# Patient Record
Sex: Male | Born: 1940 | Race: White | Hispanic: No | Marital: Married | Smoking: Current every day smoker
Health system: Southern US, Community
[De-identification: ages and names within clinical notes are randomized; demographics above are authoritative.]

## PROBLEM LIST (undated history)

## (undated) DIAGNOSIS — J984 Other disorders of lung: Secondary | ICD-10-CM

## (undated) DIAGNOSIS — Z720 Tobacco use: Secondary | ICD-10-CM

## (undated) DIAGNOSIS — S46009A Unspecified injury of muscle(s) and tendon(s) of the rotator cuff of unspecified shoulder, initial encounter: Secondary | ICD-10-CM

## (undated) DIAGNOSIS — M25519 Pain in unspecified shoulder: Secondary | ICD-10-CM

## (undated) DIAGNOSIS — L219 Seborrheic dermatitis, unspecified: Secondary | ICD-10-CM

## (undated) DIAGNOSIS — E875 Hyperkalemia: Secondary | ICD-10-CM

## (undated) DIAGNOSIS — N200 Calculus of kidney: Secondary | ICD-10-CM

## (undated) DIAGNOSIS — I10 Essential (primary) hypertension: Secondary | ICD-10-CM

## (undated) DIAGNOSIS — N529 Male erectile dysfunction, unspecified: Secondary | ICD-10-CM

## (undated) DIAGNOSIS — E785 Hyperlipidemia, unspecified: Secondary | ICD-10-CM

## (undated) DIAGNOSIS — J449 Chronic obstructive pulmonary disease, unspecified: Secondary | ICD-10-CM

## (undated) DIAGNOSIS — R911 Solitary pulmonary nodule: Secondary | ICD-10-CM

## (undated) DIAGNOSIS — M7500 Adhesive capsulitis of unspecified shoulder: Secondary | ICD-10-CM

## (undated) DIAGNOSIS — E119 Type 2 diabetes mellitus without complications: Secondary | ICD-10-CM

## (undated) DIAGNOSIS — I639 Cerebral infarction, unspecified: Secondary | ICD-10-CM

## (undated) HISTORY — DX: Hyperlipidemia, unspecified: E78.5

## (undated) HISTORY — PX: OTHER SURGICAL HISTORY: SHX169

## (undated) HISTORY — DX: Calculus of kidney: N20.0

## (undated) HISTORY — DX: Unspecified injury of muscle(s) and tendon(s) of the rotator cuff of unspecified shoulder, initial encounter: S46.009A

## (undated) HISTORY — DX: Solitary pulmonary nodule: R91.1

## (undated) HISTORY — DX: Type 2 diabetes mellitus without complications: E11.9

## (undated) HISTORY — DX: Seborrheic dermatitis, unspecified: L21.9

## (undated) HISTORY — DX: Chronic obstructive pulmonary disease, unspecified: J44.9

## (undated) HISTORY — DX: Adhesive capsulitis of unspecified shoulder: M75.00

## (undated) HISTORY — DX: Hyperkalemia: E87.5

## (undated) HISTORY — DX: Essential (primary) hypertension: I10

## (undated) HISTORY — DX: Other disorders of lung: J98.4

## (undated) HISTORY — DX: Male erectile dysfunction, unspecified: N52.9

## (undated) HISTORY — DX: Tobacco use: Z72.0

## (undated) HISTORY — DX: Pain in unspecified shoulder: M25.519

---

## 1994-11-28 HISTORY — PX: OTHER SURGICAL HISTORY: SHX169

## 1996-09-29 HISTORY — PX: CERVICAL DISCECTOMY: SHX98

## 2003-09-27 ENCOUNTER — Emergency Department (HOSPITAL_COMMUNITY): Admission: EM | Admit: 2003-09-27 | Discharge: 2003-09-28 | Payer: Self-pay | Admitting: Emergency Medicine

## 2005-01-29 ENCOUNTER — Ambulatory Visit: Payer: Self-pay | Admitting: Family Medicine

## 2005-02-14 ENCOUNTER — Ambulatory Visit: Payer: Self-pay | Admitting: Family Medicine

## 2006-05-27 ENCOUNTER — Ambulatory Visit: Payer: Self-pay | Admitting: Family Medicine

## 2006-06-10 ENCOUNTER — Ambulatory Visit: Payer: Self-pay | Admitting: Family Medicine

## 2006-06-24 ENCOUNTER — Ambulatory Visit: Payer: Self-pay | Admitting: Family Medicine

## 2006-06-25 ENCOUNTER — Ambulatory Visit: Payer: Self-pay | Admitting: Cardiology

## 2006-07-27 ENCOUNTER — Ambulatory Visit: Payer: Self-pay | Admitting: Family Medicine

## 2006-08-28 ENCOUNTER — Ambulatory Visit: Payer: Self-pay | Admitting: Family Medicine

## 2006-09-08 ENCOUNTER — Ambulatory Visit: Payer: Self-pay | Admitting: Family Medicine

## 2006-11-24 ENCOUNTER — Ambulatory Visit: Payer: Self-pay | Admitting: Family Medicine

## 2006-11-24 LAB — CONVERTED CEMR LAB
ALT: 18 units/L (ref 0–40)
AST: 20 units/L (ref 0–37)
Albumin: 4.4 g/dL (ref 3.5–5.2)
BUN: 13 mg/dL (ref 6–23)
CO2: 29 meq/L (ref 19–32)
Calcium: 9.4 mg/dL (ref 8.4–10.5)
Chloride: 98 meq/L (ref 96–112)
Cholesterol: 144 mg/dL (ref 0–200)
Creatinine, Ser: 1 mg/dL (ref 0.4–1.5)
GFR calc Af Amer: 96 mL/min
GFR calc non Af Amer: 79 mL/min
Glucose, Bld: 167 mg/dL — ABNORMAL HIGH (ref 70–99)
HDL: 45.8 mg/dL (ref >39.0)
Hgb A1c MFr Bld: 8.2 %
Hgb A1c MFr Bld: 8.2 % — ABNORMAL HIGH (ref 4.6–6.0)
LDL Cholesterol: 61 mg/dL (ref 0–99)
Phosphorus: 3 mg/dL (ref 2.3–4.6)
Potassium: 4.6 meq/L (ref 3.5–5.1)
Sodium: 138 meq/L (ref 135–145)
Total CHOL/HDL Ratio: 3.1
Triglycerides: 188 mg/dL — ABNORMAL HIGH (ref 0–149)
VLDL: 38 mg/dL (ref 0–40)

## 2007-01-05 ENCOUNTER — Encounter: Payer: Self-pay | Admitting: Family Medicine

## 2007-01-05 DIAGNOSIS — I10 Essential (primary) hypertension: Secondary | ICD-10-CM | POA: Insufficient documentation

## 2007-01-05 DIAGNOSIS — F528 Other sexual dysfunction not due to a substance or known physiological condition: Secondary | ICD-10-CM | POA: Insufficient documentation

## 2007-01-26 ENCOUNTER — Ambulatory Visit: Payer: Self-pay | Admitting: Family Medicine

## 2007-02-25 ENCOUNTER — Ambulatory Visit: Payer: Self-pay | Admitting: Family Medicine

## 2007-02-26 LAB — CONVERTED CEMR LAB
ALT: 20 units/L (ref 0–40)
AST: 22 units/L (ref 0–37)
Cholesterol: 144 mg/dL (ref 0–200)
Creatinine, Ser: 1 mg/dL (ref 0.4–1.5)
Direct LDL: 70.6 mg/dL
HDL: 37.9 mg/dL — ABNORMAL LOW (ref >39.0)
Hgb A1c MFr Bld: 7.6 % — ABNORMAL HIGH (ref 4.6–6.0)
Total CHOL/HDL Ratio: 3.8
Triglycerides: 229 mg/dL (ref 0–149)
VLDL: 46 mg/dL — ABNORMAL HIGH (ref 0–40)

## 2007-05-28 ENCOUNTER — Ambulatory Visit: Payer: Self-pay | Admitting: Family Medicine

## 2007-06-01 LAB — CONVERTED CEMR LAB
Albumin: 4.9 g/dL (ref 3.5–5.2)
BUN: 15 mg/dL (ref 6–23)
CO2: 24 meq/L (ref 19–32)
Calcium: 10 mg/dL (ref 8.4–10.5)
Chloride: 95 meq/L — ABNORMAL LOW (ref 96–112)
Creatinine, Ser: 0.98 mg/dL (ref 0.40–1.50)
Glucose, Bld: 110 mg/dL — ABNORMAL HIGH (ref 70–99)
Hgb A1c MFr Bld: 7 % — ABNORMAL HIGH (ref 4.6–6.1)
Phosphorus: 2.8 mg/dL (ref 2.3–4.6)
Potassium: 4.5 meq/L (ref 3.5–5.3)
Sodium: 135 meq/L (ref 135–145)

## 2007-08-30 ENCOUNTER — Ambulatory Visit: Payer: Self-pay | Admitting: Internal Medicine

## 2007-08-30 DIAGNOSIS — N209 Urinary calculus, unspecified: Secondary | ICD-10-CM | POA: Insufficient documentation

## 2007-08-30 LAB — CONVERTED CEMR LAB
Bilirubin Urine: NEGATIVE
Glucose, Urine, Semiquant: NEGATIVE
Nitrite: NEGATIVE
Protein, U semiquant: 30
Specific Gravity, Urine: >=1.03
Urobilinogen, UA: 0.2
WBC Urine, dipstick: NEGATIVE
pH: 5

## 2007-08-31 ENCOUNTER — Encounter: Payer: Self-pay | Admitting: Internal Medicine

## 2007-09-02 ENCOUNTER — Ambulatory Visit: Payer: Self-pay | Admitting: Cardiovascular Disease

## 2007-09-02 ENCOUNTER — Ambulatory Visit: Payer: Self-pay | Admitting: Family Medicine

## 2007-09-02 ENCOUNTER — Telehealth: Payer: Self-pay | Admitting: Family Medicine

## 2007-09-03 ENCOUNTER — Encounter: Payer: Self-pay | Admitting: Family Medicine

## 2007-09-07 ENCOUNTER — Encounter: Payer: Self-pay | Admitting: Family Medicine

## 2007-09-08 ENCOUNTER — Ambulatory Visit (HOSPITAL_COMMUNITY): Admission: RE | Admit: 2007-09-08 | Discharge: 2007-09-08 | Payer: Self-pay | Admitting: Urology

## 2007-09-22 ENCOUNTER — Ambulatory Visit (HOSPITAL_COMMUNITY): Admission: RE | Admit: 2007-09-22 | Discharge: 2007-09-22 | Payer: Self-pay | Admitting: Urology

## 2007-10-28 ENCOUNTER — Telehealth: Payer: Self-pay | Admitting: Family Medicine

## 2008-01-20 ENCOUNTER — Encounter: Payer: Self-pay | Admitting: Family Medicine

## 2008-01-21 ENCOUNTER — Encounter: Payer: Self-pay | Admitting: Family Medicine

## 2008-04-14 ENCOUNTER — Ambulatory Visit: Payer: Self-pay | Admitting: Family Medicine

## 2008-04-14 DIAGNOSIS — L219 Seborrheic dermatitis, unspecified: Secondary | ICD-10-CM | POA: Insufficient documentation

## 2008-04-17 ENCOUNTER — Encounter: Payer: Self-pay | Admitting: Family Medicine

## 2008-04-18 LAB — CONVERTED CEMR LAB
ALT: 17 units/L (ref 0–53)
AST: 18 units/L (ref 0–37)
Albumin: 4.8 g/dL (ref 3.5–5.2)
Alkaline Phosphatase: 69 units/L (ref 39–117)
BUN: 13 mg/dL (ref 6–23)
Basophils Absolute: 0 10*3/uL (ref 0.0–0.1)
Basophils Relative: 0 % (ref 0–1)
Bilirubin, Direct: 0.3 mg/dL (ref 0.0–0.3)
CO2: 21 meq/L (ref 19–32)
Calcium: 9.5 mg/dL (ref 8.4–10.5)
Chloride: 101 meq/L (ref 96–112)
Cholesterol: 142 mg/dL (ref 0–200)
Creatinine, Ser: 0.95 mg/dL (ref 0.40–1.50)
Eosinophils Absolute: 0.2 10*3/uL (ref 0.0–0.7)
Eosinophils Relative: 2 % (ref 0–5)
Glucose, Bld: 152 mg/dL — ABNORMAL HIGH (ref 70–99)
HCT: 49.9 % (ref 39.0–52.0)
HDL: 33.4 mg/dL — ABNORMAL LOW (ref >39.0)
Hemoglobin: 17.1 g/dL — ABNORMAL HIGH (ref 13.0–17.0)
Hgb A1c MFr Bld: 7.7 % — ABNORMAL HIGH (ref 4.6–6.0)
Indirect Bilirubin: 1.2 mg/dL — ABNORMAL HIGH (ref 0.0–0.9)
LDL Cholesterol: 72 mg/dL (ref 0–99)
Lymphocytes Relative: 20 % (ref 12–46)
Lymphs Abs: 1.9 10*3/uL (ref 0.7–4.0)
MCHC: 34.3 g/dL (ref 30.0–36.0)
MCV: 88 fL (ref 78.0–100.0)
Monocytes Absolute: 0.7 10*3/uL (ref 0.1–1.0)
Monocytes Relative: 7 % (ref 3–12)
Neutro Abs: 6.4 10*3/uL (ref 1.7–7.7)
Neutrophils Relative %: 70 % (ref 43–77)
Platelets: 221 10*3/uL (ref 150–400)
Potassium: 4.4 meq/L (ref 3.5–5.3)
RBC: 5.67 M/uL (ref 4.22–5.81)
RDW: 13.2 % (ref 11.5–15.5)
Sodium: 140 meq/L (ref 135–145)
TSH: 2.182 microintl units/mL (ref 0.350–4.50)
Total Bilirubin: 1.5 mg/dL — ABNORMAL HIGH (ref 0.3–1.2)
Total CHOL/HDL Ratio: 4.3
Total Protein: 7.5 g/dL (ref 6.0–8.3)
Triglycerides: 184 mg/dL — ABNORMAL HIGH (ref 0–149)
VLDL: 37 mg/dL (ref 0–40)
WBC: 9.1 10*3/uL (ref 4.0–10.5)

## 2008-05-18 ENCOUNTER — Ambulatory Visit: Payer: Self-pay | Admitting: Family Medicine

## 2008-09-27 ENCOUNTER — Telehealth: Payer: Self-pay | Admitting: Family Medicine

## 2008-11-06 ENCOUNTER — Telehealth: Payer: Self-pay | Admitting: Family Medicine

## 2008-11-15 ENCOUNTER — Telehealth: Payer: Self-pay | Admitting: Family Medicine

## 2008-11-16 ENCOUNTER — Ambulatory Visit: Payer: Self-pay | Admitting: Family Medicine

## 2008-11-21 LAB — CONVERTED CEMR LAB
ALT: 21 units/L (ref 0–53)
AST: 24 units/L (ref 0–37)
Albumin: 4.8 g/dL (ref 3.5–5.2)
Alkaline Phosphatase: 84 units/L (ref 39–117)
BUN: 16 mg/dL (ref 6–23)
Bilirubin, Direct: 0.3 mg/dL (ref 0.0–0.3)
CO2: 31 meq/L (ref 19–32)
Calcium: 10.2 mg/dL (ref 8.4–10.5)
Chloride: 97 meq/L (ref 96–112)
Creatinine, Ser: 1.2 mg/dL (ref 0.4–1.5)
GFR calc Af Amer: 77 mL/min
GFR calc non Af Amer: 64 mL/min
Glucose, Bld: 188 mg/dL — ABNORMAL HIGH (ref 70–99)
Hgb A1c MFr Bld: 7.3 % — ABNORMAL HIGH (ref 4.6–6.0)
Phosphorus: 3.7 mg/dL (ref 2.3–4.6)
Potassium: 5.4 meq/L — ABNORMAL HIGH (ref 3.5–5.1)
Sodium: 141 meq/L (ref 135–145)
Total Bilirubin: 2 mg/dL — ABNORMAL HIGH (ref 0.3–1.2)
Total Protein: 8.1 g/dL (ref 6.0–8.3)

## 2008-12-14 ENCOUNTER — Ambulatory Visit: Payer: Self-pay | Admitting: Family Medicine

## 2008-12-14 DIAGNOSIS — E875 Hyperkalemia: Secondary | ICD-10-CM | POA: Insufficient documentation

## 2008-12-15 LAB — CONVERTED CEMR LAB
ALT: 18 units/L (ref 0–53)
AST: 23 units/L (ref 0–37)
Albumin: 4.3 g/dL (ref 3.5–5.2)
Alkaline Phosphatase: 71 units/L (ref 39–117)
Bilirubin, Direct: 0.2 mg/dL (ref 0.0–0.3)
Potassium: 4.8 meq/L (ref 3.5–5.1)
Total Bilirubin: 1.2 mg/dL (ref 0.3–1.2)
Total Protein: 7 g/dL (ref 6.0–8.3)

## 2009-01-04 ENCOUNTER — Encounter: Payer: Self-pay | Admitting: Family Medicine

## 2009-01-18 ENCOUNTER — Ambulatory Visit (HOSPITAL_COMMUNITY): Admission: RE | Admit: 2009-01-18 | Discharge: 2009-01-18 | Payer: Self-pay | Admitting: Urology

## 2009-02-13 ENCOUNTER — Ambulatory Visit: Payer: Self-pay | Admitting: Family Medicine

## 2009-02-15 LAB — CONVERTED CEMR LAB
Creatinine,U: 287.3 mg/dL
Hgb A1c MFr Bld: 7.2 % — ABNORMAL HIGH (ref 4.6–6.5)
Microalb Creat Ratio: 22.6 mg/g (ref 0.0–30.0)
Microalb, Ur: 6.5 mg/dL — ABNORMAL HIGH (ref 0.0–1.9)
Potassium: 4.3 meq/L (ref 3.5–5.1)

## 2009-05-15 ENCOUNTER — Encounter: Payer: Self-pay | Admitting: Family Medicine

## 2009-05-18 ENCOUNTER — Ambulatory Visit (HOSPITAL_BASED_OUTPATIENT_CLINIC_OR_DEPARTMENT_OTHER): Admission: RE | Admit: 2009-05-18 | Discharge: 2009-05-18 | Payer: Self-pay | Admitting: Urology

## 2009-05-25 ENCOUNTER — Ambulatory Visit (HOSPITAL_COMMUNITY): Admission: RE | Admit: 2009-05-25 | Discharge: 2009-05-25 | Payer: Self-pay | Admitting: Urology

## 2009-09-18 ENCOUNTER — Ambulatory Visit: Payer: Self-pay | Admitting: Family Medicine

## 2009-09-19 ENCOUNTER — Ambulatory Visit: Payer: Self-pay | Admitting: Family Medicine

## 2009-09-24 LAB — CONVERTED CEMR LAB
ALT: 16 units/L (ref 0–53)
AST: 19 units/L (ref 0–37)
Albumin: 4.4 g/dL (ref 3.5–5.2)
BUN: 14 mg/dL (ref 6–23)
CO2: 28 meq/L (ref 19–32)
Calcium: 9.1 mg/dL (ref 8.4–10.5)
Chloride: 102 meq/L (ref 96–112)
Cholesterol: 147 mg/dL (ref 0–200)
Creatinine, Ser: 1 mg/dL (ref 0.4–1.5)
Direct LDL: 76.3 mg/dL
GFR calc non Af Amer: 78.69 mL/min (ref >60)
Glucose, Bld: 141 mg/dL — ABNORMAL HIGH (ref 70–99)
HDL: 38 mg/dL — ABNORMAL LOW (ref >39.00)
Hgb A1c MFr Bld: 7.3 % — ABNORMAL HIGH (ref 4.6–6.5)
Phosphorus: 3.3 mg/dL (ref 2.3–4.6)
Potassium: 4.6 meq/L (ref 3.5–5.1)
Sodium: 138 meq/L (ref 135–145)
Total CHOL/HDL Ratio: 4
Triglycerides: 248 mg/dL — ABNORMAL HIGH (ref 0.0–149.0)
VLDL: 49.6 mg/dL — ABNORMAL HIGH (ref 0.0–40.0)

## 2009-10-08 ENCOUNTER — Encounter: Payer: Self-pay | Admitting: Family Medicine

## 2009-11-01 ENCOUNTER — Ambulatory Visit: Payer: Self-pay | Admitting: Family Medicine

## 2009-11-26 ENCOUNTER — Encounter: Payer: Self-pay | Admitting: Family Medicine

## 2009-11-27 LAB — HM DIABETES EYE EXAM: HM Diabetic Eye Exam: NORMAL

## 2009-12-17 ENCOUNTER — Ambulatory Visit: Payer: Self-pay | Admitting: Family Medicine

## 2009-12-17 LAB — CONVERTED CEMR LAB
ALT: 20 units/L (ref 0–53)
AST: 19 units/L (ref 0–37)
Albumin: 4.3 g/dL (ref 3.5–5.2)
Alkaline Phosphatase: 61 units/L (ref 39–117)
BUN: 11 mg/dL (ref 6–23)
Bilirubin, Direct: 0.2 mg/dL (ref 0.0–0.3)
CO2: 32 meq/L (ref 19–32)
Calcium: 9.2 mg/dL (ref 8.4–10.5)
Chloride: 101 meq/L (ref 96–112)
Creatinine, Ser: 0.9 mg/dL (ref 0.4–1.5)
Creatinine,U: 249.3 mg/dL
GFR calc non Af Amer: 88.8 mL/min (ref >60)
Glucose, Bld: 117 mg/dL — ABNORMAL HIGH (ref 70–99)
Hgb A1c MFr Bld: 7.2 % — ABNORMAL HIGH (ref 4.6–6.5)
Microalb Creat Ratio: 25.3 mg/g (ref 0.0–30.0)
Microalb, Ur: 6.3 mg/dL — ABNORMAL HIGH (ref 0.0–1.9)
Phosphorus: 3.5 mg/dL (ref 2.3–4.6)
Potassium: 4.6 meq/L (ref 3.5–5.1)
Sodium: 141 meq/L (ref 135–145)
Total Bilirubin: 1.1 mg/dL (ref 0.3–1.2)
Total Protein: 7.4 g/dL (ref 6.0–8.3)

## 2009-12-19 ENCOUNTER — Ambulatory Visit: Payer: Self-pay | Admitting: Family Medicine

## 2009-12-31 ENCOUNTER — Ambulatory Visit: Payer: Self-pay | Admitting: Family Medicine

## 2010-01-07 ENCOUNTER — Encounter: Payer: Self-pay | Admitting: Family Medicine

## 2010-01-08 ENCOUNTER — Encounter: Admission: RE | Admit: 2010-01-08 | Discharge: 2010-01-08 | Payer: Self-pay | Admitting: Family Medicine

## 2010-01-16 ENCOUNTER — Encounter: Payer: Self-pay | Admitting: Family Medicine

## 2010-02-13 ENCOUNTER — Encounter: Payer: Self-pay | Admitting: Family Medicine

## 2010-03-01 ENCOUNTER — Ambulatory Visit: Payer: Self-pay | Admitting: Family Medicine

## 2010-03-05 LAB — CONVERTED CEMR LAB
ALT: 25 units/L (ref 0–53)
AST: 25 units/L (ref 0–37)
Albumin: 4.3 g/dL (ref 3.5–5.2)
BUN: 19 mg/dL (ref 6–23)
CO2: 31 meq/L (ref 19–32)
Calcium: 9.7 mg/dL (ref 8.4–10.5)
Chloride: 95 meq/L — ABNORMAL LOW (ref 96–112)
Cholesterol: 153 mg/dL (ref 0–200)
Creatinine, Ser: 1 mg/dL (ref 0.4–1.5)
Direct LDL: 81.4 mg/dL
GFR calc non Af Amer: 81.4 mL/min (ref >60)
Glucose, Bld: 168 mg/dL — ABNORMAL HIGH (ref 70–99)
HDL: 43.6 mg/dL (ref >39.00)
Hgb A1c MFr Bld: 7.3 % — ABNORMAL HIGH (ref 4.6–6.5)
Phosphorus: 3.6 mg/dL (ref 2.3–4.6)
Potassium: 4.9 meq/L (ref 3.5–5.1)
Sodium: 138 meq/L (ref 135–145)
Total CHOL/HDL Ratio: 4
Triglycerides: 240 mg/dL — ABNORMAL HIGH (ref 0.0–149.0)
VLDL: 48 mg/dL — ABNORMAL HIGH (ref 0.0–40.0)

## 2010-03-13 ENCOUNTER — Encounter: Payer: Self-pay | Admitting: Family Medicine

## 2010-03-28 ENCOUNTER — Encounter: Payer: Self-pay | Admitting: Family Medicine

## 2010-03-29 ENCOUNTER — Telehealth: Payer: Self-pay | Admitting: Family Medicine

## 2010-03-29 DIAGNOSIS — J449 Chronic obstructive pulmonary disease, unspecified: Secondary | ICD-10-CM | POA: Insufficient documentation

## 2010-04-19 ENCOUNTER — Encounter: Payer: Self-pay | Admitting: Family Medicine

## 2010-04-19 ENCOUNTER — Ambulatory Visit: Payer: Self-pay | Admitting: Pulmonary Disease

## 2010-04-23 DIAGNOSIS — J984 Other disorders of lung: Secondary | ICD-10-CM | POA: Insufficient documentation

## 2010-04-25 ENCOUNTER — Encounter: Payer: Self-pay | Admitting: Pulmonary Disease

## 2010-04-29 ENCOUNTER — Ambulatory Visit: Payer: Self-pay | Admitting: Cardiovascular Disease

## 2010-04-30 ENCOUNTER — Ambulatory Visit: Payer: Self-pay

## 2010-04-30 ENCOUNTER — Encounter: Payer: Self-pay | Admitting: Cardiovascular Disease

## 2010-04-30 ENCOUNTER — Ambulatory Visit (HOSPITAL_COMMUNITY): Admission: RE | Admit: 2010-04-30 | Discharge: 2010-04-30 | Payer: Self-pay | Admitting: Cardiovascular Disease

## 2010-04-30 ENCOUNTER — Ambulatory Visit: Payer: Self-pay | Admitting: Cardiology

## 2010-05-03 ENCOUNTER — Ambulatory Visit: Payer: Self-pay | Admitting: Pulmonary Disease

## 2010-05-07 ENCOUNTER — Ambulatory Visit: Payer: Self-pay | Admitting: Cardiology

## 2010-05-15 ENCOUNTER — Ambulatory Visit: Payer: Self-pay | Admitting: Pulmonary Disease

## 2010-06-07 ENCOUNTER — Telehealth: Payer: Self-pay | Admitting: Pulmonary Disease

## 2010-06-07 ENCOUNTER — Ambulatory Visit: Payer: Self-pay | Admitting: Family Medicine

## 2010-06-09 LAB — CONVERTED CEMR LAB
Albumin: 4.2 g/dL (ref 3.5–5.2)
BUN: 13 mg/dL (ref 6–23)
CO2: 28 meq/L (ref 19–32)
Calcium: 9.3 mg/dL (ref 8.4–10.5)
Chloride: 101 meq/L (ref 96–112)
Creatinine, Ser: 1.1 mg/dL (ref 0.4–1.5)
GFR calc non Af Amer: 74.23 mL/min (ref >60)
Glucose, Bld: 150 mg/dL — ABNORMAL HIGH (ref 70–99)
Hgb A1c MFr Bld: 8.3 % — ABNORMAL HIGH (ref 4.6–6.5)
Phosphorus: 3.2 mg/dL (ref 2.3–4.6)
Potassium: 4.8 meq/L (ref 3.5–5.1)
Sodium: 139 meq/L (ref 135–145)

## 2010-06-11 ENCOUNTER — Ambulatory Visit: Payer: Self-pay | Admitting: Family Medicine

## 2010-06-14 ENCOUNTER — Ambulatory Visit: Payer: Self-pay | Admitting: Pulmonary Disease

## 2010-06-24 ENCOUNTER — Encounter: Payer: Self-pay | Admitting: Pulmonary Disease

## 2010-07-02 ENCOUNTER — Ambulatory Visit (HOSPITAL_BASED_OUTPATIENT_CLINIC_OR_DEPARTMENT_OTHER): Admission: RE | Admit: 2010-07-02 | Discharge: 2010-07-02 | Payer: Self-pay | Admitting: Orthopaedic Surgery

## 2010-07-24 ENCOUNTER — Encounter: Payer: Self-pay | Admitting: Pulmonary Disease

## 2010-07-29 ENCOUNTER — Ambulatory Visit: Payer: Self-pay | Admitting: Pulmonary Disease

## 2010-08-28 ENCOUNTER — Encounter: Payer: Self-pay | Admitting: Pulmonary Disease

## 2010-09-09 ENCOUNTER — Ambulatory Visit: Payer: Self-pay | Admitting: Family Medicine

## 2010-09-09 LAB — CONVERTED CEMR LAB
Cholesterol, target level: 200 mg/dL
HDL goal, serum: 40 mg/dL
LDL Goal: 100 mg/dL

## 2010-09-10 LAB — CONVERTED CEMR LAB
Albumin: 4.1 g/dL (ref 3.5–5.2)
BUN: 15 mg/dL (ref 6–23)
CO2: 31 meq/L (ref 19–32)
Calcium: 9.2 mg/dL (ref 8.4–10.5)
Chloride: 99 meq/L (ref 96–112)
Creatinine, Ser: 1.2 mg/dL (ref 0.4–1.5)
GFR calc non Af Amer: 64.2 mL/min (ref >60.00)
Glucose, Bld: 183 mg/dL — ABNORMAL HIGH (ref 70–99)
Hgb A1c MFr Bld: 7.6 % — ABNORMAL HIGH (ref 4.6–6.5)
Phosphorus: 3.1 mg/dL (ref 2.3–4.6)
Potassium: 4.7 meq/L (ref 3.5–5.1)
Sodium: 139 meq/L (ref 135–145)

## 2010-10-02 ENCOUNTER — Encounter: Payer: Self-pay | Admitting: Pulmonary Disease

## 2010-10-28 ENCOUNTER — Ambulatory Visit
Admission: RE | Admit: 2010-10-28 | Discharge: 2010-10-28 | Payer: Self-pay | Source: Home / Self Care | Attending: Family Medicine | Admitting: Family Medicine

## 2010-10-28 LAB — HM DIABETES FOOT EXAM

## 2010-10-29 NOTE — Progress Notes (Signed)
Summary: Ok to stop spiriva  Phone Note Call from Patient Call back at [facility] Phone [phone]   Caller: Patient Call For: Vassie Loll Reason for Call: Talk to Nurse Summary of Call: switched Advair to Spiriva - pt says he can't really tell any difference. Initial call taken by: Eugene Gavia,  June 07, 2010 8:45 AM  Follow-up for Phone Call        Pt was advised at last OV to add spiriva  to his meds, and call back to let us know how it was doing. Pt states he does not see any difference in his breathing with the spiriva. Pt wants to know should he continue with it, or what does RA advise? Pt is also asking if Dr. Vassie Loll knows anything about pt surgery, he has not heard anything from surgeon yet.  Please advise. Carron Curie CMA  June 07, 2010 9:02 AM   Additional Follow-up for Phone Call Additional follow up Details #1::        OK to ct advair & stay off spiriva ct nebs as advised He should contac dr Jerl Santos regarding surgery - we have sent recommendations to him. FU in 2 months if surgery not done or 2 weeks after surgery Additional Follow-up by: Comer Locket. Vassie Loll MD,  June 09, 2010 10:15 PM    Additional Follow-up for Phone Call Additional follow up Details #2::    pt advised. Carron Curie CMA  June 10, 2010 9:09 AM
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
## 2010-10-29 NOTE — Letter (Signed)
Summary: [facility]  [facility]   Imported By: Lester Fruitvale 05/02/2010 09:54:13  _____________________________________________________________________  External Attachment:    Type:   Image     Comment:   External Document

## 2010-10-29 NOTE — Assessment & Plan Note (Signed)
Summary: rov 2 wks ///kp   Copy to:  Dr. Milinda Antis Primary Provider/Referring Provider:  [name]  CC:  Jonathan [name] .  History of Present Illness: 70/M, 70 PY smoker, diabetic, moderate COPD  for pre-op pulmonary clearance prior to shoulder surgery.  Surgery was called off when anesthetist noted wheezing. He underwent renal surgery in 12/10 uneventfully. He reports dyspne aon exertion - can walk in the parking lot but cannot climb stairs, occasional wheezing, cough with minimal white expectoration in am. He denies frequent chest colds. Spirometry showed moderate airway obstruction with FEv1 54%. CXR 8/10 showed hyperinflation. CT chest 12/08 showed 4mm RLL nodule that was stable since sep '07 -stable on FU 7/11. Proventil does not work - preferred old albuterol MDIs  May 03, 2010 9:29 AM  Much improved with low dose prednisone, albuterol nebs & advair Down to 5 cigs/ day, sugars did not go too high.  May 15, 2010 4:14 PM  Back to wheezing, increased 'congestion'  - advair helping, has not received date of surgery yet  Preventive Screening-Counseling & Management  Alcohol-Tobacco     Smoking Status: current     Packs/Day: 5 cigs daily     Year Started: 1957     Pack years: 8  Current Medications (verified): 1)  Lotrisone 1-0.05 % Lotn (Clotrimazole-Betamethasone) .... Apply 1 A Small Amount To Affected Area Once A Day As Needed 2)  Glucotrol Xl 10 Mg Tb24 (Glipizide) .[name].. 1 By Mouth Once Daily 3)  Levitra 10 Mg Tabs (Vardenafil Hcl) .... Take 1 Tablet By Mouth Once A Day As Needed Prior To Sexual Activity 4)  Metformin Hcl 1000 Mg Tabs (Metformin Hcl) .... Take 1 Tablet By Mouth in Am, 1/2 At Lunch and 1 in Evening 5)  Proventil Hfa 108 (90 Base) Mcg/act  Aers (Albuterol Sulfate) .... 2 Puffs Q 4 Hours As Needed Wheeze 6)  Lipitor 10 Mg Tabs (Atorvastatin Calcium) .[name].. 1 By Mouth Once Daily 7)  Nasonex 50 Mcg/act Susp (Mometasone Furoate) .... 2 Sprays in Each Nostril Once  Daily 8)  Norvasc 5 Mg Tabs (Amlodipine Besylate) .[name].. 1 By Mouth Once Daily 9)  Freestyle Test   Strp (Glucose Blood) .... Use As Directed 10)  Ketoconazole 2 %  Crea (Ketoconazole) .... Apply To Affected Area Daily in Small Amount As Needed 11)  Zyban 150 Mg Xr12h-Tab (Bupropion Hcl (Smoking Deter)) .[name].. 1 By Mouth Once Daily For 7 Days and Then Two Times A Day------Hasn't Started This Med Yet 12)  Tramadol Hcl 50 Mg Tabs (Tramadol Hcl) .[name].. 1 By Mouth 4 Times Daily As Needed Pain 13)  Diclofenac Sodium 75 Mg Tbec (Diclofenac Sodium) .... Take One Tablet By Mouth Two Times A Day As Needed 14)  Hydrocodone-Acetaminophen 10-325 Mg Tabs (Hydrocodone-Acetaminophen) .... Take One Tablet Twice A Day As Needed 15)  Advair Diskus 250-50 Mcg/dose Aepb (Fluticasone-Salmeterol) .... Two Times A Day 16)  [facility] Nts Step 1 21 Mg/24hr Pt24 (Nicotine) .... As Directed 17)  Albuterol Sulfate (2.5 Mg/70ml) 0.083%  Nebu (Albuterol Sulfate) .[name].. 1 Vial in  Nebulizer Two Times A Day As Needed  Allergies (verified): 1)  ! Augmentin 2)  ! Accupril 3)  ! Diovan 4)  ! * Chantix  Past History:  Past Medical History: Last updated: 04/25/2010  HYPERTENSION HYPERLIPIDEMIA  PULMONARY NODULE, RIGHT LOWER LOBE COPD  ROTATOR CUFF INJURY, RIGHT SHOULDER  FROZEN SHOULDER  SHOULDER PAIN HYPERKALEMIA DERMATITIS, SEBORRHEIC  OTHER DISEASES OF LUNG NOT ELSEWHERE CLASSIFIED  KIDNEY STONE ALLERGIC  RHINITIS  ERECTILE DYSFUNCTION DIABETES MELLITUS, TYPE II  ASTHMA smoker - copd  Social History: Last updated: 05/03/2010 Jonathan [name] refuses flu and pneumovax "don't ask again" Patient is smoker- formerly more heavy  quit alcohol 40 years ago  retired  travels frequently to [employer] [place] [employer] Patient is a current smoker. prev smoked 1 1/2 ppd. currently smokes 5 cigs daily. Retired Midwife  Review of Systems       The patient complains of dyspnea on exertion.  The patient denies anorexia, fever, weight loss, weight  gain, vision loss, decreased hearing, hoarseness, chest pain, syncope, peripheral edema, prolonged cough, headaches, hemoptysis, abdominal pain, melena, hematochezia, severe indigestion/heartburn, hematuria, muscle weakness, suspicious skin lesions, difficulty walking, depression, unusual weight change, and abnormal bleeding.    Vital Signs:  Patient profile:   70 year old male Height:      66 inches Weight:      193.25 pounds BMI:     31.30 O2 Sat:      91 % on Room air Temp:     98.0 degrees F oral Pulse rate:   83 / minute BP sitting:   118 / 58  (right arm) Cuff size:   regular  Vitals Entered By: Zackery Barefoot CMA (May 15, 2010 4:02 PM)  O2 Flow:  Room air CC: Jonathan [name]  Comments Medications reviewed with patient Verified contact number and pharmacy with patient Zackery Barefoot CMA  May 15, 2010 4:02 PM    Physical Exam  Additional Exam:  pleasant HEENT - no thrush, no post nasal drip no lymphadenopathy CVS- s1s2 nml, no murmur, no JVD RS- coarse BS, no crackles, faint rhonchi Abd- soft, non-tender, no organomegaly Ext - no clubbing, cyanosis or edema    Impression & Recommendations:  Problem # 1:  COPD (ICD-496)  Take prednisone 10 mg 3 days before & 3 ds after surgery Take nebuliser at least once daily until surgery . Take advair two times a day  Proventil upto 4 times/ day 2 puffs as needed  He is at mild risk for post -operative pulmonary complications with general anesthesia Add spiriva - if no bette rin 10 ds, consider adding low dose prednisone daily until surgery  Problem # 2:  PULMONARY NODULE, RIGHT LOWER LOBE (ICD-518.89)  stable, no FU required  Orders: Est. Patient Level III ([address])  Patient Instructions: 1)  Copy sent to: Dr Jerl Santos 2)  Please schedule a follow-up appointment in 2 weeks after surgery 3)  Trial of spiriva 4)  Call me at 10 days to tell me if this is working

## 2010-10-29 NOTE — Letter (Signed)
Summary: [facility]  [facility]   Imported By: Lanelle Bal 04/16/2010 10:45:09  _____________________________________________________________________  External Attachment:    Type:   Image     Comment:   External Document

## 2010-10-29 NOTE — Letter (Signed)
Summary: [facility]  [facility]   Imported By: Esmeralda Links D'jimraou 07/20/2010 11:55:06  _____________________________________________________________________  External Attachment:    Type:   Image     Comment:   External Document

## 2010-10-29 NOTE — Letter (Signed)
Summary: [facility]  [facility]   Imported By: Sherian Rein 08/12/2010 08:03:26  _____________________________________________________________________  External Attachment:    Type:   Image     Comment:   External Document

## 2010-10-29 NOTE — Letter (Signed)
Summary: [facility]  [facility]   Imported By: Lanelle Bal 04/16/2010 10:45:58  _____________________________________________________________________  External Attachment:    Type:   Image     Comment:   External Document

## 2010-10-29 NOTE — Assessment & Plan Note (Signed)
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  REVIEW OF SYSTEMS  GEN: No systemic complaints, no fevers, chills, sweats, or other acute illnesses MSK: Detailed in the HPI GI: tolerating PO intake without difficulty Neuro: No numbness, parasthesias, or tingling associated. Otherwise the pertinent positives of the ROS are noted above.    GEN: Well-developed,well-nourished,in no acute distress; alert,appropriate and cooperative throughout examination HEENT: Normocephalic and atraumatic without obvious abnormalities. No apparent alopecia or balding. Ears, externally no deformities PULM: Breathing comfortably in no respiratory distress EXT: No clubbing, cyanosis, or edema PSYCH: Normally interactive. Cooperative during the interview. Pleasant. Friendly and conversant. Not anxious or depressed appearing. Normal, full affect.   right shoulder: Flexion to  175, abduction to 170, internal rotation 90.  External rotation, lack of 30.  Left shoulder: Flexion to 175-180, abduction to 175, internal rotation and 95, external rotation, lack of approximately 20.  Strength testing is intact, there is some pain of the supraspinatus insertion. There is tenderness with KENNEDY test and near test.  Allergies: 1)  ! Augmentin 2)  ! Accupril 3)  ! Diovan 4)  ! *  Chantix   Impression & Recommendations:  Problem # 1:  FROZEN SHOULDER (ICD-726.0) and rtc tendinopathy  much better, c/w PT 2 weeks, then c/w HEP - will need long term rehab given inactivity.  impingement still present, but he is drastically improved  Complete Medication List: 1)  Lotrisone 1-0.05 % Lotn (Clotrimazole-betamethasone) .... Apply 1 a small amount to affected area once a day 2)  Glucotrol Xl 10 Mg Tb24 (Glipizide) .[name].. 1 by mouth once daily 3)  Levitra 10 Mg Tabs (Vardenafil hcl) .... Take 1 tablet by mouth once a day as needed prior to sexual activity 4)  Metformin Hcl 1000 Mg Tabs (Metformin hcl) .... Take 1 tablet by mouth in am, 1/2 at lunch and 1 in evening 5)  Proventil Hfa 108 (90 Base) Mcg/act Aers (Albuterol sulfate) .... 2 puffs q 4 hours as needed wheeze 6)  Lipitor 10 Mg Tabs (Atorvastatin calcium) .[name].. 1 by mouth once daily 7)  Nasonex 50 Mcg/act Susp (Mometasone furoate) .... 2 sprays in each nostril once daily 8)  Norvasc 5 Mg Tabs (Amlodipine besylate) .[name].. 1 by mouth once daily 9)  Freestyle Test Strp (Glucose blood) .... Use as directed 10)  Ketoconazole 2 % Crea (Ketoconazole) .... Apply to affected area daily in small amount 11)  Zyban 150 Mg Xr12h-tab (Bupropion hcl (smoking deter)) .[name].. 1 by mouth once daily for 7 days and then two times a day 12)  Tramadol Hcl 50 Mg Tabs (Tramadol hcl) .[name].. 1 by mouth 4 times daily as needed pain Prescriptions: TRAMADOL HCL 50 MG TABS (TRAMADOL HCL) 1 by  mouth 4 times daily as needed pain  #40 x 1   Entered and Authorized by:   Hannah Beat MD   Signed by:   Hannah Beat MD on 11/01/2009   Method used:   Print then Give to Patient   RxID:   [id number]   Current Allergies (reviewed today): ! AUGMENTIN ! ACCUPRIL ! DIOVAN ! * CHANTIX
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
## 2010-10-29 NOTE — Assessment & Plan Note (Signed)
Summary: 6 weeks/ mbw   Visit Type:  Follow-up Copy to:  Dr. Milinda Antis Primary Provider/Referring Provider:  Colon Flattery Tower MD  CC:  Pt here for follow up. Pt states did not feel a difference with Spiriva.  History of Present Illness: 70/M, 55 PY smoker, diabetic, moderate COPD  for pre-op pulmonary clearance prior to shoulder surgery.  Surgery was called off when anesthetist noted wheezing. He underwent renal surgery in 12/10 uneventfully. He reports dyspne aon exertion - can walk in the parking lot but cannot climb stairs, occasional wheezing, cough with minimal white expectoration in am. He denies frequent chest colds. Spirometry showed moderate airway obstruction with FEv1 54%. CXR 8/10 showed hyperinflation. CT chest 12/08 showed 4mm RLL nodule that was stable since sep '07 -stable on FU 7/11. Proventil does not work - preferred old albuterol MDIs  May 03, 2010 9:29 AM  Much improved with low dose prednisone, albuterol nebs & advair Down to 5 cigs/ day, sugars did not go too high.  May 15, 2010 4:14 PM  Back to wheezing, increased 'congestion'  - advair helping, has not received date of surgery yet  June 14, 2010 2:24 PM  spiriva not helpful - expensive. Continues to smoke 1 PPD. Will be seeing dr Jerl Santos son to schedule surgery. Less wheezing, wants handicapped card .  Preventive Screening-Counseling & Management  Alcohol-Tobacco     Smoking Status: current     Packs/Day: 5 cigs daily     Year Started: 1957     Pack years: 70  Current Medications (verified): 1)  Lotrisone 1-0.05 % Lotn (Clotrimazole-Betamethasone) .... Apply 1 A Small Amount To Affected Area Once A Day As Needed 2)  Glucotrol Xl 10 Mg Tb24 (Glipizide) .[name].. 1 By Mouth Once Daily 3)  Levitra 10 Mg Tabs (Vardenafil Hcl) .... Take 1 Tablet By Mouth Once A Day As Needed Prior To Sexual Activity 4)  Metformin Hcl 1000 Mg Tabs (Metformin Hcl) .... Take 1 Tablet By Mouth in Am, 1/2 At Lunch and 1 in  Evening 5)  Proventil Hfa 108 (90 Base) Mcg/act  Aers (Albuterol Sulfate) .... 2 Puffs Q 4 Hours As Needed Wheeze 6)  Lipitor 10 Mg Tabs (Atorvastatin Calcium) .[name].. 1 By Mouth Once Daily 7)  Nasonex 50 Mcg/act Susp (Mometasone Furoate) .... 2 Sprays in Each Nostril Once Daily 8)  Norvasc 5 Mg Tabs (Amlodipine Besylate) .[name].. 1 By Mouth Once Daily 9)  Freestyle Test   Strp (Glucose Blood) .... Use As Directed 10)  Ketoconazole 2 %  Crea (Ketoconazole) .... Apply To Affected Area Daily in Small Amount As Needed 11)  Zyban 150 Mg Xr12h-Tab (Bupropion Hcl (Smoking Deter)) .[name].. 1 By Mouth Once Daily For 7 Days and Then Two Times A Day------Hasn't Started This Med Yet 12)  Tramadol Hcl 50 Mg Tabs (Tramadol Hcl) .[name].. 1 By Mouth 4 Times Daily As Needed Pain 13)  Diclofenac Sodium 75 Mg Tbec (Diclofenac Sodium) .... Take One Tablet By Mouth Two Times A Day As Needed 14)  Hydrocodone-Acetaminophen 10-325 Mg Tabs (Hydrocodone-Acetaminophen) .... Take One Tablet Twice A Day As Needed 15)  [facility]vs Nts Step 1 21 Mg/24hr Pt24 (Nicotine) .... As Directed 16)  Albuterol Sulfate (2.5 Mg/64ml) 0.083%  Nebu (Albuterol Sulfate) .[name].. 1 Vial in  Nebulizer Two Times A Day As Needed 17)  Lantus Solostar 100 Unit/ml Soln (Insulin Glargine) .... Inject 10 Units Each Evening As Directed 18)  Glucose Test Strips and Lancets .... To Check Sugar Two Times  A Day and As Needed For Dm 250.0 19)  Needles For Lantus Pen .... To Inject Daily As Directed  Allergies (verified): 1)  ! Augmentin 2)  ! Accupril 3)  ! Diovan 4)  ! * Chantix  Past History:  Past Medical History: Last updated: 04/25/2010  HYPERTENSION HYPERLIPIDEMIA  PULMONARY NODULE, RIGHT LOWER LOBE COPD  ROTATOR CUFF INJURY, RIGHT SHOULDER  FROZEN SHOULDER  SHOULDER PAIN HYPERKALEMIA DERMATITIS, SEBORRHEIC  OTHER DISEASES OF LUNG NOT ELSEWHERE CLASSIFIED  KIDNEY STONE ALLERGIC  RHINITIS  ERECTILE DYSFUNCTION DIABETES MELLITUS, TYPE II  ASTHMA smoker -  copd  Social History: Last updated: 05/03/2010 pt refuses flu and pneumovax "don't ask again" Patient is smoker- formerly more heavy  quit alcohol 40 years ago  retired  travels frequently to [place] Patient is a current smoker. prev smoked 1 1/2 ppd. currently smokes 5 cigs daily. Retired Midwife  Review of Systems       The patient complains of dyspnea on exertion.  The patient denies anorexia, fever, weight loss, weight gain, vision loss, decreased hearing, hoarseness, chest pain, syncope, peripheral edema, prolonged cough, headaches, hemoptysis, abdominal pain, melena, hematochezia, severe indigestion/heartburn, hematuria, muscle weakness, suspicious skin lesions, difficulty walking, depression, unusual weight change, abnormal bleeding, and enlarged lymph nodes.    Vital Signs:  Patient profile:   70 year old male Height:      66 inches Weight:      192.38 pounds BMI:     31.16 O2 Sat:      97 % on Room air Temp:     97.4 degrees F oral Pulse rate:   101 / minute BP sitting:   140 / 80  (left arm) Cuff size:   regular  Vitals Entered By: Zackery Barefoot CMA (June 14, 2010 2:02 PM)  O2 Flow:  Room air CC: Pt here for follow up. Pt states did not feel a difference with Spiriva Comments Medications reviewed with patient Verified contact number and pharmacy with patient Zackery Barefoot CMA  June 14, 2010 2:03 PM    Physical Exam  Additional Exam:  pleasant HEENT - no thrush, no post nasal drip no lymphadenopathy CVS- s1s2 nml, no murmur, no JVD RS- coarse BS, no crackles, decreased BS BL bases, no rhonchi Abd- soft, non-tender, no organomegaly Ext - no clubbing, cyanosis or edema    Impression & Recommendations:  Problem # 1:  COPD (ICD-496) ct advair two times a day  Take prednisone 10 mg 7 days prior to surgery & 3 days after Take albuterol nebs once daily 7 ds prior to surgery - can use more often if wheezing  Problem # 2:  TOBACCO  ABUSE, HX OF (ICD-V15.82)  Has tried smoking aids without . Emphasise on each visit & encouraged to set quit date  Orders: Est. Patient Level III (84132)  Patient Instructions: 1)  Copy sent to: Dr Jerl Santos 2)  Please schedule a follow-up appointment in 6 weeks with TP 3)  Take prednisone 10 mg 7 days prior to surgery & 3 days after 4)  Take albuterol nebs once daily 7 ds prior to surgery 5)  SET A DATE to quit smoking !!

## 2010-10-29 NOTE — Progress Notes (Signed)
  Phone Note From Other Clinic   Summary of Call: call from anesthesiologist at [facility] pt is wheezing with poor breath sounds - got a breathing tx  was getting ready for shoulder surg  given his risk factors and copd she feels he needs cardiol and pulm consults before considering surgery  Follow-up for Phone Call        I spoke with her- disc fact that this wheezing is his baseline and he is a smoker  he does have cardiac risk factors  he has been hesitant to see pulm in past  I will go ahead and do ref to pulm and cardiol and route to Shirlee Limerick pt will call on 7/1 to get appts set up Follow-up by: [name],  March 29, 2010 7:57 AM  New Problems: COPD (ICD-496)   New Problems: COPD (ICD-496)
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
## 2010-10-29 NOTE — Miscellaneous (Signed)
Summary: PT Discharge/[facility]  PT Discharge/[facility]   Imported By: Lanelle Bal 11/28/2009 11:29:41  _____________________________________________________________________  External Attachment:    Type:   Image     Comment:   External Document

## 2010-10-29 NOTE — Letter (Signed)
Summary: [facility]  [facility]   Imported By: Lanelle Bal 04/16/2010 10:44:21  _____________________________________________________________________  External Attachment:    Type:   Image     Comment:   External Document
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
## 2010-10-29 NOTE — Assessment & Plan Note (Signed)
Summary: rov 6 wks ///kp   Visit Type:  Follow-up Copy to:  Dr. Milinda Antis Primary Provider/Referring Provider:  Colon Flattery Tower MD  CC:  6 week follow up. Breathing is the same, c/o PND, and denies wheezing. quit date 07/30/10.  History of Present Illness: 69/M, 55 PY smoker, diabetic, moderate COPD  for FU Initially seen for  pre-op pulmonary clearance prior to shoulder surgery.  Surgery was called off when anesthetist noted wheezing. He underwent renal surgery in 12/10 uneventfully. Spirometry showed moderate airway obstruction with FEv1 54%. CXR 8/10 showed hyperinflation. CT chest 12/08 showed 4mm RLL nodule that was stable since sep '07 -stable on FU 7/11. Proventil does not work - preferred old albuterol MDIs. Advair helped, spiriva not helpful - expensive. Given handicapped card .  May 03, 2010 9:29 AM  Much improved with low dose prednisone, albuterol nebs & advair Down to 5 cigs/ day, sugars did not go too high.  July 29, 2010 2:48 PM  Uneventful surgery, uses rescue MDI hardly once/wk, quit date tomorrow. Does not want supplements  Preventive Screening-Counseling & Management  Alcohol-Tobacco     Smoking Status: current     Packs/Day: 5 cigs daily     Year Started: 1957     Pack years: 68  Current Medications (verified): 1)  Glucotrol Xl 10 Mg Tb24 (Glipizide) .[name].. 1 By Mouth Once Daily 2)  Levitra 10 Mg Tabs (Vardenafil Hcl) .... Take 1 Tablet By Mouth Once A Day As Needed Prior To Sexual Activity 3)  Metformin Hcl 1000 Mg Tabs (Metformin Hcl) .... Take 1 Tablet By Mouth in Am, 1/2 At Lunch and 1 in Evening 4)  Proventil Hfa 108 (90 Base) Mcg/act  Aers (Albuterol Sulfate) .... 2 Puffs Q 4 Hours As Needed Wheeze 5)  Lipitor 10 Mg Tabs (Atorvastatin Calcium) .[name].. 1 By Mouth Once Daily 6)  Nasonex 50 Mcg/act Susp (Mometasone Furoate) .... 2 Sprays in Each Nostril Once Daily As Needed. 7)  Norvasc 5 Mg Tabs (Amlodipine Besylate) .[name].. 1 By Mouth Once Daily 8)   Freestyle Test   Strp (Glucose Blood) .... Use As Directed 9)  Hydrocodone-Acetaminophen 10-325 Mg Tabs (Hydrocodone-Acetaminophen) .... **********hold**********take One Tablet Twice A Day As Needed 10)  Albuterol Sulfate (2.5 Mg/68ml) 0.083%  Nebu (Albuterol Sulfate) .[name].. 1 Vial in  Nebulizer Two Times A Day As Needed 11)  Advair Diskus 250-50 Mcg/dose Aepb (Fluticasone-Salmeterol) .... Inhale 1 Puff Two Times A Day 12)  Lantus Solostar 100 Unit/ml Soln (Insulin Glargine) .... Inject 10 Units Each Evening As Directed 13)  Glucose Test Strips and Lancets .... To Check Sugar Two Times A Day and As Needed For Dm 250.0 14)  Needles For Lantus Pen .... To Inject Daily As Directed 15)  Oxycodone-Acetaminophen 5-325 Mg Tabs (Oxycodone-Acetaminophen) .... Take 1-2 Tablet By Mouth Every 4-6 Hours As Needed For Pain  Allergies (verified): 1)  ! Augmentin 2)  ! Accupril 3)  ! Diovan 4)  ! * Chantix  Past History:  Past Medical History: Last updated: 04/25/2010  HYPERTENSION HYPERLIPIDEMIA  PULMONARY NODULE, RIGHT LOWER LOBE COPD  ROTATOR CUFF INJURY, RIGHT SHOULDER  FROZEN SHOULDER  SHOULDER PAIN HYPERKALEMIA DERMATITIS, SEBORRHEIC  OTHER DISEASES OF LUNG NOT ELSEWHERE CLASSIFIED  KIDNEY STONE ALLERGIC  RHINITIS  ERECTILE DYSFUNCTION DIABETES MELLITUS, TYPE II  ASTHMA smoker - copd  Social History: Last updated: 05/03/2010 pt refuses flu and pneumovax "don't ask again" Patient is smoker- formerly more heavy  quit alcohol 40 years ago  retired  [place] Patient is a current smoker. prev smoked 1 1/2 ppd. currently smokes 5 cigs daily. Retired Midwife  Review of Systems       The patient complains of dyspnea on exertion.  The patient denies anorexia, fever, weight loss, weight gain, vision loss, decreased hearing, hoarseness, chest pain, syncope, peripheral edema, prolonged cough, headaches, hemoptysis, abdominal pain, melena, hematochezia, severe  indigestion/heartburn, hematuria, muscle weakness, suspicious skin lesions, transient blindness, difficulty walking, depression, unusual weight change, abnormal bleeding, enlarged lymph nodes, and angioedema.    Vital Signs:  Patient profile:   70 year old male Height:      66 inches Weight:      194.4 pounds BMI:     31.49 O2 Sat:      94 % on Room air Temp:     98.0 degrees F oral Pulse rate:   90 / minute BP sitting:   120 / 70  (right arm) Cuff size:   regular  Vitals Entered By: Zackery Barefoot CMA (July 29, 2010 2:30 PM)  O2 Flow:  Room air CC: 6 week follow up. Breathing is the same, c/o PND, denies wheezing. quit date 07/30/10 Comments Medications reviewed with patient Verified contact number and pharmacy with patient Zackery Barefoot CMA  July 29, 2010 2:31 PM    Physical Exam  Additional Exam:  pleasant HEENT - no thrush, no post nasal drip no lymphadenopathy CVS- s1s2 nml, no murmur, no JVD RS- coarse BS, no crackles, decreased BS BL bases, no rhonchi Abd- soft, non-tender, no organomegaly Ext - no clubbing, cyanosis or edema    Impression & Recommendations:  Problem # 1:  COPD (ICD-496) Stable on regimen of advair & albuterol as needed   Problem # 2:  TOBACCO ABUSE, HX OF (ICD-V15.82)  Has set quit date , does not want supplements  Orders: Est. Patient Level III (16109)  Problem # 3:  PULMONARY NODULE, RIGHT LOWER LOBE (ICD-518.89)  no Fu required  Orders: Est. Patient Level III (60454)  Medications Added to Medication List This Visit: 1)  Hydrocodone-acetaminophen 10-325 Mg Tabs (Hydrocodone-acetaminophen) .... **********hold**********take one tablet twice a day as needed 2)  Advair Diskus 250-50 Mcg/dose Aepb (Fluticasone-salmeterol) .... Inhale 1 puff two times a day 3)  Oxycodone-acetaminophen 5-325 Mg Tabs (Oxycodone-acetaminophen) .... Take 1-2 tablet by mouth every 4-6 hours as needed for pain  Patient Instructions: 1)  Copy sent  to: Dr Milinda Antis 2)  Please schedule a follow-up appointment in 4 months with TP 3)  Let me know in future about a pulmonary rehab program 4)  Good luck with quit attempt   Not Administered:    Influenza Vaccine not given due to: declined

## 2010-10-29 NOTE — Letter (Signed)
Summary: [facility]  [facility]   Imported By: Lanelle Bal 01/22/2010 09:14:03  _____________________________________________________________________  External Attachment:    Type:   Image     Comment:   External Document

## 2010-10-29 NOTE — Miscellaneous (Signed)
Summary: Controlled Substance Agreement  Controlled Substance Agreement   Imported By: Lanelle Bal 03/08/2010 08:17:25  _____________________________________________________________________  External Attachment:    Type:   Image     Comment:   External Document

## 2010-10-29 NOTE — Consult Note (Signed)
Summary: [facility]  [facility]   Imported By: Lanelle Bal 04/16/2010 10:42:38  _____________________________________________________________________  External Attachment:    Type:   Image     Comment:   External Document
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
## 2010-10-29 NOTE — Miscellaneous (Signed)
Summary: PT Initial Eval/[facility]  PT Initial Eval/[facility]   Imported By: Lanelle Bal 10/12/2009 13:19:06  _____________________________________________________________________  External Attachment:    Type:   Image     Comment:   External Document
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
## 2010-10-31 NOTE — Letter (Signed)
Summary: [facility]  [facility]   Imported By: Sherian Rein 10/10/2010 10:47:10  _____________________________________________________________________  External Attachment:    Type:   Image     Comment:   External Document

## 2010-11-01 NOTE — Letter (Signed)
Summary: [facility]  [facility]   Imported By: Sherian Rein 09/18/2010 09:33:51  _____________________________________________________________________  External Attachment:    Type:   Image     Comment:   External Document
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
## 2010-11-26 ENCOUNTER — Ambulatory Visit (INDEPENDENT_AMBULATORY_CARE_PROVIDER_SITE_OTHER): Payer: Medicare Other | Admitting: Adult Health

## 2010-11-26 ENCOUNTER — Encounter: Payer: Self-pay | Admitting: Adult Health

## 2010-11-26 DIAGNOSIS — J449 Chronic obstructive pulmonary disease, unspecified: Secondary | ICD-10-CM

## 2010-12-03 ENCOUNTER — Encounter: Payer: Self-pay | Admitting: Family Medicine

## 2010-12-03 DIAGNOSIS — J45909 Unspecified asthma, uncomplicated: Secondary | ICD-10-CM | POA: Insufficient documentation

## 2010-12-03 DIAGNOSIS — E785 Hyperlipidemia, unspecified: Secondary | ICD-10-CM | POA: Insufficient documentation

## 2010-12-03 DIAGNOSIS — E119 Type 2 diabetes mellitus without complications: Secondary | ICD-10-CM

## 2010-12-03 DIAGNOSIS — S46009A Unspecified injury of muscle(s) and tendon(s) of the rotator cuff of unspecified shoulder, initial encounter: Secondary | ICD-10-CM | POA: Insufficient documentation

## 2010-12-03 DIAGNOSIS — I10 Essential (primary) hypertension: Secondary | ICD-10-CM

## 2010-12-03 DIAGNOSIS — M7500 Adhesive capsulitis of unspecified shoulder: Secondary | ICD-10-CM

## 2010-12-03 DIAGNOSIS — Z720 Tobacco use: Secondary | ICD-10-CM | POA: Insufficient documentation

## 2010-12-03 DIAGNOSIS — N529 Male erectile dysfunction, unspecified: Secondary | ICD-10-CM

## 2010-12-03 DIAGNOSIS — E875 Hyperkalemia: Secondary | ICD-10-CM

## 2010-12-03 DIAGNOSIS — M25519 Pain in unspecified shoulder: Secondary | ICD-10-CM | POA: Insufficient documentation

## 2010-12-03 DIAGNOSIS — J449 Chronic obstructive pulmonary disease, unspecified: Secondary | ICD-10-CM

## 2010-12-03 DIAGNOSIS — J309 Allergic rhinitis, unspecified: Secondary | ICD-10-CM | POA: Insufficient documentation

## 2010-12-03 DIAGNOSIS — N200 Calculus of kidney: Secondary | ICD-10-CM

## 2010-12-03 DIAGNOSIS — R911 Solitary pulmonary nodule: Secondary | ICD-10-CM | POA: Insufficient documentation

## 2010-12-03 DIAGNOSIS — E1165 Type 2 diabetes mellitus with hyperglycemia: Secondary | ICD-10-CM | POA: Insufficient documentation

## 2010-12-03 DIAGNOSIS — L219 Seborrheic dermatitis, unspecified: Secondary | ICD-10-CM | POA: Insufficient documentation

## 2010-12-03 DIAGNOSIS — E1169 Type 2 diabetes mellitus with other specified complication: Secondary | ICD-10-CM | POA: Insufficient documentation
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
## 2010-12-11 LAB — POCT I-STAT, CHEM 8
BUN: 19 mg/dL (ref 6–23)
Calcium, Ion: 1.13 mmol/L (ref 1.12–1.32)
Chloride: 102 mEq/L (ref 96–112)
Creatinine, Ser: 0.7 mg/dL (ref 0.4–1.5)
Glucose, Bld: 151 mg/dL — ABNORMAL HIGH (ref 70–99)
HCT: 56 % — ABNORMAL HIGH (ref 39.0–52.0)
Hemoglobin: 19 g/dL — ABNORMAL HIGH (ref 13.0–17.0)
Potassium: 4 mEq/L (ref 3.5–5.1)
Sodium: 138 mEq/L (ref 135–145)
TCO2: 25 mmol/L (ref 0–100)

## 2010-12-11 LAB — GLUCOSE, CAPILLARY: Glucose-Capillary: 155 mg/dL — ABNORMAL HIGH (ref 70–99)

## 2011-01-04 LAB — URINALYSIS, ROUTINE W REFLEX MICROSCOPIC
Bilirubin Urine: NEGATIVE
Glucose, UA: >1000 mg/dL — AB
Glucose, UA: NEGATIVE mg/dL
Hgb urine dipstick: NEGATIVE
Nitrite: NEGATIVE
Nitrite: NEGATIVE
Protein, ur: 30 mg/dL — AB
Protein, ur: >300 mg/dL — AB
Specific Gravity, Urine: 1.025 (ref 1.005–1.030)
Specific Gravity, Urine: 1.029 (ref 1.005–1.030)
Urobilinogen, UA: 0.2 mg/dL (ref 0.0–1.0)
Urobilinogen, UA: 0.2 mg/dL (ref 0.0–1.0)
pH: 5 (ref 5.0–8.0)
pH: 5 (ref 5.0–8.0)

## 2011-01-04 LAB — GLUCOSE, CAPILLARY
Glucose-Capillary: 167 mg/dL — ABNORMAL HIGH (ref 70–99)
Glucose-Capillary: 192 mg/dL — ABNORMAL HIGH (ref 70–99)
Glucose-Capillary: 209 mg/dL — ABNORMAL HIGH (ref 70–99)
Glucose-Capillary: 227 mg/dL — ABNORMAL HIGH (ref 70–99)

## 2011-01-04 LAB — URINE MICROSCOPIC-ADD ON

## 2011-01-04 LAB — POCT I-STAT, CHEM 8
BUN: 15 mg/dL (ref 6–23)
Calcium, Ion: 1.2 mmol/L (ref 1.12–1.32)
Chloride: 101 mEq/L (ref 96–112)
Creatinine, Ser: 1 mg/dL (ref 0.4–1.5)
Glucose, Bld: 168 mg/dL — ABNORMAL HIGH (ref 70–99)
HCT: 54 % — ABNORMAL HIGH (ref 39.0–52.0)
Hemoglobin: 18.4 g/dL — ABNORMAL HIGH (ref 13.0–17.0)
Potassium: 4.2 mEq/L (ref 3.5–5.1)
Sodium: 137 mEq/L (ref 135–145)
TCO2: 27 mmol/L (ref 0–100)

## 2011-01-04 LAB — CBC
HCT: 48.3 % (ref 39.0–52.0)
Hemoglobin: 16.5 g/dL (ref 13.0–17.0)
MCHC: 34.1 g/dL (ref 30.0–36.0)
MCV: 89.7 fL (ref 78.0–100.0)
Platelets: 257 10*3/uL (ref 150–400)
RBC: 5.39 MIL/uL (ref 4.22–5.81)
RDW: 13.5 % (ref 11.5–15.5)
WBC: 13.5 10*3/uL — ABNORMAL HIGH (ref 4.0–10.5)

## 2011-01-08 LAB — GLUCOSE, CAPILLARY: Glucose-Capillary: 230 mg/dL — ABNORMAL HIGH (ref 70–99)

## 2011-01-13 ENCOUNTER — Other Ambulatory Visit: Payer: Self-pay | Admitting: *Deleted

## 2011-01-13 DIAGNOSIS — I10 Essential (primary) hypertension: Secondary | ICD-10-CM

## 2011-01-13 DIAGNOSIS — E7800 Pure hypercholesterolemia, unspecified: Secondary | ICD-10-CM

## 2011-01-21 ENCOUNTER — Other Ambulatory Visit (INDEPENDENT_AMBULATORY_CARE_PROVIDER_SITE_OTHER): Payer: Medicare Other | Admitting: Family Medicine

## 2011-01-21 DIAGNOSIS — E119 Type 2 diabetes mellitus without complications: Secondary | ICD-10-CM

## 2011-01-21 DIAGNOSIS — E7800 Pure hypercholesterolemia, unspecified: Secondary | ICD-10-CM

## 2011-01-21 DIAGNOSIS — I10 Essential (primary) hypertension: Secondary | ICD-10-CM

## 2011-01-21 LAB — RENAL FUNCTION PANEL
Albumin: 4 g/dL (ref 3.5–5.2)
BUN: 15 mg/dL (ref 6–23)
CO2: 29 mEq/L (ref 19–32)
Calcium: 9.6 mg/dL (ref 8.4–10.5)
Chloride: 101 mEq/L (ref 96–112)
Creatinine, Ser: 1 mg/dL (ref 0.4–1.5)
GFR: 80.23 mL/min (ref >60.00)
Glucose, Bld: 210 mg/dL — ABNORMAL HIGH (ref 70–99)
Phosphorus: 3 mg/dL (ref 2.3–4.6)
Potassium: 4.8 mEq/L (ref 3.5–5.1)
Sodium: 139 mEq/L (ref 135–145)

## 2011-01-21 LAB — LIPID PANEL
Cholesterol: 161 mg/dL (ref 0–200)
HDL: 38.9 mg/dL — ABNORMAL LOW (ref >39.00)
Total CHOL/HDL Ratio: 4
Triglycerides: 254 mg/dL — ABNORMAL HIGH (ref 0.0–149.0)
VLDL: 50.8 mg/dL — ABNORMAL HIGH (ref 0.0–40.0)

## 2011-01-21 LAB — HEMOGLOBIN A1C: Hgb A1c MFr Bld: 10.4 % — ABNORMAL HIGH (ref 4.6–6.5)

## 2011-01-21 LAB — AST: AST: 20 U/L (ref 0–37)

## 2011-01-21 LAB — LDL CHOLESTEROL, DIRECT: Direct LDL: 88.3 mg/dL

## 2011-01-21 LAB — ALT: ALT: 19 U/L (ref 0–53)

## 2011-01-24 ENCOUNTER — Ambulatory Visit: Payer: Medicare Other | Admitting: Family Medicine

## 2011-01-24 DIAGNOSIS — Z0289 Encounter for other administrative examinations: Secondary | ICD-10-CM

## 2011-02-11 NOTE — Op Note (Signed)
NAME:  Jonathan [name], Jonathan [name]               ACCOUNT NO.:  [id number]   MEDICAL RECORD NO.:  [id number]          PATIENT TYPE:  AMB   LOCATION:  DAY                          FACILITY:  [facility]   PHYSICIAN:  Ronald L. Earlene Plater, M.D.  DATE OF BIRTH:  [birth year]   DATE OF PROCEDURE:  09/22/2007  DATE OF DISCHARGE:                               OPERATIVE REPORT   PREOPERATIVE DIAGNOSES:  Left ureterolithiasis with hydronephrosis  having previously placed left double-J stent.   POSTOPERATIVE DIAGNOSES:  Left ureterolithiasis with hydronephrosis  having previously placed left double-J stent.   OPERATIVE PROCEDURE:  Cystourethroscopy, left retrograde  ureteropyelogram, left flexible ureterorenoscopy, and replacement of  left double-J stent.   SURGEON:  Dr. Gaynelle Arabian.   ANESTHESIA:  LMA.   ESTIMATED BLOOD LOSS:  Negligible.   TUBES:  24 cm 7-French contoured double pigtail stent.   COMPLICATIONS:  None.   INDICATIONS FOR PROCEDURE:  [name] is a very nice 70 year old white  male who presented with left flank pain, nausea and vomiting and was  found to have a persistent radiolucent left upper ureteral calculus.  He  initially underwent an attempted left ureteroscopy and was found have a  very high bladder neck and a very tight ureter. Subsequently a left  double-J stent was placed and now he has returned for a definitive  procedure.   DESCRIPTION OF PROCEDURE:  The patient is placed in the supine position  after proper LMA anesthesia and was placed in the dorsal lithotomy  position and prepped and draped with Betadine in a sterile fashion.  Cystourethroscopy was performed with a 22.5 [place] Olympus panendoscope.  The stent was grasped and pulled to the meatus and a 0.038 [place]  Glidewire was placed into the left renal pelvis. A second wire was  placed with a 0.038 French Amplatz super stiff utilizing the dual lumen  inserter and flexible ureteroscopy was performed and the ureter  appeared  to be clear. There was slight inflammation but no actual stones were  seen. The scope was then passed into the left renal pelvis and both  upper, middle and lower pole calyces anterior and posterior were  carefully examined and there were no fragments seen.  Dye was injected  through the scope to create a retrograde ureteropyelogram and on no  filling defects were noted whatsoever. Again each calix was carefully  explored and there were no obvious stones seen.  The assumption is that  the stone was most likely uric acid being radiolucent and most likely  fragmented.  There were some slight the stone debris noted. The  ureteroscope was then visualized out through the entire ureter and again  no stones were noted. It was carefully inspected and the lower two-  thirds ureter was also scoped with a semi-rigid ureteroscope and again  there were no stones noted.  No perforations were noted and good  hemostasis was  noted to be present.  Under fluoroscopic guidance, a new  24-cm 7-French contoured double pigtail stent was placed with a pullout  string and taped to the penis. The bladder was drained, the  panendoscope was removed. Stent confirmation was performed utilizing  fluoroscopy. A separate dictation for retrograde ureteropyelogram  utilizing the flexible cystoureterorenoscope in the renal pelvis and  retrograde ureteropyelogram was performed, all calyces filled, there  were no filling defects and no extravasation.      Ronald L. Earlene Plater, M.D.  Electronically Signed     RLD/MEDQ  D:  09/22/2007  T:  09/22/2007  Job:  [id number]

## 2011-02-11 NOTE — Op Note (Signed)
NAME:  Jonathan [name], Jonathan [name]               ACCOUNT NO.:  [id number]   MEDICAL RECORD NO.:  [id number]          PATIENT TYPE:  AMB   LOCATION:  DAY                          FAC[facility]  [facility]   PHYSICIAN:  Ronald L. Earlene Plater, M.D.  DATE OF BIRTH:  10-25-1940   DATE OF PROCEDURE:  05/25/2009  DATE OF DISCHARGE:                               OPERATIVE REPORT   DIAGNOSES:  Left ureteral  and nephrolithiasis status post  electrohydraulic shock-wave lithotripsy and  first look ureteroscopy.   OPERATIVE PROCEDURE:  Cystourethroscopy, left retrograde  ureteropyelogram, removal and replacement left double-J stent, holmium  laser lithotripsy and basket stone extraction.   SURGEON:  Lucrezia Starch. Earlene Plater, M.D.   ANESTHESIA:  LMA.   ESTIMATED BLOOD LOSS:  Negligible.   TUBES:  A 26 cm 7-French contour double pigtail stent with a pullout  string.   COMPLICATIONS:  None.   INDICATIONS FOR PROCEDURE:  Jonathan [name] is a very nice 70 year old white  male who originally presented with left upper ureteral stone.  He  subsequently underwent left ESWL of the stone on April 22.  He passed  several fragments.  He presented again with the left flank pain.  He was  found to have a 6 x 8 mm left upper ureteral calculus.  After  understanding the risks, benefits and alternatives he elected to proceed  with ureteroscopy.  He subsequently initially underwent ureteroscopy and  due to the tightness of the ureter a left double-J stent was placed a  week ago.  He comes back for a second look procedure.  He understands  the risks, benefits and alternatives and wished to proceed accordingly.   PROCEDURE IN DETAIL:  The patient was placed in supine position.  After  proper LMA anesthesia, he was placed in the dorsal lithotomy position,  prepped and draped with Betadine in a sterile fashion.  Cystourethroscopy was performed with a 22.5-French Olympus panendoscope.  He had moderate trilobar hypertrophy and a very small capacity  bladder.  The stent was grasped and pulled to the meatus.  It was also located on  fluoroscopy.  A 0.038-French sensor wire was placed in the left renal  pelvis and old stent was removed.  Utilizing a dual-lumen inserter a  second 0.038-French sensor wire was placed in the left renal pelvis and  a ureteral access sheath was placed over a dilator to the upper ureter  and the dilator was removed from within it.  Utilizing the flexible  digital ureteroscope, this was passed over the wire into the left renal  pelvis and the wire was removed.  Inspection of the renal pelvis  revealed some small fragments that were in the lower pole caliceal  system.  A large 6 x 8 mm fragment was noted to be present.  It appeared  to have multiple spicules on it.  Utilizing a 200 micron laser fiber it  was lasered into multiple, approximately 1 mm fragments and several of  these were extracted, although it was felt that further extraction was  probably not necessary and may complicated the issue.  Reinspection of  the  entire renal pelvis and caliceal system revealed no other  significant fragments.  The ureteroscope was visually removed.  The  dilating sheath was removed and under fluoroscopic guidance a 26 cm, 7-  [place] contoured double pigtail stent was placed and noted to be in  good position within the left renal pelvis and within the bladder.  A  pullout tether was attached.  The bladder was drained.  The panendoscope  was removed.  The patient was taken to the recovery room stable.  The  few fragments that we obtained will be submitted for stone analysis.      Ronald L. Earlene Plater, M.D.  Electronically Signed     RLD/MEDQ  D:  05/25/2009  T:  05/25/2009  Job:  [id number]

## 2011-02-11 NOTE — Op Note (Signed)
NAME:  Jonathan [name], Jonathan [name]               ACCOUNT NO.:  [id number]   MEDICAL RECORD NO.:  [id number]          PATIENT TYPE:  AMB   LOCATION:  DAY                          FACILITY:  [facility]   PHYSICIAN:  Ronald L. Earlene Plater, M.D.  DATE OF BIRTH:  07/23/1941   DATE OF PROCEDURE:  09/08/2007  DATE OF DISCHARGE:                               OPERATIVE REPORT   PROCEDURE:  Retrograde pyelogram.   DESCRIPTION OF PROCEDURE:  Utilizing the 6 French open-ended catheter, a  left retrograde ureteral pyelogram was performed.  There were no  extravasations.  There was mild left hydronephrosis and two small  filling defects that appeared to be mobile within the bladder.  Could be  ureteral fragments or possibly radiolucent stone fragments or possibly  blood clots.  The patient tolerated the procedure well and was taken to  the recovery room stable.      Ronald L. Earlene Plater, M.D.  Electronically Signed     RLD/MEDQ  D:  09/08/2007  T:  09/08/2007  Job:  [id number]

## 2011-02-11 NOTE — Op Note (Signed)
NAME:  Jonathan [name], POMPEI               ACCOUNT NO.:  [id number]   MEDICAL RECORD NO.:  [id number]          PATIENT TYPE:  AMB   LOCATION:  DAY                          FACILITY:  [facility]   PHYSICIAN:  Ronald L. Earlene Plater, M.D.  DATE OF BIRTH:  03-09-1941   DATE OF PROCEDURE:  09/08/2007  DATE OF DISCHARGE:                               OPERATIVE REPORT   PREOPERATIVE DIAGNOSIS:  Left upper ureteral calculus with  hydronephrosis.   POSTOPERATIVE DIAGNOSIS:  Left upper ureteral calculus with  hydronephrosis.   PROCEDURE:  Cystourethroscopy with left retrograde ureteral pyelogram,  left flexible ureteroscopy, placement of left double J stent, and  placement of Foley catheter.   SURGEON:  Lucrezia Starch. Earlene Plater, M.D.   ANESTHESIA:  LMA.   ESTIMATED BLOOD LOSS:  Negligible.   TUBES:  24 cm 7 [place] contour double pigtail stent and 20 [place] Foley  catheter with 10 mL balloon.   COMPLICATIONS:  None.   INDICATIONS FOR PROCEDURE:  The patient is a very nice 70 year old  diabetic black male who presented with left flank pain.  He was found  subsequently to have a left 5 mm proximal ureteral calculus with  hydronephrosis.  The stone did not appear to be migrating.  It was  relatively radiolucent and difficult to see on KUB.  After understanding  the risks, benefits, and alternatives he elected to proceed with the  above procedure.  He understands that there is a chance that a stent  will have to be placed and the stone may not be manipulated today.   DESCRIPTION OF PROCEDURE:  The patient was placed in the supine position  and after appropriate LMA anesthesia, was placed in the dorsal lithotomy  position and prepped and draped with Betadine in a sterile fashion.  Cystourethroscopy was performed with a 22.5 [place] Olympus pan  endoscope.  He was noted to have moderate trilobar hypertrophy and a  very high bladder neck.  There was grade I trabeculation of the bladder  and efflux of clear urine was  noted from the right ureteral orifice.  The left ureteral orifice was in normal location and no other lesions  were noted.  Under fluoroscopic guidance a 0.038 [place] Sensorwire was  placed into the left renal pelvis.  Attempts at dilating the lower  ureter were very difficult and only approximately 3 cm could be dilated.  The ureter appeared to be quite tight and narrow.  A 6 French open ended  catheter was placed in the left renal pelvis and a retrograde ureteral  pyelogram was performed and there was no extravasation and again the  ureter appeared to be tight.  Utilizing a dual lumen inserter an Amplatz  superstiff 0.038 [place] wire was placed into the left renal pelvis and  again the flexible ureteroscope was attempted, but could only be passed  to the junction of the upper and middle third ureter below where the  stone was felt to be.  In looking at it, appeared to be quite tight  probably related to a stricture and it was felt that with the tight  entire  length of the ureter, it would not be safe to proceed further.  The lower 2/3 ureter was visualized.  There were no perforations and no  stones noted.  Again it was felt that for safety purposes, a stent  should be placed and allowed to dilate up and come back in two weeks.  Under fluoroscopic guidance a 24 cm 7 [place] contour double pigtail  stent was placed, noted to be in the left renal pelvis and the bladder.  The bladder was drained.  A panendoscope was removed.  A 20 French Foley  catheter was passed through some inflammation in the very tight  prostatic urethra, we were afraid of urinary retention, and the patient  was taken to the recovery room stable.   PLAN:  Approximately two weeks to bring him back for repeat flexible  cystourethroscopy.  We will perform a KUB in the office and see if we  can possibly see the stone.  Additionally, he had two small filling  defects within the left renal pelvis on the retrograde ureteral   pyelogram that could be stone fragments or blood clot.      Ronald L. Earlene Plater, M.D.  Electronically Signed     RLD/MEDQ  D:  09/08/2007  T:  09/08/2007  Job:  [id number]

## 2011-02-11 NOTE — Op Note (Signed)
NAME:  Jonathan [name], Jonathan [name]               ACCOUNT NO.:  [id number]   MEDICAL RECORD NO.:  [id number]          PATIENT TYPE:  AMB   LOCATION:  [facility]                         FACILITY:  [facility]   PHYSICIAN:  Ronald L. Earlene Plater, M.D.  DATE OF BIRTH:  [birth year]   DATE OF PROCEDURE:  05/18/2009  DATE OF DISCHARGE:                               OPERATIVE REPORT   DIAGNOSIS:  Left upper ureterolithiasis with hydronephrosis.   OPERATIVE PROCEDURES:  Cystourethroscopy, left retrograde ureteral  pyelogram, left ureteroscopy, semi-rigid with basket extraction of small  distal fragment, and placement of left double-J stent.   SURGEON:  Lucrezia Starch. Earlene Plater, M.D.   ANESTHESIA:  LMA.   ESTIMATED BLOOD LOSS:  Negligible.   TUBES:  26-cm, 7-French Contour double pigtail stent without a pullout  string.   COMPLICATIONS:  None.   INDICATIONS FOR PROCEDURE:  Mr. Schippers is a very nice 70 year old white  male who presented with left flank pain radiating to the groin on May 15, 2009.  He had had pain that had progressed over the last 48 hours.  CT scan of the abdomen and pelvis revealed a left moderate  hydronephrosis secondary to an 8 x 6-mm proximal ureteral impacted  stone.  After understanding risks, benefits, and alternatives he has  elected to proceed with the above procedure.   PROCEDURE IN DETAIL:  The patient was placed in supine position, after  proper LMA anesthesia was placed in the dorsal lithotomy position, and  prepped and draped with Betadine in a sterile fashion.  Cystourethroscopy was performed with a 22.5-French Olympus panendoscope.  There was moderate trilobar hypertrophy.  The bladder had grade 1  trabeculation and ureteral orifices were in normal location.  Utilizing  12 and 70-degree lenses, the bladder was carefully inspected and noted  to be without lesions.  Under fluoroscopic guidance, a 0.038 French  sensor wire was placed into the left renal pelvis past the stone and a  dual-lumen inserter was then passed past the stone.  The stone was noted  to be highly impacted, but we were able to get the dual lumen inserter  into the kidney.  A second wire was placed and the dual lumen inserter  was removed.  Attempts at dilating the ureter with a ureteral access  sheath were unsuccessful.  In the lower ureter it was quite tight and we  were uncomfortable pushing any harder.  One wire was then removed and  with the second safety wire in place, semi-rigid ureteroscopy was  performed with a short thin ureteroscope.  We were able to negotiate the  lower third ureter, but it was quite tight and we felt it would be  unsafe to go any higher.  There was a tiny fragment seen in the lower  ureter that was felt to probably be a fragment from the upper stone  during the dilatation process, and it was grasped with a nitinol basket  and removed.  It was felt at this point that it would be unsafe to  proceed further.  Therefore under fluoroscopic guidance a 26-cm, 7-  [place] Contour double  pigtail stent was placed without pullout string  and noted to be in good position within the left renal pelvis.  We still  had contrast within the bladder.  The bladder was drained.  The  panendoscope was  removed.  Fluoroscopic confirmation of the stent location was performed  and the patient was taken to the recovery room stable.  Plan is to re-  scope him in approximately 2 weeks when the ureter is dilated the system  up and again attempt flexible upper tract ureteroscopy.      Ronald L. Earlene Plater, M.D.  Electronically Signed     RLD/MEDQ  D:  05/18/2009  T:  05/18/2009  Job:  [id number]

## 2011-02-19 LAB — HM DIABETES EYE EXAM: HM Diabetic Eye Exam: NEGATIVE

## 2011-03-03 ENCOUNTER — Other Ambulatory Visit: Payer: Self-pay | Admitting: *Deleted

## 2011-03-03 MED ORDER — METFORMIN HCL 1000 MG PO TABS: 1000.0000 mg | ORAL_TABLET | ORAL | Status: DC

## 2011-03-15 ENCOUNTER — Telehealth: Payer: Self-pay

## 2011-03-15 NOTE — Telephone Encounter (Signed)
Updated DM eye exam with quick abstraction as instructed. Sent document for scanning.

## 2011-03-17 ENCOUNTER — Encounter: Payer: Self-pay | Admitting: Pulmonary Disease

## 2011-03-17 ENCOUNTER — Ambulatory Visit (INDEPENDENT_AMBULATORY_CARE_PROVIDER_SITE_OTHER): Payer: Medicare Other | Admitting: Pulmonary Disease

## 2011-03-17 DIAGNOSIS — J984 Other disorders of lung: Secondary | ICD-10-CM

## 2011-03-17 DIAGNOSIS — Z87891 Personal history of nicotine dependence: Secondary | ICD-10-CM

## 2011-03-17 DIAGNOSIS — J449 Chronic obstructive pulmonary disease, unspecified: Secondary | ICD-10-CM

## 2011-03-17 DIAGNOSIS — R911 Solitary pulmonary nodule: Secondary | ICD-10-CM

## 2011-03-17 NOTE — Progress Notes (Signed)
  Subjective:    Patient ID: Jonathan [name], male    DOB: Apr 25, 1941, 70 y.o.   MRN: [id number]  HPI 69/M, 54 PY smoker, diabetic, moderate COPD for FU  Initially seen for pre-op pulmonary clearance prior to shoulder surgery.  Surgery was called off when anesthetist noted wheezing. He underwent renal surgery in 12/10 uneventfully. Spirometry showed moderate airway obstruction with FEv1 54%.  CXR 8/10 showed hyperinflation. CT chest 12/08 showed 4mm RLL nodule that was stable since sep '07 -stable on FU 7/11.  Proventil does not work - preferred old albuterol MDIs. Advair helped, spiriva not helpful - expensive.  Given handicapped card .     03/17/2011 Down to 5 cigs/ d from 1.5 PPD  Does not want pneumovax or flu shot No wheezing or fever. Denies chest pain, orthopnea, hemoptysis, fever, n/v/d, edema, headache.     Review of Systems Pt denies any significant  nasal congestion or excess secretions, fever, chills, sweats, unintended wt loss, pleuritic or exertional cp, orthopnea pnd or leg swelling.  Pt also denies any obvious fluctuation in symptoms with weather or environmental change or other alleviating or aggravating factors.    Pt denies any increase in rescue therapy over baseline, denies waking up needing it or having early am exacerbations or coughing/wheezing/ or dyspnea       Objective:   Physical Exam Gen. Pleasant, well-nourished, in no distress ENT - no lesions, no post nasal drip Neck: No JVD, no thyromegaly, no carotid bruits Lungs: no use of accessory muscles, no dullness to percussion, clear without rales or rhonchi  Cardiovascular: Rhythm regular, heart sounds  normal, no murmurs or gallops, no peripheral edema Musculoskeletal: No deformities, no cyanosis or clubbing          Assessment & Plan:

## 2011-03-17 NOTE — Patient Instructions (Signed)
COngratulations on cutting down - I would really like to see you quit! We discussed signs of COPD flare

## 2011-03-17 NOTE — Assessment & Plan Note (Signed)
Best to focus our efforts here. COunselled - I don;t think he will be succesful unless his wife stops with him. Encouraged his wife to make a quit attempt too.

## 2011-03-17 NOTE — Assessment & Plan Note (Signed)
Gold stg 2 -  FEv1 54% 12/10 Ct advair Focus on tobacco cessation

## 2011-06-18 ENCOUNTER — Other Ambulatory Visit: Payer: Self-pay | Admitting: Pulmonary Disease

## 2011-06-19 ENCOUNTER — Telehealth: Payer: Self-pay | Admitting: Pulmonary Disease

## 2011-06-19 MED ORDER — FLUTICASONE-SALMETEROL 250-50 MCG/DOSE IN AEPB: 1.0000 | INHALATION_SPRAY | Freq: Two times a day (BID) | RESPIRATORY_TRACT | Status: DC

## 2011-06-19 NOTE — Telephone Encounter (Signed)
Called and spoke with Johnny Bridge at [facility] and gave verbal ok for refills.

## 2011-07-04 LAB — BASIC METABOLIC PANEL
BUN: 11
CO2: 24
Calcium: 9.3
Chloride: 99
Creatinine, Ser: 1.19
GFR calc Af Amer: >60
GFR calc non Af Amer: >60
Glucose, Bld: 239 — ABNORMAL HIGH
Potassium: 3.9
Sodium: 136

## 2011-07-04 LAB — HEMOGLOBIN AND HEMATOCRIT, BLOOD
HCT: 51.5
Hemoglobin: 17.9 — ABNORMAL HIGH

## 2011-07-07 LAB — BASIC METABOLIC PANEL
BUN: 10
CO2: 30
Calcium: 9.9
Chloride: 97
Creatinine, Ser: 1.15
GFR calc Af Amer: >60
GFR calc non Af Amer: >60
Glucose, Bld: 225 — ABNORMAL HIGH
Potassium: 4.8
Sodium: 139

## 2011-07-07 LAB — URINALYSIS, ROUTINE W REFLEX MICROSCOPIC
Bilirubin Urine: NEGATIVE
Glucose, UA: 100 — AB
Nitrite: NEGATIVE
Protein, ur: 30 — AB
Specific Gravity, Urine: 1.024
Urobilinogen, UA: 0.2
pH: 5

## 2011-07-07 LAB — CBC
HCT: 48.9
Hemoglobin: 17.2 — ABNORMAL HIGH
MCHC: 35.1
MCV: 86.2
Platelets: 250
RBC: 5.68
RDW: 13.3
WBC: 9.9

## 2011-07-07 LAB — APTT: aPTT: 28

## 2011-07-07 LAB — URINE MICROSCOPIC-ADD ON

## 2011-07-07 LAB — PROTIME-INR
INR: 0.9
Prothrombin Time: 12.6

## 2011-10-28 ENCOUNTER — Other Ambulatory Visit: Payer: Self-pay | Admitting: Family Medicine

## 2011-11-11 ENCOUNTER — Other Ambulatory Visit: Payer: Self-pay | Admitting: Family Medicine

## 2011-11-14 NOTE — Telephone Encounter (Signed)
[facility] [place] request ventolin inhaler. Pt last seen 10/28/10.Please advise.

## 2011-11-16 NOTE — Telephone Encounter (Signed)
Will refill electronically  

## 2011-12-01 ENCOUNTER — Inpatient Hospital Stay (HOSPITAL_COMMUNITY)
Admission: EM | Admit: 2011-12-01 | Discharge: 2011-12-04 | DRG: 066 | Disposition: A | Discharge status: Home / Self Care | Payer: Medicare Other | Attending: Internal Medicine | Admitting: Internal Medicine

## 2011-12-01 ENCOUNTER — Encounter (HOSPITAL_COMMUNITY): Payer: Self-pay

## 2011-12-01 ENCOUNTER — Other Ambulatory Visit: Payer: Self-pay

## 2011-12-01 ENCOUNTER — Emergency Department (HOSPITAL_COMMUNITY): Payer: Medicare Other

## 2011-12-01 ENCOUNTER — Inpatient Hospital Stay (HOSPITAL_COMMUNITY): Payer: Medicare Other

## 2011-12-01 DIAGNOSIS — I10 Essential (primary) hypertension: Secondary | ICD-10-CM | POA: Diagnosis present

## 2011-12-01 DIAGNOSIS — I639 Cerebral infarction, unspecified: Secondary | ICD-10-CM | POA: Diagnosis present

## 2011-12-01 DIAGNOSIS — E119 Type 2 diabetes mellitus without complications: Secondary | ICD-10-CM | POA: Diagnosis present

## 2011-12-01 DIAGNOSIS — Z720 Tobacco use: Secondary | ICD-10-CM | POA: Diagnosis present

## 2011-12-01 DIAGNOSIS — F17200 Nicotine dependence, unspecified, uncomplicated: Secondary | ICD-10-CM | POA: Diagnosis present

## 2011-12-01 DIAGNOSIS — E785 Hyperlipidemia, unspecified: Secondary | ICD-10-CM | POA: Diagnosis present

## 2011-12-01 DIAGNOSIS — D45 Polycythemia vera: Secondary | ICD-10-CM | POA: Diagnosis present

## 2011-12-01 DIAGNOSIS — J449 Chronic obstructive pulmonary disease, unspecified: Secondary | ICD-10-CM | POA: Diagnosis present

## 2011-12-01 DIAGNOSIS — I6340 Cerebral infarction due to embolism of unspecified cerebral artery: Principal | ICD-10-CM | POA: Diagnosis present

## 2011-12-01 DIAGNOSIS — Z794 Long term (current) use of insulin: Secondary | ICD-10-CM

## 2011-12-01 DIAGNOSIS — J4489 Other specified chronic obstructive pulmonary disease: Secondary | ICD-10-CM | POA: Diagnosis present

## 2011-12-01 LAB — CBC
HCT: 50.9 % (ref 39.0–52.0)
Hemoglobin: 18.3 g/dL — ABNORMAL HIGH (ref 13.0–17.0)
MCH: 30.9 pg (ref 26.0–34.0)
MCHC: 36 g/dL (ref 30.0–36.0)
MCV: 86 fL (ref 78.0–100.0)
Platelets: 182 10*3/uL (ref 150–400)
RBC: 5.92 MIL/uL — ABNORMAL HIGH (ref 4.22–5.81)
RDW: 13 % (ref 11.5–15.5)
WBC: 9.1 10*3/uL (ref 4.0–10.5)

## 2011-12-01 LAB — DIFFERENTIAL
Basophils Absolute: 0 10*3/uL (ref 0.0–0.1)
Basophils Relative: 0 % (ref 0–1)
Eosinophils Absolute: 0.2 10*3/uL (ref 0.0–0.7)
Eosinophils Relative: 2 % (ref 0–5)
Lymphocytes Relative: 23 % (ref 12–46)
Lymphs Abs: 2.1 10*3/uL (ref 0.7–4.0)
Monocytes Absolute: 0.7 10*3/uL (ref 0.1–1.0)
Monocytes Relative: 8 % (ref 3–12)
Neutro Abs: 6 10*3/uL (ref 1.7–7.7)
Neutrophils Relative %: 66 % (ref 43–77)

## 2011-12-01 LAB — POCT I-STAT, CHEM 8
BUN: 16 mg/dL (ref 6–23)
Calcium, Ion: 1.14 mmol/L (ref 1.12–1.32)
Chloride: 103 mEq/L (ref 96–112)
Creatinine, Ser: 0.8 mg/dL (ref 0.50–1.35)
Glucose, Bld: 269 mg/dL — ABNORMAL HIGH (ref 70–99)
HCT: 55 % — ABNORMAL HIGH (ref 39.0–52.0)
Hemoglobin: 18.7 g/dL — ABNORMAL HIGH (ref 13.0–17.0)
Potassium: 4.3 mEq/L (ref 3.5–5.1)
Sodium: 137 mEq/L (ref 135–145)
TCO2: 27 mmol/L (ref 0–100)

## 2011-12-01 LAB — GLUCOSE, CAPILLARY: Glucose-Capillary: 350 mg/dL — ABNORMAL HIGH (ref 70–99)

## 2011-12-01 MED ORDER — GLIPIZIDE ER 10 MG PO TB24
10.0000 mg | ORAL_TABLET | Freq: Every day | ORAL | Status: DC
  Administered 2011-12-02 – 2011-12-04 (×3): 10 mg via ORAL
  Filled 2011-12-01 (×4): qty 1

## 2011-12-01 MED ORDER — ASPIRIN 325 MG PO TABS
325.0000 mg | ORAL_TABLET | Freq: Every day | ORAL | Status: DC
  Administered 2011-12-01 – 2011-12-04 (×4): 325 mg via ORAL
  Filled 2011-12-01 (×4): qty 1

## 2011-12-01 MED ORDER — FLUTICASONE-SALMETEROL 250-50 MCG/DOSE IN AEPB
1.0000 | INHALATION_SPRAY | Freq: Two times a day (BID) | RESPIRATORY_TRACT | Status: DC
  Administered 2011-12-02 – 2011-12-04 (×5): 1 via RESPIRATORY_TRACT
  Filled 2011-12-01: qty 14

## 2011-12-01 MED ORDER — ATORVASTATIN CALCIUM 10 MG PO TABS
10.0000 mg | ORAL_TABLET | Freq: Every day | ORAL | Status: DC
  Administered 2011-12-01 – 2011-12-04 (×4): 10 mg via ORAL
  Filled 2011-12-01 (×4): qty 1

## 2011-12-01 MED ORDER — ONDANSETRON HCL 4 MG/2ML IJ SOLN: 4.0000 mg | Freq: Four times a day (QID) | INTRAMUSCULAR | Status: DC | PRN

## 2011-12-01 MED ORDER — ASPIRIN 300 MG RE SUPP
300.0000 mg | Freq: Every day | RECTAL | Status: DC
  Filled 2011-12-01 (×4): qty 1

## 2011-12-01 MED ORDER — ACETAMINOPHEN 325 MG PO TABS
650.0000 mg | ORAL_TABLET | ORAL | Status: DC | PRN
  Administered 2011-12-02 (×3): 650 mg via ORAL
  Filled 2011-12-01 (×3): qty 2

## 2011-12-01 MED ORDER — ACETAMINOPHEN 650 MG RE SUPP: 650.0000 mg | RECTAL | Status: DC | PRN

## 2011-12-01 MED ORDER — ENOXAPARIN SODIUM 40 MG/0.4ML ~~LOC~~ SOLN
40.0000 mg | SUBCUTANEOUS | Status: DC
  Administered 2011-12-01 – 2011-12-03 (×3): 40 mg via SUBCUTANEOUS
  Filled 2011-12-01 (×4): qty 0.4

## 2011-12-01 MED ORDER — INSULIN ASPART 100 UNIT/ML ~~LOC~~ SOLN
0.0000 [IU] | Freq: Three times a day (TID) | SUBCUTANEOUS | Status: DC
  Administered 2011-12-02: 5 [IU] via SUBCUTANEOUS
  Administered 2011-12-02: 7 [IU] via SUBCUTANEOUS
  Administered 2011-12-02: 3 [IU] via SUBCUTANEOUS
  Administered 2011-12-03: 7 [IU] via SUBCUTANEOUS
  Administered 2011-12-03 (×2): 3 [IU] via SUBCUTANEOUS
  Administered 2011-12-04: 5 [IU] via SUBCUTANEOUS
  Filled 2011-12-01: qty 3

## 2011-12-01 MED ORDER — HYDROCODONE-ACETAMINOPHEN 5-325 MG PO TABS
2.0000 | ORAL_TABLET | Freq: Once | ORAL | Status: AC
  Administered 2011-12-01: 2 via ORAL
  Filled 2011-12-01: qty 2

## 2011-12-01 MED ORDER — SENNOSIDES-DOCUSATE SODIUM 8.6-50 MG PO TABS: 1.0000 | ORAL_TABLET | Freq: Every evening | ORAL | Status: DC | PRN

## 2011-12-01 MED ORDER — SODIUM CHLORIDE 0.9 % IV SOLN
INTRAVENOUS | Status: DC
  Administered 2011-12-01 – 2011-12-02 (×2): via INTRAVENOUS

## 2011-12-01 MED ORDER — ALBUTEROL SULFATE (5 MG/ML) 0.5% IN NEBU: 2.5000 mg | INHALATION_SOLUTION | RESPIRATORY_TRACT | Status: DC | PRN

## 2011-12-01 MED ORDER — FLUTICASONE PROPIONATE 50 MCG/ACT NA SUSP
1.0000 | Freq: Every day | NASAL | Status: DC
  Administered 2011-12-01 – 2011-12-04 (×4): 1 via NASAL
  Filled 2011-12-01: qty 16

## 2011-12-01 MED ORDER — INSULIN ASPART 100 UNIT/ML ~~LOC~~ SOLN
5.0000 [IU] | Freq: Once | SUBCUTANEOUS | Status: AC
  Administered 2011-12-01: 5 [IU] via SUBCUTANEOUS

## 2011-12-01 NOTE — ED Notes (Signed)
Patient presents from PCP for further evaluation of headache, blurred vision and weakness since Wednesday last week. Patient seen by optometrist today and had eyes dilated. Grips equal bilaterally, no facial droop, arm drift or slurred speech present.

## 2011-12-01 NOTE — H&P (Signed)
PATIENT DETAILS Name: Jonathan [name] Age: 71 y.o. Sex: male Date of Birth: 01/14/1941 Admit Date: 12/01/2011 ZOX:[id number] Tower, MD, MD   CHIEF COMPLAINT:  Headache and bilateral visual disturbances since Friday  HPI: Patient is a 71 year old white male, long standing history of tobacco abuse, diabetes, hypertension, dyslipidemia history of CVA presents to the ED with the above-noted complaints. Apparently on Friday the patient started having floaters in both his eyes but predominantly on the left. He then started developing headaches. He started noticing that he could not see things on the periphery of both visual fields. He then went to his ophthalmologist, who noticed he had a bilateral visual field defects and sent it to the ED for further evaluation. 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 The hospitalist service has not been asked to this patient for evaluation and treatment. Patient denies any other complaints such as dysarthria, or weakness of any part of his body. He denies any headache chest pain, shortness of breath. He denies any nausea vomiting or diarrhea. He denies any abdominal pain.   ALLERGIES:   Allergies  Allergen Reactions  . Augmentin Other (See Comments)    Severe stomach pain.   . Quinapril Hcl     REACTION: back pain  . Valsartan     REACTION: back pain  . Varenicline Tartrate     REACTION: AMS, hallucinations, night mares    PAST MEDICAL HISTORY: Past Medical History  Diagnosis Date  . HTN (hypertension)   . HLD (hyperlipidemia)   . Pulmonary nodule, right     lower lobe  . COPD (chronic obstructive pulmonary disease)   . Rotator cuff injury     right  . Frozen shoulder   . Shoulder pain   . Hyperkalemia   . Dermatitis seborrheica   . Other diseases of lung, not elsewhere classified   . Kidney stone   . Allergic rhinitis   . ED (erectile dysfunction)   . Diabetes mellitus type II    . Asthma   . Tobacco abuse     PAST SURGICAL HISTORY: Past Surgical History  Procedure Date  . Cervical discectomy 1998  . Ett 11/1994  . Tm repair     MEDICATIONS AT HOME: Prior to Admission medications   Medication Sig Start Date End Date Taking? Authorizing Provider  albuterol (PROVENTIL) (2.5 MG/3ML) 0.083% nebulizer solution Take 2.5 mg by nebulization 2 (two) times daily as needed.     Yes Historical Provider, MD  amLODipine (NORVASC) 5 MG tablet Take 5 mg by mouth daily.     Yes Historical Provider, MD  atorvastatin (LIPITOR) 10 MG tablet Take 10 mg by mouth daily.     Yes Historical Provider, MD  Fluticasone-Salmeterol (ADVAIR DISKUS) 250-50 MCG/DOSE AEPB Inhale 1 puff into the lungs every 12 (twelve) hours. 06/19/11  Yes Rakesh V. Vassie Loll, MD  GLIPIZIDE XL 10 MG 24 hr tablet TAKE 1 TABLET ONCE DAILY 10/28/11  Yes Roxy Manns, MD  ibuprofen (ADVIL,MOTRIN) 200 MG tablet Take 400 mg by mouth every 6 (six) hours as needed. As needed for pain.   Yes Historical Provider, MD  insulin glargine (LANTUS SOLOSTAR) 100 UNIT/ML injection Inject 10 Units into the skin every evening. 1800   Yes Historical Provider, MD  metFORMIN (GLUCOPHAGE) 1000 MG tablet Take 1 tablet (1,000 mg total) by mouth as directed. 1 at breakfast; 1/2 at lunch and 1 in evening 03/03/11  Yes [employer],  MD  mometasone (NASONEX) 50 MCG/ACT nasal spray 2 sprays by Nasal route daily.     Yes Historical Provider, MD  vardenafil (LEVITRA) 10 MG tablet Take 10 mg by mouth daily as needed.     Yes Historical Provider, MD  VENTOLIN HFA 108 (90 BASE) MCG/ACT inhaler USE 2 PUFFS EVERY 4 HOURS AS NEEDED FOR WHEEZE 11/11/11  Yes Roxy Manns, MD  glucose blood test strip 1 each by Other route 2 (two) times daily. Use as instructed     Historical Provider, MD  Insulin Pen Needle (PEN NEEDLES 5/16") 30G X 8 MM MISC by Does not apply route as directed.      Historical Provider, MD  Lancets 28G MISC by Does not apply route 2 (two) times  daily.      Historical Provider, MD    FAMILY HISTORY: Family History  Problem Relation Age of Onset  . Asthma Sister   . Emphysema Sister     SOCIAL HISTORY:  reports that he has been smoking Cigarettes.  He has been smoking about .5 packs per day. He has never used smokeless tobacco. He reports that he does not drink alcohol. His drug history not on file.  REVIEW OF SYSTEMS:  Constitutional:   No  weight loss, night sweats,  Fevers, chills, fatigue.  HEENT:    No headaches, Difficulty swallowing,Tooth/dental problems,Sore throat,  No sneezing, itching, ear ache, nasal congestion, post nasal drip,   Cardio-vascular: No chest pain,  Orthopnea, PND, swelling in lower extremities, anasarca,dizziness, palpitations  GI:  No heartburn, indigestion, abdominal pain, nausea, vomiting, diarrhea, change in bowel habits, loss of appetite  Resp: No shortness of breath with exertion or at rest.  No excess mucus, no productive cough, No non-productive cough,  No coughing up of blood.No change in color of mucus.No wheezing.No chest wall deformity  Skin:  no rash or lesions.  GU:  no dysuria, change in color of urine, no urgency or frequency.  No flank pain.  Musculoskeletal: No joint pain or swelling.  No decreased range of motion.  No back pain.  Psych: No change in mood or affect. No depression or anxiety.  No memory loss.   PHYSICAL EXAM: Blood pressure 132/75, pulse 100, temperature 97.5 F (36.4 C), temperature source Oral, resp. rate 26, SpO2 94.00%.  General appearance :Awake, alert, not in any distress. Speech Clear. Not toxic Looking HEENT: Atraumatic and Normocephalic, pupils equally reactive to light and accomodation Neck: supple, no JVD. No cervical lymphadenopathy.  Chest:Good air entry bilaterally, no added sounds  CVS: S1 S2 regular, no murmurs.  Abdomen: Bowel sounds present, Non tender and not distended with no gaurding, rigidity or rebound. Extremities: B/L  Lower Ext shows no edema, both legs are warm to touch, with  dorsalis pedis pulses palpable. Neurology: Awake alert, and oriented X 3, CN II-XII intact, Non focal Skin:No Rash Wounds:N/A  LABS ON ADMISSION:   Basename 12/01/11 1602  NA 137  K 4.3  CL 103  CO2 --  GLUCOSE 269*  BUN 16  CREATININE 0.80  CALCIUM --  MG --  PHOS --   No results found for this basename: AST:2,ALT:2,ALKPHOS:2,BILITOT:2,PROT:2,ALBUMIN:2 in the last 72 hours No results found for this basename: LIPASE:2,AMYLASE:2 in the last 72 hours  Basename 12/01/11 1602 12/01/11 1458  WBC -- 9.1  NEUTROABS -- 6.0  HGB 18.7* 18.3*  HCT 55.0* 50.9  MCV -- 86.0  PLT -- 182   No results found for this basename: CKTOTAL:3,CKMB:3,CKMBINDEX:3,TROPONINI:3 in the  last 72 hours No results found for this basename: DDIMER:2 in the last 72 hours No components found with this basename: POCBNP:3   RADIOLOGIC STUDIES ON ADMISSION: Mr Shirlee Latch Wo Contrast  12/01/2011  *RADIOLOGY REPORT*  Clinical Data:  71 year old male with headache, blurred vision.  Comparison: None.  MRI HEAD WITHOUT CONTRAST  Technique: Multiplanar, multiecho pulse sequences of the brain and surrounding structures were obtained according to standard protocol without intravenous contrast.  Findings: Scattered small cortical and subcortical white matter infarcts with restricted diffusion in the right hemisphere are wide spread affecting all of the posterior right temporal, occipital, parietal, and superior right frontal lobes.  The individual lesions are small and without hemorrhage or significant mass effect. Subtle associated T2 and FLAIR hyperintensity is associated. No posterior fossa or left hemisphere diffusion abnormality.  Major intracranial vascular flow voids are preserved.  Superimposed on a acute findings are widespread foci of cerebral white matter T2 and FLAIR hyperintensity.  Partially empty sella.  No ventriculomegaly. No acute intracranial hemorrhage  identified.  No extra-axial collection.  Negative cervicomedullary junction and visualized cervical spine.  Normal bone marrow signal.  Paranasal sinus and right mastoid inflammatory changes.  Negative visualized nasopharynx. Visualized orbit soft tissues are within normal limits.  Negative scalp soft tissues.  IMPRESSION: 1.  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 2.  No associated mass effect or hemorrhage.  Chronic underlying nonspecific cerebral white matter signal changes.  MRA HEAD WITHOUT CONTRAST  Technique: Angiographic images of the Circle of Willis were obtained using MRA technique without  intravenous contrast.  Findings: Dominant distal right vertebral artery primarily supplies the basilar.  Poor visualization of the diminutive distal left vertebral artery.  Normal right PICA.  Dominant left AICA.  No basilar stenosis.  Superior cerebellar arteries are within normal limits.  Both PCA have fetal origins, that on the right explaining the infarct pattern on the basis of right carotid disease.  No proximal PCA stenosis.  Visualized bilateral PCA branches are within normal limits.  Antegrade flow in both ICA siphons which are irregular.  No significant left ICA stenosis.  There is a possible filling defect in the proximal cavernous right ICA (series 6 image 75). I favor this is artifact.  The supraclinoid right ICA is patent.  No other hemodynamically significant ICA stenosis occurs. Both ophthalmic and posterior communicating artery origins are within normal limits.  The carotid termini are patent.  The left ACA A1 segment is dominant.  Normal anterior communicating artery.  Median artery of the corpus callosum is present.  Visualized ACA branches are within normal limits.  Left MCA M1 segment is irregular but without  significant stenosis.  Visualized left MCA branches are within normal limits.  The right MCA M1 segment is within normal limits.  The right MCA bifurcation is patent.  No major right MCA branch occlusion or high- grade stenosis is identified.  MRA source images do suggest decreased flow in small posterior sylvian division branches (series 6 image 118).  IMPRESSION: 1.  Fetal PCAs, congruent with the MRI findings suggestive of right carotid disease. 2.  Bilateral ICA atherosclerosis.  Possible filling defect or severe plaque in the right cavernous ICA segment which if genuine causes severe focal stenosis. 3.  Otherwise no proximal anterior circulation stenosis or major branch occlusion.  Decreased flow  signal suspected in the right MCA M3 posterior divisions.  Original Report Authenticated By: Harley Hallmark, M.D.   Mr Brain Wo Contrast  12/01/2011  *RADIOLOGY REPORT*  Clinical Data:  71 year old male with headache, blurred vision.  Comparison: None.  MRI HEAD WITHOUT CONTRAST  Technique: Multiplanar, multiecho pulse sequences of the brain and surrounding structures were obtained according to standard protocol without intravenous contrast.  Findings: Scattered small cortical and subcortical white matter infarcts with restricted diffusion in the right hemisphere are wide spread affecting all of the posterior right temporal, occipital, parietal, and superior right frontal lobes.  The individual lesions are small and without hemorrhage or significant mass effect. Subtle associated T2 and FLAIR hyperintensity is associated. No posterior fossa or left hemisphere diffusion abnormality.  Major intracranial vascular flow voids are preserved.  Superimposed on a acute findings are widespread foci of cerebral white matter T2 and FLAIR hyperintensity.  Partially empty sella.  No ventriculomegaly. No acute intracranial hemorrhage identified.  No extra-axial collection.  Negative cervicomedullary junction and visualized cervical  spine.  Normal bone marrow signal.  Paranasal sinus and right mastoid inflammatory changes.  Negative visualized nasopharynx. Visualized orbit soft tissues are within normal limits.  Negative scalp soft tissues.  IMPRESSION: 1.  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 2.  No associated mass effect or hemorrhage.  Chronic underlying nonspecific cerebral white matter signal changes.  MRA HEAD WITHOUT CONTRAST  Technique: Angiographic images of the Circle of Willis were obtained using MRA technique without  intravenous contrast.  Findings: Dominant distal right vertebral artery primarily supplies the basilar.  Poor visualization of the diminutive distal left vertebral artery.  Normal right PICA.  Dominant left AICA.  No basilar stenosis.  Superior cerebellar arteries are within normal limits.  Both PCA have fetal origins, that on the right explaining the infarct pattern on the basis of right carotid disease.  No proximal PCA stenosis.  Visualized bilateral PCA branches are within normal limits.  Antegrade flow in both ICA siphons which are irregular.  No significant left ICA stenosis.  There is a possible filling defect in the proximal cavernous right ICA (series 6 image 75). I favor this is artifact.  The supraclinoid right ICA is patent.  No other hemodynamically significant ICA stenosis occurs. Both ophthalmic and posterior communicating artery origins are within normal limits.  The carotid termini are patent.  The left ACA A1 segment is dominant.  Normal anterior communicating artery.  Median artery of the corpus callosum is present.  Visualized ACA branches are within normal limits.  Left MCA M1 segment is irregular but without significant stenosis.  Visualized left MCA branches are within normal limits.  The right MCA M1 segment  is within normal limits.  The right MCA bifurcation is patent.  No major right MCA branch occlusion or high- grade stenosis is identified.  MRA source images do suggest decreased flow in small posterior sylvian division branches (series 6 image 118).  IMPRESSION: 1.  Fetal PCAs, congruent with the MRI findings suggestive of right carotid disease. 2.  Bilateral ICA atherosclerosis.  Possible filling defect or severe plaque in the right cavernous ICA segment which if genuine causes severe focal stenosis. 3.  Otherwise no proximal anterior circulation stenosis or major branch occlusion.  Decreased flow signal suspected in the right MCA M3 posterior divisions.  Original Report Authenticated By: Harley Hallmark, M.D.    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  -Start an aspirin  -Get a 2-D echocardiogram and carotid Doppler  -Have already consulted neurology, await their recommendations  -Continue with statin   .HYPERTENSION -BP currently controlled -Allow for some permissive hypertension, slowly restart his usual antihypertensive  .Tobacco abuse -Have counseled extensively   .HYPERLIPIDEMIA -Continue with statin  -Check a lipid panel   .DIABETES MELLITUS, TYPE II -Hold metformin  -Continue with glipizide  -Place on SSI   .COPD -Stable -Continue with Advair -Albuterol  nebs as needed  Further plan will depend as patient's clinical course evolves and further radiologic and laboratory data become available. Patient will be monitored closely.  DVT Prophylaxis: Prophylactic Lovenox  Code Status: Full code  Total time spent for admission equals 45 minutes.  Jeoffrey Massed 12/01/2011, 6:52 PM

## 2011-12-01 NOTE — ED Notes (Signed)
Per Dr. Shela Commons (EDP) patient may have food.

## 2011-12-01 NOTE — ED Notes (Addendum)
First attempt to call report. Morrie Sheldon, RN (Press photographer) states the nurse is report and will call back on 5 minutes.

## 2011-12-01 NOTE — ED Notes (Signed)
Pt undressed and in gown. Cardiac monitor, pulse oximetry, and blood pressure cuff on. 

## 2011-12-01 NOTE — ED Notes (Signed)
First meeting patient. Patient remains on monitor with NAD at this time.

## 2011-12-01 NOTE — Consult Note (Signed)
Referring Physician: Dr.Ghimire     Chief Complaint: visual changes  HPI: Jonathan [name] is an 71 y.o. male white presenting in the ER with visual changes. The patient tells me that Friday which was about 3 days ago he began to have "floaters' in his eyes. These floaters were present in both eyes and lasted about 30-40 minutes at a time. The patient had these floaters on and off until now and continues to have them. Around Friday night/early Saturday morning the patient developed a headache which he describes as achy and pressure-like. The headache was located on the right side of his head and behind his right eye. Coughing made the headache worse. Using his hand to push against the side of his head and against his right eye relieves some of the pain. The patient did not have any nausea or vomiting with the headache. He did not have any dizziness/vertigo/lightheadedness.patient did not have any changes in speech nor did he have any numbness or tingling anywhere. The patient also did not have any weakness. He did have some light sensitivity but he says that that is long-standing. The patient did not have any changes in his headache with smell, movement or noise. Around the patient noticed some clumsiness with his left hand. He was unable to reach for things that he wanted to grab. The patient is not on a daily blood thinner. His never had this happen before.   LSN: 11/28/2011 tPA Given: No: Outside time window for TPA treatment  mRankin:  Past Medical History  Diagnosis Date  . HTN (hypertension)   . HLD (hyperlipidemia)   . Pulmonary nodule, right     lower lobe  . COPD (chronic obstructive pulmonary disease)   . Rotator cuff injury     right  . Frozen shoulder   . Shoulder pain   . Hyperkalemia   . Dermatitis seborrheica   . Other diseases of lung, not elsewhere classified   . Kidney stone   . Allergic rhinitis   . ED (erectile dysfunction)   . Diabetes mellitus type II   . Asthma   .  Tobacco abuse     Past Surgical History  Procedure Date  . Cervical discectomy 1998  . Ett 11/1994  . Tm repair     Family History  Problem Relation Age of Onset  . Asthma Sister   . Emphysema Sister    Social History:  reports that he has been smoking Cigarettes.  He has been smoking about .5 packs per day. He has never used smokeless tobacco. He reports that he does not drink alcohol. His drug history not on file.  Allergies:  Allergies  Allergen Reactions  . Augmentin Other (See Comments)    Severe stomach pain.   . Quinapril Hcl     REACTION: back pain  . Valsartan     REACTION: back pain  . Varenicline Tartrate     REACTION: AMS, hallucinations, night mares    Medications: I have reviewed the patient's current medications.  ROS:  the patient denies chest pain, shortness of breath, abdominal pain, fever, chills. He denies palpitations, diaphoresis. He also denies diarrhea or pain with urination. The patient denies cough. He does not have any muscle aches nor does he have any blood in his bowel movements or urine.  Physical Examination: Blood pressure 114/100, pulse 74, temperature 97.5 F (36.4 C), temperature source Oral, resp. rate 18, SpO2 91.00%.  general: The patient is alert and oriented x3 Cardiovascular:  Heart tones S1 and S2 are heard, no S3/S4/carotid bruits/murmurs Lungs: Wheezing all front fields Abdomen: Soft Neurologic Examination:  naming/repetition/comprehension intact and speech is fluent. Funduscopic exam shows sharp optic disc edges and positive for venous pulsations bilaterally. Pupils are equal round and reactive to light. Abnormal saccades to the left both eyes left more than right. No visual field deficits. V1 to V3 intact to soft touch and pinprick. No facial droop. Hearing grossly intact. Pharynx arch symmetric in stand and elevation. SCM/trapezius 5/5. Tongue midline. Motor: 5/5, slight drift, normal tone Sensory: Intact to soft touch and  pinprick Reflexes: Symmetric, no Babinski's Coordination: Finger-nose-finger and heel-knee-shin slight ataxia on the left, right finger-nose-finger heel-knee-shin within normal limits  Results for orders placed during the hospital encounter of 12/01/11 (from the past 48 hour(s))  CBC     Status: Abnormal   Collection Time   12/01/11  2:58 PM      Component Value Range Comment   WBC 9.1  4.0 - 10.5 (K/uL)    RBC 5.92 (*) 4.22 - 5.81 (MIL/uL)    Hemoglobin 18.3 (*) 13.0 - 17.0 (g/dL)    HCT 16.1  09.6 - 04.5 (%)    MCV 86.0  78.0 - 100.0 (fL)    MCH 30.9  26.0 - 34.0 (pg)    MCHC 36.0  30.0 - 36.0 (g/dL)    RDW 40.9  81.1 - 91.4 (%)    Platelets 182  150 - 400 (K/uL)   DIFFERENTIAL     Status: Normal   Collection Time   12/01/11  2:58 PM      Component Value Range Comment   Neutrophils Relative 66  43 - 77 (%)    Neutro Abs 6.0  1.7 - 7.7 (K/uL)    Lymphocytes Relative 23  12 - 46 (%)    Lymphs Abs 2.1  0.7 - 4.0 (K/uL)    Monocytes Relative 8  3 - 12 (%)    Monocytes Absolute 0.7  0.1 - 1.0 (K/uL)    Eosinophils Relative 2  0 - 5 (%)    Eosinophils Absolute 0.2  0.0 - 0.7 (K/uL)    Basophils Relative 0  0 - 1 (%)    Basophils Absolute 0.0  0.0 - 0.1 (K/uL)   POCT I-STAT, CHEM 8     Status: Abnormal   Collection Time   12/01/11  4:02 PM      Component Value Range Comment   Sodium 137  135 - 145 (mEq/L)    Potassium 4.3  3.5 - 5.1 (mEq/L)    Chloride 103  96 - 112 (mEq/L)    BUN 16  6 - 23 (mg/dL)    Creatinine, Ser 7.82  0.50 - 1.35 (mg/dL)    Glucose, Bld 956 (*) 70 - 99 (mg/dL)    Calcium, Ion 2.13  1.12 - 1.32 (mmol/L)    TCO2 27  0 - 100 (mmol/L)    Hemoglobin 18.7 (*) 13.0 - 17.0 (g/dL)    HCT 08.6 (*) 57.8 - 52.0 (%)    Mr Shirlee Latch Wo Contrast  12/01/2011  *RADIOLOGY REPORT*  Clinical Data:  71 year old male with headache, blurred vision.  Comparison: None.  MRI HEAD WITHOUT CONTRAST  Technique: Multiplanar, multiecho pulse sequences of the brain and surrounding  structures were obtained according to standard protocol without intravenous contrast.  Findings: Scattered small cortical and subcortical white matter infarcts with restricted diffusion in the right hemisphere are wide spread affecting all of the  posterior right temporal, occipital, parietal, and superior right frontal lobes.  The individual lesions are small and without hemorrhage or significant mass effect. Subtle associated T2 and FLAIR hyperintensity is associated. No posterior fossa or left hemisphere diffusion abnormality.  Major intracranial vascular flow voids are preserved.  Superimposed on a acute findings are widespread foci of cerebral white matter T2 and FLAIR hyperintensity.  Partially empty sella.  No ventriculomegaly. No acute intracranial hemorrhage identified.  No extra-axial collection.  Negative cervicomedullary junction and visualized cervical spine.  Normal bone marrow signal.  Paranasal sinus and right mastoid inflammatory changes.  Negative visualized nasopharynx. Visualized orbit soft tissues are within normal limits.  Negative scalp soft tissues.  IMPRESSION: 1.  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 2.  No associated mass effect or hemorrhage.  Chronic underlying nonspecific cerebral white matter signal changes.  MRA HEAD WITHOUT CONTRAST  Technique: Angiographic images of the Circle of Willis were obtained using MRA technique without  intravenous contrast.  Findings: Dominant distal right vertebral artery primarily supplies the basilar.  Poor visualization of the diminutive distal left vertebral artery.  Normal right PICA.  Dominant left AICA.  No basilar stenosis.  Superior cerebellar arteries are within normal limits.  Both PCA have fetal origins, that on the right explaining the infarct pattern on  the basis of right carotid disease.  No proximal PCA stenosis.  Visualized bilateral PCA branches are within normal limits.  Antegrade flow in both ICA siphons which are irregular.  No significant left ICA stenosis.  There is a possible filling defect in the proximal cavernous right ICA (series 6 image 75). I favor this is artifact.  The supraclinoid right ICA is patent.  No other hemodynamically significant ICA stenosis occurs. Both ophthalmic and posterior communicating artery origins are within normal limits.  The carotid termini are patent.  The left ACA A1 segment is dominant.  Normal anterior communicating artery.  Median artery of the corpus callosum is present.  Visualized ACA branches are within normal limits.  Left MCA M1 segment is irregular but without significant stenosis.  Visualized left MCA branches are within normal limits.  The right MCA M1 segment is within normal limits.  The right MCA bifurcation is patent.  No major right MCA branch occlusion or high- grade stenosis is identified.  MRA source images do suggest decreased flow in small posterior sylvian division branches (series 6 image 118).  IMPRESSION: 1.  Fetal PCAs, congruent with the MRI findings suggestive of right carotid disease. 2.  Bilateral ICA atherosclerosis.  Possible filling defect or severe plaque in the right cavernous ICA segment which if genuine causes severe focal stenosis. 3.  Otherwise no proximal anterior circulation stenosis or major branch occlusion.  Decreased flow signal suspected in the right MCA M3 posterior divisions.  Original Report Authenticated By: Harley Hallmark, M.D.   Mr Brain Wo Contrast  12/01/2011  *RADIOLOGY REPORT*  Clinical Data:  71 year old male with headache, blurred vision.  Comparison: None.  MRI HEAD WITHOUT CONTRAST  Technique: Multiplanar, multiecho pulse sequences of the brain and surrounding structures were obtained according to standard protocol without intravenous contrast.  Findings:  Scattered small cortical and subcortical white matter infarcts with restricted diffusion in the right hemisphere are wide spread affecting all of the posterior right temporal, occipital, parietal, and superior right frontal  lobes.  The individual lesions are small and without hemorrhage or significant mass effect. Subtle associated T2 and FLAIR hyperintensity is associated. No posterior fossa or left hemisphere diffusion abnormality.  Major intracranial vascular flow voids are preserved.  Superimposed on a acute findings are widespread foci of cerebral white matter T2 and FLAIR hyperintensity.  Partially empty sella.  No ventriculomegaly. No acute intracranial hemorrhage identified.  No extra-axial collection.  Negative cervicomedullary junction and visualized cervical spine.  Normal bone marrow signal.  Paranasal sinus and right mastoid inflammatory changes.  Negative visualized nasopharynx. Visualized orbit soft tissues are within normal limits.  Negative scalp soft tissues.  IMPRESSION: 1.  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 2.  No associated mass effect or hemorrhage.  Chronic underlying nonspecific cerebral white matter signal changes.  MRA HEAD WITHOUT CONTRAST  Technique: Angiographic images of the Circle of Willis were obtained using MRA technique without  intravenous contrast.  Findings: Dominant distal right vertebral artery primarily supplies the basilar.  Poor visualization of the diminutive distal left vertebral artery.  Normal right PICA.  Dominant left AICA.  No basilar stenosis.  Superior cerebellar arteries are within normal limits.  Both PCA have fetal origins, that on the right explaining the infarct pattern on the basis of right carotid disease.  No proximal PCA stenosis.  Visualized bilateral PCA branches  are within normal limits.  Antegrade flow in both ICA siphons which are irregular.  No significant left ICA stenosis.  There is a possible filling defect in the proximal cavernous right ICA (series 6 image 75). I favor this is artifact.  The supraclinoid right ICA is patent.  No other hemodynamically significant ICA stenosis occurs. Both ophthalmic and posterior communicating artery origins are within normal limits.  The carotid termini are patent.  The left ACA A1 segment is dominant.  Normal anterior communicating artery.  Median artery of the corpus callosum is present.  Visualized ACA branches are within normal limits.  Left MCA M1 segment is irregular but without significant stenosis.  Visualized left MCA branches are within normal limits.  The right MCA M1 segment is within normal limits.  The right MCA bifurcation is patent.  No major right MCA branch occlusion or high- grade stenosis is identified.  MRA source images do suggest decreased flow in small posterior sylvian division branches (series 6 image 118).  IMPRESSION: 1.  Fetal PCAs, congruent with the MRI findings suggestive of right carotid disease. 2.  Bilateral ICA atherosclerosis.  Possible filling defect or severe plaque in the right cavernous ICA segment which if genuine causes severe focal stenosis. 3.  Otherwise no proximal anterior circulation stenosis or major branch occlusion.  Decreased flow signal suspected in the right MCA M3 posterior divisions.  Original Report Authenticated By: Harley Hallmark, M.D.    Assessment: 71 y.o. male  white past medical history hypertension, diabetes, hyperlipidemia and tobacco dependent presenting in the ER with visual changes and headache. Patient is not on a daily blood thinner. Physical exam is localizing. 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  Stroke Risk Factors - hyperlipidemia, hypertension and smoking  Plan: 1. HgbA1c, fasting lipid panel 2. PT consult, OT consult,  Speech consult 4. Echocardiogram, CTA head/neck 5. Carotid dopplers 6. Prophylactic therapy-Antiplatelet med: Aspirin - dose 325mg  7. Risk factor modification 8.  Telemetry monitoring   Camarion Weier Christophe Louis, MD [facility]  12/01/2011, 8:16 PM

## 2011-12-01 NOTE — ED Provider Notes (Signed)
History     CSN: [id number]  Arrival date & time 12/01/11  1318   First MD Initiated Contact with Patient 12/01/11 1432      Chief Complaint  Patient presents with  . Headache  . Blurred Vision    (Consider location/radiation/quality/duration/timing/severity/associated sxs/prior treatment) HPI Complains of frontal headache and bilateral visual field cut onset 3 days ago symptoms accompanied by a clumsy feeling in his left hand yesterday.. patient feels the clumsiness in his left hand was due to inability to see. Visual symptoms accompanied by floaters which she's experienced in the past which have been self-limiting after approximately 30 minutes. Patient seen by ophthalmologist Dr.Yokum earlier today treated with his eyes being dilated. Dr.Youkum feels that patient likely had an occipital stroke. Presently patient complains of frontal headache no other complaint. Vision is not yet back to normal however he has recently had eyes dilated he sees no floaters presently. No other associated symptoms. Headache is described as sharp in quality moderate to severe, nonradiating Past Medical History  Diagnosis Date  . HTN (hypertension)   . HLD (hyperlipidemia)   . Pulmonary nodule, right     lower lobe  . COPD (chronic obstructive pulmonary disease)   . Rotator cuff injury     right  . Frozen shoulder   . Shoulder pain   . Hyperkalemia   . Dermatitis seborrheica   . Other diseases of lung, not elsewhere classified   . Kidney stone   . Allergic rhinitis   . ED (erectile dysfunction)   . Diabetes mellitus type II   . Asthma   . Tobacco abuse     Past Surgical History  Procedure Date  . Cervical discectomy 1998  . Ett 11/1994  . Tm repair     Family History  Problem Relation Age of Onset  . Asthma Sister   . Emphysema Sister     History  Substance Use Topics  . Smoking status: Current Everyday Smoker -- 0.5 packs/day    Types: Cigarettes  . Smokeless tobacco: Never Used   Comment: prev smoked 1 1/2 PPD. Currently 5 cigs daily  . Alcohol Use: No     Quit 40 years ago      Review of Systems  Unable to perform ROS Eyes: Positive for visual disturbance.  Neurological: Positive for headaches.  All other systems reviewed and are negative.    Allergies  Augmentin; Quinapril hcl; Valsartan; and Varenicline tartrate  Home Medications   Current Outpatient Rx  Name Route Sig Dispense Refill  . ALBUTEROL SULFATE (2.5 MG/3ML) 0.083% IN NEBU Nebulization Take 2.5 mg by nebulization 2 (two) times daily as needed.      [name] AMLODIPINE BESYLATE 5 MG PO TABS Oral Take 5 mg by mouth daily.      . ATORVASTATIN CALCIUM 10 MG PO TABS Oral Take 10 mg by mouth daily.      [name] FLUTICASONE-SALMETEROL 250-50 MCG/DOSE IN AEPB Inhalation Inhale 1 puff into the lungs every 12 (twelve) hours. 60 each 3  . GLIPIZIDE XL 10 MG PO TB24  TAKE 1 TABLET ONCE DAILY 90 tablet 0    Needs appointment with Dr for any additional refil ...  . IBUPROFEN 200 MG PO TABS Oral Take 400 mg by mouth every 6 (six) hours as needed. As needed for pain.    . INSULIN GLARGINE 100 UNIT/ML [place] SOLN Subcutaneous Inject 10 Units into the skin every evening. 1800    . METFORMIN HCL 1000 MG PO TABS  Oral Take 1 tablet (1,000 mg total) by mouth as directed. 1 at breakfast; 1/2 at lunch and 1 in evening 270 tablet 2  . MOMETASONE FUROATE 50 MCG/ACT NA SUSP Nasal 2 sprays by Nasal route daily.      [name] VARDENAFIL HCL 10 MG PO TABS Oral Take 10 mg by mouth daily as needed.      . VENTOLIN HFA 108 (90 BASE) MCG/ACT IN AERS  USE 2 PUFFS EVERY 4 HOURS AS NEEDED FOR WHEEZE 1 Inhaler 11  . GLUCOSE BLOOD VI STRP Other 1 each by Other route 2 (two) times daily. Use as instructed     . PEN NEEDLES 5/16" 30G X 8 MM MISC Does not apply by Does not apply route as directed.      [name] LANCETS 28G MISC Does not apply by Does not apply route 2 (two) times daily.        BP 101/86  Pulse 84  Temp(Src) 97.5 F (36.4 C) (Oral)  Resp 18   SpO2 95%  Physical Exam  Nursing note and vitals reviewed. Constitutional: He appears well-developed and well-nourished.  HENT:  Head: Normocephalic and atraumatic.  Eyes: Conjunctivae are normal. Pupils are equal, round, and reactive to light.  Neck: Neck supple. No tracheal deviation present. No thyromegaly present.  Cardiovascular: Normal rate and regular rhythm.   No murmur heard. Pulmonary/Chest: Effort normal and breath sounds normal.  Abdominal: Soft. Bowel sounds are normal. He exhibits no distension. There is no tenderness.  Musculoskeletal: Normal range of motion. He exhibits no edema and no tenderness.  Neurological: He is alert. He has normal reflexes. No cranial nerve deficit. Coordination normal.       Motor strength 5 over 5 overall gait normal Romberg normal pronator drift normal  Skin: Skin is warm and dry. No rash noted.  Psychiatric: He has a normal mood and affect. His behavior is normal.    ED Course  Procedures (including critical care time) 5:50 PM pain improved after treatment with Vicodin patient alert appropriate Glasgow Coma Score 15 Labs Reviewed - No data to display No results found.   Date: 12/01/2011  Rate: 70  Rhythm: normal sinus rhythm  QRS Axis: normal  Intervals: normal  ST/T Wave abnormalities: normal  Conduction Disutrbances:none  Narrative Interpretation:   Old EKG Reviewed: unchanged No significant change from 05/18/2009 No diagnosis found.  Results for orders placed during the hospital encounter of 12/01/11  CBC      Component Value Range   WBC 9.1  4.0 - 10.5 (K/uL)   RBC 5.92 (*) 4.22 - 5.81 (MIL/uL)   Hemoglobin 18.3 (*) 13.0 - 17.0 (g/dL)   HCT 16.1  09.6 - 04.5 (%)   MCV 86.0  78.0 - 100.0 (fL)   MCH 30.9  26.0 - 34.0 (pg)   MCHC 36.0  30.0 - 36.0 (g/dL)   RDW 40.9  81.1 - 91.4 (%)   Platelets 182  150 - 400 (K/uL)  DIFFERENTIAL      Component Value Range   Neutrophils Relative 66  43 - 77 (%)   Neutro Abs 6.0  1.7 -  7.7 (K/uL)   Lymphocytes Relative 23  12 - 46 (%)   Lymphs Abs 2.1  0.7 - 4.0 (K/uL)   Monocytes Relative 8  3 - 12 (%)   Monocytes Absolute 0.7  0.1 - 1.0 (K/uL)   Eosinophils Relative 2  0 - 5 (%)   Eosinophils Absolute 0.2  0.0 - 0.7 (K/uL)  Basophils Relative 0  0 - 1 (%)   Basophils Absolute 0.0  0.0 - 0.1 (K/uL)  POCT I-STAT, CHEM 8      Component Value Range   Sodium 137  135 - 145 (mEq/L)   Potassium 4.3  3.5 - 5.1 (mEq/L)   Chloride 103  96 - 112 (mEq/L)   BUN 16  6 - 23 (mg/dL)   Creatinine, Ser 1.30  0.50 - 1.35 (mg/dL)   Glucose, Bld 865 (*) 70 - 99 (mg/dL)   Calcium, Ion 7.84  6.96 - 1.32 (mmol/L)   TCO2 27  0 - 100 (mmol/L)   Hemoglobin 18.7 (*) 13.0 - 17.0 (g/dL)   HCT 29.5 (*) 28.4 - 52.0 (%)   Mr Shirlee Latch Wo Contrast  12/01/2011  *RADIOLOGY REPORT*  Clinical Data:  71 year old male with headache, blurred vision.  Comparison: None.  MRI HEAD WITHOUT CONTRAST  Technique: Multiplanar, multiecho pulse sequences of the brain and surrounding structures were obtained according to standard protocol without intravenous contrast.  Findings: Scattered small cortical and subcortical white matter infarcts with restricted diffusion in the right hemisphere are wide spread affecting all of the posterior right temporal, occipital, parietal, and superior right frontal lobes.  The individual lesions are small and without hemorrhage or significant mass effect. Subtle associated T2 and FLAIR hyperintensity is associated. No posterior fossa or left hemisphere diffusion abnormality.  Major intracranial vascular flow voids are preserved.  Superimposed on a acute findings are widespread foci of cerebral white matter T2 and FLAIR hyperintensity.  Partially empty sella.  No ventriculomegaly. No acute intracranial hemorrhage identified.  No extra-axial collection.  Negative cervicomedullary junction and visualized cervical spine.  Normal bone marrow signal.  Paranasal sinus and right mastoid inflammatory  changes.  Negative visualized nasopharynx. Visualized orbit soft tissues are within normal limits.  Negative scalp soft tissues.  IMPRESSION: 1.  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 2.  No associated mass effect or hemorrhage.  Chronic underlying nonspecific cerebral white matter signal changes.  MRA HEAD WITHOUT CONTRAST  Technique: Angiographic images of the Circle of Willis were obtained using MRA technique without  intravenous contrast.  Findings: Dominant distal right vertebral artery primarily supplies the basilar.  Poor visualization of the diminutive distal left vertebral artery.  Normal right PICA.  Dominant left AICA.  No basilar stenosis.  Superior cerebellar arteries are within normal limits.  Both PCA have fetal origins, that on the right explaining the infarct pattern on the basis of right carotid disease.  No proximal PCA stenosis.  Visualized bilateral PCA branches are within normal limits.  Antegrade flow in both ICA siphons which are irregular.  No significant left ICA stenosis.  There is a possible filling defect in the proximal cavernous right ICA (series 6 image 75). I favor this is artifact.  The supraclinoid right ICA is patent.  No other hemodynamically significant ICA stenosis occurs. Both ophthalmic and posterior communicating artery origins are within normal limits.  The carotid termini are patent.  The left ACA A1 segment is dominant.  Normal anterior communicating artery.  Median artery of the corpus callosum is present.  Visualized ACA branches are within normal limits.  Left MCA M1 segment is irregular but without significant stenosis.  Visualized left MCA branches are within normal limits.  The right MCA M1 segment is  within normal limits.  The right MCA bifurcation is patent.  No major right MCA  branch occlusion or high- grade stenosis is identified.  MRA source images do suggest decreased flow in small posterior sylvian division branches (series 6 image 118).  IMPRESSION: 1.  Fetal PCAs, congruent with the MRI findings suggestive of right carotid disease. 2.  Bilateral ICA atherosclerosis.  Possible filling defect or severe plaque in the right cavernous ICA segment which if genuine causes severe focal stenosis. 3.  Otherwise no proximal anterior circulation stenosis or major branch occlusion.  Decreased flow signal suspected in the right MCA M3 posterior divisions.  Original Report Authenticated By: Harley Hallmark, M.D.   Mr Brain Wo Contrast  12/01/2011  *RADIOLOGY REPORT*  Clinical Data:  71 year old male with headache, blurred vision.  Comparison: None.  MRI HEAD WITHOUT CONTRAST  Technique: Multiplanar, multiecho pulse sequences of the brain and surrounding structures were obtained according to standard protocol without intravenous contrast.  Findings: Scattered small cortical and subcortical white matter infarcts with restricted diffusion in the right hemisphere are wide spread affecting all of the posterior right temporal, occipital, parietal, and superior right frontal lobes.  The individual lesions are small and without hemorrhage or significant mass effect. Subtle associated T2 and FLAIR hyperintensity is associated. No posterior fossa or left hemisphere diffusion abnormality.  Major intracranial vascular flow voids are preserved.  Superimposed on a acute findings are widespread foci of cerebral white matter T2 and FLAIR hyperintensity.  Partially empty sella.  No ventriculomegaly. No acute intracranial hemorrhage identified.  No extra-axial collection.  Negative cervicomedullary junction and visualized cervical spine.  Normal bone marrow signal.  Paranasal sinus and right mastoid inflammatory changes.  Negative visualized nasopharynx. Visualized orbit soft tissues are within normal limits.   Negative scalp soft tissues.  IMPRESSION: 1.  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 2.  No associated mass effect or hemorrhage.  Chronic underlying nonspecific cerebral white matter signal changes.  MRA HEAD WITHOUT CONTRAST  Technique: Angiographic images of the Circle of Willis were obtained using MRA technique without  intravenous contrast.  Findings: Dominant distal right vertebral artery primarily supplies the basilar.  Poor visualization of the diminutive distal left vertebral artery.  Normal right PICA.  Dominant left AICA.  No basilar stenosis.  Superior cerebellar arteries are within normal limits.  Both PCA have fetal origins, that on the right explaining the infarct pattern on the basis of right carotid disease.  No proximal PCA stenosis.  Visualized bilateral PCA branches are within normal limits.  Antegrade flow in both ICA siphons which are irregular.  No significant left ICA stenosis.  There is a possible filling defect in the proximal cavernous right ICA (series 6 image 75). I favor this is artifact.  The supraclinoid right ICA is patent.  No other hemodynamically significant ICA stenosis occurs. Both ophthalmic and posterior communicating artery origins are within normal limits.  The carotid termini are patent.  The left ACA A1 segment is dominant.  Normal anterior communicating artery.  Median artery of the corpus callosum is present.  Visualized ACA branches are within normal limits.  Left MCA M1 segment is irregular but without significant stenosis.  Visualized left MCA branches are within normal limits.  The right MCA M1 segment is within normal limits.  The right MCA bifurcation is  patent.  No major right MCA branch occlusion or high- grade stenosis is identified.  MRA source images do suggest decreased  flow in small posterior sylvian division branches (series 6 image 118).  IMPRESSION: 1.  Fetal PCAs, congruent with the MRI findings suggestive of right carotid disease. 2.  Bilateral ICA atherosclerosis.  Possible filling defect or severe plaque in the right cavernous ICA segment which if genuine causes severe focal stenosis. 3.  Otherwise no proximal anterior circulation stenosis or major branch occlusion.  Decreased flow signal suspected in the right MCA M3 posterior divisions.  Original Report Authenticated By: Harley Hallmark, M.D.     MDM  Spoke with hospitalist physician plan admit to neuro telemetry floor Neurology consult Diagnosis acute stroke #2 hyperglycemia        Doug Sou, MD 12/01/11 1755

## 2011-12-02 ENCOUNTER — Inpatient Hospital Stay (HOSPITAL_COMMUNITY): Payer: Medicare Other

## 2011-12-02 DIAGNOSIS — I359 Nonrheumatic aortic valve disorder, unspecified: Secondary | ICD-10-CM

## 2011-12-02 LAB — LIPID PANEL
Cholesterol: 142 mg/dL (ref 0–200)
HDL: 39 mg/dL — ABNORMAL LOW (ref >39)
LDL Cholesterol: UNDETERMINED mg/dL (ref 0–99)
Total CHOL/HDL Ratio: 3.6 RATIO
Triglycerides: 442 mg/dL — ABNORMAL HIGH (ref <150)
VLDL: UNDETERMINED mg/dL (ref 0–40)

## 2011-12-02 LAB — HEMOGLOBIN A1C
Hgb A1c MFr Bld: 11.7 % — ABNORMAL HIGH (ref <5.7)
Mean Plasma Glucose: 289 mg/dL — ABNORMAL HIGH (ref <117)

## 2011-12-02 LAB — GLUCOSE, CAPILLARY
Glucose-Capillary: 201 mg/dL — ABNORMAL HIGH (ref 70–99)
Glucose-Capillary: 333 mg/dL — ABNORMAL HIGH (ref 70–99)
Glucose-Capillary: 277 mg/dL — ABNORMAL HIGH (ref 70–99)
Glucose-Capillary: 301 mg/dL — ABNORMAL HIGH (ref 70–99)

## 2011-12-02 MED ORDER — HYDROCODONE-ACETAMINOPHEN 5-325 MG PO TABS
2.0000 | ORAL_TABLET | Freq: Four times a day (QID) | ORAL | Status: DC | PRN
  Administered 2011-12-03: 2 via ORAL
  Filled 2011-12-02: qty 2

## 2011-12-02 MED ORDER — DIPHENHYDRAMINE HCL 50 MG/ML IJ SOLN
12.5000 mg | Freq: Once | INTRAMUSCULAR | Status: AC
  Administered 2011-12-02: 12.5 mg via INTRAVENOUS
  Filled 2011-12-02 (×2): qty 0.25

## 2011-12-02 NOTE — Plan of Care (Signed)
Problem: Phase II Progression Outcomes Goal: Able to communicate Speech-language-cognitive assessment completed.  No ST needs at this time.  Please see full report for details.  Jonathan [name] [place].Ed [employer] [phone]  12/02/2011

## 2011-12-02 NOTE — Progress Notes (Signed)
Inpatient Diabetes Program Recommendations  AACE/ADA: New Consensus Statement on Inpatient Glycemic Control (2009)  Target Ranges:  Prepandial:   less than 140 mg/dL      Peak postprandial:   less than 180 mg/dL (1-2 hours)      Critically ill patients:  140 - 180 mg/dL    Inpatient Diabetes Program Recommendations Insulin - Basal: start home dose Lantus 10 units  Fasting CBG=201 this morning.  Thank you  Piedad Climes RN,BSN,CDE Inpatient Diabetes Coordinator

## 2011-12-02 NOTE — Evaluation (Signed)
Physical Therapy Evaluation Patient Details Name: Jonathan [name] MRN: [id number] DOB: 1941/01/08 Today's Date: 12/02/2011  Problem List:  Patient Active Problem List  Diagnoses  . DIABETES MELLITUS, TYPE II  . HYPERLIPIDEMIA  . HYPERKALEMIA  . ERECTILE DYSFUNCTION  . HYPERTENSION  . ALLERGIC  RHINITIS  . ASTHMA  . COPD  . KIDNEY STONE  . DERMATITIS, SEBORRHEIC  . TOBACCO ABUSE, HX OF  . HTN (hypertension)  . HLD (hyperlipidemia)  . Pulmonary nodule, right  . COPD (chronic obstructive pulmonary disease)  . Rotator cuff injury  . Frozen shoulder  . Shoulder pain  . Hyperkalemia  . Dermatitis seborrheica  . Kidney stone  . Allergic rhinitis  . ED (erectile dysfunction)  . Diabetes mellitus type II  . Asthma  . Tobacco abuse  . CVA (cerebral infarction)    Past Medical History:  Past Medical History  Diagnosis Date  . HTN (hypertension)   . HLD (hyperlipidemia)   . Pulmonary nodule, right     lower lobe  . COPD (chronic obstructive pulmonary disease)   . Rotator cuff injury     right  . Frozen shoulder   . Shoulder pain   . Hyperkalemia   . Dermatitis seborrheica   . Other diseases of lung, not elsewhere classified   . Kidney stone   . Allergic rhinitis   . ED (erectile dysfunction)   . Diabetes mellitus type II   . Asthma   . Tobacco abuse    Past Surgical History:  Past Surgical History  Procedure Date  . Cervical discectomy 1998  . Ett 11/1994  . Tm repair     PT Assessment/Plan/Recommendation PT Assessment Clinical Impression Statement: Patient is a 71 y.o. male that was admitted for R CVA with L visual field impairments. PT Recommendation/Assessment: Patient will need skilled PT in the acute care venue PT Problem List: Decreased activity tolerance;Decreased balance;Decreased coordination;Decreased safety awareness Barriers to Discharge: None PT Therapy Diagnosis : Difficulty walking PT Plan PT Frequency: Min 4X/week PT  Treatment/Interventions: Gait training;DME instruction;Balance training;Stair training;Functional mobility training;Therapeutic activities;Therapeutic exercise;Neuromuscular re-education;Patient/family education PT Recommendation Follow Up Recommendations: Outpatient PT Equipment Recommended: None recommended by PT PT Goals  Acute Rehab PT Goals PT Goal Formulation: With patient Time For Goal Achievement: 7 days Pt will go Supine/Side to Sit: with modified independence PT Goal: Supine/Side to Sit - Progress: Goal set today Pt will go Sit to Supine/Side: with modified independence PT Goal: Sit to Supine/Side - Progress: Goal set today Pt will go Sit to Stand: with modified independence PT Goal: Sit to Stand - Progress: Goal set today Pt will go Stand to Sit: with modified independence PT Goal: Stand to Sit - Progress: Goal set today Pt will Ambulate: with supervision PT Goal: Ambulate - Progress: Goal set today Pt will Go Up / Down Stairs: with supervision PT Goal: Up/Down Stairs - Progress: Goal set today Additional Goals Additional Goal #1: Patient will improve his DGI by 4 points in order to demonstrate improved dynamic balance and to decrease fall risk. PT Goal: Additional Goal #1 - Progress: Goal set today  PT Evaluation Precautions/Restrictions  Precautions Precautions: Fall Prior Functioning  Home Living Lives With: Spouse Receives Help From: Family Type of Home: Mobile home Home Layout: One level Home Access: Stairs to enter Entrance Stairs-Rails: Can reach both Entrance Stairs-Number of Steps: 4 Bathroom Shower/Tub: Engineer, manufacturing systems: Standard Prior Function Level of Independence: Independent with basic ADLs;Independent with gait;Independent with transfers Able to Take Stairs?:  Yes Vocation: Retired (retired Naval architect) Cognition Cognition Arousal/Alertness: Awake/alert Overall Cognitive Status: Appears within functional limits  for tasks assessed Orientation Level: Oriented X4 Sensation/Coordination Sensation Light Touch: Appears Intact Proprioception: Appears Intact Coordination Gross Motor Movements are Fluid and Coordinated: No (Patient undershoots when reaching for objects with L UE.) Finger Nose Finger Test: [facility] Extremity Assessment RUE Assessment RUE Assessment: Within Functional Limits LUE Assessment LUE Assessment: Within Functional Limits RLE Assessment RLE Assessment: Within Functional Limits LLE Assessment LLE Assessment: Within Functional Limits Mobility (including Balance) Bed Mobility Bed Mobility: Yes Supine to Sit: 5: Supervision Supine to Sit Details (indicate cue type and reason): Supervision for safely Transfers Transfers: Yes Sit to Stand: From bed;With upper extremity assist;Other (comment) (Min guard) Ambulation/Gait Ambulation/Gait: Yes Ambulation/Gait Assistance: Other (comment) (Min guard) Ambulation/Gait Assistance Details (indicate cue type and reason): Patient veers to the L with ambulation especially when turning his head to the right. Ambulation Distance (Feet): 500 Feet Assistive device: None Gait Pattern:  (Occasional staggered gait pattern) Stairs: Yes Stairs Assistance: Other (comment) (Min guard) Stair Management Technique: Two rails;Forwards;Alternating pattern Number of Stairs: 5  Wheelchair Mobility Wheelchair Mobility: No  Posture/Postural Control Posture/Postural Control: No significant limitations Balance Balance Assessed: Yes Static Sitting Balance Static Sitting - Balance Support: No upper extremity supported;Feet supported Static Sitting - Level of Assistance: 7: Independent Dynamic Gait Index Level Surface: Moderate Impairment Change in Gait Speed: Moderate Impairment Gait with Horizontal Head Turns: Moderate Impairment Gait with Vertical Head Turns: Mild Impairment Gait and Pivot Turn: Mild Impairment Step Over Obstacle: Mild Impairment Step  Around Obstacles: Mild Impairment Steps: Moderate Impairment Total Score: 12  Exercise    End of Session PT - End of Session Equipment Utilized During Treatment: Gait belt Activity Tolerance: Patient tolerated treatment well (Patient with mild shortness of breath) Patient left: in bed;with call bell in reach;with family/visitor present (Sitting on edge of bed) General Behavior During Session: [facility] for tasks performed Cognition: Impaired Cognitive Impairment: Patient presents with decreased safety awareness.  Arora Coakley 12/02/2011, 1:59 PM  Jake Shark, PT DPT [phone]

## 2011-12-02 NOTE — Evaluation (Signed)
Speech Language Pathology Evaluation Patient Details Name: Jonathan [name] MRN: [id number] DOB: Nov 27, 1940 Today's Date: 12/02/2011  HPI:  71 yr old admitted with visual disturbance and headache.  MRI positive for numerous small right infarcts most likely embolic source.  Problem List:  Patient Active Problem List  Diagnoses  . DIABETES MELLITUS, TYPE II  . HYPERLIPIDEMIA  . HYPERKALEMIA  . ERECTILE DYSFUNCTION  . HYPERTENSION  . ALLERGIC  RHINITIS  . ASTHMA  . COPD  . KIDNEY STONE  . DERMATITIS, SEBORRHEIC  . TOBACCO ABUSE, HX OF  . HTN (hypertension)  . HLD (hyperlipidemia)  . Pulmonary nodule, right  . COPD (chronic obstructive pulmonary disease)  . Rotator cuff injury  . Frozen shoulder  . Shoulder pain  . Hyperkalemia  . Dermatitis seborrheica  . Kidney stone  . Allergic rhinitis  . ED (erectile dysfunction)  . Diabetes mellitus type II  . Asthma  . Tobacco abuse  . CVA (cerebral infarction)   Past Medical History:  Past Medical History  Diagnosis Date  . HTN (hypertension)   . HLD (hyperlipidemia)   . Pulmonary nodule, right     lower lobe  . COPD (chronic obstructive pulmonary disease)   . Rotator cuff injury     right  . Frozen shoulder   . Shoulder pain   . Hyperkalemia   . Dermatitis seborrheica   . Other diseases of lung, not elsewhere classified   . Kidney stone   . Allergic rhinitis   . ED (erectile dysfunction)   . Diabetes mellitus type II   . Asthma   . Tobacco abuse    Past Surgical History:  Past Surgical History  Procedure Date  . Cervical discectomy 1998  . Ett 11/1994  . Tm repair     SLP Assessment/Plan/Recommendation Assessment Clinical Impression Statement: Pt.'s speech-language and cognitive abilities are WFL's for tasks assessed.  Speech 100% intelligible.  Pt. complained of baseline difficulty recalling names.  He reports he utilizes a Dietitian and has a system to recall appointments etc.. No ST services needed at this  time.  Discussed option of home health ST if difficulties arise once home. SLP Recommendation/Assessment: Patient does not need any further Speech Lanaguage Pathology Services No Skilled Speech Therapy: Patient at baseline level of functioning;All education completed SLP Recommendations Follow up Recommendations: None Individuals Consulted Consulted and Agree with Results and Recommendations: Patient;Family member/caregiver  SLP Evaluation Prior Functioning Cognitive/Linguistic Baseline: Within functional limits Lives With: Spouse Vocation: Retired (retired Naval architect)  Cognition Overall Cognitive Status: Appears within functional limits for tasks assessed Orientation Level: Oriented X4 Memory:  (recalling tests he has had)  Comprehension Auditory Comprehension Overall Auditory Comprehension: Appears within functional limits for tasks assessed Commands: Within Functional Limits Visual Recognition/Discrimination Discrimination: Not tested Reading Comprehension Reading Status: Impaired Functional Environmental (signs, name badge): Impaired (difficulty reading menu.  C/O floaters that come and go) Interfering Components: Eye glasses not available;Visual acuity;Visual perceptual Effective Techniques: Verbal cueing;Tactile cueing  Expression Expression Primary Mode of Expression: Verbal Verbal Expression Overall Verbal Expression: Appears within functional limits for tasks assessed Pragmatics: No impairment Non-Verbal Means of Communication: Not applicable Written Expression Written Expression: Not tested  Oral/Motor Oral Motor/Sensory Function Overall Oral Motor/Sensory Function: Appears within functional limits for tasks assessed Motor Speech Overall Motor Speech: Appears within functional limits for tasks assessed Intelligibility: Intelligible Motor Planning: Witnin functional limits  Breck Coons [employer] [phone]  12/02/2011

## 2011-12-02 NOTE — Progress Notes (Signed)
History: Jonathan [name] is an 71 y.o. male white presenting in the ER with visual changes. The patient tells me that Friday which was about 3 days ago he began to have "floaters' in his eyes. These floaters were present in both eyes and lasted about 30-40 minutes at a time. The patient had these floaters on and off until now and continues to have them. Around Friday night/early Saturday morning the patient developed a headache which he describes as achy and pressure-like. The headache was located on the right side of his head and behind his right eye. Coughing made the headache worse. Using his hand to push against the side of his head and against his right eye relieves some of the pain. The patient did not have any nausea or vomiting with the headache. He did not have any dizziness/vertigo/lightheadedness.patient did not have any changes in speech nor did he have any numbness or tingling anywhere. The patient also did not have any weakness. He did have some light sensitivity but he says that that is long-standing. The patient did not have any changes in his headache with smell, movement or noise. Around the patient noticed some clumsiness with his left hand. He was unable to reach for things that he wanted to grab.  The patient is not on a daily blood thinner. His never had this happen before.   LSN: 11/28/2011  tPA Given: No: Outside time window for TPA treatment  Subjective: Reports doing ok today.  Vision seems to be improved, however, if he watches the TV awhile and looks away, colorful sports follow his gaze.  No headache, dizziness or difficulty swallowing.  Objective: BP 162/95  Pulse 77  Temp(Src) 97.9 F (36.6 C) (Oral)  Resp 18  Ht 5\' 8"  (1.727 m)  Wt 83.3 kg (183 lb 10.3 oz)  BMI 27.92 kg/m2  SpO2 95% Telemetry: SR  CBGs  Basename 12/02/11 0701 12/01/11 2235  GLUCAP 201* 350*    Diet: CHO, thin  Activity: up with assistance  DVT Prophylaxis: lovenox   Medications:  Scheduled:   . aspirin  300 mg Rectal Daily   Or  . aspirin  325 mg Oral Daily  . atorvastatin  10 mg Oral Daily  . enoxaparin  40 mg Subcutaneous Q24H  . fluticasone  1 spray Each Nare Daily  . Fluticasone-Salmeterol  1 puff Inhalation Q12H  . glipiZIDE  10 mg Oral Q breakfast  . HYDROcodone-acetaminophen  2 tablet Oral Once  . insulin aspart  0-9 Units Subcutaneous TID WC  . insulin aspart  5 Units Subcutaneous Once    Neurologic Exam: Mental Status: Alert, oriented, thought content appropriate.  Speech fluent without evidence of aphasia. Able to follow 3 step commands without difficulty. Cranial Nerves: II- Visual fields grossly intact. III/IV/VI-Extraocular movements intact.  Pupils reactive bilaterally. V/VII-Smile symmetric VIII-hearing grossly intact IX/X-normal gag XI-bilateral shoulder shrug XII-midline tongue extension Motor: 5/5 bilaterally with normal tone and bulk Sensory: Light touch intact throughout, bilaterally Deep Tendon Reflexes: 2+ and symmetric throughout Plantars: Downgoing bilaterally Cerebellar: Normal finger-to-nose, normal heel-to-shin test.  on R, min dysmetric on the L  Lab Results: Basic Metabolic Panel:  Lab 12/01/11 4098  NA 137  K 4.3  CL 103  CO2 --  GLUCOSE 269*  BUN 16  CREATININE 0.80  CALCIUM --  MG --  PHOS --   CBC:  Lab 12/01/11 1602 12/01/11 1458  WBC -- 9.1  NEUTROABS -- 6.0  HGB 18.7* 18.3*  HCT 55.0* 50.9  MCV -- 86.0  PLT -- 182   CBG:  Lab 12/02/11 0701 12/01/11 2235  GLUCAP 201* 350*   Fasting Lipid Panel:  Lab 12/02/11 0608  CHOL 142  HDL 39*  LDLCALC UNABLE TO CALCULATE IF TRIGLYCERIDE OVER 400 mg/dL  TRIG 161*  CHOLHDL 3.6  LDLDIRECT --   Study Results:  12/02/2011  CHEST - 2 VIEW  Findings: Upper-normal size of cardiac silhouette. Mediastinal contours and pulmonary vascularity normal. Emphysematous and minimal chronic bronchitic changes. No acute infiltrate, pleural effusion or pneumothorax. No  acute osseous findings. Question left nipple shadow.  IMPRESSION: Emphysematous and minimal chronic bronchitic changes. Question left nipple shadows; recommend repeat PA chest radiograph with nipple markers to exclude pulmonary nodule.   Lollie Marrow, M.D.   12/01/2011  MRI HEAD WITHOUT CONTRAST   Findings: Scattered small cortical and subcortical white matter infarcts with restricted diffusion in the right hemisphere are wide spread affecting all of the posterior right temporal, occipital, parietal, and superior right frontal lobes.  The individual lesions are small and without hemorrhage or significant mass effect. Subtle associated T2 and FLAIR hyperintensity is associated. No posterior fossa or left hemisphere diffusion abnormality.  Major intracranial vascular flow voids are preserved.  Superimposed on a acute findings are widespread foci of cerebral white matter T2 and FLAIR hyperintensity.  Partially empty sella.  No ventriculomegaly. No acute intracranial hemorrhage identified.  No extra-axial collection.  Negative cervicomedullary junction and visualized cervical spine.  Normal bone marrow signal.  Paranasal sinus and right mastoid inflammatory changes.  Negative visualized nasopharynx. Visualized orbit soft tissues are within normal limits.  Negative scalp soft tissues.  IMPRESSION: 1.  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 2.  No associated mass effect or hemorrhage.  Chronic underlying nonspecific cerebral white matter signal changes.  H.LEE HALL III, M.D.    12/01/11 MRA HEAD WITHOUT CONTRAST Findings: Dominant distal right vertebral artery primarily supplies the basilar.  Poor visualization of the diminutive distal left vertebral artery.  Normal right PICA.  Dominant left AICA.  No basilar stenosis.  Superior  cerebellar arteries are within normal limits.  Both PCA have fetal origins, that on the right explaining the infarct pattern on the basis of right carotid disease.  No proximal PCA stenosis.  Visualized bilateral PCA branches are within normal limits.  Antegrade flow in both ICA siphons which are irregular.  No significant left ICA stenosis.  There is a possible filling defect in the proximal cavernous right ICA (series 6 image 75). I favor this is artifact.  The supraclinoid right ICA is patent.  No other hemodynamically significant ICA stenosis occurs. Both ophthalmic and posterior communicating artery origins are within normal limits.  The carotid termini are patent.  The left ACA A1 segment is dominant.  Normal anterior communicating artery.  Median artery of the corpus callosum is present.  Visualized ACA branches are within normal limits.  Left MCA M1 segment is irregular but without significant stenosis.  Visualized left MCA branches are within normal limits.  The right MCA M1 segment is within normal limits.  The right MCA bifurcation is patent.  No major right MCA branch occlusion or high- grade stenosis is identified.  MRA source images do suggest decreased flow in small posterior sylvian division branches (series 6 image 118).  IMPRESSION: 1.  Fetal PCAs,  congruent with the MRI findings suggestive of right carotid disease. 2.  Bilateral ICA atherosclerosis.  Possible filling defect or severe plaque in the right cavernous ICA segment which if genuine causes severe focal stenosis. 3.  Otherwise no proximal anterior circulation stenosis or major branch occlusion.  Decreased flow signal suspected in the right MCA M3 posterior divisions.   H.LEE HALL III, M.D.   Therapies: PT: pending OT: pending SLP: no needs  Assessment 71 yo male with  1.  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 Most consistent with embolic etiology.  MRA  reveals possible filling defect or severe plaque in the right cavernous ICA segment which if genuine causes severe focal stenosis. Carotid Doppler and TCD pending.  On ASA 325mg . 2. Hyperlipidemia:LDL not calc due to Trig 440. 3. DM2- A1C pending 4. COPD- stable.  Plan: 1. 2D Echo 2. Carotid Doppler and TCD 3. TEE with [place] 4. Continue ASA.   Patient has been seen and examined by Dr. Pearlean Brownie who recommends above plan.   LOS: 1 day   Marya Fossa PA-C [facility] [phone] 12/02/2011  10:44 AM

## 2011-12-02 NOTE — Evaluation (Signed)
Occupational Therapy Evaluation Patient Details Name: Jonathan [name] MRN: [id number] DOB: [birth year] Today's Date: 12/02/2011  Problem List:  Patient Active Problem List  Diagnoses  . DIABETES MELLITUS, TYPE II  . HYPERLIPIDEMIA  . HYPERKALEMIA  . ERECTILE DYSFUNCTION  . HYPERTENSION  . ALLERGIC  RHINITIS  . ASTHMA  . COPD  . KIDNEY STONE  . DERMATITIS, SEBORRHEIC  . TOBACCO ABUSE, HX OF  . HTN (hypertension)  . HLD (hyperlipidemia)  . Pulmonary nodule, right  . COPD (chronic obstructive pulmonary disease)  . Rotator cuff injury  . Frozen shoulder  . Shoulder pain  . Hyperkalemia  . Dermatitis seborrheica  . Kidney stone  . Allergic rhinitis  . ED (erectile dysfunction)  . Diabetes mellitus type II  . Asthma  . Tobacco abuse  . CVA (cerebral infarction)    Past Medical History:  Past Medical History  Diagnosis Date  . HTN (hypertension)   . HLD (hyperlipidemia)   . Pulmonary nodule, right     lower lobe  . COPD (chronic obstructive pulmonary disease)   . Rotator cuff injury     right  . Frozen shoulder   . Shoulder pain   . Hyperkalemia   . Dermatitis seborrheica   . Other diseases of lung, not elsewhere classified   . Kidney stone   . Allergic rhinitis   . ED (erectile dysfunction)   . Diabetes mellitus type II   . Asthma   . Tobacco abuse    Past Surgical History:  Past Surgical History  Procedure Date  . Cervical discectomy 1998  . Ett 11/1994  . Tm repair     OT Assessment/Plan/Recommendation OT Assessment Clinical Impression Statement: 71 yo male presenting with Rt CVA with visual deficits on Lt side. Pt with balance deficits indicated by cut on Lt UE from walking into door knob PTA. OT to follow acutely and assess further. OT Recommendation/Assessment: Patient will need skilled OT in the acute care venue OT Problem List: Decreased activity tolerance;Impaired balance (sitting and/or standing);Impaired vision/perception;Decreased  coordination;Decreased safety awareness;Decreased knowledge of use of DME or AE;Decreased knowledge of precautions OT Therapy Diagnosis : Generalized weakness;Disturbance of vision OT Plan OT Frequency: Min 2X/week OT Treatment/Interventions: Self-care/ADL training;DME and/or AE instruction;Therapeutic activities;Patient/family education;Visual/perceptual remediation/compensation;Balance training OT Recommendation Follow Up Recommendations: Outpatient OT Equipment Recommended: None recommended by OT Individuals Consulted Consulted and Agree with Results and Recommendations: Family member/caregiver;Patient OT Goals Acute Rehab OT Goals OT Goal Formulation: With patient/family Time For Goal Achievement: 2 weeks ADL Goals Pt Will Perform Grooming: with modified independence;Standing at sink ADL Goal: Grooming - Progress: Goal set today Pt Will Transfer to Toilet: with modified independence;Regular height toilet ADL Goal: Toilet Transfer - Progress: Goal set today Pt Will Perform Toileting - Clothing Manipulation: with modified independence;Sitting on 3-in-1 or toilet ADL Goal: Toileting - Clothing Manipulation - Progress: Goal set today Pt Will Perform Toileting - Hygiene: with modified independence;Sit to stand from 3-in-1/toilet ADL Goal: Toileting - Hygiene - Progress: Goal set today Miscellaneous OT Goals Miscellaneous OT Goal #1: Pt will perform bed mobility with HOb <20 degrees no bed rail at MOd I as precursor to ADLS OT Goal: Miscellaneous Goal #1 - Progress: Goal set today Miscellaneous OT Goal #2: Pt will demonstrate compensatory strategy by rotating neck and scanning environment to locate 3 out 4 objects on Lt side without any v/c  OT Goal: Miscellaneous Goal #2 - Progress: Goal set today  OT Evaluation Precautions/Restrictions  Precautions Precautions: Fall Restrictions Weight Bearing  Restrictions: No Prior Functioning Home Living Lives With: Spouse Receives Help From:  Family Type of Home: Mobile home Home Layout: One level Home Access: Stairs to enter Entrance Stairs-Rails: Can reach both Entrance Stairs-Number of Steps: 4 Bathroom Shower/Tub: Engineer, manufacturing systems: Standard Home Adaptive Equipment: None Prior Function Level of Independence: Independent with basic ADLs;Independent with gait;Independent with transfers Able to Take Stairs?: Yes Driving: Yes (has not been driving lately) Vocation: Retired Comments: Spouse reports patient fearful riding in the car on the way to MD office before being admitted to [facility]. Reason for fear was pt reported that wife was driving in the middle of the road due to visual changes. Pt demonstrates Lt visual field deficits. Pt currently with cut on Lt UE with bandage from walking into a door knob ADL ADL Eating/Feeding: Simulated;Modified independent Where Assessed - Eating/Feeding: Edge of bed Grooming: Simulated;Teeth care;Modified independent Where Assessed - Grooming: Sitting, bed;Unsupported Lower Body Dressing: Simulated;Modified independent (demonstrates ability to cross BIL LE) Where Assessed - Lower Body Dressing: Sitting, bed;Unsupported Toilet Transfer: Simulated;Supervision/safety Toilet Transfer Method: Stand pivot Toilet Transfer Equipment: Regular height toilet (simulated EOB <>w/c transfer) ADL Comments: pt completed letter cancellation grid and demonstrated deficits on Lt side. Pt required paper to be positioned in rt visual field to locate missed letters. Master copy of exercise is in shadow chart. Pt 's wife states "sometimes he says the whole paper looks black." OT to attempt yellow paper exercise to compare a constrast background. Pt with pending CT scan and evaluation ended with w/c transfer to complete test. Pt demonstrates ability to cross BIL LE to perform LB dressing/ bathing. Balance to be further assessed next session Vision/Perception  Vision - History Baseline Vision: Wears  glasses only for reading Patient Visual Report: Blurring of vision;Overshooting;Peripheral vision impairment Vision - Assessment Vision Assessment: Vision tested Visual Fields: Impaired - to be further tested in functional context Cognition Cognition Arousal/Alertness: Awake/alert Overall Cognitive Status: Appears within functional limits for tasks assessed Orientation Level: Oriented X4 Sensation/Coordination Sensation Light Touch: Appears Intact Proprioception: Appears Intact Additional Comments: Pt asked if any sensation changes (numbness or tingling) pt reports NO Coordination Gross Motor Movements are Fluid and Coordinated: Yes Fine Motor Movements are Fluid and Coordinated: Yes Finger Nose Finger Test: [facility] Extremity Assessment RUE Assessment RUE Assessment: Within Functional Limits LUE Assessment LUE Assessment: Within Functional Limits Mobility  Bed Mobility Bed Mobility: Yes Supine to Sit: 4: Min assist;HOB flat Supine to Sit Details (indicate cue type and reason): pt required min v/c for hand placement on Lt rail and (A) due to decreased core strength Transfers Transfers: Yes Sit to Stand: With upper extremity assist;From bed (Min guard) Exercises   End of Session OT - End of Session Activity Tolerance: Patient tolerated treatment well Patient left: in chair;with call bell in reach (w/c for procedure) Nurse Communication: Mobility status for transfers General Behavior During Session: [facility] for tasks performed Cognition: [facility] for tasks performed Cognitive Impairment: Patient presents with decreased safety awareness.  Ot to assess visual deficits further next session. Recommend follow up with optometrist at d/c.   Lucile Shutters 12/02/2011, 3:07 PM

## 2011-12-02 NOTE — Progress Notes (Signed)
  Echocardiogram 2D Echocardiogram has been performed.  Cathie Beams Deneen 12/02/2011, 10:13 AM

## 2011-12-02 NOTE — Progress Notes (Signed)
Utilization review completed.  

## 2011-12-02 NOTE — Progress Notes (Signed)
Subjective: Patient seen and examined this am. Still has some floaters in his eyes and some difficulty reaching out to objects with his left hand as they appear closer than they are.  Denies any weakness  Objective:  Vital signs in last 24 hours:  Filed Vitals:   12/02/11 0830 12/02/11 0858 12/02/11 1024 12/02/11 1115  BP: 122/78  162/95 151/92  Pulse: 67  77   Temp: 97.8 F (36.6 C)  97.9 F (36.6 C)   TempSrc: Oral  Oral   Resp: 17  18   Height:      Weight:      SpO2: 92% 92% 95%     Intake/Output from previous day:   Intake/Output Summary (Last 24 hours) at 12/02/11 1306 Last data filed at 12/02/11 0300  Gross per 24 hour  Intake      0 ml  Output    300 ml  Net   -300 ml    Physical Exam:  General: elderly male  in no acute distress. HEENT: no pallor, no icterus, moist oral mucosa, no JVD, no lymphadenopathy Heart: Normal  s1 &s2  Regular rate and rhythm, without murmurs, rubs, gallops. Lungs: Clear to auscultation bilaterally. Abdomen: Soft, nontender, nondistended, positive bowel sounds. Extremities: No clubbing cyanosis or edema with positive pedal pulses. Neuro: Alert, awake, oriented x3, cranial nerves II-XII intact, normal motor tone power and reflexes, normal sensory fn.   Lab Results:  Basic Metabolic Panel:    Component Value Date/Time   NA 137 12/01/2011 1602   K 4.3 12/01/2011 1602   CL 103 12/01/2011 1602   CO2 29 01/21/2011 0905   BUN 16 12/01/2011 1602   CREATININE 0.80 12/01/2011 1602   GLUCOSE 269* 12/01/2011 1602   CALCIUM 9.6 01/21/2011 0905   CBC:    Component Value Date/Time   WBC 9.1 12/01/2011 1458   HGB 18.7* 12/01/2011 1602   HCT 55.0* 12/01/2011 1602   PLT 182 12/01/2011 1458   MCV 86.0 12/01/2011 1458   NEUTROABS 6.0 12/01/2011 1458   LYMPHSABS 2.1 12/01/2011 1458   MONOABS 0.7 12/01/2011 1458   EOSABS 0.2 12/01/2011 1458   BASOSABS 0.0 12/01/2011 1458    No results found for this or any previous visit (from the past 240  hour(s)).  Studies/Results: Dg Chest 2 View  12/02/2011  *RADIOLOGY REPORT*  Clinical Data: Stroke, blurred vision, COPD, hypertension, diabetes, asthma, smoking history  CHEST - 2 VIEW  Comparison: 05/03/2010  Findings: Upper-normal size of cardiac silhouette. Mediastinal contours and pulmonary vascularity normal. Emphysematous and minimal chronic bronchitic changes. No acute infiltrate, pleural effusion or pneumothorax. No acute osseous findings. Question left nipple shadow.  IMPRESSION: Emphysematous and minimal chronic bronchitic changes. Question left nipple shadows; recommend repeat PA chest radiograph with nipple markers to exclude pulmonary nodule.  Original Report Authenticated By: MARK A. BOLES, M.D.   Mr Mra Head Wo Contrast  12/01/2011  *RADIOLOGY REPORT*  Clinical Data:  71-year-old male with headache, blurred vision.  Comparison: None.  MRI HEAD WITHOUT CONTRAST  Technique: Multiplanar, multiecho pulse sequences of the brain and surrounding structures were obtained according to standard protocol without intravenous contrast.  Findings: Scattered small cortical and subcortical white matter infarcts with restricted diffusion in the right hemisphere are wide spread affecting all of the posterior right temporal, occipital, parietal, and superior right frontal lobes.  The individual lesions are small and without hemorrhage or significant mass effect. Subtle associated T2 and FLAIR hyperintensity is associated. No posterior fossa or left   hemisphere diffusion abnormality.  Major intracranial vascular flow voids are preserved.  Superimposed on a acute findings are widespread foci of cerebral white matter T2 and FLAIR hyperintensity.  Partially empty sella.  No ventriculomegaly. No acute intracranial hemorrhage identified.  No extra-axial collection.  Negative cervicomedullary junction and visualized cervical spine.  Normal bone marrow signal.  Paranasal sinus and right mastoid inflammatory changes.   Negative visualized nasopharynx. Visualized orbit soft tissues are within normal limits.  Negative scalp soft tissues.  IMPRESSION: 1.  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 2.  No associated mass effect or hemorrhage.  Chronic underlying nonspecific cerebral white matter signal changes.  MRA HEAD WITHOUT CONTRAST  Technique: Angiographic images of the Circle of Willis were obtained using MRA technique without  intravenous contrast.  Findings: Dominant distal right vertebral artery primarily supplies the basilar.  Poor visualization of the diminutive distal left vertebral artery.  Normal right PICA.  Dominant left AICA.  No basilar stenosis.  Superior cerebellar arteries are within normal limits.  Both PCA have fetal origins, that on the right explaining the infarct pattern on the basis of right carotid disease.  No proximal PCA stenosis.  Visualized bilateral PCA branches are within normal limits.  Antegrade flow in both ICA siphons which are irregular.  No significant left ICA stenosis.  There is a possible filling defect in the proximal cavernous right ICA (series 6 image 75). I favor this is artifact.  The supraclinoid right ICA is patent.  No other hemodynamically significant ICA stenosis occurs. Both ophthalmic and posterior communicating artery origins are within normal limits.  The carotid termini are patent.  The left ACA A1 segment is dominant.  Normal anterior communicating artery.  Median artery of the corpus callosum is present.  Visualized ACA branches are within normal limits.  Left MCA M1 segment is irregular but without significant stenosis.  Visualized left MCA branches are within normal limits.  The right MCA M1 segment is within normal limits.  The right MCA bifurcation is patent.  No major right MCA branch  occlusion or high- grade stenosis is identified.  MRA source images do suggest decreased flow in small posterior sylvian division branches (series 6 image 118).  IMPRESSION: 1.  Fetal PCAs, congruent with the MRI findings suggestive of right carotid disease. 2.  Bilateral ICA atherosclerosis.  Possible filling defect or severe plaque in the right cavernous ICA segment which if genuine causes severe focal stenosis. 3.  Otherwise no proximal anterior circulation stenosis or major branch occlusion.  Decreased flow signal suspected in the right MCA M3 posterior divisions.  Original Report Authenticated By: H.LEE HALL III, M.D.   Mr Brain Wo Contrast  12/01/2011  *RADIOLOGY REPORT*  Clinical Data:  71-year-old male with headache, blurred vision.  Comparison: None.  MRI HEAD WITHOUT CONTRAST  Technique: Multiplanar, multiecho pulse sequences of the brain and surrounding structures were obtained according to standard protocol without intravenous contrast.  Findings: Scattered small cortical and subcortical white matter infarcts with restricted diffusion in the right hemisphere are wide spread affecting all of the posterior right temporal, occipital, parietal, and superior right frontal lobes.  The individual lesions are small and without hemorrhage or significant mass effect. Subtle associated T2 and FLAIR hyperintensity is associated. No posterior fossa or left hemisphere diffusion abnormality.  Major intracranial vascular flow voids   are preserved.  Superimposed on a acute findings are widespread foci of cerebral white matter T2 and FLAIR hyperintensity.  Partially empty sella.  No ventriculomegaly. No acute intracranial hemorrhage identified.  No extra-axial collection.  Negative cervicomedullary junction and visualized cervical spine.  Normal bone marrow signal.  Paranasal sinus and right mastoid inflammatory changes.  Negative visualized nasopharynx. Visualized orbit soft tissues are within normal limits.  Negative  scalp soft tissues.  IMPRESSION: 1.  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 2.  No associated mass effect or hemorrhage.  Chronic underlying nonspecific cerebral white matter signal changes.  MRA HEAD WITHOUT CONTRAST  Technique: Angiographic images of the Circle of Willis were obtained using MRA technique without  intravenous contrast.  Findings: Dominant distal right vertebral artery primarily supplies the basilar.  Poor visualization of the diminutive distal left vertebral artery.  Normal right PICA.  Dominant left AICA.  No basilar stenosis.  Superior cerebellar arteries are within normal limits.  Both PCA have fetal origins, that on the right explaining the infarct pattern on the basis of right carotid disease.  No proximal PCA stenosis.  Visualized bilateral PCA branches are within normal limits.  Antegrade flow in both ICA siphons which are irregular.  No significant left ICA stenosis.  There is a possible filling defect in the proximal cavernous right ICA (series 6 image 75). I favor this is artifact.  The supraclinoid right ICA is patent.  No other hemodynamically significant ICA stenosis occurs. Both ophthalmic and posterior communicating artery origins are within normal limits.  The carotid termini are patent.  The left ACA A1 segment is dominant.  Normal anterior communicating artery.  Median artery of the corpus callosum is present.  Visualized ACA branches are within normal limits.  Left MCA M1 segment is irregular but without significant stenosis.  Visualized left MCA branches are within normal limits.  The right MCA M1 segment is within normal limits.  The right MCA bifurcation is patent.  No major right MCA branch occlusion or high- grade stenosis is identified.  MRA source images do suggest decreased flow in  small posterior sylvian division branches (series 6 image 118).  IMPRESSION: 1.  Fetal PCAs, congruent with the MRI findings suggestive of right carotid disease. 2.  Bilateral ICA atherosclerosis.  Possible filling defect or severe plaque in the right cavernous ICA segment which if genuine causes severe focal stenosis. 3.  Otherwise no proximal anterior circulation stenosis or major branch occlusion.  Decreased flow signal suspected in the right MCA M3 posterior divisions.  Original Report Authenticated By: H.LEE HALL III, M.D.    Medications: Scheduled Meds:   . aspirin  300 mg Rectal Daily   Or  . aspirin  325 mg Oral Daily  . atorvastatin  10 mg Oral Daily  . enoxaparin  40 mg Subcutaneous Q24H  . fluticasone  1 spray Each Nare Daily  . Fluticasone-Salmeterol  1 puff Inhalation Q12H  . glipiZIDE  10 mg Oral Q breakfast  . HYDROcodone-acetaminophen  2 tablet Oral Once  . insulin aspart  0-9 Units Subcutaneous TID WC  . insulin aspart  5 Units Subcutaneous Once   Continuous Infusions:   . sodium chloride 50 mL/hr at 12/01/11 2105   PRN Meds:.acetaminophen, acetaminophen, albuterol, ondansetron (ZOFRAN) IV, senna-docusate  Assessment 70 y/o male with hx of tobacco   abuse, COPD, HTN, DM , HL admitted with floaters in the eyes for past  3 days with findings of numerous rt MCA and PCA territory infarcts.    Plan:  *CVA (cerebral infarction) Patient has visual symptoms in the form of floaters only  cont monitor in telemetry  neuro checks  MRI /MRA results as above 2D echo, carotid dopplers pending TEE as per neuro recs. 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 Will follow   DVT prophylaxis  Cardiac/ diabetic diet  Full code  Plan discussed with patient and his wife.   Consult: Dr Sethi ( neurology)  Tobacco abuse    LOS: 1 day   Tylyn Stankovich 12/02/2011, 1:06 PM  

## 2011-12-03 ENCOUNTER — Encounter (HOSPITAL_COMMUNITY)
Admission: EM | Disposition: A | Discharge status: Home / Self Care | Payer: Self-pay | Source: Home / Self Care | Attending: Internal Medicine

## 2011-12-03 ENCOUNTER — Encounter (HOSPITAL_COMMUNITY): Payer: Self-pay | Admitting: *Deleted

## 2011-12-03 DIAGNOSIS — I6789 Other cerebrovascular disease: Secondary | ICD-10-CM

## 2011-12-03 HISTORY — PX: TEE WITHOUT CARDIOVERSION: SHX5443

## 2011-12-03 LAB — GLUCOSE, CAPILLARY
Glucose-Capillary: 234 mg/dL — ABNORMAL HIGH (ref 70–99)
Glucose-Capillary: 311 mg/dL — ABNORMAL HIGH (ref 70–99)
Glucose-Capillary: 238 mg/dL — ABNORMAL HIGH (ref 70–99)
Glucose-Capillary: 330 mg/dL — ABNORMAL HIGH (ref 70–99)

## 2011-12-03 SURGERY — ECHOCARDIOGRAM, TRANSESOPHAGEAL
Anesthesia: Moderate Sedation

## 2011-12-03 MED ORDER — SODIUM CHLORIDE 0.9 % IJ SOLN: 3.0000 mL | INTRAMUSCULAR | Status: DC | PRN

## 2011-12-03 MED ORDER — BUTAMBEN-TETRACAINE-BENZOCAINE 2-2-14 % EX AERO
INHALATION_SPRAY | CUTANEOUS | Status: DC | PRN
  Administered 2011-12-03: 2 via TOPICAL

## 2011-12-03 MED ORDER — SODIUM CHLORIDE 0.9 % IJ SOLN
3.0000 mL | Freq: Two times a day (BID) | INTRAMUSCULAR | Status: DC
  Administered 2011-12-03 (×2): 3 mL via INTRAVENOUS

## 2011-12-03 MED ORDER — MIDAZOLAM HCL 10 MG/2ML IJ SOLN: 10.0000 mg | Freq: Once | INTRAMUSCULAR | Status: DC

## 2011-12-03 MED ORDER — FENTANYL CITRATE 0.05 MG/ML IJ SOLN: 250.0000 ug | Freq: Once | INTRAMUSCULAR | Status: DC

## 2011-12-03 MED ORDER — INSULIN GLARGINE 100 UNIT/ML ~~LOC~~ SOLN
10.0000 [IU] | Freq: Every day | SUBCUTANEOUS | Status: DC
  Administered 2011-12-03: 10 [IU] via SUBCUTANEOUS
  Filled 2011-12-03 (×2): qty 3

## 2011-12-03 MED ORDER — MIDAZOLAM HCL 10 MG/2ML IJ SOLN
INTRAMUSCULAR | Status: DC | PRN
  Administered 2011-12-03 (×2): 2 mg via INTRAVENOUS

## 2011-12-03 MED ORDER — SODIUM CHLORIDE 0.45 % IV SOLN
INTRAVENOUS | Status: DC
  Administered 2011-12-03: 500 mL via INTRAVENOUS

## 2011-12-03 MED ORDER — MIDAZOLAM HCL 10 MG/2ML IJ SOLN
INTRAMUSCULAR | Status: AC
  Filled 2011-12-03: qty 2

## 2011-12-03 MED ORDER — FENTANYL CITRATE 0.05 MG/ML IJ SOLN
INTRAMUSCULAR | Status: AC
  Filled 2011-12-03: qty 2

## 2011-12-03 MED ORDER — FENTANYL CITRATE 0.05 MG/ML IJ SOLN
INTRAMUSCULAR | Status: DC | PRN
  Administered 2011-12-03 (×2): 25 ug via INTRAVENOUS

## 2011-12-03 MED ORDER — BENZOCAINE 20 % MT SOLN
1.0000 "application " | OROMUCOSAL | Status: DC | PRN
  Filled 2011-12-03: qty 57

## 2011-12-03 NOTE — Progress Notes (Signed)
Physical Therapy Treatment Patient Details Name: MARLON SULEIMAN MRN: [id number] DOB: 1941-09-25 Today's Date: 12/03/2011  PT Assessment/Plan  PT - Assessment/Plan Comments on Treatment Session: Pt needed motivation to get OOB and ambulate.  During session OT present and performed visual perception activities with noticeable partial left visual field cut impairment.  Educated pt on the importance of compensation due to left visual cut especially scanning all visual field with ambulation.  Continue to recommend OPPT for further balance assessment and compensations for overall safety. PT Plan: Discharge plan remains appropriate;Frequency remains appropriate PT Frequency: Min 4X/week Follow Up Recommendations: Outpatient PT Equipment Recommended: None recommended by PT PT Goals  Acute Rehab PT Goals PT Goal Formulation: With patient Time For Goal Achievement: 7 days Pt will go Supine/Side to Sit: with modified independence PT Goal: Supine/Side to Sit - Progress: Progressing toward goal Pt will go Sit to Supine/Side: with modified independence PT Goal: Sit to Supine/Side - Progress: Progressing toward goal Pt will go Sit to Stand: with modified independence PT Goal: Sit to Stand - Progress: Progressing toward goal Pt will go Stand to Sit: with modified independence PT Goal: Stand to Sit - Progress: Progressing toward goal Pt will Ambulate: with supervision PT Goal: Ambulate - Progress: Progressing toward goal Pt will Go Up / Down Stairs: with supervision PT Goal: Up/Down Stairs - Progress: Progressing toward goal  PT Treatment Precautions/Restrictions  Precautions Precautions: Fall Restrictions Weight Bearing Restrictions: No Mobility (including Balance) Bed Mobility Bed Mobility: Yes Supine to Sit: 5: Supervision Supine to Sit Details (indicate cue type and reason): Supervision for safety with cues for technique Transfers Transfers: Yes Sit to Stand: From bed;With upper extremity  assist (minguard ) Stand to Sit: To chair/3-in-1 (minguard) Ambulation/Gait Ambulation/Gait: Yes Ambulation/Gait Assistance: Other (comment) (minguard) Ambulation/Gait Assistance Details (indicate cue type and reason): Minguard for safety.  Pt continues to ambulate towards left side running into objects on the left side. Ambulation Distance (Feet): 200 Feet Assistive device: None Gait Pattern: Within Functional Limits Gait velocity: decreased due to visual deficits Stairs: No  Posture/Postural Control Posture/Postural Control: No significant limitations Balance Balance Assessed: Yes Static Sitting Balance Static Sitting - Balance Support: No upper extremity supported;Feet supported Static Sitting - Level of Assistance: 7: Independent Exercise    End of Session PT - End of Session Equipment Utilized During Treatment: Gait belt Activity Tolerance: Patient tolerated treatment well Patient left: in chair;with call bell in reach;with family/visitor present Nurse Communication: Mobility status for transfers;Mobility status for ambulation General Behavior During Session: [facility] for tasks performed Cognition: [facility] for tasks performed  Sian Joles 12/03/2011, 9:20 AM Pager:  [phone]

## 2011-12-03 NOTE — Progress Notes (Signed)
Stroke Team Progress Note  SUBJECTIVE  Jonathan [name] is an 71 y.o. male white presenting in the ER with visual changes. The patient tells me that Friday which was about 3 days ago he began to have "floaters' in his eyes. These floaters were present in both eyes and lasted about 30-40 minutes at a time. The patient had these floaters on and off until now and continues to have them. Around Friday night/early Saturday morning the patient developed a headache which he describes as achy and pressure-like. The headache was located on the right side of his head and behind his right eye. Coughing made the headache worse. Using his hand to push against the side of his head and against his right eye relieves some of the pain. The patient did not have any nausea or vomiting with the headache. He did not have any dizziness/vertigo/lightheadedness.patient did not have any changes in speech nor did he have any numbness or tingling anywhere. The patient also did not have any weakness. He did have some light sensitivity but he says that that is long-standing. The patient did not have any changes in his headache with smell, movement or noise. Around the patient noticed some clumsiness with his left hand. He was unable to reach for things that he wanted to grab.   he remains stable today without any new problems. He had TEE today which shows no cardiac source of embolism. OBJECTIVE Most recent Vital Signs: Temp: 98 F (36.7 C) (03/06 1006) Temp src: Oral (03/06 1006) BP: 123/73 mmHg (03/06 1138) Pulse Rate: 69  (03/06 0600) Respiratory Rate: 20 O2 Saturdation: 92%  CBG (last 3)   Basename 12/03/11 1117 12/03/11 0657 12/02/11 2143  GLUCAP 311* 234* 301*    Diet: Carb Control    Activity: Up with assistance   VTE Prophylaxis: lovenox  Studies: Results for orders placed during the hospital encounter of 12/01/11 (from the past 24 hour(s))  GLUCOSE, CAPILLARY     Status: Abnormal   Collection Time   12/02/11   4:51 PM      Component Value Range   Glucose-Capillary 277 (*) 70 - 99 (mg/dL)   Comment 1 Notify RN    GLUCOSE, CAPILLARY     Status: Abnormal   Collection Time   12/02/11  9:43 PM      Component Value Range   Glucose-Capillary 301 (*) 70 - 99 (mg/dL)  GLUCOSE, CAPILLARY     Status: Abnormal   Collection Time   12/03/11  6:57 AM      Component Value Range   Glucose-Capillary 234 (*) 70 - 99 (mg/dL)   Comment 1 Notify RN    GLUCOSE, CAPILLARY     Status: Abnormal   Collection Time   12/03/11 11:17 AM      Component Value Range   Glucose-Capillary 311 (*) 70 - 99 (mg/dL)     Dg Chest 2 View  4/0/9811  *RADIOLOGY REPORT*  Clinical Data: Repeat study with nipple markers.  CHEST - 2 VIEW  Comparison: 12/01/2011  Findings: The study was repeated with nipple markers.  No nodule is seen within the lungs on today's study.  Therefore, this most likely reflected the nipple shadow.  No suspicious areas currently. Heart is normal size.  IMPRESSION: No evidence of pulmonary nodule.  Original Report Authenticated By: Cyndie Chime, M.D.   Dg Chest 2 View  12/02/2011  *RADIOLOGY REPORT*  Clinical Data: Stroke, blurred vision, COPD, hypertension, diabetes, asthma, smoking history  CHEST -  2 VIEW  Comparison: 05/03/2010  Findings: Upper-normal size of cardiac silhouette. Mediastinal contours and pulmonary vascularity normal. Emphysematous and minimal chronic bronchitic changes. No acute infiltrate, pleural effusion or pneumothorax. No acute osseous findings. Question left nipple shadow.  IMPRESSION: Emphysematous and minimal chronic bronchitic changes. Question left nipple shadows; recommend repeat PA chest radiograph with nipple markers to exclude pulmonary nodule.  Original Report Authenticated By: Lollie Marrow, M.D.   [name] Head Wo Contrast  12/01/2011  *RADIOLOGY REPORT*  Clinical Data:  71 year old male with headache, blurred vision.  Comparison: None.  MRI HEAD WITHOUT CONTRAST  Technique: Multiplanar,  multiecho pulse sequences of the brain and surrounding structures were obtained according to standard protocol without intravenous contrast.  Findings: Scattered small cortical and subcortical white matter infarcts with restricted diffusion in the right hemisphere are wide spread affecting all of the posterior right temporal, occipital, parietal, and superior right frontal lobes.  The individual lesions are small and without hemorrhage or significant mass effect. Subtle associated T2 and FLAIR hyperintensity is associated. No posterior fossa or left hemisphere diffusion abnormality.  Major intracranial vascular flow voids are preserved.  Superimposed on a acute findings are widespread foci of cerebral white matter T2 and FLAIR hyperintensity.  Partially empty sella.  No ventriculomegaly. No acute intracranial hemorrhage identified.  No extra-axial collection.  Negative cervicomedullary junction and visualized cervical spine.  Normal bone marrow signal.  Paranasal sinus and right mastoid inflammatory changes.  Negative visualized nasopharynx. Visualized orbit soft tissues are within normal limits.  Negative scalp soft tissues.  IMPRESSION: 1.  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 2.  No associated mass effect or hemorrhage.  Chronic underlying nonspecific cerebral white matter signal changes.  MRA HEAD WITHOUT CONTRAST  Technique: Angiographic images of the Circle of Willis were obtained using MRA technique without  intravenous contrast.  Findings: Dominant distal right vertebral artery primarily supplies the basilar.  Poor visualization of the diminutive distal left vertebral artery.  Normal right PICA.  Dominant left AICA.  No basilar stenosis.  Superior cerebellar arteries are within normal limits.  Both PCA have fetal  origins, that on the right explaining the infarct pattern on the basis of right carotid disease.  No proximal PCA stenosis.  Visualized bilateral PCA branches are within normal limits.  Antegrade flow in both ICA siphons which are irregular.  No significant left ICA stenosis.  There is a possible filling defect in the proximal cavernous right ICA (series 6 image 75). I favor this is artifact.  The supraclinoid right ICA is patent.  No other hemodynamically significant ICA stenosis occurs. Both ophthalmic and posterior communicating artery origins are within normal limits.  The carotid termini are patent.  The left ACA A1 segment is dominant.  Normal anterior communicating artery.  Median artery of the corpus callosum is present.  Visualized ACA branches are within normal limits.  Left MCA M1 segment is irregular but without significant stenosis.  Visualized left MCA branches are within normal limits.  The right MCA M1 segment is within normal limits.  The right MCA bifurcation is patent.  No major right MCA branch occlusion or high- grade stenosis is identified.  MRA source images do suggest decreased flow in small posterior sylvian division branches (series 6 image 118).  IMPRESSION: 1.  Fetal PCAs, congruent with the MRI  findings suggestive of right carotid disease. 2.  Bilateral ICA atherosclerosis.  Possible filling defect or severe plaque in the right cavernous ICA segment which if genuine causes severe focal stenosis. 3.  Otherwise no proximal anterior circulation stenosis or major branch occlusion.  Decreased flow signal suspected in the right MCA M3 posterior divisions.  Original Report Authenticated By: Harley Hallmark, M.D.   Mr Brain Wo Contrast  12/01/2011  *RADIOLOGY REPORT*  Clinical Data:  71 year old male with headache, blurred vision.  Comparison: None.  MRI HEAD WITHOUT CONTRAST  Technique: Multiplanar, multiecho pulse sequences of the brain and surrounding structures were obtained according to  standard protocol without intravenous contrast.  Findings: Scattered small cortical and subcortical white matter infarcts with restricted diffusion in the right hemisphere are wide spread affecting all of the posterior right temporal, occipital, parietal, and superior right frontal lobes.  The individual lesions are small and without hemorrhage or significant mass effect. Subtle associated T2 and FLAIR hyperintensity is associated. No posterior fossa or left hemisphere diffusion abnormality.  Major intracranial vascular flow voids are preserved.  Superimposed on a acute findings are widespread foci of cerebral white matter T2 and FLAIR hyperintensity.  Partially empty sella.  No ventriculomegaly. No acute intracranial hemorrhage identified.  No extra-axial collection.  Negative cervicomedullary junction and visualized cervical spine.  Normal bone marrow signal.  Paranasal sinus and right mastoid inflammatory changes.  Negative visualized nasopharynx. Visualized orbit soft tissues are within normal limits.  Negative scalp soft tissues.  IMPRESSION: 1.  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 2.  No associated mass effect or hemorrhage.  Chronic underlying nonspecific cerebral white matter signal changes.  MRA HEAD WITHOUT CONTRAST  Technique: Angiographic images of the Circle of Willis were obtained using MRA technique without  intravenous contrast.  Findings: Dominant distal right vertebral artery primarily supplies the basilar.  Poor visualization of the diminutive distal left vertebral artery.  Normal right PICA.  Dominant left AICA.  No basilar stenosis.  Superior cerebellar arteries are within normal limits.  Both PCA have fetal origins, that on the right explaining the infarct pattern on the basis of right carotid disease.  No  proximal PCA stenosis.  Visualized bilateral PCA branches are within normal limits.  Antegrade flow in both ICA siphons which are irregular.  No significant left ICA stenosis.  There is a possible filling defect in the proximal cavernous right ICA (series 6 image 75). I favor this is artifact.  The supraclinoid right ICA is patent.  No other hemodynamically significant ICA stenosis occurs. Both ophthalmic and posterior communicating artery origins are within normal limits.  The carotid termini are patent.  The left ACA A1 segment is dominant.  Normal anterior communicating artery.  Median artery of the corpus callosum is present.  Visualized ACA branches are within normal limits.  Left MCA M1 segment is irregular but without significant stenosis.  Visualized left MCA branches are within normal limits.  The right MCA M1 segment is within normal limits.  The right MCA bifurcation is patent.  No major right MCA branch occlusion or high- grade stenosis is identified.  MRA source images do suggest decreased flow in small posterior sylvian division branches (series 6 image 118).  IMPRESSION: 1.  Fetal PCAs, congruent with the MRI findings suggestive of right carotid disease. 2.  Bilateral  ICA atherosclerosis.  Possible filling defect or severe plaque in the right cavernous ICA segment which if genuine causes severe focal stenosis. 3.  Otherwise no proximal anterior circulation stenosis or major branch occlusion.  Decreased flow signal suspected in the right MCA M3 posterior divisions.  Original Report Authenticated By: Harley Hallmark, M.D.    Physical Exam:     Neurologic Exam:  Mental Status:  Alert, oriented, thought content appropriate. Speech fluent without evidence of aphasia. Able to follow 3 step commands without difficulty.  Cranial Nerves:  II- Visual fields grossly intact.  III/IV/VI-Extraocular movements intact. Pupils reactive bilaterally.  V/VII-Smile symmetric  VIII-hearing grossly intact    IX/X-normal gag  XI-bilateral shoulder shrug  XII-midline tongue extension  Motor: 5/5 bilaterally with normal tone and bulk  Sensory: Light touch intact throughout, bilaterally  Deep Tendon Reflexes: 2+ and symmetric throughout  Plantars: Downgoing bilaterally  Cerebellar: Normal finger-to-nose, normal   ASSESSMENT Mr. Jonathan [name] is a 71 y.o. 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 Most consistent with embolic etiology. MRA reveals possible filling defect or severe plaque in the right cavernous ICA segment   TREATMENT/PLAN Plan ; check carotid and transcranial Doppler studies. Continue present medical management.  Hospital day # 2    Gates Rigg, MD [facility] Pager: [phone] 12/03/2011 2:53 PM

## 2011-12-03 NOTE — H&P (View-Only) (Signed)
Subjective: Patient seen and examined this am. Still has some floaters in his eyes and some difficulty reaching out to objects with his left hand as they appear closer than they are.  Denies any weakness  Objective:  Vital signs in last 24 hours:  Filed Vitals:   12/02/11 0830 12/02/11 0858 12/02/11 1024 12/02/11 1115  BP: 122/78  162/95 151/92  Pulse: 67  77   Temp: 97.8 F (36.6 C)  97.9 F (36.6 C)   TempSrc: Oral  Oral   Resp: 17  18   Height:      Weight:      SpO2: 92% 92% 95%     Intake/Output from previous day:   Intake/Output Summary (Last 24 hours) at 12/02/11 1306 Last data filed at 12/02/11 0300  Gross per 24 hour  Intake      0 ml  Output    300 ml  Net   -300 ml    Physical Exam:  General: elderly male  in no acute distress. HEENT: no pallor, no icterus, moist oral mucosa, no JVD, no lymphadenopathy Heart: Normal  s1 &s2  Regular rate and rhythm, without murmurs, rubs, gallops. Lungs: Clear to auscultation bilaterally. Abdomen: Soft, nontender, nondistended, positive bowel sounds. Extremities: No clubbing cyanosis or edema with positive pedal pulses. Neuro: Alert, awake, oriented x3, cranial nerves II-XII intact, normal motor tone power and reflexes, normal sensory fn.   Lab Results:  Basic Metabolic Panel:    Component Value Date/Time   NA 137 12/01/2011 1602   K 4.3 12/01/2011 1602   CL 103 12/01/2011 1602   CO2 29 01/21/2011 0905   BUN 16 12/01/2011 1602   CREATININE 0.80 12/01/2011 1602   GLUCOSE 269* 12/01/2011 1602   CALCIUM 9.6 01/21/2011 0905   CBC:    Component Value Date/Time   WBC 9.1 12/01/2011 1458   HGB 18.7* 12/01/2011 1602   HCT 55.0* 12/01/2011 1602   PLT 182 12/01/2011 1458   MCV 86.0 12/01/2011 1458   NEUTROABS 6.0 12/01/2011 1458   LYMPHSABS 2.1 12/01/2011 1458   MONOABS 0.7 12/01/2011 1458   EOSABS 0.2 12/01/2011 1458   BASOSABS 0.0 12/01/2011 1458    No results found for this or any previous visit (from the past 240  hour(s)).  Studies/Results: Dg Chest 2 View  12/02/2011  *RADIOLOGY REPORT*  Clinical Data: Stroke, blurred vision, COPD, hypertension, diabetes, asthma, smoking history  CHEST - 2 VIEW  Comparison: 05/03/2010  Findings: Upper-normal size of cardiac silhouette. Mediastinal contours and pulmonary vascularity normal. Emphysematous and minimal chronic bronchitic changes. No acute infiltrate, pleural effusion or pneumothorax. No acute osseous findings. Question left nipple shadow.  IMPRESSION: Emphysematous and minimal chronic bronchitic changes. Question left nipple shadows; recommend repeat PA chest radiograph with nipple markers to exclude pulmonary nodule.  Original Report Authenticated By: Lollie Marrow, M.D.   [name] Head Wo Contrast  12/01/2011  *RADIOLOGY REPORT*  Clinical Data:  71 year old male with headache, blurred vision.  Comparison: None.  MRI HEAD WITHOUT CONTRAST  Technique: Multiplanar, multiecho pulse sequences of the brain and surrounding structures were obtained according to standard protocol without intravenous contrast.  Findings: Scattered small cortical and subcortical white matter infarcts with restricted diffusion in the right hemisphere are wide spread affecting all of the posterior right temporal, occipital, parietal, and superior right frontal lobes.  The individual lesions are small and without hemorrhage or significant mass effect. Subtle associated T2 and FLAIR hyperintensity is associated. No posterior fossa or left  hemisphere diffusion abnormality.  Major intracranial vascular flow voids are preserved.  Superimposed on a acute findings are widespread foci of cerebral white matter T2 and FLAIR hyperintensity.  Partially empty sella.  No ventriculomegaly. No acute intracranial hemorrhage identified.  No extra-axial collection.  Negative cervicomedullary junction and visualized cervical spine.  Normal bone marrow signal.  Paranasal sinus and right mastoid inflammatory changes.   Negative visualized nasopharynx. Visualized orbit soft tissues are within normal limits.  Negative scalp soft tissues.  IMPRESSION: 1.  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 2.  No associated mass effect or hemorrhage.  Chronic underlying nonspecific cerebral white matter signal changes.  MRA HEAD WITHOUT CONTRAST  Technique: Angiographic images of the Circle of Willis were obtained using MRA technique without  intravenous contrast.  Findings: Dominant distal right vertebral artery primarily supplies the basilar.  Poor visualization of the diminutive distal left vertebral artery.  Normal right PICA.  Dominant left AICA.  No basilar stenosis.  Superior cerebellar arteries are within normal limits.  Both PCA have fetal origins, that on the right explaining the infarct pattern on the basis of right carotid disease.  No proximal PCA stenosis.  Visualized bilateral PCA branches are within normal limits.  Antegrade flow in both ICA siphons which are irregular.  No significant left ICA stenosis.  There is a possible filling defect in the proximal cavernous right ICA (series 6 image 75). I favor this is artifact.  The supraclinoid right ICA is patent.  No other hemodynamically significant ICA stenosis occurs. Both ophthalmic and posterior communicating artery origins are within normal limits.  The carotid termini are patent.  The left ACA A1 segment is dominant.  Normal anterior communicating artery.  Median artery of the corpus callosum is present.  Visualized ACA branches are within normal limits.  Left MCA M1 segment is irregular but without significant stenosis.  Visualized left MCA branches are within normal limits.  The right MCA M1 segment is within normal limits.  The right MCA bifurcation is patent.  No major right MCA branch  occlusion or high- grade stenosis is identified.  MRA source images do suggest decreased flow in small posterior sylvian division branches (series 6 image 118).  IMPRESSION: 1.  Fetal PCAs, congruent with the MRI findings suggestive of right carotid disease. 2.  Bilateral ICA atherosclerosis.  Possible filling defect or severe plaque in the right cavernous ICA segment which if genuine causes severe focal stenosis. 3.  Otherwise no proximal anterior circulation stenosis or major branch occlusion.  Decreased flow signal suspected in the right MCA M3 posterior divisions.  Original Report Authenticated By: Harley Hallmark, M.D.   Mr Brain Wo Contrast  12/01/2011  *RADIOLOGY REPORT*  Clinical Data:  71 year old male with headache, blurred vision.  Comparison: None.  MRI HEAD WITHOUT CONTRAST  Technique: Multiplanar, multiecho pulse sequences of the brain and surrounding structures were obtained according to standard protocol without intravenous contrast.  Findings: Scattered small cortical and subcortical white matter infarcts with restricted diffusion in the right hemisphere are wide spread affecting all of the posterior right temporal, occipital, parietal, and superior right frontal lobes.  The individual lesions are small and without hemorrhage or significant mass effect. Subtle associated T2 and FLAIR hyperintensity is associated. No posterior fossa or left hemisphere diffusion abnormality.  Major intracranial vascular flow voids  are preserved.  Superimposed on a acute findings are widespread foci of cerebral white matter T2 and FLAIR hyperintensity.  Partially empty sella.  No ventriculomegaly. No acute intracranial hemorrhage identified.  No extra-axial collection.  Negative cervicomedullary junction and visualized cervical spine.  Normal bone marrow signal.  Paranasal sinus and right mastoid inflammatory changes.  Negative visualized nasopharynx. Visualized orbit soft tissues are within normal limits.  Negative  scalp soft tissues.  IMPRESSION: 1.  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 2.  No associated mass effect or hemorrhage.  Chronic underlying nonspecific cerebral white matter signal changes.  MRA HEAD WITHOUT CONTRAST  Technique: Angiographic images of the Circle of Willis were obtained using MRA technique without  intravenous contrast.  Findings: Dominant distal right vertebral artery primarily supplies the basilar.  Poor visualization of the diminutive distal left vertebral artery.  Normal right PICA.  Dominant left AICA.  No basilar stenosis.  Superior cerebellar arteries are within normal limits.  Both PCA have fetal origins, that on the right explaining the infarct pattern on the basis of right carotid disease.  No proximal PCA stenosis.  Visualized bilateral PCA branches are within normal limits.  Antegrade flow in both ICA siphons which are irregular.  No significant left ICA stenosis.  There is a possible filling defect in the proximal cavernous right ICA (series 6 image 75). I favor this is artifact.  The supraclinoid right ICA is patent.  No other hemodynamically significant ICA stenosis occurs. Both ophthalmic and posterior communicating artery origins are within normal limits.  The carotid termini are patent.  The left ACA A1 segment is dominant.  Normal anterior communicating artery.  Median artery of the corpus callosum is present.  Visualized ACA branches are within normal limits.  Left MCA M1 segment is irregular but without significant stenosis.  Visualized left MCA branches are within normal limits.  The right MCA M1 segment is within normal limits.  The right MCA bifurcation is patent.  No major right MCA branch occlusion or high- grade stenosis is identified.  MRA source images do suggest decreased flow in  small posterior sylvian division branches (series 6 image 118).  IMPRESSION: 1.  Fetal PCAs, congruent with the MRI findings suggestive of right carotid disease. 2.  Bilateral ICA atherosclerosis.  Possible filling defect or severe plaque in the right cavernous ICA segment which if genuine causes severe focal stenosis. 3.  Otherwise no proximal anterior circulation stenosis or major branch occlusion.  Decreased flow signal suspected in the right MCA M3 posterior divisions.  Original Report Authenticated By: Harley Hallmark, M.D.    Medications: Scheduled Meds:   . aspirin  300 mg Rectal Daily   Or  . aspirin  325 mg Oral Daily  . atorvastatin  10 mg Oral Daily  . enoxaparin  40 mg Subcutaneous Q24H  . fluticasone  1 spray Each Nare Daily  . Fluticasone-Salmeterol  1 puff Inhalation Q12H  . glipiZIDE  10 mg Oral Q breakfast  . HYDROcodone-acetaminophen  2 tablet Oral Once  . insulin aspart  0-9 Units Subcutaneous TID WC  . insulin aspart  5 Units Subcutaneous Once   Continuous Infusions:   . sodium chloride 50 mL/hr at 12/01/11 2105   PRN Meds:.acetaminophen, acetaminophen, albuterol, ondansetron (ZOFRAN) IV, senna-docusate  Assessment 71 y/o male with hx of tobacco  abuse, COPD, HTN, DM , HL admitted with floaters in the eyes for past  3 days with findings of numerous rt MCA and PCA territory infarcts.    Plan:  *CVA (cerebral infarction) Patient has visual symptoms in the form of floaters only  cont monitor in telemetry  neuro checks  MRI /MRA results as above 2D echo, carotid dopplers pending TEE as per neuro recs. 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 Will follow   DVT prophylaxis  Cardiac/ diabetic diet  Full code  Plan discussed with patient and his wife.   Consult: Dr Pearlean Brownie ( neurology)  Tobacco abuse    LOS: 1 day   Jonathan [name] 12/02/2011, 1:06 PM

## 2011-12-03 NOTE — Progress Notes (Signed)
Subjective: Patient seen and examined this morning ,he has occasional floaters but denies any other complaints,wife reported difficulty with reaching things and coardination  Objective: Vital signs in last 24 hours: Temp:  [97.7 F (36.5 C)-98.3 F (36.8 C)] 98 F (36.7 C) (03/06 0600) Pulse Rate:  [67-77] 69  (03/06 0600) Resp:  [17-19] 18  (03/06 0600) BP: (111-162)/(72-95) 145/88 mmHg (03/06 0600) SpO2:  [92 %-97 %] 93 % (03/06 0600) Weight change:  Last BM Date: 11/30/11 (prior to admision)  Intake/Output from previous day: 03/05 0701 - 03/06 0700 In: 240 [P.O.:240] Out: 250 [Urine:250]     Physical Exam: General: Alert, awake, oriented x3, in no acute distress. Heart: Regular rate and rhythm, without murmurs, rubs, gallops. Lungs: Clear to auscultation bilaterally. Abdomen: Soft, nontender, nondistended, positive bowel sounds. Extremities: No clubbing cyanosis or edema with positive pedal pulses. Neuro: Grossly intact, nonfocal.    Lab Results: Results for orders placed during the hospital encounter of 12/01/11 (from the past 24 hour(s))  GLUCOSE, CAPILLARY     Status: Abnormal   Collection Time   12/02/11  1:18 PM      Component Value Range   Glucose-Capillary 333 (*) 70 - 99 (mg/dL)  GLUCOSE, CAPILLARY     Status: Abnormal   Collection Time   12/02/11  4:51 PM      Component Value Range   Glucose-Capillary 277 (*) 70 - 99 (mg/dL)   Comment 1 Notify RN    GLUCOSE, CAPILLARY     Status: Abnormal   Collection Time   12/02/11  9:43 PM      Component Value Range   Glucose-Capillary 301 (*) 70 - 99 (mg/dL)  GLUCOSE, CAPILLARY     Status: Abnormal   Collection Time   12/03/11  6:57 AM      Component Value Range   Glucose-Capillary 234 (*) 70 - 99 (mg/dL)   Comment 1 Notify RN      Studies/Results: Dg Chest 2 View  12/02/2011  *RADIOLOGY REPORT*  Clinical Data: Repeat study with nipple markers.  CHEST - 2 VIEW  Comparison: 12/01/2011  Findings: The study was  repeated with nipple markers.  No nodule is seen within the lungs on today's study.  Therefore, this most likely reflected the nipple shadow.  No suspicious areas currently. Heart is normal size.  IMPRESSION: No evidence of pulmonary nodule.  Original Report Authenticated By: Cyndie Chime, M.D.   Dg Chest 2 View  12/02/2011  *RADIOLOGY REPORT*  Clinical Data: Stroke, blurred vision, COPD, hypertension, diabetes, asthma, smoking history  CHEST - 2 VIEW  Comparison: 05/03/2010  Findings: Upper-normal size of cardiac silhouette. Mediastinal contours and pulmonary vascularity normal. Emphysematous and minimal chronic bronchitic changes. No acute infiltrate, pleural effusion or pneumothorax. No acute osseous findings. Question left nipple shadow.  IMPRESSION: Emphysematous and minimal chronic bronchitic changes. Question left nipple shadows; recommend repeat PA chest radiograph with nipple markers to exclude pulmonary nodule.  Original Report Authenticated By: Lollie Marrow, M.D.   [name] Head Wo Contrast  12/01/2011  *RADIOLOGY REPORT*  Clinical Data:  71 year old male with headache, blurred vision.  Comparison: None.  MRI HEAD WITHOUT CONTRAST  Technique: Multiplanar, multiecho pulse sequences of the brain and surrounding structures were obtained according to standard protocol without intravenous contrast.  Findings: Scattered small cortical and subcortical white matter infarcts with restricted diffusion in the right hemisphere are wide spread affecting all of the posterior right temporal, occipital, parietal, and superior right frontal lobes.  The individual lesions are small and without hemorrhage or significant mass effect. Subtle associated T2 and FLAIR hyperintensity is associated. No posterior fossa or left hemisphere diffusion abnormality.  Major intracranial vascular flow voids are preserved.  Superimposed on a acute findings are widespread foci of cerebral white matter T2 and FLAIR hyperintensity.   Partially empty sella.  No ventriculomegaly. No acute intracranial hemorrhage identified.  No extra-axial collection.  Negative cervicomedullary junction and visualized cervical spine.  Normal bone marrow signal.  Paranasal sinus and right mastoid inflammatory changes.  Negative visualized nasopharynx. Visualized orbit soft tissues are within normal limits.  Negative scalp soft tissues.  IMPRESSION: 1.  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 2.  No associated mass effect or hemorrhage.  Chronic underlying nonspecific cerebral white matter signal changes.  MRA HEAD WITHOUT CONTRAST  Technique: Angiographic images of the Circle of Willis were obtained using MRA technique without  intravenous contrast.  Findings: Dominant distal right vertebral artery primarily supplies the basilar.  Poor visualization of the diminutive distal left vertebral artery.  Normal right PICA.  Dominant left AICA.  No basilar stenosis.  Superior cerebellar arteries are within normal limits.  Both PCA have fetal origins, that on the right explaining the infarct pattern on the basis of right carotid disease.  No proximal PCA stenosis.  Visualized bilateral PCA branches are within normal limits.  Antegrade flow in both ICA siphons which are irregular.  No significant left ICA stenosis.  There is a possible filling defect in the proximal cavernous right ICA (series 6 image 75). I favor this is artifact.  The supraclinoid right ICA is patent.  No other hemodynamically significant ICA stenosis occurs. Both ophthalmic and posterior communicating artery origins are within normal limits.  The carotid termini are patent.  The left ACA A1 segment is dominant.  Normal anterior communicating artery.  Median artery of the corpus callosum is present.  Visualized ACA  branches are within normal limits.  Left MCA M1 segment is irregular but without significant stenosis.  Visualized left MCA branches are within normal limits.  The right MCA M1 segment is within normal limits.  The right MCA bifurcation is patent.  No major right MCA branch occlusion or high- grade stenosis is identified.  MRA source images do suggest decreased flow in small posterior sylvian division branches (series 6 image 118).  IMPRESSION: 1.  Fetal PCAs, congruent with the MRI findings suggestive of right carotid disease. 2.  Bilateral ICA atherosclerosis.  Possible filling defect or severe plaque in the right cavernous ICA segment which if genuine causes severe focal stenosis. 3.  Otherwise no proximal anterior circulation stenosis or major branch occlusion.  Decreased flow signal suspected in the right MCA M3 posterior divisions.  Original Report Authenticated By: Harley Hallmark, M.D.   Mr Brain Wo Contrast  12/01/2011  *RADIOLOGY REPORT*  Clinical Data:  71 year old male with headache, blurred vision.  Comparison: None.  MRI HEAD WITHOUT CONTRAST  Technique: Multiplanar, multiecho pulse sequences of the brain and surrounding structures were obtained according to standard protocol without intravenous contrast.  Findings: Scattered small cortical and subcortical white matter infarcts with restricted diffusion in the right hemisphere are wide spread affecting all of the posterior right temporal, occipital, parietal, and superior right frontal lobes.  The individual lesions are small and without hemorrhage or  significant mass effect. Subtle associated T2 and FLAIR hyperintensity is associated. No posterior fossa or left hemisphere diffusion abnormality.  Major intracranial vascular flow voids are preserved.  Superimposed on a acute findings are widespread foci of cerebral white matter T2 and FLAIR hyperintensity.  Partially empty sella.  No ventriculomegaly. No acute intracranial hemorrhage identified.  No  extra-axial collection.  Negative cervicomedullary junction and visualized cervical spine.  Normal bone marrow signal.  Paranasal sinus and right mastoid inflammatory changes.  Negative visualized nasopharynx. Visualized orbit soft tissues are within normal limits.  Negative scalp soft tissues.  IMPRESSION: 1.  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 2.  No associated mass effect or hemorrhage.  Chronic underlying nonspecific cerebral white matter signal changes.  MRA HEAD WITHOUT CONTRAST  Technique: Angiographic images of the Circle of Willis were obtained using MRA technique without  intravenous contrast.  Findings: Dominant distal right vertebral artery primarily supplies the basilar.  Poor visualization of the diminutive distal left vertebral artery.  Normal right PICA.  Dominant left AICA.  No basilar stenosis.  Superior cerebellar arteries are within normal limits.  Both PCA have fetal origins, that on the right explaining the infarct pattern on the basis of right carotid disease.  No proximal PCA stenosis.  Visualized bilateral PCA branches are within normal limits.  Antegrade flow in both ICA siphons which are irregular.  No significant left ICA stenosis.  There is a possible filling defect in the proximal cavernous right ICA (series 6 image 75). I favor this is artifact.  The supraclinoid right ICA is patent.  No other hemodynamically significant ICA stenosis occurs. Both ophthalmic and posterior communicating artery origins are within normal limits.  The carotid termini are patent.  The left ACA A1 segment is dominant.  Normal anterior communicating artery.  Median artery of the corpus callosum is present.  Visualized ACA branches are within normal limits.  Left MCA M1 segment is irregular but without significant  stenosis.  Visualized left MCA branches are within normal limits.  The right MCA M1 segment is within normal limits.  The right MCA bifurcation is patent.  No major right MCA branch occlusion or high- grade stenosis is identified.  MRA source images do suggest decreased flow in small posterior sylvian division branches (series 6 image 118).  IMPRESSION: 1.  Fetal PCAs, congruent with the MRI findings suggestive of right carotid disease. 2.  Bilateral ICA atherosclerosis.  Possible filling defect or severe plaque in the right cavernous ICA segment which if genuine causes severe focal stenosis. 3.  Otherwise no proximal anterior circulation stenosis or major branch occlusion.  Decreased flow signal suspected in the right MCA M3 posterior divisions.  Original Report Authenticated By: Harley Hallmark, M.D.    Medications:    . aspirin  300 mg Rectal Daily   Or  . aspirin  325 mg Oral Daily  . atorvastatin  10 mg Oral Daily  . diphenhydrAMINE  12.5 mg Intravenous Once  . enoxaparin  40 mg Subcutaneous Q24H  . fluticasone  1 spray Each Nare Daily  . Fluticasone-Salmeterol  1 puff Inhalation Q12H  . glipiZIDE  10 mg Oral Q breakfast  . insulin aspart  0-9 Units Subcutaneous TID WC    acetaminophen, acetaminophen, albuterol, HYDROcodone-acetaminophen, ondansetron (ZOFRAN) IV, senna-docusate     . sodium chloride  50 mL/hr at 12/02/11 2259    Assessment/Plan:   *CVA (cerebral infarction)  MRI /MRA results as above  2D echo showed EF 55-60%, carotid dopplers showed no sig stenosis .TCD done report pending . 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 Will follow  Disposition: to home with HHPT/OT      LOS: 2 days   Daniqua Campoy 12/03/2011, 7:42 AM

## 2011-12-03 NOTE — Procedures (Signed)
See report in [facility]; no source of embolus identified. Olga Millers

## 2011-12-03 NOTE — Progress Notes (Signed)
Occupational Therapy Treatment Patient Details Name: Jonathan [name] MRN: [id number] DOB: 08-12-1941 Today's Date: 12/03/2011  OT Assessment/Plan OT Assessment/Plan Comments on Treatment Session: Pt demonstrated an increased awareness of Lt visual deficits after handout . Pt remains fall risk and will need continued therapy for visual changes. pt could benefit from follow up with opthamologist OT Plan: Discharge plan remains appropriate OT Frequency: Min 2X/week Follow Up Recommendations: Outpatient OT Equipment Recommended: None recommended by OT OT Goals Acute Rehab OT Goals OT Goal Formulation: With patient/family Time For Goal Achievement: 2 weeks ADL Goals Pt Will Perform Grooming: with modified independence;Standing at sink ADL Goal: Grooming - Progress: Progressing toward goals Pt Will Transfer to Toilet: with modified independence;Regular height toilet ADL Goal: Toilet Transfer - Progress: Progressing toward goals Pt Will Perform Toileting - Clothing Manipulation: with modified independence;Sitting on 3-in-1 or toilet ADL Goal: Toileting - Clothing Manipulation - Progress: Progressing toward goals Pt Will Perform Toileting - Hygiene: with modified independence;Sit to stand from 3-in-1/toilet ADL Goal: Toileting - Hygiene - Progress: Progressing toward goals Miscellaneous OT Goals Miscellaneous OT Goal #1: Pt will perform bed mobility with HOb <20 degrees no bed rail at MOd I as precursor to ADLS OT Goal: Miscellaneous Goal #1 - Progress: Progressing toward goals Miscellaneous OT Goal #2: Pt will demonstrate compensatory strategy by rotating neck and scanning environment to locate 3 out 4 objects on Lt side without any v/c  OT Goal: Miscellaneous Goal #2 - Progress: Progressing toward goals  OT Treatment Precautions/Restrictions  Precautions Precautions: Fall Restrictions Weight Bearing Restrictions: No   ADL ADL Ambulation Related to ADLs: Pt ambulating with Min Guard  (A) and needed Min (A) to avoid objects on Lt side. Pt provided 3 objects to weave around in a figure 8 pattern Pt walking into objects on Lt side. pt with lack of awareness of deficits with visual changes.  ADL Comments: pt provided a series of visual handouts to challenge and better define visual deficits. Pt reports no visual changes with Lt eye occluded. pt reports 5 "flag" like spots in visual field with Rt eye occluded. pt states "I can make them go anywhere and sometimes they disappear. They are little floaters" Pt provided handout with  a series of straight lines and asked to divide the lines by marking where the "middle" is located. Pt neglected all lines on lt side of paper and demonstrates midline being just right of true midline. Pt states " oh are you kidding me" when provided tactile and visual input to the start and finish of a line on the handout. Pt unaware of errors made. Pt unable to draw a clock, unable to copy a picture of a house and required 10 minutes with extensive assistance to copy a flower. Pt demonstrates Lt visual field cut. Copies of all worksheet handouts are in shadow chart. Mobility  Bed Mobility Bed Mobility: Yes Supine to Sit: 5: Supervision Transfers Transfers: Yes Sit to Stand: From bed;With upper extremity assist (min guard) Stand to Sit: To chair/3-in-1 (min guard) Exercises    End of Session OT - End of Session Equipment Utilized During Treatment: Gait belt Activity Tolerance: Patient tolerated treatment well Patient left: in chair;with call bell in reach Nurse Communication: Mobility status for transfers General Behavior During Session: [facility] for tasks performed Cognition: [facility] for tasks performed Cognitive Impairment: Patient presents with decreased safety awareness.  Harrel Carina [facility]  12/03/2011, 1:26 PM Pager: [phone]

## 2011-12-03 NOTE — Interval H&P Note (Signed)
History and Physical Interval Note:  12/03/2011 10:30 AM  Jonathan [name]  has presented today for surgery, with the diagnosis of stroke  The various methods of treatment have been discussed with the patient and family. After consideration of risks, benefits and other options for treatment, the patient has consented to  Procedure(s) (LRB): TRANSESOPHAGEAL ECHOCARDIOGRAM (TEE) (N/A) as a surgical intervention .  The patients' history has been reviewed, patient examined, no change in status, stable for surgery.  I have reviewed the patients' chart and labs.  Questions were answered to the patient's satisfaction.     Olga Millers

## 2011-12-03 NOTE — Progress Notes (Signed)
VASCULAR LAB PRELIMINARY  PRELIMINARY  PRELIMINARY  PRELIMINARY  Carotid duplex and TCD has been completed.    Preliminary report: No evidence of significant ICA stenosis bilaterally.  Vanna Scotland,  RVT 12/03/2011, 8:41 PM

## 2011-12-03 NOTE — Progress Notes (Signed)
  Echocardiogram Echocardiogram Transesophageal has been performed.  Jonathan [name] 12/03/2011, 11:17 AM

## 2011-12-03 NOTE — Progress Notes (Signed)
   CARE MANAGEMENT NOTE 12/03/2011  Patient:  Jonathan [name], Jonathan [name]   Account Number:  [id number]  Date Initiated:  12/03/2011  Documentation initiated by:  Junius Creamer  Subjective/Objective Assessment:   adm w stroke symptoms     Action/Plan:   lives w wife, pcp dr Idamae [facility] [place]   Anticipated DC Date:  12/04/2011   Anticipated DC Plan:  HOME/SELF CARE      DC Planning Services  CM consult      Choice offered to / List presented to:             Status of service:   Medicare Important Message given?   (If response is "NO", the following Medicare IM given date fields will be blank) Date Medicare IM given:   Date Additional Medicare IM given:    Discharge Disposition:    Per UR Regulation:    Comments:  3/6 spoke w pt and wife. hx of outpt phy ther. pt states he does not want outpt or hh phy ther. i left my card in case pt changes his mind about outpt or hhc. refuses at present. debbie Pola Furno rn,bsn [id number]

## 2011-12-03 NOTE — Progress Notes (Signed)
Inpatient Diabetes Program Recommendations  AACE/ADA: New Consensus Statement on Inpatient Glycemic Control (2009)  Target Ranges:  Prepandial:   less than 140 mg/dL      Peak postprandial:   less than 180 mg/dL (1-2 hours)      Critically ill patients:  140 - 180 mg/dL   Reason for Visit: Sustained hyperglycemia and elevated HgbA1C  Results for Jonathan [name], Jonathan [name] (MRN [id number]) as of 12/03/2011 11:52  Ref. Range 12/01/2011 22:35 12/02/2011 07:01 12/02/2011 13:18 12/02/2011 16:51 12/02/2011 21:43 12/03/2011 06:57 12/03/2011 11:17  Glucose-Capillary Latest Range: 70-99 mg/dL 782 (H) 956 (H) 213 (H) 277 (H) 301 (H) 234 (H) 311 (H)   Inpatient Diabetes Program Recommendations Insulin - Basal: Agree that patient badly needs some basal Lantus 10-15 units daily or HS. (See hbgA1C results below. HgbA1C: High at 11.7%  Note: Thank you, Lenor Coffin, RN, CNS, Diabetes Coordinator [phone])

## 2011-12-04 ENCOUNTER — Encounter (HOSPITAL_COMMUNITY): Payer: Self-pay | Admitting: Cardiology

## 2011-12-04 LAB — GLUCOSE, CAPILLARY: Glucose-Capillary: 253 mg/dL — ABNORMAL HIGH (ref 70–99)

## 2011-12-04 LAB — CBC
HCT: 47.8 % (ref 39.0–52.0)
Hemoglobin: 16.8 g/dL (ref 13.0–17.0)
MCH: 30.5 pg (ref 26.0–34.0)
MCHC: 35.1 g/dL (ref 30.0–36.0)
MCV: 86.8 fL (ref 78.0–100.0)
Platelets: 185 10*3/uL (ref 150–400)
RBC: 5.51 MIL/uL (ref 4.22–5.81)
RDW: 13 % (ref 11.5–15.5)
WBC: 8.2 10*3/uL (ref 4.0–10.5)

## 2011-12-04 LAB — BASIC METABOLIC PANEL
BUN: 11 mg/dL (ref 6–23)
CO2: 25 mEq/L (ref 19–32)
Calcium: 8.5 mg/dL (ref 8.4–10.5)
Chloride: 99 mEq/L (ref 96–112)
Creatinine, Ser: 0.91 mg/dL (ref 0.50–1.35)
GFR calc Af Amer: >90 mL/min (ref >90)
GFR calc non Af Amer: 83 mL/min — ABNORMAL LOW (ref >90)
Glucose, Bld: 236 mg/dL — ABNORMAL HIGH (ref 70–99)
Potassium: 4 mEq/L (ref 3.5–5.1)
Sodium: 136 mEq/L (ref 135–145)

## 2011-12-04 MED ORDER — ASPIRIN 325 MG PO TABS: 325.0000 mg | ORAL_TABLET | Freq: Every day | ORAL | Status: AC

## 2011-12-04 NOTE — Progress Notes (Signed)
Stroke Team Progress Note  SUBJECTIVE  Jonathan [name] is an 71 y.o. male white presenting in the ER with visual changes. The patient tells me that Friday which was about 3 days ago he began to have "floaters' in his eyes. These floaters were present in both eyes and lasted about 30-40 minutes at a time. The patient had these floaters on and off until now and continues to have them. Around Friday night/early Saturday morning the patient developed a headache which he describes as achy and pressure-like. The headache was located on the right side of his head and behind his right eye. Coughing made the headache worse. Using his hand to push against the side of his head and against his right eye relieves some of the pain. The patient did not have any nausea or vomiting with the headache. He did not have any dizziness/vertigo/lightheadedness.patient did not have any changes in speech nor did he have any numbness or tingling anywhere. The patient also did not have any weakness. He did have some light sensitivity but he says that that is long-standing. The patient did not have any changes in his headache with smell, movement or noise. Around the patient noticed some clumsiness with his left hand. He was unable to reach for things that he wanted to grab.   he remains stable today without any new problems. He had carotid dopplers which were unremarkable and TCD results are pending OBJECTIVE Most recent Vital Signs: Temp: 97.7 F (36.5 C) (03/07 0512) Temp src: Oral (03/07 0512) BP: 124/76 mmHg (03/07 0512) Pulse Rate: 62  (03/07 0512) Respiratory Rate: 20 O2 Saturdation: 93%  CBG (last 3)   Basename 12/04/11 0726 12/03/11 2144 12/03/11 1749  GLUCAP 253* 330* 238*    Diet: Carb Control    Activity: Up with assistance   VTE Prophylaxis: lovenox  Studies: Results for orders placed during the hospital encounter of 12/01/11 (from the past 24 hour(s))  GLUCOSE, CAPILLARY     Status: Abnormal   Collection Time   12/03/11 11:17 AM      Component Value Range   Glucose-Capillary 311 (*) 70 - 99 (mg/dL)  GLUCOSE, CAPILLARY     Status: Abnormal   Collection Time   12/03/11  5:49 PM      Component Value Range   Glucose-Capillary 238 (*) 70 - 99 (mg/dL)  GLUCOSE, CAPILLARY     Status: Abnormal   Collection Time   12/03/11  9:44 PM      Component Value Range   Glucose-Capillary 330 (*) 70 - 99 (mg/dL)  CBC     Status: Normal   Collection Time   12/04/11  6:15 AM      Component Value Range   WBC 8.2  4.0 - 10.5 (K/uL)   RBC 5.51  4.22 - 5.81 (MIL/uL)   Hemoglobin 16.8  13.0 - 17.0 (g/dL)   HCT 14.7  82.9 - 56.2 (%)   MCV 86.8  78.0 - 100.0 (fL)   MCH 30.5  26.0 - 34.0 (pg)   MCHC 35.1  30.0 - 36.0 (g/dL)   RDW 13.0  86.5 - 78.4 (%)   Platelets 185  150 - 400 (K/uL)  BASIC METABOLIC PANEL     Status: Abnormal   Collection Time   12/04/11  6:15 AM      Component Value Range   Sodium 136  135 - 145 (mEq/L)   Potassium 4.0  3.5 - 5.1 (mEq/L)   Chloride 99  96 -  112 (mEq/L)   CO2 25  19 - 32 (mEq/L)   Glucose, Bld 236 (*) 70 - 99 (mg/dL)   BUN 11  6 - 23 (mg/dL)   Creatinine, Ser 1.61  0.50 - 1.35 (mg/dL)   Calcium 8.5  8.4 - 09.6 (mg/dL)   GFR calc non Af Amer 83 (*) >90 (mL/min)   GFR calc Af Amer >90  >90 (mL/min)  GLUCOSE, CAPILLARY     Status: Abnormal   Collection Time   12/04/11  7:26 AM      Component Value Range   Glucose-Capillary 253 (*) 70 - 99 (mg/dL)     Dg Chest 2 View  0/12/5407  *RADIOLOGY REPORT*  Clinical Data: Repeat study with nipple markers.  CHEST - 2 VIEW  Comparison: 12/01/2011  Findings: The study was repeated with nipple markers.  No nodule is seen within the lungs on today's study.  Therefore, this most likely reflected the nipple shadow.  No suspicious areas currently. Heart is normal size.  IMPRESSION: No evidence of pulmonary nodule.  Original Report Authenticated By: Cyndie Chime, M.D.    Physical Exam:     Neurologic Exam:  Mental Status:    Alert, oriented, thought content appropriate. Speech fluent without evidence of aphasia. Able to follow 3 step commands without difficulty.  Cranial Nerves:  II- Visual fields grossly intact.  III/IV/VI-Extraocular movements intact. Pupils reactive bilaterally.  V/VII-Smile symmetric  VIII-hearing grossly intact  IX/X-normal gag  XI-bilateral shoulder shrug  XII-midline tongue extension  Motor: 5/5 bilaterally with normal tone and bulk  Sensory: Light touch intact throughout, bilaterally  Deep Tendon Reflexes: 2+ and symmetric throughout  Plantars: Downgoing bilaterally  Cerebellar: Normal finger-to-nose, normal   ASSESSMENT Mr. Jonathan [name] is a 71 y.o. 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 Most consistent with embolic etiology. 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 Continue present medical management.F/U as outpatient.D/w patient and wife. Sign off  Hospital day # 3    Gates Rigg, MD [facility] Pager: [phone] 12/04/2011 10:27 AM

## 2011-12-04 NOTE — Progress Notes (Signed)
Physical Therapy Treatment Patient Details Name: Jonathan [name] MRN: [id number] DOB: 12/21/1940 Today's Date: 12/04/2011  PT Assessment/Plan  PT - Assessment/Plan Comments on Treatment Session: Educated pt and family on the importance of visual exercises and the importance of scanning all visual field for safety and fall prevention.  Pt is aware and highly encouraged to follow up with OPPT and OPOT PT Plan: Discharge plan remains appropriate;Frequency remains appropriate PT Frequency: Min 4X/week Follow Up Recommendations: Outpatient PT Equipment Recommended: None recommended by OT PT Goals  Acute Rehab PT Goals PT Goal Formulation: With patient Time For Goal Achievement: 7 days Pt will go Supine/Side to Sit: with modified independence PT Goal: Supine/Side to Sit - Progress: Progressing toward goal Pt will go Sit to Supine/Side: with modified independence PT Goal: Sit to Supine/Side - Progress: Progressing toward goal Pt will go Sit to Stand: with modified independence PT Goal: Sit to Stand - Progress: Progressing toward goal Pt will go Stand to Sit: with modified independence PT Goal: Stand to Sit - Progress: Progressing toward goal Pt will Ambulate: with supervision PT Goal: Ambulate - Progress: Progressing toward goal  PT Treatment Precautions/Restrictions  Precautions Precautions: Fall Restrictions Weight Bearing Restrictions: No Mobility (including Balance) Bed Mobility Bed Mobility: Yes Supine to Sit: 5: Supervision Supine to Sit Details (indicate cue type and reason): Supervision for safety and extra time needed Transfers Transfers: Yes Sit to Stand: 5: Supervision;From bed;With upper extremity assist Sit to Stand Details (indicate cue type and reason): Supervision for safefty due to balance impairments Stand to Sit: 5: Supervision;To bed Stand to Sit Details: Supervision for safety Ambulation/Gait Ambulation/Gait: Yes Ambulation/Gait Assistance: Other (comment)  (min guard) Ambulation/Gait Assistance Details (indicate cue type and reason): Minguard for safety with max VCs for visual field scanning to left side due to left visual field cut.  Pt continues to ambulate towards left side. Ambulation Distance (Feet): 175 Feet Assistive device: None Gait Pattern:  (guarded gait due to visual deficits) Gait velocity: decreased due to visual deficits Stairs: No  Posture/Postural Control Posture/Postural Control: No significant limitations Exercise    End of Session PT - End of Session Equipment Utilized During Treatment: Gait belt Activity Tolerance: Patient tolerated treatment well Patient left: in bed;with call bell in reach;with family/visitor present (sittine EOB) Nurse Communication: Mobility status for transfers;Mobility status for ambulation General Behavior During Session: [facility] for tasks performed Cognition: [facility] for tasks performed  Aubrey Voong 12/04/2011, 3:18 PM Pager:  [phone]

## 2011-12-04 NOTE — Progress Notes (Signed)
Pt d/c in stable condition. Pt and family given d/c instructions

## 2011-12-04 NOTE — Discharge Summary (Addendum)
Patient ID: Jonathan [name] MRN: [id number] DOB/AGE: 1940/12/10 71 y.o.  Admit date: 12/01/2011 Discharge date: 12/04/2011  Primary Care Physician:  Roxy Manns, MD, MD   Discharge Diagnoses:    Present on Admission:  .CVA (cerebral infarction) .HYPERTENSION .Tobacco abuse .HYPERLIPIDEMIA .DIABETES MELLITUS, TYPE II .COPD  Medication List  As of 12/04/2011  8:08 AM   TAKE these medications         albuterol (2.5 MG/3ML) 0.083% nebulizer solution   Commonly known as: PROVENTIL   Take 2.5 mg by nebulization 2 (two) times daily as needed.      VENTOLIN HFA 108 (90 BASE) MCG/ACT inhaler   Generic drug: albuterol   USE 2 PUFFS EVERY 4 HOURS AS NEEDED FOR WHEEZE      amLODipine 5 MG tablet   Commonly known as: NORVASC   Take 5 mg by mouth daily.      aspirin 325 MG tablet   Take 1 tablet (325 mg total) by mouth daily.      atorvastatin 10 MG tablet   Commonly known as: LIPITOR   Take 10 mg by mouth daily.      Fluticasone-Salmeterol 250-50 MCG/DOSE Aepb   Commonly known as: ADVAIR   Inhale 1 puff into the lungs every 12 (twelve) hours.      GLIPIZIDE XL 10 MG 24 hr tablet   Generic drug: glipiZIDE   TAKE 1 TABLET ONCE DAILY      glucose blood test strip   1 each by Other route 2 (two) times daily. Use as instructed      ibuprofen 200 MG tablet   Commonly known as: ADVIL,MOTRIN   Take 400 mg by mouth every 6 (six) hours as needed. As needed for pain.      Lancets 28G Misc   by Does not apply route 2 (two) times daily.      LANTUS SOLOSTAR 100 UNIT/ML injection   Generic drug: insulin glargine   Inject 10 Units into the skin every evening. 1800      metFORMIN 1000 MG tablet   Commonly known as: GLUCOPHAGE   Take 1 tablet (1,000 mg total) by mouth as directed. 1 at breakfast; 1/2 at lunch and 1 in evening      mometasone 50 MCG/ACT nasal spray   Commonly known as: NASONEX   2 sprays by Nasal route daily.      Pen Needles 5/16" 30G X 8 MM Misc   by Does not  apply route as directed.      vardenafil 10 MG tablet   Commonly known as: LEVITRA   Take 10 mg by mouth daily as needed.             Consults:  Neurology   Significant Diagnostic Studies:  Dg Chest 2 View  12/02/2011  *RADIOLOGY REPORT*  Clinical Data: Repeat study with nipple markers.  CHEST - 2 VIEW  Comparison: 12/01/2011  Findings: The study was repeated with nipple markers.  No nodule is seen within the lungs on today's study.  Therefore, this most likely reflected the nipple shadow.  No suspicious areas currently. Heart is normal size.  IMPRESSION: No evidence of pulmonary nodule.  Original Report Authenticated By: Cyndie Chime, M.D.   Dg Chest 2 View  12/02/2011  *RADIOLOGY REPORT*  Clinical Data: Stroke, blurred vision, COPD, hypertension, diabetes, asthma, smoking history  CHEST - 2 VIEW  Comparison: 05/03/2010  Findings: Upper-normal size of cardiac silhouette. Mediastinal contours and pulmonary vascularity normal. Emphysematous and  minimal chronic bronchitic changes. No acute infiltrate, pleural effusion or pneumothorax. No acute osseous findings. Question left nipple shadow.  IMPRESSION: Emphysematous and minimal chronic bronchitic changes. Question left nipple shadows; recommend repeat PA chest radiograph with nipple markers to exclude pulmonary nodule.  Original Report Authenticated By: Lollie Marrow, M.D.   [name] Head Wo Contrast  12/01/2011  *RADIOLOGY REPORT*  Clinical Data:  71 year old male with headache, blurred vision.  Comparison: None.  MRI HEAD WITHOUT CONTRAST  Technique: Multiplanar, multiecho pulse sequences of the brain and surrounding structures were obtained according to standard protocol without intravenous contrast.  Findings: Scattered small cortical and subcortical white matter infarcts with restricted diffusion in the right hemisphere are wide spread affecting all of the posterior right temporal, occipital, parietal, and superior right frontal lobes.  The  individual lesions are small and without hemorrhage or significant mass effect. Subtle associated T2 and FLAIR hyperintensity is associated. No posterior fossa or left hemisphere diffusion abnormality.  Major intracranial vascular flow voids are preserved.  Superimposed on a acute findings are widespread foci of cerebral white matter T2 and FLAIR hyperintensity.  Partially empty sella.  No ventriculomegaly. No acute intracranial hemorrhage identified.  No extra-axial collection.  Negative cervicomedullary junction and visualized cervical spine.  Normal bone marrow signal.  Paranasal sinus and right mastoid inflammatory changes.  Negative visualized nasopharynx. Visualized orbit soft tissues are within normal limits.  Negative scalp soft tissues.  IMPRESSION: 1.  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 2.  No associated mass effect or hemorrhage.  Chronic underlying nonspecific cerebral white matter signal changes.  MRA HEAD WITHOUT CONTRAST  Technique: Angiographic images of the Circle of Willis were obtained using MRA technique without  intravenous contrast.  Findings: Dominant distal right vertebral artery primarily supplies the basilar.  Poor visualization of the diminutive distal left vertebral artery.  Normal right PICA.  Dominant left AICA.  No basilar stenosis.  Superior cerebellar arteries are within normal limits.  Both PCA have fetal origins, that on the right explaining the infarct pattern on the basis of right carotid disease.  No proximal PCA stenosis.  Visualized bilateral PCA branches are within normal limits.  Antegrade flow in both ICA siphons which are irregular.  No significant left ICA stenosis.  There is a possible filling defect in the proximal cavernous right ICA (series 6 image 75). I favor this is artifact.   The supraclinoid right ICA is patent.  No other hemodynamically significant ICA stenosis occurs. Both ophthalmic and posterior communicating artery origins are within normal limits.  The carotid termini are patent.  The left ACA A1 segment is dominant.  Normal anterior communicating artery.  Median artery of the corpus callosum is present.  Visualized ACA branches are within normal limits.  Left MCA M1 segment is irregular but without significant stenosis.  Visualized left MCA branches are within normal limits.  The right MCA M1 segment is within normal limits.  The right MCA bifurcation is patent.  No major right MCA branch occlusion or high- grade stenosis is identified.  MRA source images do suggest decreased flow in small posterior sylvian division branches (series 6 image 118).  IMPRESSION: 1.  Fetal PCAs, congruent with the MRI findings suggestive of right carotid disease. 2.  Bilateral ICA atherosclerosis.  Possible filling defect or severe plaque in the  right cavernous ICA segment which if genuine causes severe focal stenosis. 3.  Otherwise no proximal anterior circulation stenosis or major branch occlusion.  Decreased flow signal suspected in the right MCA M3 posterior divisions.  Original Report Authenticated By: Harley Hallmark, M.D.   Mr Brain Wo Contrast  12/01/2011  *RADIOLOGY REPORT*  Clinical Data:  71 year old male with headache, blurred vision.  Comparison: None.  MRI HEAD WITHOUT CONTRAST  Technique: Multiplanar, multiecho pulse sequences of the brain and surrounding structures were obtained according to standard protocol without intravenous contrast.  Findings: Scattered small cortical and subcortical white matter infarcts with restricted diffusion in the right hemisphere are wide spread affecting all of the posterior right temporal, occipital, parietal, and superior right frontal lobes.  The individual lesions are small and without hemorrhage or significant mass effect. Subtle associated T2  and FLAIR hyperintensity is associated. No posterior fossa or left hemisphere diffusion abnormality.  Major intracranial vascular flow voids are preserved.  Superimposed on a acute findings are widespread foci of cerebral white matter T2 and FLAIR hyperintensity.  Partially empty sella.  No ventriculomegaly. No acute intracranial hemorrhage identified.  No extra-axial collection.  Negative cervicomedullary junction and visualized cervical spine.  Normal bone marrow signal.  Paranasal sinus and right mastoid inflammatory changes.  Negative visualized nasopharynx. Visualized orbit soft tissues are within normal limits.  Negative scalp soft tissues.  IMPRESSION: 1.  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 2.  No associated mass effect or hemorrhage.  Chronic underlying nonspecific cerebral white matter signal changes.  MRA HEAD WITHOUT CONTRAST  Technique: Angiographic images of the Circle of Willis were obtained using MRA technique without  intravenous contrast.  Findings: Dominant distal right vertebral artery primarily supplies the basilar.  Poor visualization of the diminutive distal left vertebral artery.  Normal right PICA.  Dominant left AICA.  No basilar stenosis.  Superior cerebellar arteries are within normal limits.  Both PCA have fetal origins, that on the right explaining the infarct pattern on the basis of right carotid disease.  No proximal PCA stenosis.  Visualized bilateral PCA branches are within normal limits.  Antegrade flow in both ICA siphons which are irregular.  No significant left ICA stenosis.  There is a possible filling defect in the proximal cavernous right ICA (series 6 image 75). I favor this is artifact.  The supraclinoid right ICA is patent.  No other hemodynamically significant ICA stenosis occurs.  Both ophthalmic and posterior communicating artery origins are within normal limits.  The carotid termini are patent.  The left ACA A1 segment is dominant.  Normal anterior communicating artery.  Median artery of the corpus callosum is present.  Visualized ACA branches are within normal limits.  Left MCA M1 segment is irregular but without significant stenosis.  Visualized left MCA branches are within normal limits.  The right MCA M1 segment is within normal limits.  The right MCA bifurcation is patent.  No major right MCA branch occlusion or high- grade stenosis is identified.  MRA source images do suggest decreased flow in small posterior sylvian division branches (series 6 image 118).  IMPRESSION: 1.  Fetal PCAs, congruent with the MRI findings suggestive of right carotid disease. 2.  Bilateral ICA atherosclerosis.  Possible filling defect or severe plaque in the right cavernous ICA segment which if genuine causes severe  focal stenosis. 3.  Otherwise no proximal anterior circulation stenosis or major branch occlusion.  Decreased flow signal suspected in the right MCA M3 posterior divisions.  Original Report Authenticated By: Harley Hallmark, M.D.    Brief H and P: For complete details please refer to admission H and P, but in brief   Patient is a 71 year old white male, long standing history of tobacco abuse, diabetes, hypertension, dyslipidemia history of CVA presents to the ED with the above-noted complaints. Apparently on Friday the patient started having floaters in both his eyes but predominantly on the left. He then started developing headaches. He started noticing that he could not see things on the periphery of both visual fields. He then went to his ophthalmologist, who noticed he had a bilateral visual field defects and sent it to the ED for further evaluation. 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 The hospitalist service has  not been asked to this patient for evaluation and treatment. Patient denies any other complaints such as dysarthria, or weakness of any part of his body. He denies any headache chest pain, shortness of breath. He denies any nausea vomiting or diarrhea. He denies any abdominal pain.  Hospital Course:  CVA (cerebral infarction)  Patient was admitted to neurology floor and stroke work up was initiated ,neurology service was consulted  Multiple infarcts possibly embolic ,MRA showed right carotid disease MRI /MRA results as above  2D echo showed EF 55-60%, carotid dopplers showed no sig stenosis .TCD done report pending follow as outpatient  .  TEE showed no source of emboli  Cont ASA, statin  Patient was seen by PT/OT and outpatient services recommended however he declined, he was offered home health PT and OT and he also declined.Discussed with Dr Pearlean Brownie who agreed with discharge today on ASA. DIABETES MELLITUS, TYPE II  Uncontrolled,HBA1C 11% lantus and metformin were held on admission and  glipizide was continued ,lantus 10 mg qhs was resumed yesterday ,will also resume metformin on discharge ,patient advised to check his blood glucose 3-4 x day and discuss with his PCP. Hyperlipidemia  Elevated TAG in 400s cont statin  Hypertension  BP stable  COPD  Cont advair  Tobacco abuse:  Counseled strongly on smoking cessation  Polycythemia; Resolved  HB was 18 on admission was thought to be possibly secondary to  COPD ,repeat Hb today is 16 improved with hydration  .monitor as outpatient   Subjective: Patient was seen and examined ,denies any complaints and want to go home     Filed Vitals:   12/04/11 0512  BP: 124/76  Pulse: 62  Temp: 97.7 F (36.5 C)  Resp: 20    General: Alert, awake, oriented x3, in no acute distress.  Heart: Regular rate and rhythm, without murmurs, rubs, gallops.  Lungs: Clear to auscultation bilaterally.  Abdomen: Soft, nontender, nondistended, positive bowel  sounds.  Extremities: No clubbing cyanosis or edema with positive pedal pulses.  Neuro: Grossly intact, nonfocal.    Disposition and Follow-up:  Follow with PCP IN ONE WEEK AND WITH NEUROLOGY IN 3-4 WEEKS    Time spent on Discharge: approximately 50 minutes    Signed: Colinda Barth 12/04/2011, 8:08 AM

## 2011-12-16 ENCOUNTER — Ambulatory Visit: Payer: 59 | Admitting: Family Medicine

## 2011-12-17 ENCOUNTER — Ambulatory Visit: Payer: 59 | Admitting: Family Medicine

## 2011-12-18 ENCOUNTER — Encounter: Payer: Self-pay | Admitting: Family Medicine

## 2011-12-18 ENCOUNTER — Ambulatory Visit (INDEPENDENT_AMBULATORY_CARE_PROVIDER_SITE_OTHER): Payer: Medicare Other | Admitting: Family Medicine

## 2011-12-18 VITALS — BP 130/78 | HR 86 | Temp 97.5°F | Wt 192.2 lb

## 2011-12-18 DIAGNOSIS — F17200 Nicotine dependence, unspecified, uncomplicated: Secondary | ICD-10-CM

## 2011-12-18 DIAGNOSIS — Z720 Tobacco use: Secondary | ICD-10-CM

## 2011-12-18 DIAGNOSIS — I10 Essential (primary) hypertension: Secondary | ICD-10-CM

## 2011-12-18 DIAGNOSIS — I639 Cerebral infarction, unspecified: Secondary | ICD-10-CM

## 2011-12-18 DIAGNOSIS — I6350 Cerebral infarction due to unspecified occlusion or stenosis of unspecified cerebral artery: Secondary | ICD-10-CM

## 2011-12-18 DIAGNOSIS — E785 Hyperlipidemia, unspecified: Secondary | ICD-10-CM

## 2011-12-18 DIAGNOSIS — E119 Type 2 diabetes mellitus without complications: Secondary | ICD-10-CM

## 2011-12-18 MED ORDER — INSULIN GLARGINE 100 UNIT/ML ~~LOC~~ SOLN: 20.0000 [IU] | Freq: Every evening | SUBCUTANEOUS | Status: DC

## 2011-12-18 MED ORDER — MOMETASONE FUROATE 50 MCG/ACT NA SUSP: 2.0000 | Freq: Every day | NASAL | Status: DC

## 2011-12-18 NOTE — Assessment & Plan Note (Signed)
This is very poorly controlled from lack of compliance with lifestyle and med and sugar checks and f/u Will inc lantus to 20 u Check sugar bid and keep me updated  Disc eye and foot care  Continue glipizide and metformin DM diet disc- has done his education Lab and f/u 3 mo or earlier if needed

## 2011-12-18 NOTE — Assessment & Plan Note (Signed)
Disc in detail risks of smoking and possible outcomes including copd, vascular/ heart disease, cancer , respiratory and sinus infections  Pt voices understanding  Pt has copd and now had a stroke He does not seem motivated for change despite this however

## 2011-12-18 NOTE — Assessment & Plan Note (Signed)
bp in fair control at this time  No changes needed  Disc lifstyle change with low sodium diet and exercise   Disc imp of control to prev further strokes

## 2011-12-18 NOTE — Progress Notes (Signed)
Subjective:    Patient ID: Jonathan [name], male    DOB: [birth year], 71 y.o.   MRN: [id number]  HPI Here for hospital follow up after cva Pt was in [facility] 3/4-3/7 dor CVA causing vision change and HA Is feeling pretty good    MRI/ MRA showed several small R  Strokes in MCA and PCA distribution with R carotid stenosis on MRA (not doppler) Had 2D echo and TEE which showed no thrombus Has appt with neurology - Dr Pearlean Brownie   Occasional smoker for 2 mo before stroke 0-1 per week  Now still an occasional -- smoker   Wt by our scale - is 10 lb over  He claims no wt gain - same weight at home  Eating a very low fat diet - even before stroke No more junk food at all now  Also low salt   Is not exercising due to sob  Can walk 5-10 minutes max - but can try    bp is  130/78   Today No cp or palpitations or headaches or edema  No side effects to medicines     a1c rose to 11.1 Is now on glipizide and metformin and lantus  Before the hospitalization - checking sugars in the am  120-135 Was not checking in pm at all  Has had DM teaching in the past  Right now is eating a diabetic diet  ams 120s-140s   Now eating regular meals    Lab Results  Component Value Date   CHOL 142 12/02/2011   HDL 39* 12/02/2011   LDLCALC UNABLE TO CALCULATE IF TRIGLYCERIDE OVER 400 mg/dL 4/0/9811   LDLDIRECT 91.4 01/21/2011   TRIG 442* 12/02/2011   CHOLHDL 3.6 12/02/2011     Patient Active Problem List  Diagnoses  . DIABETES MELLITUS, TYPE II  . HYPERLIPIDEMIA  . HYPERKALEMIA  . ERECTILE DYSFUNCTION  . HYPERTENSION  . ALLERGIC  RHINITIS  . ASTHMA  . COPD  . KIDNEY STONE  . DERMATITIS, SEBORRHEIC  . TOBACCO ABUSE, HX OF  . HTN (hypertension)  . HLD (hyperlipidemia)  . Pulmonary nodule, right  . COPD (chronic obstructive pulmonary disease)  . Rotator cuff injury  . Frozen shoulder  . Shoulder pain  . Hyperkalemia  . Dermatitis seborrheica  . Kidney stone  . Allergic rhinitis  . ED  (erectile dysfunction)  . Diabetes mellitus type II  . Asthma  . Tobacco abuse  . CVA (cerebral infarction)   Past Medical History  Diagnosis Date  . HTN (hypertension)   . HLD (hyperlipidemia)   . Pulmonary nodule, right     lower lobe  . COPD (chronic obstructive pulmonary disease)   . Rotator cuff injury     right  . Frozen shoulder   . Shoulder pain   . Hyperkalemia   . Dermatitis seborrheica   . Other diseases of lung, not elsewhere classified   . Kidney stone   . Allergic rhinitis   . ED (erectile dysfunction)   . Diabetes mellitus type II   . Asthma   . Tobacco abuse    Past Surgical History  Procedure Date  . Cervical discectomy 1998  . Ett 11/1994  . Tm repair   . Tee without cardioversion 12/03/2011    Procedure: TRANSESOPHAGEAL ECHOCARDIOGRAM (TEE);  Surgeon: Lewayne Bunting, MD;  Location: [facility] ENDOSCOPY;  Service: Cardiovascular;  Laterality: N/A;   History  Substance Use Topics  . Smoking status: Current Everyday Smoker -- 0.5 packs/day  Types: Cigarettes  . Smokeless tobacco: Never Used   Comment: prev smoked 1 1/2 PPD. Currently 5 cigs daily  . Alcohol Use: No     Quit 40 years ago   Family History  Problem Relation Age of Onset  . Asthma Sister   . Emphysema Sister    Allergies  Allergen Reactions  . Augmentin Other (See Comments)    Severe stomach pain.   . Quinapril Hcl     REACTION: back pain  . Valsartan     REACTION: back pain  . Varenicline Tartrate     REACTION: AMS, hallucinations, night mares   Current Outpatient Prescriptions on File Prior to Visit  Medication Sig Dispense Refill  . albuterol (PROVENTIL) (2.5 MG/3ML) 0.083% nebulizer solution Take 2.5 mg by nebulization 2 (two) times daily as needed.        [name] amLODipine (NORVASC) 5 MG tablet Take 5 mg by mouth daily.        [name] aspirin 325 MG tablet Take 1 tablet (325 mg total) by mouth daily.  30 tablet  0  . atorvastatin (LIPITOR) 10 MG tablet Take 10 mg by mouth daily.          . Fluticasone-Salmeterol (ADVAIR DISKUS) 250-50 MCG/DOSE AEPB Inhale 1 puff into the lungs every 12 (twelve) hours.  60 each  3  . GLIPIZIDE XL 10 MG 24 hr tablet TAKE 1 TABLET ONCE DAILY  90 tablet  0  . glucose blood test strip 1 each by Other route 2 (two) times daily. Use as instructed       . ibuprofen (ADVIL,MOTRIN) 200 MG tablet Take 400 mg by mouth every 6 (six) hours as needed. As needed for pain.      . Insulin Pen Needle (PEN NEEDLES 5/16") 30G X 8 MM MISC by Does not apply route as directed.        . Lancets 28G MISC by Does not apply route 2 (two) times daily.        . metFORMIN (GLUCOPHAGE) 1000 MG tablet Take 1 tablet (1,000 mg total) by mouth as directed. 1 at breakfast; 1/2 at lunch and 1 in evening  270 tablet  2  . vardenafil (LEVITRA) 10 MG tablet Take 10 mg by mouth daily as needed.        . VENTOLIN HFA 108 (90 BASE) MCG/ACT inhaler USE 2 PUFFS EVERY 4 HOURS AS NEEDED FOR WHEEZE  1 Inhaler  11      Review of Systems Review of Systems  Constitutional: Negative for fever, appetite change, fatigue and unexpected weight change.  Eyes: Negative for pain and visual disturbance.  Respiratory: Negative for cough and shortness of breath.   Cardiovascular: Negative for cp or palpitations    Gastrointestinal: Negative for nausea, diarrhea and constipation.  Genitourinary: Negative for urgency and frequency. was having thirst when bp was high Skin: Negative for pallor or rash   Neurological: Negative for weakness, light-headedness, numbness and headaches. (his strength is improved) Hematological: Negative for adenopathy. Does not bruise/bleed easily.  Psychiatric/Behavioral: Negative for dysphoric mood. The patient is not nervous/anxious.          Objective:   Physical Exam  Constitutional: He appears well-developed and well-nourished. No distress.       overwt and well appearing   HENT:  Head: Normocephalic and atraumatic.  Mouth/Throat: Oropharynx is clear and moist.   Eyes: Conjunctivae and EOM are normal. Pupils are equal, round, and reactive to light. No scleral icterus.  Neck: Normal range of motion. Neck supple. No JVD present. Carotid bruit is not present. No thyromegaly present.  Cardiovascular: Normal rate, regular rhythm, normal heart sounds and intact distal pulses.  Exam reveals no gallop.   Pulmonary/Chest: Breath sounds normal. No respiratory distress. He has no wheezes. He exhibits no tenderness.       Diffusely distant bs   Abdominal: Soft. Bowel sounds are normal. He exhibits no distension, no abdominal bruit and no mass. There is no tenderness.  Musculoskeletal: Normal range of motion. He exhibits no edema and no tenderness.  Lymphadenopathy:    He has no cervical adenopathy.  Neurological: He is alert. He has normal reflexes. No cranial nerve deficit. He exhibits normal muscle tone. Coordination normal.  Skin: Skin is warm and dry. No rash noted. No erythema. No pallor.  Psychiatric: He has a normal mood and affect.          Assessment & Plan:

## 2011-12-18 NOTE — Patient Instructions (Addendum)
Start walking 5 minutes per day - and advance as tolerated Talk to your lung doctor about pulmonary rehabilitation  You must quit smoking 100%now  Increase lantus insulin to 20 units once daily in evening See the neurologist as planned Schedule fasting labs and then follow up with me in 3 months Stick with diabetic diet  Check sugar twice daily- in am and 2 hours after evening meal

## 2011-12-18 NOTE — Assessment & Plan Note (Addendum)
S/p hosp for R sided infarcts in MCA and PCA distribution -presumably from R carotid stenosis Rev hosp records and studies/ labs in detail with pt today  Made it clear that he needs to stay on asa 325 as well as work hard on lifestyle change for diet and exercise Long disc about motivation and responsibility for his own health (as family members seem to walk him through everything) Will f/u Dr Pearlean Brownie as planned Work on better gluc control  >40 min spent with face to face with patient, >50% counseling and/or coordinating care   Lab and f/u 3 mo

## 2011-12-18 NOTE — Assessment & Plan Note (Signed)
Currently on lipitor Lab Results  Component Value Date   CHOL 142 12/02/2011   HDL 39* 12/02/2011   LDLCALC UNABLE TO CALCULATE IF TRIGLYCERIDE OVER 400 mg/dL 4/0/9811   LDLDIRECT 91.4 01/21/2011   TRIG 442* 12/02/2011   CHOLHDL 3.6 12/02/2011   this was not fasting Will re check at f/u  Disc goals for lipids and reasons to control them Rev labs with pt Rev low sat fat diet in detail

## 2012-01-19 ENCOUNTER — Other Ambulatory Visit: Payer: Self-pay | Admitting: Family Medicine

## 2012-02-03 ENCOUNTER — Other Ambulatory Visit: Payer: Self-pay | Admitting: *Deleted

## 2012-02-03 MED ORDER — "PEN NEEDLES 5/16"" 30G X 8 MM MISC": 1.0000 | Freq: Every day | Status: DC | PRN

## 2012-02-06 ENCOUNTER — Other Ambulatory Visit: Payer: Self-pay | Admitting: *Deleted

## 2012-02-06 MED ORDER — "PEN NEEDLES 5/16"" 30G X 8 MM MISC": 1.0000 | Freq: Every day | Status: DC | PRN

## 2012-02-09 ENCOUNTER — Other Ambulatory Visit: Payer: Self-pay

## 2012-02-09 ENCOUNTER — Other Ambulatory Visit: Payer: Self-pay | Admitting: *Deleted

## 2012-02-09 MED ORDER — "PEN NEEDLES 5/16"" 30G X 8 MM MISC": 1.0000 | Freq: Every day | Status: DC | PRN

## 2012-02-09 NOTE — Telephone Encounter (Signed)
Pt has went to [facility] and no refill on pen needles. I spoke with [facility] and they are getting ready now. Pt will go back to [facility].

## 2012-02-25 ENCOUNTER — Other Ambulatory Visit: Payer: Self-pay | Admitting: Family Medicine

## 2012-03-04 ENCOUNTER — Other Ambulatory Visit: Payer: Self-pay

## 2012-03-04 MED ORDER — GLUCOSE BLOOD VI STRP: ORAL_STRIP | Status: DC

## 2012-03-04 NOTE — Telephone Encounter (Signed)
Pt request freestyle test strips # 100 x 6 to [facility]. Pt notified while on phone; done.

## 2012-03-09 ENCOUNTER — Other Ambulatory Visit: Payer: Self-pay | Admitting: Family Medicine

## 2012-03-09 NOTE — Telephone Encounter (Signed)
Done

## 2012-03-18 ENCOUNTER — Telehealth: Payer: Self-pay | Admitting: Family Medicine

## 2012-03-18 DIAGNOSIS — E119 Type 2 diabetes mellitus without complications: Secondary | ICD-10-CM

## 2012-03-18 DIAGNOSIS — E785 Hyperlipidemia, unspecified: Secondary | ICD-10-CM

## 2012-03-18 DIAGNOSIS — I10 Essential (primary) hypertension: Secondary | ICD-10-CM

## 2012-03-18 NOTE — Telephone Encounter (Signed)
Message copied by Judy Pimple on Thu Mar 18, 2012  7:44 PM ------      Message from: Jonathan [name]      Created: Tue Mar 16, 2012  7:57 AM      Regarding: f/u labs Fri 6/21       Please order  future f/u labs for pt's upcomming lab appt.      Thanks      Rodney Booze

## 2012-03-19 ENCOUNTER — Telehealth: Payer: Self-pay

## 2012-03-19 ENCOUNTER — Other Ambulatory Visit (INDEPENDENT_AMBULATORY_CARE_PROVIDER_SITE_OTHER): Payer: Medicare Other

## 2012-03-19 DIAGNOSIS — I10 Essential (primary) hypertension: Secondary | ICD-10-CM

## 2012-03-19 DIAGNOSIS — E785 Hyperlipidemia, unspecified: Secondary | ICD-10-CM

## 2012-03-19 DIAGNOSIS — E119 Type 2 diabetes mellitus without complications: Secondary | ICD-10-CM

## 2012-03-19 LAB — CBC WITH DIFFERENTIAL/PLATELET
Basophils Absolute: 0 10*3/uL (ref 0.0–0.1)
Basophils Relative: 0.4 % (ref 0.0–3.0)
Eosinophils Absolute: 0.3 10*3/uL (ref 0.0–0.7)
Eosinophils Relative: 2.9 % (ref 0.0–5.0)
HCT: 49.5 % (ref 39.0–52.0)
Hemoglobin: 16.6 g/dL (ref 13.0–17.0)
Lymphocytes Relative: 29 % (ref 12.0–46.0)
Lymphs Abs: 3.3 10*3/uL (ref 0.7–4.0)
MCHC: 33.6 g/dL (ref 30.0–36.0)
MCV: 90.6 fl (ref 78.0–100.0)
Monocytes Absolute: 0.7 10*3/uL (ref 0.1–1.0)
Monocytes Relative: 6 % (ref 3.0–12.0)
Neutro Abs: 7 10*3/uL (ref 1.4–7.7)
Neutrophils Relative %: 61.7 % (ref 43.0–77.0)
Platelets: 246 10*3/uL (ref 150.0–400.0)
RBC: 5.46 Mil/uL (ref 4.22–5.81)
RDW: 13.7 % (ref 11.5–14.6)
WBC: 11.3 10*3/uL — ABNORMAL HIGH (ref 4.5–10.5)

## 2012-03-19 LAB — COMPREHENSIVE METABOLIC PANEL
ALT: 21 U/L (ref 0–53)
AST: 21 U/L (ref 0–37)
Albumin: 4 g/dL (ref 3.5–5.2)
Alkaline Phosphatase: 95 U/L (ref 39–117)
BUN: 16 mg/dL (ref 6–23)
CO2: 29 mEq/L (ref 19–32)
Calcium: 9.6 mg/dL (ref 8.4–10.5)
Chloride: 100 mEq/L (ref 96–112)
Creatinine, Ser: 1 mg/dL (ref 0.4–1.5)
GFR: 74.67 mL/min (ref >60.00)
Glucose, Bld: 358 mg/dL — ABNORMAL HIGH (ref 70–99)
Potassium: 4.9 mEq/L (ref 3.5–5.1)
Sodium: 139 mEq/L (ref 135–145)
Total Bilirubin: 0.9 mg/dL (ref 0.3–1.2)
Total Protein: 6.8 g/dL (ref 6.0–8.3)

## 2012-03-19 LAB — TSH: TSH: 4.1 u[IU]/mL (ref 0.35–5.50)

## 2012-03-19 LAB — LIPID PANEL
Cholesterol: 99 mg/dL (ref 0–200)
HDL: 30 mg/dL — ABNORMAL LOW (ref >39.00)
Total CHOL/HDL Ratio: 3
Triglycerides: 240 mg/dL — ABNORMAL HIGH (ref 0.0–149.0)
VLDL: 48 mg/dL — ABNORMAL HIGH (ref 0.0–40.0)

## 2012-03-19 LAB — HEMOGLOBIN A1C: Hgb A1c MFr Bld: 7.7 % — ABNORMAL HIGH (ref 4.6–6.5)

## 2012-03-19 LAB — LDL CHOLESTEROL, DIRECT: Direct LDL: 46.8 mg/dL

## 2012-03-19 NOTE — Telephone Encounter (Signed)
Since Dr Milinda Antis is out of office until 03/29/12 would you advise if OK to wait on her return for recent lab WBC 11.3  Glucose 358

## 2012-03-22 NOTE — Telephone Encounter (Signed)
plz notify pt sugar was high at 358.  To watch diet and closely keep eye on sugars until appt next week.  Update [place] if going higher Also, white count was a bit elevated - any evidence of infection currently?

## 2012-03-22 NOTE — Telephone Encounter (Signed)
Patient notified. He said he had just eaten breakfast prior to his blood draw and feels certain that's why his sugar was high, because its been running good. He hasn't noticed any signs or symptoms of infection and has felt fine.

## 2012-03-29 ENCOUNTER — Encounter: Payer: Self-pay | Admitting: Family Medicine

## 2012-03-29 ENCOUNTER — Ambulatory Visit (INDEPENDENT_AMBULATORY_CARE_PROVIDER_SITE_OTHER): Payer: Medicare Other | Admitting: Family Medicine

## 2012-03-29 VITALS — BP 137/75 | HR 83 | Temp 98.0°F | Wt 192.8 lb

## 2012-03-29 DIAGNOSIS — E785 Hyperlipidemia, unspecified: Secondary | ICD-10-CM

## 2012-03-29 DIAGNOSIS — Z720 Tobacco use: Secondary | ICD-10-CM

## 2012-03-29 DIAGNOSIS — I10 Essential (primary) hypertension: Secondary | ICD-10-CM

## 2012-03-29 DIAGNOSIS — F17200 Nicotine dependence, unspecified, uncomplicated: Secondary | ICD-10-CM

## 2012-03-29 DIAGNOSIS — E119 Type 2 diabetes mellitus without complications: Secondary | ICD-10-CM

## 2012-03-29 MED ORDER — AMLODIPINE BESYLATE 5 MG PO TABS: 5.0000 mg | ORAL_TABLET | Freq: Every day | ORAL | Status: DC

## 2012-03-29 NOTE — Assessment & Plan Note (Signed)
Improved with better diet and sugars and statin  Disc goals for lipids and reasons to control them Rev labs with pt Rev low sat fat diet in detail  commended

## 2012-03-29 NOTE — Patient Instructions (Addendum)
Don't forget to make your yearly eye doctor appt  Stick with diabetic diet and also be as active as you can  Schedule non fasting lab and follow up in 3 months  Work on quitting smoking completely

## 2012-03-29 NOTE — Assessment & Plan Note (Signed)
Big improvement with a1c of 7.7 with current tx incl lantus Rev bid sugar readings - much better-will watch for lows Expect further imp-f/u 3 mo  Will make own eye doctor appt before then also  Hope will be able to stop the glipizide in the future  Wishes he could exercise more - limited by copd

## 2012-03-29 NOTE — Assessment & Plan Note (Signed)
Improving slowly - smoking less Contemplating a non nicotine elect cigarette at this time  Lungs sound better Enc to go on to quit

## 2012-03-29 NOTE — Assessment & Plan Note (Signed)
In pt post stroke- doing well bp in fair control at this time  No changes needed  Disc lifstyle change with low sodium diet and exercise
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
## 2012-04-16 ENCOUNTER — Other Ambulatory Visit: Payer: Self-pay

## 2012-04-16 MED ORDER — ATORVASTATIN CALCIUM 10 MG PO TABS: 10.0000 mg | ORAL_TABLET | Freq: Every day | ORAL | Status: DC

## 2012-04-16 NOTE — Telephone Encounter (Signed)
Pt request refill Lipitor # 90 x 3 to [facility]. Patient notified as instructed by telephone refill sent.

## 2012-06-29 ENCOUNTER — Other Ambulatory Visit (INDEPENDENT_AMBULATORY_CARE_PROVIDER_SITE_OTHER): Payer: Medicare Other

## 2012-06-29 DIAGNOSIS — E119 Type 2 diabetes mellitus without complications: Secondary | ICD-10-CM

## 2012-06-29 LAB — HEMOGLOBIN A1C: Hgb A1c MFr Bld: 7.5 % — ABNORMAL HIGH (ref 4.6–6.5)

## 2012-07-05 ENCOUNTER — Encounter: Payer: Self-pay | Admitting: Family Medicine

## 2012-07-05 ENCOUNTER — Ambulatory Visit (INDEPENDENT_AMBULATORY_CARE_PROVIDER_SITE_OTHER): Payer: Medicare Other | Admitting: Family Medicine

## 2012-07-05 VITALS — BP 120/68 | HR 100 | Temp 98.2°F | Ht 67.0 in | Wt 191.5 lb

## 2012-07-05 DIAGNOSIS — Z720 Tobacco use: Secondary | ICD-10-CM

## 2012-07-05 DIAGNOSIS — I10 Essential (primary) hypertension: Secondary | ICD-10-CM

## 2012-07-05 DIAGNOSIS — Z2821 Immunization not carried out because of patient refusal: Secondary | ICD-10-CM

## 2012-07-05 DIAGNOSIS — F17200 Nicotine dependence, unspecified, uncomplicated: Secondary | ICD-10-CM

## 2012-07-05 DIAGNOSIS — E1165 Type 2 diabetes mellitus with hyperglycemia: Secondary | ICD-10-CM

## 2012-07-05 DIAGNOSIS — F4321 Adjustment disorder with depressed mood: Secondary | ICD-10-CM

## 2012-07-05 NOTE — Assessment & Plan Note (Signed)
DM is slowly improving - set back with grief causing some stress related am sugar increases  Enc to continue good diet and work on smoking  Will schedule own eye exam No med change F/u 3 mo with lab prior

## 2012-07-05 NOTE — Progress Notes (Signed)
Subjective:    Patient ID: Jonathan [name], male    DOB: 09/28/1941, 71 y.o.   MRN: [id number]  HPI Here for f/u of chronic conditions  Wt is stable with bmi of 29  bp is stable today  No cp or palpitations or headaches or edema  No side effects to medicines  BP Readings from Last 3 Encounters:  07/05/12 120/68  03/29/12 137/75  12/18/11 130/78      Diabetes Home sugar results - glucose is all over the place - he thinks from stress Brother died - parkinsons, and then fast moving infection -- very unexpected , did have diabetes Pm sugars 120 and under , and in the am - goes way up (despite no PM snack)- can be as high as 150s DM diet - still doing very well with that  Exercise - as much as she can do with his copd Symptoms-none  A1C last  Lab Results  Component Value Date   HGBA1C 7.5* 06/29/2012    No problems with medications  On lantus and glipizide  Renal protection Last eye exam -has not had that yet   Had to wear a heart monitor for a month  Had a sleep study   Smoking status - more lately due to stress He is just now ready to quit   imms --still declines pnumovax and flu- no reason  , and understands risks   Patient Active Problem List  Diagnosis  . HYPERKALEMIA  . ERECTILE DYSFUNCTION  . HYPERTENSION  . ALLERGIC  RHINITIS  . ASTHMA  . COPD  . KIDNEY STONE  . DERMATITIS, SEBORRHEIC  . HLD (hyperlipidemia)  . Pulmonary nodule, right  . Rotator cuff injury  . Frozen shoulder  . Shoulder pain  . Hyperkalemia  . Dermatitis seborrheica  . Kidney stone  . Allergic rhinitis  . ED (erectile dysfunction)  . Diabetes type 2, uncontrolled  . Asthma  . Tobacco abuse  . CVA (cerebral infarction)   Past Medical History  Diagnosis Date  . HTN (hypertension)   . HLD (hyperlipidemia)   . Pulmonary nodule, right     lower lobe  . COPD (chronic obstructive pulmonary disease)   . Rotator cuff injury     right  . Frozen shoulder   . Shoulder pain   .  Hyperkalemia   . Dermatitis seborrheica   . Other diseases of lung, not elsewhere classified   . Kidney stone   . Allergic rhinitis   . ED (erectile dysfunction)   . Diabetes mellitus type II   . Asthma   . Tobacco abuse    Past Surgical History  Procedure Date  . Cervical discectomy 1998  . Ett 11/1994  . Tm repair   . Tee without cardioversion 12/03/2011    Procedure: TRANSESOPHAGEAL ECHOCARDIOGRAM (TEE);  Surgeon: Lewayne Bunting, MD;  Location: [facility] ENDOSCOPY;  Service: Cardiovascular;  Laterality: N/A;   History  Substance Use Topics  . Smoking status: Current Every Day Smoker -- 0.2 packs/day    Types: Cigarettes  . Smokeless tobacco: Never Used   Comment: prev smoked 1 1/2 PPD. Currently 5 cigs daily  . Alcohol Use: No     Quit 40 years ago   Family History  Problem Relation Age of Onset  . Asthma Sister   . Emphysema Sister    Allergies  Allergen Reactions  . Amoxicillin-Pot Clavulanate Other (See Comments)    Severe stomach pain.   . Quinapril Hcl  REACTION: back pain  . Valsartan     REACTION: back pain  . Varenicline Tartrate     REACTION: AMS, hallucinations, night mares   Current Outpatient Prescriptions on File Prior to Visit  Medication Sig Dispense Refill  . albuterol (PROVENTIL) (2.5 MG/3ML) 0.083% nebulizer solution Take 2.5 mg by nebulization 2 (two) times daily as needed.        [name] amLODipine (NORVASC) 5 MG tablet Take 1 tablet (5 mg total) by mouth daily.  90 tablet  3  . aspirin 325 MG tablet Take 1 tablet (325 mg total) by mouth daily.  30 tablet  0  . atorvastatin (LIPITOR) 10 MG tablet Take 1 tablet (10 mg total) by mouth daily.  90 tablet  3  . Fluticasone-Salmeterol (ADVAIR DISKUS) 250-50 MCG/DOSE AEPB Inhale 1 puff into the lungs every 12 (twelve) hours.  60 each  3  . glipiZIDE (GLIPIZIDE XL) 10 MG 24 hr tablet Take 1 tablet (10 mg total) by mouth daily.  90 tablet  3  . glucose blood (FREESTYLE TEST STRIPS) test strip Check blood sugar  twice a day and as needed. 250.00  100 each  6  . ibuprofen (ADVIL,MOTRIN) 200 MG tablet Take 400 mg by mouth every 6 (six) hours as needed. As needed for pain.      [name] insulin glargine (LANTUS SOLOSTAR) 100 UNIT/ML injection Inject 20 Units into the skin every evening. 1800  10 mL  11  . Insulin Pen Needle (PEN NEEDLES 5/16") 30G X 8 MM MISC Inject 1 each into the skin daily as needed.  100 each  11  . Lancets 28G MISC by Does not apply route 2 (two) times daily.        . metFORMIN (GLUCOPHAGE) 1000 MG tablet TAKE ONE (1) TABLET AT BREAKFAST, TAKE ONE-HALF (1/2) TABLET AT LUNCH AND TAKE ONE (1) TABLET IN THE EVENING AS DIRECTED  225 tablet  1  . mometasone (NASONEX) 50 MCG/ACT nasal spray Place 2 sprays into the nose daily.  17 g  11  . vardenafil (LEVITRA) 10 MG tablet Take 10 mg by mouth daily as needed.        . VENTOLIN HFA 108 (90 BASE) MCG/ACT inhaler USE 2 PUFFS EVERY 4 HOURS AS NEEDED FOR WHEEZE  1 Inhaler  11         Review of Systems    Review of Systems  Constitutional: Negative for fever, appetite change,  and unexpected weight change. pos for fatigue  Eyes: Negative for pain and visual disturbance.  Respiratory: Negative for cough and pos for baseline sob from copd   Cardiovascular: Negative for cp or palpitations    Gastrointestinal: Negative for nausea, diarrhea and constipation.  Genitourinary: Negative for urgency and frequency.  Skin: Negative for pallor or rash   Neurological: Negative for weakness, light-headedness, numbness and headaches.  Hematological: Negative for adenopathy. Does not bruise/bleed easily.  Psychiatric/Behavioral: Negative for dysphoric mood. The patient is anxious and experiencing grief     Objective:   Physical Exam  Constitutional: He appears well-developed and well-nourished. No distress.       overwt and well appearing   HENT:  Head: Normocephalic and atraumatic.  Mouth/Throat: Oropharynx is clear and moist.  Eyes: Conjunctivae normal  and EOM are normal. Pupils are equal, round, and reactive to light. Right eye exhibits no discharge. Left eye exhibits no discharge. No scleral icterus.  Neck: Normal range of motion. Neck supple. No JVD present. Carotid bruit is not  present. No thyromegaly present.  Cardiovascular: Normal rate, regular rhythm, normal heart sounds and intact distal pulses.  Exam reveals no gallop.   Pulmonary/Chest: Effort normal and breath sounds normal. No respiratory distress. He has no wheezes.  Abdominal: Soft. Bowel sounds are normal. He exhibits no distension, no abdominal bruit and no mass. There is no tenderness.  Musculoskeletal: He exhibits no edema and no tenderness.  Lymphadenopathy:    He has no cervical adenopathy.  Neurological: He is alert. He has normal reflexes. No cranial nerve deficit. He exhibits normal muscle tone. Coordination normal.  Skin: Skin is warm and dry. No rash noted. No erythema. No pallor.  Psychiatric: His speech is normal and behavior is normal. Thought content normal. His affect is not labile and not inappropriate. He exhibits a depressed mood.       Affect is slightly down - is still tearful with grief - but overall pleasant and conversative          Assessment & Plan:

## 2012-07-05 NOTE — Assessment & Plan Note (Signed)
Overall doing as well as can be expected Tearful at times but denies depression  Expresses grief well Will call if interested in counseling in the future

## 2012-07-05 NOTE — Patient Instructions (Addendum)
No change in medicine I think grief and stress have caused am sugars to be high and these will eventually normalize  If you ever change your mind about immunizations (flu/ pneumonia)- I think those are very important If you are interested in grief counseling let me know  Don't forget to get your yearly eye exam  Schedule follow up with fasting labs prior in 3 months

## 2012-07-05 NOTE — Assessment & Plan Note (Signed)
Disc in detail risks of smoking and possible outcomes including copd, vascular/ heart disease, cancer , respiratory and sinus infections  Pt voices understanding Pt not yet ready to quit Also declines imms- which is unfortunate

## 2012-07-05 NOTE — Assessment & Plan Note (Signed)
bp in fair control at this time  No changes needed  Disc lifstyle change with low sodium diet and exercise   

## 2012-09-30 ENCOUNTER — Other Ambulatory Visit (INDEPENDENT_AMBULATORY_CARE_PROVIDER_SITE_OTHER): Payer: Medicare Other

## 2012-09-30 ENCOUNTER — Telehealth: Payer: Self-pay | Admitting: Family Medicine

## 2012-09-30 DIAGNOSIS — E785 Hyperlipidemia, unspecified: Secondary | ICD-10-CM

## 2012-09-30 DIAGNOSIS — E1165 Type 2 diabetes mellitus with hyperglycemia: Secondary | ICD-10-CM

## 2012-09-30 DIAGNOSIS — I10 Essential (primary) hypertension: Secondary | ICD-10-CM

## 2012-09-30 LAB — COMPREHENSIVE METABOLIC PANEL
ALT: 24 U/L (ref 0–53)
AST: 24 U/L (ref 0–37)
Albumin: 4.4 g/dL (ref 3.5–5.2)
Alkaline Phosphatase: 72 U/L (ref 39–117)
BUN: 16 mg/dL (ref 6–23)
CO2: 28 mEq/L (ref 19–32)
Calcium: 9.2 mg/dL (ref 8.4–10.5)
Chloride: 96 mEq/L (ref 96–112)
Creatinine, Ser: 1 mg/dL (ref 0.4–1.5)
GFR: 78.01 mL/min (ref >60.00)
Glucose, Bld: 217 mg/dL — ABNORMAL HIGH (ref 70–99)
Potassium: 3.7 mEq/L (ref 3.5–5.1)
Sodium: 135 mEq/L (ref 135–145)
Total Bilirubin: 1.3 mg/dL — ABNORMAL HIGH (ref 0.3–1.2)
Total Protein: 7.8 g/dL (ref 6.0–8.3)

## 2012-09-30 LAB — LIPID PANEL
Cholesterol: 131 mg/dL (ref 0–200)
HDL: 37.4 mg/dL — ABNORMAL LOW (ref >39.00)
Total CHOL/HDL Ratio: 4
Triglycerides: 201 mg/dL — ABNORMAL HIGH (ref 0.0–149.0)
VLDL: 40.2 mg/dL — ABNORMAL HIGH (ref 0.0–40.0)

## 2012-09-30 LAB — LDL CHOLESTEROL, DIRECT: Direct LDL: 75.4 mg/dL

## 2012-09-30 LAB — HEMOGLOBIN A1C: Hgb A1c MFr Bld: 9 % — ABNORMAL HIGH (ref 4.6–6.5)

## 2012-09-30 NOTE — Telephone Encounter (Signed)
Message copied by Judy Pimple on Thu Sep 30, 2012 10:15 AM ------      Message from: Alvina Chou      Created: Mon Sep 20, 2012  4:28 PM      Regarding: labs for Thursday,1.2.14       Fasting labs

## 2012-10-05 ENCOUNTER — Ambulatory Visit: Payer: Medicare Other | Admitting: Family Medicine

## 2012-11-22 ENCOUNTER — Encounter: Payer: Self-pay | Admitting: Family Medicine

## 2012-11-22 ENCOUNTER — Ambulatory Visit (INDEPENDENT_AMBULATORY_CARE_PROVIDER_SITE_OTHER): Payer: Medicare Other | Admitting: Family Medicine

## 2012-11-22 VITALS — BP 126/74 | HR 100 | Temp 98.5°F | Ht 67.0 in | Wt 189.8 lb

## 2012-11-22 DIAGNOSIS — F17200 Nicotine dependence, unspecified, uncomplicated: Secondary | ICD-10-CM

## 2012-11-22 DIAGNOSIS — E1165 Type 2 diabetes mellitus with hyperglycemia: Secondary | ICD-10-CM

## 2012-11-22 DIAGNOSIS — I10 Essential (primary) hypertension: Secondary | ICD-10-CM

## 2012-11-22 DIAGNOSIS — Z720 Tobacco use: Secondary | ICD-10-CM

## 2012-11-22 MED ORDER — METFORMIN HCL 1000 MG PO TABS: ORAL_TABLET | ORAL | Status: DC

## 2012-11-22 MED ORDER — "PEN NEEDLES 5/16"" 30G X 8 MM MISC": 1.0000 | Freq: Every day | Status: DC | PRN

## 2012-11-22 MED ORDER — ATORVASTATIN CALCIUM 10 MG PO TABS: 10.0000 mg | ORAL_TABLET | Freq: Every day | ORAL | Status: DC

## 2012-11-22 MED ORDER — AMLODIPINE BESYLATE 5 MG PO TABS: 5.0000 mg | ORAL_TABLET | Freq: Every day | ORAL | Status: DC

## 2012-11-22 MED ORDER — ALBUTEROL SULFATE HFA 108 (90 BASE) MCG/ACT IN AERS: 2.0000 | INHALATION_SPRAY | Freq: Four times a day (QID) | RESPIRATORY_TRACT | Status: DC | PRN

## 2012-11-22 MED ORDER — INSULIN GLARGINE 100 UNIT/ML ~~LOC~~ SOLN: 40.0000 [IU] | Freq: Every evening | SUBCUTANEOUS | Status: DC

## 2012-11-22 MED ORDER — GLIPIZIDE ER 10 MG PO TB24: 10.0000 mg | ORAL_TABLET | Freq: Every day | ORAL | Status: DC

## 2012-11-22 NOTE — Progress Notes (Signed)
Subjective:    Patient ID: Jonathan [name], male    DOB: 19-Aug-1941, 72 y.o.   MRN: [id number]  HPI Here for f/u of chronic health problems  Is feeling fine overall   Wt is down 2 lb  bp is stable today  No cp or palpitations or headaches or edema  No side effects to medicines  BP Readings from Last 3 Encounters:  11/22/12 126/74  07/05/12 120/68  03/29/12 137/75     Hyperlipidemia Lab Results  Component Value Date   CHOL 131 09/30/2012   CHOL 99 03/19/2012   CHOL 142 12/02/2011   Lab Results  Component Value Date   HDL 37.40* 09/30/2012   HDL 40.98* 03/19/2012   HDL 39* 12/02/2011   Lab Results  Component Value Date   LDLCALC UNABLE TO CALCULATE IF TRIGLYCERIDE OVER 400 mg/dL 09/29/9145   LDLCALC 72 05/27/5620   LDLCALC 61 11/24/2006   Lab Results  Component Value Date   TRIG 201.0* 09/30/2012   TRIG 240.0* 03/19/2012   TRIG 442* 12/02/2011   Lab Results  Component Value Date   CHOLHDL 4 09/30/2012   CHOLHDL 3 03/19/2012   CHOLHDL 3.6 12/02/2011   Lab Results  Component Value Date   LDLDIRECT 75.4 09/30/2012   LDLDIRECT 46.8 03/19/2012   LDLDIRECT 88.3 01/21/2011     Diabetes Home sugar results -- has not checked very often -- in the am usually less than 140, and later in the afternoons is lower - no lower than the 80s  DM diet - eating smaller portions, less appetite , eating out a lot less  Over the holidays was snacking - eating sweets and was very stressed (lot of loss and grief) Exercise - none at all  Symptoms-none  A1C last  Lab Results  Component Value Date   HGBA1C 9.0* 09/30/2012  up from the mid 7s He really thinks this was due to the holidays  No problems with medications  Renal protection-unable to take ace Last eye exam utd    Chemistry      Component Value Date/Time   NA 135 09/30/2012 1150   K 3.7 09/30/2012 1150   CL 96 09/30/2012 1150   CO2 28 09/30/2012 1150   BUN 16 09/30/2012 1150   CREATININE 1.0 09/30/2012 1150      Component Value Date/Time   CALCIUM  9.2 09/30/2012 1150   ALKPHOS 72 09/30/2012 1150   AST 24 09/30/2012 1150   ALT 24 09/30/2012 1150   BILITOT 1.3* 09/30/2012 1150      Smoking status- currently smoking - less than 1/2 ppd  Has looked into vapor cigarettes  As always -thinking about quitting and copd is getting worse  Patient Active Problem List  Diagnosis  . HYPERKALEMIA  . ERECTILE DYSFUNCTION  . HYPERTENSION  . ALLERGIC  RHINITIS  . ASTHMA  . COPD  . KIDNEY STONE  . DERMATITIS, SEBORRHEIC  . HLD (hyperlipidemia)  . Pulmonary nodule, right  . Rotator cuff injury  . Frozen shoulder  . Shoulder pain  . Hyperkalemia  . Dermatitis seborrheica  . Kidney stone  . Allergic rhinitis  . ED (erectile dysfunction)  . Diabetes type 2, uncontrolled  . Asthma  . Tobacco abuse  . CVA (cerebral infarction)  . Grief  . Immunization not carried out because of patient refusal   Past Medical History  Diagnosis Date  . HTN (hypertension)   . HLD (hyperlipidemia)   . Pulmonary nodule, right  lower lobe  . COPD (chronic obstructive pulmonary disease)   . Rotator cuff injury     right  . Frozen shoulder   . Shoulder pain   . Hyperkalemia   . Dermatitis seborrheica   . Other diseases of lung, not elsewhere classified   . Kidney stone   . Allergic rhinitis   . ED (erectile dysfunction)   . Diabetes mellitus type II   . Asthma   . Tobacco abuse    Past Surgical History  Procedure Laterality Date  . Cervical discectomy  1998  . Ett  11/1994  . Tm repair    . Tee without cardioversion  12/03/2011    Procedure: TRANSESOPHAGEAL ECHOCARDIOGRAM (TEE);  Surgeon: Lewayne Bunting, MD;  Location: [facility] ENDOSCOPY;  Service: Cardiovascular;  Laterality: N/A;   History  Substance Use Topics  . Smoking status: Current Every Day Smoker -- 0.20 packs/day    Types: Cigarettes  . Smokeless tobacco: Never Used     Comment: prev smoked 1 1/2 PPD. Currently 5 cigs daily  . Alcohol Use: No     Comment: Quit 40 years ago   Family  History  Problem Relation Age of Onset  . Asthma Sister   . Emphysema Sister    Allergies  Allergen Reactions  . Amoxicillin-Pot Clavulanate Other (See Comments)    Severe stomach pain.   . Quinapril Hcl     REACTION: back pain  . Valsartan     REACTION: back pain  . Varenicline Tartrate     REACTION: AMS, hallucinations, night mares   Current Outpatient Prescriptions on File Prior to Visit  Medication Sig Dispense Refill  . albuterol (PROVENTIL) (2.5 MG/3ML) 0.083% nebulizer solution Take 2.5 mg by nebulization 2 (two) times daily as needed.        [name] amLODipine (NORVASC) 5 MG tablet Take 1 tablet (5 mg total) by mouth daily.  90 tablet  3  . aspirin 325 MG tablet Take 1 tablet (325 mg total) by mouth daily.  30 tablet  0  . atorvastatin (LIPITOR) 10 MG tablet Take 1 tablet (10 mg total) by mouth daily.  90 tablet  3  . Fluticasone-Salmeterol (ADVAIR DISKUS) 250-50 MCG/DOSE AEPB Inhale 1 puff into the lungs every 12 (twelve) hours.  60 each  3  . glipiZIDE (GLIPIZIDE XL) 10 MG 24 hr tablet Take 1 tablet (10 mg total) by mouth daily.  90 tablet  3  . glucose blood (FREESTYLE TEST STRIPS) test strip Check blood sugar twice a day and as needed. 250.00  100 each  6  . ibuprofen (ADVIL,MOTRIN) 200 MG tablet Take 400 mg by mouth every 6 (six) hours as needed. As needed for pain.      [name] insulin glargine (LANTUS SOLOSTAR) 100 UNIT/ML injection Inject 20 Units into the skin every evening. 1800  10 mL  11  . Insulin Pen Needle (PEN NEEDLES 5/16") 30G X 8 MM MISC Inject 1 each into the skin daily as needed.  100 each  11  . Lancets 28G MISC by Does not apply route 2 (two) times daily.        . metFORMIN (GLUCOPHAGE) 1000 MG tablet TAKE ONE (1) TABLET AT BREAKFAST, TAKE ONE-HALF (1/2) TABLET AT LUNCH AND TAKE ONE (1) TABLET IN THE EVENING AS DIRECTED  225 tablet  1  . mometasone (NASONEX) 50 MCG/ACT nasal spray Place 2 sprays into the nose daily.  17 g  11  . vardenafil (LEVITRA) 10 MG tablet  Take  10 mg by mouth daily as needed.        . VENTOLIN HFA 108 (90 BASE) MCG/ACT inhaler USE 2 PUFFS EVERY 4 HOURS AS NEEDED FOR WHEEZE  1 Inhaler  11   No current facility-administered medications on file prior to visit.        Review of Systems Review of Systems  Constitutional: Negative for fever, appetite change, fatigue and unexpected weight change.  Eyes: Negative for pain and visual disturbance.  Respiratory: Negative for cough and pos for sob on exertion (baseline for copd)   Cardiovascular: Negative for cp or palpitations    Gastrointestinal: Negative for nausea, diarrhea and constipation.  Genitourinary: Negative for urgency and frequency. neg for excessive thirst or urination  Skin: Negative for pallor or rash   Neurological: Negative for weakness, light-headedness, numbness and headaches.  Hematological: Negative for adenopathy. Does not bruise/bleed easily.  Psychiatric/Behavioral: Negative for dysphoric mood. The patient is not nervous/anxious.         Objective:   Physical Exam  Constitutional: He appears well-developed and well-nourished. No distress.  obese and well appearing   HENT:  Head: Normocephalic and atraumatic.  Mouth/Throat: Oropharynx is clear and moist.  Eyes: Conjunctivae and EOM are normal. Right eye exhibits no discharge. Left eye exhibits no discharge. No scleral icterus.  Neck: Normal range of motion. Neck supple. No JVD present. Carotid bruit is not present. No thyromegaly present.  Cardiovascular: Normal rate, regular rhythm and normal heart sounds.  Exam reveals no gallop.   Pulmonary/Chest: Effort normal and breath sounds normal. No respiratory distress. He has no wheezes. He has no rales.  Diffusely distant bs  Pt smells stongly of smoke  Abdominal: Soft. Bowel sounds are normal. He exhibits no distension, no abdominal bruit and no mass. There is no tenderness.  Musculoskeletal: He exhibits no edema and no tenderness.  Lymphadenopathy:    He  has no cervical adenopathy.  Neurological: He is alert. He has normal reflexes. No cranial nerve deficit. He exhibits normal muscle tone. Coordination normal.  Skin: Skin is warm and dry. No rash noted. No erythema. No pallor.  Psychiatric: He has a normal mood and affect.          Assessment & Plan:

## 2012-11-22 NOTE — Assessment & Plan Note (Signed)
Disc in detail risks of smoking and possible outcomes including copd, vascular/ heart disease, cancer , respiratory and sinus infections  Pt voices understanding  He is thinking about quitting Understands that his copd will continue to progress unless he quits also

## 2012-11-22 NOTE — Patient Instructions (Addendum)
Your a1c went up to 9 Increase lantus to 30 for 1 week and then to 40 --if low sugars let me know  Check sugar am and 2 hours after evening meal and keep a log of it  In about 1 month- send me some sugar numbers (when you are on the 40 of lantus) Follow up in 3 months with labs prior  Try to loose weight and eat a diabetic diet and add some exercise

## 2012-11-22 NOTE — Assessment & Plan Note (Signed)
bp in fair control at this time  No changes needed  Disc lifstyle change with low sodium diet and exercise  Labs rev 

## 2012-11-22 NOTE — Assessment & Plan Note (Signed)
Worse after the holidays with a1c up to 9  Urged to start checking sugar bid and keep log Will titrate lantus up to 40 u as tolerate and report back with glucose readings F/u 3 mo / lab Disc low glycemic diet/exercise and need for wt loss in detail

## 2013-02-22 ENCOUNTER — Other Ambulatory Visit (INDEPENDENT_AMBULATORY_CARE_PROVIDER_SITE_OTHER): Payer: Medicare Other

## 2013-02-22 DIAGNOSIS — E1165 Type 2 diabetes mellitus with hyperglycemia: Secondary | ICD-10-CM

## 2013-02-22 DIAGNOSIS — I10 Essential (primary) hypertension: Secondary | ICD-10-CM

## 2013-02-22 LAB — COMPREHENSIVE METABOLIC PANEL
ALT: 18 U/L (ref 0–53)
AST: 19 U/L (ref 0–37)
Albumin: 4 g/dL (ref 3.5–5.2)
Alkaline Phosphatase: 57 U/L (ref 39–117)
BUN: 15 mg/dL (ref 6–23)
CO2: 29 mEq/L (ref 19–32)
Calcium: 9.2 mg/dL (ref 8.4–10.5)
Chloride: 100 mEq/L (ref 96–112)
Creatinine, Ser: 1 mg/dL (ref 0.4–1.5)
GFR: 78.83 mL/min (ref >60.00)
Glucose, Bld: 113 mg/dL — ABNORMAL HIGH (ref 70–99)
Potassium: 4.4 mEq/L (ref 3.5–5.1)
Sodium: 139 mEq/L (ref 135–145)
Total Bilirubin: 0.7 mg/dL (ref 0.3–1.2)
Total Protein: 7.3 g/dL (ref 6.0–8.3)

## 2013-02-22 LAB — HEMOGLOBIN A1C: Hgb A1c MFr Bld: 8.7 % — ABNORMAL HIGH (ref 4.6–6.5)

## 2013-02-28 ENCOUNTER — Encounter: Payer: Self-pay | Admitting: Family Medicine

## 2013-02-28 ENCOUNTER — Ambulatory Visit (INDEPENDENT_AMBULATORY_CARE_PROVIDER_SITE_OTHER): Payer: Medicare Other | Admitting: Family Medicine

## 2013-02-28 VITALS — BP 138/88 | HR 88 | Temp 98.1°F | Ht 67.0 in | Wt 194.0 lb

## 2013-02-28 DIAGNOSIS — F17200 Nicotine dependence, unspecified, uncomplicated: Secondary | ICD-10-CM

## 2013-02-28 DIAGNOSIS — E1165 Type 2 diabetes mellitus with hyperglycemia: Secondary | ICD-10-CM

## 2013-02-28 DIAGNOSIS — Z720 Tobacco use: Secondary | ICD-10-CM

## 2013-02-28 DIAGNOSIS — I10 Essential (primary) hypertension: Secondary | ICD-10-CM

## 2013-02-28 NOTE — Assessment & Plan Note (Signed)
Disc in detail risks of smoking and possible outcomes including copd, vascular/ heart disease, cancer , respiratory and sinus infections  Pt voices understanding His copd is worse- limiting exercise at this time

## 2013-02-28 NOTE — Patient Instructions (Addendum)
Make sure to get your annual eye exam  Keep working with diabetic diet and weight loss Also concentrate on quitting smoking - so you can get more exercise also  Follow up in 3 months with labs prior  Continue present medicines

## 2013-02-28 NOTE — Assessment & Plan Note (Signed)
Imp on 2nd check bp in fair control at this time  No changes needed  Disc lifstyle change with low sodium diet and exercise

## 2013-02-28 NOTE — Progress Notes (Signed)
Subjective:    Patient ID: LITTLE WINTON, male    DOB: 1941-07-16, 72 y.o.   MRN: [id number]  HPI Here for f/u of DM  Doing ok overall   Wt is up 5 lb with bmi of 30  Diabetes- has done DM teaching classes in the past  Home sugar results - in the am - very good control - (no sugars lower than 60) and eve 150s or below with some exceptions Different foods do raise sugar quite a bit - especially pasta  Eating salads however- do very well  DM diet - doing better overall - wife is helping a lot with that - just started this recently  Exercise - not much because of copd  Symptoms-no thirst or excessive urination A1C last  Lab Results  Component Value Date   HGBA1C 8.7* 02/22/2013    No problems with lantus  Renal protection- ace allergic  Last eye exam - 3/13  - is due     Smoking- is thinking about quitting altogether Breathing is worse Keeps him from getting exercise  Patient Active Problem List   Diagnosis Date Noted  . Grief 07/05/2012  . Immunization not carried out because of patient refusal 07/05/2012  . CVA (cerebral infarction) 12/01/2011  . HLD (hyperlipidemia)   . Pulmonary nodule, right   . Rotator cuff injury   . Frozen shoulder   . Shoulder pain   . Hyperkalemia   . Dermatitis seborrheica   . Kidney stone   . Allergic rhinitis   . ED (erectile dysfunction)   . Diabetes type 2, uncontrolled   . Asthma   . Tobacco abuse   . COPD 03/29/2010  . HYPERKALEMIA 12/14/2008  . DERMATITIS, SEBORRHEIC 04/14/2008  . KIDNEY STONE 08/30/2007  . ERECTILE DYSFUNCTION 01/05/2007  . HYPERTENSION 01/05/2007   Past Medical History  Diagnosis Date  . HTN (hypertension)   . HLD (hyperlipidemia)   . Pulmonary nodule, right     lower lobe  . COPD (chronic obstructive pulmonary disease)   . Rotator cuff injury     right  . Frozen shoulder   . Shoulder pain   . Hyperkalemia   . Dermatitis seborrheica   . Other diseases of lung, not elsewhere classified   .  Kidney stone   . Allergic rhinitis   . ED (erectile dysfunction)   . Diabetes mellitus type II   . Asthma   . Tobacco abuse    Past Surgical History  Procedure Laterality Date  . Cervical discectomy  1998  . Ett  11/1994  . Tm repair    . Tee without cardioversion  12/03/2011    Procedure: TRANSESOPHAGEAL ECHOCARDIOGRAM (TEE);  Surgeon: Lewayne Bunting, MD;  Location[facility] ENDOSCOPY;  Service: Cardiovascular;  Laterality: N/A;   History  Substance Use Topics  . Smoking status: Current Every Day Smoker -- 0.20 packs/day    Types: Cigarettes  . Smokeless tobacco: Never Used     Comment: prev smoked 1 1/2 PPD. Currently 5 cigs daily  . Alcohol Use: No     Comment: Quit 40 years ago   Family History  Problem Relation Age of Onset  . Asthma Sister   . Emphysema Sister    Allergies  Allergen Reactions  . Amoxicillin-Pot Clavulanate Other (See Comments)    Severe stomach pain.   . Quinapril Hcl     REACTION: back pain  . Valsartan     REACTION: back pain  . Varenicline Tartrate  REACTION: AMS, hallucinations, night mares   Current Outpatient Prescriptions on File Prior to Visit  Medication Sig Dispense Refill  . albuterol (PROVENTIL) (2.5 MG/3ML) 0.083% nebulizer solution Take 2.5 mg by nebulization 2 (two) times daily as needed.        [name] albuterol (VENTOLIN HFA) 108 (90 BASE) MCG/ACT inhaler Inhale 2 puffs into the lungs every 6 (six) hours as needed for wheezing.  1 Inhaler  11  . amLODipine (NORVASC) 5 MG tablet Take 1 tablet (5 mg total) by mouth daily.  90 tablet  3  . atorvastatin (LIPITOR) 10 MG tablet Take 1 tablet (10 mg total) by mouth daily.  90 tablet  3  . Fluticasone-Salmeterol (ADVAIR DISKUS) 250-50 MCG/DOSE AEPB Inhale 1 puff into the lungs every 12 (twelve) hours.  60 each  3  . glipiZIDE (GLIPIZIDE XL) 10 MG 24 hr tablet Take 1 tablet (10 mg total) by mouth daily.  90 tablet  3  . glucose blood (FREESTYLE TEST STRIPS) test strip Check blood sugar twice a day  and as needed. 250.00  100 each  6  . ibuprofen (ADVIL,MOTRIN) 200 MG tablet Take 400 mg by mouth every 6 (six) hours as needed. As needed for pain.      [name] insulin glargine (LANTUS SOLOSTAR) 100 UNIT/ML injection Inject 40 Units into the skin every evening. 1800  10 mL  11  . Insulin Pen Needle (PEN NEEDLES 5/16") 30G X 8 MM MISC Inject 1 each into the skin daily as needed.  100 each  11  . Lancets 28G MISC by Does not apply route 2 (two) times daily.        . metFORMIN (GLUCOPHAGE) 1000 MG tablet Take one tablet in am by mouth, 1/2 tablet midday, and 1 tablet in evening  225 tablet  3  . mometasone (NASONEX) 50 MCG/ACT nasal spray Place 2 sprays into the nose daily.  17 g  11  . vardenafil (LEVITRA) 10 MG tablet Take 10 mg by mouth daily as needed.         No current facility-administered medications on file prior to visit.      Review of Systems Review of Systems  Constitutional: Negative for fever, appetite change, fatigue and unexpected weight change.  Eyes: Negative for pain and visual disturbance.  Respiratory: Negative for cough and pos for sob on exertion/ pos for chest congestion in the ams   Cardiovascular: Negative for cp or palpitations    Gastrointestinal: Negative for nausea, diarrhea and constipation.  Genitourinary: Negative for urgency and frequency.  Skin: Negative for pallor or rash   Neurological: Negative for weakness, light-headedness, numbness and headaches.  Hematological: Negative for adenopathy. Does not bruise/bleed easily.  Psychiatric/Behavioral: Negative for dysphoric mood. The patient is not nervous/anxious.         Objective:   Physical Exam  Constitutional: He appears well-developed and well-nourished. No distress.  obese and well appearing   HENT:  Head: Normocephalic and atraumatic.  Mouth/Throat: Oropharynx is clear and moist.  Eyes: Conjunctivae and EOM are normal. Pupils are equal, round, and reactive to light. Right eye exhibits no discharge.  Left eye exhibits no discharge. No scleral icterus.  Neck: Normal range of motion. Neck supple. No JVD present. No thyromegaly present.  Cardiovascular: Normal rate and regular rhythm.   Pulmonary/Chest: Effort normal and breath sounds normal. No respiratory distress. He has no rales.  Diffusely distant bs occ end exp wheeze  Abdominal: Soft. Bowel sounds are normal. He exhibits  no distension, no abdominal bruit and no mass. There is no tenderness.  Musculoskeletal: He exhibits no edema and no tenderness.  Lymphadenopathy:    He has no cervical adenopathy.  Neurological: He is alert. He has normal reflexes. No cranial nerve deficit. He exhibits normal muscle tone. Coordination normal.  Skin: Skin is warm and dry. No rash noted. No erythema. No pallor.  Psychiatric: He has a normal mood and affect.          Assessment & Plan:

## 2013-02-28 NOTE — Assessment & Plan Note (Signed)
a1c is improved - now working on diet and sugars are much imp (bid) Did ask him to check sugars at some odd times to see when it peaks Lab and then f/u 3 mo If no imp A1c -ref to endo

## 2013-04-07 ENCOUNTER — Other Ambulatory Visit: Payer: Self-pay

## 2013-04-27 ENCOUNTER — Other Ambulatory Visit: Payer: Self-pay | Admitting: *Deleted

## 2013-04-27 MED ORDER — GLIPIZIDE ER 10 MG PO TB24: 10.0000 mg | ORAL_TABLET | Freq: Every day | ORAL | Status: DC

## 2013-05-16 MED ORDER — GLIPIZIDE ER 10 MG PO TB24: 10.0000 mg | ORAL_TABLET | Freq: Every day | ORAL | Status: DC

## 2013-05-16 NOTE — Telephone Encounter (Signed)
Pt said [facility] did not get glipizide refill on 04/27/13; I spoke with Lauren pharmacist at [facility];Medication phoned to [facility] as instructed. Pt has been out of med for several days. I offered to call in to local pharmacy. Pt said he could not afford it. Spoke with Heidi at [facility] cost to pt is $ 3.45 for glipizide XL. Pt said he would pick up at [facility] while waiting on mail order med.

## 2013-05-16 NOTE — Addendum Note (Signed)
Addended by: Patience Musca on: 05/16/2013 02:33 PM   Modules accepted: Orders

## 2013-05-24 ENCOUNTER — Other Ambulatory Visit (INDEPENDENT_AMBULATORY_CARE_PROVIDER_SITE_OTHER): Payer: Medicare Other

## 2013-05-24 DIAGNOSIS — E785 Hyperlipidemia, unspecified: Secondary | ICD-10-CM

## 2013-05-24 DIAGNOSIS — I10 Essential (primary) hypertension: Secondary | ICD-10-CM

## 2013-05-24 DIAGNOSIS — E1165 Type 2 diabetes mellitus with hyperglycemia: Secondary | ICD-10-CM

## 2013-05-24 DIAGNOSIS — E875 Hyperkalemia: Secondary | ICD-10-CM

## 2013-05-24 LAB — COMPREHENSIVE METABOLIC PANEL
ALT: 22 U/L (ref 0–53)
AST: 20 U/L (ref 0–37)
Albumin: 4.2 g/dL (ref 3.5–5.2)
Alkaline Phosphatase: 64 U/L (ref 39–117)
BUN: 12 mg/dL (ref 6–23)
CO2: 31 mEq/L (ref 19–32)
Calcium: 9.2 mg/dL (ref 8.4–10.5)
Chloride: 99 mEq/L (ref 96–112)
Creatinine, Ser: 1 mg/dL (ref 0.4–1.5)
GFR: 81.63 mL/min (ref >60.00)
Glucose, Bld: 157 mg/dL — ABNORMAL HIGH (ref 70–99)
Potassium: 4.2 mEq/L (ref 3.5–5.1)
Sodium: 137 mEq/L (ref 135–145)
Total Bilirubin: 0.9 mg/dL (ref 0.3–1.2)
Total Protein: 7.5 g/dL (ref 6.0–8.3)

## 2013-05-24 LAB — LIPID PANEL
Cholesterol: 129 mg/dL (ref 0–200)
HDL: 37.8 mg/dL — ABNORMAL LOW (ref >39.00)
LDL Cholesterol: 53 mg/dL (ref 0–99)
Total CHOL/HDL Ratio: 3
Triglycerides: 191 mg/dL — ABNORMAL HIGH (ref 0.0–149.0)
VLDL: 38.2 mg/dL (ref 0.0–40.0)

## 2013-05-24 LAB — HEMOGLOBIN A1C: Hgb A1c MFr Bld: 7.7 % — ABNORMAL HIGH (ref 4.6–6.5)

## 2013-05-31 ENCOUNTER — Ambulatory Visit (INDEPENDENT_AMBULATORY_CARE_PROVIDER_SITE_OTHER): Payer: Medicare Other | Admitting: Family Medicine

## 2013-05-31 ENCOUNTER — Encounter: Payer: Self-pay | Admitting: Family Medicine

## 2013-05-31 VITALS — BP 122/74 | HR 109 | Temp 98.5°F | Ht 67.0 in | Wt 190.8 lb

## 2013-05-31 DIAGNOSIS — I10 Essential (primary) hypertension: Secondary | ICD-10-CM

## 2013-05-31 DIAGNOSIS — E785 Hyperlipidemia, unspecified: Secondary | ICD-10-CM

## 2013-05-31 DIAGNOSIS — Z720 Tobacco use: Secondary | ICD-10-CM

## 2013-05-31 DIAGNOSIS — E1165 Type 2 diabetes mellitus with hyperglycemia: Secondary | ICD-10-CM

## 2013-05-31 DIAGNOSIS — F17200 Nicotine dependence, unspecified, uncomplicated: Secondary | ICD-10-CM

## 2013-05-31 MED ORDER — GLIPIZIDE ER 10 MG PO TB24: 10.0000 mg | ORAL_TABLET | Freq: Every day | ORAL | Status: DC

## 2013-05-31 NOTE — Progress Notes (Signed)
Subjective:    Patient ID: Jonathan [name], male    DOB: May 22, 1941, 72 y.o.   MRN: [id number]  HPI Here for f/u of chronic medical problems   bp is stable today  No cp or palpitations or headaches or edema  No side effects to medicines  BP Readings from Last 3 Encounters:  05/31/13 122/74  02/28/13 138/88  11/22/12 126/74     Wt is down 4 lb with bmi of 29 He has been working on diet  Has cut out fried foods , is also off bread completely. Changed to whole grain item  No red meat    Smoking status  Hyperlipidemia  Lab Results  Component Value Date   CHOL 129 05/24/2013   CHOL 131 09/30/2012   CHOL 99 03/19/2012   Lab Results  Component Value Date   HDL 37.80* 05/24/2013   HDL 37.40* 09/30/2012   HDL 11.91* 03/19/2012   Lab Results  Component Value Date   LDLCALC 53 05/24/2013   LDLCALC UNABLE TO CALCULATE IF TRIGLYCERIDE OVER 400 mg/dL 01/03/8294   LDLCALC 72 03/19/3085   Lab Results  Component Value Date   TRIG 191.0* 05/24/2013   TRIG 201.0* 09/30/2012   TRIG 240.0* 03/19/2012   Lab Results  Component Value Date   CHOLHDL 3 05/24/2013   CHOLHDL 4 09/30/2012   CHOLHDL 3 03/19/2012   Lab Results  Component Value Date   LDLDIRECT 75.4 09/30/2012   LDLDIRECT 46.8 03/19/2012   LDLDIRECT 88.3 01/21/2011   on lipitor and diet   Diabetes Home sugar results - usually 87-121  (a few highs after eating out) - at different times of day No low sugars at all  DM diet - is sticking to diabetic diet guidelines much more  Exercise -= limited by copd / does move around in his home  Symptoms A1C last  Lab Results  Component Value Date   HGBA1C 7.7* 05/24/2013  this is down from 8.7  No problems with medications  Renal protection- is ace allergic  Last eye exam -has not had one yet - really needs to schedule it   Declines vaccines -including flu     Patient Active Problem List   Diagnosis Date Noted  . Grief 07/05/2012  . Immunization not carried out because of patient  refusal 07/05/2012  . CVA (cerebral infarction) 12/01/2011  . HLD (hyperlipidemia)   . Pulmonary nodule, right   . Rotator cuff injury   . Frozen shoulder   . Shoulder pain   . Hyperkalemia   . Dermatitis seborrheica   . Kidney stone   . Allergic rhinitis   . ED (erectile dysfunction)   . Diabetes type 2, uncontrolled   . Asthma   . Tobacco abuse   . COPD 03/29/2010  . HYPERKALEMIA 12/14/2008  . DERMATITIS, SEBORRHEIC 04/14/2008  . KIDNEY STONE 08/30/2007  . ERECTILE DYSFUNCTION 01/05/2007  . HYPERTENSION 01/05/2007   Past Medical History  Diagnosis Date  . HTN (hypertension)   . HLD (hyperlipidemia)   . Pulmonary nodule, right     lower lobe  . COPD (chronic obstructive pulmonary disease)   . Rotator cuff injury     right  . Frozen shoulder   . Shoulder pain   . Hyperkalemia   . Dermatitis seborrheica   . Other diseases of lung, not elsewhere classified   . Kidney stone   . Allergic rhinitis   . ED (erectile dysfunction)   . Diabetes mellitus type II   .  Asthma   . Tobacco abuse    Past Surgical History  Procedure Laterality Date  . Cervical discectomy  1998  . Ett  11/1994  . Tm repair    . Tee without cardioversion  12/03/2011    Procedure: TRANSESOPHAGEAL ECHOCARDIOGRAM (TEE);  Surgeon: Lewayne Bunting, MD;  Location: [facility] ENDOSCOPY;  Service: Cardiovascular;  Laterality: N/A;   History  Substance Use Topics  . Smoking status: Current Every Day Smoker -- 0.20 packs/day    Types: Cigarettes  . Smokeless tobacco: Never Used     Comment: prev smoked 1 1/2 PPD. Currently 5 cigs daily  . Alcohol Use: No     Comment: Quit 40 years ago   Family History  Problem Relation Age of Onset  . Asthma Sister   . Emphysema Sister    Allergies  Allergen Reactions  . Amoxicillin-Pot Clavulanate Other (See Comments)    Severe stomach pain.   . Quinapril Hcl     REACTION: back pain  . Valsartan     REACTION: back pain  . Varenicline Tartrate     REACTION: AMS,  hallucinations, night mares   Current Outpatient Prescriptions on File Prior to Visit  Medication Sig Dispense Refill  . albuterol (PROVENTIL) (2.5 MG/3ML) 0.083% nebulizer solution Take 2.5 mg by nebulization 2 (two) times daily as needed.        [name] albuterol (VENTOLIN HFA) 108 (90 BASE) MCG/ACT inhaler Inhale 2 puffs into the lungs every 6 (six) hours as needed for wheezing.  1 Inhaler  11  . amLODipine (NORVASC) 5 MG tablet Take 1 tablet (5 mg total) by mouth daily.  90 tablet  3  . atorvastatin (LIPITOR) 10 MG tablet Take 1 tablet (10 mg total) by mouth daily.  90 tablet  3  . Fluticasone-Salmeterol (ADVAIR DISKUS) 250-50 MCG/DOSE AEPB Inhale 1 puff into the lungs every 12 (twelve) hours.  60 each  3  . glipiZIDE (GLIPIZIDE XL) 10 MG 24 hr tablet Take 1 tablet (10 mg total) by mouth daily.  90 tablet  1  . glucose blood (FREESTYLE TEST STRIPS) test strip Check blood sugar twice a day and as needed. 250.00  100 each  6  . ibuprofen (ADVIL,MOTRIN) 200 MG tablet Take 400 mg by mouth every 6 (six) hours as needed. As needed for pain.      [name] insulin glargine (LANTUS SOLOSTAR) 100 UNIT/ML injection Inject 40 Units into the skin every evening. 1800  10 mL  11  . Insulin Pen Needle (PEN NEEDLES 5/16") 30G X 8 MM MISC Inject 1 each into the skin daily as needed.  100 each  11  . Lancets 28G MISC by Does not apply route 2 (two) times daily.        . metFORMIN (GLUCOPHAGE) 1000 MG tablet Take one tablet in am by mouth, 1/2 tablet midday, and 1 tablet in evening  225 tablet  3  . mometasone (NASONEX) 50 MCG/ACT nasal spray Place 2 sprays into the nose daily.  17 g  11  . vardenafil (LEVITRA) 10 MG tablet Take 10 mg by mouth daily as needed.         No current facility-administered medications on file prior to visit.    Review of Systems Review of Systems  Constitutional: Negative for fever, appetite change, fatigue and unexpected weight change.  Eyes: Negative for pain and visual disturbance.   Respiratory: Negative for cough and pos for baseline sob from copd with limited endurance Cardiovascular: Negative  for cp or palpitations    Gastrointestinal: Negative for nausea, diarrhea and constipation.  Genitourinary: Negative for urgency and frequency.  Skin: Negative for pallor or rash   Neurological: Negative for weakness, light-headedness, numbness and headaches.  Hematological: Negative for adenopathy. Does not bruise/bleed easily.  Psychiatric/Behavioral: Negative for dysphoric mood. The patient is not nervous/anxious.         Objective:   Physical Exam  Constitutional: He appears well-nourished. No distress.  HENT:  Head: Normocephalic and atraumatic.  Mouth/Throat: Oropharynx is clear and moist.  Eyes: Conjunctivae and EOM are normal. Pupils are equal, round, and reactive to light. Right eye exhibits no discharge. Left eye exhibits no discharge. No scleral icterus.  Neck: Normal range of motion. Neck supple. No JVD present. No thyromegaly present.  Cardiovascular: Normal rate, regular rhythm and normal heart sounds.   At rest pulse is in high 80s   Pulmonary/Chest: Effort normal and breath sounds normal. No respiratory distress. He has no wheezes. He has no rales.  Diffusely distant bs   Musculoskeletal: He exhibits no edema.  Lymphadenopathy:    He has no cervical adenopathy.  Neurological: He is alert. He has normal reflexes. No cranial nerve deficit. He exhibits normal muscle tone. Coordination normal.  Skin: Skin is warm and dry. No rash noted. No erythema. No pallor.  Psychiatric: He has a normal mood and affect.          Assessment & Plan:

## 2013-05-31 NOTE — Assessment & Plan Note (Signed)
Disc in detail risks of smoking and possible outcomes including copd, vascular/ heart disease, cancer , respiratory and sinus infections  Pt voices understanding Commended on cutting down  Pt has symptomatic copd  He states he is not ready to quit

## 2013-05-31 NOTE — Patient Instructions (Addendum)
Stick with a diabetic diet  Keep thinking about quitting smoking  Get a flu shot if you change your mind - it is very important  Schedule follow up in 3 months with non fasting labs prior

## 2013-05-31 NOTE — Assessment & Plan Note (Signed)
bp in fair control at this time  No changes needed  Disc lifstyle change with low sodium diet and exercise  Labs reviewed  

## 2013-05-31 NOTE — Assessment & Plan Note (Signed)
On statin and diet Disc goals for lipids and reasons to control them Rev labs with pt Rev low sat fat diet in detail  

## 2013-05-31 NOTE — Assessment & Plan Note (Signed)
Improved with A1c down 1 pt on current lantus dose (no hypoglycemia) and much improved diet Exercise is limited due to copd Urged to quit smoking Enc to make yearly eye exam Pt declined flu vaccine (as he declines all vaccines) - though understands risks and was counseled extensively on this  Will keep working on lifestyle habits and f/u with labs prior in 3 mo

## 2013-06-23 ENCOUNTER — Telehealth: Payer: Self-pay

## 2013-06-23 NOTE — Telephone Encounter (Signed)
Cecila pharmacist with [facility] calling on behalf of [employer] left v/m checking on status of fax sent on 06/22/13; was a recommendation to start pt on ace inhibitor or ARB therapy; pt dx with diabetes and hypertension. Cecila request call back if did not receive fax.

## 2013-06-23 NOTE — Telephone Encounter (Signed)
Left voicemail on Jonathan [name]'s phone letting her know Dr. Royden Purl comments

## 2013-06-23 NOTE — Telephone Encounter (Signed)
He is ace allergic  (he can have neither) -- tell them that I am going to take care of the patient and do what I feel best-- no need for faxes or calls please

## 2013-08-23 ENCOUNTER — Other Ambulatory Visit: Payer: Medicare Other

## 2013-08-30 ENCOUNTER — Ambulatory Visit: Payer: Medicare Other | Admitting: Family Medicine

## 2013-09-27 ENCOUNTER — Other Ambulatory Visit: Payer: Self-pay | Admitting: Family Medicine

## 2013-09-28 ENCOUNTER — Other Ambulatory Visit: Payer: Self-pay

## 2013-09-28 MED ORDER — GLIPIZIDE ER 10 MG PO TB24: 10.0000 mg | ORAL_TABLET | Freq: Every day | ORAL | Status: DC

## 2013-10-06 ENCOUNTER — Telehealth: Payer: Self-pay | Admitting: *Deleted

## 2013-10-06 NOTE — Telephone Encounter (Signed)
Received prior auth request for Metformin  as of Jan 1, insurance will only cover a max of 2 tabs daily, without a prior auth/ quanitity limit override.  Auth paperwork obtained and placed in your inbox.

## 2013-10-07 NOTE — Telephone Encounter (Signed)
Done and in IN box 

## 2013-10-10 NOTE — Telephone Encounter (Signed)
Approval form received by insurance company. Will send to provider for signature, then to be scanned into patients medical record. Pharmacy notified via fax.  

## 2013-10-17 ENCOUNTER — Encounter: Payer: Self-pay | Admitting: Gastroenterology

## 2013-11-23 ENCOUNTER — Inpatient Hospital Stay (HOSPITAL_COMMUNITY)
Admission: EM | Admit: 2013-11-23 | Discharge: 2013-11-26 | DRG: 192 | Disposition: A | Discharge status: Home / Self Care | Payer: Medicare Other | Attending: Internal Medicine | Admitting: Internal Medicine

## 2013-11-23 ENCOUNTER — Encounter: Payer: Self-pay | Admitting: Family Medicine

## 2013-11-23 ENCOUNTER — Emergency Department (HOSPITAL_COMMUNITY): Payer: Medicare Other

## 2013-11-23 ENCOUNTER — Encounter (HOSPITAL_COMMUNITY): Payer: Self-pay | Admitting: Emergency Medicine

## 2013-11-23 ENCOUNTER — Ambulatory Visit (INDEPENDENT_AMBULATORY_CARE_PROVIDER_SITE_OTHER): Payer: Medicare Other | Admitting: Family Medicine

## 2013-11-23 VITALS — BP 118/64 | HR 108 | Temp 99.0°F | Ht 67.0 in

## 2013-11-23 DIAGNOSIS — J441 Chronic obstructive pulmonary disease with (acute) exacerbation: Principal | ICD-10-CM

## 2013-11-23 DIAGNOSIS — Z720 Tobacco use: Secondary | ICD-10-CM

## 2013-11-23 DIAGNOSIS — Z79899 Other long term (current) drug therapy: Secondary | ICD-10-CM

## 2013-11-23 DIAGNOSIS — S46009A Unspecified injury of muscle(s) and tendon(s) of the rotator cuff of unspecified shoulder, initial encounter: Secondary | ICD-10-CM

## 2013-11-23 DIAGNOSIS — IMO0001 Reserved for inherently not codable concepts without codable children: Secondary | ICD-10-CM

## 2013-11-23 DIAGNOSIS — N200 Calculus of kidney: Secondary | ICD-10-CM

## 2013-11-23 DIAGNOSIS — E785 Hyperlipidemia, unspecified: Secondary | ICD-10-CM

## 2013-11-23 DIAGNOSIS — J440 Chronic obstructive pulmonary disease with acute lower respiratory infection: Secondary | ICD-10-CM | POA: Insufficient documentation

## 2013-11-23 DIAGNOSIS — J4489 Other specified chronic obstructive pulmonary disease: Secondary | ICD-10-CM

## 2013-11-23 DIAGNOSIS — N209 Urinary calculus, unspecified: Secondary | ICD-10-CM

## 2013-11-23 DIAGNOSIS — M7500 Adhesive capsulitis of unspecified shoulder: Secondary | ICD-10-CM

## 2013-11-23 DIAGNOSIS — F17200 Nicotine dependence, unspecified, uncomplicated: Secondary | ICD-10-CM | POA: Diagnosis present

## 2013-11-23 DIAGNOSIS — R911 Solitary pulmonary nodule: Secondary | ICD-10-CM

## 2013-11-23 DIAGNOSIS — I10 Essential (primary) hypertension: Secondary | ICD-10-CM

## 2013-11-23 DIAGNOSIS — I639 Cerebral infarction, unspecified: Secondary | ICD-10-CM

## 2013-11-23 DIAGNOSIS — F4321 Adjustment disorder with depressed mood: Secondary | ICD-10-CM

## 2013-11-23 DIAGNOSIS — IMO0002 Reserved for concepts with insufficient information to code with codable children: Secondary | ICD-10-CM | POA: Diagnosis present

## 2013-11-23 DIAGNOSIS — L219 Seborrheic dermatitis, unspecified: Secondary | ICD-10-CM

## 2013-11-23 DIAGNOSIS — N529 Male erectile dysfunction, unspecified: Secondary | ICD-10-CM

## 2013-11-23 DIAGNOSIS — M25519 Pain in unspecified shoulder: Secondary | ICD-10-CM

## 2013-11-23 DIAGNOSIS — Z8673 Personal history of transient ischemic attack (TIA), and cerebral infarction without residual deficits: Secondary | ICD-10-CM

## 2013-11-23 DIAGNOSIS — Z2821 Immunization not carried out because of patient refusal: Secondary | ICD-10-CM

## 2013-11-23 DIAGNOSIS — J209 Acute bronchitis, unspecified: Secondary | ICD-10-CM

## 2013-11-23 DIAGNOSIS — E875 Hyperkalemia: Secondary | ICD-10-CM

## 2013-11-23 DIAGNOSIS — J449 Chronic obstructive pulmonary disease, unspecified: Secondary | ICD-10-CM

## 2013-11-23 DIAGNOSIS — Z794 Long term (current) use of insulin: Secondary | ICD-10-CM

## 2013-11-23 DIAGNOSIS — R509 Fever, unspecified: Secondary | ICD-10-CM

## 2013-11-23 DIAGNOSIS — E1165 Type 2 diabetes mellitus with hyperglycemia: Secondary | ICD-10-CM | POA: Diagnosis present

## 2013-11-23 DIAGNOSIS — F528 Other sexual dysfunction not due to a substance or known physiological condition: Secondary | ICD-10-CM

## 2013-11-23 LAB — CBC
HCT: 51.4 % (ref 39.0–52.0)
Hemoglobin: 18 g/dL — ABNORMAL HIGH (ref 13.0–17.0)
MCH: 31 pg (ref 26.0–34.0)
MCHC: 35 g/dL (ref 30.0–36.0)
MCV: 88.5 fL (ref 78.0–100.0)
Platelets: 241 10*3/uL (ref 150–400)
RBC: 5.81 MIL/uL (ref 4.22–5.81)
RDW: 13.6 % (ref 11.5–15.5)
WBC: 12.8 10*3/uL — ABNORMAL HIGH (ref 4.0–10.5)

## 2013-11-23 LAB — INFLUENZA PANEL BY PCR (TYPE A & B)
H1N1 flu by pcr: NOT DETECTED
Influenza A By PCR: NEGATIVE
Influenza B By PCR: NEGATIVE

## 2013-11-23 LAB — CBG MONITORING, ED: Glucose-Capillary: 353 mg/dL — ABNORMAL HIGH (ref 70–99)

## 2013-11-23 LAB — URINE MICROSCOPIC-ADD ON

## 2013-11-23 LAB — URINALYSIS, ROUTINE W REFLEX MICROSCOPIC
Glucose, UA: >1000 mg/dL — AB
Hgb urine dipstick: NEGATIVE
Ketones, ur: 40 mg/dL — AB
Leukocytes, UA: NEGATIVE
Nitrite: NEGATIVE
Protein, ur: 30 mg/dL — AB
Specific Gravity, Urine: 1.034 — ABNORMAL HIGH (ref 1.005–1.030)
Urobilinogen, UA: 0.2 mg/dL (ref 0.0–1.0)
pH: 5.5 (ref 5.0–8.0)

## 2013-11-23 LAB — BASIC METABOLIC PANEL
BUN: 17 mg/dL (ref 6–23)
CO2: 26 mEq/L (ref 19–32)
Calcium: 9.7 mg/dL (ref 8.4–10.5)
Chloride: 94 mEq/L — ABNORMAL LOW (ref 96–112)
Creatinine, Ser: 1 mg/dL (ref 0.50–1.35)
GFR calc Af Amer: 84 mL/min — ABNORMAL LOW (ref >90)
GFR calc non Af Amer: 73 mL/min — ABNORMAL LOW (ref >90)
Glucose, Bld: 237 mg/dL — ABNORMAL HIGH (ref 70–99)
Potassium: 5 mEq/L (ref 3.7–5.3)
Sodium: 136 mEq/L — ABNORMAL LOW (ref 137–147)

## 2013-11-23 LAB — GLUCOSE, CAPILLARY: Glucose-Capillary: 278 mg/dL — ABNORMAL HIGH (ref 70–99)

## 2013-11-23 LAB — PRO B NATRIURETIC PEPTIDE: Pro B Natriuretic peptide (BNP): 82 pg/mL (ref 0–125)

## 2013-11-23 LAB — I-STAT TROPONIN, ED: Troponin i, poc: 0 ng/mL (ref 0.00–0.08)

## 2013-11-23 LAB — POCT INFLUENZA A/B
Influenza A, POC: NEGATIVE
Influenza B, POC: NEGATIVE

## 2013-11-23 MED ORDER — IPRATROPIUM BROMIDE 0.02 % IN SOLN: 0.5000 mg | Freq: Four times a day (QID) | RESPIRATORY_TRACT | Status: DC

## 2013-11-23 MED ORDER — ONDANSETRON HCL 4 MG/2ML IJ SOLN: 4.0000 mg | Freq: Four times a day (QID) | INTRAMUSCULAR | Status: DC | PRN

## 2013-11-23 MED ORDER — INSULIN GLARGINE 100 UNITS/ML SOLOSTAR PEN: 30.0000 [IU] | PEN_INJECTOR | Freq: Every day | SUBCUTANEOUS | Status: DC

## 2013-11-23 MED ORDER — ALBUTEROL (5 MG/ML) CONTINUOUS INHALATION SOLN
10.0000 mg/h | INHALATION_SOLUTION | Freq: Once | RESPIRATORY_TRACT | Status: AC
  Administered 2013-11-23: 10 mg/h via RESPIRATORY_TRACT
  Filled 2013-11-23: qty 20

## 2013-11-23 MED ORDER — METHYLPREDNISOLONE SODIUM SUCC 125 MG IJ SOLR
80.0000 mg | Freq: Four times a day (QID) | INTRAMUSCULAR | Status: DC
  Administered 2013-11-24 (×2): 80 mg via INTRAVENOUS
  Filled 2013-11-23 (×6): qty 1.28

## 2013-11-23 MED ORDER — INSULIN GLARGINE 100 UNIT/ML ~~LOC~~ SOLN
30.0000 [IU] | Freq: Every day | SUBCUTANEOUS | Status: DC
  Administered 2013-11-23: 30 [IU] via SUBCUTANEOUS
  Filled 2013-11-23 (×2): qty 0.3

## 2013-11-23 MED ORDER — ENOXAPARIN SODIUM 40 MG/0.4ML ~~LOC~~ SOLN
40.0000 mg | SUBCUTANEOUS | Status: DC
  Administered 2013-11-23 – 2013-11-25 (×3): 40 mg via SUBCUTANEOUS
  Filled 2013-11-23 (×4): qty 0.4

## 2013-11-23 MED ORDER — ACETAMINOPHEN 325 MG PO TABS
650.0000 mg | ORAL_TABLET | Freq: Four times a day (QID) | ORAL | Status: DC | PRN
  Administered 2013-11-25: 650 mg via ORAL
  Filled 2013-11-23: qty 2

## 2013-11-23 MED ORDER — ALBUTEROL SULFATE (2.5 MG/3ML) 0.083% IN NEBU: 2.5000 mg | INHALATION_SOLUTION | RESPIRATORY_TRACT | Status: DC | PRN

## 2013-11-23 MED ORDER — AMLODIPINE BESYLATE 5 MG PO TABS
5.0000 mg | ORAL_TABLET | Freq: Every day | ORAL | Status: DC
  Administered 2013-11-23 – 2013-11-26 (×4): 5 mg via ORAL
  Filled 2013-11-23 (×4): qty 1

## 2013-11-23 MED ORDER — ATORVASTATIN CALCIUM 10 MG PO TABS
10.0000 mg | ORAL_TABLET | Freq: Every day | ORAL | Status: DC
  Administered 2013-11-23 – 2013-11-26 (×4): 10 mg via ORAL
  Filled 2013-11-23 (×4): qty 1

## 2013-11-23 MED ORDER — ASPIRIN 325 MG PO TABS
325.0000 mg | ORAL_TABLET | Freq: Every day | ORAL | Status: DC
  Administered 2013-11-24 – 2013-11-26 (×3): 325 mg via ORAL
  Filled 2013-11-23 (×3): qty 1

## 2013-11-23 MED ORDER — IPRATROPIUM-ALBUTEROL 0.5-2.5 (3) MG/3ML IN SOLN
3.0000 mL | Freq: Four times a day (QID) | RESPIRATORY_TRACT | Status: DC
  Administered 2013-11-24 – 2013-11-26 (×10): 3 mL via RESPIRATORY_TRACT
  Filled 2013-11-23 (×10): qty 3

## 2013-11-23 MED ORDER — GUAIFENESIN ER 600 MG PO TB12
600.0000 mg | ORAL_TABLET | Freq: Two times a day (BID) | ORAL | Status: DC
  Administered 2013-11-23 – 2013-11-26 (×6): 600 mg via ORAL
  Filled 2013-11-23 (×9): qty 1

## 2013-11-23 MED ORDER — METHYLPREDNISOLONE SODIUM SUCC 125 MG IJ SOLR
125.0000 mg | Freq: Once | INTRAMUSCULAR | Status: AC
  Administered 2013-11-23: 125 mg via INTRAVENOUS
  Filled 2013-11-23: qty 2

## 2013-11-23 MED ORDER — INSULIN ASPART 100 UNIT/ML ~~LOC~~ SOLN
0.0000 [IU] | Freq: Three times a day (TID) | SUBCUTANEOUS | Status: DC
  Administered 2013-11-23: 15 [IU] via SUBCUTANEOUS
  Administered 2013-11-24 (×3): 8 [IU] via SUBCUTANEOUS
  Administered 2013-11-25: 11 [IU] via SUBCUTANEOUS
  Administered 2013-11-25: 8 [IU] via SUBCUTANEOUS
  Administered 2013-11-25 – 2013-11-26 (×2): 11 [IU] via SUBCUTANEOUS
  Administered 2013-11-26: 8 [IU] via SUBCUTANEOUS
  Filled 2013-11-23: qty 1

## 2013-11-23 MED ORDER — IPRATROPIUM BROMIDE 0.02 % IN SOLN
0.5000 mg | Freq: Once | RESPIRATORY_TRACT | Status: AC
  Administered 2013-11-23: 0.5 mg via RESPIRATORY_TRACT
  Filled 2013-11-23: qty 2.5

## 2013-11-23 MED ORDER — LEVOFLOXACIN IN D5W 500 MG/100ML IV SOLN
500.0000 mg | INTRAVENOUS | Status: DC
  Administered 2013-11-23 – 2013-11-25 (×3): 500 mg via INTRAVENOUS
  Filled 2013-11-23 (×4): qty 100

## 2013-11-23 MED ORDER — ALBUTEROL SULFATE (2.5 MG/3ML) 0.083% IN NEBU: 2.5000 mg | INHALATION_SOLUTION | Freq: Four times a day (QID) | RESPIRATORY_TRACT | Status: DC

## 2013-11-23 NOTE — Progress Notes (Signed)
Received report from Brittany.

## 2013-11-23 NOTE — ED Notes (Signed)
Admitting MD at bedside.

## 2013-11-23 NOTE — ED Notes (Signed)
Contacted pharmacy about pt's medications.
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
## 2013-11-23 NOTE — H&P (Addendum)
[facility] History and Physical  Jonathan [name] [id number]:[id number] DOB: 1940-12-07 DOA: 11/23/2013  Referring 57 PCP: Loura Pardon, MD   Chief Complaint: weakness, cough, wheezing  HPI: Jonathan [name] is a 73 y.o. male with PMH of COPD, Ongoing tobacco abuse, DM, HTN was sent to the ER by his PCP due to the above complaint. His girlfriend reports cough, congestion, chills, feeling feverish, headache for 3-4days. She reported that he threw up yesterday, which contained some phlegm too. His girlfriend took him to his PCP's office today where he was febrile, mildly hypoxic and wheezing and hence referred to the ER.   Review of Systems:  Constitutional:  No weight loss, night sweats, Fevers, chills, fatigue.  HEENT:  No headaches, Difficulty swallowing,Tooth/dental problems,Sore throat,  No sneezing, itching, ear ache, nasal congestion, post nasal drip,  Cardio-vascular:  No chest pain, Orthopnea, PND, swelling in lower extremities, anasarca, dizziness, palpitations  GI:  No heartburn, indigestion, abdominal pain, nausea, vomiting, diarrhea, change in bowel habits, loss of appetite  Resp:  No shortness of breath with exertion or at rest. No excess mucus, no productive cough, No non-productive cough, No coughing up of blood.No change in color of mucus.No wheezing.No chest wall deformity  Skin:  no rash or lesions.  GU:  no dysuria, change in color of urine, no urgency or frequency. No flank pain.  Musculoskeletal:  No joint pain or swelling. No decreased range of motion. No back pain.  Psych:  No change in mood or affect. No depression or anxiety. No memory loss.   Past Medical History  Diagnosis Date  . HTN (hypertension)   . HLD (hyperlipidemia)   . Pulmonary nodule, right     lower lobe  . COPD (chronic obstructive pulmonary disease)   . Rotator cuff injury     right  . Frozen shoulder   . Shoulder pain   . Hyperkalemia   . Dermatitis seborrheica   .  Other diseases of lung, not elsewhere classified   . Kidney stone   . Allergic rhinitis   . ED (erectile dysfunction)   . Diabetes mellitus type II   . Asthma   . Tobacco abuse    Past Surgical History  Procedure Laterality Date  . Cervical discectomy  1998  . Ett  11/1994  . Tm repair    . Tee without cardioversion  12/03/2011    Procedure: TRANSESOPHAGEAL ECHOCARDIOGRAM (TEE);  Surgeon: Lelon Perla, MD;  Location: [facility] ENDOSCOPY;  Service: Cardiovascular;  Laterality: N/A;   Social History:  reports that he has been smoking Cigarettes.  He has been smoking about 0.20 packs per day. He has never used smokeless tobacco. He reports that he does not drink alcohol or use illicit drugs.  Allergies  Allergen Reactions  . Amoxicillin-Pot Clavulanate Other (See Comments)    Severe stomach pain.   . Quinapril Hcl     REACTION: back pain  . Valsartan     REACTION: back pain  . Varenicline Tartrate     REACTION: AMS, hallucinations, night mares    Family History  Problem Relation Age of Onset  . Asthma Sister   . Emphysema Sister      Prior to Admission medications   Medication Sig Start Date End Date Taking? Authorizing Provider  albuterol (PROVENTIL) (2.5 MG/3ML) 0.083% nebulizer solution Take 2.5 mg by nebulization 2 (two) times daily as needed for shortness of breath.    Yes Historical Provider, MD  albuterol (VENTOLIN HFA) 108 (90  BASE) MCG/ACT inhaler Inhale 2 puffs into the lungs every 6 (six) hours as needed for wheezing. 11/22/12  Yes Abner Greenspan, MD  amLODipine (NORVASC) 5 MG tablet Take 1 tablet by mouth  daily 09/27/13  Yes Abner Greenspan, MD  amLODipine (NORVASC) 5 MG tablet Take 5 mg by mouth daily.   Yes Historical Provider, MD  atorvastatin (LIPITOR) 10 MG tablet Take 10 mg by mouth daily.   Yes Historical Provider, MD  glipiZIDE (GLIPIZIDE XL) 10 MG 24 hr tablet Take 1 tablet (10 mg total) by mouth daily. 09/28/13  Yes Abner Greenspan, MD  ibuprofen (ADVIL,MOTRIN) 200  MG tablet Take 400 mg by mouth every 6 (six) hours as needed (pain).    Yes Historical Provider, MD  insulin glargine (LANTUS) 100 units/mL SOLN Inject 40 Units into the skin daily at 8 pm.    Yes Historical Provider, MD  metFORMIN (GLUCOPHAGE) 1000 MG tablet Take 500 mg by mouth 3 (three) times daily with meals. Take one tablet in am,1/2 tablet midday, and 1 tablet in evening. 11/22/12  Yes Abner Greenspan, MD  OVER THE COUNTER MEDICATION Place 2 sprays into the nose 2 (two) times daily as needed (sinuses).   Yes Historical Provider, MD  vardenafil (LEVITRA) 10 MG tablet Take 10 mg by mouth daily as needed for erectile dysfunction.    Yes Historical Provider, MD   Physical Exam: Filed Vitals:   11/23/13 1515  BP: 115/50  Pulse: 109  Temp:   Resp:     BP 115/50  Pulse 109  Temp(Src) 98.4 F (36.9 C) (Oral)  Resp 30  Wt 79.379 kg (175 lb)  SpO2 96%  General:  Appears calm and comfortable Eyes: PERRL, normal lids, irises & conjunctiva ENT: grossly normal hearing, lips & tongue Neck: no LAD, masses or thyromegaly Cardiovascular: RRR, no m/r/g. No LE edema. Telemetry: SR, no arrhythmias  Respiratory: CTA bilaterally, no w/r/r. Normal respiratory effort. Abdomen: soft, ntnd Skin: no rash or induration seen on limited exam Musculoskeletal: grossly normal tone BUE/BLE Psychiatric: grossly normal mood and affect, speech fluent and appropriate Neurologic: grossly non-focal.          Labs on Admission:  Basic Metabolic Panel:  Recent Labs Lab 11/23/13 1224  NA 136*  K 5.0  CL 94*  CO2 26  GLUCOSE 237*  BUN 17  CREATININE 1.00  CALCIUM 9.7   Liver Function Tests: No results found for this basename: AST, ALT, ALKPHOS, BILITOT, PROT, ALBUMIN,  in the last 168 hours No results found for this basename: LIPASE, AMYLASE,  in the last 168 hours No results found for this basename: AMMONIA,  in the last 168 hours CBC:  Recent Labs Lab 11/23/13 1224  WBC 12.8*  HGB 18.0*  HCT  51.4  MCV 88.5  PLT 241   Cardiac Enzymes: No results found for this basename: CKTOTAL, CKMB, CKMBINDEX, TROPONINI,  in the last 168 hours  BNP (last 3 results)  Recent Labs  11/23/13 1224  PROBNP 82.0   CBG: No results found for this basename: GLUCAP,  in the last 168 hours  Radiological Exams on Admission: Dg Chest 2 View  11/23/2013   CLINICAL DATA:  Shortness of breath and cough  EXAM: CHEST  2 VIEW  COMPARISON:  12/02/2011  FINDINGS: Mild interstitial coarsening which appears chronic. No evidence of consolidation in the lateral projection. No edema, effusion, or pneumothorax. Normal heart size.  IMPRESSION: Stable exam.  No edema or consolidation.  Electronically Signed   By: Jorje Guild M.D.   On: 11/23/2013 14:09    EKG: Independently reviewed. pending  Assessment/Plan Active Problems:   HYPERTENSION   Diabetes type 2, uncontrolled   CVA (cerebral infarction)   COPD exacerbation   1. COPD exacerbation -CXR clear -Start IV solumedrol, albuterol and atrovent nebs -check influenza PCR, although rapid flu negative -Levaquin, mucinex -O2  2. DM -Lantus, SSI -hold glipizide  3. H/o CVA -was on ASA in 2013 at time of CVA, no clear reason why this got stopped, will resume, continue statin  4. HTN -continue amlodipine  5. Ongoing tobacco abuse -counseled  DVT proph: lovenox  Code Status: Full Code Family Communication: d/w pt and girlfriend at bedside Disposition Plan: inpatient  Time spent: 40min  Coran Dipaola [facility] Pager [phone]

## 2013-11-23 NOTE — ED Notes (Signed)
Pt was N/V last night with 1 episode of vomiting as well as HA x 3 days. Pt went to PCP this AM for evaluation and noticed to be SOB. Pt denies SOB, but tachyphenic laying in bed with bilateral exp. Wheezing. Pt 93% on RA. Reports smoking appx 2ppd. Wife at bedside.

## 2013-11-23 NOTE — ED Notes (Addendum)
Pt went to PCP for a routine check up today and states his PCP told him to come to the er "because he thought i wasn't breathing well." the pt states he does not feel SOB and he has not had a cough, cold or fevers. He denies pain. He states he had felt lightheaded for the past few days but was not really concerned about that

## 2013-11-23 NOTE — Progress Notes (Signed)
Pre visit review using our clinic review tool, if applicable. No additional management support is needed unless otherwise documented below in the visit note. 

## 2013-11-23 NOTE — Progress Notes (Signed)
Subjective:    Patient ID: Jonathan [name], male    DOB: 08-Jul-1941, 73 y.o.   MRN: [id number]  HPI Here with vomiting and fever and no appetite  Started 2 days ago  Vomited once last night - and that is improved/ less nausea  No diarrhea  Some fullness in R ear- is "plugged up" but not painful  No abdominal pain   Some uri symptoms  Sinuses are congested-green mucous  Cough - sometimes productive - green phlegm   Is shaky this am - has a temp   Takes ibuprofen for fever  Has had a headache   Blood glucose 170s  Skipped meds last night and this am   Flu test today is negative (rapid)  Patient Active Problem List   Diagnosis Date Noted  . Bronchitis, chronic obstructive w acute bronchitis 11/23/2013  . COPD (chronic obstructive pulmonary disease) 11/23/2013  . COPD exacerbation 11/23/2013  . Grief 07/05/2012  . Immunization not carried out because of patient refusal 07/05/2012  . CVA (cerebral infarction) 12/01/2011  . HLD (hyperlipidemia)   . Pulmonary nodule, right   . Rotator cuff injury   . Frozen shoulder   . Shoulder pain   . Hyperkalemia   . Dermatitis seborrheica   . Kidney stone   . Allergic rhinitis   . ED (erectile dysfunction)   . Diabetes type 2, uncontrolled   . Asthma   . Tobacco abuse   . COPD 03/29/2010  . HYPERKALEMIA 12/14/2008  . DERMATITIS, SEBORRHEIC 04/14/2008  . KIDNEY STONE 08/30/2007  . ERECTILE DYSFUNCTION 01/05/2007  . HYPERTENSION 01/05/2007   Past Medical History  Diagnosis Date  . HTN (hypertension)   . HLD (hyperlipidemia)   . Pulmonary nodule, right     lower lobe  . COPD (chronic obstructive pulmonary disease)   . Rotator cuff injury     right  . Frozen shoulder   . Shoulder pain   . Hyperkalemia   . Dermatitis seborrheica   . Other diseases of lung, not elsewhere classified   . Kidney stone   . Allergic rhinitis   . ED (erectile dysfunction)   . Diabetes mellitus type II   . Asthma   . Tobacco abuse    Past  Surgical History  Procedure Laterality Date  . Cervical discectomy  1998  . Ett  11/1994  . Tm repair    . Tee without cardioversion  12/03/2011    Procedure: TRANSESOPHAGEAL ECHOCARDIOGRAM (TEE);  Surgeon: Lelon Perla, MD;  Location: [facility];  Service: Cardiovascular;  Laterality: N/A;   History  Substance Use Topics  . Smoking status: Current Every Day Smoker -- 0.20 packs/day    Types: Cigarettes  . Smokeless tobacco: Never Used     Comment: prev smoked 1 1/2 PPD. Currently 5 cigs daily  . Alcohol Use: No     Comment: Quit 40 years ago   Family History  Problem Relation Age of Onset  . Asthma Sister   . Emphysema Sister    Allergies  Allergen Reactions  . Amoxicillin-Pot Clavulanate Other (See Comments)    Severe stomach pain.   . Quinapril Hcl     REACTION: back pain  . Valsartan     REACTION: back pain  . Varenicline Tartrate     REACTION: AMS, hallucinations, night mares   No current facility-administered medications on file prior to visit.   Current Outpatient Prescriptions on File Prior to Visit  Medication Sig Dispense Refill  .  albuterol (PROVENTIL) (2.5 MG/3ML) 0.083% nebulizer solution Take 2.5 mg by nebulization 2 (two) times daily as needed for shortness of breath.       [name] albuterol (VENTOLIN HFA) 108 (90 BASE) MCG/ACT inhaler Inhale 2 puffs into the lungs every 6 (six) hours as needed for wheezing.  1 Inhaler  11  . amLODipine (NORVASC) 5 MG tablet Take 1 tablet by mouth  daily  90 tablet  0  . glipiZIDE (GLIPIZIDE XL) 10 MG 24 hr tablet Take 1 tablet (10 mg total) by mouth daily.  90 tablet  0  . ibuprofen (ADVIL,MOTRIN) 200 MG tablet Take 400 mg by mouth every 6 (six) hours as needed (pain).       . vardenafil (LEVITRA) 10 MG tablet Take 10 mg by mouth daily as needed for erectile dysfunction.         Review of Systems Review of Systems  Constitutional:pos for fever/ malaise/ loss of appetite and shakiness ENT pos for congestion and ST and neg  for sinus pain  Eyes: Negative for pain and visual disturbance.  Respiratory: pos for cough / wheeze and sob on exertion  Cardiovascular: Negative for cp or palpitations    Gastrointestinal: Negative for nausea, diarrhea and constipation.  Genitourinary: Negative for urgency and frequency.  Skin: Negative for pallor or rash   Neurological: Negative for weakness, light-headedness, numbness and headaches.  Hematological: Negative for adenopathy. Does not bruise/bleed easily.  Psychiatric/Behavioral: Negative for dysphoric mood. The patient is not nervous/anxious.         Objective:   Physical Exam  Constitutional: He appears well-developed and well-nourished. No distress.  Pt is not distressed / but ill appearing and tremulous   HENT:  Head: Normocephalic and atraumatic.  Left Ear: External ear normal.  Mouth/Throat: Oropharynx is clear and moist. No oropharyngeal exudate.  Nares are injected and congested  No sinus tenderness Throat clear Clear rhinorrhea   Eyes: Conjunctivae and EOM are normal. Pupils are equal, round, and reactive to light. Right eye exhibits no discharge. Left eye exhibits no discharge. No scleral icterus.  Neck: Normal range of motion. Neck supple. No JVD present.  Cardiovascular: Regular rhythm and intact distal pulses.   Pulmonary/Chest: Effort normal. No respiratory distress. He has wheezes. He has no rales. He exhibits no tenderness.  Diffusely distant bs  Harsh bs with scattered rhonchi/ worse in bases and fair air exch Diffuse exp wheeze Mildly prolonged exp phase   Musculoskeletal: He exhibits no edema.  Lymphadenopathy:    He has no cervical adenopathy.  Neurological: He is alert.  Pt is tremulous and weak appearing   Skin: Skin is warm and dry. No rash noted. No pallor.  Psychiatric:  Mood is normal - per wife he is underplaying his symptoms          Assessment & Plan:

## 2013-11-23 NOTE — ED Provider Notes (Signed)
CSN: [id number]     Arrival date & time 11/23/13  1200 History   First MD Initiated Contact with Patient 11/23/13 1242     Chief Complaint  Patient presents with  . Shortness of Breath     (Consider location/radiation/quality/duration/timing/severity/associated sxs/prior Treatment) Patient is a 73 y.o. male presenting with shortness of breath. The history is provided by the patient and the spouse.  Shortness of Breath  patient here with a two-day history of increased shortness of breath and productive cough. He does have a history of COPD continues to smoke. He denies any anginal type chest pain. Saw his Dr. today and I reviewed those notes from the office. He had a negative flu test there. He was noted to be mildly hypoxemic with a pulse oximetry of 89% room-air. His daughter had a low-grade temperature as well 2. Was sent here for further evaluation and management. He denies any diarrhea. Denies any dysuria or hematuria. Has used his home nebulizer without response.  Past Medical History  Diagnosis Date  . HTN (hypertension)   . HLD (hyperlipidemia)   . Pulmonary nodule, right     lower lobe  . COPD (chronic obstructive pulmonary disease)   . Rotator cuff injury     right  . Frozen shoulder   . Shoulder pain   . Hyperkalemia   . Dermatitis seborrheica   . Other diseases of lung, not elsewhere classified   . Kidney stone   . Allergic rhinitis   . ED (erectile dysfunction)   . Diabetes mellitus type II   . Asthma   . Tobacco abuse    Past Surgical History  Procedure Laterality Date  . Cervical discectomy  1998  . Ett  11/1994  . Tm repair    . Tee without cardioversion  12/03/2011    Procedure: TRANSESOPHAGEAL ECHOCARDIOGRAM (TEE);  Surgeon: Lelon Perla, MD;  Location: [facility] END[facility];  Service: Cardiovascular;  Laterality: N/A;   Family History  Problem Relation Age of Onset  . Asthma Sister   . Emphysema Sister    History  Substance Use Topics  . Smoking status:  Current Every Day Smoker -- 0.20 packs/day    Types: Cigarettes  . Smokeless tobacco: Never Used     Comment: prev smoked 1 1/2 PPD. Currently 5 cigs daily  . Alcohol Use: No     Comment: Quit 40 years ago    Review of Systems  Respiratory: Positive for shortness of breath.   All other systems reviewed and are negative.      Allergies  Amoxicillin-pot clavulanate; Quinapril hcl; Valsartan; and Varenicline tartrate  Home Medications   Current Outpatient Rx  Name  Route  Sig  Dispense  Refill  . albuterol (PROVENTIL) (2.5 MG/3ML) 0.083% nebulizer solution   Nebulization   Take 2.5 mg by nebulization 2 (two) times daily as needed.           [name] albuterol (VENTOLIN HFA) 108 (90 BASE) MCG/ACT inhaler   Inhalation   Inhale 2 puffs into the lungs every 6 (six) hours as needed for wheezing.   1 Inhaler   11   . amLODipine (NORVASC) 5 MG tablet      Take 1 tablet by mouth  daily   90 tablet   0   . atorvastatin (LIPITOR) 10 MG tablet      Take 1 tablet by mouth  daily   90 tablet   0   . Fluticasone-Salmeterol (ADVAIR DISKUS) 250-50 MCG/DOSE AEPB  Inhalation   Inhale 1 puff into the lungs every 12 (twelve) hours.   60 each   3   . glipiZIDE (GLIPIZIDE XL) 10 MG 24 hr tablet   Oral   Take 1 tablet (10 mg total) by mouth daily.   90 tablet   0     *patient needs appointment*   . glucose blood (FREESTYLE TEST STRIPS) test strip      Check blood sugar twice a day and as needed. 250.00   100 each   6   . ibuprofen (ADVIL,MOTRIN) 200 MG tablet   Oral   Take 400 mg by mouth every 6 (six) hours as needed. As needed for pain.         [name] insulin glargine (LANTUS SOLOSTAR) 100 UNIT/ML injection   Subcutaneous   Inject 40 Units into the skin every evening. 1800   10 mL   11   . Insulin Pen Needle (PEN NEEDLES 5/16") 30G X 8 MM MISC   Subcutaneous   Inject 1 each into the skin daily as needed.   100 each   11   . Lancets 28G MISC   Does not apply   by  Does not apply route 2 (two) times daily.           . metFORMIN (GLUCOPHAGE) 1000 MG tablet      Take one tablet in am by mouth, 1/2 tablet midday, and 1 tablet in evening   225 tablet   3   . mometasone (NASONEX) 50 MCG/ACT nasal spray   Nasal   Place 2 sprays into the nose daily.   17 g   11   . vardenafil (LEVITRA) 10 MG tablet   Oral   Take 10 mg by mouth daily as needed.            BP 150/74  Pulse 96  Temp(Src) 98.4 F (36.9 C) (Oral)  Resp 22  Wt 175 lb (79.379 kg)  SpO2 96% Physical Exam  Nursing note and vitals reviewed. Constitutional: He is oriented to person, place, and time. He appears well-developed and well-nourished.  Non-toxic appearance. No distress.  HENT:  Head: Normocephalic and atraumatic.  Eyes: Conjunctivae, EOM and lids are normal. Pupils are equal, round, and reactive to light.  Neck: Normal range of motion. Neck supple. No tracheal deviation present. No mass present.  Cardiovascular: Normal rate, regular rhythm and normal heart sounds.  Exam reveals no gallop.   No murmur heard. Pulmonary/Chest: Effort normal. No stridor. No respiratory distress. He has decreased breath sounds. He has wheezes. He has no rhonchi. He has no rales.  Abdominal: Soft. Normal appearance and bowel sounds are normal. He exhibits no distension. There is no tenderness. There is no rebound and no CVA tenderness.  Musculoskeletal: Normal range of motion. He exhibits no edema and no tenderness.  Neurological: He is alert and oriented to person, place, and time. He has normal strength. No cranial nerve deficit or sensory deficit. GCS eye subscore is 4. GCS verbal subscore is 5. GCS motor subscore is 6.  Skin: Skin is warm and dry. No abrasion and no rash noted.  Psychiatric: He has a normal mood and affect. His speech is normal and behavior is normal.    ED Course  Procedures (including critical care time) Labs Review Labs Reviewed  CBC - Abnormal; Notable for the  following:    WBC 12.8 (*)    Hemoglobin 18.0 (*)    All other components within  normal limits  BASIC METABOLIC PANEL - Abnormal; Notable for the following:    Sodium 136 (*)    Chloride 94 (*)    Glucose, Bld 237 (*)    GFR calc non Af Amer 73 (*)    GFR calc Af Amer 84 (*)    All other components within normal limits  URINE CULTURE  PRO B NATRIURETIC PEPTIDE  URINALYSIS, ROUTINE W REFLEX MICROSCOPIC  I-STAT TROPOININ, ED   Imaging Review Dg Chest 2 View  11/23/2013   CLINICAL DATA:  Shortness of breath and cough  EXAM: CHEST  2 VIEW  COMPARISON:  12/02/2011  FINDINGS: Mild interstitial coarsening which appears chronic. No evidence of consolidation in the lateral projection. No edema, effusion, or pneumothorax. Normal heart size.  IMPRESSION: Stable exam.  No edema or consolidation.   Electronically Signed   By: Jorje Guild M.D.   On: 11/23/2013 14:09    EKG Interpretation   None       MDM   Final diagnoses:  None    Patient given albuterol continuous treatment here along with Solu-Medrol. Wheezing continues and patient will require hospitalization for treatment of his COPD    Leota Jacobsen, MD 11/23/13 1432

## 2013-11-23 NOTE — ED Notes (Signed)
Contacted flo manager about wait time; pt waiting on clean bed. Charge RN made aware.

## 2013-11-23 NOTE — ED Notes (Signed)
Contacted pharmacy x 2. Pt currently eating meal.

## 2013-11-23 NOTE — ED Notes (Signed)
CBG resulted 353; Brittney, RN notified

## 2013-11-23 NOTE — ED Notes (Signed)
Pt given a cup of water and helped pt readjust on the stretcher; family at bedside

## 2013-11-23 NOTE — Patient Instructions (Signed)
Take Mr. Jonathan [name] to [facility] ER now for further evaluation of respiratory disease and shortness of breath

## 2013-11-24 DIAGNOSIS — E785 Hyperlipidemia, unspecified: Secondary | ICD-10-CM

## 2013-11-24 LAB — BASIC METABOLIC PANEL
BUN: 19 mg/dL (ref 6–23)
CO2: 26 mEq/L (ref 19–32)
Calcium: 9.5 mg/dL (ref 8.4–10.5)
Chloride: 93 mEq/L — ABNORMAL LOW (ref 96–112)
Creatinine, Ser: 0.92 mg/dL (ref 0.50–1.35)
GFR calc Af Amer: >90 mL/min (ref >90)
GFR calc non Af Amer: 82 mL/min — ABNORMAL LOW (ref >90)
Glucose, Bld: 267 mg/dL — ABNORMAL HIGH (ref 70–99)
Potassium: 4.6 mEq/L (ref 3.7–5.3)
Sodium: 132 mEq/L — ABNORMAL LOW (ref 137–147)

## 2013-11-24 LAB — CBC
HCT: 47.8 % (ref 39.0–52.0)
Hemoglobin: 16.6 g/dL (ref 13.0–17.0)
MCH: 30.2 pg (ref 26.0–34.0)
MCHC: 34.7 g/dL (ref 30.0–36.0)
MCV: 86.9 fL (ref 78.0–100.0)
Platelets: 233 10*3/uL (ref 150–400)
RBC: 5.5 MIL/uL (ref 4.22–5.81)
RDW: 13.4 % (ref 11.5–15.5)
WBC: 13.8 10*3/uL — ABNORMAL HIGH (ref 4.0–10.5)

## 2013-11-24 LAB — GLUCOSE, CAPILLARY
Glucose-Capillary: 267 mg/dL — ABNORMAL HIGH (ref 70–99)
Glucose-Capillary: 259 mg/dL — ABNORMAL HIGH (ref 70–99)
Glucose-Capillary: 280 mg/dL — ABNORMAL HIGH (ref 70–99)
Glucose-Capillary: 334 mg/dL — ABNORMAL HIGH (ref 70–99)
Glucose-Capillary: 314 mg/dL — ABNORMAL HIGH (ref 70–99)

## 2013-11-24 LAB — URINE CULTURE
Colony Count: NO GROWTH
Culture: NO GROWTH

## 2013-11-24 MED ORDER — HYDROCOD POLST-CHLORPHEN POLST 10-8 MG/5ML PO LQCR: 5.0000 mL | Freq: Two times a day (BID) | ORAL | Status: DC | PRN

## 2013-11-24 MED ORDER — METHYLPREDNISOLONE SODIUM SUCC 125 MG IJ SOLR
60.0000 mg | Freq: Three times a day (TID) | INTRAMUSCULAR | Status: DC
  Administered 2013-11-24 – 2013-11-25 (×3): 60 mg via INTRAVENOUS
  Filled 2013-11-24 (×6): qty 0.96

## 2013-11-24 MED ORDER — INSULIN GLARGINE 100 UNIT/ML ~~LOC~~ SOLN
40.0000 [IU] | Freq: Every day | SUBCUTANEOUS | Status: DC
  Administered 2013-11-24 – 2013-11-25 (×2): 40 [IU] via SUBCUTANEOUS
  Filled 2013-11-24 (×3): qty 0.4

## 2013-11-24 MED ORDER — IPRATROPIUM-ALBUTEROL 0.5-2.5 (3) MG/3ML IN SOLN
3.0000 mL | Freq: Once | RESPIRATORY_TRACT | Status: DC
  Filled 2013-11-24: qty 3

## 2013-11-24 MED ORDER — PANTOPRAZOLE SODIUM 40 MG PO TBEC
40.0000 mg | DELAYED_RELEASE_TABLET | Freq: Every day | ORAL | Status: DC
  Administered 2013-11-24 – 2013-11-26 (×3): 40 mg via ORAL
  Filled 2013-11-24 (×3): qty 1

## 2013-11-24 NOTE — Progress Notes (Signed)
Inpatient Diabetes Program Recommendations  AACE/ADA: New Consensus Statement on Inpatient Glycemic Control (2013)  Target Ranges:  Prepandial:   less than 140 mg/dL      Peak postprandial:   less than 180 mg/dL (1-2 hours)      Critically ill patients:  140 - 180 mg/dL   Reason for Visit: CBGs on 2/25: 353-278 mg/dl                               2/26: 278 mg/dl  Diabetes history: type 2  Outpatient Diabetes medications:Lantus 40 units daily, Glipizide 10 mg daily, Metformin 1000 mg TID Current orders for Inpatient glycemic control: Lantus 30 units every HS, Novolog MODERATE correction scale TID.     Note: Patient currently admitted with COPD, wheezing.  On Solu-medrol 80 mg IV every 6 hours. CBGs continue to be greater than 180 mg/dl.  Recommend increasing Lantus to 35-40 units daily if CBGs continue to be elevated.  Continue Novolog MODERATE correction scale TID. Will continue to follow while in hospital.  Could check HgbA1C.  Last HgbA1C in August, 2014 was 7.7%. Harvel Ricks RN BSN CDE

## 2013-11-24 NOTE — Progress Notes (Signed)
PATIENT DETAILS Name: Jonathan [name] Age: 73 y.o. Sex: male Date of Birth: May 21, 1941 Admit Date: 11/23/2013 Admitting Physician Domenic Polite, MD GGY:IRSWN Tower, MD  Subjective: Breathing better today.  Assessment/Plan: COPD exacerbation - Clinically improved - Continue with Solu-Medrol, however would decrease dose slightly, continue with nebulized bronchodilators. Also continue with Levaquin. - Influenza PCR negative  Nausea and vomiting - Seems to have resolved. Continue with supportive care. - Follow clinically  Diabetes - CBGs elevated likely secondary to steroids - Increase Lantus to 40 units, continue with SSI - Will continue to follow CBGs and adjust accordingly - Metformin and glipizide remain on hold  Hypertension - Controlled with amlodipine  Dyslipidemia - Continue with statins  Tobacco use - Counseled extensively.  Disposition: Remain inpatient  DVT Prophylaxis: Prophylactic Lovenox   Code Status: Full code   Family Communication "Friend"at bedside  Procedures: None  CONSULTS:  None  Time spent 40 minutes-which includes 50% of the time with face-to-face with patient/ family and coordinating care related to the above assessment and plan.    MEDICATIONS: Scheduled Meds: . amLODipine  5 mg Oral Daily  . aspirin  325 mg Oral Daily  . atorvastatin  10 mg Oral Daily  . enoxaparin (LOVENOX) injection  40 mg Subcutaneous Q24H  . guaiFENesin  600 mg Oral BID  . insulin aspart  0-15 Units Subcutaneous TID WC  . insulin glargine  30 Units Subcutaneous QHS  . ipratropium-albuterol  3 mL Nebulization QID  . levofloxacin (LEVAQUIN) IV  500 mg Intravenous Q24H  . methylPREDNISolone (SOLU-MEDROL) injection  80 mg Intravenous Q6H  . pantoprazole  40 mg Oral Q1200   Continuous Infusions:  PRN Meds:.acetaminophen, albuterol, chlorpheniramine-HYDROcodone, ondansetron (ZOFRAN) IV  Antibiotics: Anti-infectives   Start     Dose/Rate  Route Frequency Ordered Stop   11/23/13 1700  levofloxacin (LEVAQUIN) IVPB 500 mg     500 mg 100 mL/hr over 60 Minutes Intravenous Every 24 hours 11/23/13 1605         PHYSICAL EXAM: Vital signs in last 24 hours: Filed Vitals:   11/23/13 1800 11/23/13 1830 11/23/13 1942 11/24/13 0645  BP: 119/43 116/50 152/71 121/70  Pulse: 101 102 104 85  Temp:   99.3 F (37.4 C) 98.2 F (36.8 C)  TempSrc:   Oral Oral  Resp:   24 22  Height:   5\' 8"  (1.727 m)   Weight:   84.4 kg (186 lb 1.1 oz)   SpO2: 96% 96% 92% 91%    Weight change:  Filed Weights   11/23/13 1212 11/23/13 1942  Weight: 79.379 kg (175 lb) 84.4 kg (186 lb 1.1 oz)   Body mass index is 28.3 kg/(m^2).   Gen Exam: Awake and alert with clear speech.  Not in any distress. Neck: Supple, No JVD.   Chest: B/L Clear.  Coarse rhonchi all over. CVS: S1 S2 Regular, no murmurs.  Abdomen: soft, BS +, non tender, non distended.  Extremities: no edema, lower extremities warm to touch. Neurologic: Non Focal.   Skin: No Rash.   Wounds: N/A.   Intake/Output from previous day:  Intake/Output Summary (Last 24 hours) at 11/24/13 1137 Last data filed at 11/24/13 1005  Gross per 24 hour  Intake    360 ml  Output    550 ml  Net   -190 ml     LAB RESULTS: CBC  Recent Labs Lab 11/23/13 1224 11/24/13 0417  WBC 12.8* 13.8*  HGB  18.0* 16.6  HCT 51.4 47.8  PLT 241 233  MCV 88.5 86.9  MCH 31.0 30.2  MCHC 35.0 34.7  RDW 13.6 13.4    Chemistries   Recent Labs Lab 11/23/13 1224 11/24/13 0417  NA 136* 132*  K 5.0 4.6  CL 94* 93*  CO2 26 26  GLUCOSE 237* 267*  BUN 17 19  CREATININE 1.00 0.92  CALCIUM 9.7 9.5    CBG:  Recent Labs Lab 11/23/13 1723 11/23/13 2152 11/24/13 0756  GLUCAP 353* 278* 267*    GFR Estimated Creatinine Clearance: 75.7 ml/min (by C-G formula based on Cr of 0.92).  Coagulation profile No results found for this basename: INR, PROTIME,  in the last 168 hours  Cardiac Enzymes No  results found for this basename: CK, CKMB, TROPONINI, MYOGLOBIN,  in the last 168 hours  No components found with this basename: POCBNP,  No results found for this basename: DDIMER,  in the last 72 hours No results found for this basename: HGBA1C,  in the last 72 hours No results found for this basename: CHOL, HDL, LDLCALC, TRIG, CHOLHDL, LDLDIRECT,  in the last 72 hours No results found for this basename: TSH, T4TOTAL, FREET3, T3FREE, THYROIDAB,  in the last 72 hours No results found for this basename: VITAMINB12, FOLATE, FERRITIN, TIBC, IRON, RETICCTPCT,  in the last 72 hours No results found for this basename: LIPASE, AMYLASE,  in the last 72 hours  Urine Studies No results found for this basename: UACOL, UAPR, USPG, UPH, UTP, UGL, UKET, UBIL, UHGB, UNIT, UROB, ULEU, UEPI, UWBC, URBC, UBAC, CAST, CRYS, UCOM, BILUA,  in the last 72 hours  MICROBIOLOGY: Recent Results (from the past 240 hour(s))  URINE CULTURE     Status: None   Collection Time    11/23/13  2:32 PM      Result Value Ref Range Status   Specimen Description URINE, CLEAN CATCH   Final   Special Requests NONE   Final   Culture  Setup Time     Final   Value: 11/23/2013 14:54     Performed at [facility]     Final   Value: NO GROWTH     Performed at [facility]   Culture     Final   Value: NO GROWTH     Performed at [facility]   Report Status 11/24/2013 FINAL   Final    RADIOLOGY STUDIES/RESULTS: Dg Chest 2 View  11/23/2013   CLINICAL DATA:  Shortness of breath and cough  EXAM: CHEST  2 VIEW  COMPARISON:  12/02/2011  FINDINGS: Mild interstitial coarsening which appears chronic. No evidence of consolidation in the lateral projection. No edema, effusion, or pneumothorax. Normal heart size.  IMPRESSION: Stable exam.  No edema or consolidation.   Electronically Signed   By: Jorje Guild M.D.   On: 11/23/2013 14:09    Oren Binet, MD  [facility] Pager:[phone]  [phone]  If 7PM-7AM, please contact night-coverage [email or web address] Password [facility] 11/24/2013, 11:37 AM   LOS: 1 day

## 2013-11-24 NOTE — Progress Notes (Signed)
Pt ordered to be on continuous pulse ox. Materials management is out of pulse ox equipment at this time. Will continue to check back with them.

## 2013-11-24 NOTE — Progress Notes (Signed)
Pt arrived to unit alert and oriented x4. Oriented to unit, staff and room. Call bell within reach and bed in lowest position. Will continue to monitor.

## 2013-11-24 NOTE — Evaluation (Signed)
Physical Therapy Evaluation Patient Details Name: Jonathan [name] MRN: [id number] DOB: [birth year] Today's Date: 11/24/2013 Time: 4132-4401 PT Time Calculation (min): 19 min  PT Assessment / Plan / Recommendation History of Present Illness  Pt adm with COPD exacerbation.  Clinical Impression  Pt admitted with above. Pt currently with functional limitations due to the deficits listed below (see PT Problem List).  Pt will benefit from skilled PT to increase their independence and safety with mobility to allow discharge to the venue listed below.       PT Assessment  Patient needs continued PT services    Follow Up Recommendations  Home health PT    Does the patient have the potential to tolerate intense rehabilitation      Barriers to Discharge        Equipment Recommendations  Other (comment) (TBD)    Recommendations for Other Services     Frequency Min 3X/week    Precautions / Restrictions Precautions Precautions: Fall   Pertinent Vitals/Pain See flow sheet.      Mobility  Bed Mobility Overal bed mobility: Needs Assistance Bed Mobility: Supine to Sit;Sit to Supine Supine to sit: Min assist Sit to supine: Modified independent (Device/Increase time) General bed mobility comments: Assist to bring trunk up after pt failed to be able to use rocking momentum to get up without assistance. Transfers Overall transfer level: Needs assistance Equipment used: Rolling walker (2 wheeled) Transfers: Sit to/from Stand Sit to Stand: Min guard General transfer comment: Assist for balance Ambulation/Gait Ambulation/Gait assistance: Min guard Ambulation Distance (Feet): 170 Feet Assistive device: Rolling walker (2 wheeled) Gait Pattern/deviations: Step-through pattern;Decreased stride length Gait velocity interpretation: Below normal speed for age/gender General Gait Details: Verbal cues to stay closer to walker. Pt slightly unsteady and shaky.    Exercises     PT Diagnosis:  Difficulty walking;Generalized weakness  PT Problem List: Decreased strength;Decreased activity tolerance;Decreased balance;Decreased mobility;Cardiopulmonary status limiting activity;Decreased knowledge of use of DME;Decreased knowledge of precautions PT Treatment Interventions: DME instruction;Gait training;Functional mobility training;Therapeutic exercise;Therapeutic activities;Patient/family education;Balance training     PT Goals(Current goals can be found in the care plan section) Acute Rehab PT Goals Patient Stated Goal: Return home PT Goal Formulation: With patient Time For Goal Achievement: 12/01/13 Potential to Achieve Goals: Good  Visit Information  Last PT Received On: 11/24/13 Assistance Needed: +1 History of Present Illness: Pt adm with COPD exacerbation.       Prior [place] expects to be discharged to:: Private residence Living Arrangements: Spouse/significant other (girlfriend) Available Help at Discharge: Family Type of Home: Mobile home Home Access: Stairs to enter Technical brewer of Steps: 4 Entrance Stairs-Rails: Right;Left;Can reach both Home Layout: One level [place]: [place] - single point Prior Function Level of Independence: Independent with assistive device(s) Comments: Uses cane at times.    Cognition  Cognition Arousal/Alertness: Awake/alert Behavior During Therapy: WFL for tasks assessed/performed Overall Cognitive Status: Within Functional Limits for tasks assessed    Extremity/Trunk Assessment Upper Extremity Assessment Upper Extremity Assessment: Generalized weakness Lower Extremity Assessment Lower Extremity Assessment: Generalized weakness   Balance Balance Overall balance assessment: Needs assistance Standing balance support: No upper extremity supported Standing balance-Leahy Scale: Fair  End of Session PT - End of Session Equipment Utilized During Treatment: Gait belt Activity Tolerance:  Patient limited by fatigue Patient left: in bed;with call bell/phone within reach;with bed alarm set Nurse Communication: Mobility status;Other (comment) (decr SaO2)  GP     Raidon Swanner 11/24/2013, 4:10 PM  [employer] PT [phone]

## 2013-11-25 ENCOUNTER — Telehealth: Payer: Self-pay | Admitting: Family Medicine

## 2013-11-25 DIAGNOSIS — F17200 Nicotine dependence, unspecified, uncomplicated: Secondary | ICD-10-CM

## 2013-11-25 DIAGNOSIS — M7989 Other specified soft tissue disorders: Secondary | ICD-10-CM

## 2013-11-25 LAB — GLUCOSE, CAPILLARY
Glucose-Capillary: 289 mg/dL — ABNORMAL HIGH (ref 70–99)
Glucose-Capillary: 328 mg/dL — ABNORMAL HIGH (ref 70–99)
Glucose-Capillary: 319 mg/dL — ABNORMAL HIGH (ref 70–99)
Glucose-Capillary: 242 mg/dL — ABNORMAL HIGH (ref 70–99)

## 2013-11-25 MED ORDER — METHYLPREDNISOLONE SODIUM SUCC 40 MG IJ SOLR
40.0000 mg | Freq: Three times a day (TID) | INTRAMUSCULAR | Status: DC
  Administered 2013-11-25 – 2013-11-26 (×4): 40 mg via INTRAVENOUS
  Filled 2013-11-25 (×7): qty 1

## 2013-11-25 NOTE — Telephone Encounter (Signed)
Relevant patient education mailed to patient.  

## 2013-11-25 NOTE — Progress Notes (Signed)
PATIENT DETAILS Name: Jonathan [name] Age: 73 y.o. Sex: male Date of Birth: 10/31/1940 Admit Date: 11/23/2013 Admitting Physician Domenic Polite, MD KKX:FGHWE Tower, MD  Subjective: Breathing better today.  Assessment/Plan: COPD exacerbation - Clinically improved - Continue with Solu-Medrol, however would continue to taper, continue with nebulized bronchodilators. -Also continue with Levaquin. - Influenza PCR negative - May need home oxygen on discharge  Nausea and vomiting - Seems to have resolved. Continue with supportive care. - Follow clinically  Diabetes - CBGs elevated likely secondary to steroids - Continue with Lantus  40 units, continue with SSI - Will continue to follow CBGs and adjust accordingly - Metformin and glipizide remain on hold  Hypertension - Controlled with amlodipine  Dyslipidemia - Continue with statins  Tobacco use - Counseled extensively.  Disposition: Remain inpatient- home the next 1-2 days  DVT Prophylaxis: Prophylactic Lovenox   Code Status: Full code   Family Communication "Friend"at bedside  Procedures: None  CONSULTS:  None  MEDICATIONS: Scheduled Meds: . amLODipine  5 mg Oral Daily  . aspirin  325 mg Oral Daily  . atorvastatin  10 mg Oral Daily  . enoxaparin (LOVENOX) injection  40 mg Subcutaneous Q24H  . guaiFENesin  600 mg Oral BID  . insulin aspart  0-15 Units Subcutaneous TID WC  . insulin glargine  40 Units Subcutaneous QHS  . ipratropium-albuterol  3 mL Nebulization QID  . ipratropium-albuterol  3 mL Nebulization Once  . levofloxacin (LEVAQUIN) IV  500 mg Intravenous Q24H  . methylPREDNISolone (SOLU-MEDROL) injection  60 mg Intravenous 3 times per day  . pantoprazole  40 mg Oral Q1200   Continuous Infusions:  PRN Meds:.acetaminophen, albuterol, chlorpheniramine-HYDROcodone, ondansetron (ZOFRAN) IV  Antibiotics: Anti-infectives   Start     Dose/Rate Route Frequency Ordered Stop   11/23/13 1700   levofloxacin (LEVAQUIN) IVPB 500 mg     500 mg 100 mL/hr over 60 Minutes Intravenous Every 24 hours 11/23/13 1605         PHYSICAL EXAM: Vital signs in last 24 hours: Filed Vitals:   11/24/13 1535 11/24/13 2123 11/25/13 0533 11/25/13 0748  BP:  144/76 148/71   Pulse:   79   Temp:  98.1 F (36.7 C) 98.1 F (36.7 C)   TempSrc:  Oral Oral   Resp:  18 18   Height:      Weight:      SpO2: 89% 95% 96% 96%    Weight change:  Filed Weights   11/23/13 1212 11/23/13 1942  Weight: 79.379 kg (175 lb) 84.4 kg (186 lb 1.1 oz)   Body mass index is 28.3 kg/(m^2).   Gen Exam: Awake and alert with clear speech.  Not in any distress. Neck: Supple, No JVD.   Chest: B/L Clear.  Still with rhonchi, but much less than yesterday. CVS: S1 S2 Regular, no murmurs.  Abdomen: soft, BS +, non tender, non distended.  Extremities: no edema, lower extremities warm to touch. Neurologic: Non Focal.   Skin: No Rash.   Wounds: N/A.   Intake/Output from previous day:  Intake/Output Summary (Last 24 hours) at 11/25/13 1103 Last data filed at 11/25/13 0535  Gross per 24 hour  Intake    200 ml  Output   1300 ml  Net  -1100 ml     LAB RESULTS: CBC  Recent Labs Lab 11/23/13 1224 11/24/13 0417  WBC 12.8* 13.8*  HGB 18.0* 16.6  HCT 51.4 47.8  PLT 241  233  MCV 88.5 86.9  MCH 31.0 30.2  MCHC 35.0 34.7  RDW 13.6 13.4    Chemistries   Recent Labs Lab 11/23/13 1224 11/24/13 0417  NA 136* 132*  K 5.0 4.6  CL 94* 93*  CO2 26 26  GLUCOSE 237* 267*  BUN 17 19  CREATININE 1.00 0.92  CALCIUM 9.7 9.5    CBG:  Recent Labs Lab 11/24/13 1236 11/24/13 1713 11/24/13 2144 11/24/13 2301 11/25/13 0822  GLUCAP 259* 280* 334* 314* 289*    GFR Estimated Creatinine Clearance: 75.7 ml/min (by C-G formula based on Cr of 0.92).  Coagulation profile No results found for this basename: INR, PROTIME,  in the last 168 hours  Cardiac Enzymes No results found for this basename: CK, CKMB,  TROPONINI, MYOGLOBIN,  in the last 168 hours  No components found with this basename: POCBNP,  No results found for this basename: DDIMER,  in the last 72 hours No results found for this basename: HGBA1C,  in the last 72 hours No results found for this basename: CHOL, HDL, LDLCALC, TRIG, CHOLHDL, LDLDIRECT,  in the last 72 hours No results found for this basename: TSH, T4TOTAL, FREET3, T3FREE, THYROIDAB,  in the last 72 hours No results found for this basename: VITAMINB12, FOLATE, FERRITIN, TIBC, IRON, RETICCTPCT,  in the last 72 hours No results found for this basename: LIPASE, AMYLASE,  in the last 72 hours  Urine Studies No results found for this basename: UACOL, UAPR, USPG, UPH, UTP, UGL, UKET, UBIL, UHGB, UNIT, UROB, ULEU, UEPI, UWBC, URBC, UBAC, CAST, CRYS, UCOM, BILUA,  in the last 72 hours  MICROBIOLOGY: Recent Results (from the past 240 hour(s))  URINE CULTURE     Status: None   Collection Time    11/23/13  2:32 PM      Result Value Ref Range Status   Specimen Description URINE, CLEAN CATCH   Final   Special Requests NONE   Final   Culture  Setup Time     Final   Value: 11/23/2013 14:54     Performed at [facility]     Final   Value: NO GROWTH     Performed at [facility]   Culture     Final   Value: NO GROWTH     Performed at [facility]   Report Status 11/24/2013 FINAL   Final    RADIOLOGY STUDIES/RESULTS: Dg Chest 2 View  11/23/2013   CLINICAL DATA:  Shortness of breath and cough  EXAM: CHEST  2 VIEW  COMPARISON:  12/02/2011  FINDINGS: Mild interstitial coarsening which appears chronic. No evidence of consolidation in the lateral projection. No edema, effusion, or pneumothorax. Normal heart size.  IMPRESSION: Stable exam.  No edema or consolidation.   Electronically Signed   By: Jorje Guild M.D.   On: 11/23/2013 14:09    Oren Binet, MD  [facility] Pager:[phone]  If 7PM-7AM, please contact  night-coverage [email or web address] Password [email or web address] 11/25/2013, 11:03 AM   LOS: 2 days

## 2013-11-25 NOTE — Progress Notes (Signed)
Results for TRAPPER, MEECH (MRN [id number]) as of 11/25/2013 13:41  Ref. Range 11/24/2013 17:13 11/24/2013 21:44 11/24/2013 23:01 11/25/2013 08:22 11/25/2013 12:37  Glucose-Capillary Latest Range: 70-99 mg/dL 280 (H) 334 (H) 314 (H) 289 (H) 328 (H)    Note that CBGs greater than 180 mg/dl.  Recommend increasing Lantus to 45 units daily, especially while on steroids.  May need to increase Novolog correction scale to RESISTANT if CBGs continue to be elevated. Will continue to follow.  Harvel Ricks RN BSN CDE

## 2013-11-25 NOTE — Progress Notes (Signed)
Physical Therapy Treatment Patient Details Name: Jonathan [name] MRN: [id number] DOB: [birth year] Today's Date: 11/25/2013 Time: 8657-8469 PT Time Calculation (min): 14 min  PT Assessment / Plan / Recommendation  History of Present Illness Pt adm with COPD exacerbation.   PT Comments   *Increased gait distance today. Improved O2 saturation with walking. 89-95% SaO2 on RA with walking.  **  Follow Up Recommendations  Home health PT     Does the patient have the potential to tolerate intense rehabilitation     Barriers to Discharge        Equipment Recommendations  Rolling walker with 5" wheels (TBD)    Recommendations for Other Services    Frequency Min 3X/week   Progress towards PT Goals Progress towards PT goals: Progressing toward goals  Plan Current plan remains appropriate    Precautions / Restrictions Precautions Precautions: Fall Restrictions Weight Bearing Restrictions: No   Pertinent Vitals/Pain **0/10 pain SaO2 96% on RA at rest, 89-95% on RA walking HR 117 with walking *    Mobility  Bed Mobility Overal bed mobility: Modified Independent Bed Mobility: Supine to Sit;Sit to Supine Supine to sit: Modified independent (Device/Increase time) Sit to supine: Modified independent (Device/Increase time) General bed mobility comments: HOB elevated, used rail Transfers Overall transfer level: Needs assistance Equipment used: Rolling walker (2 wheeled) Transfers: Sit to/from Stand Sit to Stand: Supervision General transfer comment: VCs for hand placement Ambulation/Gait Ambulation/Gait assistance: Supervision Ambulation Distance (Feet): 225 Feet Assistive device: Rolling walker (2 wheeled) Gait Pattern/deviations: WFL(Within Functional Limits) Gait velocity interpretation: at or above normal speed for age/gender General Gait Details: Verbal cues to stay closer to walker. SaO2 89-94% on RA walking, HR 114-117 with walking.    Exercises     PT Diagnosis:     PT Problem List:   PT Treatment Interventions:     PT Goals (current goals can now be found in the care plan section) Acute Rehab PT Goals Patient Stated Goal: Return home PT Goal Formulation: With patient Time For Goal Achievement: 12/01/13 Potential to Achieve Goals: Good  Visit Information  Last PT Received On: 11/25/13 Assistance Needed: +1 History of Present Illness: Pt adm with COPD exacerbation.    Subjective Data  Patient Stated Goal: Return home   Cognition  Cognition Arousal/Alertness: Awake/alert Behavior During Therapy: WFL for tasks assessed/performed Overall Cognitive Status: Within Functional Limits for tasks assessed    Balance     End of Session PT - End of Session Equipment Utilized During Treatment: Gait belt Activity Tolerance: Patient tolerated treatment well Patient left: in bed;with call bell/phone within reach Nurse Communication: Mobility status   GP     Jonathan [name] 11/25/2013, 2:16 PM [phone]

## 2013-11-25 NOTE — Progress Notes (Signed)
*  Preliminary Results* Bilateral lower extremity venous duplex completed. Bilateral lower extremities are negative for deep vein thrombosis. There is no evidence of Baker's cyst bilaterally.  11/25/2013  Maudry Mayhew, RVT, RDCS, RDMS

## 2013-11-26 LAB — GLUCOSE, CAPILLARY
Glucose-Capillary: 295 mg/dL — ABNORMAL HIGH (ref 70–99)
Glucose-Capillary: 319 mg/dL — ABNORMAL HIGH (ref 70–99)

## 2013-11-26 MED ORDER — LEVOFLOXACIN 500 MG PO TABS: 500.0000 mg | ORAL_TABLET | Freq: Every day | ORAL | Status: DC

## 2013-11-26 MED ORDER — ASPIRIN 81 MG PO TABS: 81.0000 mg | ORAL_TABLET | Freq: Every day | ORAL | Status: DC

## 2013-11-26 MED ORDER — PANTOPRAZOLE SODIUM 40 MG PO TBEC: 40.0000 mg | DELAYED_RELEASE_TABLET | Freq: Every day | ORAL | Status: DC

## 2013-11-26 MED ORDER — HYDROCOD POLST-CHLORPHEN POLST 10-8 MG/5ML PO LQCR: 5.0000 mL | Freq: Two times a day (BID) | ORAL | Status: DC | PRN

## 2013-11-26 MED ORDER — NICOTINE 14 MG/24HR TD PT24: 14.0000 mg | MEDICATED_PATCH | Freq: Every day | TRANSDERMAL | Status: DC

## 2013-11-26 MED ORDER — PREDNISONE 10 MG PO TABS: ORAL_TABLET | ORAL | Status: DC

## 2013-11-26 MED ORDER — ALBUTEROL SULFATE (2.5 MG/3ML) 0.083% IN NEBU: 2.5000 mg | INHALATION_SOLUTION | Freq: Two times a day (BID) | RESPIRATORY_TRACT | Status: DC | PRN

## 2013-11-26 MED ORDER — GUAIFENESIN ER 600 MG PO TB12: 600.0000 mg | ORAL_TABLET | Freq: Two times a day (BID) | ORAL | Status: DC

## 2013-11-26 MED ORDER — TIOTROPIUM BROMIDE MONOHYDRATE 18 MCG IN CAPS: 18.0000 ug | ORAL_CAPSULE | Freq: Every day | RESPIRATORY_TRACT | Status: DC

## 2013-11-26 NOTE — Discharge Summary (Addendum)
PATIENT DETAILS Name: Jonathan [name] Age: 73 y.o. Sex: male Date of Birth: 09-13-1941 MRN: [id number]. Admit Date: 11/23/2013 Admitting Physician: Domenic Polite, MD VAP:OLIDC Tower, MD  Recommendations for Outpatient Follow-up:  1. Needs ongoing counseling to stop smoking 2. Optimize COPD medications and inhaler regimen 3. Gen. health maintenance  PRIMARY DISCHARGE DIAGNOSIS:  Active Problems:   HYPERTENSION   Diabetes type 2, uncontrolled   CVA (cerebral infarction)   COPD exacerbation      PAST MEDICAL HISTORY: Past Medical History  Diagnosis Date  . HTN (hypertension)   . HLD (hyperlipidemia)   . Pulmonary nodule, right     lower lobe  . COPD (chronic obstructive pulmonary disease)   . Rotator cuff injury     right  . Frozen shoulder   . Shoulder pain   . Hyperkalemia   . Dermatitis seborrheica   . Other diseases of lung, not elsewhere classified   . Kidney stone   . Allergic rhinitis   . ED (erectile dysfunction)   . Diabetes mellitus type II   . Asthma   . Tobacco abuse     DISCHARGE MEDICATIONS:   Medication List         albuterol 108 (90 BASE) MCG/ACT inhaler  Commonly known as:  VENTOLIN HFA  Inhale 2 puffs into the lungs every 6 (six) hours as needed for wheezing.     albuterol (2.5 MG/3ML) 0.083% nebulizer solution  Commonly known as:  PROVENTIL  Take 3 mLs (2.5 mg total) by nebulization 2 (two) times daily as needed for shortness of breath.     amLODipine 5 MG tablet  Commonly known as:  NORVASC  Take 5 mg by mouth daily.     aspirin 81 MG tablet  Take 1 tablet (81 mg total) by mouth daily.     atorvastatin 10 MG tablet  Commonly known as:  LIPITOR  Take 10 mg by mouth daily.     chlorpheniramine-HYDROcodone 10-8 MG/5ML Lqcr  Commonly known as:  TUSSIONEX  Take 5 mLs by mouth every 12 (twelve) hours as needed for cough.     glipiZIDE 10 MG 24 hr tablet  Commonly known as:  GLIPIZIDE XL  Take 1 tablet (10 mg total) by mouth  daily.     guaiFENesin 600 MG 12 hr tablet  Commonly known as:  MUCINEX  Take 1 tablet (600 mg total) by mouth 2 (two) times daily.     ibuprofen 200 MG tablet  Commonly known as:  ADVIL,MOTRIN  Take 400 mg by mouth every 6 (six) hours as needed (pain).     insulin glargine 100 units/mL Soln  Commonly known as:  LANTUS  Inject 40 Units into the skin daily at 8 pm.     levofloxacin 500 MG tablet  Commonly known as:  LEVAQUIN  Take 1 tablet (500 mg total) by mouth daily.     metFORMIN 1000 MG tablet  Commonly known as:  GLUCOPHAGE  Take 500 mg by mouth 3 (three) times daily with meals. Take one tablet in am,1/2 tablet midday, and 1 tablet in evening.     nicotine 14 mg/24hr patch  Commonly known as:  EQL NICOTINE  Place 1 patch (14 mg total) onto the skin daily.     OVER THE COUNTER MEDICATION  Place 2 sprays into the nose 2 (two) times daily as needed (sinuses).     pantoprazole 40 MG tablet  Commonly known as:  PROTONIX  Take 1 tablet (40 mg  total) by mouth daily at 12 noon.     predniSONE 10 MG tablet  Commonly known as:  DELTASONE  - Take 4 tablets (40 mg) daily for 2 days, then,  - Take 3 tablets (30 mg) daily for 2 days, then,  - Take 2 tablets (20 mg) daily for 2 days, then,  - Take 1 tablets (10 mg) daily for 1 days, then stop     tiotropium 18 MCG inhalation capsule  Commonly known as:  SPIRIVA HANDIHALER  Place 1 capsule (18 mcg total) into inhaler and inhale daily.     vardenafil 10 MG tablet  Commonly known as:  LEVITRA  Take 10 mg by mouth daily as needed for erectile dysfunction.        ALLERGIES:   Allergies  Allergen Reactions  . Amoxicillin-Pot Clavulanate Other (See Comments)    Severe stomach pain.   . Quinapril Hcl     REACTION: back pain  . Valsartan     REACTION: back pain  . Varenicline Tartrate     REACTION: AMS, hallucinations, night mares    BRIEF HPI:  See H&P, Labs, Consult and Test reports for all details in brief,Jonathan [name] is a 73 y.o. male with PMH of COPD, Ongoing tobacco abuse, DM, HTN was sent to the ER by his PCP for evaluation of persistent cough, shortness of breath along with wheezing and subjective fever. He was thought to have a COPD exacerbation, and admitted to the hospitalist service for further evaluation and treatment.  CONSULTATIONS:   None  PERTINENT RADIOLOGIC STUDIES: Dg Chest 2 View  11/23/2013   CLINICAL DATA:  Shortness of breath and cough  EXAM: CHEST  2 VIEW  COMPARISON:  12/02/2011  FINDINGS: Mild interstitial coarsening which appears chronic. No evidence of consolidation in the lateral projection. No edema, effusion, or pneumothorax. Normal heart size.  IMPRESSION: Stable exam.  No edema or consolidation.   Electronically Signed   By: Tiburcio Pea M.D.   On: 11/23/2013 14:09     PERTINENT LAB RESULTS: CBC:  Recent Labs  11/23/13 1224 11/24/13 0417  WBC 12.8* 13.8*  HGB 18.0* 16.6  HCT 51.4 47.8  PLT 241 233   CMET CMP     Component Value Date/Time   NA 132* 11/24/2013 0417   K 4.6 11/24/2013 0417   CL 93* 11/24/2013 0417   CO2 26 11/24/2013 0417   GLUCOSE 267* 11/24/2013 0417   BUN 19 11/24/2013 0417   CREATININE 0.92 11/24/2013 0417   CALCIUM 9.5 11/24/2013 0417   PROT 7.5 05/24/2013 0932   ALBUMIN 4.2 05/24/2013 0932   AST 20 05/24/2013 0932   ALT 22 05/24/2013 0932   ALKPHOS 64 05/24/2013 0932   BILITOT 0.9 05/24/2013 0932   GFRNONAA 82* 11/24/2013 0417   GFRAA >90 11/24/2013 0417    GFR Estimated Creatinine Clearance: 75.9 ml/min (by C-G formula based on Cr of 0.92). No results found for this basename: LIPASE, AMYLASE,  in the last 72 hours No results found for this basename: CKTOTAL, CKMB, CKMBINDEX, TROPONINI,  in the last 72 hours No components found with this basename: POCBNP,  No results found for this basename: DDIMER,  in the last 72 hours No results found for this basename: HGBA1C,  in the last 72 hours No results found for this basename: CHOL, HDL,  LDLCALC, TRIG, CHOLHDL, LDLDIRECT,  in the last 72 hours No results found for this basename: TSH, T4TOTAL, FREET3, T3FREE, THYROIDAB,  in the last  72 hours No results found for this basename: VITAMINB12, FOLATE, FERRITIN, TIBC, IRON, RETICCTPCT,  in the last 72 hours Coags: No results found for this basename: PT, INR,  in the last 72 hours Microbiology: Recent Results (from the past 240 hour(s))  URINE CULTURE     Status: None   Collection Time    11/23/13  2:32 PM      Result Value Ref Range Status   Specimen Description URINE, CLEAN CATCH   Final   Special Requests NONE   Final   Culture  Setup Time     Final   Value: 11/23/2013 14:54     Performed at [facility]     Final   Value: NO GROWTH     Performed at [facility]   Culture     Final   Value: NO GROWTH     Performed at [facility]   Report Status 11/24/2013 FINAL   Final     BRIEF HOSPITAL COURSE:  COPD exacerbation  - Patient was admitted, started on Solu-Medrol, and scheduled nebulized bronchodilators and empiric Levaquin therapy. Influenza PCR was negative. With these measures, patient significantly improved. During this morning rounds, patient's lungs are completely clear, he is still coughing, but significantly better than on admission. It is thought that the patient has met maximum benefit from his inpatient stay, and is stable for discharge today. On discharge, he will be placed on tapering prednisone, Levaquin, and inhaler/nebulizer regimen as above. He will also require oxygen on discharge.He has been asked to followup with his primary care practitioner and primary pulmonologist within a week.    Nausea and vomiting  - Seems to have resolved. Was managed with supportive care.   Diabetes  - CBGs elevated likely secondary to steroids, but stable. Managed with with Lantus 40 units, and SSI.  -Resume regular insulin regimen, and oral hypoglycemic agents on discharge    Hypertension  - Controlled with amlodipine   Dyslipidemia  - Continue with statins   History of CVA - Continue aspirin, statins. Needs to stop smoking!  Tobacco use  - Counseled extensively.Will provide patient with nicotine transdermal the prescription on discharge  TODAY-DAY OF DISCHARGE:  Subjective:   Jonathan [name] today has no headache,no chest abdominal pain,no new weakness tingling or numbness, feels much better wants to go home today.   Objective:   Blood pressure 161/80, pulse 76, temperature 98.2 F (36.8 C), temperature source Oral, resp. rate 18, height _0  (1.727 m), weight 85 kg (187 lb 6.3 oz), SpO2 94.00%.  Intake/Output Summary (Last 24 hours) at 11/26/13 1011 Last data filed at 11/26/13 0600  Gross per 24 hour  Intake    580 ml  Output    650 ml  Net    -70 ml   Filed Weights   11/23/13 1212 11/23/13 1942 11/26/13 0500  Weight: 79.379 kg (175 lb) 84.4 kg (186 lb 1.1 oz) 85 kg (187 lb 6.3 oz)    Exam Awake Alert, Oriented *3, No new F.N deficits, Normal affect [place].AT,PERRAL Supple Neck,No JVD, No cervical lymphadenopathy appriciated.  Symmetrical Chest wall movement, Good air movement bilaterally, CTAB RRR,No Gallops,Rubs or new Murmurs, No Parasternal Heave +ve B.Sounds, Abd Soft, Non tender, No organomegaly appriciated, No rebound -guarding or rigidity. No Cyanosis, Clubbing or edema, No new Rash or bruise  DISCHARGE CONDITION: Stable  DISPOSITION: Home  DISCHARGE INSTRUCTIONS:    Activity:  As tolerated with Full fall precautions use  walker/cane & assistance as needed  Diet recommendation: Diabetic Diet Heart Healthy diet      Discharge Orders   Future Orders Complete By Expires   Call MD for:  difficulty breathing, headache or visual disturbances  As directed    Diet - low sodium heart healthy  As directed    Diet Carb Modified  As directed    Discharge instructions  As directed    Comments:     STOP SMOKING   Increase  activity slowly  As directed       Follow-up Information   Follow up with Loura Pardon, MD. Schedule an appointment as soon as possible for a visit in 1 week.   Specialties:  Family Medicine, Radiology   Contact information:   [address] [place] [address] [phone]       Follow up with Rigoberto Noel., MD. Schedule an appointment as soon as possible for a visit in 1 week.   Specialty:  Pulmonary Disease   Contact information:   [address] [place] [address] [phone]      Total Time spent on discharge equals 45 minutes.  SignedOren Binet 11/26/2013 10:11 AM

## 2013-11-26 NOTE — Progress Notes (Signed)
Jonathan [name] to be D/C'd Home per MD order.  Discussed with the patient and all questions fully answered.    Medication List         albuterol 108 (90 BASE) MCG/ACT inhaler  Commonly known as:  VENTOLIN HFA  Inhale 2 puffs into the lungs every 6 (six) hours as needed for wheezing.     albuterol (2.5 MG/3ML) 0.083% nebulizer solution  Commonly known as:  PROVENTIL  Take 3 mLs (2.5 mg total) by nebulization 2 (two) times daily as needed for shortness of breath.     amLODipine 5 MG tablet  Commonly known as:  NORVASC  Take 5 mg by mouth daily.     aspirin 81 MG tablet  Take 1 tablet (81 mg total) by mouth daily.     atorvastatin 10 MG tablet  Commonly known as:  LIPITOR  Take 10 mg by mouth daily.     chlorpheniramine-HYDROcodone 10-8 MG/5ML Lqcr  Commonly known as:  TUSSIONEX  Take 5 mLs by mouth every 12 (twelve) hours as needed for cough.     glipiZIDE 10 MG 24 hr tablet  Commonly known as:  GLIPIZIDE XL  Take 1 tablet (10 mg total) by mouth daily.     guaiFENesin 600 MG 12 hr tablet  Commonly known as:  MUCINEX  Take 1 tablet (600 mg total) by mouth 2 (two) times daily.     ibuprofen 200 MG tablet  Commonly known as:  ADVIL,MOTRIN  Take 400 mg by mouth every 6 (six) hours as needed (pain).     insulin glargine 100 units/mL Soln  Commonly known as:  LANTUS  Inject 40 Units into the skin daily at 8 pm.     levofloxacin 500 MG tablet  Commonly known as:  LEVAQUIN  Take 1 tablet (500 mg total) by mouth daily.     metFORMIN 1000 MG tablet  Commonly known as:  GLUCOPHAGE  Take 500 mg by mouth 3 (three) times daily with meals. Take one tablet in am,1/2 tablet midday, and 1 tablet in evening.     nicotine 14 mg/24hr patch  Commonly known as:  EQL NICOTINE  Place 1 patch (14 mg total) onto the skin daily.     OVER THE COUNTER MEDICATION  Place 2 sprays into the nose 2 (two) times daily as needed (sinuses).     pantoprazole 40 MG tablet  Commonly known as:   PROTONIX  Take 1 tablet (40 mg total) by mouth daily at 12 noon.     predniSONE 10 MG tablet  Commonly known as:  DELTASONE  - Take 4 tablets (40 mg) daily for 2 days, then,  - Take 3 tablets (30 mg) daily for 2 days, then,  - Take 2 tablets (20 mg) daily for 2 days, then,  - Take 1 tablets (10 mg) daily for 1 days, then stop     tiotropium 18 MCG inhalation capsule  Commonly known as:  SPIRIVA HANDIHALER  Place 1 capsule (18 mcg total) into inhaler and inhale daily.     vardenafil 10 MG tablet  Commonly known as:  LEVITRA  Take 10 mg by mouth daily as needed for erectile dysfunction.        VVS, Skin clean, dry and intact without evidence of skin break down, no evidence of skin tears noted. IV catheter discontinued intact. Site without signs and symptoms of complications. Dressing and pressure applied.  An After Visit Summary was printed and given to the patient.  D/c education completed with patient/family including follow up instructions, medication list, d/c activities limitations if indicated, with other d/c instructions as indicated by MD - patient able to verbalize understanding, all questions fully answered.   Patient instructed to return to ED, call 911, or call MD for any changes in condition.   Patient escorted via IXL, and D/C home via private auto.  Delman Cheadle 11/26/2013 5:00 PM

## 2013-11-26 NOTE — Progress Notes (Signed)
.  SATURATION QUALIFICATIONS: (This note is used to comply with regulatory documentation for home oxygen)  Patient Saturations on Room Air at Rest = 89%  Patient Saturations on Room Air while Ambulating = 83%  Patient Saturations on 2 Liters of oxygen while Ambulating = 91%  Please briefly explain why patient needs home oxygen: patient desats when ambulating requiring 2L of 02

## 2013-11-28 ENCOUNTER — Inpatient Hospital Stay (HOSPITAL_COMMUNITY): Payer: Medicare Other

## 2013-11-28 ENCOUNTER — Encounter (HOSPITAL_COMMUNITY): Payer: Self-pay | Admitting: Emergency Medicine

## 2013-11-28 ENCOUNTER — Emergency Department (HOSPITAL_COMMUNITY): Payer: Medicare Other

## 2013-11-28 ENCOUNTER — Inpatient Hospital Stay (HOSPITAL_COMMUNITY)
Admission: EM | Admit: 2013-11-28 | Discharge: 2013-12-08 | DRG: 189 | Disposition: A | Discharge status: Home / Self Care | Payer: Medicare Other | Attending: Pulmonary Disease | Admitting: Pulmonary Disease

## 2013-11-28 DIAGNOSIS — T380X5A Adverse effect of glucocorticoids and synthetic analogues, initial encounter: Secondary | ICD-10-CM | POA: Diagnosis present

## 2013-11-28 DIAGNOSIS — Z79899 Other long term (current) drug therapy: Secondary | ICD-10-CM

## 2013-11-28 DIAGNOSIS — Z7982 Long term (current) use of aspirin: Secondary | ICD-10-CM

## 2013-11-28 DIAGNOSIS — J440 Chronic obstructive pulmonary disease with acute lower respiratory infection: Secondary | ICD-10-CM

## 2013-11-28 DIAGNOSIS — J441 Chronic obstructive pulmonary disease with (acute) exacerbation: Secondary | ICD-10-CM

## 2013-11-28 DIAGNOSIS — R7881 Bacteremia: Secondary | ICD-10-CM

## 2013-11-28 DIAGNOSIS — J4489 Other specified chronic obstructive pulmonary disease: Secondary | ICD-10-CM

## 2013-11-28 DIAGNOSIS — J45901 Unspecified asthma with (acute) exacerbation: Secondary | ICD-10-CM

## 2013-11-28 DIAGNOSIS — Z720 Tobacco use: Secondary | ICD-10-CM

## 2013-11-28 DIAGNOSIS — L219 Seborrheic dermatitis, unspecified: Secondary | ICD-10-CM

## 2013-11-28 DIAGNOSIS — R32 Unspecified urinary incontinence: Secondary | ICD-10-CM | POA: Diagnosis present

## 2013-11-28 DIAGNOSIS — I10 Essential (primary) hypertension: Secondary | ICD-10-CM

## 2013-11-28 DIAGNOSIS — R509 Fever, unspecified: Secondary | ICD-10-CM

## 2013-11-28 DIAGNOSIS — I959 Hypotension, unspecified: Secondary | ICD-10-CM | POA: Diagnosis present

## 2013-11-28 DIAGNOSIS — F528 Other sexual dysfunction not due to a substance or known physiological condition: Secondary | ICD-10-CM

## 2013-11-28 DIAGNOSIS — B957 Other staphylococcus as the cause of diseases classified elsewhere: Secondary | ICD-10-CM | POA: Diagnosis present

## 2013-11-28 DIAGNOSIS — Z9981 Dependence on supplemental oxygen: Secondary | ICD-10-CM

## 2013-11-28 DIAGNOSIS — G9341 Metabolic encephalopathy: Secondary | ICD-10-CM | POA: Diagnosis present

## 2013-11-28 DIAGNOSIS — W010XXA Fall on same level from slipping, tripping and stumbling without subsequent striking against object, initial encounter: Secondary | ICD-10-CM | POA: Diagnosis present

## 2013-11-28 DIAGNOSIS — Z794 Long term (current) use of insulin: Secondary | ICD-10-CM

## 2013-11-28 DIAGNOSIS — R911 Solitary pulmonary nodule: Secondary | ICD-10-CM

## 2013-11-28 DIAGNOSIS — R55 Syncope and collapse: Secondary | ICD-10-CM

## 2013-11-28 DIAGNOSIS — Z2821 Immunization not carried out because of patient refusal: Secondary | ICD-10-CM

## 2013-11-28 DIAGNOSIS — N529 Male erectile dysfunction, unspecified: Secondary | ICD-10-CM

## 2013-11-28 DIAGNOSIS — N209 Urinary calculus, unspecified: Secondary | ICD-10-CM

## 2013-11-28 DIAGNOSIS — I639 Cerebral infarction, unspecified: Secondary | ICD-10-CM

## 2013-11-28 DIAGNOSIS — S46009A Unspecified injury of muscle(s) and tendon(s) of the rotator cuff of unspecified shoulder, initial encounter: Secondary | ICD-10-CM

## 2013-11-28 DIAGNOSIS — J9601 Acute respiratory failure with hypoxia: Secondary | ICD-10-CM | POA: Diagnosis present

## 2013-11-28 DIAGNOSIS — G9340 Encephalopathy, unspecified: Secondary | ICD-10-CM

## 2013-11-28 DIAGNOSIS — J9690 Respiratory failure, unspecified, unspecified whether with hypoxia or hypercapnia: Secondary | ICD-10-CM

## 2013-11-28 DIAGNOSIS — R404 Transient alteration of awareness: Secondary | ICD-10-CM | POA: Diagnosis present

## 2013-11-28 DIAGNOSIS — M7500 Adhesive capsulitis of unspecified shoulder: Secondary | ICD-10-CM

## 2013-11-28 DIAGNOSIS — E875 Hyperkalemia: Secondary | ICD-10-CM

## 2013-11-28 DIAGNOSIS — IMO0002 Reserved for concepts with insufficient information to code with codable children: Secondary | ICD-10-CM

## 2013-11-28 DIAGNOSIS — IMO0001 Reserved for inherently not codable concepts without codable children: Secondary | ICD-10-CM

## 2013-11-28 DIAGNOSIS — J9620 Acute and chronic respiratory failure, unspecified whether with hypoxia or hypercapnia: Principal | ICD-10-CM | POA: Diagnosis present

## 2013-11-28 DIAGNOSIS — G459 Transient cerebral ischemic attack, unspecified: Secondary | ICD-10-CM

## 2013-11-28 DIAGNOSIS — R5381 Other malaise: Secondary | ICD-10-CM | POA: Diagnosis present

## 2013-11-28 DIAGNOSIS — Z8673 Personal history of transient ischemic attack (TIA), and cerebral infarction without residual deficits: Secondary | ICD-10-CM

## 2013-11-28 DIAGNOSIS — E871 Hypo-osmolality and hyponatremia: Secondary | ICD-10-CM | POA: Diagnosis not present

## 2013-11-28 DIAGNOSIS — F4321 Adjustment disorder with depressed mood: Secondary | ICD-10-CM

## 2013-11-28 DIAGNOSIS — D45 Polycythemia vera: Secondary | ICD-10-CM | POA: Diagnosis present

## 2013-11-28 DIAGNOSIS — N200 Calculus of kidney: Secondary | ICD-10-CM

## 2013-11-28 DIAGNOSIS — L259 Unspecified contact dermatitis, unspecified cause: Secondary | ICD-10-CM | POA: Diagnosis present

## 2013-11-28 DIAGNOSIS — F17200 Nicotine dependence, unspecified, uncomplicated: Secondary | ICD-10-CM | POA: Diagnosis present

## 2013-11-28 DIAGNOSIS — E1165 Type 2 diabetes mellitus with hyperglycemia: Secondary | ICD-10-CM

## 2013-11-28 DIAGNOSIS — M25519 Pain in unspecified shoulder: Secondary | ICD-10-CM

## 2013-11-28 DIAGNOSIS — J449 Chronic obstructive pulmonary disease, unspecified: Secondary | ICD-10-CM

## 2013-11-28 DIAGNOSIS — E119 Type 2 diabetes mellitus without complications: Secondary | ICD-10-CM | POA: Diagnosis present

## 2013-11-28 DIAGNOSIS — J9600 Acute respiratory failure, unspecified whether with hypoxia or hypercapnia: Secondary | ICD-10-CM

## 2013-11-28 DIAGNOSIS — N390 Urinary tract infection, site not specified: Secondary | ICD-10-CM | POA: Diagnosis present

## 2013-11-28 DIAGNOSIS — F29 Unspecified psychosis not due to a substance or known physiological condition: Secondary | ICD-10-CM | POA: Diagnosis present

## 2013-11-28 DIAGNOSIS — E785 Hyperlipidemia, unspecified: Secondary | ICD-10-CM

## 2013-11-28 HISTORY — DX: Cerebral infarction, unspecified: I63.9

## 2013-11-28 LAB — CBC WITH DIFFERENTIAL/PLATELET
Basophils Absolute: 0 10*3/uL (ref 0.0–0.1)
Basophils Relative: 0 % (ref 0–1)
Eosinophils Absolute: 0 10*3/uL (ref 0.0–0.7)
Eosinophils Relative: 0 % (ref 0–5)
HCT: 48.7 % (ref 39.0–52.0)
Hemoglobin: 16.4 g/dL (ref 13.0–17.0)
Lymphocytes Relative: 9 % — ABNORMAL LOW (ref 12–46)
Lymphs Abs: 1.2 10*3/uL (ref 0.7–4.0)
MCH: 29.5 pg (ref 26.0–34.0)
MCHC: 33.7 g/dL (ref 30.0–36.0)
MCV: 87.6 fL (ref 78.0–100.0)
Monocytes Absolute: 0.2 10*3/uL (ref 0.1–1.0)
Monocytes Relative: 1 % — ABNORMAL LOW (ref 3–12)
Neutro Abs: 13 10*3/uL — ABNORMAL HIGH (ref 1.7–7.7)
Neutrophils Relative %: 90 % — ABNORMAL HIGH (ref 43–77)
Platelets: 196 10*3/uL (ref 150–400)
RBC: 5.56 MIL/uL (ref 4.22–5.81)
RDW: 13.4 % (ref 11.5–15.5)
WBC: 14.4 10*3/uL — ABNORMAL HIGH (ref 4.0–10.5)

## 2013-11-28 LAB — LACTIC ACID, PLASMA
Lactic Acid, Venous: 3.8 mmol/L — ABNORMAL HIGH (ref 0.5–2.2)
Lactic Acid, Venous: 4.4 mmol/L — ABNORMAL HIGH (ref 0.5–2.2)

## 2013-11-28 LAB — TROPONIN I
Troponin I: <0.3 ng/mL (ref <0.30)
Troponin I: <0.3 ng/mL (ref <0.30)

## 2013-11-28 LAB — BASIC METABOLIC PANEL
BUN: 18 mg/dL (ref 6–23)
CO2: 27 mEq/L (ref 19–32)
Calcium: 8.2 mg/dL — ABNORMAL LOW (ref 8.4–10.5)
Chloride: 93 mEq/L — ABNORMAL LOW (ref 96–112)
Creatinine, Ser: 0.89 mg/dL (ref 0.50–1.35)
GFR calc Af Amer: >90 mL/min (ref >90)
GFR calc non Af Amer: 83 mL/min — ABNORMAL LOW (ref >90)
Glucose, Bld: 261 mg/dL — ABNORMAL HIGH (ref 70–99)
Potassium: 4.4 mEq/L (ref 3.7–5.3)
Sodium: 133 mEq/L — ABNORMAL LOW (ref 137–147)

## 2013-11-28 LAB — CBC
HCT: 49.2 % (ref 39.0–52.0)
HCT: 48.8 % (ref 39.0–52.0)
Hemoglobin: 17.1 g/dL — ABNORMAL HIGH (ref 13.0–17.0)
Hemoglobin: 16.6 g/dL (ref 13.0–17.0)
MCH: 30.6 pg (ref 26.0–34.0)
MCH: 29.8 pg (ref 26.0–34.0)
MCHC: 34.8 g/dL (ref 30.0–36.0)
MCHC: 34 g/dL (ref 30.0–36.0)
MCV: 88 fL (ref 78.0–100.0)
MCV: 87.6 fL (ref 78.0–100.0)
Platelets: 198 10*3/uL (ref 150–400)
Platelets: 236 10*3/uL (ref 150–400)
RBC: 5.59 MIL/uL (ref 4.22–5.81)
RBC: 5.57 MIL/uL (ref 4.22–5.81)
RDW: 13.3 % (ref 11.5–15.5)
RDW: 13.3 % (ref 11.5–15.5)
WBC: 16.1 10*3/uL — ABNORMAL HIGH (ref 4.0–10.5)
WBC: 14.5 10*3/uL — ABNORMAL HIGH (ref 4.0–10.5)

## 2013-11-28 LAB — HEMOGLOBIN A1C
Hgb A1c MFr Bld: 8.4 % — ABNORMAL HIGH (ref <5.7)
Mean Plasma Glucose: 194 mg/dL — ABNORMAL HIGH (ref <117)

## 2013-11-28 LAB — I-STAT ARTERIAL BLOOD GAS, ED
Acid-Base Excess: 4 mmol/L — ABNORMAL HIGH (ref 0.0–2.0)
Bicarbonate: 29.6 mEq/L — ABNORMAL HIGH (ref 20.0–24.0)
O2 Saturation: 99 %
Patient temperature: 98.6
TCO2: 31 mmol/L (ref 0–100)
pCO2 arterial: 47.8 mmHg — ABNORMAL HIGH (ref 35.0–45.0)
pH, Arterial: 7.399 (ref 7.350–7.450)
pO2, Arterial: 157 mmHg — ABNORMAL HIGH (ref 80.0–100.0)

## 2013-11-28 LAB — GLUCOSE, CAPILLARY
Glucose-Capillary: 338 mg/dL — ABNORMAL HIGH (ref 70–99)
Glucose-Capillary: 321 mg/dL — ABNORMAL HIGH (ref 70–99)
Glucose-Capillary: 301 mg/dL — ABNORMAL HIGH (ref 70–99)
Glucose-Capillary: 146 mg/dL — ABNORMAL HIGH (ref 70–99)

## 2013-11-28 LAB — INFLUENZA PANEL BY PCR (TYPE A & B)
H1N1 flu by pcr: NOT DETECTED
Influenza A By PCR: NEGATIVE
Influenza B By PCR: NEGATIVE

## 2013-11-28 LAB — CK: Total CK: 127 U/L (ref 7–232)

## 2013-11-28 LAB — URINALYSIS, ROUTINE W REFLEX MICROSCOPIC
Bilirubin Urine: NEGATIVE
Glucose, UA: >1000 mg/dL — AB
Hgb urine dipstick: NEGATIVE
Ketones, ur: 15 mg/dL — AB
Leukocytes, UA: NEGATIVE
Nitrite: NEGATIVE
Protein, ur: 30 mg/dL — AB
Specific Gravity, Urine: 1.037 — ABNORMAL HIGH (ref 1.005–1.030)
Urobilinogen, UA: 0.2 mg/dL (ref 0.0–1.0)
pH: 5.5 (ref 5.0–8.0)

## 2013-11-28 LAB — COMPREHENSIVE METABOLIC PANEL
ALT: 31 U/L (ref 0–53)
ALT: 30 U/L (ref 0–53)
AST: 24 U/L (ref 0–37)
AST: 23 U/L (ref 0–37)
Albumin: 3.4 g/dL — ABNORMAL LOW (ref 3.5–5.2)
Albumin: 3.4 g/dL — ABNORMAL LOW (ref 3.5–5.2)
Alkaline Phosphatase: 55 U/L (ref 39–117)
Alkaline Phosphatase: 57 U/L (ref 39–117)
BUN: 20 mg/dL (ref 6–23)
BUN: 20 mg/dL (ref 6–23)
CO2: 25 mEq/L (ref 19–32)
CO2: 25 mEq/L (ref 19–32)
Calcium: 8.2 mg/dL — ABNORMAL LOW (ref 8.4–10.5)
Calcium: 8.4 mg/dL (ref 8.4–10.5)
Chloride: 94 mEq/L — ABNORMAL LOW (ref 96–112)
Chloride: 92 mEq/L — ABNORMAL LOW (ref 96–112)
Creatinine, Ser: 0.99 mg/dL (ref 0.50–1.35)
Creatinine, Ser: 0.92 mg/dL (ref 0.50–1.35)
GFR calc Af Amer: >90 mL/min (ref >90)
GFR calc Af Amer: >90 mL/min (ref >90)
GFR calc non Af Amer: 79 mL/min — ABNORMAL LOW (ref >90)
GFR calc non Af Amer: 82 mL/min — ABNORMAL LOW (ref >90)
Glucose, Bld: 370 mg/dL — ABNORMAL HIGH (ref 70–99)
Glucose, Bld: 371 mg/dL — ABNORMAL HIGH (ref 70–99)
Potassium: 4.4 mEq/L (ref 3.7–5.3)
Potassium: 4.2 mEq/L (ref 3.7–5.3)
Sodium: 138 mEq/L (ref 137–147)
Sodium: 134 mEq/L — ABNORMAL LOW (ref 137–147)
Total Bilirubin: 1.3 mg/dL — ABNORMAL HIGH (ref 0.3–1.2)
Total Bilirubin: 1.3 mg/dL — ABNORMAL HIGH (ref 0.3–1.2)
Total Protein: 6.6 g/dL (ref 6.0–8.3)
Total Protein: 6.5 g/dL (ref 6.0–8.3)

## 2013-11-28 LAB — URINE MICROSCOPIC-ADD ON

## 2013-11-28 LAB — RAPID URINE DRUG SCREEN, HOSP PERFORMED
Amphetamines: NOT DETECTED
Barbiturates: NOT DETECTED
Benzodiazepines: NOT DETECTED
Cocaine: NOT DETECTED
Opiates: NOT DETECTED
Tetrahydrocannabinol: NOT DETECTED

## 2013-11-28 LAB — STREP PNEUMONIAE URINARY ANTIGEN: Strep Pneumo Urinary Antigen: NEGATIVE

## 2013-11-28 LAB — APTT: aPTT: 26 seconds (ref 24–37)

## 2013-11-28 LAB — MAGNESIUM: Magnesium: 2.1 mg/dL (ref 1.5–2.5)

## 2013-11-28 LAB — PROCALCITONIN: Procalcitonin: 0.14 ng/mL

## 2013-11-28 LAB — TSH: TSH: 0.447 u[IU]/mL (ref 0.350–4.500)

## 2013-11-28 LAB — PROTIME-INR
INR: 1.04 (ref 0.00–1.49)
Prothrombin Time: 13.4 seconds (ref 11.6–15.2)

## 2013-11-28 LAB — CORTISOL: Cortisol, Plasma: 24.4 ug/dL

## 2013-11-28 LAB — MRSA PCR SCREENING: MRSA by PCR: NEGATIVE

## 2013-11-28 LAB — PRO B NATRIURETIC PEPTIDE: Pro B Natriuretic peptide (BNP): 463.5 pg/mL — ABNORMAL HIGH (ref 0–125)

## 2013-11-28 LAB — PHOSPHORUS: Phosphorus: 3.3 mg/dL (ref 2.3–4.6)

## 2013-11-28 MED ORDER — ASPIRIN 325 MG PO TABS
325.0000 mg | ORAL_TABLET | Freq: Every day | ORAL | Status: DC
  Administered 2013-11-28 – 2013-12-08 (×11): 325 mg via ORAL
  Filled 2013-11-28 (×13): qty 1

## 2013-11-28 MED ORDER — DEXTROSE 5 % IV SOLN
1.0000 g | Freq: Three times a day (TID) | INTRAVENOUS | Status: DC
  Administered 2013-11-28 – 2013-11-29 (×3): 1 g via INTRAVENOUS
  Filled 2013-11-28 (×6): qty 1

## 2013-11-28 MED ORDER — METHYLPREDNISOLONE SODIUM SUCC 40 MG IJ SOLR
40.0000 mg | Freq: Three times a day (TID) | INTRAMUSCULAR | Status: DC
  Administered 2013-11-28 – 2013-11-30 (×7): 40 mg via INTRAVENOUS
  Filled 2013-11-28 (×9): qty 1

## 2013-11-28 MED ORDER — IOHEXOL 350 MG/ML SOLN
100.0000 mL | Freq: Once | INTRAVENOUS | Status: AC | PRN
  Administered 2013-11-28: 100 mL via INTRAVENOUS

## 2013-11-28 MED ORDER — VANCOMYCIN HCL IN DEXTROSE 1-5 GM/200ML-% IV SOLN: 1000.0000 mg | Freq: Once | INTRAVENOUS | Status: DC

## 2013-11-28 MED ORDER — INSULIN ASPART 100 UNIT/ML ~~LOC~~ SOLN
0.0000 [IU] | SUBCUTANEOUS | Status: DC
  Administered 2013-11-28 (×2): 11 [IU] via SUBCUTANEOUS
  Administered 2013-11-28: 2 [IU] via SUBCUTANEOUS
  Administered 2013-11-29 (×2): 3 [IU] via SUBCUTANEOUS
  Administered 2013-11-29: 5 [IU] via SUBCUTANEOUS
  Administered 2013-11-29: 3 [IU] via SUBCUTANEOUS

## 2013-11-28 MED ORDER — ALBUTEROL SULFATE (2.5 MG/3ML) 0.083% IN NEBU
2.5000 mg | INHALATION_SOLUTION | Freq: Four times a day (QID) | RESPIRATORY_TRACT | Status: DC
  Administered 2013-11-28: 2.5 mg via RESPIRATORY_TRACT
  Filled 2013-11-28: qty 3

## 2013-11-28 MED ORDER — ATORVASTATIN CALCIUM 10 MG PO TABS
10.0000 mg | ORAL_TABLET | Freq: Every day | ORAL | Status: DC
  Administered 2013-11-28 – 2013-12-08 (×11): 10 mg via ORAL
  Filled 2013-11-28 (×12): qty 1

## 2013-11-28 MED ORDER — INSULIN GLARGINE 100 UNIT/ML ~~LOC~~ SOLN
40.0000 [IU] | Freq: Every day | SUBCUTANEOUS | Status: DC
  Filled 2013-11-28: qty 0.4

## 2013-11-28 MED ORDER — ENOXAPARIN SODIUM 40 MG/0.4ML ~~LOC~~ SOLN: 40.0000 mg | SUBCUTANEOUS | Status: DC

## 2013-11-28 MED ORDER — ALBUTEROL SULFATE (2.5 MG/3ML) 0.083% IN NEBU: 2.5000 mg | INHALATION_SOLUTION | RESPIRATORY_TRACT | Status: DC | PRN

## 2013-11-28 MED ORDER — PANTOPRAZOLE SODIUM 40 MG IV SOLR
40.0000 mg | INTRAVENOUS | Status: DC
  Filled 2013-11-28: qty 40

## 2013-11-28 MED ORDER — INSULIN ASPART 100 UNIT/ML ~~LOC~~ SOLN: 0.0000 [IU] | Freq: Three times a day (TID) | SUBCUTANEOUS | Status: DC

## 2013-11-28 MED ORDER — IPRATROPIUM BROMIDE 0.02 % IN SOLN
0.5000 mg | Freq: Four times a day (QID) | RESPIRATORY_TRACT | Status: DC
  Administered 2013-11-28: 0.5 mg via RESPIRATORY_TRACT
  Filled 2013-11-28: qty 2.5

## 2013-11-28 MED ORDER — PANTOPRAZOLE SODIUM 40 MG PO TBEC: 40.0000 mg | DELAYED_RELEASE_TABLET | Freq: Every day | ORAL | Status: DC

## 2013-11-28 MED ORDER — PANTOPRAZOLE SODIUM 40 MG IV SOLR
40.0000 mg | Freq: Every day | INTRAVENOUS | Status: DC
  Filled 2013-11-28 (×2): qty 40

## 2013-11-28 MED ORDER — ALBUTEROL (5 MG/ML) CONTINUOUS INHALATION SOLN
10.0000 mg/h | INHALATION_SOLUTION | RESPIRATORY_TRACT | Status: DC
  Administered 2013-11-28: 10 mg/h via RESPIRATORY_TRACT
  Filled 2013-11-28: qty 20

## 2013-11-28 MED ORDER — IPRATROPIUM-ALBUTEROL 0.5-2.5 (3) MG/3ML IN SOLN
3.0000 mL | RESPIRATORY_TRACT | Status: DC
  Administered 2013-11-28 – 2013-11-29 (×6): 3 mL via RESPIRATORY_TRACT
  Filled 2013-11-28 (×6): qty 3

## 2013-11-28 MED ORDER — ALBUTEROL SULFATE (2.5 MG/3ML) 0.083% IN NEBU
2.5000 mg | INHALATION_SOLUTION | RESPIRATORY_TRACT | Status: DC
  Administered 2013-11-28: 2.5 mg via RESPIRATORY_TRACT
  Filled 2013-11-28: qty 3

## 2013-11-28 MED ORDER — METHYLPREDNISOLONE SODIUM SUCC 125 MG IJ SOLR
125.0000 mg | Freq: Once | INTRAMUSCULAR | Status: AC
  Administered 2013-11-28: 125 mg via INTRAVENOUS
  Filled 2013-11-28: qty 2

## 2013-11-28 MED ORDER — IPRATROPIUM BROMIDE 0.02 % IN SOLN
0.5000 mg | RESPIRATORY_TRACT | Status: DC
  Administered 2013-11-28: 0.5 mg via RESPIRATORY_TRACT
  Filled 2013-11-28: qty 2.5

## 2013-11-28 MED ORDER — HEPARIN SODIUM (PORCINE) 5000 UNIT/ML IJ SOLN
5000.0000 [IU] | Freq: Three times a day (TID) | INTRAMUSCULAR | Status: DC
  Administered 2013-11-28 – 2013-12-02 (×15): 5000 [IU] via SUBCUTANEOUS
  Filled 2013-11-28 (×15): qty 1

## 2013-11-28 MED ORDER — GLIPIZIDE ER 10 MG PO TB24: 10.0000 mg | ORAL_TABLET | Freq: Every day | ORAL | Status: DC

## 2013-11-28 MED ORDER — BUDESONIDE 0.25 MG/2ML IN SUSP
0.2500 mg | Freq: Two times a day (BID) | RESPIRATORY_TRACT | Status: DC
  Administered 2013-11-28 – 2013-11-30 (×4): 0.25 mg via RESPIRATORY_TRACT
  Filled 2013-11-28 (×7): qty 2

## 2013-11-28 MED ORDER — SODIUM CHLORIDE 0.9 % IV SOLN
INTRAVENOUS | Status: DC
  Administered 2013-11-30: 10 mL/h via INTRAVENOUS
  Administered 2013-12-01: 20 mL/h via INTRAVENOUS
  Administered 2013-12-02: 13:00:00 via INTRAVENOUS

## 2013-11-28 MED ORDER — INSULIN GLARGINE 100 UNITS/ML SOLOSTAR PEN: 40.0000 [IU] | PEN_INJECTOR | Freq: Every day | SUBCUTANEOUS | Status: DC

## 2013-11-28 MED ORDER — SODIUM CHLORIDE 0.9 % IV SOLN
INTRAVENOUS | Status: DC
  Administered 2013-11-28 – 2013-11-29 (×2): via INTRAVENOUS

## 2013-11-28 MED ORDER — IPRATROPIUM BROMIDE 0.02 % IN SOLN
0.5000 mg | Freq: Once | RESPIRATORY_TRACT | Status: AC
  Administered 2013-11-28: 0.5 mg via RESPIRATORY_TRACT
  Filled 2013-11-28: qty 2.5

## 2013-11-28 MED ORDER — METHYLPREDNISOLONE SODIUM SUCC 40 MG IJ SOLR
40.0000 mg | Freq: Three times a day (TID) | INTRAMUSCULAR | Status: DC
  Filled 2013-11-28 (×3): qty 1

## 2013-11-28 MED ORDER — ACETAMINOPHEN 325 MG PO TABS
650.0000 mg | ORAL_TABLET | ORAL | Status: DC | PRN
  Administered 2013-11-28 – 2013-12-01 (×5): 650 mg via ORAL
  Filled 2013-11-28 (×6): qty 2

## 2013-11-28 MED ORDER — VANCOMYCIN HCL IN DEXTROSE 1-5 GM/200ML-% IV SOLN
1000.0000 mg | Freq: Two times a day (BID) | INTRAVENOUS | Status: DC
  Administered 2013-11-28 – 2013-11-29 (×2): 1000 mg via INTRAVENOUS
  Filled 2013-11-28 (×3): qty 200

## 2013-11-28 MED ORDER — AMLODIPINE BESYLATE 5 MG PO TABS
5.0000 mg | ORAL_TABLET | Freq: Every day | ORAL | Status: DC
  Administered 2013-11-28 – 2013-12-08 (×11): 5 mg via ORAL
  Filled 2013-11-28 (×11): qty 1

## 2013-11-28 NOTE — H&P (Signed)
Jonathan [name] History and Physical  Jonathan [name]:[id number] DOB: [birth year] DOA: 11/28/2013  Referring physician: ER physician. PCP: Jonathan Pardon, MD   History obtained from ER physician family and previous records.  Chief Complaint: Loss of consciousness and shortness of breath.  HPI: Jonathan [name] is a 73 y.o. male history of COPD who was discharged 2 days ago was brought to the ER after patient's family found patient unconscious in the bathroom. Patient since discharge has been feeling short of breath and last night patient's family heard a sound when they checked patient was found unconscious in the bathtub. Patient had urinated on himself. After she insufflator patient regained his consciousness and when patient's family checked his blood sugar it was around 197. EMS was called and patient was brought to the ER. In the ER patient was found to be short of breath and was placed on BiPAP. In the ER patient was also found to be febrile with periods of confusion. Patient is complaining of pain in the forehead. CT head was done which did not show anything acute except for parotid gland enlargement. Patient is able to move all extremities denies any chest pain nausea vomiting abdominal pain or dysuria or diarrhea. Patient had one more episode of incontinence of urine ER. There was no any seizure-like activities.   Review of Systems: As presented in the history of presenting illness, rest negative.  Past Medical History  Diagnosis Date  . HTN (hypertension)   . HLD (hyperlipidemia)   . Pulmonary nodule, right     lower lobe  . COPD (chronic obstructive pulmonary disease)   . Rotator cuff injury     right  . Frozen shoulder   . Shoulder pain   . Hyperkalemia   . Dermatitis seborrheica   . Other diseases of lung, not elsewhere classified   . Kidney stone   . Allergic rhinitis   . ED (erectile dysfunction)   . Diabetes mellitus type II   . Asthma   . Tobacco abuse    Past  Surgical History  Procedure Laterality Date  . Cervical discectomy  1998  . Ett  11/1994  . Tm repair    . Tee without cardioversion  12/03/2011    Procedure: TRANSESOPHAGEAL ECHOCARDIOGRAM (TEE);  Surgeon: Lelon Perla, MD;  Location: [facility] ENDOSCOPY;  Service: Cardiovascular;  Laterality: N/A;   Social History:  reports that he has been smoking Cigarettes.  He has been smoking about 0.20 packs per day. He has never used smokeless tobacco. He reports that he does not drink alcohol or use illicit drugs. Where does patient live home. Can patient participate in ADLs? Yes.  Allergies  Allergen Reactions  . Amoxicillin-Pot Clavulanate Other (See Comments)    Severe stomach pain.   . Quinapril Hcl     REACTION: back pain  . Valsartan     REACTION: back pain  . Varenicline Tartrate     REACTION: AMS, hallucinations, night mares    Family History:  Family History  Problem Relation Age of Onset  . Asthma Sister   . Emphysema Sister       Prior to Admission medications   Medication Sig Start Date End Date Taking? Authorizing Provider  albuterol (PROVENTIL) (2.5 MG/3ML) 0.083% nebulizer solution Take 3 mLs (2.5 mg total) by nebulization 2 (two) times daily as needed for shortness of breath. 11/26/13  Yes Jonathan Kristeen Mans, MD  albuterol (VENTOLIN HFA) 108 (90 BASE) MCG/ACT inhaler Inhale 2 puffs into the  lungs every 6 (six) hours as needed for wheezing. 11/22/12  Yes Jonathan Greenspan, MD  amLODipine (NORVASC) 5 MG tablet Take 5 mg by mouth daily.   Yes Historical Provider, MD  aspirin 81 MG tablet Take 1 tablet (81 mg total) by mouth daily. 11/26/13  Yes Jonathan Kristeen Mans, MD  atorvastatin (LIPITOR) 10 MG tablet Take 10 mg by mouth daily.   Yes Historical Provider, MD  glipiZIDE (GLIPIZIDE XL) 10 MG 24 hr tablet Take 1 tablet (10 mg total) by mouth daily. 09/28/13  Yes Jonathan Greenspan, MD  ibuprofen (ADVIL,MOTRIN) 200 MG tablet Take 400 mg by mouth every 6 (six) hours as needed (pain).    Yes  Historical Provider, MD  insulin glargine (LANTUS) 100 units/mL SOLN Inject 40 Units into the skin daily at 8 pm.    Yes Historical Provider, MD  levofloxacin (LEVAQUIN) 500 MG tablet Take 1 tablet (500 mg total) by mouth daily. 11/26/13  Yes Jonathan Kristeen Mans, MD  metFORMIN (GLUCOPHAGE) 1000 MG tablet Take 500-1,000 mg by mouth See admin instructions. Take 1 tablet in the morning, 0.5 tablet midday and 1 tablet in the evening   Yes Historical Provider, MD  OVER THE COUNTER MEDICATION Place 2 sprays into the nose 2 (two) times daily as needed (sinuses).   Yes Historical Provider, MD  chlorpheniramine-HYDROcodone (TUSSIONEX) 10-8 MG/5ML LQCR Take 5 mLs by mouth every 12 (twelve) hours as needed for cough. 11/26/13   Jonathan Kristeen Mans, MD  guaiFENesin (MUCINEX) 600 MG 12 hr tablet Take 1 tablet (600 mg total) by mouth 2 (two) times daily. 11/26/13   Jonathan Kristeen Mans, MD  nicotine (EQL NICOTINE) 14 mg/24hr patch Place 1 patch (14 mg total) onto the skin daily. 11/26/13   Jonathan Kristeen Mans, MD  pantoprazole (PROTONIX) 40 MG tablet Take 1 tablet (40 mg total) by mouth daily at 12 noon. 11/26/13   Jonathan Kristeen Mans, MD  predniSONE (DELTASONE) 10 MG tablet Take 4 tablets (40 mg) daily for 2 days, then, Take 3 tablets (30 mg) daily for 2 days, then, Take 2 tablets (20 mg) daily for 2 days, then, Take 1 tablets (10 mg) daily for 1 days, then stop 11/26/13   Jonathan Osgood, MD  tiotropium (SPIRIVA HANDIHALER) 18 MCG inhalation capsule Place 1 capsule (18 mcg total) into inhaler and inhale daily. 11/26/13   Jonathan Osgood, MD    Physical Exam: Filed Vitals:   11/28/13 0230 11/28/13 0245 11/28/13 0249 11/28/13 0433  BP: 147/63 136/57 136/51 96/48  Pulse: 119 125 132 122  Temp:      TempSrc:      Resp: 32 32 35 27  SpO2: 99% 97% 97% 97%     General:  Well-developed well-nourished.  Eyes: Anicteric no pallor.  ENT: No discharge from the ears eyes nose mouth.  Neck: No mass felt. No JVD  felt.  Cardiovascular: S1-S2 heard.  Respiratory: Bilateral expiratory wheeze no crepitations.  Abdomen: Soft nontender bowel sounds present. No guarding or rigidity.  Skin: No rash.  Musculoskeletal: No edema.  Psychiatric: Appears normal.  Neurologic: Alert awake oriented to time place and person. Moves all extremities.  Labs on Admission:  Basic Metabolic Panel:  Recent Labs Lab 11/23/13 1224 11/24/13 0417 11/28/13 0215  NA 136* 132* 133*  K 5.0 4.6 4.4  CL 94* 93* 93*  CO2 26 26 27   GLUCOSE 237* 267* 261*  BUN 17 19 18   CREATININE 1.00 0.92 0.89  CALCIUM 9.7 9.5  8.2*   Liver Function Tests: No results found for this basename: AST, ALT, ALKPHOS, BILITOT, PROT, ALBUMIN,  in the last 168 hours No results found for this basename: LIPASE, AMYLASE,  in the last 168 hours No results found for this basename: AMMONIA,  in the last 168 hours CBC:  Recent Labs Lab 11/23/13 1224 11/24/13 0417 11/28/13 0215  WBC 12.8* 13.8* 16.1*  HGB 18.0* 16.6 17.1*  HCT 51.4 47.8 49.2  MCV 88.5 86.9 88.0  PLT 241 233 198   Cardiac Enzymes:  Recent Labs Lab 11/28/13 0215  TROPONINI <0.30    BNP (last 3 results)  Recent Labs  11/23/13 1224  PROBNP 82.0   CBG:  Recent Labs Lab 11/25/13 1237 11/25/13 1718 11/25/13 2214 11/26/13 0825 11/26/13 1217  GLUCAP 328* 319* 242* 295* 319*    Radiological Exams on Admission: Dg Chest Portable 1 View  11/28/2013   CLINICAL DATA:  Loss of consciousness  EXAM: PORTABLE CHEST - 1 VIEW  COMPARISON:  11/23/2013  FINDINGS: Chronic interstitial coarsening. No edema or consolidation. No visible effusion or pneumothorax. Normal heart size for technique.  IMPRESSION: No edema or consolidation.   Electronically Signed   By: Jorje Guild M.D.   On: 11/28/2013 02:56    EKG: Independently reviewed. Sinus tachycardia.  Assessment/Plan Principal Problem:   Acute respiratory failure Active Problems:   COPD (chronic obstructive  pulmonary disease)   Syncope   Fever   Acute encephalopathy   Diabetes mellitus   1. Acute respiratory failure probably from impending sepsis versus COPD exacerbation - patient has been empirically placed on vancomycin and cefepime after blood cultures obtained. Urine analysis and urine cultures and influenza PCR pending. Continue with nebulizers Pulmicort and IV steroids for COPD exacerbation. Patient will be continued on BiPAP for acute respiratory failure. I have consulted critical care. Check lactic acid levels. 2. Acute encephalopathy and syncope - encephalopathy probably secondary to fever presently patient is alert awake and oriented to time place and person follows commands. I have placed patient on neuro checks and swallow evaluation and we will get MRI brain. Follow blood cultures urine cultures and urinalysis and influenza PCR. Continue antibiotics. If encephalopathy worsens may need lumbar puncture. 3. Hypertension - mildly hypotensive presently may have to hold antihypertensives. Continue hydration. Patient on IV steroids which will act as stress dose steroids. 4. Diabetes mellitus type 2 - patient has been placed on home medications with sliding-scale coverage. Hold metformin. 5. Hyperlipidemia - continue statins.. 6. History of stroke. 7. Leukocytosis - probably from #1.  I have discussed with pulmonary critical care consultant. I have reviewed patient's old charts and labs.    Code Status: Full code.  Family Communication: Family at the bedside.  Disposition Plan: Admit to inpatient.    Ma Munoz N. Jonathan [name] Pager [phone]  If 7PM-7AM, please contact night-coverage [email or web address] Password [facility] 11/28/2013, 5:39 AM

## 2013-11-28 NOTE — Progress Notes (Signed)
ANTIBIOTIC CONSULT NOTE - INITIAL  Pharmacy Consult for vancomycin and cefepime Indication: rule out pneumonia  Allergies  Allergen Reactions  . Amoxicillin-Pot Clavulanate Other (See Comments)    Severe stomach pain.   . Quinapril Hcl     REACTION: back pain  . Valsartan     REACTION: back pain  . Varenicline Tartrate     REACTION: AMS, hallucinations, night mares    Patient Measurements: Height: 5' 8.11" (173 cm) Weight: 187 lb 6.3 oz (85 kg) IBW/kg (Calculated) : 68.65  Vital Signs: Temp: 104.4 F (40.2 C) (03/02 0552) Temp src: Rectal (03/02 0552) BP: 96/48 mmHg (03/02 0433) Pulse Rate: 122 (03/02 0433)  Labs:  Recent Labs  11/28/13 0215  WBC 16.1*  HGB 17.1*  PLT 198  CREATININE 0.89   Estimated Creatinine Clearance: 78.6 ml/min (by C-G formula based on Cr of 0.89).   Microbiology: Recent Results (from the past 720 hour(s))  URINE CULTURE     Status: None   Collection Time    11/23/13  2:32 PM      Result Value Ref Range Status   Specimen Description URINE, CLEAN CATCH   Final   Special Requests NONE   Final   Culture  Setup Time     Final   Value: 11/23/2013 14:54     Performed at [place]     Final   Value: NO GROWTH     Performed at [facility]   Culture     Final   Value: NO GROWTH     Performed at [facility]   Report Status 11/24/2013 FINAL   Final    Medical History: Past Medical History  Diagnosis Date  . HTN (hypertension)   . HLD (hyperlipidemia)   . Pulmonary nodule, right     lower lobe  . COPD (chronic obstructive pulmonary disease)   . Rotator cuff injury     right  . Frozen shoulder   . Shoulder pain   . Hyperkalemia   . Dermatitis seborrheica   . Other diseases of lung, not elsewhere classified   . Kidney stone   . Allergic rhinitis   . ED (erectile dysfunction)   . Diabetes mellitus type II   . Asthma   . Tobacco abuse      Assessment: 73yo male was recently  admitted for COPD exacerbation, discharged on prednisone, Levaquin, O2, and bronchodilators, now presents s/p syncopal episode followed by falling in tub, now w/ increased SOB, CXR currently negative, to begin IV ABX empirically for respiratory distress.  Goal of Therapy:  Vancomycin trough level 15-20 mcg/ml  Plan:  Will begin vancomycin 1000mg  IV Q12H and cefepime 1g IV Q8H and monitor CBC, Cx, levels prn.  Wynona Neat, PharmD, BCPS  11/28/2013,6:36 AM

## 2013-11-28 NOTE — ED Notes (Signed)
All blood drawn for both blood cultures and lactic acid; sent to lab.  Critical care MD to bedside to examine pt and speak with family.  Pt is to remain on bi pap for now.  Question if he may need intubation, family stated it was okay to do so if needed.

## 2013-11-28 NOTE — ED Provider Notes (Signed)
CSN: [id number]     Arrival date & time 11/28/13  0149 History   First MD Initiated Contact with Patient 11/28/13 0226     Chief Complaint  Patient presents with  . Loss of Consciousness      Patient is a 73 y.o. male presenting with shortness of breath. The history is provided by the patient and a relative. The history is limited by the condition of the patient.  Shortness of Breath Severity:  Severe Onset quality:  Gradual Duration: several days ago. Timing:  Constant Progression:  Worsening Chronicity:  Recurrent Relieved by:  Nothing Worsened by:  Activity Associated symptoms: cough and syncope   Associated symptoms: no chest pain   pt presents from home for SOB and syncope  Pt with recent hospital admission for COPD Since being at home, he has had increased SOB that has become severe Tonight he became increasingly SOB and while walking he had syncopal event No seizure reported He did hit his head but denies increased HA No neck or back pain.  He denies extremity trauma He denies active CP  Past Medical History  Diagnosis Date  . HTN (hypertension)   . HLD (hyperlipidemia)   . Pulmonary nodule, right     lower lobe  . COPD (chronic obstructive pulmonary disease)   . Rotator cuff injury     right  . Frozen shoulder   . Shoulder pain   . Hyperkalemia   . Dermatitis seborrheica   . Other diseases of lung, not elsewhere classified   . Kidney stone   . Allergic rhinitis   . ED (erectile dysfunction)   . Diabetes mellitus type II   . Asthma   . Tobacco abuse    Past Surgical History  Procedure Laterality Date  . Cervical discectomy  1998  . Ett  11/1994  . Tm repair    . Tee without cardioversion  12/03/2011    Procedure: TRANSESOPHAGEAL ECHOCARDIOGRAM (TEE);  Surgeon: Lelon Perla, MD;  Location: [facility] ENDOSCOPY;  Service: Cardiovascular;  Laterality: N/A;   Family History  Problem Relation Age of Onset  . Asthma Sister   . Emphysema Sister    History   Substance Use Topics  . Smoking status: Current Every Day Smoker -- 0.20 packs/day    Types: Cigarettes  . Smokeless tobacco: Never Used     Comment: prev smoked 1 1/2 PPD. Currently 5 cigs daily  . Alcohol Use: No     Comment: Quit 40 years ago    Review of Systems  Respiratory: Positive for cough and shortness of breath.   Cardiovascular: Positive for syncope. Negative for chest pain.  Neurological: Positive for syncope.  All other systems reviewed and are negative.      Allergies  Amoxicillin-pot clavulanate; Quinapril hcl; Valsartan; and Varenicline tartrate  Home Medications   Current Outpatient Rx  Name  Route  Sig  Dispense  Refill  . albuterol (PROVENTIL) (2.5 MG/3ML) 0.083% nebulizer solution   Nebulization   Take 3 mLs (2.5 mg total) by nebulization 2 (two) times daily as needed for shortness of breath.   75 mL   0   . albuterol (VENTOLIN HFA) 108 (90 BASE) MCG/ACT inhaler   Inhalation   Inhale 2 puffs into the lungs every 6 (six) hours as needed for wheezing.   1 Inhaler   11   . amLODipine (NORVASC) 5 MG tablet   Oral   Take 5 mg by mouth daily.         [name]  aspirin 81 MG tablet   Oral   Take 1 tablet (81 mg total) by mouth daily.   30 tablet   0   . atorvastatin (LIPITOR) 10 MG tablet   Oral   Take 10 mg by mouth daily.         [name] glipiZIDE (GLIPIZIDE XL) 10 MG 24 hr tablet   Oral   Take 1 tablet (10 mg total) by mouth daily.   90 tablet   0     *patient needs appointment*   . ibuprofen (ADVIL,MOTRIN) 200 MG tablet   Oral   Take 400 mg by mouth every 6 (six) hours as needed (pain).          . insulin glargine (LANTUS) 100 units/mL SOLN   Subcutaneous   Inject 40 Units into the skin daily at 8 pm.          . levofloxacin (LEVAQUIN) 500 MG tablet   Oral   Take 1 tablet (500 mg total) by mouth daily.   3 tablet   0   . metFORMIN (GLUCOPHAGE) 1000 MG tablet   Oral   Take 500-1,000 mg by mouth See admin instructions. Take 1  tablet in the morning, 0.5 tablet midday and 1 tablet in the evening         . OVER THE COUNTER MEDICATION   Nasal   Place 2 sprays into the nose 2 (two) times daily as needed (sinuses).         . chlorpheniramine-HYDROcodone (TUSSIONEX) 10-8 MG/5ML LQCR   Oral   Take 5 mLs by mouth every 12 (twelve) hours as needed for cough.   115 mL   0   . guaiFENesin (MUCINEX) 600 MG 12 hr tablet   Oral   Take 1 tablet (600 mg total) by mouth 2 (two) times daily.   15 tablet   0   . nicotine (EQL NICOTINE) 14 mg/24hr patch   Transdermal   Place 1 patch (14 mg total) onto the skin daily.   28 patch   0   . pantoprazole (PROTONIX) 40 MG tablet   Oral   Take 1 tablet (40 mg total) by mouth daily at 12 noon.   30 tablet   0   . predniSONE (DELTASONE) 10 MG tablet      Take 4 tablets (40 mg) daily for 2 days, then, Take 3 tablets (30 mg) daily for 2 days, then, Take 2 tablets (20 mg) daily for 2 days, then, Take 1 tablets (10 mg) daily for 1 days, then stop   19 tablet   0   . tiotropium (SPIRIVA HANDIHALER) 18 MCG inhalation capsule   Inhalation   Place 1 capsule (18 mcg total) into inhaler and inhale daily.   30 capsule   0    BP 136/51  Pulse 132  Temp(Src) 100.6 F (38.1 C) (Oral)  Resp 35  SpO2 97% Physical Exam CONSTITUTIONAL: Well developed/well nourished, anxious HEAD: Normocephalic/atraumatic, abrasion to forehead EYES: EOMI/PERRL ENMT: Mucous membranes moist NECK: supple no meningeal signs SPINE:entire spine nontender, No bruising/crepitance/stepoffs noted to spine CV: tachycardic S1/S2 noted, no murmurs/rubs/gallops noted LUNGS: wheezing bilaterally, tachypnea noted ABDOMEN: soft, nontender, no rebound or guarding GU:no cva tenderness NEURO: Pt is awake/alert, moves all extremitiesx4, no arm/leg drift is noted EXTREMITIES: pulses normal, full ROM, All extremities/joints palpated/ranged and nontender SKIN: warm, color normal PSYCH: anxious  ED Course   Procedures  Pt here with syncope and SOB Suspect he became hypoxic at home  which contributed to syncope He has abrasion to forehead, no increased HA and awake/alert.  Will defer CT head for now.   He did respond to bipap (pt very tachypneic on arrival)  He appears more comfortable He denies active CP.  I doubt acute PE/ACS at this time D/w triad dr Hal Hope, will admit to stepdown Pt checked on frequently and he appears improved  CRITICAL CARE Performed by: Sharyon Cable Total critical care time: 35 Critical care time was exclusive of separately billable procedures and treating other patients. Critical care was necessary to treat or prevent imminent or life-threatening deterioration. Critical care was time spent personally by me on the following activities: development of treatment plan with patient and/or surrogate as well as nursing, discussions with consultants, evaluation of patient's response to treatment, examination of patient, obtaining history from patient or surrogate, ordering and performing treatments and interventions, ordering and review of laboratory studies, ordering and review of radiographic studies, pulse oximetry and re-evaluation of patient's condition.  Labs Review Labs Reviewed  BASIC METABOLIC PANEL - Abnormal; Notable for the following:    Sodium 133 (*)    Chloride 93 (*)    Glucose, Bld 261 (*)    Calcium 8.2 (*)    GFR calc non Af Amer 83 (*)    All other components within normal limits  CBC - Abnormal; Notable for the following:    WBC 16.1 (*)    Hemoglobin 17.1 (*)    All other components within normal limits  I-STAT ARTERIAL BLOOD GAS, ED - Abnormal; Notable for the following:    pCO2 arterial 47.8 (*)    pO2, Arterial 157.0 (*)    Bicarbonate 29.6 (*)    Acid-Base Excess 4.0 (*)    All other components within normal limits  TROPONIN I   Imaging Review Dg Chest Portable 1 View  11/28/2013   CLINICAL DATA:  Loss of consciousness  EXAM:  PORTABLE CHEST - 1 VIEW  COMPARISON:  11/23/2013  FINDINGS: Chronic interstitial coarsening. No edema or consolidation. No visible effusion or pneumothorax. Normal heart size for technique.  IMPRESSION: No edema or consolidation.   Electronically Signed   By: Jorje Guild M.D.   On: 11/28/2013 02:56     EKG Interpretation   Date/Time:  Monday November 28 2013 02:00:08 EST Ventricular Rate:  113 PR Interval:  133 QRS Duration: 82 QT Interval:  304 QTC Calculation: 417 R Axis:   61 Text Interpretation:  Sinus tachycardia Anterior infarct, old No  significant change since last tracing Confirmed by Christy Gentles  MD, [place]  [phone]) on 11/28/2013 2:57:20 AM      MDM   Final diagnoses:  COPD (chronic obstructive pulmonary disease)  Respiratory failure    Nursing notes including past medical history and social history reviewed and considered in documentation xrays reviewed and considered Labs/vital reviewed and considered Previous records reviewed and considered     Sharyon Cable, MD 11/28/13 [phone]

## 2013-11-28 NOTE — Consult Note (Signed)
PULMONARY / CRITICAL CARE MEDICINE  Name: Jonathan [name] MRN: [id number] DOB: 1941/08/14    ADMISSION DATE:  11/28/2013 CONSULTATION DATE: 11/28/2013  REFERRING MD : EDP IMARY SERVICE: PCCM  CHIEF COMPLAINT:  SOB  BRIEF PATIENT DESCRIPTION: [name] ( last seen by RA 2012 ), just discharged 2/28 on steroids and levaquin after being treated for AECOPD  presented in acute resp distress after passing out in bathroom and landing in bath tub.  SIGNIFICANT EVENTS / STUDIES:  3/2  Head CT >>> nad  LINES / TUBES:  CULTURES: 3/2  Sputum >>> 3/2  Blood >>> 3/2  Urine >>>  ANTIBIOTICS: Vancomycin  3/2 >>> Cefepime  3/2 >>>  HISTORY OF PRESENT ILLNESS:   73 yo WF, life long smoker, retired [employer], [place] of [place] stage 2 copd last seen by Alva 2012, just discharged 2/28 for AECOPD on steroids and levaquin who presents in acute resp distress after passing out in bathroom and landing in bath tub. 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 Due to the complexity of his care and possibility he will need intubation PCCM will assume care and place him in the ICU unit. Note his wife reports he has green sinus drainage and his temperature in the ED is 104. HCAI abx will be instituted and we will check limited ct of sinuses once he is stable.  PAST MEDICAL HISTORY :  Past Medical History  Diagnosis Date  . HTN (hypertension)   . HLD (hyperlipidemia)   . Pulmonary nodule, right     lower lobe  . COPD (chronic obstructive pulmonary disease)   . Rotator cuff injury     right  . Frozen shoulder   . Shoulder pain   . Hyperkalemia   . Dermatitis seborrheica   . Other diseases of lung, not elsewhere classified   . Kidney stone   . Allergic rhinitis   . ED (erectile dysfunction)   . Diabetes mellitus type II   . Asthma   . Tobacco abuse    Past Surgical History  Procedure Laterality Date  .  Cervical discectomy  1998  . Ett  11/1994  . Tm repair    . Tee without cardioversion  12/03/2011    Procedure: TRANSESOPHAGEAL ECHOCARDIOGRAM (TEE);  Surgeon: Lelon Perla, MD;  Location: [facility] ENDOSCOPY;  Service: Cardiovascular;  Laterality: N/A;   Prior to Admission medications   Medication Sig Start Date End Date Taking? Authorizing Provider  albuterol (PROVENTIL) (2.5 MG/3ML) 0.083% nebulizer solution Take 3 mLs (2.5 mg total) by nebulization 2 (two) times daily as needed for shortness of breath. 11/26/13  Yes Shanker Kristeen Mans, MD  albuterol (VENTOLIN HFA) 108 (90 BASE) MCG/ACT inhaler Inhale 2 puffs into the lungs every 6 (six) hours as needed for wheezing. 11/22/12  Yes Abner Greenspan, MD  amLODipine (NORVASC) 5 MG tablet Take 5 mg by mouth daily.   Yes Historical Provider, MD  aspirin 81 MG tablet Take 1 tablet (81 mg total) by mouth daily. 11/26/13  Yes Shanker Kristeen Mans, MD  atorvastatin (LIPITOR) 10 MG tablet Take 10 mg by mouth daily.   Yes Historical Provider, MD  glipiZIDE (GLIPIZIDE XL) 10 MG 24 hr tablet Take 1 tablet (10 mg total) by mouth daily. 09/28/13  Yes Abner Greenspan, MD  ibuprofen (ADVIL,MOTRIN) 200 MG tablet Take 400 mg by mouth every 6 (six)  hours as needed (pain).    Yes Historical Provider, MD  insulin glargine (LANTUS) 100 units/mL SOLN Inject 40 Units into the skin daily at 8 pm.    Yes Historical Provider, MD  levofloxacin (LEVAQUIN) 500 MG tablet Take 1 tablet (500 mg total) by mouth daily. 11/26/13  Yes Shanker Kristeen Mans, MD  metFORMIN (GLUCOPHAGE) 1000 MG tablet Take 500-1,000 mg by mouth See admin instructions. Take 1 tablet in the morning, 0.5 tablet midday and 1 tablet in the evening   Yes Historical Provider, MD  OVER THE COUNTER MEDICATION Place 2 sprays into the nose 2 (two) times daily as needed (sinuses).   Yes Historical Provider, MD  chlorpheniramine-HYDROcodone (TUSSIONEX) 10-8 MG/5ML LQCR Take 5 mLs by mouth every 12 (twelve) hours as needed for cough.  11/26/13   Shanker Kristeen Mans, MD  guaiFENesin (MUCINEX) 600 MG 12 hr tablet Take 1 tablet (600 mg total) by mouth 2 (two) times daily. 11/26/13   Shanker Kristeen Mans, MD  nicotine (EQL NICOTINE) 14 mg/24hr patch Place 1 patch (14 mg total) onto the skin daily. 11/26/13   Shanker Kristeen Mans, MD  pantoprazole (PROTONIX) 40 MG tablet Take 1 tablet (40 mg total) by mouth daily at 12 noon. 11/26/13   Shanker Kristeen Mans, MD  predniSONE (DELTASONE) 10 MG tablet Take 4 tablets (40 mg) daily for 2 days, then, Take 3 tablets (30 mg) daily for 2 days, then, Take 2 tablets (20 mg) daily for 2 days, then, Take 1 tablets (10 mg) daily for 1 days, then stop 11/26/13   Jonetta Osgood, MD  tiotropium (SPIRIVA HANDIHALER) 18 MCG inhalation capsule Place 1 capsule (18 mcg total) into inhaler and inhale daily. 11/26/13   Shanker Kristeen Mans, MD   Allergies  Allergen Reactions  . Amoxicillin-Pot Clavulanate Other (See Comments)    Severe stomach pain.   . Quinapril Hcl     REACTION: back pain  . Valsartan     REACTION: back pain  . Varenicline Tartrate     REACTION: AMS, hallucinations, night mares   FAMILY HISTORY:  Family History  Problem Relation Age of Onset  . Asthma Sister   . Emphysema Sister    SOCIAL HISTORY:  reports that he has been smoking Cigarettes.  He has been smoking about 0.20 packs per day. He has never used smokeless tobacco. He reports that he does not drink alcohol or use illicit drugs.  REVIEW OF SYSTEMS:  10 point review of system taken, please see HPI for positives and negatives.  SUBJECTIVE:   VITAL SIGNS: Temp:  [100.6 F (38.1 C)] 100.6 F (38.1 C) (03/02 0201) Pulse Rate:  [113-132] 122 (03/02 0433) Resp:  [27-35] 27 (03/02 0433) BP: (96-161)/(48-64) 96/48 mmHg (03/02 0433) SpO2:  [96 %-99 %] 97 % (03/02 0433)  HEMODYNAMICS:   VENTILATOR SETTINGS:   INTAKE / OUTPUT: Intake/Output   None     PHYSICAL EXAMINATION: General:  Disheveled WM on NIMVS but  cooperative HEENT:  No LAN/JVD Cardio  HSR RRR  Pul: Exp wheeze Abdomen: Soft + bs Musculoskeletal:  New abrasions rt knee, old ecchymosis left knee Skin:  Warm  LABS:  CBC  Recent Labs Lab 11/23/13 1224 11/24/13 0417 11/28/13 0215  WBC 12.8* 13.8* 16.1*  HGB 18.0* 16.6 17.1*  HCT 51.4 47.8 49.2  PLT 241 233 198   Coag's No results found for this basename: APTT, INR,  in the last 168 hours  BMET  Recent Labs Lab 11/23/13 1224 11/24/13 0417  11/28/13 0215  NA 136* 132* 133*  K 5.0 4.6 4.4  CL 94* 93* 93*  CO2 26 26 27   BUN 17 19 18   CREATININE 1.00 0.92 0.89  GLUCOSE 237* 267* 261*   Electrolytes  Recent Labs Lab 11/23/13 1224 11/24/13 0417 11/28/13 0215  CALCIUM 9.7 9.5 8.2*   Sepsis Markers No results found for this basename: LATICACIDVEN, PROCALCITON, O2SATVEN,  in the last 168 hours ABG  Recent Labs Lab 11/28/13 0336  PHART 7.399  PCO2ART 47.8*  PO2ART 157.0*   Liver Enzymes No results found for this basename: AST, ALT, ALKPHOS, BILITOT, ALBUMIN,  in the last 168 hours  Cardiac Enzymes  Recent Labs Lab 11/23/13 1224 11/28/13 0215  TROPONINI  --  <0.30  PROBNP 82.0  --    Glucose  Recent Labs Lab 11/25/13 0822 11/25/13 1237 11/25/13 1718 11/25/13 2214 11/26/13 0825 11/26/13 1217  GLUCAP 289* 328* 319* 242* 295* 319*    IMAGING:  Ct Head Wo Contrast  11/28/2013   CLINICAL DATA:  Fall.  EXAM: CT HEAD WITHOUT CONTRAST  TECHNIQUE: Contiguous axial images were obtained from the base of the skull through the vertex without intravenous contrast.  COMPARISON:  MR HEAD W/O CM dated 12/01/2011  FINDINGS: The ventricles and sulci are normal for age. No intraparenchymal hemorrhage, mass effect nor midline shift. Patchy supratentorial white matter hypodensities are within normal range for patient's age and though non-specific suggest sequelae of chronic small vessel ischemic disease. No acute large vascular territory infarcts.  No abnormal  extra-axial fluid collections. Basal cisterns are patent. Moderate calcific atherosclerosis of the carotid siphons.  No skull fracture. Small left maxillary mucosal retention cyst, mild paranasal sinus mucosal thickening without air-fluid levels. Mastoid air cells are well aerated. Moderate temporomandibular osteoarthrosis. . The included ocular globes and orbital contents are non-suspicious. Marked asymmetry of the parotid glands, the right appears enlarged in the left appears very fatty replaced.  IMPRESSION: No acute intracranial process.  Moderate white matter changes suggest chronic small vessel ischemic disease.  Asymmetric parotid glands, the right appears mildly edematous and the left appears fatty replaced versus resected with flap, recommend clinical correlation and correlation with surgical history.   Electronically Signed   By: Elon Alas   On: 11/28/2013 05:40   Dg Chest Portable 1 View  11/28/2013   CLINICAL DATA:  Loss of consciousness  EXAM: PORTABLE CHEST - 1 VIEW  COMPARISON:  11/23/2013  FINDINGS: Chronic interstitial coarsening. No edema or consolidation. No visible effusion or pneumothorax. Normal heart size for technique.  IMPRESSION: No edema or consolidation.   Electronically Signed   By: Jorje Guild M.D.   On: 11/28/2013 02:56   ASSESSMENT / PLAN:  PULMONARY A:  AECOPD Tobacco use disorder Should r/o PE given syncope Possible HCAP P:   Goal SpO2>92 Supplemental oxygen BiPAP / NIMV PRN May need intubation / mechanical ventilation Albuterol / Atrovent / Pulmicort Solu-Medrol 40 q8h Chest CTA  CARDIOVASCULAR A:  Syncope in setting of respiratory distress HTN P:  Trend troponin Norvasc, Lipitor, ASA TTE  RENAL A:   No active issues P:  Trend BMP NS@75   GASTROINTESTINAL A:   GI Px is not indicated P: NPO for now as may need airway  HEMATOLOGIC A:  VTE Px P:  Heparin  INFECTIOUS A:   Possible HCAP P:   Abx / cx as  above PCT  ENDOCRINE A:   DM Hyperglycemia P:  Hold Glipizide, Lantus SSI  NEUROLOGIC A:  Acute encephalopathy P:   EEG  MRI  Richardson Landry Minor ACNP Maryanna Shape PCCM Pager [phone] till 3 pm If no answer page [phone] 11/28/2013, 6:17 AM  I have personally obtained history, examined patient, evaluated and interpreted laboratory and imaging results, reviewed medical records, formulated assessment / plan and placed orders.  CRITICAL CARE:  The patient is critically ill with multiple organ systems failure and requires high complexity decision making for assessment and support, frequent evaluation and titration of therapies, application of advanced monitoring technologies and extensive interpretation of multiple databases. Critical Care Time devoted to patient care services described in this note is 60 minutes.   Doree Fudge, MD Pulmonary and [place] Pager: [phone]  11/28/2013, 9:47 AM

## 2013-11-28 NOTE — ED Notes (Signed)
Pt incontinent of urine and feces.  Pt cleaned, bedding changed, incontinence pad and diaper placed. Pt tolerated all well.  Mentally pt remains able to respond verbally through bi pap although his wife questions he is answering appropriately.  She feels he is somewhat confused.

## 2013-11-28 NOTE — ED Notes (Signed)
Tachypnea continues while on bi pap.  Pt lungs clearing with continuous neb tx.  Family at bedside.

## 2013-11-28 NOTE — Progress Notes (Signed)
EEG completed; results pending.    

## 2013-11-28 NOTE — Procedures (Signed)
EEG report.  Brief clinical history:  73 yo WF, life long smoker, retired [employer], [place] of gold stage 2 copd last seen by Alva 2012, just discharged 2/28 for AECOPD on steroids and levaquin who presents in acute resp distress after passing out in bathroom and landing in bath tub   Technique: this is a 17 channel routine scalp EEG performed at the bedside with bipolar and monopolar montages arranged in accordance to the international 10/20 system of electrode placement. One channel was dedicated to EKG recording.  The study was performed during wakefulness, drowsiness, and stage 2 sleep. No activating procedures performed.  Description:In the wakeful state, the best background consisted of a medium amplitude, posterior dominant, well sustained, symmetric and reactive 8 Hz rhythm. Drowsiness demonstrated dropout of the alpha rhythm. Stage 2 sleep showed symmetric and synchronous sleep spindles without intermixed epileptiform discharges. No focal or generalized epileptiform discharges noted.  No slowing seen.  EKG showed sinus rhythm.  Impression: this is a normal awake and asleep EEG. Please, be aware that a normal EEG does not exclude the possibility of epilepsy.  Clinical correlation is advised.  Dorian Pod, MD

## 2013-11-28 NOTE — ED Notes (Signed)
Pt went to [facility], had syncopal episode and fell into the tub.  Tachypnea and dyspnea upon arrival to ED.  Just dc'd from hospital on sat after copd exacerbation.  Now on home O2 at 2L via nasal cannula.

## 2013-11-28 NOTE — Progress Notes (Signed)
Transported patient to C.T on Bipap. Patient remained stable during transports.

## 2013-11-28 NOTE — Progress Notes (Signed)
Inpatient Diabetes Program Recommendations  AACE/ADA: New Consensus Statement on Inpatient Glycemic Control (2013)  Target Ranges:  Prepandial:   less than 140 mg/dL      Peak postprandial:   less than 180 mg/dL (1-2 hours)      Critically ill patients:  140 - 180 mg/dL   Reason for Visit: Results for Jonathan [name], Jonathan [name] (MRN [id number]) as of 11/28/2013 09:13  Ref. Range 11/26/2013 08:25 11/26/2013 12:17 11/28/2013 08:14  Glucose-Capillary Latest Range: 70-99 mg/dL 295 (H) 319 (H) 338 (H)   Diabetes history: Type 2 diabetes Outpatient Diabetes medications: Metformin 1000 mg bid, Glipizide XL 10 mg daily, Lantus 40 units q HS Current orders for Inpatient glycemic control: Lantus 40 units daily and Novolog sensitive tid with meals  Note patient also on Solumedrol 40 mg IV q 8 hours.    Consider increasing Novolog correction to resistant q 4 hours (while on IV steroids).    Adah Perl, RN, BC-ADM Inpatient Diabetes Coordinator Pager [phone]

## 2013-11-28 NOTE — ED Notes (Signed)
Pt to CT on monitor with RN and RT accompanying, portable bi pap remains in place.

## 2013-11-28 NOTE — ED Notes (Signed)
Pt returned to room; no change in status.  Monitor shows ST with occasion unifocal PVC.

## 2013-11-28 NOTE — ED Notes (Signed)
Went into room introduced self to patient and wife , report called to [facility] 2M12.

## 2013-11-28 NOTE — Progress Notes (Signed)
Placed patient on Bipap 14/7 with oxygen set at 50%

## 2013-11-28 NOTE — Progress Notes (Signed)
  Echocardiogram 2D Echocardiogram has been performed.  Jonathan [name] 11/28/2013, 3:39 PM

## 2013-11-28 NOTE — ED Notes (Signed)
Per wife, pt discharged from hospital after copd exacerbation on 2/28.  Went home with O2 and now using a walker.  Grunting respirations with insp/exp respirations.  [place] to forehead s/p fall into bathtub tonight when he got up to use bathroom.  Pt smoking approx 2 cigarettes a day since getting home.  Wife trying to get him to quit smoking.

## 2013-11-29 ENCOUNTER — Inpatient Hospital Stay (HOSPITAL_COMMUNITY): Payer: Medicare Other

## 2013-11-29 DIAGNOSIS — R55 Syncope and collapse: Secondary | ICD-10-CM

## 2013-11-29 LAB — LEGIONELLA ANTIGEN, URINE: Legionella Antigen, Urine: NEGATIVE

## 2013-11-29 LAB — GLUCOSE, CAPILLARY
Glucose-Capillary: 168 mg/dL — ABNORMAL HIGH (ref 70–99)
Glucose-Capillary: 171 mg/dL — ABNORMAL HIGH (ref 70–99)
Glucose-Capillary: 176 mg/dL — ABNORMAL HIGH (ref 70–99)
Glucose-Capillary: 204 mg/dL — ABNORMAL HIGH (ref 70–99)
Glucose-Capillary: 247 mg/dL — ABNORMAL HIGH (ref 70–99)
Glucose-Capillary: 273 mg/dL — ABNORMAL HIGH (ref 70–99)
Glucose-Capillary: 313 mg/dL — ABNORMAL HIGH (ref 70–99)

## 2013-11-29 LAB — BLOOD GAS, ARTERIAL
Acid-Base Excess: 4 mmol/L — ABNORMAL HIGH (ref 0.0–2.0)
Bicarbonate: 28.3 mEq/L — ABNORMAL HIGH (ref 20.0–24.0)
Drawn by: 10006
O2 Content: 2 L/min
O2 Saturation: 94.9 %
Patient temperature: 98.6
TCO2: 29.7 mmol/L (ref 0–100)
pCO2 arterial: 45.3 mmHg — ABNORMAL HIGH (ref 35.0–45.0)
pH, Arterial: 7.413 (ref 7.350–7.450)
pO2, Arterial: 71.6 mmHg — ABNORMAL LOW (ref 80.0–100.0)

## 2013-11-29 LAB — LIPID PANEL
Cholesterol: 98 mg/dL (ref 0–200)
HDL: 53 mg/dL (ref >39)
LDL Cholesterol: 26 mg/dL (ref 0–99)
Total CHOL/HDL Ratio: 1.8 RATIO
Triglycerides: 96 mg/dL (ref <150)
VLDL: 19 mg/dL (ref 0–40)

## 2013-11-29 LAB — CBC
HCT: 45.2 % (ref 39.0–52.0)
Hemoglobin: 15.6 g/dL (ref 13.0–17.0)
MCH: 30.2 pg (ref 26.0–34.0)
MCHC: 34.5 g/dL (ref 30.0–36.0)
MCV: 87.6 fL (ref 78.0–100.0)
Platelets: 194 10*3/uL (ref 150–400)
RBC: 5.16 MIL/uL (ref 4.22–5.81)
RDW: 13.1 % (ref 11.5–15.5)
WBC: 16.8 10*3/uL — ABNORMAL HIGH (ref 4.0–10.5)

## 2013-11-29 LAB — BASIC METABOLIC PANEL
BUN: 18 mg/dL (ref 6–23)
CO2: 29 mEq/L (ref 19–32)
Calcium: 8.2 mg/dL — ABNORMAL LOW (ref 8.4–10.5)
Chloride: 98 mEq/L (ref 96–112)
Creatinine, Ser: 0.77 mg/dL (ref 0.50–1.35)
GFR calc Af Amer: >90 mL/min (ref >90)
GFR calc non Af Amer: 88 mL/min — ABNORMAL LOW (ref >90)
Glucose, Bld: 191 mg/dL — ABNORMAL HIGH (ref 70–99)
Potassium: 4.3 mEq/L (ref 3.7–5.3)
Sodium: 139 mEq/L (ref 137–147)

## 2013-11-29 LAB — PROCALCITONIN: Procalcitonin: <0.1 ng/mL

## 2013-11-29 MED ORDER — INSULIN ASPART 100 UNIT/ML ~~LOC~~ SOLN
0.0000 [IU] | Freq: Every day | SUBCUTANEOUS | Status: DC
  Administered 2013-11-29: 4 [IU] via SUBCUTANEOUS
  Administered 2013-11-30: 3 [IU] via SUBCUTANEOUS

## 2013-11-29 MED ORDER — INSULIN ASPART 100 UNIT/ML ~~LOC~~ SOLN
0.0000 [IU] | Freq: Three times a day (TID) | SUBCUTANEOUS | Status: DC
  Administered 2013-11-30: 8 [IU] via SUBCUTANEOUS
  Administered 2013-11-30: 5 [IU] via SUBCUTANEOUS
  Administered 2013-11-30: 8 [IU] via SUBCUTANEOUS
  Administered 2013-12-01: 2 [IU] via SUBCUTANEOUS
  Administered 2013-12-01: 3 [IU] via SUBCUTANEOUS

## 2013-11-29 MED ORDER — IPRATROPIUM-ALBUTEROL 0.5-2.5 (3) MG/3ML IN SOLN
3.0000 mL | Freq: Three times a day (TID) | RESPIRATORY_TRACT | Status: DC
  Administered 2013-11-30: 3 mL via RESPIRATORY_TRACT
  Filled 2013-11-29: qty 3

## 2013-11-29 MED ORDER — VANCOMYCIN HCL 10 G IV SOLR
1250.0000 mg | Freq: Two times a day (BID) | INTRAVENOUS | Status: DC
  Administered 2013-11-29 – 2013-12-02 (×6): 1250 mg via INTRAVENOUS
  Filled 2013-11-29 (×10): qty 1250

## 2013-11-29 MED ORDER — VANCOMYCIN HCL IN DEXTROSE 1-5 GM/200ML-% IV SOLN
1000.0000 mg | Freq: Two times a day (BID) | INTRAVENOUS | Status: DC
  Filled 2013-11-29: qty 200

## 2013-11-29 MED ORDER — INSULIN ASPART 100 UNIT/ML ~~LOC~~ SOLN: 0.0000 [IU] | Freq: Three times a day (TID) | SUBCUTANEOUS | Status: DC

## 2013-11-29 MED ORDER — LEVOFLOXACIN IN D5W 750 MG/150ML IV SOLN
750.0000 mg | INTRAVENOUS | Status: DC
  Administered 2013-11-29: 750 mg via INTRAVENOUS
  Filled 2013-11-29 (×2): qty 150

## 2013-11-29 MED ORDER — INSULIN ASPART 100 UNIT/ML ~~LOC~~ SOLN
0.0000 [IU] | Freq: Three times a day (TID) | SUBCUTANEOUS | Status: DC
  Administered 2013-11-29: 5 [IU] via SUBCUTANEOUS

## 2013-11-29 NOTE — Progress Notes (Signed)
Jonathan [name] [id number]  Transfer Data: 11/29/2013 5:31 PM  Attending Provider: Rise Patience, MD  SVX:[id number] Tower, MD  Code Status: Full  Jonathan [name] is a 74 y.o. male patient transferred from 83M  -No acute distress noted.  -No complaints of shortness of breath.  -No complaints of chest pain.    ?  IV Fluids: IV in place, occlusive dsg intact without redness, IV cath antecubital left, condition patent and no redness  normal saline.  Allergies: Amoxicillin-pot clavulanate; Quinapril hcl; Valsartan; and Varenicline tartrate  Past Medical History:  has a past medical history of HTN (hypertension); HLD (hyperlipidemia); Pulmonary nodule, right; COPD (chronic obstructive pulmonary disease); Rotator cuff injury; Frozen shoulder; Shoulder pain; Hyperkalemia; Dermatitis seborrheica; Other diseases of lung, not elsewhere classified; Kidney stone; Allergic rhinitis; ED (erectile dysfunction); Diabetes mellitus type II; Asthma; Tobacco abuse; and Stroke.  Past Surgical History:  has past surgical history that includes Cervical discectomy (1998); ETT (11/1994); TM repair; and TEE without cardioversion (12/03/2011).  Social History:  reports that he has been smoking Cigarettes.  He has been smoking about 0.20 packs per day. He has never used smokeless tobacco. He reports that he does not drink alcohol or use illicit drugs.  Skin: Intact  Patient/Family orientated to room. Information packet given to patient/family. Admission inpatient armband information verified with patient/family to include name and date of birth and placed on patient arm. Side rails up x 2, fall assessment and education completed with patient/family. Patient/family able to verbalize understanding of risk associated with falls and verbalized understanding to call for assistance before getting out of bed. Call light within reach. Patient/family able to voice and demonstrate understanding of unit orientation instructions.  Will  continue to evaluate and treat per MD orders.

## 2013-11-29 NOTE — Care Management Note (Signed)
    Page 1 of 1   11/29/2013     4:37:00 PM   CARE MANAGEMENT NOTE 11/29/2013  Patient:  Jonathan [name], Jonathan [name]   Account Number:  [id number]  Date Initiated:  11/29/2013  Documentation initiated by:  Tomi Bamberger  Subjective/Objective Assessment:   admit     Action/Plan:   Anticipated DC Date:  11/26/2013   Anticipated DC Plan:  [place]  CM consult      Choice offered to / List presented to:             Status of service:  In process, will continue to follow Medicare Important Message given?   (If response is "NO", the following Medicare IM given date fields will be blank) Date Medicare IM given:   Date Additional Medicare IM given:    Discharge Disposition:    Per UR Regulation:    If discussed at Long Length of Stay Meetings, dates discussed:    Comments:  11/28/13 [place], BSN [phone] NCM sees there is a referral for hhpt for patient which patient dc on 2/28.  NCM called patient by phone and left message for a return call at 10 am, have not received a return call back yet , attempted to call again and left voice message with my phone number for a return call back at [phone].

## 2013-11-29 NOTE — Progress Notes (Signed)
[place] for vancomycin and levaquin Indication: bacteremia/CONS UTI and AECOPD  Allergies  Allergen Reactions  . Amoxicillin-Pot Clavulanate Other (See Comments)    Severe stomach pain.   . Quinapril Hcl     REACTION: back pain  . Valsartan     REACTION: back pain  . Varenicline Tartrate     REACTION: AMS, hallucinations, night mares    Patient Measurements: Height: 5' 8.11" (173 cm) Weight: 183 lb 13.8 oz (83.4 kg) IBW/kg (Calculated) : 68.65  Vital Signs: Temp: 97.9 F (36.6 C) (03/03 0743) Temp src: Oral (03/03 0743) BP: 122/63 mmHg (03/03 0900) Pulse Rate: 63 (03/03 0900)  Labs:  Recent Labs  11/28/13 0732 11/28/13 0830 11/29/13 0240  WBC 14.5* 14.4* 16.8*  HGB 16.6 16.4 15.6  PLT 236 196 194  CREATININE 0.99 0.92 0.77   Estimated Creatinine Clearance: 86.8 ml/min (by C-G formula based on Cr of 0.77).   Microbiology: Recent Results (from the past 720 hour(s))  URINE CULTURE     Status: None   Collection Time    11/23/13  2:32 PM      Result Value Ref Range Status   Specimen Description URINE, CLEAN CATCH   Final   Special Requests NONE   Final   Culture  Setup Time     Final   Value: 11/23/2013 14:54     Performed at [place]     Final   Value: NO GROWTH     Performed at [facility]   Culture     Final   Value: NO GROWTH     Performed at [facility]   Report Status 11/24/2013 FINAL   Final  CULTURE, BLOOD (ROUTINE X 2)     Status: None   Collection Time    11/28/13  6:05 AM      Result Value Ref Range Status   Specimen Description BLOOD FOREARM RIGHT   Final   Special Requests BOTTLES DRAWN AEROBIC AND ANAEROBIC 5CC   Final   Culture  Setup Time     Final   Value: 11/28/2013 10:32     Performed at [facility]   Culture     Final   Value:        BLOOD CULTURE RECEIVED NO GROWTH TO DATE CULTURE WILL BE HELD FOR 5 DAYS BEFORE ISSUING A FINAL NEGATIVE REPORT      Performed at [facility]   Report Status PENDING   Incomplete  CULTURE, BLOOD (ROUTINE X 2)     Status: None   Collection Time    11/28/13  6:20 AM      Result Value Ref Range Status   Specimen Description BLOOD HAND RIGHT   Final   Special Requests BOTTLES DRAWN AEROBIC AND ANAEROBIC 5CC   Final   Culture  Setup Time     Final   Value: 11/28/2013 10:32     Performed at [facility]   Culture     Final   Value:        BLOOD CULTURE RECEIVED NO GROWTH TO DATE CULTURE WILL BE HELD FOR 5 DAYS BEFORE ISSUING A FINAL NEGATIVE REPORT     Performed at [facility]   Report Status PENDING   Incomplete  URINE CULTURE     Status: None   Collection Time    11/28/13  6:29 AM      Result Value Ref  Range Status   Specimen Description URINE, RANDOM   Final   Special Requests NONE   Final   Culture  Setup Time     Final   Value: 11/28/2013 07:04     Performed at [place]     Final   Value: 80,000 COLONIES/ML     Performed at [facility]   Culture     Final   Value: STAPHYLOCOCCUS SPECIES (COAGULASE NEGATIVE)     Note: RIFAMPIN AND GENTAMICIN SHOULD NOT BE USED AS SINGLE DRUGS FOR TREATMENT OF STAPH INFECTIONS.     Performed at [facility]   Report Status PENDING   Incomplete  MRSA PCR SCREENING     Status: None   Collection Time    11/28/13  9:24 AM      Result Value Ref Range Status   MRSA by PCR NEGATIVE  NEGATIVE Final   Comment:            The GeneXpert MRSA Assay (FDA     approved for NASAL specimens     only), is one component of a     comprehensive MRSA colonization     surveillance program. It is not     intended to diagnose MRSA     infection nor to guide or     monitor treatment for     MRSA infections.    Assessment: 73yo male recently admitted for COPD exacerbation, discharged on prednisone, Levaquin, O2, and bronchodilators, now presents s/p syncopal episode followed by falling in tub.  Pt to  start Levaquin (Day #1) for AECOPD and to continue Vancomycin (Day #2) for GPC in blood/CONS in urine.  3/2 Vanco>> 3/2 Cefepime>>3/2 3/4 Levaquin>>  3/2 Bldx2>>GPC in 1/2 3/2 Urine>>CONS MRSA PCR neg  Goal of Therapy:  Vancomycin trough level 15-20 mcg/ml  Plan:  1) Restart Vancomycin 1250mg  IV q12h- last dose at 0800 this a.m. 2) Levaquin 750mg  IV q12h 3) Will f/u micro data, renal function, pt's clinical condition, trough at [place], PharmD, [facility] pharmacist, pager [phone] 11/29/2013,11:55 AM

## 2013-11-29 NOTE — Progress Notes (Signed)
Inpatient Diabetes Program Recommendations  AACE/ADA: New Consensus Statement on Inpatient Glycemic Control (2013)  Target Ranges:  Prepandial:   less than 140 mg/dL      Peak postprandial:   less than 180 mg/dL (1-2 hours)      Critically ill patients:  140 - 180 mg/dL   Reason for Visit:Results for HOLBERT, CAPLES (MRN [id number]) as of 11/29/2013 10:11  Ref. Range 11/28/2013 08:30  Hemoglobin A1C Latest Range: <5.7 % 8.4 (H)   Results for DAISUKE, BAILEY (MRN [id number]) as of 11/29/2013 10:11  Ref. Range 11/28/2013 15:30 11/28/2013 18:52 11/29/2013 00:15 11/29/2013 03:50 11/29/2013 07:42  Glucose-Capillary Latest Range: 70-99 mg/dL 301 (H) 146 (H) 176 (H) 171 (H) 168 (H)  Diabetes history: Type 2 diabetes  Outpatient Diabetes medications: Metformin 1000 mg bid, Glipizide XL 10 mg daily, Lantus 40 units q HS  Please consider restarting 1/2 of home dose of Lantus. Consider Lantus 20 units daily.    Thanks, Adah Perl, RN, BC-ADM Inpatient Diabetes Coordinator Pager [phone]

## 2013-11-29 NOTE — Progress Notes (Addendum)
PULMONARY / CRITICAL CARE MEDICINE  Name: Jonathan [name] MRN: [id number] DOB: Mar 24, 1941    ADMISSION DATE:  11/28/2013 CONSULTATION DATE: 11/28/2013  REFERRING MD : EDP IMARY SERVICE: PCCM  CHIEF COMPLAINT:  SOB  BRIEF PATIENT DESCRIPTION: [name] ( last seen by RA 2012 ), just discharged 2/28 on steroids and levaquin after being treated for AECOPD  presented in acute resp distress after passing out in bathroom and landing in bath tub.  SIGNIFICANT EVENTS / STUDIES:  3/2  Head CT >>> nad 3/2  TTE >>> EF 60, no RWMA 3/2  EEG >>> nad 3/2  Chest CTA >>> emphysema, no central PE  LINES / TUBES:  CULTURES: 3/2  Blood >>> GPC? >>> 3/2  Urine >>> STAPHYLOCOCCUS SPECIES (COAGULASE NEGATIVE)  ANTIBIOTICS: Vancomycin  3/2 >>> Cefepime  3/2 >>> 3/3 Levaquin 3/3 >>>  INTERVAL HISTORY:  No acute events overnight.  Breathing is better.  PE / syncope workup negative.   VITAL SIGNS: Temp:  [97.5 F (36.4 C)-97.9 F (36.6 C)] 97.9 F (36.6 C) (03/03 0743) Pulse Rate:  [59-81] 63 (03/03 0900) Resp:  [15-31] 19 (03/03 0900) BP: (114-143)/(53-70) 122/63 mmHg (03/03 0900) SpO2:  [95 %-100 %] 96 % (03/03 1134) Weight:  [83.4 kg (183 lb 13.8 oz)] 83.4 kg (183 lb 13.8 oz) (03/03 0427)  HEMODYNAMICS:   VENTILATOR SETTINGS:   INTAKE / OUTPUT: Intake/Output     03/02 0701 - 03/03 0700 03/03 0701 - 03/04 0700   I.V. (mL/kg) 1650 (19.8)    IV Piggyback 350 200   Total Intake(mL/kg) 2000 (24) 200 (2.4)   Urine (mL/kg/hr) 1875 (0.9)    Total Output 1875     Net +125 +200        Urine Occurrence 1 x      PHYSICAL EXAMINATION: General:  No distress HEENT:  No JVD Cardio:  Regular, no murmurs Pul: Diminished air entry, prolonged expiratory phase, expiratory wheezing Abdomen: Soft, bowel sounds present Musculoskeletal:  Moves all extremities Skin:  No rash  LABS:  CBC  Recent Labs Lab 11/28/13 0732 11/28/13 0830 11/29/13 0240  WBC 14.5* 14.4* 16.8*  HGB 16.6  16.4 15.6  HCT 48.8 48.7 45.2  PLT 236 196 194   Coag's  Recent Labs Lab 11/28/13 0732  APTT 26  INR 1.04   BMET  Recent Labs Lab 11/28/13 0732 11/28/13 0830 11/29/13 0240  NA 138 134* 139  K 4.4 4.2 4.3  CL 94* 92* 98  CO2 25 25 29   BUN 20 20 18   CREATININE 0.99 0.92 0.77  GLUCOSE 370* 371* 191*   Electrolytes  Recent Labs Lab 11/28/13 0732 11/28/13 0830 11/29/13 0240  CALCIUM 8.2* 8.4 8.2*  MG 2.1  --   --   PHOS 3.3  --   --    Sepsis Markers  Recent Labs Lab 11/28/13 0615 11/28/13 0732 11/28/13 0830 11/29/13 0240  LATICACIDVEN 3.8*  --  4.4*  --   PROCALCITON  --  0.14  --  <0.10   ABG  Recent Labs Lab 11/28/13 0336 11/29/13 0540  PHART 7.399 7.413  PCO2ART 47.8* 45.3*  PO2ART 157.0* 71.6*   Liver Enzymes  Recent Labs Lab 11/28/13 0732 11/28/13 0830  AST 24 23  ALT 31 30  ALKPHOS 55 57  BILITOT 1.3* 1.3*  ALBUMIN 3.4* 3.4*   Cardiac Enzymes  Recent Labs Lab 11/23/13 1224 11/28/13 0215 11/28/13 0615 11/28/13 0830  TROPONINI  --  <0.30 <0.30  --  PROBNP 82.0  --   --  463.5*   Glucose  Recent Labs Lab 11/28/13 1354 11/28/13 1530 11/28/13 1852 11/29/13 0015 11/29/13 0350 11/29/13 0742  GLUCAP 321* 301* 146* 176* 171* 168*   IMAGING:  Ct Head Wo Contrast  11/28/2013   CLINICAL DATA:  Fall.  EXAM: CT HEAD WITHOUT CONTRAST  TECHNIQUE: Contiguous axial images were obtained from the base of the skull through the vertex without intravenous contrast.  COMPARISON:  MR HEAD W/O CM dated 12/01/2011  FINDINGS: The ventricles and sulci are normal for age. No intraparenchymal hemorrhage, mass effect nor midline shift. Patchy supratentorial white matter hypodensities are within normal range for patient's age and though non-specific suggest sequelae of chronic small vessel ischemic disease. No acute large vascular territory infarcts.  No abnormal extra-axial fluid collections. Basal cisterns are patent. Moderate calcific atherosclerosis  of the carotid siphons.  No skull fracture. Small left maxillary mucosal retention cyst, mild paranasal sinus mucosal thickening without air-fluid levels. Mastoid air cells are well aerated. Moderate temporomandibular osteoarthrosis. . The included ocular globes and orbital contents are non-suspicious. Marked asymmetry of the parotid glands, the right appears enlarged in the left appears very fatty replaced.  IMPRESSION: No acute intracranial process.  Moderate white matter changes suggest chronic small vessel ischemic disease.  Asymmetric parotid glands, the right appears mildly edematous and the left appears fatty replaced versus resected with flap, recommend clinical correlation and correlation with surgical history.   Electronically Signed   By: Elon Alas   On: 11/28/2013 05:40   Ct Angio Chest Pe W/cm &/or Wo Cm  11/28/2013   CLINICAL DATA:  Shortness of breath. Found down. Evaluate for potential pulmonary embolism.  EXAM: CT ANGIOGRAPHY CHEST WITH CONTRAST  TECHNIQUE: Multidetector CT imaging of the chest was performed using the standard protocol during bolus administration of intravenous contrast. Multiplanar CT image reconstructions and MIPs were obtained to evaluate the vascular anatomy.  CONTRAST:  141mL OMNIPAQUE IOHEXOL 350 MG/ML SOLN  COMPARISON:  Chest CT 05/07/2010.  FINDINGS: Mediastinum: Study is limited by patient respiratory motion. With these limitations in mind, there is no central, lobar or segmental sized pulmonary embolism. Accurate assessment for smaller subsegmental sized pulmonary embolism is not possible secondary to the limitations of this examination. Heart size is normal. There is no significant pericardial fluid, thickening or pericardial calcification. There is atherosclerosis of the thoracic aorta, the great vessels of the mediastinum and the coronary arteries, including calcified atherosclerotic plaque in the left main, left anterior descending, left circumflex right  coronary arteries. No pathologically enlarged mediastinal or hilar lymph nodes. Esophagus is unremarkable in appearance.  Lungs/Pleura: Moderate centrilobular emphysema, most pronounced in the lung apices. No acute consolidative airspace disease. No pleural effusions. 5 mm right lower lobe nodule (image 89 of series 6), unchanged compared to prior study from 05/07/2010; this can be considered benign and does not require image followup). No other larger suspicious appearing pulmonary nodules or masses are identified on today's examination.  Upper Abdomen: Unremarkable.  Musculoskeletal: There are no aggressive appearing lytic or blastic lesions noted in the visualized portions of the skeleton.  Review of the MIP images confirms the above findings.  IMPRESSION: 1. Limited examination secondary to patient motion demonstrated no evidence of clinically significant central, lobar or segmental sized pulmonary embolism. Smaller subsegmental sized pulmonary embolism cannot be entirely excluded. 2. No acute findings in the thorax to account for the patient's symptoms. 3. Atherosclerosis, including left main and 3 vessel coronary artery disease.  Assessment for potential risk factor modification, dietary therapy or pharmacologic therapy may be warranted, if clinically indicated. 4. Moderate centrilobular emphysema. 5. Additional incidental findings, as above.   Electronically Signed   By: Vinnie Langton M.D.   On: 11/28/2013 14:15   Dg Chest Portable 1 View  11/28/2013   CLINICAL DATA:  Loss of consciousness  EXAM: PORTABLE CHEST - 1 VIEW  COMPARISON:  11/23/2013  FINDINGS: Chronic interstitial coarsening. No edema or consolidation. No visible effusion or pneumothorax. Normal heart size for technique.  IMPRESSION: No edema or consolidation.   Electronically Signed   By: Jorje Guild M.D.   On: 11/28/2013 02:56   ASSESSMENT / PLAN:  PULMONARY A:  AECOPD Tobacco use disorder PE ruled out HCAP initially suspected  but no significant airspace disease on recent chest CT P:   Goal SpO2>92 Supplemental oxygen D/c BiPAP Albuterol / Atrovent / Pulmicort Solu-Medrol 40 q8h  CARDIOVASCULAR A:  Syncope in setting of respiratory distress HTN P:  Norvasc, Lipitor, ASA  RENAL A:   No active issues P:  Trend BMP D/c IVF  GASTROINTESTINAL A:   GI Px is not indicated Nutrition P: Start diet  HEMATOLOGIC A:  VTE Px P:  Heparin  INFECTIOUS A:   HCAP initially suspected but no significant airspace disease on recent chest CT Coag Neg Staph Aureus in urine GPC in blood  PCT reassuring P:   Simplify abx to Levaquin ( AECOPD) and Vancomycin ( Staph / GPC )  ENDOCRINE A:   DM Hyperglycemia P:  Hold Glipizide, Lantus SSI  NEUROLOGIC A:   Acute encephalopathy, resolved Now evidence of seizure activity on EEG P:   MRI  Transfer to telemetry 3/3.  Maintain on PCCM service.  I have personally obtained history, examined patient, evaluated and interpreted laboratory and imaging results, reviewed medical records, formulated assessment / plan and placed orders.  Doree Fudge, MD Pulmonary and [place] Pager: [phone]  11/29/2013, 11:38 AM

## 2013-11-30 DIAGNOSIS — F17200 Nicotine dependence, unspecified, uncomplicated: Secondary | ICD-10-CM

## 2013-11-30 DIAGNOSIS — E785 Hyperlipidemia, unspecified: Secondary | ICD-10-CM

## 2013-11-30 DIAGNOSIS — J440 Chronic obstructive pulmonary disease with acute lower respiratory infection: Secondary | ICD-10-CM

## 2013-11-30 LAB — BASIC METABOLIC PANEL
BUN: 24 mg/dL — ABNORMAL HIGH (ref 6–23)
CO2: 26 mEq/L (ref 19–32)
Calcium: 8.3 mg/dL — ABNORMAL LOW (ref 8.4–10.5)
Chloride: 97 mEq/L (ref 96–112)
Creatinine, Ser: 0.8 mg/dL (ref 0.50–1.35)
GFR calc Af Amer: >90 mL/min (ref >90)
GFR calc non Af Amer: 87 mL/min — ABNORMAL LOW (ref >90)
Glucose, Bld: 272 mg/dL — ABNORMAL HIGH (ref 70–99)
Potassium: 4.6 mEq/L (ref 3.7–5.3)
Sodium: 135 mEq/L — ABNORMAL LOW (ref 137–147)

## 2013-11-30 LAB — URINE CULTURE: Colony Count: 80000

## 2013-11-30 LAB — CBC
HCT: 46.8 % (ref 39.0–52.0)
Hemoglobin: 16.2 g/dL (ref 13.0–17.0)
MCH: 30.2 pg (ref 26.0–34.0)
MCHC: 34.6 g/dL (ref 30.0–36.0)
MCV: 87.2 fL (ref 78.0–100.0)
Platelets: 218 10*3/uL (ref 150–400)
RBC: 5.37 MIL/uL (ref 4.22–5.81)
RDW: 13 % (ref 11.5–15.5)
WBC: 21.5 10*3/uL — ABNORMAL HIGH (ref 4.0–10.5)

## 2013-11-30 LAB — CULTURE, BLOOD (ROUTINE X 2)

## 2013-11-30 LAB — GLUCOSE, CAPILLARY
Glucose-Capillary: 257 mg/dL — ABNORMAL HIGH (ref 70–99)
Glucose-Capillary: 260 mg/dL — ABNORMAL HIGH (ref 70–99)
Glucose-Capillary: 214 mg/dL — ABNORMAL HIGH (ref 70–99)
Glucose-Capillary: 278 mg/dL — ABNORMAL HIGH (ref 70–99)

## 2013-11-30 MED ORDER — METFORMIN HCL 500 MG PO TABS
500.0000 mg | ORAL_TABLET | Freq: Every day | ORAL | Status: DC
  Administered 2013-11-30 – 2013-12-01 (×2): 500 mg via ORAL
  Filled 2013-11-30 (×3): qty 1

## 2013-11-30 MED ORDER — METFORMIN HCL 500 MG PO TABS
500.0000 mg | ORAL_TABLET | ORAL | Status: DC
Start: 2013-11-30 — End: 2013-11-30

## 2013-11-30 MED ORDER — PREDNISONE 20 MG PO TABS
30.0000 mg | ORAL_TABLET | Freq: Every day | ORAL | Status: DC
  Administered 2013-11-30 – 2013-12-01 (×2): 30 mg via ORAL
  Filled 2013-11-30 (×4): qty 1

## 2013-11-30 MED ORDER — IPRATROPIUM-ALBUTEROL 0.5-2.5 (3) MG/3ML IN SOLN: 3.0000 mL | RESPIRATORY_TRACT | Status: DC | PRN

## 2013-11-30 MED ORDER — INSULIN GLARGINE 100 UNIT/ML ~~LOC~~ SOLN
20.0000 [IU] | Freq: Every day | SUBCUTANEOUS | Status: DC
Start: 2013-11-30 — End: 2013-12-02
  Administered 2013-11-30 – 2013-12-01 (×2): 20 [IU] via SUBCUTANEOUS
  Filled 2013-11-30 (×3): qty 0.2

## 2013-11-30 MED ORDER — TIOTROPIUM BROMIDE MONOHYDRATE 18 MCG IN CAPS
18.0000 ug | ORAL_CAPSULE | Freq: Every day | RESPIRATORY_TRACT | Status: DC
  Administered 2013-11-30 – 2013-12-08 (×7): 18 ug via RESPIRATORY_TRACT
  Filled 2013-11-30 (×2): qty 5

## 2013-11-30 MED ORDER — METFORMIN HCL 500 MG PO TABS
1000.0000 mg | ORAL_TABLET | Freq: Two times a day (BID) | ORAL | Status: DC
  Administered 2013-11-30 – 2013-12-01 (×3): 1000 mg via ORAL
  Filled 2013-11-30 (×6): qty 2

## 2013-11-30 MED ORDER — BUDESONIDE-FORMOTEROL FUMARATE 160-4.5 MCG/ACT IN AERO
2.0000 | INHALATION_SPRAY | Freq: Two times a day (BID) | RESPIRATORY_TRACT | Status: DC
  Administered 2013-11-30 – 2013-12-01 (×2): 2 via RESPIRATORY_TRACT
  Filled 2013-11-30: qty 6

## 2013-11-30 MED ORDER — GLIPIZIDE ER 10 MG PO TB24
10.0000 mg | ORAL_TABLET | Freq: Every day | ORAL | Status: DC
  Administered 2013-12-01: 10 mg via ORAL
  Filled 2013-11-30 (×3): qty 1

## 2013-11-30 MED ORDER — LEVOFLOXACIN 750 MG PO TABS
750.0000 mg | ORAL_TABLET | Freq: Every day | ORAL | Status: DC
  Administered 2013-11-30 – 2013-12-01 (×2): 750 mg via ORAL
  Filled 2013-11-30 (×3): qty 1

## 2013-11-30 NOTE — Progress Notes (Signed)
PULMONARY / CRITICAL CARE MEDICINE  Name: Jonathan [name] MRN: [id number] DOB: 10/30/1940    ADMISSION DATE:  11/28/2013 CONSULTATION DATE: 11/28/2013  REFERRING MD : EDP IMARY SERVICE: PCCM  CHIEF COMPLAINT:  SOB  BRIEF PATIENT DESCRIPTION: [name] ( last seen by RA 2012 ), just discharged 2/28 on steroids and levaquin after being treated for AECOPD  presented in acute resp distress after passing out in bathroom and landing in bath tub.  SIGNIFICANT EVENTS / STUDIES:  3/2  Head CT >>> nad 3/2  TTE >>> EF 60, no RWMA 3/2  EEG >>> nad 3/2  Chest CTA >>> emphysema, no central PE  LINES / TUBES: PIV  CULTURES: 3/2  Blood >>> GPC? >>> 3/2  Urine >>> STAPHYLOCOCCUS SPECIES (COAGULASE NEGATIVE)  ANTIBIOTICS: Vancomycin  3/2 >>> Cefepime  3/2 >>> 3/3 Levaquin 3/3 >>>  INTERVAL HISTORY:   Breathing at baseline.  Denies chest pain.  VITAL SIGNS: Temp:  [98.1 F (36.7 C)-98.6 F (37 C)] 98.5 F (36.9 C) (03/04 0427) Pulse Rate:  [69-94] 81 (03/04 0427) Resp:  [18-33] 28 (03/04 0427) BP: (128-148)/(59-75) 128/62 mmHg (03/04 0947) SpO2:  [92 %-96 %] 94 % (03/04 0836) INTAKE / OUTPUT: Intake/Output     03/03 0701 - 03/04 0700 03/04 0701 - 03/05 0700   I.V. (mL/kg)     IV Piggyback 350    Total Intake(mL/kg) 350 (4.2)    Urine (mL/kg/hr) 1630 (0.8)    Total Output 1630     Net -1280          Urine Occurrence 3 x      PHYSICAL EXAMINATION: General:  No distress HEENT:  No JVD Cardio:  Regular, no murmurs Pul: Diminished air entry, prolonged expiratory phase, no wheeze Abdomen: Soft, bowel sounds present Musculoskeletal:  Moves all extremities Skin:  No rash  LABS:  CBC  Recent Labs Lab 11/28/13 0830 11/29/13 0240 11/30/13 0538  WBC 14.4* 16.8* 21.5*  HGB 16.4 15.6 16.2  HCT 48.7 45.2 46.8  PLT 196 194 218   Coag's  Recent Labs Lab 11/28/13 0732  APTT 26  INR 1.04   BMET  Recent Labs Lab 11/28/13 0830 11/29/13 0240 11/30/13 0538   NA 134* 139 135*  K 4.2 4.3 4.6  CL 92* 98 97  CO2 25 29 26   BUN 20 18 24*  CREATININE 0.92 0.77 0.80  GLUCOSE 371* 191* 272*   Electrolytes  Recent Labs Lab 11/28/13 0732 11/28/13 0830 11/29/13 0240 11/30/13 0538  CALCIUM 8.2* 8.4 8.2* 8.3*  MG 2.1  --   --   --   PHOS 3.3  --   --   --    Sepsis Markers  Recent Labs Lab 11/28/13 0615 11/28/13 0732 11/28/13 0830 11/29/13 0240  LATICACIDVEN 3.8*  --  4.4*  --   PROCALCITON  --  0.14  --  <0.10   ABG  Recent Labs Lab 11/28/13 0336 11/29/13 0540  PHART 7.399 7.413  PCO2ART 47.8* 45.3*  PO2ART 157.0* 71.6*   Liver Enzymes  Recent Labs Lab 11/28/13 0732 11/28/13 0830  AST 24 23  ALT 31 30  ALKPHOS 55 57  BILITOT 1.3* 1.3*  ALBUMIN 3.4* 3.4*   Cardiac Enzymes  Recent Labs Lab 11/23/13 1224 11/28/13 0215 11/28/13 0615 11/28/13 0830  TROPONINI  --  <0.30 <0.30  --   PROBNP 82.0  --   --  463.5*   Glucose  Recent Labs Lab 11/29/13 0742 11/29/13 1149  11/29/13 1546 11/29/13 1737 11/29/13 2204 11/30/13 0742  GLUCAP 168* 204* 273* 247* 313* 257*   IMAGING:  Ct Angio Chest Pe W/cm &/or Wo Cm  11/28/2013   CLINICAL DATA:  Shortness of breath. Found down. Evaluate for potential pulmonary embolism.  EXAM: CT ANGIOGRAPHY CHEST WITH CONTRAST  TECHNIQUE: Multidetector CT imaging of the chest was performed using the standard protocol during bolus administration of intravenous contrast. Multiplanar CT image reconstructions and MIPs were obtained to evaluate the vascular anatomy.  CONTRAST:  128mL OMNIPAQUE IOHEXOL 350 MG/ML SOLN  COMPARISON:  Chest CT 05/07/2010.  FINDINGS: Mediastinum: Study is limited by patient respiratory motion. With these limitations in mind, there is no central, lobar or segmental sized pulmonary embolism. Accurate assessment for smaller subsegmental sized pulmonary embolism is not possible secondary to the limitations of this examination. Heart size is normal. There is no significant  pericardial fluid, thickening or pericardial calcification. There is atherosclerosis of the thoracic aorta, the great vessels of the mediastinum and the coronary arteries, including calcified atherosclerotic plaque in the left main, left anterior descending, left circumflex right coronary arteries. No pathologically enlarged mediastinal or hilar lymph nodes. Esophagus is unremarkable in appearance.  Lungs/Pleura: Moderate centrilobular emphysema, most pronounced in the lung apices. No acute consolidative airspace disease. No pleural effusions. 5 mm right lower lobe nodule (image 89 of series 6), unchanged compared to prior study from 05/07/2010; this can be considered benign and does not require image followup). No other larger suspicious appearing pulmonary nodules or masses are identified on today's examination.  Upper Abdomen: Unremarkable.  Musculoskeletal: There are no aggressive appearing lytic or blastic lesions noted in the visualized portions of the skeleton.  Review of the MIP images confirms the above findings.  IMPRESSION: 1. Limited examination secondary to patient motion demonstrated no evidence of clinically significant central, lobar or segmental sized pulmonary embolism. Smaller subsegmental sized pulmonary embolism cannot be entirely excluded. 2. No acute findings in the thorax to account for the patient's symptoms. 3. Atherosclerosis, including left main and 3 vessel coronary artery disease. Assessment for potential risk factor modification, dietary therapy or pharmacologic therapy may be warranted, if clinically indicated. 4. Moderate centrilobular emphysema. 5. Additional incidental findings, as above.   Electronically Signed   By: Vinnie Langton M.D.   On: 11/28/2013 14:15   Mr Brain Wo Contrast  11/29/2013   CLINICAL DATA:  Confusion.  Hypertension.  COPD.  EXAM: MRI HEAD WITHOUT CONTRAST  TECHNIQUE: Multiplanar, multiecho pulse sequences of the brain and surrounding structures were  obtained without intravenous contrast.  COMPARISON:  CT HEAD W/O CM dated 11/28/2013; MR HEAD W/O CM dated 12/01/2011; MR MRA HEAD W/O CM dated 12/01/2011  FINDINGS: No evidence for acute infarction, hemorrhage, mass lesion, hydrocephalus, or extra-axial fluid. Advanced atrophy. Extensive chronic microvascular ischemic change throughout the periventricular and subcortical white matter. No foci of chronic hemorrhage. Pituitary, pineal, and cerebellar tonsils unremarkable. No upper cervical lesions. Visualized calvarium, skull base, and upper cervical osseous structures unremarkable. Scalp and extracranial soft tissues, orbits, and sinuses show no acute process. Chronic right mastoid fluid likely effusion was demonstrated in 2013.  Left parotid remains atrophic as compared to the right. This appearance was present in 2013 and is stable.  IMPRESSION: Advanced atrophy with extensive small vessel disease. Cyst similar to priors. No acute intracranial findings.  Atrophic left parotid gland.   Electronically Signed   By: Rolla Flatten M.D.   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 Tobacco use disorder. P:   Oxygen to keep SpO2 > 90% Change solumedrol to prednisone on 3/04 Add spiriva, dulera 3/04 PRN albuterol Day 2 of levaquin  A: GPC in blood cx. P: Continue vancomycin pending results  A: Syncope 2nd to respiratory failure. Hx of HTN, hyperlipidemia. 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 P: Resume glipizide, glucophage SSI with lantus  Summary: Will transition to regimen that he can use as outpt.  Will need to assess for home oxygen.  Will need outpt pulmonary follow up after d/c home.  Chesley Mires, MD [facility] Pulmonary/Critical Care 11/30/2013, 10:16 AM Pager:  [phone] After 3pm call: [phone]

## 2013-11-30 NOTE — Progress Notes (Signed)
Inpatient Diabetes Program Recommendations  AACE/ADA: New Consensus Statement on Inpatient Glycemic Control (2013)  Target Ranges:  Prepandial:   less than 140 mg/dL      Peak postprandial:   less than 180 mg/dL (1-2 hours)      Critically ill patients:  140 - 180 mg/dL     Results for Jonathan [name], Jonathan [name] (MRN [id number]) as of 11/30/2013 10:01  Ref. Range 11/29/2013 00:15 11/29/2013 03:50 11/29/2013 07:42 11/29/2013 11:49 11/29/2013 15:46 11/29/2013 17:37 11/29/2013 22:04  Glucose-Capillary Latest Range: 70-99 mg/dL 176 (H) 171 (H) 168 (H) 204 (H) 273 (H) 247 (H) 313 (H)    Results for [name], [name] (MRN [id number]) as of 11/30/2013 10:01  Ref. Range 11/30/2013 07:42  Glucose-Capillary Latest Range: 70-99 mg/dL 257 (H)     Diabetes history: Type 2 diabetes   Outpatient Diabetes medications: Metformin 1000 mg bid, Glipizide XL 10 mg daily, Lantus 40 units q HS    **MD- Please consider restarting at least 1/2 of home dose of Lantus. Consider Lantus 20 units daily.     Will follow. Wyn Quaker RN, MSN, CDE Diabetes Coordinator Inpatient Diabetes Program Team Pager: [phone] (8a-10p)

## 2013-11-30 NOTE — Progress Notes (Signed)
SATURATION QUALIFICATIONS: (This note is used to comply with regulatory documentation for home oxygen)  Patient Saturations on Room Air at Rest = 87%  Patient Saturations on Room Air while Ambulating = 80%  Patient Saturations on 4 Liters of oxygen while Ambulating = 88%  Please briefly explain why patient needs home oxygen:

## 2013-12-01 DIAGNOSIS — R7881 Bacteremia: Secondary | ICD-10-CM

## 2013-12-01 DIAGNOSIS — I10 Essential (primary) hypertension: Secondary | ICD-10-CM

## 2013-12-01 LAB — BASIC METABOLIC PANEL
BUN: 22 mg/dL (ref 6–23)
CO2: 25 mEq/L (ref 19–32)
Calcium: 8.4 mg/dL (ref 8.4–10.5)
Chloride: 97 mEq/L (ref 96–112)
Creatinine, Ser: 0.73 mg/dL (ref 0.50–1.35)
GFR calc Af Amer: >90 mL/min (ref >90)
GFR calc non Af Amer: 90 mL/min — ABNORMAL LOW (ref >90)
Glucose, Bld: 157 mg/dL — ABNORMAL HIGH (ref 70–99)
Potassium: 4.4 mEq/L (ref 3.7–5.3)
Sodium: 136 mEq/L — ABNORMAL LOW (ref 137–147)

## 2013-12-01 LAB — GLUCOSE, CAPILLARY
Glucose-Capillary: 132 mg/dL — ABNORMAL HIGH (ref 70–99)
Glucose-Capillary: 97 mg/dL (ref 70–99)
Glucose-Capillary: 181 mg/dL — ABNORMAL HIGH (ref 70–99)
Glucose-Capillary: 108 mg/dL — ABNORMAL HIGH (ref 70–99)

## 2013-12-01 LAB — CBC
HCT: 45.8 % (ref 39.0–52.0)
Hemoglobin: 15.9 g/dL (ref 13.0–17.0)
MCH: 30.5 pg (ref 26.0–34.0)
MCHC: 34.7 g/dL (ref 30.0–36.0)
MCV: 87.7 fL (ref 78.0–100.0)
Platelets: 222 10*3/uL (ref 150–400)
RBC: 5.22 MIL/uL (ref 4.22–5.81)
RDW: 13.1 % (ref 11.5–15.5)
WBC: 19.8 10*3/uL — ABNORMAL HIGH (ref 4.0–10.5)

## 2013-12-01 MED ORDER — FENTANYL CITRATE 0.05 MG/ML IJ SOLN
50.0000 ug | Freq: Once | INTRAMUSCULAR | Status: AC
  Administered 2013-12-01: 50 ug via INTRAVENOUS

## 2013-12-01 MED ORDER — BUDESONIDE 0.5 MG/2ML IN SUSP
0.5000 mg | Freq: Two times a day (BID) | RESPIRATORY_TRACT | Status: DC
  Administered 2013-12-01 – 2013-12-08 (×12): 0.5 mg via RESPIRATORY_TRACT
  Filled 2013-12-01 (×18): qty 2

## 2013-12-01 MED ORDER — ARFORMOTEROL TARTRATE 15 MCG/2ML IN NEBU
15.0000 ug | INHALATION_SOLUTION | Freq: Two times a day (BID) | RESPIRATORY_TRACT | Status: DC
  Administered 2013-12-01 – 2013-12-08 (×11): 15 ug via RESPIRATORY_TRACT
  Filled 2013-12-01 (×19): qty 2

## 2013-12-01 MED ORDER — FENTANYL CITRATE 0.05 MG/ML IJ SOLN
INTRAMUSCULAR | Status: AC
  Administered 2013-12-01: 50 ug via INTRAVENOUS
  Filled 2013-12-01: qty 2

## 2013-12-01 MED ORDER — ACETAMINOPHEN 650 MG RE SUPP
650.0000 mg | Freq: Four times a day (QID) | RECTAL | Status: DC | PRN
  Administered 2013-12-01 – 2013-12-02 (×2): 650 mg via RECTAL
  Filled 2013-12-01 (×3): qty 1

## 2013-12-01 NOTE — Progress Notes (Signed)
PULMONARY / CRITICAL CARE MEDICINE  Name: Jonathan [name] MRN: [id number] DOB: January 05, 1941    ADMISSION DATE:  11/28/2013 CONSULTATION DATE: 11/28/2013  REFERRING MD : EDP IMARY SERVICE: PCCM  CHIEF COMPLAINT:  SOB  BRIEF PATIENT DESCRIPTION: [name] ( last seen by RA 2012 ), just discharged 2/28 on steroids and levaquin after being treated for AECOPD  presented in acute resp distress after passing out in bathroom and landing in bath tub.  SIGNIFICANT EVENTS / STUDIES:  3/2  Head CT >>> nad 3/2  TTE >>> EF 60, no RWMA 3/2  EEG >>> nad 3/2  Chest CTA >>> emphysema, no central PE 3/3  MRI brain >>> atrophy, small vessel disease  LINES / TUBES: PIV  CULTURES: 3/2  Blood >>> micrococcus 3/2  Urine >>> STAPHYLOCOCCUS SPECIES (COAGULASE NEGATIVE) 3/5  Blood >>>  ANTIBIOTICS: Vancomycin  3/2 >>> Cefepime  3/2 >>> 3/3 Levaquin 3/3 >>>  INTERVAL HISTORY:   Feels more short of breath today.  Has trouble with sleep.  VITAL SIGNS: Temp:  [98.3 F (36.8 C)-98.5 F (36.9 C)] 98.3 F (36.8 C) (03/05 0443) Pulse Rate:  [69-79] 69 (03/05 0443) Resp:  [22-30] 22 (03/05 0443) BP: (132-143)/(71-85) 143/85 mmHg (03/05 1001) SpO2:  [90 %-94 %] 92 % (03/05 0848) INTAKE / OUTPUT: Intake/Output     03/04 0701 - 03/05 0700 03/05 0701 - 03/06 0700   IV Piggyback     Total Intake(mL/kg)     Urine (mL/kg/hr) 1600 (0.8) 200 (0.6)   Total Output 1600 200   Net -1600 -200        Urine Occurrence 1 x      PHYSICAL EXAMINATION: General:  No distress HEENT:  No JVD Cardio:  Regular, no murmurs Pul: Diminished air entry, prolonged expiratory phase, no wheeze Abdomen: Soft, bowel sounds present Musculoskeletal:  Moves all extremities Skin:  No rash  LABS:  CBC  Recent Labs Lab 11/29/13 0240 11/30/13 0538 12/01/13 0440  WBC 16.8* 21.5* 19.8*  HGB 15.6 16.2 15.9  HCT 45.2 46.8 45.8  PLT 194 218 222   Coag's  Recent Labs Lab 11/28/13 0732  APTT 26  INR 1.04    BMET  Recent Labs Lab 11/29/13 0240 11/30/13 0538 12/01/13 0440  NA 139 135* 136*  K 4.3 4.6 4.4  CL 98 97 97  CO2 29 26 25   BUN 18 24* 22  CREATININE 0.77 0.80 0.73  GLUCOSE 191* 272* 157*   Electrolytes  Recent Labs Lab 11/28/13 0732  11/29/13 0240 11/30/13 0538 12/01/13 0440  CALCIUM 8.2*  < > 8.2* 8.3* 8.4  MG 2.1  --   --   --   --   PHOS 3.3  --   --   --   --   < > = values in this interval not displayed.  Sepsis Markers  Recent Labs Lab 11/28/13 0615 11/28/13 0732 11/28/13 0830 11/29/13 0240  LATICACIDVEN 3.8*  --  4.4*  --   PROCALCITON  --  0.14  --  <0.10   ABG  Recent Labs Lab 11/28/13 0336 11/29/13 0540  PHART 7.399 7.413  PCO2ART 47.8* 45.3*  PO2ART 157.0* 71.6*   Liver Enzymes  Recent Labs Lab 11/28/13 0732 11/28/13 0830  AST 24 23  ALT 31 30  ALKPHOS 55 57  BILITOT 1.3* 1.3*  ALBUMIN 3.4* 3.4*   Cardiac Enzymes  Recent Labs Lab 11/28/13 0215 11/28/13 0615 11/28/13 0830  TROPONINI <0.30 <0.30  --  PROBNP  --   --  463.5*   Glucose  Recent Labs Lab 11/29/13 2204 11/30/13 0742 11/30/13 1217 11/30/13 1617 11/30/13 2138 12/01/13 0749  GLUCAP 313* 257* 260* 214* 278* 132*   IMAGING:  Mr Brain Wo Contrast  11/29/2013   CLINICAL DATA:  Confusion.  Hypertension.  COPD.  EXAM: MRI HEAD WITHOUT CONTRAST  TECHNIQUE: Multiplanar, multiecho pulse sequences of the brain and surrounding structures were obtained without intravenous contrast.  COMPARISON:  CT HEAD W/O CM dated 11/28/2013; MR HEAD W/O CM dated 12/01/2011; MR MRA HEAD W/O CM dated 12/01/2011  FINDINGS: No evidence for acute infarction, hemorrhage, mass lesion, hydrocephalus, or extra-axial fluid. Advanced atrophy. Extensive chronic microvascular ischemic change throughout the periventricular and subcortical white matter. No foci of chronic hemorrhage. Pituitary, pineal, and cerebellar tonsils unremarkable. No upper cervical lesions. Visualized calvarium, skull base, and  upper cervical osseous structures unremarkable. Scalp and extracranial soft tissues, orbits, and sinuses show no acute process. Chronic right mastoid fluid likely effusion was demonstrated in 2013.  Left parotid remains atrophic as compared to the right. This appearance was present in 2013 and is stable.  IMPRESSION: Advanced atrophy with extensive small vessel disease. Cyst similar to priors. No acute intracranial findings.  Atrophic left parotid gland.   Electronically Signed   By: Rolla Flatten M.D.   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 Tobacco use disorder. P:   Oxygen to keep SpO2 > 90% Changed to prednisone on 3/04 Added spiriva Change dulera to nebulized pulmicort/brovana 3/05 PRN albuterol Day 3 of levaquin  A: GPC in blood cx >> Micrococcus ? significance. P: Continue vancomycin for now Repeat blood culture 3/05  A: Syncope 2nd to respiratory failure. Hx of HTN, hyperlipidemia. 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 P: Resumed glipizide, glucophage 3/04 SSI with lantus 20 units (half outpt dose)  A: Deconditioning. P: -PT/OT  Chesley Mires, MD [place] 12/01/2013, 10:47 AM Pager:  [phone] After 3pm call: [phone]

## 2013-12-01 NOTE — Significant Event (Signed)
Rapid Response Event Note Called by RN to see pt that was not swallowing a pill Overview: Time Called: 2203 Arrival Time: 2206 Event Type: Other (Comment)  Initial Focused Assessment: On assessment pt is actively having back spasms and is unable to swallow due to the spasms.  Pt is able to MAE and answer questions appropriately.  Contacted Dr. Nelda Marseille to update and ask for prn dose via a secondary route.  Pt repositioned for comfort  Interventions: Tylenol 650mg  pr Fentanyl 25mcg IV  Event Summary: Name of Physician Notified: DR. Nelda Marseille at 2215    at    Outcome: Stayed in room and stabalized     Vala Raffo Hedgecock

## 2013-12-01 NOTE — Progress Notes (Addendum)
Patients daughter Sula Soda at bedside. Has questions and concerns about test results. Can MD please contact her for update at [phone] cell [phone]. Thank you!

## 2013-12-01 NOTE — Evaluation (Signed)
Physical Therapy Evaluation Patient Details Name: Jonathan [name] MRN: [id number] DOB: [birth year] Today's Date: 12/01/2013 Time: 2641-5830 PT Time Calculation (min): 17 min  PT Assessment / Plan / Recommendation History of Present Illness  [name] ( last seen by RA 2012 ), just discharged 2/28 on steroids and levaquin after being treated for AECOPD  presented in acute resp distress after passing out in bathroom and landing in bath tub.  Clinical Impression  Pt presents with significant weakness and decreased activity tolerance.  Will benefit from skilled PT services to address deficits and increase functional mobility.    PT Assessment  Patient needs continued PT services    Follow Up Recommendations  Home health PT    Does the patient have the potential to tolerate intense rehabilitation      Barriers to Discharge        Equipment Recommendations       Recommendations for Other Services     Frequency Min 3X/week    Precautions / Restrictions Precautions Precautions: Fall Restrictions Weight Bearing Restrictions: No   Pertinent Vitals/Pain No c/o pain.  spO2 > 90% on 3L O2 during treatment      Mobility  Bed Mobility Overal bed mobility: Needs Assistance Supine to sit: Min assist General bed mobility comments: increased time, assist for trunk Transfers Equipment used: Rolling walker (2 wheeled) Sit to Stand: Min assist General transfer comment: uses momentum, cues for hand placement, slight lifting assist Ambulation/Gait Ambulation/Gait assistance: Min assist Ambulation Distance (Feet): 20 Feet Assistive device: Rolling walker (2 wheeled) Gait velocity interpretation: Below normal speed for age/gender General Gait Details: cues for safety with RW, steadying assist as pt with tremors throughout mobility.  spO2 96% with gait on 3L O2    Exercises     PT Diagnosis: Difficulty walking;Generalized weakness  PT Problem List: Decreased  strength;Decreased activity tolerance;Decreased balance;Decreased mobility;Decreased knowledge of use of DME;Decreased knowledge of precautions;Cardiopulmonary status limiting activity PT Treatment Interventions: DME instruction;Balance training;Gait training;Neuromuscular re-education;Modalities;Stair training;Patient/family education;Functional mobility training;Therapeutic activities;Wheelchair mobility training;Therapeutic exercise     PT Goals(Current goals can be found in the care plan section) Acute Rehab PT Goals Patient Stated Goal: stay at home this time PT Goal Formulation: With patient/family Time For Goal Achievement: 12/08/13 Potential to Achieve Goals: Good  Visit Information  Last PT Received On: 12/01/13 Assistance Needed: +1 History of Present Illness: 72 yo smoker with COPD ( last seen by RA 2012 ), just discharged 2/28 on steroids and levaquin after being treated for AECOPD  presented in acute resp distress after passing out in bathroom and landing in bath tub.       Prior [place] expects to be discharged to:: Private residence Living Arrangements: Spouse/significant other Available Help at Discharge: Family Type of Home: Mobile home Home Access: Stairs to enter Technical brewer of Steps: 3 Entrance Stairs-Rails: Can reach both Home Layout: One level Home Equipment: Environmental consultant - 4 wheels Prior Function Level of Independence: Independent with assistive device(s) Communication Communication: No difficulties    Cognition  Cognition Arousal/Alertness: Awake/alert Behavior During Therapy: WFL for tasks assessed/performed Overall Cognitive Status: Within Functional Limits for tasks assessed    Extremity/Trunk Assessment Upper Extremity Assessment Upper Extremity Assessment: Generalized weakness Lower Extremity Assessment Lower Extremity Assessment: Generalized weakness Cervical / Trunk Assessment Cervical / Trunk Assessment:  Normal   Balance    End of Session PT - End of Session Equipment Utilized During Treatment: Oxygen Activity Tolerance: Patient limited by  fatigue Patient left: in bed;with call bell/phone within reach;with family/visitor present Nurse Communication: Mobility status  GP     Jonathan [name] 12/01/2013, 1:55 PM

## 2013-12-01 NOTE — Progress Notes (Signed)
eLink Physician-Brief Progress Note Patient Name: Jonathan [name] DOB: [birth year] MRN: [id number]  Date of Service  12/01/2013   HPI/Events of Note   Back pain and spasm.  eICU Interventions  Fentanyl x1 and PR tylenol.   Intervention Category Intermediate Interventions: Other:  Ashaad Gaertner 12/01/2013, 10:18 PM

## 2013-12-02 ENCOUNTER — Inpatient Hospital Stay (HOSPITAL_COMMUNITY): Payer: Medicare Other

## 2013-12-02 DIAGNOSIS — E119 Type 2 diabetes mellitus without complications: Secondary | ICD-10-CM

## 2013-12-02 DIAGNOSIS — R7881 Bacteremia: Secondary | ICD-10-CM

## 2013-12-02 LAB — BASIC METABOLIC PANEL
BUN: 18 mg/dL (ref 6–23)
CO2: 27 mEq/L (ref 19–32)
Calcium: 8.2 mg/dL — ABNORMAL LOW (ref 8.4–10.5)
Chloride: 91 mEq/L — ABNORMAL LOW (ref 96–112)
Creatinine, Ser: 0.7 mg/dL (ref 0.50–1.35)
GFR calc Af Amer: >90 mL/min (ref >90)
GFR calc non Af Amer: >90 mL/min (ref >90)
Glucose, Bld: 80 mg/dL (ref 70–99)
Potassium: 4.1 mEq/L (ref 3.7–5.3)
Sodium: 133 mEq/L — ABNORMAL LOW (ref 137–147)

## 2013-12-02 LAB — POCT I-STAT 3, ART BLOOD GAS (G3+)
Acid-Base Excess: 4 mmol/L — ABNORMAL HIGH (ref 0.0–2.0)
Bicarbonate: 30.2 mEq/L — ABNORMAL HIGH (ref 20.0–24.0)
O2 Saturation: 97 %
Patient temperature: 98.6
TCO2: 32 mmol/L (ref 0–100)
pCO2 arterial: 48.1 mmHg — ABNORMAL HIGH (ref 35.0–45.0)
pH, Arterial: 7.406 (ref 7.350–7.450)
pO2, Arterial: 88 mmHg (ref 80.0–100.0)

## 2013-12-02 LAB — CBC
HCT: 49.5 % (ref 39.0–52.0)
Hemoglobin: 17.3 g/dL — ABNORMAL HIGH (ref 13.0–17.0)
MCH: 30.4 pg (ref 26.0–34.0)
MCHC: 34.9 g/dL (ref 30.0–36.0)
MCV: 86.8 fL (ref 78.0–100.0)
Platelets: 218 10*3/uL (ref 150–400)
RBC: 5.7 MIL/uL (ref 4.22–5.81)
RDW: 13 % (ref 11.5–15.5)
WBC: 18 10*3/uL — ABNORMAL HIGH (ref 4.0–10.5)

## 2013-12-02 LAB — MRSA PCR SCREENING: MRSA by PCR: NEGATIVE

## 2013-12-02 LAB — GLUCOSE, CAPILLARY
Glucose-Capillary: 92 mg/dL (ref 70–99)
Glucose-Capillary: 93 mg/dL (ref 70–99)
Glucose-Capillary: 168 mg/dL — ABNORMAL HIGH (ref 70–99)

## 2013-12-02 LAB — VANCOMYCIN, TROUGH: Vancomycin Tr: 12.7 ug/mL (ref 10.0–20.0)

## 2013-12-02 MED ORDER — INSULIN ASPART 100 UNIT/ML ~~LOC~~ SOLN
0.0000 [IU] | SUBCUTANEOUS | Status: DC
Start: 2013-12-02 — End: 2013-12-03
  Administered 2013-12-02 (×2): 4 [IU] via SUBCUTANEOUS
  Administered 2013-12-03: 11 [IU] via SUBCUTANEOUS
  Administered 2013-12-03: 7 [IU] via SUBCUTANEOUS
  Administered 2013-12-03: 4 [IU] via SUBCUTANEOUS
  Administered 2013-12-03: 15 [IU] via SUBCUTANEOUS

## 2013-12-02 MED ORDER — PANTOPRAZOLE SODIUM 40 MG PO TBEC
40.0000 mg | DELAYED_RELEASE_TABLET | Freq: Every day | ORAL | Status: DC
  Administered 2013-12-03 – 2013-12-04 (×2): 40 mg via ORAL
  Filled 2013-12-02 (×2): qty 1

## 2013-12-02 MED ORDER — LORAZEPAM 2 MG/ML IJ SOLN
INTRAMUSCULAR | Status: AC
  Filled 2013-12-02: qty 1

## 2013-12-02 MED ORDER — METHYLPREDNISOLONE SODIUM SUCC 40 MG IJ SOLR
40.0000 mg | Freq: Three times a day (TID) | INTRAMUSCULAR | Status: DC
  Administered 2013-12-02 – 2013-12-04 (×6): 40 mg via INTRAVENOUS
  Filled 2013-12-02 (×14): qty 1

## 2013-12-02 MED ORDER — LEVOFLOXACIN IN D5W 750 MG/150ML IV SOLN
750.0000 mg | INTRAVENOUS | Status: DC
  Administered 2013-12-03: 750 mg via INTRAVENOUS
  Filled 2013-12-02 (×4): qty 150

## 2013-12-02 MED ORDER — HYDRALAZINE HCL 20 MG/ML IJ SOLN
10.0000 mg | Freq: Once | INTRAMUSCULAR | Status: AC
  Administered 2013-12-02: 18:00:00 via INTRAVENOUS

## 2013-12-02 MED ORDER — VANCOMYCIN HCL IN DEXTROSE 1-5 GM/200ML-% IV SOLN
1000.0000 mg | Freq: Three times a day (TID) | INTRAVENOUS | Status: DC
  Administered 2013-12-02 – 2013-12-04 (×5): 1000 mg via INTRAVENOUS
  Filled 2013-12-02 (×7): qty 200

## 2013-12-02 MED ORDER — LORAZEPAM 2 MG/ML IJ SOLN
1.0000 mg | Freq: Once | INTRAMUSCULAR | Status: AC
  Administered 2013-12-02: 1 mg via INTRAVENOUS

## 2013-12-02 MED ORDER — HEPARIN SODIUM (PORCINE) 5000 UNIT/ML IJ SOLN
5000.0000 [IU] | Freq: Three times a day (TID) | INTRAMUSCULAR | Status: DC
  Administered 2013-12-03 – 2013-12-06 (×9): 5000 [IU] via SUBCUTANEOUS
  Filled 2013-12-02 (×19): qty 1

## 2013-12-02 MED ORDER — LABETALOL HCL 5 MG/ML IV SOLN
10.0000 mg | INTRAVENOUS | Status: DC | PRN
  Filled 2013-12-02: qty 4

## 2013-12-02 MED ORDER — HYDRALAZINE HCL 20 MG/ML IJ SOLN
10.0000 mg | INTRAMUSCULAR | Status: DC | PRN
  Administered 2013-12-02 (×2): 10 mg via INTRAVENOUS
  Filled 2013-12-02 (×2): qty 1

## 2013-12-02 NOTE — Progress Notes (Signed)
PULMONARY / CRITICAL CARE MEDICINE  Name: Jonathan [name] MRN: [id number] DOB: 1940/12/07    ADMISSION DATE:  11/28/2013 CONSULTATION DATE: 11/28/2013  REFERRING MD : EDP IMARY SERVICE: PCCM  CHIEF COMPLAINT:  SOB  BRIEF PATIENT DESCRIPTION: [name] ( last seen by RA 2012 ), just discharged 2/28 on steroids and levaquin after being treated for AECOPD  presented in acute resp distress after passing out in bathroom and landing in bath tub.  SIGNIFICANT EVENTS: 3/06 To ICU for BiPAP  STUDIES:  3/2  Head CT >>> nad 3/2  TTE >>> EF 60, no RWMA 3/2  EEG >>> nad 3/2  Chest CTA >>> emphysema, no central PE 3/3  MRI brain >>> atrophy, small vessel disease  LINES / TUBES: PIV  CULTURES: 3/2  Blood >>> micrococcus 3/2  Urine >>> STAPHYLOCOCCUS SPECIES (COAGULASE NEGATIVE) 3/5  Blood >>>  ANTIBIOTICS: Vancomycin  3/2 >>> Cefepime  3/2 >>> 3/3 Levaquin 3/3 >>>  INTERVAL HISTORY:   More trouble breathing.  More sleepy.  C/o mid back pain.  VITAL SIGNS: Temp:  [97.6 F (36.4 C)-100.1 F (37.8 C)] 100.1 F (37.8 C) (03/06 0936) Pulse Rate:  [69-90] 81 (03/06 1017) Resp:  [20-28] 28 (03/06 1017) BP: (123-162)/(56-83) 162/77 mmHg (03/06 0900) SpO2:  [92 %-96 %] 95 % (03/06 1017) INTAKE / OUTPUT: Intake/Output     03/05 0701 - 03/06 0700 03/06 0701 - 03/07 0700   P.O. 200    I.V. (mL/kg) 72 (0.9)    IV Piggyback 250    Total Intake(mL/kg) 522 (6.3)    Urine (mL/kg/hr) 200 (0.1)    Total Output 200     Net +322          Urine Occurrence 1 x    Stool Occurrence 1 x      PHYSICAL EXAMINATION: General:  Ill appearing HEENT:  No JVD, no sinus tenderness Cardio:  Regular, no murmurs Pul: Diminished air entry, prolonged expiratory phase Abdomen: Soft, bowel sounds present Musculoskeletal:  Moves all extremities Skin:  No rash  LABS:  CBC  Recent Labs Lab 11/30/13 0538 12/01/13 0440 12/02/13 0600  WBC 21.5* 19.8* 18.0*  HGB 16.2 15.9 17.3*  HCT 46.8  45.8 49.5  PLT 218 222 218   Coag's  Recent Labs Lab 11/28/13 0732  APTT 26  INR 1.04   BMET  Recent Labs Lab 11/30/13 0538 12/01/13 0440 12/02/13 0600  NA 135* 136* 133*  K 4.6 4.4 4.1  CL 97 97 91*  CO2 26 25 27   BUN 24* 22 18  CREATININE 0.80 0.73 0.70  GLUCOSE 272* 157* 80   Electrolytes  Recent Labs Lab 11/28/13 0732  11/30/13 0538 12/01/13 0440 12/02/13 0600  CALCIUM 8.2*  < > 8.3* 8.4 8.2*  MG 2.1  --   --   --   --   PHOS 3.3  --   --   --   --   < > = values in this interval not displayed.  Sepsis Markers  Recent Labs Lab 11/28/13 0615 11/28/13 0732 11/28/13 0830 11/29/13 0240  LATICACIDVEN 3.8*  --  4.4*  --   PROCALCITON  --  0.14  --  <0.10   ABG  Recent Labs Lab 11/28/13 0336 11/29/13 0540  PHART 7.399 7.413  PCO2ART 47.8* 45.3*  PO2ART 157.0* 71.6*   Liver Enzymes  Recent Labs Lab 11/28/13 0732 11/28/13 0830  AST 24 23  ALT 31 30  ALKPHOS 55 57  BILITOT 1.3* 1.3*  ALBUMIN 3.4* 3.4*   Cardiac Enzymes  Recent Labs Lab 11/28/13 0215 11/28/13 0615 11/28/13 0830  TROPONINI <0.30 <0.30  --   PROBNP  --   --  463.5*   Glucose  Recent Labs Lab 11/30/13 2138 12/01/13 0749 12/01/13 1149 12/01/13 1700 12/01/13 2119 12/02/13 0752  GLUCAP 278* 132* 97 181* 108* 92   IMAGING:  Dg Chest 2 View  12/02/2013   CLINICAL DATA:  Shortness of Breath  EXAM: CHEST  2 VIEW  COMPARISON:  Chest radiograph November 28, 2013 and chest CT November 28, 2013  FINDINGS: There is a degree of underlying emphysematous change. There is no edema or consolidation. Heart is borderline enlarged with normal pulmonary vascularity. No adenopathy. No bone lesions.  IMPRESSION: No edema or consolidation. Evidence of a degree of underlying emphysematous change. Mild cardiac prominence.   Electronically Signed   By: Lowella Grip M.D.   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 Tobacco  use disorder. P:   Oxygen to keep SpO2 > 90% Change to solumedrol 3/06 BiPAP >> hopefully can avoid intubation Continue spiriva, nebulized pulmicort/brovana 3/05 PRN albuterol  CARDIAC A: Syncope 2nd to respiratory failure. Hx of HTN, hyperlipidemia. P: Monitor hemodynamics Continue ASA, norvasc, lipitor  RENAL A: No acute issues. P: -monitor renal fx, urine outpt, electrolytes  GASTROENTEROLOGY A: Nutrition. P: -NPO except meds until more stable -protonix for SUP  HEMATOLOGY A: Polycythemia likely from hypoxia. P: -f/u CBC -SQ heparin  INFECTIOUS A: GPC in blood cx >> Micrococcus ? Significance. AECOPD. 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 P: Hold glipizide, glucophage, lantus until more stable SSI   NEUROLOGY A: Back pain. Deconditioning. P: -PT/OT when more stable -prn pain meds  Summary: He has worsening respiratory status in setting of severe COPD.  Will transfer to ICU and try on BiPAP.  Updated family about plan >> explained concern is that he may require intubation if not improved with BiPAP.  Discussed goals of care with family >> they would want to continue with all therapies to allow him to recover.  Explained that if he is intubated, it might then be difficult for him to come off ventilator.  CC time 45 minutes.  Chesley Mires, MD [facility] Pulmonary/Critical Care 12/02/2013, 10:32 AM Pager:  [phone] After 3pm call: [phone]

## 2013-12-02 NOTE — Progress Notes (Signed)
ANTIBIOTIC CONSULT NOTE - Follow-Up  Pharmacy Consult for Vancomycin and Levaquin Indication: bacteremia/CONS UTI and AECOPD  Allergies  Allergen Reactions  . Amoxicillin-Pot Clavulanate Other (See Comments)    Severe stomach pain.   . Quinapril Hcl     REACTION: back pain  . Valsartan     REACTION: back pain  . Varenicline Tartrate     REACTION: AMS, hallucinations, night mares    Patient Measurements: Height: 5' 8.11" (173 cm) Weight: 183 lb 13.8 oz (83.4 kg) IBW/kg (Calculated) : 68.65  Vital Signs: Temp: 100.1 F (37.8 C) (03/06 0936) Temp src: Rectal (03/06 0936) BP: 162/77 mmHg (03/06 0900) Pulse Rate: 81 (03/06 1017)  Labs:  Recent Labs  11/30/13 0538 12/01/13 0440 12/02/13 0600  WBC 21.5* 19.8* 18.0*  HGB 16.2 15.9 17.3*  PLT 218 222 218  CREATININE 0.80 0.73 0.70   Estimated Creatinine Clearance: 86.8 ml/min (by C-G formula based on Cr of 0.7).   Microbiology: Recent Results (from the past 720 hour(s))  URINE CULTURE     Status: None   Collection Time    11/23/13  2:32 PM      Result Value Ref Range Status   Specimen Description URINE, CLEAN CATCH   Final   Special Requests NONE   Final   Culture  Setup Time     Final   Value: 11/23/2013 14:54     Performed at [place]     Final   Value: NO GROWTH     Performed at [facility]   Culture     Final   Value: NO GROWTH     Performed at [facility]   Report Status 11/24/2013 FINAL   Final  CULTURE, BLOOD (ROUTINE X 2)     Status: None   Collection Time    11/28/13  6:05 AM      Result Value Ref Range Status   Specimen Description BLOOD FOREARM RIGHT   Final   Special Requests BOTTLES DRAWN AEROBIC AND ANAEROBIC 5CC   Final   Culture  Setup Time     Final   Value: 11/28/2013 10:32     Performed at [facility]   Culture     Final   Value: MICROCOCCUS SPECIES     Note: Standardized susceptibility testing for this organism is not available.      Note: Gram Stain Report Called to,Read Back By and Verified With: JENNIFER B@12 :09PM ON 11/29/13 BY DANTS     Performed at [facility]   Report Status 11/30/2013 FINAL   Final  CULTURE, BLOOD (ROUTINE X 2)     Status: None   Collection Time    11/28/13  6:20 AM      Result Value Ref Range Status   Specimen Description BLOOD HAND RIGHT   Final   Special Requests BOTTLES DRAWN AEROBIC AND ANAEROBIC 5CC   Final   Culture  Setup Time     Final   Value: 11/28/2013 10:32     Performed at [facility]   Culture     Final   Value:        BLOOD CULTURE RECEIVED NO GROWTH TO DATE CULTURE WILL BE HELD FOR 5 DAYS BEFORE ISSUING A FINAL NEGATIVE REPORT     Performed at [facility]   Report Status PENDING   Incomplete  URINE CULTURE     Status: None   Collection Time  11/28/13  6:29 AM      Result Value Ref Range Status   Specimen Description URINE, RANDOM   Final   Special Requests NONE   Final   Culture  Setup Time     Final   Value: 11/28/2013 07:04     Performed at [place]     Final   Value: 80,000 COLONIES/ML     Performed at [facility]   Culture     Final   Value: STAPHYLOCOCCUS SPECIES (COAGULASE NEGATIVE)     Note: RIFAMPIN AND GENTAMICIN SHOULD NOT BE USED AS SINGLE DRUGS FOR TREATMENT OF STAPH INFECTIONS.     Performed at [facility]   Report Status 11/30/2013 FINAL   Final   Organism ID, Bacteria STAPHYLOCOCCUS SPECIES (COAGULASE NEGATIVE)   Final  MRSA PCR SCREENING     Status: None   Collection Time    11/28/13  9:24 AM      Result Value Ref Range Status   MRSA by PCR NEGATIVE  NEGATIVE Final   Comment:            The GeneXpert MRSA Assay (FDA     approved for NASAL specimens     only), is one component of a     comprehensive MRSA colonization     surveillance program. It is not     intended to diagnose MRSA     infection nor to guide or     monitor treatment for     MRSA infections.  CULTURE,  BLOOD (ROUTINE X 2)     Status: None   Collection Time    12/01/13 11:40 AM      Result Value Ref Range Status   Specimen Description BLOOD RIGHT ARM   Final   Special Requests BOTTLES DRAWN AEROBIC AND ANAEROBIC 10CC   Final   Culture  Setup Time     Final   Value: 12/01/2013 16:42     Performed at [facility]   Culture     Final   Value:        BLOOD CULTURE RECEIVED NO GROWTH TO DATE CULTURE WILL BE HELD FOR 5 DAYS BEFORE ISSUING A FINAL NEGATIVE REPORT     Performed at [facility]   Report Status PENDING   Incomplete  CULTURE, BLOOD (ROUTINE X 2)     Status: None   Collection Time    12/01/13 11:52 AM      Result Value Ref Range Status   Specimen Description BLOOD RIGHT HAND   Final   Special Requests BOTTLES DRAWN AEROBIC ONLY 8CC   Final   Culture  Setup Time     Final   Value: 12/01/2013 16:42     Performed at [facility]   Culture     Final   Value:        BLOOD CULTURE RECEIVED NO GROWTH TO DATE CULTURE WILL BE HELD FOR 5 DAYS BEFORE ISSUING A FINAL NEGATIVE REPORT     Performed at [facility]   Report Status PENDING   Incomplete    Assessment: 73 yo male recently admitted for COPD exacerbation and was discharged on prednisone, Levaquin, O2, and bronchodilators. Now, he presents s/p syncopal episode followed by falling in the tub. Pharmacy was consulted to start Levaquin (Day #4) for AECOPD and to continue Vancomycin (Day #5) for GPC in blood/CONS in urine.  3/2 Vanco >> 3/2 Cefepime >>3/2 3/3  Levaquin>>  3/5 BCx2 >> ngtd 3/2 Bldx2 >> micrococcus in 1/2 (contaminant?) 3/2 Urine >> CONS MRSA PCR neg  Goal of Therapy:  Vancomycin trough level 15-20 mcg/ml  Plan:  - Continue vancomycin 1250mg  IV q12h - Continue Levaquin 750mg  IV q24h - Monitor renal function, cultures, WBC, and temp - Obtain vancomycin trough at 1930 on 3/6  Shalaina Guardiola A. Pincus Badder, PharmD Clinical Pharmacist - Resident Pager: [phone] Pharmacy:  [phone] 12/02/2013 11:13 AM

## 2013-12-02 NOTE — Progress Notes (Signed)
EEG Completed; Results Pending  

## 2013-12-02 NOTE — Progress Notes (Signed)
Rapid response RN notified to come see patient.

## 2013-12-02 NOTE — Progress Notes (Signed)
Pt non verbal on assessment. Responsive and following commands. Pt's family stated pt has "not been able to talk since yesterday". Pt had trouble swallowing pills last night. Vital signs taken and temp 99.3 and BP 162/77. Respirations labored. Pt nodded that he was not feeling short of breath, but respirations 22. Pt on 3L 02 Sp02 94%. MD notified and aware. Md verbal okay to hold pills until Md assessment.  

## 2013-12-02 NOTE — Progress Notes (Signed)
S:  Called to bedside to assess patients neuro status.    O:   Filed Vitals:   12/02/13 1045 12/02/13 1145 12/02/13 1245 12/02/13 1300  BP: 183/79 167/76 183/90 173/139  Pulse: 79 82 81 81  Temp:      TempSrc:      Resp: 19 27 26 23   Height:      Weight:      SpO2: 99% 99% 99% 98%   Exam: General: ill appearing adult male Neuro:  Pupils =R, moves extremities but intermittently to command, none to command on RLE but moves entire leg when foot stimulated with touch, no arm drift, speech clear but he is selective about what he answers.  Looks at examiner as if he is disinterested in participating.  Grips equal.  Able to identify number of fingers correctly and family in the room.  Chest:  s1s2 rrr, no m/r/g, mild tachypnea, lungs clear  A: Query Acute Encephalopathy in setting of possible bacteremia (micrococcus noted on prelim blood cultures) vs Acute Neurological event - MRI & CT with advanced atrophy / small vessel disease, negative EEG.  Labs reviewed and essentially negative.    P: -neuro checks Q1 hour x4, then Q4 -neurology consult, appreciate input.  -defer further imaging to Neurology -hold sedating medications -cycle bipap   Noe Gens, NP-C [place] [facility] Pgr: [phone] or [phone]

## 2013-12-02 NOTE — Consult Note (Signed)
NEURO HOSPITALIST CONSULT NOTE    Reason for Consult: encepahlopathy  HPI:                                                                                                                                          Jonathan [name] is an 73 y.o. male brought to the ER after patient's family found patient unconscious in the bathroom. Patient since discharge has been feeling short of breath and last night patient's family heard a sound when they checked patient was found unconscious in the bathtub. Patient had urinated on himself. After she insufflator patient regained his consciousness and when patient's family checked his blood sugar it was around 197. On arrival to ED notes state he was moving all extremities with out difficulty. Family was at bedside this am and noted patient was not speaking and felt he was "not able to speak since yesterday".  Girlfriend tells me he had a stroke last year which affected his left arm and leg.  Since then he has been unable to lift his left arm or leg with full strength.  She is most concerned due to his inability to hold a full conversation at this time and worries he has had another stroke--but states his strength on the left side is unchanged since prior to arrival to hospital.   Patient is alert but does not follow complex commands, showing asterixis, become SOB easily and appears encephalopathic.    Past Medical History  Diagnosis Date  . HTN (hypertension)   . HLD (hyperlipidemia)   . Pulmonary nodule, right     lower lobe  . COPD (chronic obstructive pulmonary disease)   . Rotator cuff injury     right  . Frozen shoulder   . Shoulder pain   . Hyperkalemia   . Dermatitis seborrheica   . Other diseases of lung, not elsewhere classified   . Kidney stone   . Allergic rhinitis   . ED (erectile dysfunction)   . Diabetes mellitus type II   . Asthma   . Tobacco abuse   . Stroke     pt states "last year"    Past Surgical History   Procedure Laterality Date  . Cervical discectomy  1998  . Ett  11/1994  . Tm repair    . Tee without cardioversion  12/03/2011    Procedure: TRANSESOPHAGEAL ECHOCARDIOGRAM (TEE);  Surgeon: Lelon Perla, MD;  Location: [facility] ENDOSCOPY;  Service: Cardiovascular;  Laterality: N/A;    Family History  Problem Relation Age of Onset  . Asthma Sister   . Emphysema Sister      Social History:  reports that he has been smoking Cigarettes.  He has been smoking about 0.20 packs per day. He has never used smokeless tobacco. He  reports that he does not drink alcohol or use illicit drugs.  Allergies  Allergen Reactions  . Amoxicillin-Pot Clavulanate Other (See Comments)    Severe stomach pain.   . Quinapril Hcl     REACTION: back pain  . Valsartan     REACTION: back pain  . Varenicline Tartrate     REACTION: AMS, hallucinations, night mares    MEDICATIONS:                                                                                                                     Scheduled: . amLODipine  5 mg Oral Daily  . arformoterol  15 mcg Nebulization BID  . aspirin  325 mg Oral Daily  . atorvastatin  10 mg Oral Daily  . budesonide (PULMICORT) nebulizer solution  0.5 mg Nebulization BID  . heparin  5,000 Units Subcutaneous 3 times per day  . insulin aspart  0-20 Units Subcutaneous 6 times per day  . levofloxacin (LEVAQUIN) IV  750 mg Intravenous Q24H  . methylPREDNISolone (SOLU-MEDROL) injection  40 mg Intravenous 3 times per day  . pantoprazole  40 mg Oral Q1200  . tiotropium  18 mcg Inhalation Daily  . vancomycin  1,250 mg Intravenous Q12H     ROS:                                                                                                                                       History obtained from girlfriend  General ROS: negative for - chills, fatigue, fever, night sweats, weight gain or weight loss Psychological ROS: negative for - behavioral disorder, hallucinations, memory  difficulties, mood swings or suicidal ideation Ophthalmic ROS: negative for - blurry vision, double vision, eye pain or loss of vision ENT ROS: negative for - epistaxis, nasal discharge, oral lesions, sore throat, tinnitus or vertigo Allergy and Immunology ROS: negative for - hives or itchy/watery eyes Hematological and Lymphatic ROS: negative for - bleeding problems, bruising or swollen lymph nodes Endocrine ROS: negative for - galactorrhea, hair pattern changes, polydipsia/polyuria or temperature intolerance Respiratory ROS: negative for - cough, hemoptysis, shortness of breath or wheezing Cardiovascular ROS: negative for - chest pain, dyspnea on exertion, edema or irregular heartbeat Gastrointestinal ROS: negative for - abdominal pain, diarrhea, hematemesis, nausea/vomiting or stool incontinence Genito-Urinary ROS: negative for - dysuria, hematuria, incontinence or urinary frequency/urgency Musculoskeletal ROS: negative for - joint swelling or muscular weakness  Neurological ROS: as noted in HPI Dermatological ROS: negative for rash and skin lesion changes   Blood pressure 173/139, pulse 81, temperature 100.1 F (37.8 C), temperature source Rectal, resp. rate 23, height 5' 8.11" (1.73 m), weight 83.4 kg (183 lb 13.8 oz), SpO2 98.00%.   Neurologic Examination:                                                                                                      Mental Status: Alert, not oriented, smiles at me when I ask his name but quickly responds to noxious stimuli stating "ouch and do not do that"  squeezes hand when prompted. Looks around the room and tracks my movements.  Speech fluent when speaks.   Cranial Nerves: II: Discs flat bilaterally; Visual fields grossly to blink to threat, pupils equal, round, reactive to light and accommodation III,IV, VI: ptosis not present, extra-ocular motions intact bilaterally V,VII: smile symmetric, facial light touch sensation normal  bilaterally VIII: hearing normal bilaterally IX,X: gag reflex present XI: bilateral shoulder shrug XII: midline tongue extension  Motor: Right : Upper extremity   5/5    Left:     Upper extremity   4/5  Lower extremity   5/5     Lower extremity   4/5 --when held out in extension arms show asterixis Tone and bulk:normal tone throughout; no atrophy noted Sensory: withdraws to pain briskly in all extremities Deep Tendon Reflexes:  Right: Upper Extremity   Left: Upper extremity   biceps (C-5 to C-6) 2/4   biceps (C-5 to C-6) 2/4 tricep (C7) 2/4    triceps (C7) 2/4 Brachioradialis (C6) 2/4  Brachioradialis (C6) 2/4  Lower Extremity Lower Extremity  quadriceps (L-2 to L-4) 2/4   quadriceps (L-2 to L-4) 2/4 Achilles (S1) 1/4   Achilles (S1) 1/4  Plantars: Right: downgoing   Left: downgoing Cerebellar: Unable to assess Gait: unable to assess CV: pulses palpable throughout    Lab Results: Basic Metabolic Panel:  Recent Labs Lab 11/28/13 0732 11/28/13 0830 11/29/13 0240 11/30/13 0538 12/01/13 0440 12/02/13 0600  NA 138 134* 139 135* 136* 133*  K 4.4 4.2 4.3 4.6 4.4 4.1  CL 94* 92* 98 97 97 91*  CO2 25 25 29 26 25 27   GLUCOSE 370* 371* 191* 272* 157* 80  BUN 20 20 18  24* 22 18  CREATININE 0.99 0.92 0.77 0.80 0.73 0.70  CALCIUM 8.2* 8.4 8.2* 8.3* 8.4 8.2*  MG 2.1  --   --   --   --   --   PHOS 3.3  --   --   --   --   --     Liver Function Tests:  Recent Labs Lab 11/28/13 0732 11/28/13 0830  AST 24 23  ALT 31 30  ALKPHOS 55 57  BILITOT 1.3* 1.3*  PROT 6.6 6.5  ALBUMIN 3.4* 3.4*   No results found for this basename: LIPASE, AMYLASE,  in the last 168 hours No results found for this basename: AMMONIA,  in the last 168 hours  CBC:  Recent Labs Lab  11/28/13 0830 11/29/13 0240 11/30/13 0538 12/01/13 0440 12/02/13 0600  WBC 14.4* 16.8* 21.5* 19.8* 18.0*  NEUTROABS 13.0*  --   --   --   --   HGB 16.4 15.6 16.2 15.9 17.3*  HCT 48.7 45.2 46.8 45.8 49.5   MCV 87.6 87.6 87.2 87.7 86.8  PLT 196 194 218 222 218    Cardiac Enzymes:  Recent Labs Lab 11/28/13 0215 11/28/13 0615 11/28/13 0830  CKTOTAL  --   --  127  TROPONINI <0.30 <0.30  --     Lipid Panel:  Recent Labs Lab 11/29/13 0240  CHOL 98  TRIG 96  HDL 53  CHOLHDL 1.8  VLDL 19  LDLCALC 26    CBG:  Recent Labs Lab 12/01/13 1149 12/01/13 1700 12/01/13 2119 12/02/13 0752 12/02/13 1252  GLUCAP 97 181* 108* 92 67    Microbiology: Results for orders placed during the hospital encounter of 11/28/13  CULTURE, BLOOD (ROUTINE X 2)     Status: None   Collection Time    11/28/13  6:05 AM      Result Value Ref Range Status   Specimen Description BLOOD FOREARM RIGHT   Final   Special Requests BOTTLES DRAWN AEROBIC AND ANAEROBIC 5CC   Final   Culture  Setup Time     Final   Value: 11/28/2013 10:32     Performed at [facility]   Culture     Final   Value: MICROCOCCUS SPECIES     Note: Standardized susceptibility testing for this organism is not available.     Note: Gram Stain Report Called to,Read Back By and Verified With: JENNIFER B@12 :09PM ON 11/29/13 BY DANTS     Performed at [facility]   Report Status 11/30/2013 FINAL   Final  CULTURE, BLOOD (ROUTINE X 2)     Status: None   Collection Time    11/28/13  6:20 AM      Result Value Ref Range Status   Specimen Description BLOOD HAND RIGHT   Final   Special Requests BOTTLES DRAWN AEROBIC AND ANAEROBIC 5CC   Final   Culture  Setup Time     Final   Value: 11/28/2013 10:32     Performed at [facility]   Culture     Final   Value:        BLOOD CULTURE RECEIVED NO GROWTH TO DATE CULTURE WILL BE HELD FOR 5 DAYS BEFORE ISSUING A FINAL NEGATIVE REPORT     Performed at [facility]   Report Status PENDING   Incomplete  URINE CULTURE     Status: None   Collection Time    11/28/13  6:29 AM      Result Value Ref Range Status   Specimen Description URINE, RANDOM   Final   Special  Requests NONE   Final   Culture  Setup Time     Final   Value: 11/28/2013 07:04     Performed at [place]     Final   Value: 80,000 COLONIES/ML     Performed at [facility]   Culture     Final   Value: STAPHYLOCOCCUS SPECIES (COAGULASE NEGATIVE)     Note: RIFAMPIN AND GENTAMICIN SHOULD NOT BE USED AS SINGLE DRUGS FOR TREATMENT OF STAPH INFECTIONS.     Performed at [facility]   Report Status 11/30/2013 FINAL   Final   Organism ID, Bacteria STAPHYLOCOCCUS SPECIES (COAGULASE  NEGATIVE)   Final  MRSA PCR SCREENING     Status: None   Collection Time    11/28/13  9:24 AM      Result Value Ref Range Status   MRSA by PCR NEGATIVE  NEGATIVE Final   Comment:            The GeneXpert MRSA Assay (FDA     approved for NASAL specimens     only), is one component of a     comprehensive MRSA colonization     surveillance program. It is not     intended to diagnose MRSA     infection nor to guide or     monitor treatment for     MRSA infections.  CULTURE, BLOOD (ROUTINE X 2)     Status: None   Collection Time    12/01/13 11:40 AM      Result Value Ref Range Status   Specimen Description BLOOD RIGHT ARM   Final   Special Requests BOTTLES DRAWN AEROBIC AND ANAEROBIC 10CC   Final   Culture  Setup Time     Final   Value: 12/01/2013 16:42     Performed at [facility]   Culture     Final   Value:        BLOOD CULTURE RECEIVED NO GROWTH TO DATE CULTURE WILL BE HELD FOR 5 DAYS BEFORE ISSUING A FINAL NEGATIVE REPORT     Performed at [facility]   Report Status PENDING   Incomplete  CULTURE, BLOOD (ROUTINE X 2)     Status: None   Collection Time    12/01/13 11:52 AM      Result Value Ref Range Status   Specimen Description BLOOD RIGHT HAND   Final   Special Requests BOTTLES DRAWN AEROBIC ONLY 8CC   Final   Culture  Setup Time     Final   Value: 12/01/2013 16:42     Performed at [facility]   Culture     Final   Value:         BLOOD CULTURE RECEIVED NO GROWTH TO DATE CULTURE WILL BE HELD FOR 5 DAYS BEFORE ISSUING A FINAL NEGATIVE REPORT     Performed at [facility]   Report Status PENDING   Incomplete  MRSA PCR SCREENING     Status: None   Collection Time    12/02/13 10:26 AM      Result Value Ref Range Status   MRSA by PCR NEGATIVE  NEGATIVE Final   Comment:            The GeneXpert MRSA Assay (FDA     approved for NASAL specimens     only), is one component of a     comprehensive MRSA colonization     surveillance program. It is not     intended to diagnose MRSA     infection nor to guide or     monitor treatment for     MRSA infections.    Coagulation Studies: No results found for this basename: LABPROT, INR,  in the last 72 hours  Imaging: Dg Chest 2 View  12/02/2013   CLINICAL DATA:  Shortness of Breath  EXAM: CHEST  2 VIEW  COMPARISON:  Chest radiograph November 28, 2013 and chest CT November 28, 2013  FINDINGS: There is a degree of underlying emphysematous change. There is no edema or consolidation. Heart is borderline enlarged with normal pulmonary vascularity. No adenopathy.  No bone lesions.  IMPRESSION: No edema or consolidation. Evidence of a degree of underlying emphysematous change. Mild cardiac prominence.   Electronically Signed   By: Lowella Grip M.D.   On: 12/02/2013 08:20       Assessment and plan per attending neurologist  Etta Quill PA-C [facility] [phone]  12/02/2013, 2:17 PM   Assessment/Plan:  73 YO male who returned to hospital in respiratory distress after syncopal episode in bathroom. Patient currently being treated for Staph UTI with [facility] pending. While since admitted girlfriend has noted he is not as responsive and left arm and leg appear to be slightly weaker. MRI brain on 11/29/13 showed no acute infarct. Likely encephalopathy with exacerbation of underlying previous left sided weakness.  However cannot rule out new acute event, as girlfriend is  stating metal status worsened after MRI was taken.   Recommend: 1) MRI --if negative would not do further diagnostic testing 2) EEG 3) Continue to treat UTI and ARF.   I personally participate in this patient's evaluation and management, including formulating above clinical impression and management recommendations.  Rush Farmer M.D. [facility] [phone]

## 2013-12-02 NOTE — Significant Event (Signed)
Rapid Response Event Note  Overview:  Called to assist with patient with decreasing LOC and increasing RR Time Called: 0917 Arrival Time: 0921 Event Type: Neurologic;Respiratory  Initial Focused Assessment: On arrival Dr. Halford Chessman present - patient supine in bed - turned to side - skin hot and dry - opens eyes to name - will answer questions sporadically with one - two words.  Moderate distress.  Distant BS - little air entry noted - abd soft on 3 liter nasal cannula.  CBG 94.  188/78  HR 78 RR 20-28.  Rectal temp done 100.7.     Interventions:  Dr. Halford Chessman present - nebulizer given - for transfer to ICU for BiPap.  Placed on tele - transferred to [place] - placed on Bipap in unit - pt tol well.  Family supported - talked with Dr. Halford Chessman.     Event Summary: Name of Physician Notified: Dr. Halford Chessman at [phone]    at    Outcome: Transferred (Comment) [phone])  Event End Time: 1015  Quin Hoop

## 2013-12-02 NOTE — Progress Notes (Signed)
OT Cancellation Note  Patient Details Name: Jonathan [name] MRN: [id number] DOB: 09-26-1941   Cancelled Treatment:    Reason Eval/Treat Not Completed: Medical issues which prohibited therapy (rapid response and pt being transferred to higher level of care)  Caden Fatica A 12/02/2013, 10:00 AM

## 2013-12-02 NOTE — Progress Notes (Signed)
RT Note-Patient was transferred from 5w with rapid response, placed on bipap once arrived to ICU. Dr. Halford Chessman has seen patient and family.

## 2013-12-02 NOTE — Progress Notes (Signed)
ANTIBIOTIC CONSULT NOTE - Follow-Up  Pharmacy Consult for Vancomycin and Levaquin Indication: bacteremia/CONS UTI and AECOPD  Allergies  Allergen Reactions  . Amoxicillin-Pot Clavulanate Other (See Comments)    Severe stomach pain.   . Quinapril Hcl     REACTION: back pain  . Valsartan     REACTION: back pain  . Varenicline Tartrate     REACTION: AMS, hallucinations, night mares    Patient Measurements: Height: 5' 8.11" (173 cm) Weight: 183 lb 13.8 oz (83.4 kg) IBW/kg (Calculated) : 68.65  Vital Signs: Temp: 100.1 F (37.8 C) (03/06 0936) Temp src: Rectal (03/06 0936) BP: 145/64 mmHg (03/06 2100) Pulse Rate: 110 (03/06 2100)  Labs:  Recent Labs  11/30/13 0538 12/01/13 0440 12/02/13 0600  WBC 21.5* 19.8* 18.0*  HGB 16.2 15.9 17.3*  PLT 218 222 218  CREATININE 0.80 0.73 0.70   Estimated Creatinine Clearance: 86.8 ml/min (by C-G formula based on Cr of 0.7).   Microbiology: Recent Results (from the past 720 hour(s))  URINE CULTURE     Status: None   Collection Time    11/23/13  2:32 PM      Result Value Ref Range Status   Specimen Description URINE, CLEAN CATCH   Final   Special Requests NONE   Final   Culture  Setup Time     Final   Value: 11/23/2013 14:54     Performed at [place]     Final   Value: NO GROWTH     Performed at [facility]   Culture     Final   Value: NO GROWTH     Performed at [facility]   Report Status 11/24/2013 FINAL   Final  CULTURE, BLOOD (ROUTINE X 2)     Status: None   Collection Time    11/28/13  6:05 AM      Result Value Ref Range Status   Specimen Description BLOOD FOREARM RIGHT   Final   Special Requests BOTTLES DRAWN AEROBIC AND ANAEROBIC 5CC   Final   Culture  Setup Time     Final   Value: 11/28/2013 10:32     Performed at [facility]   Culture     Final   Value: MICROCOCCUS SPECIES     Note: Standardized susceptibility testing for this organism is not available.      Note: Gram Stain Report Called to,Read Back By and Verified With: JENNIFER B@12 :09PM ON 11/29/13 BY DANTS     Performed at [facility]   Report Status 11/30/2013 FINAL   Final  CULTURE, BLOOD (ROUTINE X 2)     Status: None   Collection Time    11/28/13  6:20 AM      Result Value Ref Range Status   Specimen Description BLOOD HAND RIGHT   Final   Special Requests BOTTLES DRAWN AEROBIC AND ANAEROBIC 5CC   Final   Culture  Setup Time     Final   Value: 11/28/2013 10:32     Performed at [facility]   Culture     Final   Value:        BLOOD CULTURE RECEIVED NO GROWTH TO DATE CULTURE WILL BE HELD FOR 5 DAYS BEFORE ISSUING A FINAL NEGATIVE REPORT     Performed at [facility]   Report Status PENDING   Incomplete  URINE CULTURE     Status: None   Collection Time  11/28/13  6:29 AM      Result Value Ref Range Status   Specimen Description URINE, RANDOM   Final   Special Requests NONE   Final   Culture  Setup Time     Final   Value: 11/28/2013 07:04     Performed at [place]     Final   Value: 80,000 COLONIES/ML     Performed at [facility]   Culture     Final   Value: STAPHYLOCOCCUS SPECIES (COAGULASE NEGATIVE)     Note: RIFAMPIN AND GENTAMICIN SHOULD NOT BE USED AS SINGLE DRUGS FOR TREATMENT OF STAPH INFECTIONS.     Performed at [facility]   Report Status 11/30/2013 FINAL   Final   Organism ID, Bacteria STAPHYLOCOCCUS SPECIES (COAGULASE NEGATIVE)   Final  MRSA PCR SCREENING     Status: None   Collection Time    11/28/13  9:24 AM      Result Value Ref Range Status   MRSA by PCR NEGATIVE  NEGATIVE Final   Comment:            The GeneXpert MRSA Assay (FDA     approved for NASAL specimens     only), is one component of a     comprehensive MRSA colonization     surveillance program. It is not     intended to diagnose MRSA     infection nor to guide or     monitor treatment for     MRSA infections.   CULTURE, BLOOD (ROUTINE X 2)     Status: None   Collection Time    12/01/13 11:40 AM      Result Value Ref Range Status   Specimen Description BLOOD RIGHT ARM   Final   Special Requests BOTTLES DRAWN AEROBIC AND ANAEROBIC 10CC   Final   Culture  Setup Time     Final   Value: 12/01/2013 16:42     Performed at [facility]   Culture     Final   Value:        BLOOD CULTURE RECEIVED NO GROWTH TO DATE CULTURE WILL BE HELD FOR 5 DAYS BEFORE ISSUING A FINAL NEGATIVE REPORT     Performed at [facility]   Report Status PENDING   Incomplete  CULTURE, BLOOD (ROUTINE X 2)     Status: None   Collection Time    12/01/13 11:52 AM      Result Value Ref Range Status   Specimen Description BLOOD RIGHT HAND   Final   Special Requests BOTTLES DRAWN AEROBIC ONLY 8CC   Final   Culture  Setup Time     Final   Value: 12/01/2013 16:42     Performed at [facility]   Culture     Final   Value:        BLOOD CULTURE RECEIVED NO GROWTH TO DATE CULTURE WILL BE HELD FOR 5 DAYS BEFORE ISSUING A FINAL NEGATIVE REPORT     Performed at [facility]   Report Status PENDING   Incomplete  MRSA PCR SCREENING     Status: None   Collection Time    12/02/13 10:26 AM      Result Value Ref Range Status   MRSA by PCR NEGATIVE  NEGATIVE Final   Comment:            The GeneXpert MRSA Assay (FDA     approved  for NASAL specimens     only), is one component of a     comprehensive MRSA colonization     surveillance program. It is not     intended to diagnose MRSA     infection nor to guide or     monitor treatment for     MRSA infections.    Assessment: 73 yo male on Vancomycin (Day #5) for GPC in blood/CONS in urine. Vancomycin trough subtherapeutic at 12.7, drawn at 1900 - 1930, confirmed with RN. Renal function stable, est. crcl ~ 85 ml/min  3/2 Vanco >> 3/2 Cefepime >>3/2 3/3 Levaquin>>  3/5 BCx2 >> ngtd 3/2 Bldx2 >> micrococcus in 1/2 (contaminant?) 3/2 Urine >> CONS MRSA  PCR neg  Goal of Therapy:  Vancomycin trough level 15-20 mcg/ml  Plan:  - Increase vancomycin 1000mg  IV q8h - Monitor renal function,  - Recheck vancomycin trough after 3-5 doses if continues.  Maryanna Shape, PharmD, BCPS  Clinical Pharmacist  Pager: [phone]   12/02/2013 9:16 PM

## 2013-12-02 NOTE — Progress Notes (Signed)
Respiratory therapy note- Removed Bipap at this time, give face a rest, MD to re assess patient.

## 2013-12-02 NOTE — Progress Notes (Signed)
Change of care. Report given to [facility], Therapist, sports. Nursing to continue to monitor.

## 2013-12-02 NOTE — Progress Notes (Signed)
Report given to RN on 2H for transfer from [phone]

## 2013-12-02 NOTE — Progress Notes (Signed)
Pt unable to follow some commands. Expressive aphasia noted. resp removed bipap patient on N/C 3 L tolerating well. Dr Halford Chessman Notified of pt condition. Family at bedside.

## 2013-12-03 ENCOUNTER — Inpatient Hospital Stay (HOSPITAL_COMMUNITY): Payer: Medicare Other

## 2013-12-03 LAB — URINALYSIS, ROUTINE W REFLEX MICROSCOPIC
Bilirubin Urine: NEGATIVE
Glucose, UA: 500 mg/dL — AB
Ketones, ur: 15 mg/dL — AB
Leukocytes, UA: NEGATIVE
Nitrite: NEGATIVE
Protein, ur: NEGATIVE mg/dL
Specific Gravity, Urine: 1.019 (ref 1.005–1.030)
Urobilinogen, UA: 0.2 mg/dL (ref 0.0–1.0)
pH: 5.5 (ref 5.0–8.0)

## 2013-12-03 LAB — CBC
HCT: 50.8 % (ref 39.0–52.0)
Hemoglobin: 18.2 g/dL — ABNORMAL HIGH (ref 13.0–17.0)
MCH: 30.7 pg (ref 26.0–34.0)
MCHC: 35.8 g/dL (ref 30.0–36.0)
MCV: 85.8 fL (ref 78.0–100.0)
Platelets: 248 10*3/uL (ref 150–400)
RBC: 5.92 MIL/uL — ABNORMAL HIGH (ref 4.22–5.81)
RDW: 13.1 % (ref 11.5–15.5)
WBC: 19.8 10*3/uL — ABNORMAL HIGH (ref 4.0–10.5)

## 2013-12-03 LAB — BASIC METABOLIC PANEL
BUN: 20 mg/dL (ref 6–23)
CO2: 23 mEq/L (ref 19–32)
Calcium: 8.7 mg/dL (ref 8.4–10.5)
Chloride: 87 mEq/L — ABNORMAL LOW (ref 96–112)
Creatinine, Ser: 0.79 mg/dL (ref 0.50–1.35)
GFR calc Af Amer: >90 mL/min (ref >90)
GFR calc non Af Amer: 87 mL/min — ABNORMAL LOW (ref >90)
Glucose, Bld: 156 mg/dL — ABNORMAL HIGH (ref 70–99)
Potassium: 4.6 mEq/L (ref 3.7–5.3)
Sodium: 129 mEq/L — ABNORMAL LOW (ref 137–147)

## 2013-12-03 LAB — OSMOLALITY, URINE: Osmolality, Ur: 433 mOsm/kg (ref 390–1090)

## 2013-12-03 LAB — URINE MICROSCOPIC-ADD ON

## 2013-12-03 LAB — GLUCOSE, CAPILLARY
Glucose-Capillary: 181 mg/dL — ABNORMAL HIGH (ref 70–99)
Glucose-Capillary: 181 mg/dL — ABNORMAL HIGH (ref 70–99)
Glucose-Capillary: 210 mg/dL — ABNORMAL HIGH (ref 70–99)
Glucose-Capillary: 267 mg/dL — ABNORMAL HIGH (ref 70–99)
Glucose-Capillary: 345 mg/dL — ABNORMAL HIGH (ref 70–99)
Glucose-Capillary: 262 mg/dL — ABNORMAL HIGH (ref 70–99)

## 2013-12-03 LAB — OSMOLALITY: Osmolality: 282 mOsm/kg (ref 275–300)

## 2013-12-03 LAB — SODIUM, URINE, RANDOM: Sodium, Ur: <20 mEq/L

## 2013-12-03 MED ORDER — FUROSEMIDE 10 MG/ML IJ SOLN
40.0000 mg | Freq: Two times a day (BID) | INTRAMUSCULAR | Status: DC
  Administered 2013-12-03 (×2): 40 mg via INTRAVENOUS
  Filled 2013-12-03 (×3): qty 4

## 2013-12-03 MED ORDER — INSULIN ASPART 100 UNIT/ML ~~LOC~~ SOLN
0.0000 [IU] | Freq: Three times a day (TID) | SUBCUTANEOUS | Status: DC
  Administered 2013-12-03: 11 [IU] via SUBCUTANEOUS
  Administered 2013-12-04: 15 [IU] via SUBCUTANEOUS
  Administered 2013-12-04 (×2): 20 [IU] via SUBCUTANEOUS
  Administered 2013-12-04: 7 [IU] via SUBCUTANEOUS
  Administered 2013-12-05: 11 [IU] via SUBCUTANEOUS
  Administered 2013-12-05: 4 [IU] via SUBCUTANEOUS
  Administered 2013-12-05: 15 [IU] via SUBCUTANEOUS
  Administered 2013-12-05: 20 [IU] via SUBCUTANEOUS
  Administered 2013-12-06: 11 [IU] via SUBCUTANEOUS
  Administered 2013-12-06: 3 [IU] via SUBCUTANEOUS
  Administered 2013-12-06: 11 [IU] via SUBCUTANEOUS
  Administered 2013-12-07: 3 [IU] via SUBCUTANEOUS
  Administered 2013-12-07: 11 [IU] via SUBCUTANEOUS
  Administered 2013-12-07: 7 [IU] via SUBCUTANEOUS
  Administered 2013-12-07: 15 [IU] via SUBCUTANEOUS
  Administered 2013-12-08: 7 [IU] via SUBCUTANEOUS

## 2013-12-03 MED ORDER — INSULIN GLARGINE 100 UNIT/ML ~~LOC~~ SOLN
10.0000 [IU] | Freq: Two times a day (BID) | SUBCUTANEOUS | Status: DC
  Administered 2013-12-03: 10 [IU] via SUBCUTANEOUS
  Filled 2013-12-03 (×3): qty 0.1

## 2013-12-03 NOTE — Procedures (Signed)
ELECTROENCEPHALOGRAM REPORT  Patient: Jonathan [name]       Room #: 9F81 EEG No. ID: [id number] Age: 73 y.o.        Sex: male Referring Physician: Zebelivitskiy Report Date:  12/03/2013        Interpreting Physician: Anthony Sar  History: [name] is an 73 y.o. male with altered mental status of unclear etiology. Patient has had recent previous stroke that shows no signs of acute recurrent stroke.  Indications for study:  Assess severity of encephalopathy; rule out focal seizure activity.  Technique: This is an 18 channel routine scalp EEG performed at the bedside with bipolar and monopolar montages arranged in accordance to the international 10/20 system of electrode placement.   Description: This EEG recording was performed during wakefulness. Predominate background activity consisted of diffuse low amplitude 1-2 Hz irregular delta activity with superimposed fairly regular 6-7 Hz data activity which was most prominent posteriorly. Photic stimulation was not performed. Hyperventilation was not performed. No epileptiform discharges were recorded.  Interpretation: This EEG is abnormal with moderate generalized continuous nonspecific slowing of cerebral activity. This pattern of slowing can be seen with degenerative as well as metabolic and toxic encephalopathies. No evidence of an epileptic disorder was demonstrated.   Rush Farmer M.D. [facility] [phone]

## 2013-12-03 NOTE — Progress Notes (Signed)
Subjective: Patient had no complaints. No adverse overnight events were reported.  Objective: Current vital signs: BP 118/66  Pulse 91  Temp(Src) 97.3 F (36.3 C) (Oral)  Resp 28  Ht 5' 8.11" (1.73 m)  Wt 83.4 kg (183 lb 13.8 oz)  BMI 27.87 kg/m2  SpO2 100%  Neurologic Exam: Patient was somnolent but fairly easy to arouse. He was well oriented to person as well as time. He was slightly disoriented to place. Speech is commensurate with level of alertness. Patient moved extremities equally with no focal weakness. Muscle tone was normal throughout.  Medications: I have reviewed the patient's current medications.  Assessment/Plan: Encephalopathic state with improving mental status. Patient shows no signs of focal abnormality. EEG on 12/02/2013 showed moderate generalized slowing but was otherwise unremarkable. No evidence of epileptic activity was seen.   Recommend no changes in current management. We'll consider proceeding with MRI if patient does not show significant improvement over the next 24 hours.  C.R. Nicole Kindred, MD [facility] [phone]  12/03/2013  4:19 PM

## 2013-12-03 NOTE — Progress Notes (Signed)
Name: Jonathan [name] STATES MRN: [id number] DOB: 03/17/1941  ELECTRONIC ICU PHYSICIAN NOTE  Problem:  cbgs trending up on SSI and clear liquid  Intervention:  Add lantus 10 units bid   Christinia Gully 12/03/2013, 4:59 PM

## 2013-12-03 NOTE — Progress Notes (Addendum)
PULMONARY / CRITICAL CARE MEDICINE  Name: Jonathan [name] MRN: [id number] DOB: 11-Apr-1941    ADMISSION DATE:  11/28/2013 CONSULTATION DATE: 11/28/2013  REFERRING MD : EDP IMARY SERVICE: PCCM  CHIEF COMPLAINT:  SOB  BRIEF PATIENT DESCRIPTION: [name] ( last seen by RA 2012 ), just discharged 2/28 on steroids and levaquin after being treated for AECOPD  presented in acute resp distress after passing out in bathroom and landing in bath tub.  SIGNIFICANT EVENTS: 3/06 To ICU for BiPAP 3/7- still on NIMV  STUDIES:  3/2  Head CT >>> nad 3/2  TTE >>> EF 60, no RWMA 3/2  EEG >>> nad 3/2  Chest CTA >>> emphysema, no central PE 3/3  MRI brain >>> atrophy, small vessel disease  LINES / TUBES: PIV  CULTURES: 3/2  Blood >>> micrococcus (only 1/2) 3/2  Urine >>> STAPHYLOCOCCUS SPECIES (COAGULASE NEGATIVE) 3/5  Blood >>>  ANTIBIOTICS: Vancomycin  3/2 >>> Cefepime  3/2 >>> 3/3 Levaquin 3/3 >>>  INTERVAL HISTORY:   Still requires NIMV  VITAL SIGNS: Temp:  [98.7 F (37.1 C)-100.1 F (37.8 C)] 98.7 F (37.1 C) (03/07 0400) Pulse Rate:  [74-129] 78 (03/07 0700) Resp:  [18-38] 18 (03/07 0700) BP: (127-230)/(60-139) 142/65 mmHg (03/07 0700) SpO2:  [91 %-100 %] 100 % (03/07 0700) FiO2 (%):  [40 %] 40 % (03/07 0400) INTAKE / OUTPUT: Intake/Output     03/06 0701 - 03/07 0700 03/07 0701 - 03/08 0700   P.O.     I.V. (mL/kg) 950 (11.4)    Other 150    IV Piggyback 900    Total Intake(mL/kg) 2000 (24)    Urine (mL/kg/hr) 225 (0.1)    Total Output 225     Net +1775          Urine Occurrence 6 x      PHYSICAL EXAMINATION: General:  Ill appearing, nimv, no distress HEENT:  No JVD Cardio:  s1 s2 Regular, no murmurs Pul: Diminished throughout Abdomen: Soft, bowel sounds present Musculoskeletal:  Moves all extremities Skin:  No rash  LABS:  CBC  Recent Labs Lab 12/01/13 0440 12/02/13 0600 12/03/13 0255  WBC 19.8* 18.0* 19.8*  HGB 15.9 17.3* 18.2*  HCT 45.8  49.5 50.8  PLT 222 218 248   Coag's  Recent Labs Lab 11/28/13 0732  APTT 26  INR 1.04   BMET  Recent Labs Lab 12/01/13 0440 12/02/13 0600 12/03/13 0255  NA 136* 133* 129*  K 4.4 4.1 4.6  CL 97 91* 87*  CO2 25 27 23   BUN 22 18 20   CREATININE 0.73 0.70 0.79  GLUCOSE 157* 80 156*   Electrolytes  Recent Labs Lab 11/28/13 0732  12/01/13 0440 12/02/13 0600 12/03/13 0255  CALCIUM 8.2*  < > 8.4 8.2* 8.7  MG 2.1  --   --   --   --   PHOS 3.3  --   --   --   --   < > = values in this interval not displayed.  Sepsis Markers  Recent Labs Lab 11/28/13 0615 11/28/13 0732 11/28/13 0830 11/29/13 0240  LATICACIDVEN 3.8*  --  4.4*  --   PROCALCITON  --  0.14  --  <0.10   ABG  Recent Labs Lab 11/28/13 0336 11/29/13 0540 12/02/13 1107  PHART 7.399 7.413 7.406  PCO2ART 47.8* 45.3* 48.1*  PO2ART 157.0* 71.6* 88.0   Liver Enzymes  Recent Labs Lab 11/28/13 0732 11/28/13 0830  AST 24 23  ALT 31 30  ALKPHOS 55 57  BILITOT 1.3* 1.3*  ALBUMIN 3.4* 3.4*   Cardiac Enzymes  Recent Labs Lab 11/28/13 0215 11/28/13 0615 11/28/13 0830  TROPONINI <0.30 <0.30  --   PROBNP  --   --  463.5*   Glucose  Recent Labs Lab 12/01/13 2119 12/02/13 0752 12/02/13 1252 12/02/13 2012 12/02/13 2345 12/03/13 0439  GLUCAP 108* 92 93 168* 181* 181*   IMAGING:  Dg Chest 2 View  12/02/2013   CLINICAL DATA:  Shortness of Breath  EXAM: CHEST  2 VIEW  COMPARISON:  Chest radiograph November 28, 2013 and chest CT November 28, 2013  FINDINGS: There is a degree of underlying emphysematous change. There is no edema or consolidation. Heart is borderline enlarged with normal pulmonary vascularity. No adenopathy. No bone lesions.  IMPRESSION: No edema or consolidation. Evidence of a degree of underlying emphysematous change. Mild cardiac prominence.   Electronically Signed   By: Lowella Grip M.D.   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 Tobacco use disorder. P:   Oxygen to keep SpO2 > 90% Change to solumedrol 3/06, maintain BiPAP >>need this scheduled and need interruption to avoid facial trauma abg this am wnl Continue spiriva, nebulized pulmicort/brovana 3/05 PRN albuterol Consider neg balance to even  CARDIAC A: Syncope 2nd to respiratory failure. Hx of HTN, hyperlipidemia. P: Monitor hemodynamics Continue ASA, norvasc, Lipitor Assess urine studies for volume status  RENAL A:Hyponatremia P: - urine na, osm Serum osm kvo  GASTROENTEROLOGY A: Nutrition P: -NPO except meds until more stable, re val clears in afternoon if NIMV reduced -protonix for SUP -LFT in am   HEMATOLOGY A: Polycythemia likely from hypoxia, unclear volume status P: -f/u CBC again in am -SQ heparin  INFECTIOUS A: Likely contaminant with Micrococcus AECOPD. Staph uti? 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 P: Hold glipizide, glucophage, lantus until more stable SSI  Likely reduce stroids soon  NEUROLOGY A: Back pain. Deconditioning. encephalaopthy improved P: -PT/OT when more stable -prn pain meds Per neuro Reduce roids when able  Ccm time 30 min  Lavon Paganini. Titus Mould, MD, [place] Pgr: [facility]

## 2013-12-04 ENCOUNTER — Inpatient Hospital Stay (HOSPITAL_COMMUNITY): Payer: Medicare Other

## 2013-12-04 LAB — CULTURE, BLOOD (ROUTINE X 2): Culture: NO GROWTH

## 2013-12-04 LAB — GLUCOSE, CAPILLARY
Glucose-Capillary: 233 mg/dL — ABNORMAL HIGH (ref 70–99)
Glucose-Capillary: 378 mg/dL — ABNORMAL HIGH (ref 70–99)
Glucose-Capillary: 388 mg/dL — ABNORMAL HIGH (ref 70–99)
Glucose-Capillary: 316 mg/dL — ABNORMAL HIGH (ref 70–99)

## 2013-12-04 LAB — COMPREHENSIVE METABOLIC PANEL
ALT: 19 U/L (ref 0–53)
AST: 15 U/L (ref 0–37)
Albumin: 3.2 g/dL — ABNORMAL LOW (ref 3.5–5.2)
Alkaline Phosphatase: 53 U/L (ref 39–117)
BUN: 24 mg/dL — ABNORMAL HIGH (ref 6–23)
CO2: 30 mEq/L (ref 19–32)
Calcium: 9.1 mg/dL (ref 8.4–10.5)
Chloride: 89 mEq/L — ABNORMAL LOW (ref 96–112)
Creatinine, Ser: 0.87 mg/dL (ref 0.50–1.35)
GFR calc Af Amer: >90 mL/min (ref >90)
GFR calc non Af Amer: 84 mL/min — ABNORMAL LOW (ref >90)
Glucose, Bld: 171 mg/dL — ABNORMAL HIGH (ref 70–99)
Potassium: 4.2 mEq/L (ref 3.7–5.3)
Sodium: 131 mEq/L — ABNORMAL LOW (ref 137–147)
Total Bilirubin: 0.9 mg/dL (ref 0.3–1.2)
Total Protein: 6.4 g/dL (ref 6.0–8.3)

## 2013-12-04 LAB — CBC WITH DIFFERENTIAL/PLATELET
Basophils Absolute: 0 10*3/uL (ref 0.0–0.1)
Basophils Relative: 0 % (ref 0–1)
Eosinophils Absolute: 0 10*3/uL (ref 0.0–0.7)
Eosinophils Relative: 0 % (ref 0–5)
HCT: 52.4 % — ABNORMAL HIGH (ref 39.0–52.0)
Hemoglobin: 19.3 g/dL — ABNORMAL HIGH (ref 13.0–17.0)
Lymphocytes Relative: 15 % (ref 12–46)
Lymphs Abs: 4 10*3/uL (ref 0.7–4.0)
MCH: 31 pg (ref 26.0–34.0)
MCHC: 36.8 g/dL — ABNORMAL HIGH (ref 30.0–36.0)
MCV: 84.2 fL (ref 78.0–100.0)
Monocytes Absolute: 0.8 10*3/uL (ref 0.1–1.0)
Monocytes Relative: 3 % (ref 3–12)
Neutro Abs: 22.1 10*3/uL — ABNORMAL HIGH (ref 1.7–7.7)
Neutrophils Relative %: 82 % — ABNORMAL HIGH (ref 43–77)
Platelets: 231 10*3/uL (ref 150–400)
RBC: 6.22 MIL/uL — ABNORMAL HIGH (ref 4.22–5.81)
RDW: 13 % (ref 11.5–15.5)
Smear Review: ADEQUATE
WBC: 26.9 10*3/uL — ABNORMAL HIGH (ref 4.0–10.5)

## 2013-12-04 MED ORDER — METHYLPREDNISOLONE SODIUM SUCC 40 MG IJ SOLR
40.0000 mg | Freq: Two times a day (BID) | INTRAMUSCULAR | Status: DC
  Administered 2013-12-04 – 2013-12-05 (×2): 40 mg via INTRAVENOUS
  Filled 2013-12-04 (×4): qty 1

## 2013-12-04 MED ORDER — INSULIN GLARGINE 100 UNIT/ML ~~LOC~~ SOLN
15.0000 [IU] | Freq: Two times a day (BID) | SUBCUTANEOUS | Status: DC
  Administered 2013-12-04 – 2013-12-08 (×9): 15 [IU] via SUBCUTANEOUS
  Filled 2013-12-04 (×10): qty 0.15

## 2013-12-04 NOTE — Progress Notes (Signed)
Patient had no new complaints. He's no longer showing signs of confusion. He is well-oriented to time as well as place. Mental status has returned to normal at this point.  No further neurological intervention is indicated. I will sign off on his care at this point the will gladly reevaluate him if clinically indicated.  Rush Farmer M.D. [facility] [phone]

## 2013-12-04 NOTE — Progress Notes (Signed)
PULMONARY / CRITICAL CARE MEDICINE  Name: Jonathan [name] MRN: [id number] DOB: Feb 10, 1941    ADMISSION DATE:  11/28/2013 CONSULTATION DATE: 11/28/2013  REFERRING MD : EDP IMARY SERVICE: PCCM  CHIEF COMPLAINT:  SOB  BRIEF PATIENT DESCRIPTION: [name] ( last seen by RA 2012 ), just discharged 2/28 on steroids and levaquin after being treated for AECOPD  presented in acute resp distress after passing out in bathroom and landing in bath tub.  SIGNIFICANT EVENTS: 3/06 To ICU for BiPAP 3/7- still on NIMV 3/8- no NIMV, awake  STUDIES:  3/2  Head CT >>> nad 3/2  TTE >>> EF 60, no RWMA 3/2  EEG >>> nad 3/2  Chest CTA >>> emphysema, no central PE 3/3  MRI brain >>> atrophy, small vessel disease 3/7 eeg>>>EEG is abnormal with moderate generalized continuous nonspecific slowing of cerebral activity. This pattern of slowing can be seen with degenerative as well as metabolic and toxic encephalopathies. No evidence of an epileptic disorder was demonstrated. 3/7- renal US>>>No evidence for renal abscess.<BR>2. Stable cyst within the inferior pole of the left kidney   LINES / TUBES: PIV  CULTURES: 3/2  Blood >>> micrococcus (only 1/2) 3/2  Urine >>> STAPHYLOCOCCUS SPECIES (COAGULASE NEGATIVE) 3/5  Blood >>>  ANTIBIOTICS: Vancomycin  3/2 >>> Cefepime  3/2 >>> 3/3 Levaquin 3/3 >>>  INTERVAL HISTORY:   Did extremely well, woke up, follows commands, off NIMV completely  VITAL SIGNS: Temp:  [97.3 F (36.3 C)-98.6 F (37 C)] 98.6 F (37 C) (03/08 0400) Pulse Rate:  [69-95] 72 (03/08 0700) Resp:  [18-31] 19 (03/08 0700) BP: (104-152)/(58-78) 106/60 mmHg (03/08 0700) SpO2:  [99 %-100 %] 100 % (03/08 0700) INTAKE / OUTPUT: Intake/Output     03/07 0701 - 03/08 0700 03/08 0701 - 03/09 0700   I.V. (mL/kg) 280 (3.4)    Other     IV Piggyback 750    Total Intake(mL/kg) 1030 (12.4)    Urine (mL/kg/hr) 2400 (1.2)    Total Output 2400     Net -1370            PHYSICAL  EXAMINATION: General:  No distress HEENT:  No JVD Cardio:  s1 s2 Regular, no murmurs Pul: Diminished throughout, no rhonchi Abdomen: Soft, bowel sounds present Musculoskeletal:  Moves all extremities Skin:  No rash  LABS:  CBC  Recent Labs Lab 12/02/13 0600 12/03/13 0255 12/04/13 0353  WBC 18.0* 19.8* 26.9*  HGB 17.3* 18.2* 19.3*  HCT 49.5 50.8 52.4*  PLT 218 248 231   Coag's  Recent Labs Lab 11/28/13 0732  APTT 26  INR 1.04   BMET  Recent Labs Lab 12/02/13 0600 12/03/13 0255 12/04/13 0353  NA 133* 129* 131*  K 4.1 4.6 4.2  CL 91* 87* 89*  CO2 27 23 30   BUN 18 20 24*  CREATININE 0.70 0.79 0.87  GLUCOSE 80 156* 171*   Electrolytes  Recent Labs Lab 11/28/13 0732  12/02/13 0600 12/03/13 0255 12/04/13 0353  CALCIUM 8.2*  < > 8.2* 8.7 9.1  MG 2.1  --   --   --   --   PHOS 3.3  --   --   --   --   < > = values in this interval not displayed.  Sepsis Markers  Recent Labs Lab 11/28/13 0615 11/28/13 0732 11/28/13 0830 11/29/13 0240  LATICACIDVEN 3.8*  --  4.4*  --   PROCALCITON  --  0.14  --  <0.10  ABG  Recent Labs Lab 11/28/13 0336 11/29/13 0540 12/02/13 1107  PHART 7.399 7.413 7.406  PCO2ART 47.8* 45.3* 48.1*  PO2ART 157.0* 71.6* 88.0   Liver Enzymes  Recent Labs Lab 11/28/13 0732 11/28/13 0830 12/04/13 0353  AST 24 23 15   ALT 31 30 19   ALKPHOS 55 57 53  BILITOT 1.3* 1.3* 0.9  ALBUMIN 3.4* 3.4* 3.2*   Cardiac Enzymes  Recent Labs Lab 11/28/13 0215 11/28/13 0615 11/28/13 0830  TROPONINI <0.30 <0.30  --   PROBNP  --   --  463.5*   Glucose  Recent Labs Lab 12/03/13 0439 12/03/13 0855 12/03/13 1216 12/03/13 1646 12/03/13 2129 12/04/13 0748  GLUCAP 181* 210* 267* 345* 262* 233*   IMAGING:  US Renal  12/03/2013   CLINICAL DATA:  Assess for perinephric abscess.  EXAM: RENAL/URINARY TRACT ULTRASOUND COMPLETE  COMPARISON:  07/09/2009  FINDINGS: Right Kidney:  Length: 12.5 cm. Echogenicity within normal limits. No  mass or hydronephrosis visualized.  Left Kidney:  Length: 3.8 cm. Echogenicity within normal limits. No mass or hydronephrosis visualized. Cyst within the inferior pole is again noted measuring 2.8 x 3.2 x 3.4 cm. This contains some diffuse low level internal echoes. Increased through transmission is identified.  Bladder:  Appears normal for degree of bladder distention.  IMPRESSION: 1. No evidence for renal abscess. 2. Stable cyst within the inferior pole of the left kidney.   Electronically Signed   By: Kerby Moors M.D.   On: 12/03/2013 11:23   Dg Chest Port 1 View  12/04/2013   CLINICAL DATA:  Assess infiltrates, edema  EXAM: PORTABLE CHEST - 1 VIEW  COMPARISON:  12/03/2013  FINDINGS: Unchanged interstitial coarsening and hyperinflation. No edema or consolidation. Normal heart size. No visible effusion. No pneumothorax.  IMPRESSION: 1. No edema or consolidation. 2. COPD.   Electronically Signed   By: Jorje Guild M.D.   On: 12/04/2013 05:32   Dg Chest Port 1 View  12/03/2013   CLINICAL DATA:  Respiratory distress  EXAM: PORTABLE CHEST - 1 VIEW  COMPARISON:  12/02/2013  FINDINGS: The heart size appears normal. There is no pleural effusion or edema identified. No airspace consolidation.  IMPRESSION: 1. Stable radiograph of the chest. 2. No pneumonia or heart failure identified.   Electronically Signed   By: Kerby Moors M.D.   On: 12/03/2013 08:54   ASSESSMENT / PLAN:  PULMONARY A:  Acute on chronic respiratory failure 2nd to AECOPD. Tobacco use disorder. P:   Oxygen to keep SpO2 > 90% Slight reduce steroids BiPAP off Continue spiriva, nebulized pulmicort/brovana 3/05 PRN albuterol Less int prom with neg balance pcxr in am required  CARDIAC A: Syncope 2nd to respiratory failure. Hx of HTN, hyperlipidemia. P: Monitor hemodynamics Continue ASA, norvasc, Lipitor Assess urine studies for volume status - likely euvolemic to dry - no lasix   RENAL A:Hyponatremia P: - likely  euvolemic to dry , Na improved -hold further lasix -kvo however -chem in am   GASTROENTEROLOGY A: Nutrition P: -advanced diet -protonix for SUP  HEMATOLOGY A: Polycythemia likely from hypoxia, unclear volume status P: -f/u CBC again in am, hold further lasix -SQ heparin  INFECTIOUS A: Likely contaminant with Micrococcus AECOPD. 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 P: ssi lantus back, increase  NEUROLOGY A: Back pain. Deconditioning. encephalopathy rapid resolution  P: -PT/OT -appreciate neuro help -eeg reviewed  To sdu  Lavon Paganini. Titus Mould, MD, [place] Pgr: [facility]

## 2013-12-05 ENCOUNTER — Inpatient Hospital Stay (HOSPITAL_COMMUNITY): Payer: Medicare Other

## 2013-12-05 LAB — CBC WITH DIFFERENTIAL/PLATELET
Basophils Absolute: 0 10*3/uL (ref 0.0–0.1)
Basophils Relative: 0 % (ref 0–1)
Eosinophils Absolute: 0 10*3/uL (ref 0.0–0.7)
Eosinophils Relative: 0 % (ref 0–5)
HCT: 48.1 % (ref 39.0–52.0)
Hemoglobin: 17.1 g/dL — ABNORMAL HIGH (ref 13.0–17.0)
Lymphocytes Relative: 12 % (ref 12–46)
Lymphs Abs: 3.9 10*3/uL (ref 0.7–4.0)
MCH: 30.3 pg (ref 26.0–34.0)
MCHC: 35.6 g/dL (ref 30.0–36.0)
MCV: 85.3 fL (ref 78.0–100.0)
Monocytes Absolute: 1 10*3/uL (ref 0.1–1.0)
Monocytes Relative: 3 % (ref 3–12)
Neutro Abs: 27.2 10*3/uL — ABNORMAL HIGH (ref 1.7–7.7)
Neutrophils Relative %: 85 % — ABNORMAL HIGH (ref 43–77)
Platelets: 233 10*3/uL (ref 150–400)
RBC: 5.64 MIL/uL (ref 4.22–5.81)
RDW: 13.2 % (ref 11.5–15.5)
WBC: 32.1 10*3/uL — ABNORMAL HIGH (ref 4.0–10.5)

## 2013-12-05 LAB — BASIC METABOLIC PANEL
BUN: 34 mg/dL — ABNORMAL HIGH (ref 6–23)
BUN: 33 mg/dL — ABNORMAL HIGH (ref 6–23)
CO2: 29 mEq/L (ref 19–32)
CO2: 32 mEq/L (ref 19–32)
Calcium: 8.6 mg/dL (ref 8.4–10.5)
Calcium: 8.7 mg/dL (ref 8.4–10.5)
Chloride: 91 mEq/L — ABNORMAL LOW (ref 96–112)
Chloride: 94 mEq/L — ABNORMAL LOW (ref 96–112)
Creatinine, Ser: 0.96 mg/dL (ref 0.50–1.35)
Creatinine, Ser: 1.07 mg/dL (ref 0.50–1.35)
GFR calc Af Amer: >90 mL/min (ref >90)
GFR calc Af Amer: 77 mL/min — ABNORMAL LOW (ref >90)
GFR calc non Af Amer: 80 mL/min — ABNORMAL LOW (ref >90)
GFR calc non Af Amer: 67 mL/min — ABNORMAL LOW (ref >90)
Glucose, Bld: 389 mg/dL — ABNORMAL HIGH (ref 70–99)
Glucose, Bld: 244 mg/dL — ABNORMAL HIGH (ref 70–99)
Potassium: 5.4 mEq/L — ABNORMAL HIGH (ref 3.7–5.3)
Potassium: 4.4 mEq/L (ref 3.7–5.3)
Sodium: 133 mEq/L — ABNORMAL LOW (ref 137–147)
Sodium: 138 mEq/L (ref 137–147)

## 2013-12-05 LAB — GLUCOSE, CAPILLARY
Glucose-Capillary: 383 mg/dL — ABNORMAL HIGH (ref 70–99)
Glucose-Capillary: 312 mg/dL — ABNORMAL HIGH (ref 70–99)
Glucose-Capillary: 283 mg/dL — ABNORMAL HIGH (ref 70–99)
Glucose-Capillary: 175 mg/dL — ABNORMAL HIGH (ref 70–99)

## 2013-12-05 MED ORDER — PREDNISONE 20 MG PO TABS
40.0000 mg | ORAL_TABLET | Freq: Every day | ORAL | Status: DC
  Administered 2013-12-06 – 2013-12-07 (×2): 40 mg via ORAL
  Filled 2013-12-05 (×3): qty 2

## 2013-12-05 MED ORDER — SODIUM POLYSTYRENE SULFONATE 15 GM/60ML PO SUSP
15.0000 g | Freq: Once | ORAL | Status: AC
  Administered 2013-12-05: 15 g via ORAL
  Filled 2013-12-05: qty 60

## 2013-12-05 NOTE — Progress Notes (Signed)
report called to [place] on [facility]west

## 2013-12-05 NOTE — Progress Notes (Signed)
Physical Therapy Treatment Patient Details Name: Jonathan [name] MRN: [id number] DOB: 30-Dec-1940 Today's Date: 12/05/2013 Time: 7564-3329 PT Time Calculation (min): 26 min  PT Assessment / Plan / Recommendation  History of Present Illness [name] ( last seen by RA 2012 ), just discharged 2/28 on steroids and levaquin after being treated for AECOPD  presented in acute resp distress after passing out in bathroom and landing in bath tub.[name] Pt with back spasm and non verbal with rapid response and transfer to ICU 3/6, encephalopathy   PT Comments   Pt progressing well with mobility able to ambulate in hall and stairs today without LOB but required cueing for safety and RW use. Pt encouraged to mobilize with staff and need for O2 with activity. On 1L sats dropped to 87% but on 2 remained above 92% with activity. Will follow.   Follow Up Recommendations  Home health PT     Does the patient have the potential to tolerate intense rehabilitation     Barriers to Discharge        Equipment Recommendations  Rolling walker with 5" wheels    Recommendations for Other Services    Frequency     Progress towards PT Goals Progress towards PT goals: Progressing toward goals  Plan Current plan remains appropriate    Precautions / Restrictions Precautions Precautions: Fall Restrictions Weight Bearing Restrictions: No   Pertinent Vitals/Pain HR 81-107 sats 92-95% 2L No pain   Mobility  Bed Mobility Overal bed mobility: Needs Assistance Supine to sit: Supervision General bed mobility comments: cues for sequence and increased time Transfers Equipment used: Rolling walker (2 wheeled) Transfers: Sit to/from Stand Sit to Stand: Supervision General transfer comment: cues for hand placement and safety Ambulation/Gait Ambulation/Gait assistance: Min guard Ambulation Distance (Feet): 350 Feet Assistive device: Rolling walker (2 wheeled) Gait Pattern/deviations: Step-through  pattern;Decreased stride length;Trunk flexed Gait velocity interpretation: Below normal speed for age/gender General Gait Details: cues to step into RW and stay within as pt with tendency to push RW far off to his side particularly with turns Stairs: Yes Stairs assistance: Supervision Stair Management: One rail Right;Forwards;Alternating pattern Number of Stairs: 4    Exercises General Exercises - Lower Extremity Long Arc Quad: AROM;Both;Seated;20 reps Hip Flexion/Marching: AROM;Both;Seated;20 reps   PT Diagnosis:    PT Problem List:   PT Treatment Interventions:     PT Goals (current goals can now be found in the care plan section)    Visit Information  Last PT Received On: 12/05/13 Assistance Needed: +1 History of Present Illness: [name] ( last seen by RA 2012 ), just discharged 2/28 on steroids and levaquin after being treated for AECOPD  presented in acute resp distress after passing out in bathroom and landing in bath tub.[name] Pt with back spasm and non verbal with rapid response and transfer to ICU 3/6, encephalopathy    Subjective Data      Cognition  Cognition Arousal/Alertness: Awake/alert Behavior During Therapy: WFL for tasks assessed/performed Overall Cognitive Status: Within Functional Limits for tasks assessed    Balance     End of Session PT - End of Session Equipment Utilized During Treatment: Gait belt;Oxygen Activity Tolerance: Patient tolerated treatment well Patient left: in chair;with call bell/phone within reach Nurse Communication: Mobility status   GP     Lanetta Inch [facility] 12/05/2013, 9:00 AM Elwyn Reach, [place]

## 2013-12-05 NOTE — Progress Notes (Signed)
Inpatient Diabetes Program Recommendations  AACE/ADA: New Consensus Statement on Inpatient Glycemic Control (2013)  Target Ranges:  Prepandial:   less than 140 mg/dL      Peak postprandial:   less than 180 mg/dL (1-2 hours)      Critically ill patients:  140 - 180 mg/dL  Results for Jonathan [name], Jonathan [name] (MRN [id number]) as of 12/05/2013 11:29  Ref. Range 12/04/2013 07:48 12/04/2013 12:05 12/04/2013 16:54 12/04/2013 21:38 12/05/2013 08:37  Glucose-Capillary Latest Range: 70-99 mg/dL 233 (H) 378 (H) 388 (H) 316 (H) 383 (H)   Inpatient Diabetes Program Recommendations Insulin - Basal: Increase Lantus to 20 BID  Thank you  Raoul Pitch BSN, RN,CDE Inpatient Diabetes Coordinator [phone] (team pager)

## 2013-12-05 NOTE — Progress Notes (Signed)
PULMONARY / CRITICAL CARE MEDICINE  Name: SHAWNDELL [name] MRN: [id number] DOB: 1941-05-02    ADMISSION DATE:  11/28/2013 CONSULTATION DATE: 11/28/2013  REFERRING MD : EDP IMARY SERVICE: PCCM  CHIEF COMPLAINT:  SOB  BRIEF PATIENT DESCRIPTION: [name] ( last seen by RA 2012 ), just discharged 2/28 on steroids and levaquin after being treated for AECOPD  presented in acute respiratory distress after passing out in bathroom and landing in bath tub.  SIGNIFICANT EVENTS: 3/6 Transferred to ICU on BiPAP  STUDIES:  3/2  Head CT >>> nad 3/2  TTE >>> EF 60, no RWMA 3/2  EEG >>> nad 3/2  Chest CTA >>> Emphysema, no central PE 3/3  MRI brain >>> Etrophy, small vessel disease 3/7  EEG >>> Metabolic / toxic encephalopathy, no seizures 3/7  Renal US >>> No renal abscess, stable cyst within the inferior pole of the left kidney  LINES / TUBES:  CULTURES: 3/2  Blood >>> Micrococcus (1 of 2 bottles) 3/2  Urine >>> STAPHYLOCOCCUS SPECIES (COAGULASE NEGATIVE) 3/5  Blood >>>  ANTIBIOTICS: Vancomycin  3/2 >>> 3/8 Cefepime  3/2 >>> 3/3 Levaquin 3/3 >>> 3/8  INTERVAL HISTORY:  No complaints this am.  No overnight events. RN reports suboptimal glycemic control.   VITAL SIGNS: Temp:  [97.3 F (36.3 C)-98 F (36.7 C)] 97.9 F (36.6 C) (03/08 2300) Pulse Rate:  [62-104] 62 (03/09 0700) Resp:  [18-32] 18 (03/09 0700) BP: (94-135)/(43-68) 119/59 mmHg (03/09 0700) SpO2:  [96 %-100 %] 97 % (03/09 0743)  INTAKE / OUTPUT: Intake/Output     03/08 0701 - 03/09 0700 03/09 0701 - 03/10 0700   P.O. 1930    I.V. (mL/kg)     IV Piggyback     Total Intake(mL/kg) 1930 (23.1)    Urine (mL/kg/hr) 900 (0.4) 200 (1.4)   Total Output 900 200   Net +1030 -200         PHYSICAL EXAMINATION: General:  Resting in the chair, no distres HEENT:  No JVD Cardio:  Regular, no murmurs Pul: Diminished bilateral air entry, few scattered rhonchi  Abdomen: Soft, bowel sounds present Musculoskeletal:   Moves all extremities, trace edema Skin:  No rash  LABS:  CBC  Recent Labs Lab 12/03/13 0255 12/04/13 0353 12/05/13 0450  WBC 19.8* 26.9* 32.1*  HGB 18.2* 19.3* 17.1*  HCT 50.8 52.4* 48.1  PLT 248 231 233   Coag's No results found for this basename: APTT, INR,  in the last 168 hours  BMET  Recent Labs Lab 12/03/13 0255 12/04/13 0353 12/05/13 0450  NA 129* 131* 133*  K 4.6 4.2 5.4*  CL 87* 89* 91*  CO2 23 30 29   BUN 20 24* 34*  CREATININE 0.79 0.87 0.96  GLUCOSE 156* 171* 389*   Electrolytes  Recent Labs Lab 12/03/13 0255 12/04/13 0353 12/05/13 0450  CALCIUM 8.7 9.1 8.6   Sepsis Markers  Recent Labs Lab 11/29/13 0240  PROCALCITON <0.10   ABG  Recent Labs Lab 11/29/13 0540 12/02/13 1107  PHART 7.413 7.406  PCO2ART 45.3* 48.1*  PO2ART 71.6* 88.0   Liver Enzymes  Recent Labs Lab 12/04/13 0353  AST 15  ALT 19  ALKPHOS 53  BILITOT 0.9  ALBUMIN 3.2*   Cardiac Enzymes No results found for this basename: TROPONINI, PROBNP,  in the last 168 hours  Glucose  Recent Labs Lab 12/03/13 2129 12/04/13 0748 12/04/13 1205 12/04/13 1654 12/04/13 2138 12/05/13 0837  GLUCAP 262* 233* 378* 388* 316*  383*   IMAGING:  US Renal  12/03/2013   CLINICAL DATA:  Assess for perinephric abscess.  EXAM: RENAL/URINARY TRACT ULTRASOUND COMPLETE  COMPARISON:  07/09/2009  FINDINGS: Right Kidney:  Length: 12.5 cm. Echogenicity within normal limits. No mass or hydronephrosis visualized.  Left Kidney:  Length: 3.8 cm. Echogenicity within normal limits. No mass or hydronephrosis visualized. Cyst within the inferior pole is again noted measuring 2.8 x 3.2 x 3.4 cm. This contains some diffuse low level internal echoes. Increased through transmission is identified.  Bladder:  Appears normal for degree of bladder distention.  IMPRESSION: 1. No evidence for renal abscess. 2. Stable cyst within the inferior pole of the left kidney.   Electronically Signed   By: Kerby Moors  M.D.   On: 12/03/2013 11:23   Dg Chest Port 1 View  12/05/2013   CLINICAL DATA:  Shortness of breath.  EXAM: PORTABLE CHEST - 1 VIEW  COMPARISON:  DG CHEST 1V PORT dated 12/04/2013; DG CHEST 2 VIEW dated 12/02/2013; CT ANGIO CHEST W/CM &/OR WO/CM dated 11/28/2013; DG CHEST 2 VIEW dated 11/23/2013  FINDINGS: The mediastinum and hilar structures are normal. Stable cardiomegaly. Normal pulmonary vascularity. Mild interstitial prominence noted in the lung bases. Active pneumonitis cannot be excluded. No pleural effusion or pneumothorax. No acute osseous abnormality.  IMPRESSION: Mild interstitial prominence within the lung bases with low lung volumes. Active pneumonitis cannot be excluded.   Electronically Signed   By: Marcello Moores  Register   On: 12/05/2013 07:37   Dg Chest Port 1 View  12/04/2013   CLINICAL DATA:  Assess infiltrates, edema  EXAM: PORTABLE CHEST - 1 VIEW  COMPARISON:  12/03/2013  FINDINGS: Unchanged interstitial coarsening and hyperinflation. No edema or consolidation. Normal heart size. No visible effusion. No pneumothorax.  IMPRESSION: 1. No edema or consolidation. 2. COPD.   Electronically Signed   By: Jorje Guild M.D.   On: 12/04/2013 05:32   ASSESSMENT / PLAN:  PULMONARY A:  Acute on chronic respiratory failure AECOPD Tobacco use disorder P:   Oxygen to keep SpO2 > 90% D/c BiPAP Spiriva / Pulmicort / Brovana D/c Solu-Medrol Start Prednisone 40 Albuterol PRN Trend CXR  CARDIAC A: Syncope in setting of hypoxia / respiratory failure Hx of HTN, hyperlipidemia. P: ASA, Norvasc, Lipitor Labetalol PRN  RENAL A: Mild hyponatremia Mild hyperkalemia Euvolemic P: Trend BMP Kayexalate 15 x 1 KVO   GASTROENTEROLOGY A: Nutrition GI Px is not required P: Diet D/c Protonix  HEMATOLOGY A: Polycythemia likely from hypoxia GI Px P: Trend CBC Heparin   INFECTIOUS A: Likely contaminant with Micrococcus Leucocytosis but afebrile, with negative cultures and PCT <0.10  reassuring P: Monitor off abx  ENDOCRINE A: DM II Hyperglycemia P: SSI Increase Lantus to 25  NEUROLOGY A: Back pain Deconditioning Ence[phalopathy, resolved P: Neurology signed off, appreciate help PT / OT  Transfer to telemetry under PCCM.  I have personally obtained history, examined patient, evaluated and interpreted laboratory and imaging results, reviewed medical records, formulated assessment / plan and placed orders.  Doree Fudge, MD Pulmonary and [place] Pager: [phone]  12/05/2013, 9:01 AM

## 2013-12-05 NOTE — Evaluation (Signed)
Occupational Therapy Evaluation Patient Details Name: Jonathan [name] MRN: [id number] DOB: [birth year] Today's Date: 12/05/2013 Time: 1325-1350 OT Time Calculation (min): 25 min  OT Assessment / Plan / Recommendation History of present illness [name] ( last seen by RA 2012 ), just discharged 2/28 on steroids and levaquin after being treated for AECOPD  presented in acute resp distress after passing out in bathroom and landing in bath tub.[name] Pt with back spasm and non verbal with rapid response and transfer to ICU 3/6, encephalopathy   Clinical Impression   Pt presents with decreased activity tolerance and impaired balance interfering with ability to perform ADL and ADL transfers independently.  Will follow to instruct in energy conservation, tub and toilet transfers, and self care in preparation for return home with his family.     OT Assessment  Patient needs continued OT Services    Follow Up Recommendations  Home health OT    Barriers to Discharge      Equipment Recommendations       Recommendations for Other Services    Frequency  Min 2X/week    Precautions / Restrictions Precautions Precautions: Fall Restrictions Weight Bearing Restrictions: No   Pertinent Vitals/Pain 3L 02 91-93%, c/o chronic pain in back, repositioned    ADL  Eating/Feeding: Independent Where Assessed - Eating/Feeding: Chair Grooming: Wash/dry hands;Min guard Where Assessed - Grooming: Unsupported standing Upper Body Bathing: Supervision/safety Where Assessed - Upper Body Bathing: Unsupported sitting Lower Body Bathing: Min guard Where Assessed - Lower Body Bathing: Unsupported sitting;Supported sit to stand Upper Body Dressing: Set up Where Assessed - Upper Body Dressing: Unsupported sitting Lower Body Dressing: Min guard Where Assessed - Lower Body Dressing: Unsupported sitting;Supported sit to stand Toilet Transfer: Min Psychiatric nurse Method: Sit to Environmental manager: Bedside commode Toileting - Clothing Manipulation and Hygiene: Modified independent Where Assessed - Best boy and Hygiene: Sit on 3-in-1 or toilet Transfers/Ambulation Related to ADLs: min guard within room ADL Comments: Pt is able to cross his foot over his opposite knee to donn his socks.  Recommended pt consider a tub seat to conserve energy when showering and to use 02 in the shower.    OT Diagnosis: Generalized weakness  OT Problem List: Decreased activity tolerance;Impaired balance (sitting and/or standing);Decreased knowledge of use of DME or AE;Cardiopulmonary status limiting activity;Pain OT Treatment Interventions: Self-care/ADL training;DME and/or AE instruction;Energy conservation;Patient/family education   OT Goals(Current goals can be found in the care plan section) Acute Rehab OT Goals Patient Stated Goal: stay at home this time OT Goal Formulation: With patient Time For Goal Achievement: 12/19/13 Potential to Achieve Goals: Good  Visit Information  Last OT Received On: 12/05/13 Assistance Needed: +1 History of Present Illness: [name] ( last seen by RA 2012 ), just discharged 2/28 on steroids and levaquin after being treated for AECOPD  presented in acute resp distress after passing out in bathroom and landing in bath tub.[name] Pt with back spasm and non verbal with rapid response and transfer to ICU 3/6, encephalopathy       Prior [place] expects to be discharged to:: Private residence Living Arrangements: Spouse/significant other Available Help at Discharge: Family Type of Home: Mobile home Home Access: Stairs to enter Entrance Stairs-Number of Steps: 3 Entrance Stairs-Rails: Can reach both Home Layout: One level Home Equipment: [place] - 4 wheels Additional Comments: 02 was ordered last hospitalization but was not delivered  before pt was readmitted Prior Function Level of  Independence: Independent with assistive device(s) Comments: Uses cane at times. Communication Communication: No difficulties Dominant Hand: Right         Vision/Perception Vision - History Patient Visual Report: No change from baseline   Cognition  Cognition Arousal/Alertness: Awake/alert Behavior During Therapy: WFL for tasks assessed/performed Overall Cognitive Status: Within Functional Limits for tasks assessed    Extremity/Trunk Assessment Upper Extremity Assessment Upper Extremity Assessment: Overall WFL for tasks assessed Lower Extremity Assessment Lower Extremity Assessment: Defer to PT evaluation Cervical / Trunk Assessment Cervical / Trunk Assessment: Normal (chronic back pain)     Mobility Bed Mobility Overal bed mobility: Needs Assistance Bed Mobility: Sit to Supine Sit to supine: Modified independent (Device/Increase time) General bed mobility comments: increased time Transfers Overall transfer level: Needs assistance Transfers: Sit to/from Stand Sit to Stand: Supervision     Exercise     Balance     End of Session OT - End of Session Activity Tolerance: Patient limited by fatigue Patient left: in bed;with call bell/phone within reach  GO     [name] 12/05/2013, 2:13 PM [phone]

## 2013-12-05 NOTE — Progress Notes (Signed)
NURSING PROGRESS NOTE  [name] [id number] Transfer Data: 12/05/2013 5:05 PM Attending Provider: Doree Fudge* JIR:CVELF Tower, MD Code Status: full  Jonathan [name] is a 73 y.o. male patient transferred from [place] -No acute distress noted.  -No complaints of shortness of breath.  -No complaints of chest pain.   Cardiac Monitoring: Box # [id number] place. Cardiac monitor yields:normal sinus rhythm.  Last Documented Vital Signs: Blood pressure 123/66, pulse 88, temperature 97.5 F (36.4 C), temperature source Oral, resp. rate 20, height 5\' 8"  (1.727 m), weight 81.421 kg (179 lb 8 oz), SpO2 92.00%.  IV Fluids:  IV in place, occlusive dsg intact without redness, IV cath wrist left, condition patent and no redness normal saline.   Allergies:  Amoxicillin-pot clavulanate; Quinapril hcl; Valsartan; and Varenicline tartrate  Past Medical History:   has a past medical history of HTN (hypertension); HLD (hyperlipidemia); Pulmonary nodule, right; COPD (chronic obstructive pulmonary disease); Rotator cuff injury; Frozen shoulder; Shoulder pain; Hyperkalemia; Dermatitis seborrheica; Other diseases of lung, not elsewhere classified; Kidney stone; Allergic rhinitis; ED (erectile dysfunction); Diabetes mellitus type II; Asthma; Tobacco abuse; and Stroke.  Past Surgical History:   has past surgical history that includes Cervical discectomy (1998); ETT (11/1994); TM repair; and TEE without cardioversion (12/03/2011).  Social History:   reports that he has been smoking Cigarettes.  He has been smoking about 0.20 packs per day. He has never used smokeless tobacco. He reports that he does not drink alcohol or use illicit drugs.  Skin: dry, intact    Will continue to evaluate and treat per MD orders.

## 2013-12-06 LAB — CBC
HCT: 46.4 % (ref 39.0–52.0)
Hemoglobin: 15.9 g/dL (ref 13.0–17.0)
MCH: 29.8 pg (ref 26.0–34.0)
MCHC: 34.3 g/dL (ref 30.0–36.0)
MCV: 87.1 fL (ref 78.0–100.0)
Platelets: 204 10*3/uL (ref 150–400)
RBC: 5.33 MIL/uL (ref 4.22–5.81)
RDW: 13.4 % (ref 11.5–15.5)
WBC: 22.9 10*3/uL — ABNORMAL HIGH (ref 4.0–10.5)

## 2013-12-06 LAB — GLUCOSE, CAPILLARY
Glucose-Capillary: 123 mg/dL — ABNORMAL HIGH (ref 70–99)
Glucose-Capillary: 266 mg/dL — ABNORMAL HIGH (ref 70–99)
Glucose-Capillary: 407 mg/dL — ABNORMAL HIGH (ref 70–99)
Glucose-Capillary: 354 mg/dL — ABNORMAL HIGH (ref 70–99)
Glucose-Capillary: 258 mg/dL — ABNORMAL HIGH (ref 70–99)

## 2013-12-06 LAB — GLUCOSE, RANDOM: Glucose, Bld: 383 mg/dL — ABNORMAL HIGH (ref 70–99)

## 2013-12-06 LAB — BASIC METABOLIC PANEL
BUN: 30 mg/dL — ABNORMAL HIGH (ref 6–23)
CO2: 31 mEq/L (ref 19–32)
Calcium: 8.4 mg/dL (ref 8.4–10.5)
Chloride: 97 mEq/L (ref 96–112)
Creatinine, Ser: 0.85 mg/dL (ref 0.50–1.35)
GFR calc Af Amer: >90 mL/min (ref >90)
GFR calc non Af Amer: 84 mL/min — ABNORMAL LOW (ref >90)
Glucose, Bld: 115 mg/dL — ABNORMAL HIGH (ref 70–99)
Potassium: 3.7 mEq/L (ref 3.7–5.3)
Sodium: 139 mEq/L (ref 137–147)

## 2013-12-06 MED ORDER — INSULIN ASPART 100 UNIT/ML ~~LOC~~ SOLN
12.0000 [IU] | Freq: Once | SUBCUTANEOUS | Status: AC
Start: 2013-12-06 — End: 2013-12-06
  Administered 2013-12-06: 12 [IU] via INTRAVENOUS

## 2013-12-06 MED ORDER — INSULIN ASPART 100 UNIT/ML ~~LOC~~ SOLN
12.0000 [IU] | Freq: Once | SUBCUTANEOUS | Status: AC
  Administered 2013-12-06: 12 [IU] via INTRAVENOUS

## 2013-12-06 NOTE — Progress Notes (Signed)
Pt's MD ([facility]) paged again in regards to pt's elevated blood glucose. Currently awaiting call back.

## 2013-12-06 NOTE — Progress Notes (Signed)
Pt currently on 2L [place]. O2 stats are 93-94%. Will continue to monitor.

## 2013-12-06 NOTE — Progress Notes (Signed)
Inpatient Diabetes Program Recommendations  AACE/ADA: New Consensus Statement on Inpatient Glycemic Control (2013)  Target Ranges:  Prepandial:   less than 140 mg/dL      Peak postprandial:   less than 180 mg/dL (1-2 hours)      Critically ill patients:  140 - 180 mg/dL   Reason for Visit: Hyperglycemia primarily following meals Has required approximately 20 units correction on average with each cbg check Does not appear that Glipizide is effective. Recommendation: Add meal coverage 4 units tidwc   Thank you, Rosita Kea, RN, CNS, Diabetes Coordinator [phone])

## 2013-12-06 NOTE — Progress Notes (Signed)
Pt blood sugar is 407. MD paged, awaiting orders.

## 2013-12-06 NOTE — Progress Notes (Signed)
eLink Physician-Brief Progress Note Patient Name: Jonathan [name] DOB: 27-May-1941 MRN: [id number]  Date of Service  12/06/2013   HPI/Events of Note   Glu over 400  eICU Interventions  Iv insulin 12 u then repeat cbg   Intervention Category Major Interventions: Electrolyte abnormality - evaluation and management  Raylene Miyamoto. 12/06/2013, 6:49 PM

## 2013-12-06 NOTE — Progress Notes (Signed)
PULMONARY / CRITICAL CARE MEDICINE  Name: Jonathan [name] MRN: [id number] DOB: 12-23-1940    ADMISSION DATE:  11/28/2013 CONSULTATION DATE: 11/28/2013  REFERRING MD : EDP IMARY SERVICE: PCCM  CHIEF COMPLAINT:  SOB  BRIEF PATIENT DESCRIPTION: [name] ( last seen by RA 2012 ), just discharged 2/28 on steroids and levaquin after being treated for AECOPD  presented in acute respiratory distress after passing out in bathroom and landing in bath tub.  SIGNIFICANT EVENTS: 3/6 Transferred to ICU on BiPAP  STUDIES:  3/2  Head CT >>> nad 3/2  TTE >>> EF 60, no RWMA 3/2  EEG >>> nad 3/2  Chest CTA >>> Emphysema, no central PE 3/3  MRI brain >>> Etrophy, small vessel disease 3/7  EEG >>> Metabolic / toxic encephalopathy, no seizures 3/7  Renal US >>> No renal abscess, stable cyst within the inferior pole of the left kidney  LINES / TUBES:  CULTURES: 3/2  Blood >>> Micrococcus (1 of 2 bottles) 3/2  Urine >>> STAPHYLOCOCCUS SPECIES (COAGULASE NEGATIVE) 3/5  Blood >>>  ANTIBIOTICS: Vancomycin  3/2 >>> 3/8 Cefepime  3/2 >>> 3/3 Levaquin 3/3 >>> 3/8  INTERVAL HISTORY:  Feels better today. Sleepy, but he says this is not too different from a normal day. Breathing comfortably on 3 L/min [place].  VITAL SIGNS: Temp:  [97.5 F (36.4 C)-98.2 F (36.8 C)] 98.2 F (36.8 C) (03/10 0551) Pulse Rate:  [62-96] 62 (03/10 0551) Resp:  [18-26] 18 (03/10 0551) BP: (113-135)/(58-67) 118/60 mmHg (03/10 0551) SpO2:  [91 %-97 %] 93 % (03/10 0832) Weight:  [81.421 kg (179 lb 8 oz)] 81.421 kg (179 lb 8 oz) (03/09 1658)  INTAKE / OUTPUT: Intake/Output     03/09 0701 - 03/10 0700 03/10 0701 - 03/11 0700   P.O. 350    Total Intake(mL/kg) 350 (4.3)    Urine (mL/kg/hr) 400 (0.2)    Total Output 400     Net -50          Urine Occurrence  1 x    PHYSICAL EXAMINATION: General:  Male of normal body habitus in no distress HEENT:  No JVD Cardio:  Regular, no murmurs Pul: Diminished bilateral  air entry, few scattered rhonchi  Abdomen: Soft, bowel sounds present Musculoskeletal:  Moves all extremities, no edema noted Skin:  No rash  LABS:  CBC  Recent Labs Lab 12/04/13 0353 12/05/13 0450 12/06/13 0554  WBC 26.9* 32.1* 22.9*  HGB 19.3* 17.1* 15.9  HCT 52.4* 48.1 46.4  PLT 231 233 204   Coag's No results found for this basename: APTT, INR,  in the last 168 hours  BMET  Recent Labs Lab 12/05/13 0450 12/05/13 1630 12/06/13 0554  NA 133* 138 139  K 5.4* 4.4 3.7  CL 91* 94* 97  CO2 29 32 31  BUN 34* 33* 30*  CREATININE 0.96 1.07 0.85  GLUCOSE 389* 244* 115*   Electrolytes  Recent Labs Lab 12/05/13 0450 12/05/13 1630 12/06/13 0554  CALCIUM 8.6 8.7 8.4   Sepsis Markers No results found for this basename: LATICACIDVEN, PROCALCITON, O2SATVEN,  in the last 168 hours ABG  Recent Labs Lab 12/02/13 1107  PHART 7.406  PCO2ART 48.1*  PO2ART 88.0   Liver Enzymes  Recent Labs Lab 12/04/13 0353  AST 15  ALT 19  ALKPHOS 53  BILITOT 0.9  ALBUMIN 3.2*   Cardiac Enzymes No results found for this basename: TROPONINI, PROBNP,  in the last 168 hours  Glucose  Recent Labs Lab 12/04/13 2138 12/05/13 0837 12/05/13 1210 12/05/13 1728 12/05/13 2130 12/06/13 0755  GLUCAP 316* 383* 312* 283* 175* 123*   IMAGING:  Dg Chest Port 1 View  12/05/2013   CLINICAL DATA:  Shortness of breath.  EXAM: PORTABLE CHEST - 1 VIEW  COMPARISON:  DG CHEST 1V PORT dated 12/04/2013; DG CHEST 2 VIEW dated 12/02/2013; CT ANGIO CHEST W/CM &/OR WO/CM dated 11/28/2013; DG CHEST 2 VIEW dated 11/23/2013  FINDINGS: The mediastinum and hilar structures are normal. Stable cardiomegaly. Normal pulmonary vascularity. Mild interstitial prominence noted in the lung bases. Active pneumonitis cannot be excluded. No pleural effusion or pneumothorax. No acute osseous abnormality.  IMPRESSION: Mild interstitial prominence within the lung bases with low lung volumes. Active pneumonitis cannot be  excluded.   Electronically Signed   By: Marcello Moores  Register   On: 12/05/2013 07:37    ASSESSMENT / PLAN:  A:  Acute on chronic respiratory failure (resolving) AECOPD Tobacco use disorder Syncope in setting of hypoxia / respiratory failure (resolved) Hx of HTN, hyperlipidemia. Mild hyponatremia, hyperkalemia (both improved) Likely contaminant with Micrococcus Leucocytosis but afebrile, with negative cultures and PCT <0.10 reassuring DM II Deconditioning Encephalopathy, resolved  P:   Wean O2 as tolerated to keep SpO2 > 92% (has home O2 from last admission but ahs not set it up yet) Spiriva / Pulmicort / Brovana Prednisone 40, slow taper (40mg  daily started 3/10) Albuterol PRN Trend CXR, 2 view prior to d/c ASA, Norvasc, Lipitor D/c PRN labetalol as has not required for 4 days.  Trend BMP Lantus 25 units, SSI PT/OT  Georgann Housekeeper, ACNP [facility]/Critical Care Pager [phone] or [phone]  Agree with above, needs more aggressive pulmonary hygienes.  Taper steroids starting today, PT/OT and mobility.  Patient is on 2L at home and will need that at home as well.  Will evaluate for discharge in the next 24-48 hours.  Patient seen and examined, agree with above note.  I dictated the care and orders written for this patient under my direction.  Rush Farmer, MD [phone]

## 2013-12-07 ENCOUNTER — Inpatient Hospital Stay (HOSPITAL_COMMUNITY): Payer: Medicare Other

## 2013-12-07 LAB — BASIC METABOLIC PANEL
BUN: 24 mg/dL — ABNORMAL HIGH (ref 6–23)
CO2: 32 mEq/L (ref 19–32)
Calcium: 8.5 mg/dL (ref 8.4–10.5)
Chloride: 97 mEq/L (ref 96–112)
Creatinine, Ser: 0.77 mg/dL (ref 0.50–1.35)
GFR calc Af Amer: >90 mL/min (ref >90)
GFR calc non Af Amer: 88 mL/min — ABNORMAL LOW (ref >90)
Glucose, Bld: 119 mg/dL — ABNORMAL HIGH (ref 70–99)
Potassium: 3.4 mEq/L — ABNORMAL LOW (ref 3.7–5.3)
Sodium: 140 mEq/L (ref 137–147)

## 2013-12-07 LAB — GLUCOSE, CAPILLARY
Glucose-Capillary: 211 mg/dL — ABNORMAL HIGH (ref 70–99)
Glucose-Capillary: 244 mg/dL — ABNORMAL HIGH (ref 70–99)
Glucose-Capillary: 273 mg/dL — ABNORMAL HIGH (ref 70–99)
Glucose-Capillary: 321 mg/dL — ABNORMAL HIGH (ref 70–99)

## 2013-12-07 LAB — CULTURE, BLOOD (ROUTINE X 2)
Culture: NO GROWTH
Culture: NO GROWTH

## 2013-12-07 LAB — CBC
HCT: 46.7 % (ref 39.0–52.0)
Hemoglobin: 16.2 g/dL (ref 13.0–17.0)
MCH: 30.2 pg (ref 26.0–34.0)
MCHC: 34.7 g/dL (ref 30.0–36.0)
MCV: 87.1 fL (ref 78.0–100.0)
Platelets: 180 10*3/uL (ref 150–400)
RBC: 5.36 MIL/uL (ref 4.22–5.81)
RDW: 13.5 % (ref 11.5–15.5)
WBC: 17.5 10*3/uL — ABNORMAL HIGH (ref 4.0–10.5)

## 2013-12-07 MED ORDER — INSULIN ASPART 100 UNIT/ML ~~LOC~~ SOLN
4.0000 [IU] | Freq: Three times a day (TID) | SUBCUTANEOUS | Status: DC
  Administered 2013-12-07 – 2013-12-08 (×3): 4 [IU] via SUBCUTANEOUS

## 2013-12-07 MED ORDER — PREDNISONE 20 MG PO TABS
30.0000 mg | ORAL_TABLET | Freq: Every day | ORAL | Status: DC
  Administered 2013-12-08: 30 mg via ORAL
  Filled 2013-12-07 (×2): qty 1

## 2013-12-07 NOTE — Progress Notes (Signed)
Occupational Therapy Treatment Patient Details Name: Jonathan [name] MRN: [id number] DOB: [birth year] Today's Date: 12/07/2013 Time: 6440-3474 OT Time Calculation (min): 35 min  OT Assessment / Plan / Recommendation  History of present illness [name] ( last seen by RA 2012 ), just discharged 2/28 on steroids and levaquin after being treated for AECOPD  presented in acute resp distress after passing out in bathroom and landing in bath tub.[name] Pt with back spasm and non verbal with rapid response and transfer to ICU 3/6, encephalopathy   OT comments  Pt making progress with functional goals, however pt not very motivated and required max encouragement to initiate mobility and ADLs. Pt being very argumentative with his wife about his activity level (lack of) in hospital/at home. Pt states that he is in his 24s and he cant; fo much anymore and will lay in bed and on couch at home if he wants. Pt's wife states that he has become "lazy" and does even take a bath most days. 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, structural and she feels like she is just his caregiver  Follow Up Recommendations  Home health OT    Barriers to Discharge   none    Equipment Recommendations  None recommended by OT    Recommendations for Other Services    Frequency Min 2X/week   Progress towards OT Goals Progress towards OT goals: Progressing toward goals  Plan Discharge plan remains appropriate    Precautions / Restrictions Precautions Precautions: Fall Restrictions Weight Bearing Restrictions: No   Pertinent Vitals/Pain No c/o pain    ADL  Grooming: Wash/dry hands;Min guard;Wash/dry face Where Assessed - Grooming: Unsupported standing Upper Body Bathing: Simulated;Set up Where Assessed - Upper Body Bathing: Unsupported sitting Lower Body Bathing: Min guard Where Assessed - Lower Body Bathing: Supported sit to stand Upper Body Dressing: Performed;Set  up Lower Body Dressing: Performed;Supervision/safety Where Assessed - Lower Body Dressing: Unsupported sitting Toilet Transfer: Min guard;Supervision/safety;Performed Toilet Transfer Method: Sit to Loss adjuster, chartered: Therapist, occupational and Hygiene: Performed;Supervision/safety Where Assessed - Best boy and Hygiene: Standing Transfers/Ambulation Related to ADLs: pt requires increased time,  initially refusing to use RW. Pt not very motivated ADL Comments: Pt required encouragment to initiate ADLs, pt not very  motivated. Pt's wife states that he does not do a very good job of bathing at home (does not bathe at times)    OT Diagnosis:    OT Problem List:   OT Treatment Interventions:     OT Goals(current goals can now be found in the care plan section)    Visit Information  Last OT Received On: 12/07/13 Assistance Needed: +1 History of Present Illness: [name] ( last seen by RA 2012 ), just discharged 2/28 on steroids and levaquin after being treated for AECOPD  presented in acute resp distress after passing out in bathroom and landing in bath tub.[name] Pt with back spasm and non verbal with rapid response and transfer to ICU 3/6, encephalopathy                 Cognition  Cognition Arousal/Alertness: Awake/alert Behavior During Therapy: WFL for tasks assessed/performed Overall Cognitive Status: Within Functional Limits for tasks assessed    Mobility  Bed Mobility Overal bed mobility: Needs Assistance Bed Mobility: Sit to Supine Supine to sit: Supervision Sit to supine: Modified independent (Device/Increase time) General bed  mobility comments: increased time Transfers Overall transfer level: Needs assistance Equipment used: Rolling walker (2 wheeled);1 person hand held assist Transfers: Sit to/from Stand Sit to Stand: Supervision General transfer comment: cues for hand placement and safety     Exercises  Other Exercises Other Exercises: Pt able to verbalize and demo 2 energy conservation techniques during ADLs   Balance Balance Overall balance assessment: Needs assistance Sitting-balance support: No upper extremity supported;Feet supported Sitting balance-Leahy Scale: Good  End of Session OT - End of Session Equipment Utilized During Treatment: Rolling walker;Other (comment) (BSC) Activity Tolerance: Patient tolerated treatment well Patient left: in bed;with call bell/phone within reach;with family/visitor present  GO     Britt Bottom 12/07/2013, 1:14 PM

## 2013-12-07 NOTE — Progress Notes (Signed)
PULMONARY / CRITICAL CARE MEDICINE  Name: Jonathan [name] MRN: [id number] DOB: 04-07-1941    ADMISSION DATE:  11/28/2013 CONSULTATION DATE: 11/28/2013  REFERRING MD : EDP IMARY SERVICE: PCCM  CHIEF COMPLAINT:  SOB  BRIEF PATIENT DESCRIPTION: [name] ( last seen by RA 2012 ), just discharged 2/28 on steroids and levaquin after being treated for AECOPD  presented in acute respiratory distress after passing out in bathroom and landing in bath tub.   SIGNIFICANT EVENTS: 3/6 Transferred to ICU on BiPAP  STUDIES:  3/2  Head CT >>> nad 3/2  TTE >>> EF 60, no RWMA 3/2  EEG >>> nad 3/2  Chest CTA >>> Emphysema, no central PE 3/3  MRI brain >>> Etrophy, small vessel disease 3/7  EEG >>> Metabolic / toxic encephalopathy, no seizures 3/7  Renal US >>> No renal abscess, stable cyst within the inferior pole of the left kidney  LINES / TUBES: PIV  CULTURES: 3/2  Blood >>> Micrococcus (1 of 2 bottles) 3/2  Urine >>> STAPHYLOCOCCUS SPECIES (COAGULASE NEGATIVE) 3/5  Blood >>> NEG  ANTIBIOTICS: Vancomycin  3/2 >>> 3/8 Cefepime  3/2 >>> 3/3 Levaquin 3/3 >>> 3/8  INTERVAL HISTORY: Feels about the same today. Anxious to go home, wife feels he is not ready. Hyperglycemic episode last night 407.   VITAL SIGNS: Temp:  [97.5 F (36.4 C)-97.7 F (36.5 C)] 97.5 F (36.4 C) (03/11 0616) Pulse Rate:  [60-85] 60 (03/11 0616) Resp:  [16-18] 18 (03/11 0616) BP: (123-127)/(60-72) 124/60 mmHg (03/11 0616) SpO2:  [91 %-98 %] 98 % (03/11 0747) On 2L/min   INTAKE / OUTPUT: Intake/Output     03/10 0701 - 03/11 0700 03/11 0701 - 03/12 0700   P.O.     Total Intake(mL/kg)     Urine (mL/kg/hr) 350 (0.2)    Total Output 350     Net -350          Urine Occurrence 2 x    Stool Occurrence 1 x     PHYSICAL EXAMINATION: General:  Male of normal body habitus in no distress HEENT:  No JVD Cardio:  Regular, no murmurs Pul: Diminished bilateral air entry, few scattered rhonchi  Abdomen:  Soft, bowel sounds present Musculoskeletal:  Moves all extremities, no edema noted Skin:  No rash  LABS:  CBC  Recent Labs Lab 12/05/13 0450 12/06/13 0554 12/07/13 0610  WBC 32.1* 22.9* 17.5*  HGB 17.1* 15.9 16.2  HCT 48.1 46.4 46.7  PLT 233 204 180   Coag's No results found for this basename: APTT, INR,  in the last 168 hours  BMET  Recent Labs Lab 12/05/13 1630 12/06/13 0554 12/06/13 1957 12/07/13 0610  NA 138 139  --  140  K 4.4 3.7  --  3.4*  CL 94* 97  --  97  CO2 32 31  --  32  BUN 33* 30*  --  24*  CREATININE 1.07 0.85  --  0.77  GLUCOSE 244* 115* 383* 119*   Electrolytes  Recent Labs Lab 12/05/13 1630 12/06/13 0554 12/07/13 0610  CALCIUM 8.7 8.4 8.5   Sepsis Markers No results found for this basename: LATICACIDVEN, PROCALCITON, O2SATVEN,  in the last 168 hours ABG  Recent Labs Lab 12/02/13 1107  PHART 7.406  PCO2ART 48.1*  PO2ART 88.0   Liver Enzymes  Recent Labs Lab 12/04/13 0353  AST 15  ALT 19  ALKPHOS 53  BILITOT 0.9  ALBUMIN 3.2*   Cardiac Enzymes No results  found for this basename: TROPONINI, PROBNP,  in the last 168 hours  Glucose  Recent Labs Lab 12/06/13 0755 12/06/13 1151 12/06/13 1706 12/06/13 2024 12/06/13 2251 12/07/13 0131  GLUCAP 123* 266* 407* 354* 258* 211*   IMAGING:  Dg Chest Port 1 View  12/07/2013   CLINICAL DATA Hypoxia  EXAM PORTABLE CHEST - 1 VIEW  COMPARISON December 05, 2013  FINDINGS There is underlying emphysematous change. There is no edema or consolidation. The heart is upper normal in size. The pulmonary vascularity reflects underlying emphysema. No adenopathy. There is arthropathy in both shoulders.  IMPRESSION Underlying emphysematous change.  No edema or consolidation.  SIGNATURE  Electronically Signed   By: Lowella Grip M.D.   On: 12/07/2013 07:15    ASSESSMENT / PLAN:  A:  Acute on chronic respiratory failure (resolving) AECOPD Tobacco use disorder Syncope in setting of hypoxia  / respiratory failure (resolved) Hx of HTN, hyperlipidemia. Mild hyponatremia, hyperkalemia (both improved) Likely contaminant with Micrococcus DM II Deconditioning Encephalopathy, resolved  P:   Wean O2 as tolerated to keep SpO2 > 92%  Consult care management for home O2, (was never set up after previous discharge) Spiriva / Pulmicort / Brovana Prednisone 40, taper to 30mg  daily 3/12 Albuterol PRN Trend CXR, 2 view prior to d/c ASA, Norvasc, Lipitor Trend BMP Lantus 25 units, SSI Add 4 units insulin coverage TID with meals per DM coordinator.  Home health PT at discharge Likely Dc to home tomorrow.   Georgann Housekeeper, ACNP [facility] Pager [phone] or [phone]  Patient seen and examined, agree with above note.  I dictated the care and orders written for this patient under my direction.  Rush Farmer, MD [phone]

## 2013-12-07 NOTE — Care Management Note (Addendum)
    Page 1 of 2   12/08/2013     11:43:20 AM   CARE MANAGEMENT NOTE 12/08/2013  Patient:  DYLLON, HENKEN   Account Number:  [id number]  Date Initiated:  12/02/2013  Documentation initiated by:  Elissa Hefty  Subjective/Objective Assessment:   adm w inc resp, fever  lives with spouse  has rolling walker at home.     Action/Plan:   lives w wife, pcp dr Roque Lias tower   Anticipated DC Date:  12/08/2013   Anticipated DC Plan:  [place]  CM consult      PAC Choice  [place]   Choice offered to / List presented to:  C-1 Patient   DME arranged  [facility]      DME agency  [place] arranged  [place]      [place].   Status of service:  Completed, signed off Medicare Important Message given?   (If response is "NO", the following Medicare IM given date fields will be blank) Date Medicare IM given:   Date Additional Medicare IM given:    Discharge Disposition:  [place]  Per UR Regulation:  Reviewed for med. necessity/level of care/duration of stay  If discussed at [place] of Stay Meetings, dates discussed:   12/06/2013    Comments:  12/08/13 [place], BSN [phone] patient for dc today, patient states the oxygen has been set up at his house,  Wife is to pick him up , she should have oxygen tank with her, if not will need to get one from [facility] to take home with patient.  Patient has his oxygen tank from [facility] in the room.  12/07/13 [place], BSN [phone] patient lives with spouse, he states he would like to work with [facility] for hhpt and for oxygen.  Referral made to [place] notiifed.  Soc will begin 24-48 hrs post discharge. Justin notified about the home oxygen awaitng qualifying saturations from RN.  Patient did not want to ambulate at first for [place] to get oxygen saturations, finally at 1643 he decided to  ambulate for RN to get sats, she documented. NCM notified Larkin Ina with [employer] that sats were in the system, he will get information to home delivery and contact person is his spouse Faith [phone].  Patient 's wife stated they did not need HHRN for DM teaching  because patient knows about his diabetes. Per MD patient had some problems with breathing last pm so will plan for dc on 3/12.

## 2013-12-07 NOTE — Progress Notes (Signed)
SATURATION QUALIFICATIONS: (This note is used to comply with regulatory documentation for home oxygen)  Patient Saturations on Room Air at Rest = 86%  Patient Saturations on Room Air while Ambulating = 80%  Patient Saturations on 45 Liters of oxygen while Ambulating = 91%  Please briefly explain why patient needs home oxygen: Patient needs oxygen at rest and while ambulating - his saturation beeing under 90% without oxygen.

## 2013-12-08 LAB — GLUCOSE, CAPILLARY
Glucose-Capillary: 119 mg/dL — ABNORMAL HIGH (ref 70–99)
Glucose-Capillary: 239 mg/dL — ABNORMAL HIGH (ref 70–99)

## 2013-12-08 MED ORDER — MIDAZOLAM HCL 5 MG/ML IJ SOLN
INTRAMUSCULAR | Status: AC
  Filled 2013-12-08: qty 2

## 2013-12-08 MED ORDER — INSULIN ASPART 100 UNIT/ML ~~LOC~~ SOLN: 0.0000 [IU] | Freq: Three times a day (TID) | SUBCUTANEOUS | Status: DC

## 2013-12-08 MED ORDER — PREDNISONE 10 MG PO TABS: ORAL_TABLET | ORAL | Status: DC

## 2013-12-08 MED ORDER — INSULIN LISPRO 100 UNIT/ML (KWIKPEN): 0.0000 [IU] | PEN_INJECTOR | Freq: Three times a day (TID) | SUBCUTANEOUS | Status: DC

## 2013-12-08 MED ORDER — DIPHENHYDRAMINE HCL 50 MG/ML IJ SOLN
INTRAMUSCULAR | Status: AC
Start: 2013-12-08 — End: 2013-12-08
  Filled 2013-12-08: qty 1

## 2013-12-08 MED ORDER — FENTANYL CITRATE 0.05 MG/ML IJ SOLN
INTRAMUSCULAR | Status: AC
  Filled 2013-12-08: qty 2

## 2013-12-08 NOTE — Progress Notes (Signed)
PT Cancellation Note  Patient Details Name: Jonathan [name] MRN: [id number] DOB: 1941/08/18   Cancelled Treatment:    Reason Eval/Treat Not Completed: Other (comment) (pt refused PT, stated he was [place] today)   Philomena Doheny 12/08/2013, 2:20 PM [phone]

## 2013-12-08 NOTE — Discharge Summary (Signed)
Physician Discharge Summary  Patient ID: Jonathan [name] MRN: [id number] DOB/AGE: 1940-11-19 73 y.o.  Admit date: 11/28/2013 Discharge date: 12/08/2013  Problem List Principal Problem:   Acute respiratory failure Active Problems:   HYPERTENSION   COPD   CVA (cerebral infarction)   COPD (chronic obstructive pulmonary disease)   Syncope   Fever   Acute encephalopathy   Diabetes mellitus   Respiratory failure   Bacteremia  HPI: Jonathan [name] is a 73 y.o. male history of GOLD stage 2 COPD (Last seen by RA in 2012) who was discharged 2/28 was brought to the ER 3/2 after patient's family found patient unconscious in the bathroom. Patient since discharge has been feeling short of breath and last night patient's family heard a sound when they checked patient was found unconscious in the bathtub. He regained consciousness and when patient's family checked his blood sugar it was around 197. EMS was called and patient was brought to the ER.  Hospital Course: In the ER patient was found to be short of breath and was placed on BiPAP. In the ER patient was also found to be febrile with periods of confusion. Patient is complaining of pain in the forehead. CT head was done which did not show anything acute except for parotid gland enlargement. Was placed on NIMVS initially but son after required intubation. PCCM will assume care and place him in the ICU unit. He was admitted and received the typical treatment modalities for Acute respiratory failure, AECOPD, and HCAP, which include mechanical ventilation, IV antibiotics, and IV steroids. He was able to be extubated later that day, and on 3/3 he was comfortable with supplemental O2, PE was ruled out. Cultures resulted with Staph in urine and micrococcus in blood, so ABX were altered accordingly. He had been slow to progress throughout his admission, as DOE and fatigue limited his recovery. 3/6 he was taken back to the ICU for altered mental status.  He was  placed on BiPAP and his mental status improved. Acute neurologic processes were ruled out. 3/9 he had improved to the point he was transferred out of ICU. He continued to have mild SOB and hyperglycemic issues. 3/12 deemed a candidate for discharge.   STUDIES:  3/2 Head CT >>> nad  3/2 TTE >>> EF 60, no RWMA  3/2 EEG >>> nad  3/2 Chest CTA >>> Emphysema, no central PE  3/3 MRI brain >>> Etrophy, small vessel disease  3/7 EEG >>> Metabolic / toxic encephalopathy, no seizures  3/7 Renal US >>> No renal abscess, stable cyst within the inferior pole of the left kidney   CULTURES:  3/2 Blood >>> Micrococcus (1 of 2 bottles)  3/2 Urine >>> STAPHYLOCOCCUS SPECIES (COAGULASE NEGATIVE)  3/5 Blood >>>  ANTIBIOTICS:  Vancomycin 3/2 >>> 3/8  Cefepime 3/2 >>> 3/3  Levaquin 3/3 >>> 3/8  DISCHARGE A/P:  A:  Acute on chronic respiratory failure (resolved)  AECOPD (resolved) Tobacco use disorder  Syncope in setting of hypoxia / respiratory failure (resolved)  Hx of HTN, hyperlipidemia.  Mild hyponatremia, hyperkalemia (resolved)  DM II  Deconditioning  Encephalopathy, (resolved)  P:  Home O2 continuous at [facility] regimen Prednisone taper 30>20x2>10x2>off Home anti-HTN D/c with SSI while on steroids. Never used before Home Lantus dose  F/u with [facility] 3/19 4pm   Labs at discharge Lab Results  Component Value Date   CREATININE 0.77 12/07/2013   BUN 24* 12/07/2013   NA 140 12/07/2013  K 3.4* 12/07/2013   CL 97 12/07/2013   CO2 32 12/07/2013   Lab Results  Component Value Date   WBC 17.5* 12/07/2013   HGB 16.2 12/07/2013   HCT 46.7 12/07/2013   MCV 87.1 12/07/2013   PLT 180 12/07/2013   Lab Results  Component Value Date   ALT 19 12/04/2013   AST 15 12/04/2013   ALKPHOS 53 12/04/2013   BILITOT 0.9 12/04/2013   Lab Results  Component Value Date   INR 1.04 11/28/2013   INR 0.9 09/08/2007    Current radiology studies Dg Chest Port 1 View  12/07/2013    CLINICAL DATA Hypoxia  EXAM PORTABLE CHEST - 1 VIEW  COMPARISON December 05, 2013  FINDINGS There is underlying emphysematous change. There is no edema or consolidation. The heart is upper normal in size. The pulmonary vascularity reflects underlying emphysema. No adenopathy. There is arthropathy in both shoulders.  IMPRESSION Underlying emphysematous change.  No edema or consolidation.  SIGNATURE  Electronically Signed   By: Lowella Grip M.D.   On: 12/07/2013 07:15    Disposition:  01-Home or Self Care      Discharge Orders   Future Appointments Provider Department Dept Phone   12/15/2013 4:00 PM Melvenia Needles, NP [facility] [phone]   Future Orders Complete By Expires   Call MD for:  difficulty breathing, headache or visual disturbances  As directed    Call MD for:  extreme fatigue  As directed    Call MD for:  persistant dizziness or light-headedness  As directed    Call MD for:  temperature >100.4  As directed    Diet Carb Modified  As directed    Discharge instructions  As directed    Comments:     Sliding scale insulin as instructed by DM coordinator. Check blood sugar before each meal 3 times a day, and administer regular insulin as depicted in Sliding Scale.   Increase activity slowly  As directed        Medication List    STOP taking these medications       ibuprofen 200 MG tablet  Commonly known as:  ADVIL,MOTRIN     levofloxacin 500 MG tablet  Commonly known as:  LEVAQUIN     OVER THE COUNTER MEDICATION      TAKE these medications       albuterol 108 (90 BASE) MCG/ACT inhaler  Commonly known as:  VENTOLIN HFA  Inhale 2 puffs into the lungs every 6 (six) hours as needed for wheezing.     albuterol (2.5 MG/3ML) 0.083% nebulizer solution  Commonly known as:  PROVENTIL  Take 3 mLs (2.5 mg total) by nebulization 2 (two) times daily as needed for shortness of breath.     amLODipine 5 MG tablet  Commonly known as:  NORVASC  Take 5 mg by mouth  daily.     aspirin 81 MG tablet  Take 1 tablet (81 mg total) by mouth daily.     atorvastatin 10 MG tablet  Commonly known as:  LIPITOR  Take 10 mg by mouth daily.     chlorpheniramine-HYDROcodone 10-8 MG/5ML Lqcr  Commonly known as:  TUSSIONEX  Take 5 mLs by mouth every 12 (twelve) hours as needed for cough.     glipiZIDE 10 MG 24 hr tablet  Commonly known as:  GLIPIZIDE XL  Take 1 tablet (10 mg total) by mouth daily.     guaiFENesin 600 MG 12 hr tablet  Commonly  known as:  MUCINEX  Take 1 tablet (600 mg total) by mouth 2 (two) times daily.     insulin aspart 100 UNIT/ML injection  Commonly known as:  novoLOG  Inject 0-20 Units into the skin 4 (four) times daily -  with meals and at bedtime.     insulin glargine 100 units/mL Soln  Commonly known as:  LANTUS  Inject 40 Units into the skin daily at 8 pm.     metFORMIN 1000 MG tablet  Commonly known as:  GLUCOPHAGE  Take 500-1,000 mg by mouth See admin instructions. Take 1 tablet in the morning, 0.5 tablet midday and 1 tablet in the evening     nicotine 14 mg/24hr patch  Commonly known as:  EQL NICOTINE  Place 1 patch (14 mg total) onto the skin daily.     pantoprazole 40 MG tablet  Commonly known as:  PROTONIX  Take 1 tablet (40 mg total) by mouth daily at 12 noon.     predniSONE 10 MG tablet  Commonly known as:  DELTASONE  Take 3 tabs daily x 1 day, then 2 tabs daily x 2 days, then 1 tab daily x2 days then stop. #9     tiotropium 18 MCG inhalation capsule  Commonly known as:  SPIRIVA HANDIHALER  Place 1 capsule (18 mcg total) into inhaler and inhale daily.       Follow-up Information   Follow up with PARRETT,TAMMY, NP On 12/15/2013. (4:00PM - [facility])    Specialty:  Nurse Practitioner   Contact information:   [address] [address] [phone]        Discharged Condition: fair  Time spent on discharge greater than 40 minutes.  Vital signs at Discharge. Temp:  [97.8 F (36.6  C)-98.2 F (36.8 C)] 98.2 F (36.8 C) (03/12 0447) Pulse Rate:  [64-69] 64 (03/12 0447) Resp:  [16-20] 16 (03/12 0447) BP: (116-140)/(53-70) 116/53 mmHg (03/12 0447) SpO2:  [80 %-97 %] 92 % (03/12 0749) Office follow up Special Information or instructions. Signed:  Georgann Housekeeper, ACNP [facility] Pulmonology/Critical Care Pager [phone] or [phone]  Patient seen and examined, agree with above note.  I dictated the care and orders written for this patient under my direction.  Rush Farmer, MD [phone]

## 2013-12-08 NOTE — Progress Notes (Signed)
Inpatient Diabetes Program Recommendations  AACE/ADA: New Consensus Statement on Inpatient Glycemic Control (2013)  Target Ranges:  Prepandial:   less than 140 mg/dL      Peak postprandial:   less than 180 mg/dL (1-2 hours)      Critically ill patients:  140 - 180 mg/dL   Consult received regarding assisting pt with understanding of need for Novolog correction insulin in addition to the lantus daily dose. Pt at first thought he would be using a syringe and bottle but I explaiined that he can use the novolog pen as well (since he already uses a lantus insulin pen at home.). Pt expressed understanding and need for 3 checks a day with each meal.  BTW Pt states he has quit smoking for good!  Stated he did that 'a long time ago'.  When asked how long it had been since he quit, he answered, 'a little over a week'.  Gave him my card and information. Wanted to speak with wife as well, but he states she's somewhere eating and didn't need her in there. Requested RN, Marc Morgans to reinforce the directions and to assure he understands the correction novolog insulin method. Thank you, Rosita Kea, RN, CNS, Diabetes Coordinator [phone])

## 2013-12-08 NOTE — Discharge Instructions (Signed)
Has Home Oxygen with [facility] and physical therapy  [phone].  Sliding Scale Insulin is as follows, this will likely only be necessary for a few days while weaning off steroids.  Check blood sugar approximately 15 minutes before each meal Administer insulin via Humalog Pen based off of blood sugar result  Blood sugar     Units of Humalog      70-120    =    0 units     121-150   =    3 units     151-200   =    4 units     201-250   =    7 units     251-300   =   11 units     301-350   =   15 units      351-400  =   20 units

## 2013-12-08 NOTE — Progress Notes (Signed)
NURSING PROGRESS NOTE  Jonathan [name] [id number] Discharge Data: 12/08/2013 2:35 PM Attending Provider: Doree Fudge* TKP:TWSFK Tower, MD   Rise Paganini to be D/C'd Home per MD order.    All IV's will be discontinued and monitored for bleeding.  All belongings will be returned to patient for patient to take home.  Last Documented Vital Signs:  Blood pressure 116/53, pulse 64, temperature 98.2 F (36.8 C), temperature source Oral, resp. rate 16, height 5\' 8"  (1.727 m), weight 81.421 kg (179 lb 8 oz), SpO2 92.00%.  Joslyn Hy, MSN, RN, [employer]

## 2013-12-12 LAB — GLUCOSE, CAPILLARY: Glucose-Capillary: 121 mg/dL — ABNORMAL HIGH (ref 70–99)

## 2013-12-14 ENCOUNTER — Emergency Department (HOSPITAL_COMMUNITY): Payer: Medicare Other

## 2013-12-14 ENCOUNTER — Inpatient Hospital Stay (HOSPITAL_COMMUNITY)
Admission: EM | Admit: 2013-12-14 | Discharge: 2013-12-19 | DRG: 100 | Disposition: A | Discharge status: Skilled Nursing Facility | Payer: Medicare Other | Attending: Internal Medicine | Admitting: Internal Medicine

## 2013-12-14 ENCOUNTER — Inpatient Hospital Stay (HOSPITAL_COMMUNITY): Payer: Medicare Other

## 2013-12-14 ENCOUNTER — Inpatient Hospital Stay: Payer: Medicare Other | Admitting: Adult Health

## 2013-12-14 DIAGNOSIS — Z7982 Long term (current) use of aspirin: Secondary | ICD-10-CM

## 2013-12-14 DIAGNOSIS — J4489 Other specified chronic obstructive pulmonary disease: Secondary | ICD-10-CM | POA: Diagnosis present

## 2013-12-14 DIAGNOSIS — R569 Unspecified convulsions: Principal | ICD-10-CM | POA: Diagnosis present

## 2013-12-14 DIAGNOSIS — N529 Male erectile dysfunction, unspecified: Secondary | ICD-10-CM

## 2013-12-14 DIAGNOSIS — I639 Cerebral infarction, unspecified: Secondary | ICD-10-CM | POA: Diagnosis present

## 2013-12-14 DIAGNOSIS — E1165 Type 2 diabetes mellitus with hyperglycemia: Secondary | ICD-10-CM

## 2013-12-14 DIAGNOSIS — E785 Hyperlipidemia, unspecified: Secondary | ICD-10-CM | POA: Diagnosis present

## 2013-12-14 DIAGNOSIS — J9690 Respiratory failure, unspecified, unspecified whether with hypoxia or hypercapnia: Secondary | ICD-10-CM

## 2013-12-14 DIAGNOSIS — E1169 Type 2 diabetes mellitus with other specified complication: Secondary | ICD-10-CM | POA: Diagnosis present

## 2013-12-14 DIAGNOSIS — F528 Other sexual dysfunction not due to a substance or known physiological condition: Secondary | ICD-10-CM

## 2013-12-14 DIAGNOSIS — E119 Type 2 diabetes mellitus without complications: Secondary | ICD-10-CM

## 2013-12-14 DIAGNOSIS — R509 Fever, unspecified: Secondary | ICD-10-CM

## 2013-12-14 DIAGNOSIS — S46009A Unspecified injury of muscle(s) and tendon(s) of the rotator cuff of unspecified shoulder, initial encounter: Secondary | ICD-10-CM

## 2013-12-14 DIAGNOSIS — L259 Unspecified contact dermatitis, unspecified cause: Secondary | ICD-10-CM | POA: Diagnosis present

## 2013-12-14 DIAGNOSIS — J9600 Acute respiratory failure, unspecified whether with hypoxia or hypercapnia: Secondary | ICD-10-CM

## 2013-12-14 DIAGNOSIS — I6350 Cerebral infarction due to unspecified occlusion or stenosis of unspecified cerebral artery: Secondary | ICD-10-CM

## 2013-12-14 DIAGNOSIS — R7881 Bacteremia: Secondary | ICD-10-CM

## 2013-12-14 DIAGNOSIS — Z9981 Dependence on supplemental oxygen: Secondary | ICD-10-CM

## 2013-12-14 DIAGNOSIS — R911 Solitary pulmonary nodule: Secondary | ICD-10-CM

## 2013-12-14 DIAGNOSIS — J440 Chronic obstructive pulmonary disease with acute lower respiratory infection: Secondary | ICD-10-CM

## 2013-12-14 DIAGNOSIS — G9340 Encephalopathy, unspecified: Secondary | ICD-10-CM

## 2013-12-14 DIAGNOSIS — Z881 Allergy status to other antibiotic agents status: Secondary | ICD-10-CM

## 2013-12-14 DIAGNOSIS — Z8673 Personal history of transient ischemic attack (TIA), and cerebral infarction without residual deficits: Secondary | ICD-10-CM

## 2013-12-14 DIAGNOSIS — Z888 Allergy status to other drugs, medicaments and biological substances status: Secondary | ICD-10-CM

## 2013-12-14 DIAGNOSIS — R55 Syncope and collapse: Secondary | ICD-10-CM

## 2013-12-14 DIAGNOSIS — N209 Urinary calculus, unspecified: Secondary | ICD-10-CM

## 2013-12-14 DIAGNOSIS — F17200 Nicotine dependence, unspecified, uncomplicated: Secondary | ICD-10-CM | POA: Diagnosis present

## 2013-12-14 DIAGNOSIS — R404 Transient alteration of awareness: Secondary | ICD-10-CM

## 2013-12-14 DIAGNOSIS — J9610 Chronic respiratory failure, unspecified whether with hypoxia or hypercapnia: Secondary | ICD-10-CM | POA: Diagnosis present

## 2013-12-14 DIAGNOSIS — M25519 Pain in unspecified shoulder: Secondary | ICD-10-CM

## 2013-12-14 DIAGNOSIS — I10 Essential (primary) hypertension: Secondary | ICD-10-CM

## 2013-12-14 DIAGNOSIS — L219 Seborrheic dermatitis, unspecified: Secondary | ICD-10-CM

## 2013-12-14 DIAGNOSIS — N200 Calculus of kidney: Secondary | ICD-10-CM

## 2013-12-14 DIAGNOSIS — E875 Hyperkalemia: Secondary | ICD-10-CM

## 2013-12-14 DIAGNOSIS — Z825 Family history of asthma and other chronic lower respiratory diseases: Secondary | ICD-10-CM

## 2013-12-14 DIAGNOSIS — J449 Chronic obstructive pulmonary disease, unspecified: Secondary | ICD-10-CM

## 2013-12-14 DIAGNOSIS — M7500 Adhesive capsulitis of unspecified shoulder: Secondary | ICD-10-CM

## 2013-12-14 DIAGNOSIS — R4701 Aphasia: Secondary | ICD-10-CM | POA: Insufficient documentation

## 2013-12-14 DIAGNOSIS — R410 Disorientation, unspecified: Secondary | ICD-10-CM

## 2013-12-14 DIAGNOSIS — Z79899 Other long term (current) drug therapy: Secondary | ICD-10-CM

## 2013-12-14 DIAGNOSIS — J441 Chronic obstructive pulmonary disease with (acute) exacerbation: Secondary | ICD-10-CM

## 2013-12-14 DIAGNOSIS — E873 Alkalosis: Secondary | ICD-10-CM | POA: Diagnosis present

## 2013-12-14 DIAGNOSIS — Z720 Tobacco use: Secondary | ICD-10-CM

## 2013-12-14 DIAGNOSIS — E871 Hypo-osmolality and hyponatremia: Secondary | ICD-10-CM | POA: Diagnosis present

## 2013-12-14 DIAGNOSIS — Z794 Long term (current) use of insulin: Secondary | ICD-10-CM

## 2013-12-14 DIAGNOSIS — R1310 Dysphagia, unspecified: Secondary | ICD-10-CM | POA: Diagnosis present

## 2013-12-14 DIAGNOSIS — IMO0002 Reserved for concepts with insufficient information to code with codable children: Secondary | ICD-10-CM

## 2013-12-14 DIAGNOSIS — F4321 Adjustment disorder with depressed mood: Secondary | ICD-10-CM

## 2013-12-14 DIAGNOSIS — F05 Delirium due to known physiological condition: Secondary | ICD-10-CM | POA: Diagnosis present

## 2013-12-14 DIAGNOSIS — IMO0001 Reserved for inherently not codable concepts without codable children: Secondary | ICD-10-CM | POA: Diagnosis present

## 2013-12-14 DIAGNOSIS — J690 Pneumonitis due to inhalation of food and vomit: Secondary | ICD-10-CM | POA: Diagnosis present

## 2013-12-14 DIAGNOSIS — Z2821 Immunization not carried out because of patient refusal: Secondary | ICD-10-CM

## 2013-12-14 LAB — I-STAT ARTERIAL BLOOD GAS, ED
Acid-Base Excess: 6 mmol/L — ABNORMAL HIGH (ref 0.0–2.0)
Acid-Base Excess: 1 mmol/L (ref 0.0–2.0)
Bicarbonate: 28.5 mEq/L — ABNORMAL HIGH (ref 20.0–24.0)
Bicarbonate: 24.3 mEq/L — ABNORMAL HIGH (ref 20.0–24.0)
O2 Saturation: 99 %
O2 Saturation: 97 %
Patient temperature: 97.6
Patient temperature: 98.6
TCO2: 30 mmol/L (ref 0–100)
TCO2: 25 mmol/L (ref 0–100)
pCO2 arterial: 34.3 mmHg — ABNORMAL LOW (ref 35.0–45.0)
pCO2 arterial: 35.1 mmHg (ref 35.0–45.0)
pH, Arterial: 7.526 — ABNORMAL HIGH (ref 7.350–7.450)
pH, Arterial: 7.449 (ref 7.350–7.450)
pO2, Arterial: 124 mmHg — ABNORMAL HIGH (ref 80.0–100.0)
pO2, Arterial: 89 mmHg (ref 80.0–100.0)

## 2013-12-14 LAB — CBC WITH DIFFERENTIAL/PLATELET
Basophils Absolute: 0 10*3/uL (ref 0.0–0.1)
Basophils Relative: 0 % (ref 0–1)
Eosinophils Absolute: 0.1 10*3/uL (ref 0.0–0.7)
Eosinophils Relative: 1 % (ref 0–5)
HCT: 45.7 % (ref 39.0–52.0)
Hemoglobin: 15.9 g/dL (ref 13.0–17.0)
Lymphocytes Relative: 22 % (ref 12–46)
Lymphs Abs: 3.3 10*3/uL (ref 0.7–4.0)
MCH: 30.1 pg (ref 26.0–34.0)
MCHC: 34.8 g/dL (ref 30.0–36.0)
MCV: 86.6 fL (ref 78.0–100.0)
Monocytes Absolute: 0.9 10*3/uL (ref 0.1–1.0)
Monocytes Relative: 6 % (ref 3–12)
Neutro Abs: 11 10*3/uL — ABNORMAL HIGH (ref 1.7–7.7)
Neutrophils Relative %: 72 % (ref 43–77)
Platelets: 148 10*3/uL — ABNORMAL LOW (ref 150–400)
RBC: 5.28 MIL/uL (ref 4.22–5.81)
RDW: 13.8 % (ref 11.5–15.5)
WBC: 15.4 10*3/uL — ABNORMAL HIGH (ref 4.0–10.5)

## 2013-12-14 LAB — RAPID URINE DRUG SCREEN, HOSP PERFORMED
Amphetamines: NOT DETECTED
Barbiturates: NOT DETECTED
Benzodiazepines: NOT DETECTED
Cocaine: NOT DETECTED
Opiates: NOT DETECTED
Tetrahydrocannabinol: NOT DETECTED

## 2013-12-14 LAB — COMPREHENSIVE METABOLIC PANEL
ALT: 45 U/L (ref 0–53)
AST: 20 U/L (ref 0–37)
Albumin: 3.2 g/dL — ABNORMAL LOW (ref 3.5–5.2)
Alkaline Phosphatase: 62 U/L (ref 39–117)
BUN: 20 mg/dL (ref 6–23)
CO2: 27 mEq/L (ref 19–32)
Calcium: 9.1 mg/dL (ref 8.4–10.5)
Chloride: 95 mEq/L — ABNORMAL LOW (ref 96–112)
Creatinine, Ser: 0.72 mg/dL (ref 0.50–1.35)
GFR calc Af Amer: >90 mL/min (ref >90)
GFR calc non Af Amer: >90 mL/min (ref >90)
Glucose, Bld: 86 mg/dL (ref 70–99)
Potassium: 4.1 mEq/L (ref 3.7–5.3)
Sodium: 136 mEq/L — ABNORMAL LOW (ref 137–147)
Total Bilirubin: 1.3 mg/dL — ABNORMAL HIGH (ref 0.3–1.2)
Total Protein: 5.9 g/dL — ABNORMAL LOW (ref 6.0–8.3)

## 2013-12-14 LAB — URINALYSIS, ROUTINE W REFLEX MICROSCOPIC
Bilirubin Urine: NEGATIVE
Glucose, UA: NEGATIVE mg/dL
Hgb urine dipstick: NEGATIVE
Ketones, ur: NEGATIVE mg/dL
Leukocytes, UA: NEGATIVE
Nitrite: NEGATIVE
Protein, ur: NEGATIVE mg/dL
Specific Gravity, Urine: 1.024 (ref 1.005–1.030)
Urobilinogen, UA: 1 mg/dL (ref 0.0–1.0)
pH: 6.5 (ref 5.0–8.0)

## 2013-12-14 LAB — LACTIC ACID, PLASMA: Lactic Acid, Venous: 2.3 mmol/L — ABNORMAL HIGH (ref 0.5–2.2)

## 2013-12-14 LAB — ETHANOL: Alcohol, Ethyl (B): <11 mg/dL (ref 0–11)

## 2013-12-14 LAB — AMMONIA: Ammonia: 17 umol/L (ref 11–60)

## 2013-12-14 MED ORDER — SODIUM CHLORIDE 0.9 % IV SOLN
INTRAVENOUS | Status: DC
  Administered 2013-12-17: 20 mL/h via INTRAVENOUS

## 2013-12-14 MED ORDER — SODIUM CHLORIDE 0.9 % IV SOLN: 250.0000 mL | INTRAVENOUS | Status: DC | PRN

## 2013-12-14 MED ORDER — OXYCODONE HCL 5 MG PO TABS
5.0000 mg | ORAL_TABLET | ORAL | Status: DC | PRN
  Administered 2013-12-15: 5 mg via ORAL
  Filled 2013-12-14: qty 1

## 2013-12-14 MED ORDER — ACETAMINOPHEN 650 MG RE SUPP: 650.0000 mg | Freq: Four times a day (QID) | RECTAL | Status: DC | PRN

## 2013-12-14 MED ORDER — SENNOSIDES-DOCUSATE SODIUM 8.6-50 MG PO TABS: 1.0000 | ORAL_TABLET | Freq: Every evening | ORAL | Status: DC | PRN

## 2013-12-14 MED ORDER — ONDANSETRON HCL 4 MG/2ML IJ SOLN: 4.0000 mg | Freq: Four times a day (QID) | INTRAMUSCULAR | Status: DC | PRN

## 2013-12-14 MED ORDER — ONDANSETRON HCL 4 MG PO TABS: 4.0000 mg | ORAL_TABLET | Freq: Four times a day (QID) | ORAL | Status: DC | PRN

## 2013-12-14 MED ORDER — ALUM & MAG HYDROXIDE-SIMETH 200-200-20 MG/5ML PO SUSP: 30.0000 mL | Freq: Four times a day (QID) | ORAL | Status: DC | PRN

## 2013-12-14 MED ORDER — SODIUM CHLORIDE 0.9 % IV SOLN: INTRAVENOUS | Status: DC

## 2013-12-14 MED ORDER — SODIUM CHLORIDE 0.9 % IV SOLN
1000.0000 mL | INTRAVENOUS | Status: DC
  Administered 2013-12-14 – 2013-12-16 (×2): 1000 mL via INTRAVENOUS

## 2013-12-14 MED ORDER — ENOXAPARIN SODIUM 40 MG/0.4ML ~~LOC~~ SOLN
40.0000 mg | SUBCUTANEOUS | Status: DC
  Administered 2013-12-14 – 2013-12-18 (×5): 40 mg via SUBCUTANEOUS
  Filled 2013-12-14 (×6): qty 0.4

## 2013-12-14 MED ORDER — INSULIN ASPART 100 UNIT/ML ~~LOC~~ SOLN
0.0000 [IU] | SUBCUTANEOUS | Status: DC
  Administered 2013-12-15: 1 [IU] via SUBCUTANEOUS
  Administered 2013-12-15 – 2013-12-16 (×2): 2 [IU] via SUBCUTANEOUS
  Administered 2013-12-16 (×2): 3 [IU] via SUBCUTANEOUS

## 2013-12-14 MED ORDER — SODIUM CHLORIDE 0.9 % IJ SOLN: 3.0000 mL | INTRAMUSCULAR | Status: DC | PRN

## 2013-12-14 MED ORDER — SODIUM CHLORIDE 0.9 % IJ SOLN
3.0000 mL | Freq: Two times a day (BID) | INTRAMUSCULAR | Status: DC
  Administered 2013-12-17: 3 mL via INTRAVENOUS

## 2013-12-14 MED ORDER — HYDROMORPHONE HCL PF 1 MG/ML IJ SOLN: 0.5000 mg | INTRAMUSCULAR | Status: DC | PRN

## 2013-12-14 MED ORDER — SODIUM CHLORIDE 0.9 % IV BOLUS (SEPSIS): 500.0000 mL | Freq: Once | INTRAVENOUS | Status: DC

## 2013-12-14 MED ORDER — SODIUM CHLORIDE 0.9 % IV SOLN
INTRAVENOUS | Status: AC
  Administered 2013-12-14: via INTRAVENOUS

## 2013-12-14 MED ORDER — ACETAMINOPHEN 325 MG PO TABS: 650.0000 mg | ORAL_TABLET | Freq: Four times a day (QID) | ORAL | Status: DC | PRN

## 2013-12-14 NOTE — ED Notes (Signed)
Pt moves all extremities but can not follow commands. The only clear word Pt states is "SHIT".

## 2013-12-14 NOTE — ED Notes (Signed)
Patient transported to X-ray 

## 2013-12-14 NOTE — Consult Note (Signed)
Referring Physician: Dr. Eulis Foster    Chief Complaint: Altered mental status with confusion and agitation.  HPI: Jonathan [name] is an 73 y.o. male with a history of diabetes mellitus, hypertension, hyperlipidemia, COPD and previous stroke presenting with new onset altered mental status with nonsensical speech output as well as agitation. Onset of symptoms was at 12:30 PM today. Patient has been taking aspirin 81 mg per day. He was admitted for altered mental status as well as possible acute infectious process. CT scan of his head reportedly showed no acute intracranial abnormality. NIH stroke score was 8. Patient was recently admitted to the hospital for respiratory infection and respiratory failure as well as UTI and bacteremia.  LSN: 12:30 PM on 12/14/2013 tPA Given: No: Beyond time window for treatment consideration MRankin: 3  Past Medical History  Diagnosis Date  . HTN (hypertension)   . HLD (hyperlipidemia)   . Pulmonary nodule, right     lower lobe  . COPD (chronic obstructive pulmonary disease)   . Rotator cuff injury     right  . Frozen shoulder   . Shoulder pain   . Hyperkalemia   . Dermatitis seborrheica   . Other diseases of lung, not elsewhere classified   . Kidney stone   . Allergic rhinitis   . ED (erectile dysfunction)   . Diabetes mellitus type II   . Asthma   . Tobacco abuse   . Stroke     pt states "last year"    Family History  Problem Relation Age of Onset  . Asthma Sister   . Emphysema Sister      Medications: I have reviewed the patient's current medications.  ROS: History obtained from friends and chart review  General ROS: negative for - chills, fever, night sweats, weight gain or weight loss Psychological ROS: See history of present illness  Ophthalmic ROS: negative for - blurry vision, double vision, eye pain or loss of vision ENT ROS: negative for - epistaxis, nasal discharge, oral lesions, sore throat, tinnitus or vertigo Allergy and  Immunology ROS: negative for - hives or itchy/watery eyes Hematological and Lymphatic ROS: negative for - bleeding problems, bruising or swollen lymph nodes Endocrine ROS: negative for - galactorrhea, hair pattern changes, polydipsia/polyuria or temperature intolerance Respiratory ROS: Recent hospitalization for respiratory infection and acute respiratory failure Cardiovascular ROS: negative for - chest pain, dyspnea on exertion, edema or irregular heartbeat Gastrointestinal ROS: negative for - abdominal pain, diarrhea, hematemesis, nausea/vomiting or stool incontinence Genito-Urinary ROS: Recent UTI Musculoskeletal ROS: negative for - joint swelling or muscular weakness Neurological ROS: as noted in HPI Dermatological ROS: negative for rash and skin lesion changes  Physical Examination: Blood pressure 132/58, pulse 89, temperature 97.9 F (36.6 C), temperature source Rectal, resp. rate 29, height 5\' 8"  (1.727 m), weight 86.183 kg (190 lb), SpO2 97.00%.  Neurologic Examination: Mental Status: Slightly lethargic and moderately agitated. Patient had marked expressive aphasia as well as receptive component with "word salad" speech output, for the most part. He did not follow any simple commands. Cranial Nerves: II-blink to visual threat bilaterally. III/IV/VI-Pupils were equal and reacted. Extraocular movements were full and conjugate.    VII- no facial weakness. VIII-normal. X-mild dysarthria with unintelligible speech for the most part. Motor: Patient moved extremities equally with symmetrical strength except for reduced range of motion of right shoulder (known arthrosis). Sensory: Grossly normal throughout. Deep Tendon Reflexes: Absent in lower extremities. Plantars: Mute bilaterally Cerebellar: Unable to evaluate.  Dg Chest 2 View  12/14/2013   CLINICAL DATA:  ALTERED MENTAL STATUS  EXAM: CHEST  2 VIEW  COMPARISON:  DG CHEST 1V PORT dated 12/07/2013  FINDINGS: Low lung volumes. There  is increased density projects in the lung bases right greater than left. Cardiac silhouettes within normal limits. Calcified lymph nodes project within the infrahilar region on the left. The osseous structures demonstrate no acute abnormalities.  IMPRESSION: Low lung volumes which accentuates lung findings. There are areas of increased density within the lung bases differential considerations are atelectasis versus infiltrates. Right greater than left.   Electronically Signed   By: Margaree Mackintosh M.D.   On: 12/14/2013 16:07   Ct Head Wo Contrast  12/14/2013   CLINICAL DATA:  Altered mental status  EXAM: CT HEAD WITHOUT CONTRAST  TECHNIQUE: Contiguous axial images were obtained from the base of the skull through the vertex without intravenous contrast. Study was obtained within 24 hr of patient's arrival at the emergency department.  COMPARISON:  Brain CT November 28, 2013 and brain MRI November 29, 2013.  FINDINGS: Mild generalized atrophy remains stable. There is no demonstrable mass, hemorrhage, extra-axial fluid collection, or midline shift. Patchy small vessel disease in the centra semiovale bilaterally is stable. No new gray-white compartment lesions are identified. There is no acute appearing infarct on this study.  The bony calvarium appears intact. Mastoids on the left are clear. Mastoids on the right are partially opacified, stable. There is patchy ethmoid sinus disease bilaterally. Atrophy of the left parotid gland with fatty replacement remains. The visualized right parotid gland appears somewhat hypertrophied but otherwise unremarkable in appearance, unchanged from recent prior study.  IMPRESSION: Atrophy with small vessel disease. No intracranial mass, hemorrhage, or acute appearing infarct.  Atrophy of the left parotid gland with relative hypertrophy of the right parotid gland appears stable. There is right mastoid as well as bilateral ethmoid sinus disease.   Electronically Signed   By: Lowella Grip  M.D.   On: 12/14/2013 16:07    Assessment: 73 y.o. male probable acute left MCA territory subcortical ischemic infarction with acute receptive and expressive aphasia. Cannot rule out recurrent infectious process contributing to patient's altered mental status, as well.  Stroke Risk Factors - diabetes mellitus, hyperlipidemia and hypertension  Plan: 1. HgbA1c, fasting lipid panel 2. MRI, MRA  of the brain without contrast 3. PT consult, OT consult, Speech consult 4. Echocardiogram 5. Carotid dopplers 6. Prophylactic therapy-Antiplatelet med: Aspirin - 325 mg per day orally, or 300 mg per day rectally 7. Risk factor modification 8. Telemetry monitoring  C.R. Nicole Kindred, MD [facility] [phone]  12/14/2013, 8:23 PM

## 2013-12-14 NOTE — ED Notes (Signed)
PT moves all ext. But is unable to follow commands.

## 2013-12-14 NOTE — ED Notes (Signed)
RT notified of order for blood gas.

## 2013-12-14 NOTE — ED Notes (Signed)
Report given to [place], RN on [phone]. Initial report was given to the incorrect floor.

## 2013-12-14 NOTE — ED Notes (Signed)
Pt is alert and awake . Pt moves all extremities with out difficulty.

## 2013-12-14 NOTE — ED Notes (Signed)
Family called 911 because PT woke at 0900 and had a change in mental status . Per family Pt had confusion and was agitated. On arrival to ED Pt A/O only to name and birthdate.

## 2013-12-14 NOTE — ED Notes (Signed)
Patient transported to CT 

## 2013-12-14 NOTE — ED Provider Notes (Signed)
CSN: [id number]     Arrival date & time 12/14/13  1405 History   First MD Initiated Contact with Patient 12/14/13 1411     Chief Complaint  Patient presents with  . Altered Mental Status     (Consider location/radiation/quality/duration/timing/severity/associated sxs/prior Treatment) Patient is a 73 y.o. male presenting with altered mental status. The history is provided by the patient and a relative.  Altered Mental Status  Brought in by EMS, for evaluation of confusion. Symptoms started ts morning. The symptoms wax and wane. Earlier today, he was complaining of back pain when he ate his breakfast. Later today, hid not recognize his girlfriend. The patient cannot contribute any history. Family reports he was recently treated in hospital for a respiratory infection, blood infection, and UTI.  Past Medical History  Diagnosis Date  . HTN (hypertension)   . HLD (hyperlipidemia)   . Pulmonary nodule, right     lower lobe  . COPD (chronic obstructive pulmonary disease)   . Rotator cuff injury     right  . Frozen shoulder   . Shoulder pain   . Hyperkalemia   . Dermatitis seborrheica   . Other diseases of lung, not elsewhere classified   . Kidney stone   . Allergic rhinitis   . ED (erectile dysfunction)   . Diabetes mellitus type II   . Asthma   . Tobacco abuse   . Stroke     pt states "last year"   Past Surgical History  Procedure Laterality Date  . Cervical discectomy  1998  . Ett  11/1994  . Tm repair    . Tee without cardioversion  12/03/2011    Procedure: TRANSESOPHAGEAL ECHOCARDIOGRAM (TEE);  Surgeon: Lelon Perla, MD;  Location: [facility] ENDOSCOPY;  Service: Cardiovascular;  Laterality: N/A;   Family History  Problem Relation Age of Onset  . Asthma Sister   . Emphysema Sister    History  Substance Use Topics  . Smoking status: Current Every Day Smoker -- 0.20 packs/day    Types: Cigarettes  . Smokeless tobacco: Never Used     Comment: prev smoked 1 1/2 PPD.  Currently 5 cigs daily  . Alcohol Use: No     Comment: Quit 40 years ago    Review of Systems  Unable to perform ROS     Allergies  Amoxicillin-pot clavulanate; Quinapril hcl; Valsartan; and Varenicline tartrate  Home Medications   Current Outpatient Rx  Name  Route  Sig  Dispense  Refill  . albuterol (PROVENTIL) (2.5 MG/3ML) 0.083% nebulizer solution   Nebulization   Take 3 mLs (2.5 mg total) by nebulization 2 (two) times daily as needed for shortness of breath.   75 mL   0   . albuterol (VENTOLIN HFA) 108 (90 BASE) MCG/ACT inhaler   Inhalation   Inhale 2 puffs into the lungs every 6 (six) hours as needed for wheezing.   1 Inhaler   11   . amLODipine (NORVASC) 5 MG tablet   Oral   Take 5 mg by mouth daily.         [name] aspirin 81 MG tablet   Oral   Take 1 tablet (81 mg total) by mouth daily.   30 tablet   0   . atorvastatin (LIPITOR) 10 MG tablet   Oral   Take 10 mg by mouth daily.         . chlorpheniramine-HYDROcodone (TUSSIONEX) 10-8 MG/5ML LQCR   Oral   Take 5 mLs by mouth every  12 (twelve) hours as needed for cough.   115 mL   0   . glipiZIDE (GLIPIZIDE XL) 10 MG 24 hr tablet   Oral   Take 1 tablet (10 mg total) by mouth daily.   90 tablet   0     *patient needs appointment*   . guaiFENesin (MUCINEX) 600 MG 12 hr tablet   Oral   Take 1 tablet (600 mg total) by mouth 2 (two) times daily.   15 tablet   0   . insulin glargine (LANTUS) 100 units/mL SOLN   Subcutaneous   Inject 40 Units into the skin daily at 8 pm.          . insulin lispro (HUMALOG) 100 UNIT/ML KiwkPen   Subcutaneous   Inject 0-20 Units into the skin 3 (three) times daily with meals.   15 mL   11   . metFORMIN (GLUCOPHAGE) 1000 MG tablet   Oral   Take 500-1,000 mg by mouth See admin instructions. Take 1 tablet in the morning, 0.5 tablet midday and 1 tablet in the evening         . nicotine (EQL NICOTINE) 14 mg/24hr patch   Transdermal   Place 1 patch (14 mg total)  onto the skin daily.   28 patch   0   . pantoprazole (PROTONIX) 40 MG tablet   Oral   Take 1 tablet (40 mg total) by mouth daily at 12 noon.   30 tablet   0   . predniSONE (DELTASONE) 10 MG tablet      Take 3 tabs daily x 1 day, then 2 tabs daily x 2 days, then 1 tab daily x2 days then stop. #9   9 tablet   0   . tiotropium (SPIRIVA HANDIHALER) 18 MCG inhalation capsule   Inhalation   Place 1 capsule (18 mcg total) into inhaler and inhale daily.   30 capsule   0    BP 114/50  Pulse 71  Temp(Src) 97.4 F (36.3 C) (Oral)  Resp 32  Ht 5\' 8"  (1.727 m)  Wt 190 lb (86.183 kg)  BMI 28.90 kg/m2  SpO2 98% Physical Exam  Nursing note and vitals reviewed. Constitutional: He appears well-developed.  Disheveled, ill appearing  HENT:  Head: Normocephalic and atraumatic.  Right Ear: External ear normal.  Left Ear: External ear normal.  Eyes: Conjunctivae and EOM are normal. Pupils are equal, round, and reactive to light. Right eye exhibits no discharge. Left eye exhibits no discharge. No scleral icterus.  Neck: Normal range of motion and phonation normal. Neck supple.  Cardiovascular: Normal rate, regular rhythm, normal heart sounds and intact distal pulses.   Pulmonary/Chest: Effort normal. No respiratory distress. He has wheezes. He has no rales. He exhibits no tenderness and no bony tenderness.  Abdominal: Soft. Normal appearance. There is no tenderness. There is no rebound.  Musculoskeletal: Normal range of motion. He exhibits no edema and no tenderness.  Neurological: He is alert. No cranial nerve deficit or sensory deficit. He exhibits normal muscle tone. Coordination normal.  No dysarthria, aphasia or nystagmus. Symmetric, but diminished lower extremity strength bilaterally. Normal strength arms.  Skin: Skin is warm, dry and intact.  Scattered ecchymosis. Lower abdomen, consistent with recent injections of low molecular weight heparin. Using bilateral forearms, consistent  with recent IV access sites. No petechial rashes  Psychiatric:  Unusual behavior; waxing and waning communication, prevents complete evaluation    ED Course  Procedures (including critical care time) Medications  0.9 %  sodium chloride infusion (1,000 mLs Intravenous New Bag/Given 12/14/13 1613)    Patient Vitals for the past 24 hrs:  BP Temp Temp src Pulse Resp SpO2 Height Weight  12/14/13 1436 - 97.4 F (36.3 C) Oral - - - - -  12/14/13 1430 114/50 mmHg - - 71 32 98 % - -  12/14/13 1421 114/62 mmHg - - 71 25 100 % 5\' 8"  (1.727 m) 190 lb (86.183 kg)    4:15 PM Reevaluation with update and discussion. After initial assessment and treatment, an updated evaluation reveals his exam is unchanged, still confused. Cath urine ordered. Family updated.Daleen Bo L   CRITICAL CARE Performed by: Richarda Blade Total critical care time: 50 minutes Critical care time was exclusive of separately billable procedures and treating other patients. Critical care was necessary to treat or prevent imminent or life-threatening deterioration. Critical care was time spent personally by me on the following activities: development of treatment plan with patient and/or surrogate as well as nursing, discussions with consultants, evaluation of patient's response to treatment, examination of patient, obtaining history from patient or surrogate, ordering and performing treatments and interventions, ordering and review of laboratory studies, ordering and review of radiographic studies, pulse oximetry and re-evaluation of patient's condition.   Labs Review Labs Reviewed  CBC WITH DIFFERENTIAL - Abnormal; Notable for the following:    WBC 15.4 (*)    Platelets 148 (*)    Neutro Abs 11.0 (*)    All other components within normal limits  COMPREHENSIVE METABOLIC PANEL - Abnormal; Notable for the following:    Sodium 136 (*)    Chloride 95 (*)    Total Protein 5.9 (*)    Albumin 3.2 (*)    Total Bilirubin  1.3 (*)    All other components within normal limits  LACTIC ACID, PLASMA - Abnormal; Notable for the following:    Lactic Acid, Venous 2.3 (*)    All other components within normal limits  I-STAT ARTERIAL BLOOD GAS, ED - Abnormal; Notable for the following:    pH, Arterial 7.526 (*)    pCO2 arterial 34.3 (*)    pO2, Arterial 124.0 (*)    Bicarbonate 28.5 (*)    Acid-Base Excess 6.0 (*)    All other components within normal limits  URINE CULTURE  CULTURE, BLOOD (ROUTINE X 2)  CULTURE, BLOOD (ROUTINE X 2)  ETHANOL  AMMONIA  URINE RAPID DRUG SCREEN (HOSP PERFORMED)  URINALYSIS, ROUTINE W REFLEX MICROSCOPIC   Imaging Review Dg Chest 2 View  12/14/2013   CLINICAL DATA:  ALTERED MENTAL STATUS  EXAM: CHEST  2 VIEW  COMPARISON:  DG CHEST 1V PORT dated 12/07/2013  FINDINGS: Low lung volumes. There is increased density projects in the lung bases right greater than left. Cardiac silhouettes within normal limits. Calcified lymph nodes project within the infrahilar region on the left. The osseous structures demonstrate no acute abnormalities.  IMPRESSION: Low lung volumes which accentuates lung findings. There are areas of increased density within the lung bases differential considerations are atelectasis versus infiltrates. Right greater than left.   Electronically Signed   By: Margaree Mackintosh M.D.   On: 12/14/2013 16:07   Ct Head Wo Contrast  12/14/2013   CLINICAL DATA:  Altered mental status  EXAM: CT HEAD WITHOUT CONTRAST  TECHNIQUE: Contiguous axial images were obtained from the base of the skull through the vertex without intravenous contrast. Study was obtained within 24 hr of patient's arrival at the emergency department.  COMPARISON:  Brain CT November 28, 2013 and brain MRI November 29, 2013.  FINDINGS: Mild generalized atrophy remains stable. There is no demonstrable mass, hemorrhage, extra-axial fluid collection, or midline shift. Patchy small vessel disease in the centra semiovale bilaterally is  stable. No new gray-white compartment lesions are identified. There is no acute appearing infarct on this study.  The bony calvarium appears intact. Mastoids on the left are clear. Mastoids on the right are partially opacified, stable. There is patchy ethmoid sinus disease bilaterally. Atrophy of the left parotid gland with fatty replacement remains. The visualized right parotid gland appears somewhat hypertrophied but otherwise unremarkable in appearance, unchanged from recent prior study.  IMPRESSION: Atrophy with small vessel disease. No intracranial mass, hemorrhage, or acute appearing infarct.  Atrophy of the left parotid gland with relative hypertrophy of the right parotid gland appears stable. There is right mastoid as well as bilateral ethmoid sinus disease.   Electronically Signed   By: Lowella Grip M.D.   On: 12/14/2013 16:07     EKG Interpretation   Date/Time:  Wednesday December 14 2013 14:36:33 EDT Ventricular Rate:  71 PR Interval:  129 QRS Duration: 82 QT Interval:  375 QTC Calculation: 407 R Axis:   52 Text Interpretation:  Sinus or ectopic atrial rhythm Sinus pause  Borderline low voltage, extremity leads Since last tracing rate slower  Confirmed by Merlene Dante  MD, Quisha Mabie [id number]) on 12/14/2013 3:02:31 PM      MDM   Final diagnoses:  Delirium    Nonspecific confusion, apparently recurrent, most likely related to factors. There is no immediate evidence for pneumonia, hypercapnia, serious bacterial infection or metabolic instability. He'll need to be admitted for further monitoring and treatment.  Nursing Notes Reviewed/ Care Coordinated, and agree without changes. Applicable Imaging Reviewed.  Interpretation of Laboratory Data incorporated into ED treatment  Plan: Admit  19:48- Dr. Arnoldo Morale has seen the patient and asked that I consult neurology to evaluate him for their input as to the source of his disability. Dr. Nicole Kindred responded, he will see the pt.  Richarda Blade, MD 12/15/13 2209

## 2013-12-14 NOTE — H&P (Signed)
[facility] History and Physical  Jonathan [name] NWG:[id number] DOB: July 07, 1941 DOA: 12/14/2013  Referring physician: EDP PCP: Loura Pardon, MD  Specialists:   Chief Complaint:   HPI: Jonathan [name] is a 73 y.o. male who presents to the ED, with confusion, and agitation and slurring of his speech.   He is unable to give a history but his family is at the bedside and he began to have slurring of his speech an was not making any sense when the physical therapist came to his home to work with him today.  He was sent to the ED for evaluation.  He was last admitted to the hospital from 11/28/13  Until  12/08/2013  due to Acute on Chronic respiratory failure and had been sent home on 2 liters NCO2.  In the ED, he was found to have an ABG of pH =7.5/ pCO2= 34 / pO2=127 / HCO3=28.5 / and O2 sat 99% on 2 liters NCO2.     Also a CT scan of the head was performed which was negative for acute findings, and he was referred for medical admission.      Review of Systems: Unable to Obtain from the Patient  Past Medical History  Diagnosis Date  . HTN (hypertension)   . HLD (hyperlipidemia)   . Pulmonary nodule, right     lower lobe  . COPD (chronic obstructive pulmonary disease)   . Rotator cuff injury     right  . Frozen shoulder   . Shoulder pain   . Hyperkalemia   . Dermatitis seborrheica   . Other diseases of lung, not elsewhere classified   . Kidney stone   . Allergic rhinitis   . ED (erectile dysfunction)   . Diabetes mellitus type II   . Asthma   . Tobacco abuse   . Stroke     pt states "last year"      Past Surgical History  Procedure Laterality Date  . Cervical discectomy  1998  . Ett  11/1994  . Tm repair    . Tee without cardioversion  12/03/2011    Procedure: TRANSESOPHAGEAL ECHOCARDIOGRAM (TEE);  Surgeon: Lelon Perla, MD;  Location: [facility] ENDOSCOPY;  Service: Cardiovascular;  Laterality: N/A;       Prior to Admission medications   Medication Sig Start Date End  Date Taking? Authorizing Provider  albuterol (PROVENTIL) (2.5 MG/3ML) 0.083% nebulizer solution Take 3 mLs (2.5 mg total) by nebulization 2 (two) times daily as needed for shortness of breath. 11/26/13  Yes Shanker Kristeen Mans, MD  albuterol (VENTOLIN HFA) 108 (90 BASE) MCG/ACT inhaler Inhale 2 puffs into the lungs every 6 (six) hours as needed for wheezing. 11/22/12  Yes Abner Greenspan, MD  amLODipine (NORVASC) 5 MG tablet Take 5 mg by mouth daily.   Yes Historical Provider, MD  aspirin 81 MG tablet Take 1 tablet (81 mg total) by mouth daily. 11/26/13  Yes Shanker Kristeen Mans, MD  atorvastatin (LIPITOR) 10 MG tablet Take 10 mg by mouth daily.   Yes Historical Provider, MD  glipiZIDE (GLIPIZIDE XL) 10 MG 24 hr tablet Take 1 tablet (10 mg total) by mouth daily. 09/28/13  Yes Abner Greenspan, MD  guaiFENesin (MUCINEX) 600 MG 12 hr tablet Take 1 tablet (600 mg total) by mouth 2 (two) times daily. 11/26/13  Yes Shanker Kristeen Mans, MD  insulin glargine (LANTUS) 100 units/mL SOLN Inject 40 Units into the skin daily at 8 pm.    Yes Historical Provider,  MD  insulin lispro (HUMALOG) 100 UNIT/ML KiwkPen Inject 0-20 Units into the skin 3 (three) times daily with meals. 12/08/13  Yes Corey Harold, NP  metFORMIN (GLUCOPHAGE) 1000 MG tablet Take 500-1,000 mg by mouth See admin instructions. Take 1 tablet in the morning, 0.5 tablet midday and 1 tablet in the evening   Yes Historical Provider, MD  tiotropium (SPIRIVA HANDIHALER) 18 MCG inhalation capsule Place 1 capsule (18 mcg total) into inhaler and inhale daily. 11/26/13  Yes Shanker Kristeen Mans, MD  predniSONE (DELTASONE) 10 MG tablet Take 10 mg by mouth See admin instructions. Take 3 tabs daily x 1 day, then 2 tabs daily x 2 days, then 1 tab daily x2 days then stop. #9. Completed course of medication about two weeks ago as of 12-14-13 12/08/13   Corey Harold, NP      Allergies  Allergen Reactions  . Amoxicillin-Pot Clavulanate Other (See Comments)    Severe stomach pain.    . Quinapril Hcl     REACTION: back pain  . Valsartan     REACTION: back pain  . Varenicline Tartrate     REACTION: AMS, hallucinations, night mares     Social History:  reports that he has been smoking Cigarettes.  He has been smoking about 0.20 packs per day. He has never used smokeless tobacco. He reports that he does not drink alcohol or use illicit drugs.     Family History  Problem Relation Age of Onset  . Asthma Sister   . Emphysema Sister        Physical Exam:  GEN:  Agitated and confused Well Nourished and Well Developed   73 y.o.  Caucasian male  examined  and in no acute distress;  Filed Vitals:   12/14/13 1845 12/14/13 1900 12/14/13 1915 12/14/13 2100  BP: 135/56 131/56 132/58 152/81  Pulse: 83 82 89 96  Temp:    99.3 F (37.4 C)  TempSrc:    Oral  Resp:    18  Height:      Weight:      SpO2: 100% 100% 97% 99%   Blood pressure 152/81, pulse 96, temperature 99.3 F (37.4 C), temperature source Oral, resp. rate 18, height 5\' 8"  (1.727 m), weight 86.183 kg (190 lb), SpO2 99.00%. PSYCH: He is alert and oriented x1; does not appear anxious does not appear depressed; affect is normal HEENT: Normocephalic and Atraumatic, Mucous membranes pink; PERRLA; EOM intact; Fundi:  Benign;  No scleral icterus, Nares: Patent, Oropharynx: Clear,  Fair Dentition, Neck:  FROM, no cervical lymphadenopathy nor thyromegaly or carotid bruit; no JVD; Breasts:: Not examined CHEST WALL: No tenderness CHEST: Normal respiration, clear to auscultation bilaterally HEART: Regular rate and rhythm; no murmurs rubs or gallops BACK: No kyphosis or scoliosis; no CVA tenderness ABDOMEN: Positive Bowel Sounds, soft non-tender; no masses, no organomegaly. Rectal Exam: Not done EXTREMITIES: No cyanosis, clubbing or edema; no ulcerations. Genitalia: not examined PULSES: 2+ and symmetric SKIN: Normal hydration no rash or ulceration CNS:   Alert and Oriented x1,  Speech slurred, + Expressive  Aphasia, and Nonsensical and Inappropriate UnAble to follow 3 step commands without difficulty. In No obvious pain.  Cranial Nerves:  Intact  Motor:   Moving ALL 4 Extremities Sensory: No Sensory Deficits  Deep Tendon Reflexes:  Unable to perform due to lack of cooperation Plantars/ Babinski: Unable  To perform  Cerebellar: Unable to perform due to lack of coordination Gait: deferred  Vascular: pulses palpable throughout  Labs on Admission:  Basic Metabolic Panel:  Recent Labs Lab 12/14/13 1435  NA 136*  K 4.1  CL 95*  CO2 27  GLUCOSE 86  BUN 20  CREATININE 0.72  CALCIUM 9.1   Liver Function Tests:  Recent Labs Lab 12/14/13 1435  AST 20  ALT 45  ALKPHOS 62  BILITOT 1.3*  PROT 5.9*  ALBUMIN 3.2*   No results found for this basename: LIPASE, AMYLASE,  in the last 168 hours  Recent Labs Lab 12/14/13 2147  AMMONIA 17   CBC:  Recent Labs Lab 12/14/13 1435  WBC 15.4*  NEUTROABS 11.0*  HGB 15.9  HCT 45.7  MCV 86.6  PLT 148*   Cardiac Enzymes: No results found for this basename: CKTOTAL, CKMB, CKMBINDEX, TROPONINI,  in the last 168 hours  BNP (last 3 results)  Recent Labs  11/23/13 1224 11/28/13 0830  PROBNP 82.0 463.5*   CBG:  Recent Labs Lab 12/08/13 0740 12/08/13 1204  GLUCAP 119* 239*    Radiological Exams on Admission: Dg Chest 2 View  12/14/2013   CLINICAL DATA:  ALTERED MENTAL STATUS  EXAM: CHEST  2 VIEW  COMPARISON:  DG CHEST 1V PORT dated 12/07/2013  FINDINGS: Low lung volumes. There is increased density projects in the lung bases right greater than left. Cardiac silhouettes within normal limits. Calcified lymph nodes project within the infrahilar region on the left. The osseous structures demonstrate no acute abnormalities.  IMPRESSION: Low lung volumes which accentuates lung findings. There are areas of increased density within the lung bases differential considerations are atelectasis versus infiltrates. Right greater than left.    Electronically Signed   By: Salome Holmes M.D.   On: 12/14/2013 16:07   Ct Head Wo Contrast  12/14/2013   CLINICAL DATA:  Altered mental status  EXAM: CT HEAD WITHOUT CONTRAST  TECHNIQUE: Contiguous axial images were obtained from the base of the skull through the vertex without intravenous contrast. Study was obtained within 24 hr of patient's arrival at the emergency department.  COMPARISON:  Brain CT November 28, 2013 and brain MRI November 29, 2013.  FINDINGS: Mild generalized atrophy remains stable. There is no demonstrable mass, hemorrhage, extra-axial fluid collection, or midline shift. Patchy small vessel disease in the centra semiovale bilaterally is stable. No new gray-white compartment lesions are identified. There is no acute appearing infarct on this study.  The bony calvarium appears intact. Mastoids on the left are clear. Mastoids on the right are partially opacified, stable. There is patchy ethmoid sinus disease bilaterally. Atrophy of the left parotid gland with fatty replacement remains. The visualized right parotid gland appears somewhat hypertrophied but otherwise unremarkable in appearance, unchanged from recent prior study.  IMPRESSION: Atrophy with small vessel disease. No intracranial mass, hemorrhage, or acute appearing infarct.  Atrophy of the left parotid gland with relative hypertrophy of the right parotid gland appears stable. There is right mastoid as well as bilateral ethmoid sinus disease.   Electronically Signed   By: Bretta Bang M.D.   On: 12/14/2013 16:07   Mr Brain Wo Contrast  12/14/2013   CLINICAL DATA:  Aphasia. The examination had to be discontinued prior to completion due to patient combativeness.  EXAM: MRI HEAD WITHOUT CONTRAST  TECHNIQUE: Multiplanar, multiecho pulse sequences of the brain and surrounding structures were obtained without intravenous contrast.  COMPARISON:  CT HEAD W/O CM dated 12/14/2013; MR HEAD W/O CM dated 11/29/2013  FINDINGS: The diffusion-weighted  images demonstrate no evidence for acute or subacute infarction.  Diffuse white matter changes are again noted.  IMPRESSION: 1. No acute abnormality. 2. The study was discontinued after the diffusion-weighted imaging as the patient was combative and noncooperative. 3. Diffuse white matter changes are again noted.   Electronically Signed   By: Lawrence Santiago M.D.   On: 12/14/2013 21:04      EKG: Independently reviewed. Sinus Rhythm with +PVC rate 71 No Acute S-T changes.       Assessment/Plan:   73 y.o. male with  Principal Problem:   CVA (cerebral infarction) Active Problems:   Expressive aphasia   Acute confusional state   HYPERTENSION   COPD   HLD (hyperlipidemia)   Diabetes type 2, uncontrolled   Tobacco abuse    1.  CVA versus TIA-  CVA Workup ordered has hx of TIAs, Seen by Neuro in ED Dr. Nicole Kindred,   Neuro Checks, MRI done  And was negative for acute findings.  Continue CVA/TIA Workup.  ASA Rx.     2.   Expressive Aphasia and Agitation and Confusion-   ?CVA versus TIA versus Encephalopathy.  Same Workup as #1.    3.   HTN- Monitor BPs, IV Hydralazine PRN.  Continue Norvasc, and   4.   COPD-   Stable, continuous O2 discontinued due to Respiratory Alkalosis ph 7.5, and pO2 127 on 2 liters  O2.   Change O2 to PRN,a dn Monitor O2 sats.    5.   Dyslipidemia-   Continue atorvastatin.   check Lipids in AM.    6.  DM2-  SSI coverage and CBG checks q 4hrs, and Check HbA1C in AM.   On Lantus Insulin, Glipizide and Metformin at home.  Hold Metformin  Due to contrast studies.     7.  Tobacco-   On Nicotine patch since hospitalization.  Continue Nicotine Patch.    8.   DVT prophylaxis with Lovenox.       Code Status:   FULL CODE Family Communication:   Family at Bedside  Disposition Plan:    Inpatient   Time spent:  [place] [facility] Pager [phone]  If 7PM-7AM, please contact night-coverage [email or web address] Password [facility] 12/14/2013, 11:29 PM

## 2013-12-14 NOTE — ED Notes (Signed)
On arrival to room C-48 pt was moved to ED bed with some assistance from EMS crew. Pt. Able to tell idetifie last name and birth date . The Pt A/O x2.

## 2013-12-15 ENCOUNTER — Observation Stay (HOSPITAL_COMMUNITY): Payer: Medicare Other

## 2013-12-15 ENCOUNTER — Inpatient Hospital Stay: Payer: Medicare Other | Admitting: Adult Health

## 2013-12-15 ENCOUNTER — Encounter (HOSPITAL_COMMUNITY): Payer: Self-pay | Admitting: *Deleted

## 2013-12-15 DIAGNOSIS — G9340 Encephalopathy, unspecified: Secondary | ICD-10-CM

## 2013-12-15 DIAGNOSIS — J9610 Chronic respiratory failure, unspecified whether with hypoxia or hypercapnia: Secondary | ICD-10-CM

## 2013-12-15 DIAGNOSIS — J690 Pneumonitis due to inhalation of food and vomit: Secondary | ICD-10-CM

## 2013-12-15 LAB — GLUCOSE, CAPILLARY
Glucose-Capillary: 108 mg/dL — ABNORMAL HIGH (ref 70–99)
Glucose-Capillary: 151 mg/dL — ABNORMAL HIGH (ref 70–99)
Glucose-Capillary: 63 mg/dL — ABNORMAL LOW (ref 70–99)
Glucose-Capillary: 71 mg/dL (ref 70–99)
Glucose-Capillary: 81 mg/dL (ref 70–99)
Glucose-Capillary: 144 mg/dL — ABNORMAL HIGH (ref 70–99)
Glucose-Capillary: 171 mg/dL — ABNORMAL HIGH (ref 70–99)

## 2013-12-15 LAB — BASIC METABOLIC PANEL
BUN: 14 mg/dL (ref 6–23)
CO2: 24 mEq/L (ref 19–32)
Calcium: 8.2 mg/dL — ABNORMAL LOW (ref 8.4–10.5)
Chloride: 92 mEq/L — ABNORMAL LOW (ref 96–112)
Creatinine, Ser: 0.7 mg/dL (ref 0.50–1.35)
GFR calc Af Amer: >90 mL/min (ref >90)
GFR calc non Af Amer: >90 mL/min (ref >90)
Glucose, Bld: 87 mg/dL (ref 70–99)
Potassium: 3.4 mEq/L — ABNORMAL LOW (ref 3.7–5.3)
Sodium: 130 mEq/L — ABNORMAL LOW (ref 137–147)

## 2013-12-15 LAB — URINE CULTURE
Colony Count: NO GROWTH
Culture: NO GROWTH

## 2013-12-15 LAB — CBC
HCT: 42.6 % (ref 39.0–52.0)
Hemoglobin: 14.7 g/dL (ref 13.0–17.0)
MCH: 29.8 pg (ref 26.0–34.0)
MCHC: 34.5 g/dL (ref 30.0–36.0)
MCV: 86.4 fL (ref 78.0–100.0)
Platelets: 141 10*3/uL — ABNORMAL LOW (ref 150–400)
RBC: 4.93 MIL/uL (ref 4.22–5.81)
RDW: 13.8 % (ref 11.5–15.5)
WBC: 14 10*3/uL — ABNORMAL HIGH (ref 4.0–10.5)

## 2013-12-15 LAB — HEMOGLOBIN A1C
Hgb A1c MFr Bld: 9 % — ABNORMAL HIGH (ref <5.7)
Mean Plasma Glucose: 212 mg/dL — ABNORMAL HIGH (ref <117)

## 2013-12-15 LAB — LIPID PANEL
Cholesterol: 132 mg/dL (ref 0–200)
HDL: 62 mg/dL (ref >39)
LDL Cholesterol: 37 mg/dL (ref 0–99)
Total CHOL/HDL Ratio: 2.1 RATIO
Triglycerides: 164 mg/dL — ABNORMAL HIGH (ref <150)
VLDL: 33 mg/dL (ref 0–40)

## 2013-12-15 MED ORDER — VANCOMYCIN HCL 10 G IV SOLR
1250.0000 mg | Freq: Two times a day (BID) | INTRAVENOUS | Status: DC
  Administered 2013-12-15 – 2013-12-17 (×5): 1250 mg via INTRAVENOUS
  Filled 2013-12-15 (×6): qty 1250

## 2013-12-15 MED ORDER — INSULIN GLARGINE 100 UNIT/ML ~~LOC~~ SOLN
20.0000 [IU] | Freq: Every day | SUBCUTANEOUS | Status: DC
  Administered 2013-12-15: 20 [IU] via SUBCUTANEOUS
  Filled 2013-12-15 (×2): qty 0.2

## 2013-12-15 MED ORDER — DEXTROSE 50 % IV SOLN
25.0000 mL | Freq: Once | INTRAVENOUS | Status: AC
  Administered 2013-12-15: 25 mL via INTRAVENOUS

## 2013-12-15 MED ORDER — TIOTROPIUM BROMIDE MONOHYDRATE 18 MCG IN CAPS
18.0000 ug | ORAL_CAPSULE | Freq: Every day | RESPIRATORY_TRACT | Status: DC
  Administered 2013-12-16 – 2013-12-19 (×4): 18 ug via RESPIRATORY_TRACT
  Filled 2013-12-15: qty 5

## 2013-12-15 MED ORDER — PIPERACILLIN-TAZOBACTAM 3.375 G IVPB 30 MIN: 3.3750 g | Freq: Three times a day (TID) | INTRAVENOUS | Status: DC

## 2013-12-15 MED ORDER — LEVETIRACETAM 500 MG PO TABS
500.0000 mg | ORAL_TABLET | Freq: Two times a day (BID) | ORAL | Status: DC
  Administered 2013-12-15 – 2013-12-19 (×8): 500 mg via ORAL
  Filled 2013-12-15 (×9): qty 1

## 2013-12-15 MED ORDER — ASPIRIN 81 MG PO CHEW
81.0000 mg | CHEWABLE_TABLET | Freq: Every day | ORAL | Status: DC
  Administered 2013-12-15 – 2013-12-19 (×5): 81 mg via ORAL
  Filled 2013-12-15 (×7): qty 1

## 2013-12-15 MED ORDER — PIPERACILLIN-TAZOBACTAM 3.375 G IVPB
3.3750 g | Freq: Three times a day (TID) | INTRAVENOUS | Status: DC
  Administered 2013-12-15 – 2013-12-19 (×12): 3.375 g via INTRAVENOUS
  Filled 2013-12-15 (×15): qty 50

## 2013-12-15 MED ORDER — DEXTROSE 50 % IV SOLN
INTRAVENOUS | Status: AC
  Filled 2013-12-15: qty 50

## 2013-12-15 MED ORDER — ATORVASTATIN CALCIUM 10 MG PO TABS
10.0000 mg | ORAL_TABLET | Freq: Every day | ORAL | Status: DC
  Administered 2013-12-15 – 2013-12-19 (×5): 10 mg via ORAL
  Filled 2013-12-15 (×5): qty 1

## 2013-12-15 MED ORDER — POTASSIUM CHLORIDE 10 MEQ/100ML IV SOLN
10.0000 meq | INTRAVENOUS | Status: AC
  Administered 2013-12-15 (×2): 10 meq via INTRAVENOUS
  Filled 2013-12-15 (×2): qty 100

## 2013-12-15 MED ORDER — NICOTINE 14 MG/24HR TD PT24
14.0000 mg | MEDICATED_PATCH | Freq: Every day | TRANSDERMAL | Status: DC
  Administered 2013-12-15 – 2013-12-19 (×5): 14 mg via TRANSDERMAL
  Filled 2013-12-15 (×5): qty 1

## 2013-12-15 NOTE — Progress Notes (Addendum)
UR completed.  Patient changed to inpatient 12/16/13. Requiring- IV antibiotics and IVF @ 125cc/hr

## 2013-12-15 NOTE — Progress Notes (Signed)
Nutrition Brief Note  Patient identified on the Malnutrition Screening Tool (MST) Report. Discussed nutrition hx with family at bedside. Pt eating well PTA with overall stable weight. Pt with good appetite and ready to try to eat meals (just seen by SLP with orders for Dysphagia 3.)  Wt Readings from Last 15 Encounters:  12/14/13 190 lb (86.183 kg)  12/05/13 179 lb 8 oz (81.421 kg)  11/26/13 187 lb 6.3 oz (85 kg)  05/31/13 190 lb 12 oz (86.524 kg)  02/28/13 194 lb (87.998 kg)  11/22/12 189 lb 12 oz (86.07 kg)  07/05/12 191 lb 8 oz (86.864 kg)  03/29/12 192 lb 12 oz (87.431 kg)  12/18/11 192 lb 4 oz (87.204 kg)  12/01/11 183 lb 10.3 oz (83.3 kg)  12/01/11 183 lb 10.3 oz (83.3 kg)  03/17/11 195 lb 12.8 oz (88.814 kg)  11/26/10 196 lb 6.1 oz (89.078 kg)  10/28/10 192 lb 12 oz (87.431 kg)  09/09/10 193 lb (87.544 kg)    Body mass index is 28.9 kg/(m^2). Patient meets criteria for overweight based on current BMI.   Current diet order is Dysphagia 3. Labs and medications reviewed.   No nutrition interventions warranted at this time. If nutrition issues arise, please consult RD.   Inda Coke MS, RD, LDN Inpatient Registered Dietitian Pager: [phone] After-hours pager: [phone]

## 2013-12-15 NOTE — Progress Notes (Signed)
Hypoglycemic Event  CBG: 63  Treatment: D50 IV 25 mL  Symptoms: None  Follow-up CBG: [id number]  CBG Result:151  Possible Reasons for Event:  Inadequate meal intake  Comments/MD notified:K Baltazar Najjar notified    Jonathan [name], Jonathan [name]  Remember to initiate Hypoglycemia Order Set & complete

## 2013-12-15 NOTE — Evaluation (Signed)
Physical Therapy Evaluation Patient Details Name: Jonathan [name] MRN: [id number] DOB: Feb 12, 1941 Today's Date: 12/15/2013 Time: 2426-8341 PT Time Calculation (min): 22 min  PT Assessment / Plan / Recommendation History of Present Illness  pt presents with Confusion and speech difficulties.    Clinical Impression  Pt lethargic, but able to be aroused.  Pt needs MinA with mobility and would benefit from CIR at D/C to maximize independence prior to returning to home.  Will continue to follow.      PT Assessment  Patient needs continued PT services    Follow Up Recommendations  CIR    Does the patient have the potential to tolerate intense rehabilitation      Barriers to Discharge        Equipment Recommendations  Rolling walker with 5" wheels    Recommendations for Other Services Rehab consult   Frequency Min 4X/week    Precautions / Restrictions Precautions Precautions: Fall Restrictions Weight Bearing Restrictions: No   Pertinent Vitals/Pain Indicates chronic back pain.        Mobility  Bed Mobility Overal bed mobility: Needs Assistance Bed Mobility: Supine to Sit;Sit to Supine Supine to sit: Min assist Sit to supine: Supervision General bed mobility comments: Increased time needed and use of bed rail.   Transfers Overall transfer level: Needs assistance Equipment used: None Transfers: Sit to/from Stand Sit to Stand: Min assist General transfer comment: cues for hand placement and safety    Exercises     PT Diagnosis: Difficulty walking;Generalized weakness  PT Problem List: Decreased strength;Decreased activity tolerance;Decreased balance;Decreased mobility;Decreased coordination;Decreased knowledge of use of DME PT Treatment Interventions: DME instruction;Gait training;Stair training;Functional mobility training;Therapeutic activities;Therapeutic exercise;Balance training;Neuromuscular re-education;Cognitive remediation;Patient/family education     PT  Goals(Current goals can be found in the care plan section) Acute Rehab PT Goals Patient Stated Goal: Go home PT Goal Formulation: With patient/family Time For Goal Achievement: 12/29/13 Potential to Achieve Goals: Good  Visit Information  Last PT Received On: 12/15/13 Assistance Needed: +1 History of Present Illness: pt presents with Confusion and speech difficulties.         Prior [place] expects to be discharged to:: Inpatient rehab Living Arrangements: Spouse/significant other Available Help at Discharge: Family Type of Home: Mobile home Home Access: Stairs to enter Entrance Stairs-Number of Steps: 3 Entrance Stairs-Rails: Can reach both Home Layout: One level Home Equipment: Walker - 4 wheels Additional Comments: pt was on O2 after last couple hospitalizations.   Prior Function Level of Independence: Independent with assistive device(s) Comments: Uses cane at times. Communication Communication: No difficulties    Cognition  Cognition Arousal/Alertness: Lethargic Behavior During Therapy: Flat affect Overall Cognitive Status: Difficult to assess Difficult to assess due to: Level of arousal    Extremity/Trunk Assessment Upper Extremity Assessment Upper Extremity Assessment: Defer to OT evaluation Lower Extremity Assessment Lower Extremity Assessment: Generalized weakness Cervical / Trunk Assessment Cervical / Trunk Assessment: Normal   Balance Balance Overall balance assessment: Needs assistance Sitting-balance support: No upper extremity supported;Feet supported Sitting balance-Leahy Scale: Fair Standing balance support: No upper extremity supported Standing balance-Leahy Scale: Poor Standing balance comment: pt requires MinA to maintain balance as pt with mild Bil sway.    End of Session PT - End of Session Equipment Utilized During Treatment: Oxygen;Gait belt Activity Tolerance: Patient limited by lethargy;Patient limited by  fatigue Patient left: in bed (with transport for EEG) Nurse Communication: Mobility status  GP Functional Assessment Tool Used: Clinical Judgement Functional Limitation: Mobility:  Walking and moving around Mobility: Walking and Moving Around Current Status [phone]): At least 20 percent but less than 40 percent impaired, limited or restricted Mobility: Walking and Moving Around Goal Status [phone]): 0 percent impaired, limited or restricted   Catarina Hartshorn, [place] 12/15/2013, 12:51 PM

## 2013-12-15 NOTE — Progress Notes (Signed)
[facility] PROGRESS NOTE  Jonathan [name] [id number] DOB: Jan 25, 1941 DOA: 12/14/2013 PCP: Loura Pardon, MD  Assessment/Plan: Acute encephalopathy/dysarthria -Improving, but not back to baseline -MRI brain negative for acute infarct -Concerned about aspiration pneumonitis/HCAP -11/28/2013 echocardiogram EF 55-60% -Ammonia 17 -11/28/2013 TSH 0.447 -Urinalysis negative for pyuria -Urine drug screen negative HCAP/Aspiration pneumonia -Blood cultures x2 sets -speech therapy eval for swallowing -empiric vancomycin and zosyn pending culture data -Patient failed RN swallow evaluation 12/14/2013 COPD -50-pack-year history -Stable -On 2 L nasal cannula at home Diabetes mellitus type 2, uncontrolled -11/28/2013 hemoglobin A1c 8.4 -Restart half home dose of Lantus Hyperlipidemia -Continue statin Hypertension -Hold amlodipine as the patient's blood pressure is soft Chronic respiratory failure -The patient is maintained on 2 L nasal cannula at home Hyponatremia -Likely volume depletion -IVF  Family Communication:   Daughter at beside Disposition Plan:   Home when medically stable   Antibiotics:  Vancomycin 12/15/13>>>  Zosyn 12/15/13>>>    Procedures/Studies: Dg Chest 2 View  12/14/2013   CLINICAL DATA:  ALTERED MENTAL STATUS  EXAM: CHEST  2 VIEW  COMPARISON:  DG CHEST 1V PORT dated 12/07/2013  FINDINGS: Low lung volumes. There is increased density projects in the lung bases right greater than left. Cardiac silhouettes within normal limits. Calcified lymph nodes project within the infrahilar region on the left. The osseous structures demonstrate no acute abnormalities.  IMPRESSION: Low lung volumes which accentuates lung findings. There are areas of increased density within the lung bases differential considerations are atelectasis versus infiltrates. Right greater than left.   Electronically Signed   By: Margaree Mackintosh M.D.   On: 12/14/2013 16:07   Dg Chest 2  View  12/02/2013   CLINICAL DATA:  Shortness of Breath  EXAM: CHEST  2 VIEW  COMPARISON:  Chest radiograph November 28, 2013 and chest CT November 28, 2013  FINDINGS: There is a degree of underlying emphysematous change. There is no edema or consolidation. Heart is borderline enlarged with normal pulmonary vascularity. No adenopathy. No bone lesions.  IMPRESSION: No edema or consolidation. Evidence of a degree of underlying emphysematous change. Mild cardiac prominence.   Electronically Signed   By: Lowella Grip M.D.   On: 12/02/2013 08:20   Dg Chest 2 View  11/23/2013   CLINICAL DATA:  Shortness of breath and cough  EXAM: CHEST  2 VIEW  COMPARISON:  12/02/2011  FINDINGS: Mild interstitial coarsening which appears chronic. No evidence of consolidation in the lateral projection. No edema, effusion, or pneumothorax. Normal heart size.  IMPRESSION: Stable exam.  No edema or consolidation.   Electronically Signed   By: Jorje Guild M.D.   On: 11/23/2013 14:09   Ct Head Wo Contrast  12/14/2013   CLINICAL DATA:  Altered mental status  EXAM: CT HEAD WITHOUT CONTRAST  TECHNIQUE: Contiguous axial images were obtained from the base of the skull through the vertex without intravenous contrast. Study was obtained within 24 hr of patient's arrival at the emergency department.  COMPARISON:  Brain CT November 28, 2013 and brain MRI November 29, 2013.  FINDINGS: Mild generalized atrophy remains stable. There is no demonstrable mass, hemorrhage, extra-axial fluid collection, or midline shift. Patchy small vessel disease in the centra semiovale bilaterally is stable. No new gray-white compartment lesions are identified. There is no acute appearing infarct on this study.  The bony calvarium appears intact. Mastoids on the left are clear. Mastoids on the right are partially opacified, stable. There is patchy ethmoid sinus disease bilaterally. Atrophy of the  left parotid gland with fatty replacement remains. The visualized right parotid  gland appears somewhat hypertrophied but otherwise unremarkable in appearance, unchanged from recent prior study.  IMPRESSION: Atrophy with small vessel disease. No intracranial mass, hemorrhage, or acute appearing infarct.  Atrophy of the left parotid gland with relative hypertrophy of the right parotid gland appears stable. There is right mastoid as well as bilateral ethmoid sinus disease.   Electronically Signed   By: Lowella Grip M.D.   On: 12/14/2013 16:07   Ct Head Wo Contrast  11/28/2013   CLINICAL DATA:  Fall.  EXAM: CT HEAD WITHOUT CONTRAST  TECHNIQUE: Contiguous axial images were obtained from the base of the skull through the vertex without intravenous contrast.  COMPARISON:  MR HEAD W/O CM dated 12/01/2011  FINDINGS: The ventricles and sulci are normal for age. No intraparenchymal hemorrhage, mass effect nor midline shift. Patchy supratentorial white matter hypodensities are within normal range for patient's age and though non-specific suggest sequelae of chronic small vessel ischemic disease. No acute large vascular territory infarcts.  No abnormal extra-axial fluid collections. Basal cisterns are patent. Moderate calcific atherosclerosis of the carotid siphons.  No skull fracture. Small left maxillary mucosal retention cyst, mild paranasal sinus mucosal thickening without air-fluid levels. Mastoid air cells are well aerated. Moderate temporomandibular osteoarthrosis. . The included ocular globes and orbital contents are non-suspicious. Marked asymmetry of the parotid glands, the right appears enlarged in the left appears very fatty replaced.  IMPRESSION: No acute intracranial process.  Moderate white matter changes suggest chronic small vessel ischemic disease.  Asymmetric parotid glands, the right appears mildly edematous and the left appears fatty replaced versus resected with flap, recommend clinical correlation and correlation with surgical history.   Electronically Signed   By: Elon Alas   On: 11/28/2013 05:40   Ct Angio Chest Pe W/cm &/or Wo Cm  11/28/2013   CLINICAL DATA:  Shortness of breath. Found down. Evaluate for potential pulmonary embolism.  EXAM: CT ANGIOGRAPHY CHEST WITH CONTRAST  TECHNIQUE: Multidetector CT imaging of the chest was performed using the standard protocol during bolus administration of intravenous contrast. Multiplanar CT image reconstructions and MIPs were obtained to evaluate the vascular anatomy.  CONTRAST:  162mL OMNIPAQUE IOHEXOL 350 MG/ML SOLN  COMPARISON:  Chest CT 05/07/2010.  FINDINGS: Mediastinum: Study is limited by patient respiratory motion. With these limitations in mind, there is no central, lobar or segmental sized pulmonary embolism. Accurate assessment for smaller subsegmental sized pulmonary embolism is not possible secondary to the limitations of this examination. Heart size is normal. There is no significant pericardial fluid, thickening or pericardial calcification. There is atherosclerosis of the thoracic aorta, the great vessels of the mediastinum and the coronary arteries, including calcified atherosclerotic plaque in the left main, left anterior descending, left circumflex right coronary arteries. No pathologically enlarged mediastinal or hilar lymph nodes. Esophagus is unremarkable in appearance.  Lungs/Pleura: Moderate centrilobular emphysema, most pronounced in the lung apices. No acute consolidative airspace disease. No pleural effusions. 5 mm right lower lobe nodule (image 89 of series 6), unchanged compared to prior study from 05/07/2010; this can be considered benign and does not require image followup). No other larger suspicious appearing pulmonary nodules or masses are identified on today's examination.  Upper Abdomen: Unremarkable.  Musculoskeletal: There are no aggressive appearing lytic or blastic lesions noted in the visualized portions of the skeleton.  Review of the MIP images confirms the above findings.  IMPRESSION: 1.  Limited examination secondary to patient  motion demonstrated no evidence of clinically significant central, lobar or segmental sized pulmonary embolism. Smaller subsegmental sized pulmonary embolism cannot be entirely excluded. 2. No acute findings in the thorax to account for the patient's symptoms. 3. Atherosclerosis, including left main and 3 vessel coronary artery disease. Assessment for potential risk factor modification, dietary therapy or pharmacologic therapy may be warranted, if clinically indicated. 4. Moderate centrilobular emphysema. 5. Additional incidental findings, as above.   Electronically Signed   By: Vinnie Langton M.D.   On: 11/28/2013 14:15   Mr Brain Wo Contrast  12/14/2013   CLINICAL DATA:  Aphasia. The examination had to be discontinued prior to completion due to patient combativeness.  EXAM: MRI HEAD WITHOUT CONTRAST  TECHNIQUE: Multiplanar, multiecho pulse sequences of the brain and surrounding structures were obtained without intravenous contrast.  COMPARISON:  CT HEAD W/O CM dated 12/14/2013; MR HEAD W/O CM dated 11/29/2013  FINDINGS: The diffusion-weighted images demonstrate no evidence for acute or subacute infarction. Diffuse white matter changes are again noted.  IMPRESSION: 1. No acute abnormality. 2. The study was discontinued after the diffusion-weighted imaging as the patient was combative and noncooperative. 3. Diffuse white matter changes are again noted.   Electronically Signed   By: Lawrence Santiago M.D.   On: 12/14/2013 21:04   Mr Brain Wo Contrast  11/29/2013   CLINICAL DATA:  Confusion.  Hypertension.  COPD.  EXAM: MRI HEAD WITHOUT CONTRAST  TECHNIQUE: Multiplanar, multiecho pulse sequences of the brain and surrounding structures were obtained without intravenous contrast.  COMPARISON:  CT HEAD W/O CM dated 11/28/2013; MR HEAD W/O CM dated 12/01/2011; MR MRA HEAD W/O CM dated 12/01/2011  FINDINGS: No evidence for acute infarction, hemorrhage, mass lesion, hydrocephalus, or  extra-axial fluid. Advanced atrophy. Extensive chronic microvascular ischemic change throughout the periventricular and subcortical white matter. No foci of chronic hemorrhage. Pituitary, pineal, and cerebellar tonsils unremarkable. No upper cervical lesions. Visualized calvarium, skull base, and upper cervical osseous structures unremarkable. Scalp and extracranial soft tissues, orbits, and sinuses show no acute process. Chronic right mastoid fluid likely effusion was demonstrated in 2013.  Left parotid remains atrophic as compared to the right. This appearance was present in 2013 and is stable.  IMPRESSION: Advanced atrophy with extensive small vessel disease. Cyst similar to priors. No acute intracranial findings.  Atrophic left parotid gland.   Electronically Signed   By: Rolla Flatten M.D.   On: 11/29/2013 15:49   US Renal  12/03/2013   CLINICAL DATA:  Assess for perinephric abscess.  EXAM: RENAL/URINARY TRACT ULTRASOUND COMPLETE  COMPARISON:  07/09/2009  FINDINGS: Right Kidney:  Length: 12.5 cm. Echogenicity within normal limits. No mass or hydronephrosis visualized.  Left Kidney:  Length: 3.8 cm. Echogenicity within normal limits. No mass or hydronephrosis visualized. Cyst within the inferior pole is again noted measuring 2.8 x 3.2 x 3.4 cm. This contains some diffuse low level internal echoes. Increased through transmission is identified.  Bladder:  Appears normal for degree of bladder distention.  IMPRESSION: 1. No evidence for renal abscess. 2. Stable cyst within the inferior pole of the left kidney.   Electronically Signed   By: Kerby Moors M.D.   On: 12/03/2013 11:23   Dg Chest Port 1 View  12/07/2013   CLINICAL DATA Hypoxia  EXAM PORTABLE CHEST - 1 VIEW  COMPARISON December 05, 2013  FINDINGS There is underlying emphysematous change. There is no edema or consolidation. The heart is upper normal in size. The pulmonary vascularity reflects underlying emphysema.  No adenopathy. There is arthropathy in  both shoulders.  IMPRESSION Underlying emphysematous change.  No edema or consolidation.  SIGNATURE  Electronically Signed   By: Lowella Grip M.D.   On: 12/07/2013 07:15   Dg Chest Port 1 View  12/05/2013   CLINICAL DATA:  Shortness of breath.  EXAM: PORTABLE CHEST - 1 VIEW  COMPARISON:  DG CHEST 1V PORT dated 12/04/2013; DG CHEST 2 VIEW dated 12/02/2013; CT ANGIO CHEST W/CM &/OR WO/CM dated 11/28/2013; DG CHEST 2 VIEW dated 11/23/2013  FINDINGS: The mediastinum and hilar structures are normal. Stable cardiomegaly. Normal pulmonary vascularity. Mild interstitial prominence noted in the lung bases. Active pneumonitis cannot be excluded. No pleural effusion or pneumothorax. No acute osseous abnormality.  IMPRESSION: Mild interstitial prominence within the lung bases with low lung volumes. Active pneumonitis cannot be excluded.   Electronically Signed   By: Marcello Moores  Register   On: 12/05/2013 07:37   Dg Chest Port 1 View  12/04/2013   CLINICAL DATA:  Assess infiltrates, edema  EXAM: PORTABLE CHEST - 1 VIEW  COMPARISON:  12/03/2013  FINDINGS: Unchanged interstitial coarsening and hyperinflation. No edema or consolidation. Normal heart size. No visible effusion. No pneumothorax.  IMPRESSION: 1. No edema or consolidation. 2. COPD.   Electronically Signed   By: Jorje Guild M.D.   On: 12/04/2013 05:32   Dg Chest Port 1 View  12/03/2013   CLINICAL DATA:  Respiratory distress  EXAM: PORTABLE CHEST - 1 VIEW  COMPARISON:  12/02/2013  FINDINGS: The heart size appears normal. There is no pleural effusion or edema identified. No airspace consolidation.  IMPRESSION: 1. Stable radiograph of the chest. 2. No pneumonia or heart failure identified.   Electronically Signed   By: Kerby Moors M.D.   On: 12/03/2013 08:54   Dg Chest Portable 1 View  11/28/2013   CLINICAL DATA:  Loss of consciousness  EXAM: PORTABLE CHEST - 1 VIEW  COMPARISON:  11/23/2013  FINDINGS: Chronic interstitial coarsening. No edema or consolidation. No  visible effusion or pneumothorax. Normal heart size for technique.  IMPRESSION: No edema or consolidation.   Electronically Signed   By: Jorje Guild M.D.   On: 11/28/2013 02:56         Subjective: Patient is more alert but remained pleasantly confused. Denies any fevers, chills, chest discomfort, shortness breath, nausea, vomiting, diarrhea, abdominal pain, headaches, visual disturbance.   Objective: Filed Vitals:   12/14/13 2100 12/14/13 2130 12/15/13 0124 12/15/13 0600  BP: 152/81  105/67 102/61  Pulse: 96  84 64  Temp: 99.3 F (37.4 C)  99.1 F (37.3 C) 97.1 F (36.2 C)  TempSrc: Oral  Oral Oral  Resp: 18  20 18   Height:  5\' 8"  (1.727 m)    Weight:      SpO2: 99%  96% 98%    Intake/Output Summary (Last 24 hours) at 12/15/13 1045 Last data filed at 12/14/13 1941  Gross per 24 hour  Intake      0 ml  Output    600 ml  Net   -600 ml   Weight change:  Exam:   General:  Pt is alert, follows commands appropriately, not in acute distress  HEENT: No icterus, No thrush,  [place]/AT  Cardiovascular: RRR, S1/S2, no rubs, no gallops  Respiratory: Diminished breath sounds bilateral. No wheezing. Good air movement. Bibasilar rales, right grip on left.   Abdomen: Soft/+BS, non tender, non distended, no guarding  Extremities: trace LE edema, No lymphangitis, No petechiae,  No rashes, no synovitis  Data Reviewed: Basic Metabolic Panel:  Recent Labs Lab 12/14/13 1435 12/15/13 0635  NA 136* 130*  K 4.1 3.4*  CL 95* 92*  CO2 27 24  GLUCOSE 86 87  BUN 20 14  CREATININE 0.72 0.70  CALCIUM 9.1 8.2*   Liver Function Tests:  Recent Labs Lab 12/14/13 1435  AST 20  ALT 45  ALKPHOS 62  BILITOT 1.3*  PROT 5.9*  ALBUMIN 3.2*   No results found for this basename: LIPASE, AMYLASE,  in the last 168 hours  Recent Labs Lab 12/14/13 2147  AMMONIA 17   CBC:  Recent Labs Lab 12/14/13 1435 12/15/13 0635  WBC 15.4* 14.0*  NEUTROABS 11.0*  --   HGB 15.9 14.7  HCT  45.7 42.6  MCV 86.6 86.4  PLT 148* 141*   Cardiac Enzymes: No results found for this basename: CKTOTAL, CKMB, CKMBINDEX, TROPONINI,  in the last 168 hours BNP: No components found with this basename: POCBNP,  CBG:  Recent Labs Lab 12/08/13 1204  GLUCAP 239*    Recent Results (from the past 240 hour(s))  CULTURE, BLOOD (ROUTINE X 2)     Status: None   Collection Time    12/14/13  3:03 PM      Result Value Ref Range Status   Specimen Description BLOOD HAND LEFT   Final   Special Requests BOTTLES DRAWN AEROBIC AND ANAEROBIC 5CC   Final   Culture  Setup Time     Final   Value: 12/14/2013 19:29     Performed at [facility]   Culture     Final   Value:        BLOOD CULTURE RECEIVED NO GROWTH TO DATE CULTURE WILL BE HELD FOR 5 DAYS BEFORE ISSUING A FINAL NEGATIVE REPORT     Performed at [facility]   Report Status PENDING   Incomplete  CULTURE, BLOOD (ROUTINE X 2)     Status: None   Collection Time    12/14/13  3:03 PM      Result Value Ref Range Status   Specimen Description BLOOD HAND RIGHT   Final   Special Requests BOTTLES DRAWN AEROBIC AND ANAEROBIC 5CCBLUE 1CCRED   Final   Culture  Setup Time     Final   Value: 12/14/2013 19:29     Performed at [facility]   Culture     Final   Value:        BLOOD CULTURE RECEIVED NO GROWTH TO DATE CULTURE WILL BE HELD FOR 5 DAYS BEFORE ISSUING A FINAL NEGATIVE REPORT     Performed at [facility]   Report Status PENDING   Incomplete     Scheduled Meds: . sodium chloride   Intravenous STAT  . enoxaparin (LOVENOX) injection  40 mg Subcutaneous Q24H  . insulin aspart  0-9 Units Subcutaneous 6 times per day  . nicotine  14 mg Transdermal Daily  . piperacillin-tazobactam  3.375 g Intravenous 3 times per day  . potassium chloride  10 mEq Intravenous Q1 Hr x 2  . sodium chloride  500 mL Intravenous Once  . sodium chloride  3 mL Intravenous Q12H   Continuous Infusions: . sodium chloride Stopped  (12/14/13 1810)  . sodium chloride    . sodium chloride       Jesusita Jocelyn, DO  [facility] Pager [phone]  If 7PM-7AM, please contact night-coverage [email or web address] Password [email or web address] 12/15/2013, 10:45 AM   LOS: 1 day

## 2013-12-15 NOTE — Progress Notes (Addendum)
NEURO HOSPITALIST PROGRESS NOTE   SUBJECTIVE:                                                                                                                         Patient has no complaints, is aware of where he is and that he was confused.  He is alert and oriented following commands.  OBJECTIVE:                                                                                                                           Vital signs in last 24 hours: Temp:  [97.1 F (36.2 C)-99.3 F (37.4 C)] 97.7 F (36.5 C) (03/19 1010) Pulse Rate:  [41-96] 55 (03/19 1010) Resp:  [18-20] 18 (03/19 1010) BP: (84-163)/(50-81) 110/55 mmHg (03/19 1010) SpO2:  [69 %-100 %] 99 % (03/19 1010)  Intake/Output from previous day: 03/18 0701 - 03/19 0700 In: -  Out: 600 [Urine:600] Intake/Output this shift:   Nutritional status: Dysphagia  Past Medical History  Diagnosis Date  . HTN (hypertension)   . HLD (hyperlipidemia)   . Pulmonary nodule, right     lower lobe  . COPD (chronic obstructive pulmonary disease)   . Rotator cuff injury     right  . Frozen shoulder   . Shoulder pain   . Hyperkalemia   . Dermatitis seborrheica   . Other diseases of lung, not elsewhere classified   . Kidney stone   . Allergic rhinitis   . ED (erectile dysfunction)   . Diabetes mellitus type II   . Asthma   . Tobacco abuse   . Stroke     pt states "last year"     Neurologic Exam:  Mental Status: Alert, oriented, thought content appropriate.  Speech fluent without evidence of aphasia.  Able to follow 3 step commands without difficulty. Cranial Nerves: II:  Visual fields grossly normal, pupils equal, round, reactive to light and accommodation III,IV, VI: ptosis not present, extra-ocular motions intact bilaterally V,VII: smile symmetric, facial light touch sensation normal bilaterally VIII: hearing normal bilaterally IX,X: gag reflex present XI: bilateral shoulder  shrug XII: midline tongue extension without atrophy or fasciculations  Motor: Right : Upper extremity   5/5    Left:  Upper extremity   5/5  Lower extremity   5/5     Lower extremity   5/5 Tone and bulk:normal tone throughout; no atrophy noted Sensory: Pinprick and light touch intact throughout, bilaterally Deep Tendon Reflexes:  2+ bilateral UE and no LE reflexes.   Plantars: Mute bilaterally Cerebellar: normal finger-to-nose,  normal heel-to-shin test    Lab Results: Basic Metabolic Panel:  Recent Labs Lab 12/14/13 1435 12/15/13 0635  NA 136* 130*  K 4.1 3.4*  CL 95* 92*  CO2 27 24  GLUCOSE 86 87  BUN 20 14  CREATININE 0.72 0.70  CALCIUM 9.1 8.2*    Liver Function Tests:  Recent Labs Lab 12/14/13 1435  AST 20  ALT 45  ALKPHOS 62  BILITOT 1.3*  PROT 5.9*  ALBUMIN 3.2*   No results found for this basename: LIPASE, AMYLASE,  in the last 168 hours  Recent Labs Lab 12/14/13 2147  AMMONIA 17    CBC:  Recent Labs Lab 12/14/13 1435 12/15/13 0635  WBC 15.4* 14.0*  NEUTROABS 11.0*  --   HGB 15.9 14.7  HCT 45.7 42.6  MCV 86.6 86.4  PLT 148* 141*    Cardiac Enzymes: No results found for this basename: CKTOTAL, CKMB, CKMBINDEX, TROPONINI,  in the last 168 hours  Lipid Panel:  Recent Labs Lab 12/15/13 0635  CHOL 132  TRIG 164*  HDL 62  CHOLHDL 2.1  VLDL 33  LDLCALC 37    CBG:  Recent Labs Lab 12/14/13 2335 12/15/13 0418 12/15/13 0454 12/15/13 0808 12/15/13 1136  GLUCAP 81 63* 151* 71 108*    Microbiology: Results for orders placed during the hospital encounter of 12/14/13  CULTURE, BLOOD (ROUTINE X 2)     Status: None   Collection Time    12/14/13  3:03 PM      Result Value Ref Range Status   Specimen Description BLOOD HAND LEFT   Final   Special Requests BOTTLES DRAWN AEROBIC AND ANAEROBIC 5CC   Final   Culture  Setup Time     Final   Value: 12/14/2013 19:29     Performed at [facility]   Culture     Final    Value:        BLOOD CULTURE RECEIVED NO GROWTH TO DATE CULTURE WILL BE HELD FOR 5 DAYS BEFORE ISSUING A FINAL NEGATIVE REPORT     Performed at [facility]   Report Status PENDING   Incomplete  CULTURE, BLOOD (ROUTINE X 2)     Status: None   Collection Time    12/14/13  3:03 PM      Result Value Ref Range Status   Specimen Description BLOOD HAND RIGHT   Final   Special Requests BOTTLES DRAWN AEROBIC AND ANAEROBIC 5CCBLUE 1CCRED   Final   Culture  Setup Time     Final   Value: 12/14/2013 19:29     Performed at [facility]   Culture     Final   Value:        BLOOD CULTURE RECEIVED NO GROWTH TO DATE CULTURE WILL BE HELD FOR 5 DAYS BEFORE ISSUING A FINAL NEGATIVE REPORT     Performed at [facility]   Report Status PENDING   Incomplete  URINE CULTURE     Status: None   Collection Time    12/14/13  4:52 PM      Result Value Ref Range Status   Specimen Description URINE, CATHETERIZED   Final  Special Requests NONE   Final   Culture  Setup Time     Final   Value: 12/14/2013 17:12     Performed at [facility]     Final   Value: NO GROWTH     Performed at [facility]   Culture     Final   Value: NO GROWTH     Performed at [facility]   Report Status 12/15/2013 FINAL   Final    Coagulation Studies: No results found for this basename: LABPROT, INR,  in the last 72 hours  Imaging: Dg Chest 2 View  12/14/2013   CLINICAL DATA:  ALTERED MENTAL STATUS  EXAM: CHEST  2 VIEW  COMPARISON:  DG CHEST 1V PORT dated 12/07/2013  FINDINGS: Low lung volumes. There is increased density projects in the lung bases right greater than left. Cardiac silhouettes within normal limits. Calcified lymph nodes project within the infrahilar region on the left. The osseous structures demonstrate no acute abnormalities.  IMPRESSION: Low lung volumes which accentuates lung findings. There are areas of increased density within the lung bases differential  considerations are atelectasis versus infiltrates. Right greater than left.   Electronically Signed   By: Margaree Mackintosh M.D.   On: 12/14/2013 16:07   Ct Head Wo Contrast  12/14/2013   CLINICAL DATA:  Altered mental status  EXAM: CT HEAD WITHOUT CONTRAST  TECHNIQUE: Contiguous axial images were obtained from the base of the skull through the vertex without intravenous contrast. Study was obtained within 24 hr of patient's arrival at the emergency department.  COMPARISON:  Brain CT November 28, 2013 and brain MRI November 29, 2013.  FINDINGS: Mild generalized atrophy remains stable. There is no demonstrable mass, hemorrhage, extra-axial fluid collection, or midline shift. Patchy small vessel disease in the centra semiovale bilaterally is stable. No new gray-white compartment lesions are identified. There is no acute appearing infarct on this study.  The bony calvarium appears intact. Mastoids on the left are clear. Mastoids on the right are partially opacified, stable. There is patchy ethmoid sinus disease bilaterally. Atrophy of the left parotid gland with fatty replacement remains. The visualized right parotid gland appears somewhat hypertrophied but otherwise unremarkable in appearance, unchanged from recent prior study.  IMPRESSION: Atrophy with small vessel disease. No intracranial mass, hemorrhage, or acute appearing infarct.  Atrophy of the left parotid gland with relative hypertrophy of the right parotid gland appears stable. There is right mastoid as well as bilateral ethmoid sinus disease.   Electronically Signed   By: Lowella Grip M.D.   On: 12/14/2013 16:07   Mr Brain Wo Contrast  12/14/2013   CLINICAL DATA:  Aphasia. The examination had to be discontinued prior to completion due to patient combativeness.  EXAM: MRI HEAD WITHOUT CONTRAST  TECHNIQUE: Multiplanar, multiecho pulse sequences of the brain and surrounding structures were obtained without intravenous contrast.  COMPARISON:  CT HEAD W/O CM  dated 12/14/2013; MR HEAD W/O CM dated 11/29/2013  FINDINGS: The diffusion-weighted images demonstrate no evidence for acute or subacute infarction. Diffuse white matter changes are again noted.  IMPRESSION: 1. No acute abnormality. 2. The study was discontinued after the diffusion-weighted imaging as the patient was combative and noncooperative. 3. Diffuse white matter changes are again noted.   Electronically Signed   By: Lawrence Santiago M.D.   On: 12/14/2013 21:04    EEG pending  A1c 9.0  LDL 37  Ammonia 17  11/28/2013 TSH 0.447  Echo Study Conclusions  - Left ventricle: The cavity size was normal. Systolic function was normal. The estimated ejection fraction was in the range of 55% to 60%. Wall motion was normal; there were no regional wall motion abnormalities. - Impressions: Overall very poor image quality Impressions:  - Overall very poor image quality    MEDICATIONS                                                                                                                        Scheduled: . aspirin  81 mg Oral Daily  . atorvastatin  10 mg Oral q1800  . enoxaparin (LOVENOX) injection  40 mg Subcutaneous Q24H  . insulin aspart  0-9 Units Subcutaneous 6 times per day  . insulin glargine  20 Units Subcutaneous QHS  . nicotine  14 mg Transdermal Daily  . piperacillin-tazobactam (ZOSYN)  IV  3.375 g Intravenous 3 times per day  . potassium chloride  10 mEq Intravenous Q1 Hr x 2  . sodium chloride  500 mL Intravenous Once  . sodium chloride  3 mL Intravenous Q12H  . tiotropium  18 mcg Inhalation Daily  . vancomycin  1,250 mg Intravenous Q12H    ASSESSMENT/PLAN:                                                                                                            73 YO male with new onset altered mentation and nonsensical speech. Currently he is able to hold conversation and tell me where he is, date, year and follow all commands.  MRI is negative for acute CVA.   EEG pending.  Continues to have elevated WBC (on Vanc and zosyn) but has remained afebrile.  Current NA+ 130. Cannot rule out seizure.   Awaiting EEG results.   Will continue to follow.   Assessment and plan discussed with with attending physician and they are in agreement.    Etta Quill PA-C [facility] [phone]  12/15/2013, 4:04 PM   I have seen and evaluated the patient. He reports at least three episodes similar to the one that borught him in. I have reviewed the above note and made appropriate changes. Left temporal slow activity on EEG. With recurrent spells of loss of awareness as well as post-ictal aphasia. 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 At this time, I will start keppra 500mg  BID. Awaiting MRI brain.   Roland Rack, MD [facility] [phone]  If 7pm- 7am, please page neurology on call at [phone].

## 2013-12-15 NOTE — Progress Notes (Signed)
EEG Completed; Results Pending  

## 2013-12-15 NOTE — Progress Notes (Addendum)
ANTIBIOTIC CONSULT NOTE - INITIAL  Pharmacy Consult for Vancomycin Indication: pneumonia  Allergies  Allergen Reactions  . Amoxicillin-Pot Clavulanate Other (See Comments)    Severe stomach pain.   . Quinapril Hcl     REACTION: back pain  . Valsartan     REACTION: back pain  . Varenicline Tartrate     REACTION: AMS, hallucinations, night mares    Patient Measurements: Height: 5\' 8"  (172.7 cm) Weight: 190 lb (86.183 kg) IBW/kg (Calculated) : 68.4  Labs:  Recent Labs  12/14/13 1435 12/15/13 0635  WBC 15.4* 14.0*  HGB 15.9 14.7  PLT 148* 141*  CREATININE 0.72 0.70   Microbiology: Recent Results (from the past 720 hour(s))  URINE CULTURE     Status: None   Collection Time    11/23/13  2:32 PM      Result Value Ref Range Status   Specimen Description URINE, CLEAN CATCH   Final   Special Requests NONE   Final   Culture  Setup Time     Final   Value: 11/23/2013 14:54     Performed at [place]     Final   Value: NO GROWTH     Performed at [facility]   Culture     Final   Value: NO GROWTH     Performed at [facility]   Report Status 11/24/2013 FINAL   Final  CULTURE, BLOOD (ROUTINE X 2)     Status: None   Collection Time    11/28/13  6:05 AM      Result Value Ref Range Status   Specimen Description BLOOD FOREARM RIGHT   Final   Special Requests BOTTLES DRAWN AEROBIC AND ANAEROBIC 5CC   Final   Culture  Setup Time     Final   Value: 11/28/2013 10:32     Performed at [facility]   Culture     Final   Value: MICROCOCCUS SPECIES     Note: Standardized susceptibility testing for this organism is not available.     Note: Gram Stain Report Called to,Read Back By and Verified With: JENNIFER B@12 :09PM ON 11/29/13 BY DANTS     Performed at [facility]   Report Status 11/30/2013 FINAL   Final  CULTURE, BLOOD (ROUTINE X 2)     Status: None   Collection Time    11/28/13  6:20 AM      Result Value Ref Range  Status   Specimen Description BLOOD HAND RIGHT   Final   Special Requests BOTTLES DRAWN AEROBIC AND ANAEROBIC 5CC   Final   Culture  Setup Time     Final   Value: 11/28/2013 10:32     Performed at [facility]   Culture     Final   Value: NO GROWTH 5 DAYS     Performed at [facility]   Report Status 12/04/2013 FINAL   Final  URINE CULTURE     Status: None   Collection Time    11/28/13  6:29 AM      Result Value Ref Range Status   Specimen Description URINE, RANDOM   Final   Special Requests NONE   Final   Culture  Setup Time     Final   Value: 11/28/2013 07:04     Performed at [place]     Final   Value: 80,000 COLONIES/ML     Performed  at [facility]     Final   Value: STAPHYLOCOCCUS SPECIES (COAGULASE NEGATIVE)     Note: RIFAMPIN AND GENTAMICIN SHOULD NOT BE USED AS SINGLE DRUGS FOR TREATMENT OF STAPH INFECTIONS.     Performed at [facility]   Report Status 11/30/2013 FINAL   Final   Organism ID, Bacteria STAPHYLOCOCCUS SPECIES (COAGULASE NEGATIVE)   Final  MRSA PCR SCREENING     Status: None   Collection Time    11/28/13  9:24 AM      Result Value Ref Range Status   MRSA by PCR NEGATIVE  NEGATIVE Final   Comment:            The GeneXpert MRSA Assay (FDA     approved for NASAL specimens     only), is one component of a     comprehensive MRSA colonization     surveillance program. It is not     intended to diagnose MRSA     infection nor to guide or     monitor treatment for     MRSA infections.  CULTURE, BLOOD (ROUTINE X 2)     Status: None   Collection Time    12/01/13 11:40 AM      Result Value Ref Range Status   Specimen Description BLOOD RIGHT ARM   Final   Special Requests BOTTLES DRAWN AEROBIC AND ANAEROBIC 10CC   Final   Culture  Setup Time     Final   Value: 12/01/2013 16:42     Performed at [facility]   Culture     Final   Value: NO GROWTH 5 DAYS     Performed at [facility]   Report Status 12/07/2013 FINAL   Final  CULTURE, BLOOD (ROUTINE X 2)     Status: None   Collection Time    12/01/13 11:52 AM      Result Value Ref Range Status   Specimen Description BLOOD RIGHT HAND   Final   Special Requests BOTTLES DRAWN AEROBIC ONLY 8CC   Final   Culture  Setup Time     Final   Value: 12/01/2013 16:42     Performed at [facility]   Culture     Final   Value: NO GROWTH 5 DAYS     Performed at [facility]   Report Status 12/07/2013 FINAL   Final  MRSA PCR SCREENING     Status: None   Collection Time    12/02/13 10:26 AM      Result Value Ref Range Status   MRSA by PCR NEGATIVE  NEGATIVE Final   Comment:            The GeneXpert MRSA Assay (FDA     approved for NASAL specimens     only), is one component of a     comprehensive MRSA colonization     surveillance program. It is not     intended to diagnose MRSA     infection nor to guide or     monitor treatment for     MRSA infections.  CULTURE, BLOOD (ROUTINE X 2)     Status: None   Collection Time    12/14/13  3:03 PM      Result Value Ref Range Status   Specimen Description BLOOD HAND LEFT   Final   Special Requests BOTTLES DRAWN AEROBIC AND ANAEROBIC 5CC   Final   Culture  Setup Time     Final   Value: 12/14/2013 19:29     Performed at [facility]   Culture     Final   Value:        BLOOD CULTURE RECEIVED NO GROWTH TO DATE CULTURE WILL BE HELD FOR 5 DAYS BEFORE ISSUING A FINAL NEGATIVE REPORT     Performed at [facility]   Report Status PENDING   Incomplete  CULTURE, BLOOD (ROUTINE X 2)     Status: None   Collection Time    12/14/13  3:03 PM      Result Value Ref Range Status   Specimen Description BLOOD HAND RIGHT   Final   Special Requests BOTTLES DRAWN AEROBIC AND ANAEROBIC 5CCBLUE 1CCRED   Final   Culture  Setup Time     Final   Value: 12/14/2013 19:29     Performed at [facility]   Culture     Final   Value:        BLOOD CULTURE  RECEIVED NO GROWTH TO DATE CULTURE WILL BE HELD FOR 5 DAYS BEFORE ISSUING A FINAL NEGATIVE REPORT     Performed at [facility]   Report Status PENDING   Incomplete    Medical History: Past Medical History  Diagnosis Date  . HTN (hypertension)   . HLD (hyperlipidemia)   . Pulmonary nodule, right     lower lobe  . COPD (chronic obstructive pulmonary disease)   . Rotator cuff injury     right  . Frozen shoulder   . Shoulder pain   . Hyperkalemia   . Dermatitis seborrheica   . Other diseases of lung, not elsewhere classified   . Kidney stone   . Allergic rhinitis   . ED (erectile dysfunction)   . Diabetes mellitus type II   . Asthma   . Tobacco abuse   . Stroke     pt states "last year"    Assessment: 73 year old male who presented to the ED with confusion, agitation, and slurring of speech.  Head CT negative for any acute findings.  He was recently discharged 12/08/13 after being admitted for ARF.  On this admission, pharmacy asked to begin broad spectrum antibiotics for possible HCAP.  Goal of Therapy:  Vancomycin trough level 15-20 mcg/ml  Plan:  1) Vancomycin 1250 mg iv Q 12 hours 2) Follow up cultures, Scr, fever trend, progress  Thank you. Anette Guarneri, PharmD [phone]  Tad Moore 12/15/2013,10:47 AM

## 2013-12-15 NOTE — Evaluation (Signed)
Occupational Therapy Evaluation Patient Details Name: Jonathan [name] MRN: [id number] DOB: 10-28-1940 Today's Date: 12/15/2013 Time: 6948-5462 OT Time Calculation (min): 14 min  OT Assessment / Plan / Recommendation History of present illness pt presents with Confusion and speech difficulties.  MRI negative for acute infarct.  Pt with encephalopathy with work up underway.  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   Clinical Impression   Pt admitted with above. He demonstrates the below listed deficits and will benefit from continued OT to maximize safety and independence with BADLs.  Pt with confusion.   He requires min A overall with BADLs.  At this time, recommend 24 hour assist/supervision at discharge.  Unable to determine if this level of care is available at home as no family present.  May require SNF level rehab prior to return home.      OT Assessment  Patient needs continued OT Services    Follow Up Recommendations  SNF;Supervision/Assistance - 24 hour    Barriers to Discharge   uncertain.  Family not present  Equipment Recommendations  None recommended by OT    Recommendations for Other Services    Frequency  Min 2X/week    Precautions / Restrictions Precautions Precautions: Fall Restrictions Weight Bearing Restrictions: No   Pertinent Vitals/Pain     ADL  Eating/Feeding: Supervision/safety Where Assessed - Eating/Feeding: Chair Grooming: Wash/dry hands;Wash/dry face;Teeth care;Denture care;Supervision/safety Where Assessed - Grooming: Unsupported sitting Upper Body Bathing: Minimal assistance Where Assessed - Upper Body Bathing: Unsupported sitting Lower Body Bathing: Minimal assistance Where Assessed - Lower Body Bathing: Supported sit to stand Upper Body Dressing: Minimal assistance Where Assessed - Upper Body Dressing: Unsupported sitting Lower Body Dressing: Minimal assistance Where Assessed - Lower Body Dressing: Supported sit to stand Toilet Transfer: Minimal  assistance Toilet Transfer Method: Sit to stand;Stand pivot Science writer: Bedside commode Toileting - Clothing Manipulation and Hygiene: Minimal assistance Where Assessed - Best boy and Hygiene: Sit to stand from 3-in-1 or toilet Transfers/Ambulation Related to ADLs: Min A for safety ADL Comments: Pt able to don/doff socks with dyspnea 2/4.  Pt with limited participation as he spontaneously returned to supine    OT Diagnosis: Generalized weakness;Cognitive deficits  OT Problem List: Decreased strength;Decreased activity tolerance;Impaired balance (sitting and/or standing);Decreased cognition;Decreased safety awareness;Decreased knowledge of use of DME or AE OT Treatment Interventions: Self-care/ADL training;DME and/or AE instruction;Therapeutic activities;Cognitive remediation/compensation;Patient/family education;Balance training   OT Goals(Current goals can be found in the care plan section) Acute Rehab OT Goals Patient Stated Goal: Go home OT Goal Formulation: With patient Time For Goal Achievement: 12/22/13 Potential to Achieve Goals: Good ADL Goals Pt Will Perform Grooming: with supervision;standing Pt Will Perform Upper Body Bathing: with set-up;sitting Pt Will Perform Lower Body Bathing: with supervision;sit to/from stand Pt Will Perform Upper Body Dressing: with set-up;sitting Pt Will Perform Lower Body Dressing: with supervision;sit to/from stand Pt Will Transfer to Toilet: with supervision;ambulating;regular height toilet Pt Will Perform Toileting - Clothing Manipulation and hygiene: with supervision;sit to/from stand  Visit Information  Last OT Received On: 12/15/13 Assistance Needed: +1 History of Present Illness: pt presents with Confusion and speech difficulties.  MRI negative for acute infarct.  Pt with encephalopathy with work up underway.  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       Prior Functioning     Home Living Family/patient  expects to be discharged to:: Inpatient rehab Living Arrangements: Spouse/significant other Available Help at Discharge: Family Type of Home: Mobile home Home Access: Stairs  to enter Entrance Stairs-Number of Steps: 3 Entrance Stairs-Rails: Can reach both Home Layout: One level Home Equipment: Walker - 4 wheels Additional Comments: pt was on O2 after last couple hospitalizations.   Prior Function Level of Independence: Independent with assistive device(s) Comments: Uses cane at times. Reports one fall several weeks ago Communication Communication: No difficulties         Vision/Perception Vision - History Patient Visual Report: No change from baseline Vision - Assessment Vision Assessment: Vision not tested Additional Comments: Pt returned to supine before visual assessment could be completed Perception Perception: Within Functional Limits Praxis Praxis: Intact   Cognition  Cognition Arousal/Alertness: Lethargic Behavior During Therapy: Flat affect Overall Cognitive Status: Difficult to assess Area of Impairment: Attention;Safety/judgement;Awareness;Problem solving;Orientation Orientation Level: Situation Current Attention Level: Sustained Safety/Judgement: Decreased awareness of safety Problem Solving: Requires verbal cues;Requires tactile cues General Comments: Unable to confirm if info provided is accurate.  Pt somewhat impulsive Difficult to assess due to: Level of arousal    Extremity/Trunk Assessment Upper Extremity Assessment Upper Extremity Assessment: Generalized weakness Lower Extremity Assessment Lower Extremity Assessment: Defer to PT evaluation Cervical / Trunk Assessment Cervical / Trunk Assessment: Normal     Mobility Bed Mobility Overal bed mobility: Needs Assistance Bed Mobility: Supine to Sit;Sit to Supine Supine to sit: Min guard Sit to supine: Supervision General bed mobility comments: verbal cues to move to sitting  Transfers Overall  transfer level: Needs assistance Equipment used: None Transfers: Sit to/from Stand Sit to Stand: Min assist General transfer comment: assist for balance     Exercise     Balance Balance Overall balance assessment: Needs assistance Sitting-balance support: No upper extremity supported Sitting balance-Leahy Scale: Good Standing balance support: No upper extremity supported Standing balance-Leahy Scale: Fair Standing balance comment: pt requires MinA to maintain balance as pt with mild Bil sway.     End of Session OT - End of Session Equipment Utilized During Treatment: Oxygen Activity Tolerance: Patient limited by fatigue Patient left: in bed;with call bell/phone within reach;with bed alarm set Nurse Communication: Mobility status  GO Functional Limitation: Self care Self Care Current Status (Q1194): At least 20 percent but less than 40 percent impaired, limited or restricted Self Care Goal Status (R7408): At least 1 percent but less than 20 percent impaired, limited or restricted   Hendel Gatliff M 12/15/2013, 2:07 PM

## 2013-12-15 NOTE — Procedures (Signed)
History: 73 yo M with recurrent episodes of lost time with following confusion.   Sedation: None  Technique: This is a 17 channel routine scalp EEG performed at the bedside with bipolar and monopolar montages arranged in accordance to the international 10/20 system of electrode placement. One channel was dedicated to EKG recording.    Background: The background consists predominantly of alpha and beta activity. There is a well defined posterior dominant rhythm of 8 Hz that attenuates with eye opening. There is some focal delta and theta slowing most prominent at F7, T7.   Photic stimulation: Physiologic driving is not performed  EEG Abnormalities: 1) Left temporal slow activity.   Clinical Interpretation: This EEG demonstrates a non-specific region of dysfunction in the left temporal region. There was no seizure or seizure predisposition recorded on this study.   Roland Rack, MD [facility] [phone]  If 7pm- 7am, please page neurology on call at [phone].

## 2013-12-15 NOTE — Progress Notes (Signed)
Rehab Admissions Coordinator Note:  Patient was screened by Retta Diones for appropriateness for an Inpatient Acute Rehab Consult.  At this time, we are recommending [place]. It is very doubtful that we could get authorization from [place] given current diagnosis.  Jodell Cipro M 12/15/2013, 1:10 PM  I can be reached at [phone].

## 2013-12-15 NOTE — Progress Notes (Signed)
Tried to start another iv site to run the patient's iv medications, patient refused.

## 2013-12-15 NOTE — Evaluation (Addendum)
Clinical/Bedside Swallow Evaluation Patient Details  Name: Jonathan [name] MRN: [id number] Date of Birth: 07-16-1941  Today's Date: 12/15/2013 Time: 1006-1055 SLP Time Calculation (min): 49 min  Past Medical History:  Past Medical History  Diagnosis Date  . HTN (hypertension)   . HLD (hyperlipidemia)   . Pulmonary nodule, right     lower lobe  . COPD (chronic obstructive pulmonary disease)   . Rotator cuff injury     right  . Frozen shoulder   . Shoulder pain   . Hyperkalemia   . Dermatitis seborrheica   . Other diseases of lung, not elsewhere classified   . Kidney stone   . Allergic rhinitis   . ED (erectile dysfunction)   . Diabetes mellitus type II   . Asthma   . Tobacco abuse   . Stroke     pt states "last year"   Past Surgical History:  Past Surgical History  Procedure Laterality Date  . Cervical discectomy  1998  . Ett  11/1994  . Tm repair    . Tee without cardioversion  12/03/2011    Procedure: TRANSESOPHAGEAL ECHOCARDIOGRAM (TEE);  Surgeon: Lelon Perla, MD;  Location: [facility] ENDOSCOPY;  Service: Cardiovascular;  Laterality: N/A;   HPI:  73 y.o. male who presents to the ED, with confusion, and agitation and slurring of his speech.  He was last admitted to the hospital from 11/28/13  Until  12/08/2013  due to Acute on Chronic respiratory failure and had been sent home on 2 liters NCO2.  In the ED, he was found to have an ABG of pH =7.5/ pCO2= 34 / pO2=127 / HCO3=28.5 / and O2 sat 99% on 2 liters NCO2.    Pt recently put on oxygen at home.   A CT scan of the head was performed which was negative for acute findings, and he was referred for medical admission.   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  Pt has h/o smoking dc'd a few weeks ago. He required intubation recently during hospital admission.   Pt found to have pna, possibly due to aspiration.  Swallow evaluation ordered.     Assessment / Plan / Recommendation Clinical Impression  Pt without focal  CN deficits that impact swallowing ability however he demonstrates swallow/respiration discoordination- inhaling with bolus in mouth.  No s/s of aspiration with po observed (thin, nectar, applesauce, cracker) however suspect episodic aspiration due to respiratory deficits.  Educated significant other and daughter to precautions to mitigate asp risk - not prevent it.  Further explained food/drink items that are less caustic to lungs when asp occurs.    Do not recommend MBS as do not anticipate will change outcomes and pt demonstrated quick frustration stating "leave me alone" therefore doubt he would comply with stringent modifications.   Rec soft/thin diet with precautions.      Aspiration Risk    Moderate and chronic   Diet Recommendation Dysphagia 3 (Mechanical Soft);Thin liquid   Liquid Administration via: Cup;Straw Medication Administration: Whole meds with puree (start and follow with water) Supervision: Patient able to self feed;Intermittent supervision to cue for compensatory strategies Compensations: Slow rate;Small sips/bites (start meal with liquids) Postural Changes and/or Swallow Maneuvers: Seated upright 90 degrees;Upright 30-60 min after meal    Other  Recommendations Oral Care Recommendations: Oral care BID   Follow Up Recommendations  None    Frequency and Duration min 1 x/week  1 week   Pertinent Vitals/Pain Afebrile, rhonchi  Swallow Study Prior Functional Status   see hhx    General Date of Onset: 12/15/13 HPI: 73 y.o. male who presents to the ED, with confusion, and agitation and slurring of his speech.  He was last admitted to the hospital from 11/28/13  Until  12/08/2013  due to Acute on Chronic respiratory failure and had been sent home on 2 liters NCO2.  In the ED, he was found to have an ABG of pH =7.5/ pCO2= 34 / pO2=127 / HCO3=28.5 / and O2 sat 99% on 2 liters NCO2.    Pt recently put on oxygen at home.   A CT scan of the head was performed which was  negative for acute findings, and he was referred for medical admission.   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  Pt has h/o smoking dc'd a few weeks ago. He required intubation recently during hospital admission.   Pt found to have pna, possibly due to aspiration.  Swallow evaluation ordered.   Type of Study: Bedside swallow evaluation Diet Prior to this Study: NPO Temperature Spikes Noted: No Respiratory Status: Nasal cannula History of Recent Intubation: No Behavior/Cognition: Alert;Cooperative;Pleasant mood Oral Cavity - Dentition: Adequate natural dentition Self-Feeding Abilities: Able to feed self Patient Positioning: Upright in bed Baseline Vocal Quality: Clear Volitional Cough: Weak Volitional Swallow: Able to elicit    Oral/Motor/Sensory Function Overall Oral Motor/Sensory Function: Appears within functional limits for tasks assessed   Ice Chips Ice chips: Not tested   Thin Liquid Thin Liquid: Impaired Presentation: Self Fed;Cup;Straw Pharyngeal  Phase Impairments: Multiple swallows Other Comments: swallow/respiration discoordination, pt inhaling with bolus in mouth, suspect piecemeal deglutition multiple swallows    Nectar Thick Nectar Thick Liquid: Impaired Presentation: Cup;Straw;Self Fed Pharyngeal Phase Impairments: Multiple swallows Other Comments: swallow/respiration discoordination, pt inhaling with bolus in mouth, suspect piecemeal deglutition   Honey Thick Honey Thick Liquid: Not tested   Puree Puree: Impaired Other Comments: swallow/respiration discoordination, pt inhaling with bolus in mouth   Solid   GO Functional Assessment Tool Used: clinical judgement Functional Limitations: Swallowing Swallow Current Status (Z6629): At least 40 percent but less than 60 percent impaired, limited or restricted Swallow Goal Status [phone]): At least 20 percent but less than 40 percent impaired, limited or restricted  Solid: Impaired Other Comments:  swallow/respiration discoordination, pt inhaling with bolus in mouth, suspect piecemeal deglutition       Luanna Salk, MS [facility] SLP [phone]    Given pt without neurological event and has support level needed at home, SLP screened pt's speech, language abilities.  Will follow briefly for swallow function only.  Thanks.

## 2013-12-16 ENCOUNTER — Inpatient Hospital Stay (HOSPITAL_COMMUNITY): Payer: Medicare Other

## 2013-12-16 ENCOUNTER — Inpatient Hospital Stay: Payer: Medicare Other | Admitting: Adult Health

## 2013-12-16 DIAGNOSIS — R410 Disorientation, unspecified: Secondary | ICD-10-CM | POA: Diagnosis present

## 2013-12-16 LAB — GLUCOSE, CAPILLARY
Glucose-Capillary: 143 mg/dL — ABNORMAL HIGH (ref 70–99)
Glucose-Capillary: 158 mg/dL — ABNORMAL HIGH (ref 70–99)
Glucose-Capillary: 176 mg/dL — ABNORMAL HIGH (ref 70–99)
Glucose-Capillary: 193 mg/dL — ABNORMAL HIGH (ref 70–99)
Glucose-Capillary: 216 mg/dL — ABNORMAL HIGH (ref 70–99)
Glucose-Capillary: 218 mg/dL — ABNORMAL HIGH (ref 70–99)

## 2013-12-16 LAB — CBC
HCT: 39.6 % (ref 39.0–52.0)
Hemoglobin: 13.5 g/dL (ref 13.0–17.0)
MCH: 29.6 pg (ref 26.0–34.0)
MCHC: 34.1 g/dL (ref 30.0–36.0)
MCV: 86.8 fL (ref 78.0–100.0)
Platelets: 131 10*3/uL — ABNORMAL LOW (ref 150–400)
RBC: 4.56 MIL/uL (ref 4.22–5.81)
RDW: 14 % (ref 11.5–15.5)
WBC: 9.1 10*3/uL (ref 4.0–10.5)

## 2013-12-16 LAB — BASIC METABOLIC PANEL
BUN: 13 mg/dL (ref 6–23)
CO2: 25 mEq/L (ref 19–32)
Calcium: 8.3 mg/dL — ABNORMAL LOW (ref 8.4–10.5)
Chloride: 100 mEq/L (ref 96–112)
Creatinine, Ser: 0.8 mg/dL (ref 0.50–1.35)
GFR calc Af Amer: >90 mL/min (ref >90)
GFR calc non Af Amer: 87 mL/min — ABNORMAL LOW (ref >90)
Glucose, Bld: 206 mg/dL — ABNORMAL HIGH (ref 70–99)
Potassium: 3.8 mEq/L (ref 3.7–5.3)
Sodium: 137 mEq/L (ref 137–147)

## 2013-12-16 LAB — MAGNESIUM: Magnesium: 1.7 mg/dL (ref 1.5–2.5)

## 2013-12-16 MED ORDER — INSULIN ASPART 100 UNIT/ML ~~LOC~~ SOLN
0.0000 [IU] | Freq: Three times a day (TID) | SUBCUTANEOUS | Status: DC
  Administered 2013-12-16: 2 [IU] via SUBCUTANEOUS
  Administered 2013-12-17 (×2): 3 [IU] via SUBCUTANEOUS
  Administered 2013-12-18: 1 [IU] via SUBCUTANEOUS
  Administered 2013-12-18: 3 [IU] via SUBCUTANEOUS
  Administered 2013-12-18: 2 [IU] via SUBCUTANEOUS
  Administered 2013-12-19: 3 [IU] via SUBCUTANEOUS
  Administered 2013-12-19: 1 [IU] via SUBCUTANEOUS
  Administered 2013-12-19: 3 [IU] via SUBCUTANEOUS

## 2013-12-16 MED ORDER — INSULIN GLARGINE 100 UNIT/ML ~~LOC~~ SOLN
25.0000 [IU] | Freq: Every day | SUBCUTANEOUS | Status: DC
  Administered 2013-12-16 – 2013-12-17 (×2): 25 [IU] via SUBCUTANEOUS
  Filled 2013-12-16 (×3): qty 0.25

## 2013-12-16 MED ORDER — LORAZEPAM 2 MG/ML IJ SOLN
1.0000 mg | Freq: Once | INTRAMUSCULAR | Status: AC
  Administered 2013-12-16: 1 mg via INTRAVENOUS
  Filled 2013-12-16: qty 1

## 2013-12-16 NOTE — Progress Notes (Addendum)
Pt transported off to MRI with IV, O2 and continuous pulse ox. Pt stated "If am going to go into that coffin and surely give me something". Pt given his ordered PRN ativan dose prior to leaving the floor. Delia Heady RN BSN

## 2013-12-16 NOTE — Progress Notes (Signed)
OT Cancellation Note  Patient Details Name: Jonathan [name] MRN: [id number] DOB: Dec 09, 1940   Cancelled Treatment:    Reason Eval/Treat Not Completed: Pt states he's already been up today.  Explained to pt the benefit for working with OT, and that it is recommended that he be up more than once a day and risks associated with staying in bed, but he continued to refuse.  Will try back Monday.  Based on eval yesterday, continue to recommend SNF level rehab at discharge.   Lucille Passy M [phone] 12/16/2013, 12:45 PM

## 2013-12-16 NOTE — Progress Notes (Signed)
Inpatient Diabetes Program Recommendations  AACE/ADA: New Consensus Statement on Inpatient Glycemic Control (2013)  Target Ranges:  Prepandial:   less than 140 mg/dL      Peak postprandial:   less than 180 mg/dL (1-2 hours)      Critically ill patients:  140 - 180 mg/dL  Results for SHAWAN, CORELLA (MRN [id number]) as of 12/16/2013 13:44  Ref. Range 12/15/2013 16:32 12/15/2013 20:03 12/16/2013 00:49 12/16/2013 10:10 12/16/2013 11:33  Glucose-Capillary Latest Range: 70-99 mg/dL 144 (H) 171 (H) 216 (H) 218 (H) 176 (H)   Inpatient Diabetes Program Recommendations Insulin - Basal: Increase Lantus to 30 units  (home dose is 40 units per med rec) Correction (SSI): change to TID if patient is eating  Thank you  Raoul Pitch BSN, RN,CDE Inpatient Diabetes Coordinator [phone] (team pager)

## 2013-12-16 NOTE — Progress Notes (Signed)
Physical Therapy Treatment Patient Details Name: DAEVION NAVARETTE MRN: [id number] DOB: 14-Apr-1941 Today's Date: 12/16/2013 Time: 0254-2706 PT Time Calculation (min): 16 min  PT Assessment / Plan / Recommendation  History of Present Illness pt presents with Confusion and speech difficulties.  MRI negative for acute infarct.  Pt with encephalopathy with work up underway.  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   PT Comments   Patient more alert and able to ambulate this session. Still with some impulsivity and safety concerns but otherwise progressing well.   Follow Up Recommendations  CIR     Does the patient have the potential to tolerate intense rehabilitation     Barriers to Discharge        Equipment Recommendations  Rolling walker with 5" wheels    Recommendations for Other Services    Frequency Min 4X/week   Progress towards PT Goals Progress towards PT goals: Progressing toward goals  Plan Current plan remains appropriate    Precautions / Restrictions Precautions Precautions: Fall   Pertinent Vitals/Pain no apparent distress     Mobility  Bed Mobility Overal bed mobility: Modified Independent Transfers Sit to Stand: Supervision General transfer comment: supervision for safety Ambulation/Gait Ambulation/Gait assistance: Min guard Ambulation Distance (Feet): 300 Feet Assistive device:  (IVPOLE) General Gait Details: Patient with good control. initially impulsive but with cueing was able to ambulate much safer with more control     Exercises     PT Diagnosis:    PT Problem List:   PT Treatment Interventions:     PT Goals (current goals can now be found in the care plan section)    Visit Information  Last PT Received On: 12/16/13 Assistance Needed: +1 History of Present Illness: pt presents with Confusion and speech difficulties.  MRI negative for acute infarct.  Pt with encephalopathy with work up underway.  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    Subjective Data      Cognition  Cognition Arousal/Alertness: Awake/alert Behavior During Therapy: WFL for tasks assessed/performed Area of Impairment: Safety/judgement Safety/Judgement: Decreased awareness of safety General Comments: Patient continues to be somewhat impulsive    Balance     End of Session PT - End of Session Equipment Utilized During Treatment: Oxygen;Gait belt Activity Tolerance: Patient tolerated treatment well Patient left: in chair;with call bell/phone within reach Nurse Communication: Mobility status   GP     Jacqualyn Posey 12/16/2013, 11:32 AM 12/16/2013 Jacqualyn Posey PTA [phone] pager [phone] office

## 2013-12-16 NOTE — Progress Notes (Signed)
Speech Language Pathology Treatment: Dysphagia  Patient Details Name: Jonathan [name] MRN: [id number] DOB: [birth year] Today's Date: 12/16/2013 Time: 6301-6010 SLP Time Calculation (min): 20 min  Assessment / Plan / Recommendation Clinical Impression  Patient observed with thin liquids, which he self-managed from cup. SpO2 remained at 98% prior to and during PO intake. Patient did not exhibit any overt s/s aspiration or difficulty. He pleasantly declined solid texture PO's stating "I just finished breakfast". He denied any difficulty with PO intake at breakfast meal. Recommend continuing with Dysphagia 3, Thin liquids.   HPI HPI: 73 y.o. male who presents to the ED, with confusion, and agitation and slurring of his speech.  He was last admitted to the hospital from 11/28/13  Until  12/08/2013  due to Acute on Chronic respiratory failure and had been sent home on 2 liters NCO2.  In the ED, he was found to have an ABG of pH =7.5/ pCO2= 34 / pO2=127 / HCO3=28.5 / and O2 sat 99% on 2 liters NCO2.    Pt recently put on oxygen at home.   A CT scan of the head was performed which was negative for acute findings, and he was referred for medical admission.   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  Pt has h/o smoking dc'd a few weeks ago. He required intubation recently during hospital admission.   Pt found to have pna, possibly due to aspiration.  Swallow evaluation ordered.     Pertinent Vitals   SLP Plan  Continue with current plan of care    Recommendations Diet recommendations: Dysphagia 3 (mechanical soft);Thin liquid Liquids provided via: Cup Medication Administration: Whole meds with puree Supervision: Patient able to self feed;Intermittent supervision to cue for compensatory strategies Compensations: Slow rate;Small sips/bites (rest breaks if fatigued or SpO2 drop) Postural Changes and/or Swallow Maneuvers: Seated upright 90 degrees;Upright 30-60 min after meal              Oral Care Recommendations: Oral care BID Follow up Recommendations: None Plan: Continue with current plan of care    GO Swallow Current Status (X3235): At least 40 percent but less than 60 percent impaired, limited or restricted Swallow Goal Status [phone]): At least 20 percent but less than 40 percent impaired, limited or restricted   [name] 12/16/2013, 5:28 PM   Sonia Baller, MA, [facility] Speech-Language Pathologist

## 2013-12-16 NOTE — Clinical Social Work Note (Signed)
Per chart review, pt is oriented only to place. CSW called pt's daughter, Sula Soda ([phone]) regarding pt's discharge disposition. Sula Soda informed CSW that CSW would need to speak with pt's girlfriend, Kyra Searles [phone]). CSW has called and left a voice message with Faith requesting she either call Unit CSW or weekend CSW. CSW has left handoff for weekend CSW requesting a follow-up with discharge disposition planning.  Pati Gallo, [facility] Social Worker [phone]

## 2013-12-16 NOTE — Progress Notes (Addendum)
[facility] PROGRESS NOTE  Jonathan [name] [id number] DOB: 1941-06-19 DOA: 12/14/2013 PCP: Loura Pardon, MD  Assessment/Plan: Acute encephalopathy/dysarthria  -Likely due to a combination of aspiration pneumonitis and possible seizure -Improving, but not back to baseline  -MRI brain negative for acute infarct  -Concerned about aspiration pneumonitis/HCAP  -11/28/2013 echocardiogram EF 55-60%  -Ammonia 17  -11/28/2013 TSH 0.447  -Urinalysis negative for pyuria  -Urine drug screen negative  HCAP/Aspiration pneumonia  -Blood cultures x2 sets  -speech therapy eval for swallowing  -empiric vancomycin and zosyn pending culture data  -Patient failed RN swallow evaluation 12/14/2013  - evaluated by speech therapy --> dysphagia 3 diet  Abnormal EEG  -Slow left temporal activity  -Appreciate neurology input  -Continue Keppra per neurology recommendations  -repeat MRI brain COPD  -50-pack-year history  -Stable  -On 2 L nasal cannula at home  Diabetes mellitus type 2, uncontrolled  -11/28/2013 hemoglobin A1c 8.4  -Increase Lantus to 25 units at bedtime  -change ISS to qac/hs Hyperlipidemia  -Continue statin  Hypertension  -Hold amlodipine as the patient's blood pressure is soft  Chronic respiratory failure  -The patient is maintained on 2 L nasal cannula at home  Hyponatremia  -Likely volume depletion  -IVF-->improving Family Communication: Daughter updated on phone  Disposition Plan: Home when medically stable  Antibiotics:  Vancomycin 12/15/13>>>  Zosyn 12/15/13>>>          Procedures/Studies: Dg Chest 2 View  12/14/2013   CLINICAL DATA:  ALTERED MENTAL STATUS  EXAM: CHEST  2 VIEW  COMPARISON:  DG CHEST 1V PORT dated 12/07/2013  FINDINGS: Low lung volumes. There is increased density projects in the lung bases right greater than left. Cardiac silhouettes within normal limits. Calcified lymph nodes project within the infrahilar region on the left. The osseous  structures demonstrate no acute abnormalities.  IMPRESSION: Low lung volumes which accentuates lung findings. There are areas of increased density within the lung bases differential considerations are atelectasis versus infiltrates. Right greater than left.   Electronically Signed   By: Margaree Mackintosh M.D.   On: 12/14/2013 16:07   Dg Chest 2 View  12/02/2013   CLINICAL DATA:  Shortness of Breath  EXAM: CHEST  2 VIEW  COMPARISON:  Chest radiograph November 28, 2013 and chest CT November 28, 2013  FINDINGS: There is a degree of underlying emphysematous change. There is no edema or consolidation. Heart is borderline enlarged with normal pulmonary vascularity. No adenopathy. No bone lesions.  IMPRESSION: No edema or consolidation. Evidence of a degree of underlying emphysematous change. Mild cardiac prominence.   Electronically Signed   By: Lowella Grip M.D.   On: 12/02/2013 08:20   Dg Chest 2 View  11/23/2013   CLINICAL DATA:  Shortness of breath and cough  EXAM: CHEST  2 VIEW  COMPARISON:  12/02/2011  FINDINGS: Mild interstitial coarsening which appears chronic. No evidence of consolidation in the lateral projection. No edema, effusion, or pneumothorax. Normal heart size.  IMPRESSION: Stable exam.  No edema or consolidation.   Electronically Signed   By: Jorje Guild M.D.   On: 11/23/2013 14:09   Ct Head Wo Contrast  12/14/2013   CLINICAL DATA:  Altered mental status  EXAM: CT HEAD WITHOUT CONTRAST  TECHNIQUE: Contiguous axial images were obtained from the base of the skull through the vertex without intravenous contrast. Study was obtained within 24 hr of patient's arrival at the emergency department.  COMPARISON:  Brain CT November 28, 2013 and brain MRI November 29, 2013.  FINDINGS: Mild generalized atrophy remains stable. There is no demonstrable mass, hemorrhage, extra-axial fluid collection, or midline shift. Patchy small vessel disease in the centra semiovale bilaterally is stable. No new gray-white compartment  lesions are identified. There is no acute appearing infarct on this study.  The bony calvarium appears intact. Mastoids on the left are clear. Mastoids on the right are partially opacified, stable. There is patchy ethmoid sinus disease bilaterally. Atrophy of the left parotid gland with fatty replacement remains. The visualized right parotid gland appears somewhat hypertrophied but otherwise unremarkable in appearance, unchanged from recent prior study.  IMPRESSION: Atrophy with small vessel disease. No intracranial mass, hemorrhage, or acute appearing infarct.  Atrophy of the left parotid gland with relative hypertrophy of the right parotid gland appears stable. There is right mastoid as well as bilateral ethmoid sinus disease.   Electronically Signed   By: Lowella Grip M.D.   On: 12/14/2013 16:07   Ct Head Wo Contrast  11/28/2013   CLINICAL DATA:  Fall.  EXAM: CT HEAD WITHOUT CONTRAST  TECHNIQUE: Contiguous axial images were obtained from the base of the skull through the vertex without intravenous contrast.  COMPARISON:  MR HEAD W/O CM dated 12/01/2011  FINDINGS: The ventricles and sulci are normal for age. No intraparenchymal hemorrhage, mass effect nor midline shift. Patchy supratentorial white matter hypodensities are within normal range for patient's age and though non-specific suggest sequelae of chronic small vessel ischemic disease. No acute large vascular territory infarcts.  No abnormal extra-axial fluid collections. Basal cisterns are patent. Moderate calcific atherosclerosis of the carotid siphons.  No skull fracture. Small left maxillary mucosal retention cyst, mild paranasal sinus mucosal thickening without air-fluid levels. Mastoid air cells are well aerated. Moderate temporomandibular osteoarthrosis. . The included ocular globes and orbital contents are non-suspicious. Marked asymmetry of the parotid glands, the right appears enlarged in the left appears very fatty replaced.  IMPRESSION: No  acute intracranial process.  Moderate white matter changes suggest chronic small vessel ischemic disease.  Asymmetric parotid glands, the right appears mildly edematous and the left appears fatty replaced versus resected with flap, recommend clinical correlation and correlation with surgical history.   Electronically Signed   By: Elon Alas   On: 11/28/2013 05:40   Ct Angio Chest Pe W/cm &/or Wo Cm  11/28/2013   CLINICAL DATA:  Shortness of breath. Found down. Evaluate for potential pulmonary embolism.  EXAM: CT ANGIOGRAPHY CHEST WITH CONTRAST  TECHNIQUE: Multidetector CT imaging of the chest was performed using the standard protocol during bolus administration of intravenous contrast. Multiplanar CT image reconstructions and MIPs were obtained to evaluate the vascular anatomy.  CONTRAST:  158mL OMNIPAQUE IOHEXOL 350 MG/ML SOLN  COMPARISON:  Chest CT 05/07/2010.  FINDINGS: Mediastinum: Study is limited by patient respiratory motion. With these limitations in mind, there is no central, lobar or segmental sized pulmonary embolism. Accurate assessment for smaller subsegmental sized pulmonary embolism is not possible secondary to the limitations of this examination. Heart size is normal. There is no significant pericardial fluid, thickening or pericardial calcification. There is atherosclerosis of the thoracic aorta, the great vessels of the mediastinum and the coronary arteries, including calcified atherosclerotic plaque in the left main, left anterior descending, left circumflex right coronary arteries. No pathologically enlarged mediastinal or hilar lymph nodes. Esophagus is unremarkable in appearance.  Lungs/Pleura: Moderate centrilobular emphysema, most pronounced in the lung apices. No acute consolidative airspace disease. No pleural effusions. 5 mm right lower lobe nodule (image  89 of series 6), unchanged compared to prior study from 05/07/2010; this can be considered benign and does not require image  followup). No other larger suspicious appearing pulmonary nodules or masses are identified on today's examination.  Upper Abdomen: Unremarkable.  Musculoskeletal: There are no aggressive appearing lytic or blastic lesions noted in the visualized portions of the skeleton.  Review of the MIP images confirms the above findings.  IMPRESSION: 1. Limited examination secondary to patient motion demonstrated no evidence of clinically significant central, lobar or segmental sized pulmonary embolism. Smaller subsegmental sized pulmonary embolism cannot be entirely excluded. 2. No acute findings in the thorax to account for the patient's symptoms. 3. Atherosclerosis, including left main and 3 vessel coronary artery disease. Assessment for potential risk factor modification, dietary therapy or pharmacologic therapy may be warranted, if clinically indicated. 4. Moderate centrilobular emphysema. 5. Additional incidental findings, as above.   Electronically Signed   By: Vinnie Langton M.D.   On: 11/28/2013 14:15   Mr Brain Wo Contrast  12/14/2013   CLINICAL DATA:  Aphasia. The examination had to be discontinued prior to completion due to patient combativeness.  EXAM: MRI HEAD WITHOUT CONTRAST  TECHNIQUE: Multiplanar, multiecho pulse sequences of the brain and surrounding structures were obtained without intravenous contrast.  COMPARISON:  CT HEAD W/O CM dated 12/14/2013; MR HEAD W/O CM dated 11/29/2013  FINDINGS: The diffusion-weighted images demonstrate no evidence for acute or subacute infarction. Diffuse white matter changes are again noted.  IMPRESSION: 1. No acute abnormality. 2. The study was discontinued after the diffusion-weighted imaging as the patient was combative and noncooperative. 3. Diffuse white matter changes are again noted.   Electronically Signed   By: Lawrence Santiago M.D.   On: 12/14/2013 21:04   Mr Brain Wo Contrast  11/29/2013   CLINICAL DATA:  Confusion.  Hypertension.  COPD.  EXAM: MRI HEAD WITHOUT  CONTRAST  TECHNIQUE: Multiplanar, multiecho pulse sequences of the brain and surrounding structures were obtained without intravenous contrast.  COMPARISON:  CT HEAD W/O CM dated 11/28/2013; MR HEAD W/O CM dated 12/01/2011; MR MRA HEAD W/O CM dated 12/01/2011  FINDINGS: No evidence for acute infarction, hemorrhage, mass lesion, hydrocephalus, or extra-axial fluid. Advanced atrophy. Extensive chronic microvascular ischemic change throughout the periventricular and subcortical white matter. No foci of chronic hemorrhage. Pituitary, pineal, and cerebellar tonsils unremarkable. No upper cervical lesions. Visualized calvarium, skull base, and upper cervical osseous structures unremarkable. Scalp and extracranial soft tissues, orbits, and sinuses show no acute process. Chronic right mastoid fluid likely effusion was demonstrated in 2013.  Left parotid remains atrophic as compared to the right. This appearance was present in 2013 and is stable.  IMPRESSION: Advanced atrophy with extensive small vessel disease. Cyst similar to priors. No acute intracranial findings.  Atrophic left parotid gland.   Electronically Signed   By: Rolla Flatten M.D.   On: 11/29/2013 15:49   US Renal  12/03/2013   CLINICAL DATA:  Assess for perinephric abscess.  EXAM: RENAL/URINARY TRACT ULTRASOUND COMPLETE  COMPARISON:  07/09/2009  FINDINGS: Right Kidney:  Length: 12.5 cm. Echogenicity within normal limits. No mass or hydronephrosis visualized.  Left Kidney:  Length: 3.8 cm. Echogenicity within normal limits. No mass or hydronephrosis visualized. Cyst within the inferior pole is again noted measuring 2.8 x 3.2 x 3.4 cm. This contains some diffuse low level internal echoes. Increased through transmission is identified.  Bladder:  Appears normal for degree of bladder distention.  IMPRESSION: 1. No evidence for renal abscess. 2.  Stable cyst within the inferior pole of the left kidney.   Electronically Signed   By: Kerby Moors M.D.   On: 12/03/2013  11:23   Dg Chest Port 1 View  12/07/2013   CLINICAL DATA Hypoxia  EXAM PORTABLE CHEST - 1 VIEW  COMPARISON December 05, 2013  FINDINGS There is underlying emphysematous change. There is no edema or consolidation. The heart is upper normal in size. The pulmonary vascularity reflects underlying emphysema. No adenopathy. There is arthropathy in both shoulders.  IMPRESSION Underlying emphysematous change.  No edema or consolidation.  SIGNATURE  Electronically Signed   By: Lowella Grip M.D.   On: 12/07/2013 07:15   Dg Chest Port 1 View  12/05/2013   CLINICAL DATA:  Shortness of breath.  EXAM: PORTABLE CHEST - 1 VIEW  COMPARISON:  DG CHEST 1V PORT dated 12/04/2013; DG CHEST 2 VIEW dated 12/02/2013; CT ANGIO CHEST W/CM &/OR WO/CM dated 11/28/2013; DG CHEST 2 VIEW dated 11/23/2013  FINDINGS: The mediastinum and hilar structures are normal. Stable cardiomegaly. Normal pulmonary vascularity. Mild interstitial prominence noted in the lung bases. Active pneumonitis cannot be excluded. No pleural effusion or pneumothorax. No acute osseous abnormality.  IMPRESSION: Mild interstitial prominence within the lung bases with low lung volumes. Active pneumonitis cannot be excluded.   Electronically Signed   By: Marcello Moores  Register   On: 12/05/2013 07:37   Dg Chest Port 1 View  12/04/2013   CLINICAL DATA:  Assess infiltrates, edema  EXAM: PORTABLE CHEST - 1 VIEW  COMPARISON:  12/03/2013  FINDINGS: Unchanged interstitial coarsening and hyperinflation. No edema or consolidation. Normal heart size. No visible effusion. No pneumothorax.  IMPRESSION: 1. No edema or consolidation. 2. COPD.   Electronically Signed   By: Jorje Guild M.D.   On: 12/04/2013 05:32   Dg Chest Port 1 View  12/03/2013   CLINICAL DATA:  Respiratory distress  EXAM: PORTABLE CHEST - 1 VIEW  COMPARISON:  12/02/2013  FINDINGS: The heart size appears normal. There is no pleural effusion or edema identified. No airspace consolidation.  IMPRESSION: 1. Stable radiograph of  the chest. 2. No pneumonia or heart failure identified.   Electronically Signed   By: Kerby Moors M.D.   On: 12/03/2013 08:54   Dg Chest Portable 1 View  11/28/2013   CLINICAL DATA:  Loss of consciousness  EXAM: PORTABLE CHEST - 1 VIEW  COMPARISON:  11/23/2013  FINDINGS: Chronic interstitial coarsening. No edema or consolidation. No visible effusion or pneumothorax. Normal heart size for technique.  IMPRESSION: No edema or consolidation.   Electronically Signed   By: Jorje Guild M.D.   On: 11/28/2013 02:56         Subjective:  patient is more alert today. He denies a headache, fevers, chills, chest pain and shortness breath, nausea, vomiting, diarrhea, abdominal pain.  Objective: Filed Vitals:   12/16/13 0110 12/16/13 0900 12/16/13 1011 12/16/13 1426  BP: 98/46 108/46  118/57  Pulse: 64 66  64  Temp: 98.1 F (36.7 C) 98.5 F (36.9 C)  97.6 F (36.4 C)  TempSrc: Oral Oral  Oral  Resp: 20 20  20   Height:      Weight:      SpO2: 96% 96% 98% 99%    Intake/Output Summary (Last 24 hours) at 12/16/13 1429 Last data filed at 12/16/13 0611  Gross per 24 hour  Intake 1662.92 ml  Output   1100 ml  Net 562.92 ml   Weight change:  Exam:  General:  Pt is alert, follows commands appropriately, not in acute distress  HEENT: No icterus, No thrush,  [place]/AT  Cardiovascular: RRR, S1/S2, no rubs, no gallops  Respiratory: Bibasilar crackles. Left clear to auscultation. No wheezing.   Abdomen: Soft/+BS, non tender, non distended, no guarding  Extremities: No edema, No lymphangitis, No petechiae, No rashes, no synovitis  Data Reviewed: Basic Metabolic Panel:  Recent Labs Lab 12/14/13 1435 12/15/13 0635 12/16/13 0240  NA 136* 130* 137  K 4.1 3.4* 3.8  CL 95* 92* 100  CO2 27 24 25   GLUCOSE 86 87 206*  BUN 20 14 13   CREATININE 0.72 0.70 0.80  CALCIUM 9.1 8.2* 8.3*  MG  --   --  1.7   Liver Function Tests:  Recent Labs Lab 12/14/13 1435  AST 20  ALT 45  ALKPHOS  62  BILITOT 1.3*  PROT 5.9*  ALBUMIN 3.2*   No results found for this basename: LIPASE, AMYLASE,  in the last 168 hours  Recent Labs Lab 12/14/13 2147  AMMONIA 17   CBC:  Recent Labs Lab 12/14/13 1435 12/15/13 0635 12/16/13 0240  WBC 15.4* 14.0* 9.1  NEUTROABS 11.0*  --   --   HGB 15.9 14.7 13.5  HCT 45.7 42.6 39.6  MCV 86.6 86.4 86.8  PLT 148* 141* 131*   Cardiac Enzymes: No results found for this basename: CKTOTAL, CKMB, CKMBINDEX, TROPONINI,  in the last 168 hours BNP: No components found with this basename: POCBNP,  CBG:  Recent Labs Lab 12/15/13 1632 12/15/13 2003 12/16/13 0049 12/16/13 1010 12/16/13 1133  GLUCAP 144* 171* 216* 218* 176*    Recent Results (from the past 240 hour(s))  CULTURE, BLOOD (ROUTINE X 2)     Status: None   Collection Time    12/14/13  3:03 PM      Result Value Ref Range Status   Specimen Description BLOOD HAND LEFT   Final   Special Requests BOTTLES DRAWN AEROBIC AND ANAEROBIC 5CC   Final   Culture  Setup Time     Final   Value: 12/14/2013 19:29     Performed at [facility]   Culture     Final   Value:        BLOOD CULTURE RECEIVED NO GROWTH TO DATE CULTURE WILL BE HELD FOR 5 DAYS BEFORE ISSUING A FINAL NEGATIVE REPORT     Performed at [facility]   Report Status PENDING   Incomplete  CULTURE, BLOOD (ROUTINE X 2)     Status: None   Collection Time    12/14/13  3:03 PM      Result Value Ref Range Status   Specimen Description BLOOD HAND RIGHT   Final   Special Requests BOTTLES DRAWN AEROBIC AND ANAEROBIC 5CCBLUE 1CCRED   Final   Culture  Setup Time     Final   Value: 12/14/2013 19:29     Performed at [facility]   Culture     Final   Value:        BLOOD CULTURE RECEIVED NO GROWTH TO DATE CULTURE WILL BE HELD FOR 5 DAYS BEFORE ISSUING A FINAL NEGATIVE REPORT     Performed at [facility]   Report Status PENDING   Incomplete  URINE CULTURE     Status: None   Collection Time    12/14/13   4:52 PM      Result Value Ref Range Status   Specimen Description URINE, CATHETERIZED  Final   Special Requests NONE   Final   Culture  Setup Time     Final   Value: 12/14/2013 17:12     Performed at [facility]     Final   Value: NO GROWTH     Performed at [facility]   Culture     Final   Value: NO GROWTH     Performed at [facility]   Report Status 12/15/2013 FINAL   Final     Scheduled Meds: . aspirin  81 mg Oral Daily  . atorvastatin  10 mg Oral q1800  . enoxaparin (LOVENOX) injection  40 mg Subcutaneous Q24H  . insulin aspart  0-9 Units Subcutaneous TID WC  . insulin glargine  25 Units Subcutaneous QHS  . levETIRAcetam  500 mg Oral BID  . LORazepam  1 mg Intravenous Once  . nicotine  14 mg Transdermal Daily  . piperacillin-tazobactam (ZOSYN)  IV  3.375 g Intravenous 3 times per day  . sodium chloride  500 mL Intravenous Once  . sodium chloride  3 mL Intravenous Q12H  . tiotropium  18 mcg Inhalation Daily  . vancomycin  1,250 mg Intravenous Q12H   Continuous Infusions: . sodium chloride Stopped (12/14/13 1810)  . sodium chloride 125 mL/hr at 12/16/13 0000  . sodium chloride       Philis Doke, DO  [facility] Pager [phone]  If 7PM-7AM, please contact night-coverage [email or web address] Password [email or web address] 12/16/2013, 2:29 PM   LOS: 2 days

## 2013-12-16 NOTE — Progress Notes (Signed)
NEURO HOSPITALIST PROGRESS NOTE   SUBJECTIVE:                                                                                                                        No further episodes of confusion.  Aware of where he is and able to follow all commands. No SE on Keppra.   OBJECTIVE:                                                                                                                           Vital signs in last 24 hours: Temp:  [97.5 F (36.4 C)-98.5 F (36.9 C)] 98.5 F (36.9 C) (03/20 0900) Pulse Rate:  [61-66] 66 (03/20 0900) Resp:  [20-22] 20 (03/20 0900) BP: (98-120)/(44-55) 108/46 mmHg (03/20 0900) SpO2:  [95 %-100 %] 98 % (03/20 1011)  Intake/Output from previous day: 03/19 0701 - 03/20 0700 In: 1902.9 [P.O.:780; I.V.:772.9; IV Piggyback:350] Out: 1500 [Urine:1500] Intake/Output this shift:   Nutritional status: Dysphagia  Past Medical History  Diagnosis Date  . HTN (hypertension)   . HLD (hyperlipidemia)   . Pulmonary nodule, right     lower lobe  . COPD (chronic obstructive pulmonary disease)   . Rotator cuff injury     right  . Frozen shoulder   . Shoulder pain   . Hyperkalemia   . Dermatitis seborrheica   . Other diseases of lung, not elsewhere classified   . Kidney stone   . Allergic rhinitis   . ED (erectile dysfunction)   . Diabetes mellitus type II   . Asthma   . Tobacco abuse   . Stroke     pt states "last year"    Neurologic Exam:  Mental Status: Alert, oriented, thought content appropriate.  Speech fluent without evidence of aphasia.  Able to follow 3 step commands without difficulty. Cranial Nerves: II: Discs flat bilaterally; Visual fields grossly normal, pupils equal, round, reactive to light and accommodation III,IV, VI: ptosis not present, extra-ocular motions intact bilaterally V,VII: smile symmetric, facial light touch sensation normal bilaterally VIII: hearing normal bilaterally IX,X:  gag reflex present XI: bilateral shoulder shrug XII: midline tongue extension without atrophy or fasciculations  Motor: Right : Upper extremity   5/5  Left:     Upper extremity   5/5  Lower extremity   5/5     Lower extremity   5/5 Tone and bulk:normal tone throughout; no atrophy noted Sensory: Pinprick and light touch intact throughout, bilaterally Deep Tendon Reflexes:  2+ bilateral UE and no LE reflexes.  Plantars:  Mute bilaterally  Cerebellar:  normal finger-to-nose, normal heel-to-shin test    Lab Results: Basic Metabolic Panel:  Recent Labs Lab 12/14/13 1435 12/15/13 0635 12/16/13 0240  NA 136* 130* 137  K 4.1 3.4* 3.8  CL 95* 92* 100  CO2 27 24 25   GLUCOSE 86 87 206*  BUN 20 14 13   CREATININE 0.72 0.70 0.80  CALCIUM 9.1 8.2* 8.3*  MG  --   --  1.7    Liver Function Tests:  Recent Labs Lab 12/14/13 1435  AST 20  ALT 45  ALKPHOS 62  BILITOT 1.3*  PROT 5.9*  ALBUMIN 3.2*   No results found for this basename: LIPASE, AMYLASE,  in the last 168 hours  Recent Labs Lab 12/14/13 2147  AMMONIA 17    CBC:  Recent Labs Lab 12/14/13 1435 12/15/13 0635 12/16/13 0240  WBC 15.4* 14.0* 9.1  NEUTROABS 11.0*  --   --   HGB 15.9 14.7 13.5  HCT 45.7 42.6 39.6  MCV 86.6 86.4 86.8  PLT 148* 141* 131*    Cardiac Enzymes: No results found for this basename: CKTOTAL, CKMB, CKMBINDEX, TROPONINI,  in the last 168 hours  Lipid Panel:  Recent Labs Lab 12/15/13 0635  CHOL 132  TRIG 164*  HDL 62  CHOLHDL 2.1  VLDL 33  LDLCALC 37    CBG:  Recent Labs Lab 12/15/13 1632 12/15/13 2003 12/16/13 0049 12/16/13 1010 12/16/13 1133  GLUCAP 144* 171* 216* 218* 176*    Microbiology: Results for orders placed during the hospital encounter of 12/14/13  CULTURE, BLOOD (ROUTINE X 2)     Status: None   Collection Time    12/14/13  3:03 PM      Result Value Ref Range Status   Specimen Description BLOOD HAND LEFT   Final   Special Requests BOTTLES  DRAWN AEROBIC AND ANAEROBIC 5CC   Final   Culture  Setup Time     Final   Value: 12/14/2013 19:29     Performed at [facility]   Culture     Final   Value:        BLOOD CULTURE RECEIVED NO GROWTH TO DATE CULTURE WILL BE HELD FOR 5 DAYS BEFORE ISSUING A FINAL NEGATIVE REPORT     Performed at [facility]   Report Status PENDING   Incomplete  CULTURE, BLOOD (ROUTINE X 2)     Status: None   Collection Time    12/14/13  3:03 PM      Result Value Ref Range Status   Specimen Description BLOOD HAND RIGHT   Final   Special Requests BOTTLES DRAWN AEROBIC AND ANAEROBIC 5CCBLUE 1CCRED   Final   Culture  Setup Time     Final   Value: 12/14/2013 19:29     Performed at [facility]   Culture     Final   Value:        BLOOD CULTURE RECEIVED NO GROWTH TO DATE CULTURE WILL BE HELD FOR 5 DAYS BEFORE ISSUING A FINAL NEGATIVE REPORT     Performed at [facility]   Report Status PENDING   Incomplete  URINE CULTURE  Status: None   Collection Time    12/14/13  4:52 PM      Result Value Ref Range Status   Specimen Description URINE, CATHETERIZED   Final   Special Requests NONE   Final   Culture  Setup Time     Final   Value: 12/14/2013 17:12     Performed at [facility]     Final   Value: NO GROWTH     Performed at [facility]   Culture     Final   Value: NO GROWTH     Performed at [facility]   Report Status 12/15/2013 FINAL   Final    Coagulation Studies: No results found for this basename: LABPROT, INR,  in the last 72 hours  Imaging: Dg Chest 2 View  12/14/2013   CLINICAL DATA:  ALTERED MENTAL STATUS  EXAM: CHEST  2 VIEW  COMPARISON:  DG CHEST 1V PORT dated 12/07/2013  FINDINGS: Low lung volumes. There is increased density projects in the lung bases right greater than left. Cardiac silhouettes within normal limits. Calcified lymph nodes project within the infrahilar region on the left. The osseous structures  demonstrate no acute abnormalities.  IMPRESSION: Low lung volumes which accentuates lung findings. There are areas of increased density within the lung bases differential considerations are atelectasis versus infiltrates. Right greater than left.   Electronically Signed   By: Margaree Mackintosh M.D.   On: 12/14/2013 16:07   Ct Head Wo Contrast  12/14/2013   CLINICAL DATA:  Altered mental status  EXAM: CT HEAD WITHOUT CONTRAST  TECHNIQUE: Contiguous axial images were obtained from the base of the skull through the vertex without intravenous contrast. Study was obtained within 24 hr of patient's arrival at the emergency department.  COMPARISON:  Brain CT November 28, 2013 and brain MRI November 29, 2013.  FINDINGS: Mild generalized atrophy remains stable. There is no demonstrable mass, hemorrhage, extra-axial fluid collection, or midline shift. Patchy small vessel disease in the centra semiovale bilaterally is stable. No new gray-white compartment lesions are identified. There is no acute appearing infarct on this study.  The bony calvarium appears intact. Mastoids on the left are clear. Mastoids on the right are partially opacified, stable. There is patchy ethmoid sinus disease bilaterally. Atrophy of the left parotid gland with fatty replacement remains. The visualized right parotid gland appears somewhat hypertrophied but otherwise unremarkable in appearance, unchanged from recent prior study.  IMPRESSION: Atrophy with small vessel disease. No intracranial mass, hemorrhage, or acute appearing infarct.  Atrophy of the left parotid gland with relative hypertrophy of the right parotid gland appears stable. There is right mastoid as well as bilateral ethmoid sinus disease.   Electronically Signed   By: Lowella Grip M.D.   On: 12/14/2013 16:07   Mr Brain Wo Contrast  12/14/2013   CLINICAL DATA:  Aphasia. The examination had to be discontinued prior to completion due to patient combativeness.  EXAM: MRI HEAD WITHOUT  CONTRAST  TECHNIQUE: Multiplanar, multiecho pulse sequences of the brain and surrounding structures were obtained without intravenous contrast.  COMPARISON:  CT HEAD W/O CM dated 12/14/2013; MR HEAD W/O CM dated 11/29/2013  FINDINGS: The diffusion-weighted images demonstrate no evidence for acute or subacute infarction. Diffuse white matter changes are again noted.  IMPRESSION: 1. No acute abnormality. 2. The study was discontinued after the diffusion-weighted imaging as the patient was combative and noncooperative. 3. Diffuse white matter changes are again noted.  Electronically Signed   By: Lawrence Santiago M.D.   On: 12/14/2013 21:04    EEG:  Clinical Interpretation: This EEG demonstrates a non-specific region of dysfunction in the left temporal region. There was no seizure or seizure predisposition recorded on this study.     MEDICATIONS                                                                                                                        Scheduled: . aspirin  81 mg Oral Daily  . atorvastatin  10 mg Oral q1800  . enoxaparin (LOVENOX) injection  40 mg Subcutaneous Q24H  . insulin aspart  0-9 Units Subcutaneous 6 times per day  . insulin glargine  20 Units Subcutaneous QHS  . levETIRAcetam  500 mg Oral BID  . nicotine  14 mg Transdermal Daily  . piperacillin-tazobactam (ZOSYN)  IV  3.375 g Intravenous 3 times per day  . sodium chloride  500 mL Intravenous Once  . sodium chloride  3 mL Intravenous Q12H  . tiotropium  18 mcg Inhalation Daily  . vancomycin  1,250 mg Intravenous Q12H    ASSESSMENT/PLAN:                                                                                                            73 YO presenting with confusion which has now cleared.  Left temporal slow activity on EEG. With recurrent spells of loss of awareness as well as post-ictal aphasia. 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  Currently is on Keppra 500 mg BID and having no SE. MRI brain showed  no acute infarct with DWI--study was not completed due to patient being combative.   Awaiting repeat MRI brain.   Assessment and plan discussed with with attending physician and they are in agreement.    Etta Quill PA-C [facility] [phone]  12/16/2013, 1:26 PM

## 2013-12-17 DIAGNOSIS — F05 Delirium due to known physiological condition: Secondary | ICD-10-CM

## 2013-12-17 LAB — GLUCOSE, CAPILLARY
Glucose-Capillary: 119 mg/dL — ABNORMAL HIGH (ref 70–99)
Glucose-Capillary: 101 mg/dL — ABNORMAL HIGH (ref 70–99)
Glucose-Capillary: 103 mg/dL — ABNORMAL HIGH (ref 70–99)
Glucose-Capillary: 228 mg/dL — ABNORMAL HIGH (ref 70–99)
Glucose-Capillary: 210 mg/dL — ABNORMAL HIGH (ref 70–99)
Glucose-Capillary: 200 mg/dL — ABNORMAL HIGH (ref 70–99)
Glucose-Capillary: 265 mg/dL — ABNORMAL HIGH (ref 70–99)

## 2013-12-17 LAB — CBC
HCT: 41.7 % (ref 39.0–52.0)
Hemoglobin: 14.3 g/dL (ref 13.0–17.0)
MCH: 29.8 pg (ref 26.0–34.0)
MCHC: 34.3 g/dL (ref 30.0–36.0)
MCV: 86.9 fL (ref 78.0–100.0)
Platelets: 141 10*3/uL — ABNORMAL LOW (ref 150–400)
RBC: 4.8 MIL/uL (ref 4.22–5.81)
RDW: 14 % (ref 11.5–15.5)
WBC: 9.5 10*3/uL (ref 4.0–10.5)

## 2013-12-17 LAB — BASIC METABOLIC PANEL
BUN: 10 mg/dL (ref 6–23)
CO2: 26 mEq/L (ref 19–32)
Calcium: 8.7 mg/dL (ref 8.4–10.5)
Chloride: 100 mEq/L (ref 96–112)
Creatinine, Ser: 0.86 mg/dL (ref 0.50–1.35)
GFR calc Af Amer: >90 mL/min (ref >90)
GFR calc non Af Amer: 84 mL/min — ABNORMAL LOW (ref >90)
Glucose, Bld: 112 mg/dL — ABNORMAL HIGH (ref 70–99)
Potassium: 3.8 mEq/L (ref 3.7–5.3)
Sodium: 139 mEq/L (ref 137–147)

## 2013-12-17 LAB — HEMOGLOBIN A1C
Hgb A1c MFr Bld: 8.8 % — ABNORMAL HIGH (ref <5.7)
Mean Plasma Glucose: 206 mg/dL — ABNORMAL HIGH (ref <117)

## 2013-12-17 NOTE — Progress Notes (Signed)
Clinical Social Work Department BRIEF PSYCHOSOCIAL ASSESSMENT 12/17/2013  Patient:  Jonathan [name], Jonathan [name]     Account Number:  [id number]     Admit date:  12/14/2013  Clinical Social Worker:  Rolinda Roan  Date/Time:  12/17/2013 02:12 PM  Referred by:  Physician  Date Referred:  12/16/2013 Referred for  SNF Placement   Other Referral:   Interview type:  Family Other interview type:    PSYCHOSOCIAL DATA Living Status:  SIGNIFICANT OTHER Admitted from facility:   Level of care:   Primary support name:  Jonathan [name] [phone] Primary support relationship to patient:  SPOUSE Degree of support available:   Jonathan [name] is patient's girlfriend and has been with him for 19 years.    CURRENT CONCERNS  Other Concerns:    SOCIAL WORK ASSESSMENT / PLAN Clinical Social Worker (CSW) received call from [place] who identifed herself as patient's girlfried. Jonathan [name] reported that she has been with patient for 19 years and they live together in [place]. Jonathan [name] stated that patient is retired from the [employer]. CSW explaiend that PT is recomneding inpatient rehab at [facility] and that we will do a SNF search in case [facility] does not accpet the patient. Jonathan [name] verbalized her understnading and stated that their first choice is [facility] and then [facility]. Jonathan [name] said that she is going out of town tomorrow for 1 week to her father's funeral and that she would be available by phone.   Assessment/plan status:  Psychosocial Support/Ongoing Assessment of Needs Other assessment/ plan:   Information/referral to community resources:    PATIENT'S/FAMILY'S RESPONSE TO PLAN OF CARE: Jonathan [name] thanked CSW for starting placement process. Jonathan [name] reported that she feels guilty for leaving for a week. CSW provided emotional support and encouraged Jonathan [name] that the patient is in good hands.

## 2013-12-17 NOTE — Progress Notes (Addendum)
Clinical Social Work Department CLINICAL SOCIAL WORK PLACEMENT NOTE 12/17/2013  Patient:  FABIEN, TRAVELSTEAD  Account Number:  [id number] Admit date:  12/14/2013  Clinical Social Worker:  Blima Rich, Latanya Presser  Date/time:  12/17/2013 02:33 PM  Clinical Social Work is seeking post-discharge placement for this patient at the following level of care:   [place]   (*CSW will update this form in Epic as items are completed)   12/17/2013  Patient/family provided with [place] Department of Clinical Social Works list of facilities offering this level of care within the geographic area requested by the patient (or if unable, by the patients family).  12/17/2013  Patient/family informed of their freedom to choose among providers that offer the needed level of care, that participate in Medicare, Medicaid or managed care program needed by the patient, have an available bed and are willing to accept the patient.  12/17/2013  Patient/family informed of [facility] ownership interest in [facility], as well as of the fact that they are under no obligation to receive care at this facility.  PASARR submitted to EDS on 12/17/2013 PASARR number received from EDS on 12/17/2013  FL2 transmitted to all facilities in geographic area requested by pt/family on  12/17/2013 FL2 transmitted to all facilities within larger geographic area on   Patient informed that his/her managed care company has contracts with or will negotiate with  certain facilities, including the following:     Patient/family informed of bed offers received:  12/19/2013 Patient chooses bed at  [facility] Physician recommends and patient chooses bed at    Patient to be transferred to  on  12/19/2013 Patient to be transferred to facility by [facility]  The following physician request were entered in Epic:   Additional Comments:  Pati Gallo, [place] Worker [phone]

## 2013-12-17 NOTE — Progress Notes (Addendum)
[facility] PROGRESS NOTE  Jonathan [name] [id number] DOB: 24-Oct-1940 DOA: 12/14/2013 PCP: Loura Pardon, MD  Assessment/Plan: Acute encephalopathy/dysarthria  -Likely due to a combination of aspiration pneumonitis and seizure  -Improving-->back to baseline -MRI brain negative for acute infarct  -Concerned about aspiration pneumonitis/HCAP  -11/28/2013 echocardiogram EF 55-60%  -Ammonia 17  -11/28/2013 TSH 0.447  -Urinalysis negative for pyuria  -Urine drug screen negative  HCAP/Aspiration pneumonia  -Blood cultures x2 sets--remains negative -speech therapy eval for swallowing  -Initially started empiric vancomycin and zosyn  -Discontinue vancomycin 12/17/2013 -Patient failed RN swallow evaluation 12/14/2013  - evaluated by speech therapy --> dysphagia 3 diet  Abnormal EEG  -Slow left temporal activity  -Appreciate neurology input  -Continue Keppra per neurology recommendations  -repeat MRI brain--no acute infarct. 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 COPD  -50-pack-year history  -Stable  -On 2 L nasal cannula at home  Diabetes mellitus type 2, uncontrolled  -11/28/2013 hemoglobin A1c 8.4  -Increase Lantus to 25 units at bedtime  -change ISS to qac/hs  Hyperlipidemia  -Continue statin  Hypertension  -Hold amlodipine as the patient's blood pressure is soft  Chronic respiratory failure  -The patient is maintained on 2 L nasal cannula at home  Hyponatremia  -Likely volume depletion  -IVF-->improving--> saline lock IV fluids Deconditioning -PT recommend CIR -Consult CIR  Family Communication: Daughter updated on phone  Disposition Plan: Home when medically stable  Antibiotics:  Vancomycin 12/15/13>>>  Zosyn 12/15/13>>>          Procedures/Studies: Dg Chest 2 View  12/14/2013   CLINICAL DATA:  ALTERED MENTAL STATUS  EXAM: CHEST  2 VIEW  COMPARISON:  DG CHEST 1V PORT dated 12/07/2013  FINDINGS: Low lung volumes. There is  increased density projects in the lung bases right greater than left. Cardiac silhouettes within normal limits. Calcified lymph nodes project within the infrahilar region on the left. The osseous structures demonstrate no acute abnormalities.  IMPRESSION: Low lung volumes which accentuates lung findings. There are areas of increased density within the lung bases differential considerations are atelectasis versus infiltrates. Right greater than left.   Electronically Signed   By: Margaree Mackintosh M.D.   On: 12/14/2013 16:07   Dg Chest 2 View  12/02/2013   CLINICAL DATA:  Shortness of Breath  EXAM: CHEST  2 VIEW  COMPARISON:  Chest radiograph November 28, 2013 and chest CT November 28, 2013  FINDINGS: There is a degree of underlying emphysematous change. There is no edema or consolidation. Heart is borderline enlarged with normal pulmonary vascularity. No adenopathy. No bone lesions.  IMPRESSION: No edema or consolidation. Evidence of a degree of underlying emphysematous change. Mild cardiac prominence.   Electronically Signed   By: Lowella Grip M.D.   On: 12/02/2013 08:20   Dg Chest 2 View  11/23/2013   CLINICAL DATA:  Shortness of breath and cough  EXAM: CHEST  2 VIEW  COMPARISON:  12/02/2011  FINDINGS: Mild interstitial coarsening which appears chronic. No evidence of consolidation in the lateral projection. No edema, effusion, or pneumothorax. Normal heart size.  IMPRESSION: Stable exam.  No edema or consolidation.   Electronically Signed   By: Jorje Guild M.D.   On: 11/23/2013 14:09   Ct Head Wo Contrast  12/14/2013   CLINICAL DATA:  Altered mental status  EXAM: CT HEAD WITHOUT CONTRAST  TECHNIQUE: Contiguous axial images were obtained from the base of the skull through the vertex without intravenous contrast. Study was obtained within  24 hr of patient's arrival at the emergency department.  COMPARISON:  Brain CT November 28, 2013 and brain MRI November 29, 2013.  FINDINGS: Mild generalized atrophy remains stable.  There is no demonstrable mass, hemorrhage, extra-axial fluid collection, or midline shift. Patchy small vessel disease in the centra semiovale bilaterally is stable. No new gray-white compartment lesions are identified. There is no acute appearing infarct on this study.  The bony calvarium appears intact. Mastoids on the left are clear. Mastoids on the right are partially opacified, stable. There is patchy ethmoid sinus disease bilaterally. Atrophy of the left parotid gland with fatty replacement remains. The visualized right parotid gland appears somewhat hypertrophied but otherwise unremarkable in appearance, unchanged from recent prior study.  IMPRESSION: Atrophy with small vessel disease. No intracranial mass, hemorrhage, or acute appearing infarct.  Atrophy of the left parotid gland with relative hypertrophy of the right parotid gland appears stable. There is right mastoid as well as bilateral ethmoid sinus disease.   Electronically Signed   By: Lowella Grip M.D.   On: 12/14/2013 16:07   Ct Head Wo Contrast  11/28/2013   CLINICAL DATA:  Fall.  EXAM: CT HEAD WITHOUT CONTRAST  TECHNIQUE: Contiguous axial images were obtained from the base of the skull through the vertex without intravenous contrast.  COMPARISON:  MR HEAD W/O CM dated 12/01/2011  FINDINGS: The ventricles and sulci are normal for age. No intraparenchymal hemorrhage, mass effect nor midline shift. Patchy supratentorial white matter hypodensities are within normal range for patient's age and though non-specific suggest sequelae of chronic small vessel ischemic disease. No acute large vascular territory infarcts.  No abnormal extra-axial fluid collections. Basal cisterns are patent. Moderate calcific atherosclerosis of the carotid siphons.  No skull fracture. Small left maxillary mucosal retention cyst, mild paranasal sinus mucosal thickening without air-fluid levels. Mastoid air cells are well aerated. Moderate temporomandibular osteoarthrosis.  . The included ocular globes and orbital contents are non-suspicious. Marked asymmetry of the parotid glands, the right appears enlarged in the left appears very fatty replaced.  IMPRESSION: No acute intracranial process.  Moderate white matter changes suggest chronic small vessel ischemic disease.  Asymmetric parotid glands, the right appears mildly edematous and the left appears fatty replaced versus resected with flap, recommend clinical correlation and correlation with surgical history.   Electronically Signed   By: Elon Alas   On: 11/28/2013 05:40   Ct Angio Chest Pe W/cm &/or Wo Cm  11/28/2013   CLINICAL DATA:  Shortness of breath. Found down. Evaluate for potential pulmonary embolism.  EXAM: CT ANGIOGRAPHY CHEST WITH CONTRAST  TECHNIQUE: Multidetector CT imaging of the chest was performed using the standard protocol during bolus administration of intravenous contrast. Multiplanar CT image reconstructions and MIPs were obtained to evaluate the vascular anatomy.  CONTRAST:  175mL OMNIPAQUE IOHEXOL 350 MG/ML SOLN  COMPARISON:  Chest CT 05/07/2010.  FINDINGS: Mediastinum: Study is limited by patient respiratory motion. With these limitations in mind, there is no central, lobar or segmental sized pulmonary embolism. Accurate assessment for smaller subsegmental sized pulmonary embolism is not possible secondary to the limitations of this examination. Heart size is normal. There is no significant pericardial fluid, thickening or pericardial calcification. There is atherosclerosis of the thoracic aorta, the great vessels of the mediastinum and the coronary arteries, including calcified atherosclerotic plaque in the left main, left anterior descending, left circumflex right coronary arteries. No pathologically enlarged mediastinal or hilar lymph nodes. Esophagus is unremarkable in appearance.  Lungs/Pleura: Moderate centrilobular emphysema,  most pronounced in the lung apices. No acute consolidative airspace  disease. No pleural effusions. 5 mm right lower lobe nodule (image 89 of series 6), unchanged compared to prior study from 05/07/2010; this can be considered benign and does not require image followup). No other larger suspicious appearing pulmonary nodules or masses are identified on today's examination.  Upper Abdomen: Unremarkable.  Musculoskeletal: There are no aggressive appearing lytic or blastic lesions noted in the visualized portions of the skeleton.  Review of the MIP images confirms the above findings.  IMPRESSION: 1. Limited examination secondary to patient motion demonstrated no evidence of clinically significant central, lobar or segmental sized pulmonary embolism. Smaller subsegmental sized pulmonary embolism cannot be entirely excluded. 2. No acute findings in the thorax to account for the patient's symptoms. 3. Atherosclerosis, including left main and 3 vessel coronary artery disease. Assessment for potential risk factor modification, dietary therapy or pharmacologic therapy may be warranted, if clinically indicated. 4. Moderate centrilobular emphysema. 5. Additional incidental findings, as above.   Electronically Signed   By: Vinnie Langton M.D.   On: 11/28/2013 14:15   Mr Brain Wo Contrast  12/14/2013   CLINICAL DATA:  Aphasia. The examination had to be discontinued prior to completion due to patient combativeness.  EXAM: MRI HEAD WITHOUT CONTRAST  TECHNIQUE: Multiplanar, multiecho pulse sequences of the brain and surrounding structures were obtained without intravenous contrast.  COMPARISON:  CT HEAD W/O CM dated 12/14/2013; MR HEAD W/O CM dated 11/29/2013  FINDINGS: The diffusion-weighted images demonstrate no evidence for acute or subacute infarction. Diffuse white matter changes are again noted.  IMPRESSION: 1. No acute abnormality. 2. The study was discontinued after the diffusion-weighted imaging as the patient was combative and noncooperative. 3. Diffuse white matter changes are again  noted.   Electronically Signed   By: Lawrence Santiago M.D.   On: 12/14/2013 21:04   Mr Brain Wo Contrast  11/29/2013   CLINICAL DATA:  Confusion.  Hypertension.  COPD.  EXAM: MRI HEAD WITHOUT CONTRAST  TECHNIQUE: Multiplanar, multiecho pulse sequences of the brain and surrounding structures were obtained without intravenous contrast.  COMPARISON:  CT HEAD W/O CM dated 11/28/2013; MR HEAD W/O CM dated 12/01/2011; MR MRA HEAD W/O CM dated 12/01/2011  FINDINGS: No evidence for acute infarction, hemorrhage, mass lesion, hydrocephalus, or extra-axial fluid. Advanced atrophy. Extensive chronic microvascular ischemic change throughout the periventricular and subcortical white matter. No foci of chronic hemorrhage. Pituitary, pineal, and cerebellar tonsils unremarkable. No upper cervical lesions. Visualized calvarium, skull base, and upper cervical osseous structures unremarkable. Scalp and extracranial soft tissues, orbits, and sinuses show no acute process. Chronic right mastoid fluid likely effusion was demonstrated in 2013.  Left parotid remains atrophic as compared to the right. This appearance was present in 2013 and is stable.  IMPRESSION: Advanced atrophy with extensive small vessel disease. Cyst similar to priors. No acute intracranial findings.  Atrophic left parotid gland.   Electronically Signed   By: Rolla Flatten M.D.   On: 11/29/2013 15:49   US Renal  12/03/2013   CLINICAL DATA:  Assess for perinephric abscess.  EXAM: RENAL/URINARY TRACT ULTRASOUND COMPLETE  COMPARISON:  07/09/2009  FINDINGS: Right Kidney:  Length: 12.5 cm. Echogenicity within normal limits. No mass or hydronephrosis visualized.  Left Kidney:  Length: 3.8 cm. Echogenicity within normal limits. No mass or hydronephrosis visualized. Cyst within the inferior pole is again noted measuring 2.8 x 3.2 x 3.4 cm. This contains some diffuse low level internal echoes. Increased through transmission  is identified.  Bladder:  Appears normal for degree of  bladder distention.  IMPRESSION: 1. No evidence for renal abscess. 2. Stable cyst within the inferior pole of the left kidney.   Electronically Signed   By: Kerby Moors M.D.   On: 12/03/2013 11:23   Dg Chest Port 1 View  12/07/2013   CLINICAL DATA Hypoxia  EXAM PORTABLE CHEST - 1 VIEW  COMPARISON December 05, 2013  FINDINGS There is underlying emphysematous change. There is no edema or consolidation. The heart is upper normal in size. The pulmonary vascularity reflects underlying emphysema. No adenopathy. There is arthropathy in both shoulders.  IMPRESSION Underlying emphysematous change.  No edema or consolidation.  SIGNATURE  Electronically Signed   By: Lowella Grip M.D.   On: 12/07/2013 07:15   Dg Chest Port 1 View  12/05/2013   CLINICAL DATA:  Shortness of breath.  EXAM: PORTABLE CHEST - 1 VIEW  COMPARISON:  DG CHEST 1V PORT dated 12/04/2013; DG CHEST 2 VIEW dated 12/02/2013; CT ANGIO CHEST W/CM &/OR WO/CM dated 11/28/2013; DG CHEST 2 VIEW dated 11/23/2013  FINDINGS: The mediastinum and hilar structures are normal. Stable cardiomegaly. Normal pulmonary vascularity. Mild interstitial prominence noted in the lung bases. Active pneumonitis cannot be excluded. No pleural effusion or pneumothorax. No acute osseous abnormality.  IMPRESSION: Mild interstitial prominence within the lung bases with low lung volumes. Active pneumonitis cannot be excluded.   Electronically Signed   By: Marcello Moores  Register   On: 12/05/2013 07:37   Dg Chest Port 1 View  12/04/2013   CLINICAL DATA:  Assess infiltrates, edema  EXAM: PORTABLE CHEST - 1 VIEW  COMPARISON:  12/03/2013  FINDINGS: Unchanged interstitial coarsening and hyperinflation. No edema or consolidation. Normal heart size. No visible effusion. No pneumothorax.  IMPRESSION: 1. No edema or consolidation. 2. COPD.   Electronically Signed   By: Jorje Guild M.D.   On: 12/04/2013 05:32   Dg Chest Port 1 View  12/03/2013   CLINICAL DATA:  Respiratory distress  EXAM: PORTABLE  CHEST - 1 VIEW  COMPARISON:  12/02/2013  FINDINGS: The heart size appears normal. There is no pleural effusion or edema identified. No airspace consolidation.  IMPRESSION: 1. Stable radiograph of the chest. 2. No pneumonia or heart failure identified.   Electronically Signed   By: Kerby Moors M.D.   On: 12/03/2013 08:54   Dg Chest Portable 1 View  11/28/2013   CLINICAL DATA:  Loss of consciousness  EXAM: PORTABLE CHEST - 1 VIEW  COMPARISON:  11/23/2013  FINDINGS: Chronic interstitial coarsening. No edema or consolidation. No visible effusion or pneumothorax. Normal heart size for technique.  IMPRESSION: No edema or consolidation.   Electronically Signed   By: Jorje Guild M.D.   On: 11/28/2013 02:56   Mr Jodene Nam Head/brain Wo Cm  12/16/2013   CLINICAL DATA:  Stroke  EXAM: MRI HEAD WITHOUT CONTRAST  MRA HEAD WITHOUT CONTRAST  TECHNIQUE: Multiplanar, multiecho pulse sequences of the brain and surrounding structures were obtained without intravenous contrast. Angiographic images of the head were obtained using MRA technique without contrast.  COMPARISON:  12/14/2013.  11/29/2013, MRI 12/01/2011  FINDINGS: MRI HEAD FINDINGS  There is mild motion degradation. Diffusion imaging does not show any acute or subacute infarction. The brainstem is normal. There is cerebellar atrophy. The cerebral hemispheres show old cortical and subcortical ischemic change in the right occipital lobe. Extensive areas of abnormal signal throughout the hemispheric white matter. These are consistent with old small vessel insults.  Mild chronic small-vessel changes noted in the thalami and basal ganglia. No large vessel territory infarction. No mass lesion, hemorrhage, hydrocephalus or extra-axial collection. Paranasal sinuses do not show any significant inflammation. There is fluid throughout the right mastoid air cells.  MRA HEAD FINDINGS  Both internal carotid arteries are patent into the brain. There is atherosclerotic irregularity and  narrowing in both carotid siphon regions. The left internal carotid artery supplies the left middle cerebral artery and both anterior cerebral arteries. The right anterior cerebral artery is severely diseased and does not show demonstrable flow on today's examination. On the previous study of March 2013, there was minimal flow a in this vessel. Both posterior cerebral arteries take a fetal origin from the anterior circulation.  Right vertebral artery is the dominant vessel widely patent to the basilar. There is some atherosclerotic irregularity of the distal right vertebral artery unchanged since the previous study. The left vertebral artery is a small vessel with extensive atherosclerotic change. This vessel all may give minimal supply to the basilar. No basilar stenosis. Superior cerebellar and posterior cerebral arteries are patent bilaterally. There is atherosclerotic irregularity of the more distal branch vessels.  IMPRESSION: No acute infarction. Extensive chronic small vessel changes throughout the brain. 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  Atherosclerotic narrowing and irregularity of both carotid siphon regions. Severely diseased right A1 segment which does not definitely show flow today. Both the anterior cerebral arteries are appear well supplied from the left carotid system.  Severely diseased left vertebral artery. The right vertebral artery is dominant and widely patent to the basilar. Posterior circulation branch vessels are patent but show atherosclerotic irregularity.   Electronically Signed   By: Nelson Chimes M.D.   On: 12/16/2013 15:53         Subjective: Patient is doing well. He denies any fevers, chills, chest discomfort, shortness breath, nausea, vomiting, diarrhea, vomiting, dysuria, hematuria.  Objective: Filed Vitals:   12/16/13 2052 12/17/13 0557 12/17/13 1000 12/17/13 1412  BP: 117/63 106/90 112/57 105/45  Pulse: 74 70 71 65  Temp: 99 F (37.2 C) 98.2 F (36.8  C) 98.7 F (37.1 C) 98.2 F (36.8 C)  TempSrc: Oral Oral Oral Oral  Resp: 22 20 20 20   Height:      Weight:      SpO2: 95% 97% 96% 99%    Intake/Output Summary (Last 24 hours) at 12/17/13 1617 Last data filed at 12/17/13 1100  Gross per 24 hour  Intake 4062.08 ml  Output    851 ml  Net 3211.08 ml   Weight change:  Exam:   General:  Pt is alert, follows commands appropriately, not in acute distress  HEENT: No icterus, No thrush, /AT  Cardiovascular: RRR, S1/S2, no rubs, no gallops  Respiratory: Diminished breath sounds at the bases. Bibasilar crackles. Left clear to auscultation.  Abdomen: Soft/+BS, non tender, non distended, no guarding  Extremities: No edema, No lymphangitis, No petechiae, No rashes, no synovitis  Data Reviewed: Basic Metabolic Panel:  Recent Labs Lab 12/14/13 1435 12/15/13 0635 12/16/13 0240 12/17/13 0558  NA 136* 130* 137 139  K 4.1 3.4* 3.8 3.8  CL 95* 92* 100 100  CO2 27 24 25 26   GLUCOSE 86 87 206* 112*  BUN 20 14 13 10   CREATININE 0.72 0.70 0.80 0.86  CALCIUM 9.1 8.2* 8.3* 8.7  MG  --   --  1.7  --    Liver Function Tests:  Recent Labs Lab 12/14/13 1435  AST  20  ALT 45  ALKPHOS 62  BILITOT 1.3*  PROT 5.9*  ALBUMIN 3.2*   No results found for this basename: LIPASE, AMYLASE,  in the last 168 hours  Recent Labs Lab 12/14/13 2147  AMMONIA 17   CBC:  Recent Labs Lab 12/14/13 1435 12/15/13 0635 12/16/13 0240 12/17/13 0558  WBC 15.4* 14.0* 9.1 9.5  NEUTROABS 11.0*  --   --   --   HGB 15.9 14.7 13.5 14.3  HCT 45.7 42.6 39.6 41.7  MCV 86.6 86.4 86.8 86.9  PLT 148* 141* 131* 141*   Cardiac Enzymes: No results found for this basename: CKTOTAL, CKMB, CKMBINDEX, TROPONINI,  in the last 168 hours BNP: No components found with this basename: POCBNP,  CBG:  Recent Labs Lab 12/16/13 2352 12/17/13 0401 12/17/13 0746 12/17/13 0756 12/17/13 1145  GLUCAP 143* 119* 103* 101* 228*    Recent Results (from the  past 240 hour(s))  CULTURE, BLOOD (ROUTINE X 2)     Status: None   Collection Time    12/14/13  3:03 PM      Result Value Ref Range Status   Specimen Description BLOOD HAND LEFT   Final   Special Requests BOTTLES DRAWN AEROBIC AND ANAEROBIC 5CC   Final   Culture  Setup Time     Final   Value: 12/14/2013 19:29     Performed at [facility]   Culture     Final   Value:        BLOOD CULTURE RECEIVED NO GROWTH TO DATE CULTURE WILL BE HELD FOR 5 DAYS BEFORE ISSUING A FINAL NEGATIVE REPORT     Performed at [facility]   Report Status PENDING   Incomplete  CULTURE, BLOOD (ROUTINE X 2)     Status: None   Collection Time    12/14/13  3:03 PM      Result Value Ref Range Status   Specimen Description BLOOD HAND RIGHT   Final   Special Requests BOTTLES DRAWN AEROBIC AND ANAEROBIC 5CCBLUE 1CCRED   Final   Culture  Setup Time     Final   Value: 12/14/2013 19:29     Performed at [facility]   Culture     Final   Value:        BLOOD CULTURE RECEIVED NO GROWTH TO DATE CULTURE WILL BE HELD FOR 5 DAYS BEFORE ISSUING A FINAL NEGATIVE REPORT     Performed at [facility]   Report Status PENDING   Incomplete  URINE CULTURE     Status: None   Collection Time    12/14/13  4:52 PM      Result Value Ref Range Status   Specimen Description URINE, CATHETERIZED   Final   Special Requests NONE   Final   Culture  Setup Time     Final   Value: 12/14/2013 17:12     Performed at [facility]     Final   Value: NO GROWTH     Performed at [facility]   Culture     Final   Value: NO GROWTH     Performed at [facility]   Report Status 12/15/2013 FINAL   Final     Scheduled Meds: . aspirin  81 mg Oral Daily  . atorvastatin  10 mg Oral q1800  . enoxaparin (LOVENOX) injection  40 mg Subcutaneous Q24H  . insulin aspart  0-9 Units Subcutaneous TID WC  .  insulin glargine  25 Units Subcutaneous QHS  . levETIRAcetam  500 mg Oral BID  .  nicotine  14 mg Transdermal Daily  . piperacillin-tazobactam (ZOSYN)  IV  3.375 g Intravenous 3 times per day  . sodium chloride  500 mL Intravenous Once  . sodium chloride  3 mL Intravenous Q12H  . tiotropium  18 mcg Inhalation Daily  . vancomycin  1,250 mg Intravenous Q12H   Continuous Infusions: . sodium chloride 1,000 mL (12/16/13 1803)  . sodium chloride 125 mL/hr at 12/16/13 0000  . sodium chloride       Savvy Peeters, DO  [facility] Pager [phone]  If 7PM-7AM, please contact night-coverage [email or web address] Password [facility] 12/17/2013, 4:17 PM   LOS: 3 days

## 2013-12-17 NOTE — Progress Notes (Signed)
Subjective: No significant cahnges, discussing with roommate, it sounds like he has had more than 3(mulitple per her estimate) episodes of sudden confusion.   Exam: Filed Vitals:   12/17/13 0557  BP: 106/90  Pulse: 70  Temp: 98.2 F (36.8 C)  Resp: 20   Gen: In bed, NAD MS: Awake, alert, oriented, able to spell world backwards YW:VPXTG, EOMI Motor: MAEW Sensory:intact to LT  WBC downtrending  Impression: 73 yo M with multiple episodes of sudden mental change concerning for intermittent seizures. He does have some slowing in teh left temporal region on EEG, but this is subtle and not epileptiform. I would favor starting keppra, however there remains a possibility that this is intermittent delirium associated with infections. If spells continue or worsen, then could try to perform prolonged EEG to capture one.   Recommendations: 1) Keppra 500mg  BID 2) Can follow up as outpatient with neurology in 2 - 4 weeks.  3) No further recs at this time, please call with any further questions or concerns.   Roland Rack, MD [facility] [phone]  If 7pm- 7am, please page neurology on call as listed in [place].

## 2013-12-18 LAB — BASIC METABOLIC PANEL
BUN: 9 mg/dL (ref 6–23)
CO2: 26 mEq/L (ref 19–32)
Calcium: 8.5 mg/dL (ref 8.4–10.5)
Chloride: 98 mEq/L (ref 96–112)
Creatinine, Ser: 0.83 mg/dL (ref 0.50–1.35)
GFR calc Af Amer: >90 mL/min (ref >90)
GFR calc non Af Amer: 85 mL/min — ABNORMAL LOW (ref >90)
Glucose, Bld: 148 mg/dL — ABNORMAL HIGH (ref 70–99)
Potassium: 3.8 mEq/L (ref 3.7–5.3)
Sodium: 138 mEq/L (ref 137–147)

## 2013-12-18 LAB — GLUCOSE, CAPILLARY
Glucose-Capillary: 151 mg/dL — ABNORMAL HIGH (ref 70–99)
Glucose-Capillary: 130 mg/dL — ABNORMAL HIGH (ref 70–99)
Glucose-Capillary: 161 mg/dL — ABNORMAL HIGH (ref 70–99)
Glucose-Capillary: 167 mg/dL — ABNORMAL HIGH (ref 70–99)
Glucose-Capillary: 207 mg/dL — ABNORMAL HIGH (ref 70–99)
Glucose-Capillary: 297 mg/dL — ABNORMAL HIGH (ref 70–99)

## 2013-12-18 MED ORDER — INSULIN GLARGINE 100 UNIT/ML ~~LOC~~ SOLN
29.0000 [IU] | Freq: Every day | SUBCUTANEOUS | Status: DC
  Administered 2013-12-18: 29 [IU] via SUBCUTANEOUS
  Filled 2013-12-18 (×2): qty 0.29

## 2013-12-18 NOTE — Progress Notes (Signed)
[facility] PROGRESS NOTE  Jonathan [name] XTG:[id number] DOB: [birth year] DOA: 12/14/2013 PCP: Loura Pardon, MD  Assessment/Plan: Acute encephalopathy/dysarthria  -Likely due to a combination of aspiration pneumonitis and seizure  -Improving-->back to baseline  -MRI brain negative for acute infarct  -11/28/2013 echocardiogram EF 55-60%  -Ammonia 17  -11/28/2013 TSH 0.447  -Urinalysis negative for pyuria  -Urine drug screen negative  HCAP/Aspiration pneumonia  -Blood cultures x2 sets--remains negative   -Initially started empiric vancomycin and zosyn  -Discontinue vancomycin 12/17/2013  -Patient failed RN swallow evaluation 12/14/2013  - evaluated by speech therapy --> dysphagia 3 diet  Abnormal EEG  -Slow left temporal activity  -Appreciate neurology input  -Continue Keppra per neurology recommendations  -repeat MRI brain--no acute infarct. 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  COPD  -50-pack-year history  -Stable  -On 2 L nasal cannula at home  Diabetes mellitus type 2, uncontrolled  -11/28/2013 hemoglobin A1c 8.4  -Increase Lantus to 29 units at bedtime  -change ISS to qac/hs  Hyperlipidemia  -Continue statin  Hypertension  -Hold amlodipine as the patient's blood pressure is soft  Chronic respiratory failure  -The patient is maintained on 2 L nasal cannula at home  Hyponatremia  -Likely volume depletion  -IVF-->improving--> saline lock IV fluids  Deconditioning  -PT recommend CIR  -Consult CIR  Family Communication: Daughter updated at bedside Disposition Plan: SNF vs. CIR  Antibiotics:  Vancomycin 12/15/13>>> 12/17/13 Zosyn 12/15/13>>>         Procedures/Studies: Dg Chest 2 View  12/14/2013   CLINICAL DATA:  ALTERED MENTAL STATUS  EXAM: CHEST  2 VIEW  COMPARISON:  DG CHEST 1V PORT dated 12/07/2013  FINDINGS: Low lung volumes. There is increased density projects in the lung bases right greater than left. Cardiac  silhouettes within normal limits. Calcified lymph nodes project within the infrahilar region on the left. The osseous structures demonstrate no acute abnormalities.  IMPRESSION: Low lung volumes which accentuates lung findings. There are areas of increased density within the lung bases differential considerations are atelectasis versus infiltrates. Right greater than left.   Electronically Signed   By: Margaree Mackintosh M.D.   On: 12/14/2013 16:07   Dg Chest 2 View  12/02/2013   CLINICAL DATA:  Shortness of Breath  EXAM: CHEST  2 VIEW  COMPARISON:  Chest radiograph November 28, 2013 and chest CT November 28, 2013  FINDINGS: There is a degree of underlying emphysematous change. There is no edema or consolidation. Heart is borderline enlarged with normal pulmonary vascularity. No adenopathy. No bone lesions.  IMPRESSION: No edema or consolidation. Evidence of a degree of underlying emphysematous change. Mild cardiac prominence.   Electronically Signed   By: Lowella Grip M.D.   On: 12/02/2013 08:20   Dg Chest 2 View  11/23/2013   CLINICAL DATA:  Shortness of breath and cough  EXAM: CHEST  2 VIEW  COMPARISON:  12/02/2011  FINDINGS: Mild interstitial coarsening which appears chronic. No evidence of consolidation in the lateral projection. No edema, effusion, or pneumothorax. Normal heart size.  IMPRESSION: Stable exam.  No edema or consolidation.   Electronically Signed   By: Jorje Guild M.D.   On: 11/23/2013 14:09   Ct Head Wo Contrast  12/14/2013   CLINICAL DATA:  Altered mental status  EXAM: CT HEAD WITHOUT CONTRAST  TECHNIQUE: Contiguous axial images were obtained from the base of the skull through the vertex without intravenous contrast. Study was obtained within 24 hr of patient's arrival at  the emergency department.  COMPARISON:  Brain CT November 28, 2013 and brain MRI November 29, 2013.  FINDINGS: Mild generalized atrophy remains stable. There is no demonstrable mass, hemorrhage, extra-axial fluid collection, or  midline shift. Patchy small vessel disease in the centra semiovale bilaterally is stable. No new gray-white compartment lesions are identified. There is no acute appearing infarct on this study.  The bony calvarium appears intact. Mastoids on the left are clear. Mastoids on the right are partially opacified, stable. There is patchy ethmoid sinus disease bilaterally. Atrophy of the left parotid gland with fatty replacement remains. The visualized right parotid gland appears somewhat hypertrophied but otherwise unremarkable in appearance, unchanged from recent prior study.  IMPRESSION: Atrophy with small vessel disease. No intracranial mass, hemorrhage, or acute appearing infarct.  Atrophy of the left parotid gland with relative hypertrophy of the right parotid gland appears stable. There is right mastoid as well as bilateral ethmoid sinus disease.   Electronically Signed   By: Lowella Grip M.D.   On: 12/14/2013 16:07   Ct Head Wo Contrast  11/28/2013   CLINICAL DATA:  Fall.  EXAM: CT HEAD WITHOUT CONTRAST  TECHNIQUE: Contiguous axial images were obtained from the base of the skull through the vertex without intravenous contrast.  COMPARISON:  MR HEAD W/O CM dated 12/01/2011  FINDINGS: The ventricles and sulci are normal for age. No intraparenchymal hemorrhage, mass effect nor midline shift. Patchy supratentorial white matter hypodensities are within normal range for patient's age and though non-specific suggest sequelae of chronic small vessel ischemic disease. No acute large vascular territory infarcts.  No abnormal extra-axial fluid collections. Basal cisterns are patent. Moderate calcific atherosclerosis of the carotid siphons.  No skull fracture. Small left maxillary mucosal retention cyst, mild paranasal sinus mucosal thickening without air-fluid levels. Mastoid air cells are well aerated. Moderate temporomandibular osteoarthrosis. . The included ocular globes and orbital contents are non-suspicious. Marked  asymmetry of the parotid glands, the right appears enlarged in the left appears very fatty replaced.  IMPRESSION: No acute intracranial process.  Moderate white matter changes suggest chronic small vessel ischemic disease.  Asymmetric parotid glands, the right appears mildly edematous and the left appears fatty replaced versus resected with flap, recommend clinical correlation and correlation with surgical history.   Electronically Signed   By: Elon Alas   On: 11/28/2013 05:40   Ct Angio Chest Pe W/cm &/or Wo Cm  11/28/2013   CLINICAL DATA:  Shortness of breath. Found down. Evaluate for potential pulmonary embolism.  EXAM: CT ANGIOGRAPHY CHEST WITH CONTRAST  TECHNIQUE: Multidetector CT imaging of the chest was performed using the standard protocol during bolus administration of intravenous contrast. Multiplanar CT image reconstructions and MIPs were obtained to evaluate the vascular anatomy.  CONTRAST:  132mL OMNIPAQUE IOHEXOL 350 MG/ML SOLN  COMPARISON:  Chest CT 05/07/2010.  FINDINGS: Mediastinum: Study is limited by patient respiratory motion. With these limitations in mind, there is no central, lobar or segmental sized pulmonary embolism. Accurate assessment for smaller subsegmental sized pulmonary embolism is not possible secondary to the limitations of this examination. Heart size is normal. There is no significant pericardial fluid, thickening or pericardial calcification. There is atherosclerosis of the thoracic aorta, the great vessels of the mediastinum and the coronary arteries, including calcified atherosclerotic plaque in the left main, left anterior descending, left circumflex right coronary arteries. No pathologically enlarged mediastinal or hilar lymph nodes. Esophagus is unremarkable in appearance.  Lungs/Pleura: Moderate centrilobular emphysema, most pronounced in the lung apices.  No acute consolidative airspace disease. No pleural effusions. 5 mm right lower lobe nodule (image 89 of  series 6), unchanged compared to prior study from 05/07/2010; this can be considered benign and does not require image followup). No other larger suspicious appearing pulmonary nodules or masses are identified on today's examination.  Upper Abdomen: Unremarkable.  Musculoskeletal: There are no aggressive appearing lytic or blastic lesions noted in the visualized portions of the skeleton.  Review of the MIP images confirms the above findings.  IMPRESSION: 1. Limited examination secondary to patient motion demonstrated no evidence of clinically significant central, lobar or segmental sized pulmonary embolism. Smaller subsegmental sized pulmonary embolism cannot be entirely excluded. 2. No acute findings in the thorax to account for the patient's symptoms. 3. Atherosclerosis, including left main and 3 vessel coronary artery disease. Assessment for potential risk factor modification, dietary therapy or pharmacologic therapy may be warranted, if clinically indicated. 4. Moderate centrilobular emphysema. 5. Additional incidental findings, as above.   Electronically Signed   By: Vinnie Langton M.D.   On: 11/28/2013 14:15   Mr Brain Wo Contrast  12/14/2013   CLINICAL DATA:  Aphasia. The examination had to be discontinued prior to completion due to patient combativeness.  EXAM: MRI HEAD WITHOUT CONTRAST  TECHNIQUE: Multiplanar, multiecho pulse sequences of the brain and surrounding structures were obtained without intravenous contrast.  COMPARISON:  CT HEAD W/O CM dated 12/14/2013; MR HEAD W/O CM dated 11/29/2013  FINDINGS: The diffusion-weighted images demonstrate no evidence for acute or subacute infarction. Diffuse white matter changes are again noted.  IMPRESSION: 1. No acute abnormality. 2. The study was discontinued after the diffusion-weighted imaging as the patient was combative and noncooperative. 3. Diffuse white matter changes are again noted.   Electronically Signed   By: Lawrence Santiago M.D.   On: 12/14/2013  21:04   Mr Brain Wo Contrast  11/29/2013   CLINICAL DATA:  Confusion.  Hypertension.  COPD.  EXAM: MRI HEAD WITHOUT CONTRAST  TECHNIQUE: Multiplanar, multiecho pulse sequences of the brain and surrounding structures were obtained without intravenous contrast.  COMPARISON:  CT HEAD W/O CM dated 11/28/2013; MR HEAD W/O CM dated 12/01/2011; MR MRA HEAD W/O CM dated 12/01/2011  FINDINGS: No evidence for acute infarction, hemorrhage, mass lesion, hydrocephalus, or extra-axial fluid. Advanced atrophy. Extensive chronic microvascular ischemic change throughout the periventricular and subcortical white matter. No foci of chronic hemorrhage. Pituitary, pineal, and cerebellar tonsils unremarkable. No upper cervical lesions. Visualized calvarium, skull base, and upper cervical osseous structures unremarkable. Scalp and extracranial soft tissues, orbits, and sinuses show no acute process. Chronic right mastoid fluid likely effusion was demonstrated in 2013.  Left parotid remains atrophic as compared to the right. This appearance was present in 2013 and is stable.  IMPRESSION: Advanced atrophy with extensive small vessel disease. Cyst similar to priors. No acute intracranial findings.  Atrophic left parotid gland.   Electronically Signed   By: Rolla Flatten M.D.   On: 11/29/2013 15:49   US Renal  12/03/2013   CLINICAL DATA:  Assess for perinephric abscess.  EXAM: RENAL/URINARY TRACT ULTRASOUND COMPLETE  COMPARISON:  07/09/2009  FINDINGS: Right Kidney:  Length: 12.5 cm. Echogenicity within normal limits. No mass or hydronephrosis visualized.  Left Kidney:  Length: 3.8 cm. Echogenicity within normal limits. No mass or hydronephrosis visualized. Cyst within the inferior pole is again noted measuring 2.8 x 3.2 x 3.4 cm. This contains some diffuse low level internal echoes. Increased through transmission is identified.  Bladder:  Appears  normal for degree of bladder distention.  IMPRESSION: 1. No evidence for renal abscess. 2. Stable  cyst within the inferior pole of the left kidney.   Electronically Signed   By: Kerby Moors M.D.   On: 12/03/2013 11:23   Dg Chest Port 1 View  12/07/2013   CLINICAL DATA Hypoxia  EXAM PORTABLE CHEST - 1 VIEW  COMPARISON December 05, 2013  FINDINGS There is underlying emphysematous change. There is no edema or consolidation. The heart is upper normal in size. The pulmonary vascularity reflects underlying emphysema. No adenopathy. There is arthropathy in both shoulders.  IMPRESSION Underlying emphysematous change.  No edema or consolidation.  SIGNATURE  Electronically Signed   By: Lowella Grip M.D.   On: 12/07/2013 07:15   Dg Chest Port 1 View  12/05/2013   CLINICAL DATA:  Shortness of breath.  EXAM: PORTABLE CHEST - 1 VIEW  COMPARISON:  DG CHEST 1V PORT dated 12/04/2013; DG CHEST 2 VIEW dated 12/02/2013; CT ANGIO CHEST W/CM &/OR WO/CM dated 11/28/2013; DG CHEST 2 VIEW dated 11/23/2013  FINDINGS: The mediastinum and hilar structures are normal. Stable cardiomegaly. Normal pulmonary vascularity. Mild interstitial prominence noted in the lung bases. Active pneumonitis cannot be excluded. No pleural effusion or pneumothorax. No acute osseous abnormality.  IMPRESSION: Mild interstitial prominence within the lung bases with low lung volumes. Active pneumonitis cannot be excluded.   Electronically Signed   By: Marcello Moores  Register   On: 12/05/2013 07:37   Dg Chest Port 1 View  12/04/2013   CLINICAL DATA:  Assess infiltrates, edema  EXAM: PORTABLE CHEST - 1 VIEW  COMPARISON:  12/03/2013  FINDINGS: Unchanged interstitial coarsening and hyperinflation. No edema or consolidation. Normal heart size. No visible effusion. No pneumothorax.  IMPRESSION: 1. No edema or consolidation. 2. COPD.   Electronically Signed   By: Jorje Guild M.D.   On: 12/04/2013 05:32   Dg Chest Port 1 View  12/03/2013   CLINICAL DATA:  Respiratory distress  EXAM: PORTABLE CHEST - 1 VIEW  COMPARISON:  12/02/2013  FINDINGS: The heart size appears  normal. There is no pleural effusion or edema identified. No airspace consolidation.  IMPRESSION: 1. Stable radiograph of the chest. 2. No pneumonia or heart failure identified.   Electronically Signed   By: Kerby Moors M.D.   On: 12/03/2013 08:54   Dg Chest Portable 1 View  11/28/2013   CLINICAL DATA:  Loss of consciousness  EXAM: PORTABLE CHEST - 1 VIEW  COMPARISON:  11/23/2013  FINDINGS: Chronic interstitial coarsening. No edema or consolidation. No visible effusion or pneumothorax. Normal heart size for technique.  IMPRESSION: No edema or consolidation.   Electronically Signed   By: Jorje Guild M.D.   On: 11/28/2013 02:56   Mr Jodene Nam Head/brain Wo Cm  12/16/2013   CLINICAL DATA:  Stroke  EXAM: MRI HEAD WITHOUT CONTRAST  MRA HEAD WITHOUT CONTRAST  TECHNIQUE: Multiplanar, multiecho pulse sequences of the brain and surrounding structures were obtained without intravenous contrast. Angiographic images of the head were obtained using MRA technique without contrast.  COMPARISON:  12/14/2013.  11/29/2013, MRI 12/01/2011  FINDINGS: MRI HEAD FINDINGS  There is mild motion degradation. Diffusion imaging does not show any acute or subacute infarction. The brainstem is normal. There is cerebellar atrophy. The cerebral hemispheres show old cortical and subcortical ischemic change in the right occipital lobe. Extensive areas of abnormal signal throughout the hemispheric white matter. These are consistent with old small vessel insults. Mild chronic small-vessel changes noted in  the thalami and basal ganglia. No large vessel territory infarction. No mass lesion, hemorrhage, hydrocephalus or extra-axial collection. Paranasal sinuses do not show any significant inflammation. There is fluid throughout the right mastoid air cells.  MRA HEAD FINDINGS  Both internal carotid arteries are patent into the brain. There is atherosclerotic irregularity and narrowing in both carotid siphon regions. The left internal carotid artery  supplies the left middle cerebral artery and both anterior cerebral arteries. The right anterior cerebral artery is severely diseased and does not show demonstrable flow on today's examination. On the previous study of March 2013, there was minimal flow a in this vessel. Both posterior cerebral arteries take a fetal origin from the anterior circulation.  Right vertebral artery is the dominant vessel widely patent to the basilar. There is some atherosclerotic irregularity of the distal right vertebral artery unchanged since the previous study. The left vertebral artery is a small vessel with extensive atherosclerotic change. This vessel all may give minimal supply to the basilar. No basilar stenosis. Superior cerebellar and posterior cerebral arteries are patent bilaterally. There is atherosclerotic irregularity of the more distal branch vessels.  IMPRESSION: No acute infarction. Extensive chronic small vessel changes throughout the brain. 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  Atherosclerotic narrowing and irregularity of both carotid siphon regions. Severely diseased right A1 segment which does not definitely show flow today. Both the anterior cerebral arteries are appear well supplied from the left carotid system.  Severely diseased left vertebral artery. The right vertebral artery is dominant and widely patent to the basilar. Posterior circulation branch vessels are patent but show atherosclerotic irregularity.   Electronically Signed   By: Nelson Chimes M.D.   On: 12/16/2013 15:53         Subjective: Patient is feeling better. Denies any fevers, chills, chest pain and shortness of breath, nausea, vomiting, diarrhea, abdominal pain, dysuria.  Objective: Filed Vitals:   12/17/13 1747 12/17/13 2135 12/18/13 0153 12/18/13 0445  BP: 125/52 136/52 148/70 119/47  Pulse: 71 74 74 72  Temp: 97.8 F (36.6 C) 98.2 F (36.8 C) 98.1 F (36.7 C) 98.4 F (36.9 C)  TempSrc: Oral Other (Comment) Oral  Oral  Resp: 20 20 20 20   Height:      Weight:      SpO2: 99% 99% 99% 99%    Intake/Output Summary (Last 24 hours) at 12/18/13 1519 Last data filed at 12/18/13 0019  Gross per 24 hour  Intake      0 ml  Output    200 ml  Net   -200 ml   Weight change:  Exam:   General:  Pt is alert, follows commands appropriately, not in acute distress  HEENT: No icterus, No thrush,  [place]/AT  Cardiovascular: RRR, S1/S2, no rubs, no gallops  Respiratory: Diminished breath sounds at the bases. Right basilar crackles. Left clear to auscultation.  Abdomen: Soft/+BS, non tender, non distended, no guarding  Extremities: No edema, No lymphangitis, No petechiae, No rashes, no synovitis  Data Reviewed: Basic Metabolic Panel:  Recent Labs Lab 12/14/13 1435 12/15/13 0635 12/16/13 0240 12/17/13 0558 12/18/13 0540  NA 136* 130* 137 139 138  K 4.1 3.4* 3.8 3.8 3.8  CL 95* 92* 100 100 98  CO2 27 24 25 26 26   GLUCOSE 86 87 206* 112* 148*  BUN 20 14 13 10 9   CREATININE 0.72 0.70 0.80 0.86 0.83  CALCIUM 9.1 8.2* 8.3* 8.7 8.5  MG  --   --  1.7  --   --  Liver Function Tests:  Recent Labs Lab 12/14/13 1435  AST 20  ALT 45  ALKPHOS 62  BILITOT 1.3*  PROT 5.9*  ALBUMIN 3.2*   No results found for this basename: LIPASE, AMYLASE,  in the last 168 hours  Recent Labs Lab 12/14/13 2147  AMMONIA 17   CBC:  Recent Labs Lab 12/14/13 1435 12/15/13 0635 12/16/13 0240 12/17/13 0558  WBC 15.4* 14.0* 9.1 9.5  NEUTROABS 11.0*  --   --   --   HGB 15.9 14.7 13.5 14.3  HCT 45.7 42.6 39.6 41.7  MCV 86.6 86.4 86.8 86.9  PLT 148* 141* 131* 141*   Cardiac Enzymes: No results found for this basename: CKTOTAL, CKMB, CKMBINDEX, TROPONINI,  in the last 168 hours BNP: No components found with this basename: POCBNP,  CBG:  Recent Labs Lab 12/17/13 2222 12/18/13 0350 12/18/13 0645 12/18/13 1121 12/18/13 1154  GLUCAP 265* 151* 130* 161* 167*    Recent Results (from the past 240  hour(s))  CULTURE, BLOOD (ROUTINE X 2)     Status: None   Collection Time    12/14/13  3:03 PM      Result Value Ref Range Status   Specimen Description BLOOD HAND LEFT   Final   Special Requests BOTTLES DRAWN AEROBIC AND ANAEROBIC 5CC   Final   Culture  Setup Time     Final   Value: 12/14/2013 19:29     Performed at [facility]   Culture     Final   Value:        BLOOD CULTURE RECEIVED NO GROWTH TO DATE CULTURE WILL BE HELD FOR 5 DAYS BEFORE ISSUING A FINAL NEGATIVE REPORT     Performed at [facility]   Report Status PENDING   Incomplete  CULTURE, BLOOD (ROUTINE X 2)     Status: None   Collection Time    12/14/13  3:03 PM      Result Value Ref Range Status   Specimen Description BLOOD HAND RIGHT   Final   Special Requests BOTTLES DRAWN AEROBIC AND ANAEROBIC 5CCBLUE 1CCRED   Final   Culture  Setup Time     Final   Value: 12/14/2013 19:29     Performed at [facility]   Culture     Final   Value:        BLOOD CULTURE RECEIVED NO GROWTH TO DATE CULTURE WILL BE HELD FOR 5 DAYS BEFORE ISSUING A FINAL NEGATIVE REPORT     Performed at [facility]   Report Status PENDING   Incomplete  URINE CULTURE     Status: None   Collection Time    12/14/13  4:52 PM      Result Value Ref Range Status   Specimen Description URINE, CATHETERIZED   Final   Special Requests NONE   Final   Culture  Setup Time     Final   Value: 12/14/2013 17:12     Performed at [place]     Final   Value: NO GROWTH     Performed at [facility]   Culture     Final   Value: NO GROWTH     Performed at [facility]   Report Status 12/15/2013 FINAL   Final     Scheduled Meds: . aspirin  81 mg Oral Daily  . atorvastatin  10 mg Oral q1800  . enoxaparin (LOVENOX) injection  40 mg Subcutaneous  Q24H  . insulin aspart  0-9 Units Subcutaneous TID WC  . insulin glargine  25 Units Subcutaneous QHS  . levETIRAcetam  500 mg Oral BID  . nicotine   14 mg Transdermal Daily  . piperacillin-tazobactam (ZOSYN)  IV  3.375 g Intravenous 3 times per day  . sodium chloride  500 mL Intravenous Once  . sodium chloride  3 mL Intravenous Q12H  . tiotropium  18 mcg Inhalation Daily   Continuous Infusions: . sodium chloride 20 mL/hr (12/17/13 1749)     Dulcie Gammon, DO  [facility] Pager [phone]  If 7PM-7AM, please contact night-coverage [email or web address] Password [facility] 12/18/2013, 3:19 PM   LOS: 4 days

## 2013-12-19 DIAGNOSIS — G92 Toxic encephalopathy: Secondary | ICD-10-CM

## 2013-12-19 DIAGNOSIS — G929 Unspecified toxic encephalopathy: Secondary | ICD-10-CM

## 2013-12-19 DIAGNOSIS — R269 Unspecified abnormalities of gait and mobility: Secondary | ICD-10-CM

## 2013-12-19 LAB — GLUCOSE, CAPILLARY
Glucose-Capillary: 223 mg/dL — ABNORMAL HIGH (ref 70–99)
Glucose-Capillary: 160 mg/dL — ABNORMAL HIGH (ref 70–99)
Glucose-Capillary: 116 mg/dL — ABNORMAL HIGH (ref 70–99)
Glucose-Capillary: 124 mg/dL — ABNORMAL HIGH (ref 70–99)
Glucose-Capillary: 208 mg/dL — ABNORMAL HIGH (ref 70–99)
Glucose-Capillary: 217 mg/dL — ABNORMAL HIGH (ref 70–99)

## 2013-12-19 MED ORDER — LEVETIRACETAM 500 MG PO TABS: 500.0000 mg | ORAL_TABLET | Freq: Two times a day (BID) | ORAL | Status: DC

## 2013-12-19 MED ORDER — LEVOFLOXACIN 750 MG PO TABS
750.0000 mg | ORAL_TABLET | Freq: Every day | ORAL | Status: DC
  Administered 2013-12-19: 750 mg via ORAL
  Filled 2013-12-19: qty 1

## 2013-12-19 MED ORDER — LEVOFLOXACIN 750 MG PO TABS: 750.0000 mg | ORAL_TABLET | Freq: Every day | ORAL | Status: DC

## 2013-12-19 MED ORDER — INSULIN GLARGINE 100 UNITS/ML SOLOSTAR PEN: 30.0000 [IU] | PEN_INJECTOR | Freq: Every day | SUBCUTANEOUS | Status: DC

## 2013-12-19 NOTE — Progress Notes (Signed)
I met with pt at bedside to explain why he did not need an inpt rehab admission. He is aware that he will be admitted to [facility]. Please call me with any questions. [phone] 

## 2013-12-19 NOTE — Consult Note (Signed)
Physical Medicine and Rehabilitation Consult Reason for Consult: Acute cephalopathy Referring Physician: [facility]   HPI: Jonathan [name] is a 73 y.o. right-handed male with history of diabetes mellitus or peripheral neuropathy, hypertension, CVA and COPD and multiple recent admissions for COPD exacerbations. Presented 12/14/2013 with altered mental status as well as agitation with dysarthria. MRI of the brain with no acute abnormalities. EEG was slow temporal activity. Maintained on Keppra for seizure prophylaxis per neurology services. Patient remains on chronic oxygen for COPD. Urine culture no growth. Ammonia level is within normal limits. Blood cultures no growth. Neurology service followup maintain on aspirin therapy as well as subcutaneous Lovenox for DVT prophylaxis. Neurology service followup suspect acute encephalopathy due to multiple factors. Physical therapy evaluation completed with recommendations for physical medicine rehabilitation consult.   Review of Systems  Respiratory: Positive for shortness of breath.   Gastrointestinal: Positive for constipation.  Musculoskeletal: Positive for joint pain and myalgias.  Neurological: Positive for weakness.  All other systems reviewed and are negative.   Past Medical History  Diagnosis Date  . HTN (hypertension)   . HLD (hyperlipidemia)   . Pulmonary nodule, right     lower lobe  . COPD (chronic obstructive pulmonary disease)   . Rotator cuff injury     right  . Frozen shoulder   . Shoulder pain   . Hyperkalemia   . Dermatitis seborrheica   . Other diseases of lung, not elsewhere classified   . Kidney stone   . Allergic rhinitis   . ED (erectile dysfunction)   . Diabetes mellitus type II   . Asthma   . Tobacco abuse   . Stroke     pt states "last year"   Past Surgical History  Procedure Laterality Date  . Cervical discectomy  1998  . Ett  11/1994  . Tm repair    . Tee without cardioversion  12/03/2011   Procedure: TRANSESOPHAGEAL ECHOCARDIOGRAM (TEE);  Surgeon: Lelon Perla, MD;  Location: [facility] ENDOSCOPY;  Service: Cardiovascular;  Laterality: N/A;   Family History  Problem Relation Age of Onset  . Asthma Sister   . Emphysema Sister    Social History:  reports that he has been smoking Cigarettes.  He has been smoking about 0.20 packs per day. He has never used smokeless tobacco. He reports that he does not drink alcohol or use illicit drugs. Allergies:  Allergies  Allergen Reactions  . Amoxicillin-Pot Clavulanate Other (See Comments)    Severe stomach pain.   . Quinapril Hcl     REACTION: back pain  . Valsartan     REACTION: back pain  . Varenicline Tartrate     REACTION: AMS, hallucinations, night mares   Medications Prior to Admission  Medication Sig Dispense Refill  . albuterol (PROVENTIL) (2.5 MG/3ML) 0.083% nebulizer solution Take 3 mLs (2.5 mg total) by nebulization 2 (two) times daily as needed for shortness of breath.  75 mL  0  . albuterol (VENTOLIN HFA) 108 (90 BASE) MCG/ACT inhaler Inhale 2 puffs into the lungs every 6 (six) hours as needed for wheezing.  1 Inhaler  11  . amLODipine (NORVASC) 5 MG tablet Take 5 mg by mouth daily.      [name] aspirin 81 MG tablet Take 1 tablet (81 mg total) by mouth daily.  30 tablet  0  . atorvastatin (LIPITOR) 10 MG tablet Take 10 mg by mouth daily.      [name] glipiZIDE (GLIPIZIDE XL) 10 MG 24  hr tablet Take 1 tablet (10 mg total) by mouth daily.  90 tablet  0  . guaiFENesin (MUCINEX) 600 MG 12 hr tablet Take 1 tablet (600 mg total) by mouth 2 (two) times daily.  15 tablet  0  . insulin glargine (LANTUS) 100 units/mL SOLN Inject 40 Units into the skin daily at 8 pm.       . insulin lispro (HUMALOG) 100 UNIT/ML KiwkPen Inject 0-20 Units into the skin 3 (three) times daily with meals.  15 mL  11  . metFORMIN (GLUCOPHAGE) 1000 MG tablet Take 500-1,000 mg by mouth See admin instructions. Take 1 tablet in the morning, 0.5 tablet midday and 1 tablet in  the evening      . tiotropium (SPIRIVA HANDIHALER) 18 MCG inhalation capsule Place 1 capsule (18 mcg total) into inhaler and inhale daily.  30 capsule  0  . predniSONE (DELTASONE) 10 MG tablet Take 10 mg by mouth See admin instructions. Take 3 tabs daily x 1 day, then 2 tabs daily x 2 days, then 1 tab daily x2 days then stop. #9. Completed course of medication about two weeks ago as of 12-14-13        Home: [place] expects to be discharged to:: Inpatient rehab Living Arrangements: Spouse/significant other Available Help at Discharge: Family Type of Home: Mobile home Home Access: Stairs to enter Entrance Stairs-Number of Steps: 3 Entrance Stairs-Rails: Can reach both Home Layout: One level Home Equipment: [place] - 4 wheels Additional Comments: pt was on O2 after last couple hospitalizations.    Functional History: Prior Function Comments: Uses cane at times. Reports one fall several weeks ago Functional Status:  Mobility:     Ambulation/Gait Ambulation Distance (Feet): 300 Feet General Gait Details: Patient with good control. initially impulsive but with cueing was able to ambulate much safer with more control     ADL: ADL Eating/Feeding: Supervision/safety Where Assessed - Eating/Feeding: Chair Grooming: Wash/dry hands;Wash/dry face;Teeth care;Denture care;Supervision/safety Where Assessed - Grooming: Unsupported sitting Upper Body Bathing: Minimal assistance Where Assessed - Upper Body Bathing: Unsupported sitting Lower Body Bathing: Minimal assistance Where Assessed - Lower Body Bathing: Supported sit to stand Upper Body Dressing: Minimal assistance Where Assessed - Upper Body Dressing: Unsupported sitting Lower Body Dressing: Minimal assistance Where Assessed - Lower Body Dressing: Supported sit to stand Toilet Transfer: Minimal assistance Toilet Transfer Method: Sit to stand;Stand pivot Science writer: Bedside commode Transfers/Ambulation  Related to ADLs: Min A for safety ADL Comments: Pt able to don/doff socks with dyspnea 2/4.  Pt with limited participation as he spontaneously returned to supine  Cognition: Cognition Overall Cognitive Status: Difficult to assess Orientation Level: Disoriented to situation;Oriented to person;Oriented to place;Disoriented to time Cognition Arousal/Alertness: Awake/alert Behavior During Therapy: WFL for tasks assessed/performed Overall Cognitive Status: Difficult to assess Area of Impairment: Safety/judgement Orientation Level: Situation Current Attention Level: Sustained Safety/Judgement: Decreased awareness of safety Problem Solving: Requires verbal cues;Requires tactile cues General Comments: Patient continues to be somewhat impulsive Difficult to assess due to: Level of arousal  Blood pressure 126/56, pulse 69, temperature 98.4 F (36.9 C), temperature source Oral, resp. rate 20, height 5\' 8"  (1.727 m), weight 86.183 kg (190 lb), SpO2 99.00%. Physical Exam  Constitutional:  73 year old male  Eyes: EOM are normal.  No nystagmus  Neck: Normal range of motion. Neck supple. No thyromegaly present.  Cardiovascular: Normal rate and regular rhythm.   Respiratory:  Decreased breath sounds at the bases.  GI: Soft. Bowel sounds are normal.  He exhibits no distension.  Neurological:  Patient is arousable and makes good eye contact with examiner. He was able to provide his date of birth and age. He did state that he was in the hospital. Is a poor medical historian. Follows simple commands in no acute distress. Strength near 5/5. Reasonable balance. No sensory deficits  Skin: Skin is warm and dry.  Psychiatric: He has a normal mood and affect. His behavior is normal.    Results for orders placed during the hospital encounter of 12/14/13 (from the past 24 hour(s))  BASIC METABOLIC PANEL     Status: Abnormal   Collection Time    12/18/13  5:40 AM      Result Value Ref Range   Sodium 138   137 - 147 mEq/L   Potassium 3.8  3.7 - 5.3 mEq/L   Chloride 98  96 - 112 mEq/L   CO2 26  19 - 32 mEq/L   Glucose, Bld 148 (*) 70 - 99 mg/dL   BUN 9  6 - 23 mg/dL   Creatinine, Ser 0.83  0.50 - 1.35 mg/dL   Calcium 8.5  8.4 - 10.5 mg/dL   GFR calc non Af Amer 85 (*) >90 mL/min   GFR calc Af Amer >90  >90 mL/min  GLUCOSE, CAPILLARY     Status: Abnormal   Collection Time    12/18/13  6:45 AM      Result Value Ref Range   Glucose-Capillary 130 (*) 70 - 99 mg/dL  GLUCOSE, CAPILLARY     Status: Abnormal   Collection Time    12/18/13 11:21 AM      Result Value Ref Range   Glucose-Capillary 161 (*) 70 - 99 mg/dL  GLUCOSE, CAPILLARY     Status: Abnormal   Collection Time    12/18/13 11:54 AM      Result Value Ref Range   Glucose-Capillary 167 (*) 70 - 99 mg/dL  GLUCOSE, CAPILLARY     Status: Abnormal   Collection Time    12/18/13  4:01 PM      Result Value Ref Range   Glucose-Capillary 207 (*) 70 - 99 mg/dL   Comment 1 Documented in Chart     Comment 2 Notify RN    GLUCOSE, CAPILLARY     Status: Abnormal   Collection Time    12/18/13  8:55 PM      Result Value Ref Range   Glucose-Capillary 297 (*) 70 - 99 mg/dL  GLUCOSE, CAPILLARY     Status: Abnormal   Collection Time    12/19/13 12:53 AM      Result Value Ref Range   Glucose-Capillary 223 (*) 70 - 99 mg/dL   Comment 1 Documented in Chart     Comment 2 Notify RN    GLUCOSE, CAPILLARY     Status: Abnormal   Collection Time    12/19/13  4:53 AM      Result Value Ref Range   Glucose-Capillary 160 (*) 70 - 99 mg/dL   Comment 1 Documented in Chart     Comment 2 Notify RN     No results found.  Assessment/Plan: Diagnosis: resolving encephalopathy 1. Does the need for close, 24 hr/day medical supervision in concert with the patient's rehab needs make it unreasonable for this patient to be served in a less intensive setting? No 2. Co-Morbidities requiring supervision/potential complications: see above 3. Due to bladder  management, bowel management, skin/wound care and disease management, does the patient  require 24 hr/day rehab nursing? No 4. Does the patient require coordinated care of a physician, rehab nurse, PT, OT to address physical and functional deficits in the context of the above medical diagnosis(es)? No Addressing deficits in the following areas: balance, endurance, locomotion and strength 5. Can the patient actively participate in an intensive therapy program of at least 3 hrs of therapy per day at least 5 days per week? No 6. The potential for patient to make measurable gains while on inpatient rehab is poor 7. Anticipated functional outcomes upon discharge from inpatient rehab are n/a with PT, n/a with OT, n/a with SLP. 8. Estimated rehab length of stay to reach the above functional goals is: n/a 9. Does the patient have adequate social supports to accommodate these discharge functional goals? Yes 10. Anticipated D/C setting: Home 11. Anticipated post D/C treatments: [facility] therapy 12. Overall Rehab/Functional Prognosis: excellent  RECOMMENDATIONS: This patient's condition is appropriate for continued rehabilitative care in the following setting: [facility] Therapy Patient has agreed to participate in recommended program. Yes Note that insurance prior authorization may be required for reimbursement for recommended care.  Comment: Daughter can potentially assist at home until companion returns next week.   Meredith Staggers, MD, [facility]     12/19/2013

## 2013-12-19 NOTE — Progress Notes (Addendum)
Inpatient Diabetes Program Recommendations  AACE/ADA: New Consensus Statement on Inpatient Glycemic Control (2013)  Target Ranges:  Prepandial:   less than 140 mg/dL      Peak postprandial:   less than 180 mg/dL (1-2 hours)      Critically ill patients:  140 - 180 mg/dL  Results for HULET, EHRMANN (MRN [id number]) as of 12/19/2013 13:47  Ref. Range 12/19/2013 07:56 12/19/2013 12:06  Glucose-Capillary Latest Range: 70-99 mg/dL 124 (H) 208 (H)   Consider adding Novolog 4 units TID meal coverage for elevated postprandial CBGs.  ADD: being dc'd today. Thank you  Raoul Pitch BSN, RN,CDE Inpatient Diabetes Coordinator [phone] (team pager)

## 2013-12-19 NOTE — Discharge Summary (Signed)
Physician Discharge Summary  Jonathan [name] [id number] DOB: 1941/04/19 DOA: 12/14/2013  PCP: Loura Pardon, MD  Admit date: 12/14/2013 Discharge date: 12/19/2013  Recommendations for Outpatient Follow-up:  1. Pt will need to follow up with PCP in 2 weeks post discharge 2. Please obtain BMP to evaluate electrolytes and kidney function 3. Please also check CBC to evaluate Hg and Hct levels   Discharge Diagnoses:  Acute encephalopathy/dysarthria  -Likely due to a combination of aspiration pneumonitis and seizure  -Improving-->back to baseline  -MRI brain negative for acute infarct  -11/28/2013 echocardiogram EF 55-60%  -Ammonia 17  -11/28/2013 TSH 0.447  -Urinalysis negative for pyuria  -Urine drug screen negative  HCAP/Aspiration pneumonia  -Blood cultures x2 sets--remains negative  -Initially started empiric vancomycin and zosyn  -Discontinue vancomycin 12/17/2013  -Patient failed RN swallow evaluation 12/14/2013  - evaluated by speech therapy --> dysphagia 3 diet  -12/19/2013--> discontinue Zosyn--> start levofloxacin -Levofloxacin 750 mg daily x2 additional days to finish a seven-day course of antibiotics--his next dose of levofloxacin will be due on 12/20/2013 Abnormal EEG  -Slow left temporal activity  -Appreciate neurology input  -Continue Keppra per neurology recommendations  -repeat MRI brain--no acute infarct. 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  -There was concern for seizure as a cause of the patient's encephalopathy resulting in aspiration pneumonitis  -as a result, the patient was started on Keppra 500 mg twice a day   COPD  -50-pack-year history  -Stable  -On 2 L nasal cannula at home  -Continue Spiriva  Diabetes mellitus type 2, uncontrolled  -11/28/2013 hemoglobin A1c 8.4  -Increase Lantus to 30 units at bedtime  -The patient should continue to check his sugars before meals and at bedtime keep a glycemic log--he should take  his glycemic log to his primary care physician who will make further adjustments to his diabetic regimen  -The patient will restart metformin and glipizide in outpatient setting  Hyperlipidemia  -Continue statin  Hypertension  -Hold amlodipine as the patient's blood pressure is soft  -Blood pressure gradually increased--patient will restart amlodipine 5 mg daily after discharge  Chronic respiratory failure  -The patient is maintained on 2 L nasal cannula at home -Patient remained stable on 2 L -  Hyponatremia  -Likely volume depletion  -IVF-->improving--> saline lock IV fluids  Deconditioning  -PT recommend CIR  -Consult CIR--they did not feel the patient was appropriate  Family Communication: Daughter updated at bedside  Disposition Plan: SNF Antibiotics:  Vancomycin 12/15/13>>> 12/17/13  Zosyn 12/15/13>>> 12/19/2013   Discharge Condition: Stable  Disposition:  skilled nursing facility  Diet: Carbohydrate modified  Wt Readings from Last 3 Encounters:  12/14/13 86.183 kg (190 lb)  12/05/13 81.421 kg (179 lb 8 oz)  11/26/13 85 kg (187 lb 6.3 oz)    History of present illness:   73 y.o. male who presents to the ED, with confusion, and agitation and slurring of his speech. He is unable to give a history but his family is at the bedside and he began to have slurring of his speech an was not making any sense when the physical therapist came to his home to work with him today. He was sent to the ED for evaluation. He was last admitted to the hospital from 11/28/13 Until 12/08/2013 due to Acute on Chronic respiratory failure and had been sent home on 2 liters NCO2. In the ED, he was found to have an ABG of pH =7.5/ pCO2= 34 / pO2=127 / HCO3=28.5 /  and O2 sat 99% on 2 liters NCO2. Also a CT scan of the head was performed which was negative for acute findings, and he was referred for medical admission.  The patient had a recent admission on 11/28/2013 where he was intubated for respiratory  failure. The patient has a 50-pack-year history of tobacco use. Is found to have WBC 17.5 at the time of admission. The patient was started on antibiotics after blood cultures were obtained. Neurology was consulted. He was started on intravenous fluids for his hyponatremia. His hyponatremia improved. His WBC is improved. MRI of the brain was negative for acute infarction. The patient gradually improved with antibiotics and fluids. His mentation improved rapidly 24 hours after initiating fluids and IV antibiotics. EEG showed temporal slowing. He was started on Keppra by neurology. He tolerated it well. He participated with physical therapy. They recommended 24-hour supervision. The patient and family agree to skilled nursing facility.  Consultants: Neurology  PM&R  Discharge Exam: Filed Vitals:   12/19/13 0922  BP: 130/68  Pulse: 83  Temp: 97.3 F (36.3 C)  Resp: 20   Filed Vitals:   12/18/13 2148 12/19/13 0100 12/19/13 0554 12/19/13 0922  BP: 136/49 126/56 123/60 130/68  Pulse: 75 69 67 83  Temp: 98.5 F (36.9 C) 98.4 F (36.9 C) 98.1 F (36.7 C) 97.3 F (36.3 C)  TempSrc: Oral Oral Oral Oral  Resp: 20 20 20 20   Height:      Weight:      SpO2: 99% 99% 99% 98%   General: A&O x 3, NAD, pleasant, cooperative Cardiovascular: RRR, no rub, no gallop, no S3 Respiratory: bibasilar crackles, recurrent on left. No wheezing. Good air movement.  Abdomen:soft, nontender, nondistended, positive bowel sounds Extremities: No edema, No lymphangitis, no petechiae  Discharge Instructions      Discharge Orders   Future Appointments Provider Department Dept Phone   12/28/2013 3:15 PM Abner Greenspan, MD [place] [facility] [place] [phone]   Future Orders Complete By Expires   Diet - low sodium heart healthy  As directed    Increase activity slowly  As directed        Medication List    STOP taking these medications       predniSONE 10 MG tablet  Commonly known as:  DELTASONE       TAKE these medications       albuterol 108 (90 BASE) MCG/ACT inhaler  Commonly known as:  VENTOLIN HFA  Inhale 2 puffs into the lungs every 6 (six) hours as needed for wheezing.     albuterol (2.5 MG/3ML) 0.083% nebulizer solution  Commonly known as:  PROVENTIL  Take 3 mLs (2.5 mg total) by nebulization 2 (two) times daily as needed for shortness of breath.     amLODipine 5 MG tablet  Commonly known as:  NORVASC  Take 5 mg by mouth daily.     aspirin 81 MG tablet  Take 1 tablet (81 mg total) by mouth daily.     atorvastatin 10 MG tablet  Commonly known as:  LIPITOR  Take 10 mg by mouth daily.     glipiZIDE 10 MG 24 hr tablet  Commonly known as:  GLIPIZIDE XL  Take 1 tablet (10 mg total) by mouth daily.     guaiFENesin 600 MG 12 hr tablet  Commonly known as:  MUCINEX  Take 1 tablet (600 mg total) by mouth 2 (two) times daily.     insulin glargine 100 units/mL Soln  Commonly known as:  LANTUS  Inject 30 Units into the skin daily at 8 pm.     insulin lispro 100 UNIT/ML KiwkPen  Commonly known as:  HUMALOG  Inject 0-20 Units into the skin 3 (three) times daily with meals.     levETIRAcetam 500 MG tablet  Commonly known as:  KEPPRA  Take 1 tablet (500 mg total) by mouth 2 (two) times daily.     levofloxacin 750 MG tablet  Commonly known as:  LEVAQUIN  Take 1 tablet (750 mg total) by mouth daily. Start 12/20/13     metFORMIN 1000 MG tablet  Commonly known as:  GLUCOPHAGE  Take 500-1,000 mg by mouth See admin instructions. Take 1 tablet in the morning, 0.5 tablet midday and 1 tablet in the evening     tiotropium 18 MCG inhalation capsule  Commonly known as:  SPIRIVA HANDIHALER  Place 1 capsule (18 mcg total) into inhaler and inhale daily.         The results of significant diagnostics from this hospitalization (including imaging, microbiology, ancillary and laboratory) are listed below for reference.    Significant Diagnostic Studies: Dg Chest 2  View  12/14/2013   CLINICAL DATA:  ALTERED MENTAL STATUS  EXAM: CHEST  2 VIEW  COMPARISON:  DG CHEST 1V PORT dated 12/07/2013  FINDINGS: Low lung volumes. There is increased density projects in the lung bases right greater than left. Cardiac silhouettes within normal limits. Calcified lymph nodes project within the infrahilar region on the left. The osseous structures demonstrate no acute abnormalities.  IMPRESSION: Low lung volumes which accentuates lung findings. There are areas of increased density within the lung bases differential considerations are atelectasis versus infiltrates. Right greater than left.   Electronically Signed   By: Margaree Mackintosh M.D.   On: 12/14/2013 16:07   Dg Chest 2 View  12/02/2013   CLINICAL DATA:  Shortness of Breath  EXAM: CHEST  2 VIEW  COMPARISON:  Chest radiograph November 28, 2013 and chest CT November 28, 2013  FINDINGS: There is a degree of underlying emphysematous change. There is no edema or consolidation. Heart is borderline enlarged with normal pulmonary vascularity. No adenopathy. No bone lesions.  IMPRESSION: No edema or consolidation. Evidence of a degree of underlying emphysematous change. Mild cardiac prominence.   Electronically Signed   By: Lowella Grip M.D.   On: 12/02/2013 08:20   Dg Chest 2 View  11/23/2013   CLINICAL DATA:  Shortness of breath and cough  EXAM: CHEST  2 VIEW  COMPARISON:  12/02/2011  FINDINGS: Mild interstitial coarsening which appears chronic. No evidence of consolidation in the lateral projection. No edema, effusion, or pneumothorax. Normal heart size.  IMPRESSION: Stable exam.  No edema or consolidation.   Electronically Signed   By: Jorje Guild M.D.   On: 11/23/2013 14:09   Ct Head Wo Contrast  12/14/2013   CLINICAL DATA:  Altered mental status  EXAM: CT HEAD WITHOUT CONTRAST  TECHNIQUE: Contiguous axial images were obtained from the base of the skull through the vertex without intravenous contrast. Study was obtained within 24 hr of  patient's arrival at the emergency department.  COMPARISON:  Brain CT November 28, 2013 and brain MRI November 29, 2013.  FINDINGS: Mild generalized atrophy remains stable. There is no demonstrable mass, hemorrhage, extra-axial fluid collection, or midline shift. Patchy small vessel disease in the centra semiovale bilaterally is stable. No new gray-white compartment lesions are identified. There is no acute appearing infarct on  this study.  The bony calvarium appears intact. Mastoids on the left are clear. Mastoids on the right are partially opacified, stable. There is patchy ethmoid sinus disease bilaterally. Atrophy of the left parotid gland with fatty replacement remains. The visualized right parotid gland appears somewhat hypertrophied but otherwise unremarkable in appearance, unchanged from recent prior study.  IMPRESSION: Atrophy with small vessel disease. No intracranial mass, hemorrhage, or acute appearing infarct.  Atrophy of the left parotid gland with relative hypertrophy of the right parotid gland appears stable. There is right mastoid as well as bilateral ethmoid sinus disease.   Electronically Signed   By: Lowella Grip M.D.   On: 12/14/2013 16:07   Ct Head Wo Contrast  11/28/2013   CLINICAL DATA:  Fall.  EXAM: CT HEAD WITHOUT CONTRAST  TECHNIQUE: Contiguous axial images were obtained from the base of the skull through the vertex without intravenous contrast.  COMPARISON:  MR HEAD W/O CM dated 12/01/2011  FINDINGS: The ventricles and sulci are normal for age. No intraparenchymal hemorrhage, mass effect nor midline shift. Patchy supratentorial white matter hypodensities are within normal range for patient's age and though non-specific suggest sequelae of chronic small vessel ischemic disease. No acute large vascular territory infarcts.  No abnormal extra-axial fluid collections. Basal cisterns are patent. Moderate calcific atherosclerosis of the carotid siphons.  No skull fracture. Small left maxillary  mucosal retention cyst, mild paranasal sinus mucosal thickening without air-fluid levels. Mastoid air cells are well aerated. Moderate temporomandibular osteoarthrosis. . The included ocular globes and orbital contents are non-suspicious. Marked asymmetry of the parotid glands, the right appears enlarged in the left appears very fatty replaced.  IMPRESSION: No acute intracranial process.  Moderate white matter changes suggest chronic small vessel ischemic disease.  Asymmetric parotid glands, the right appears mildly edematous and the left appears fatty replaced versus resected with flap, recommend clinical correlation and correlation with surgical history.   Electronically Signed   By: Elon Alas   On: 11/28/2013 05:40   Ct Angio Chest Pe W/cm &/or Wo Cm  11/28/2013   CLINICAL DATA:  Shortness of breath. Found down. Evaluate for potential pulmonary embolism.  EXAM: CT ANGIOGRAPHY CHEST WITH CONTRAST  TECHNIQUE: Multidetector CT imaging of the chest was performed using the standard protocol during bolus administration of intravenous contrast. Multiplanar CT image reconstructions and MIPs were obtained to evaluate the vascular anatomy.  CONTRAST:  158mL OMNIPAQUE IOHEXOL 350 MG/ML SOLN  COMPARISON:  Chest CT 05/07/2010.  FINDINGS: Mediastinum: Study is limited by patient respiratory motion. With these limitations in mind, there is no central, lobar or segmental sized pulmonary embolism. Accurate assessment for smaller subsegmental sized pulmonary embolism is not possible secondary to the limitations of this examination. Heart size is normal. There is no significant pericardial fluid, thickening or pericardial calcification. There is atherosclerosis of the thoracic aorta, the great vessels of the mediastinum and the coronary arteries, including calcified atherosclerotic plaque in the left main, left anterior descending, left circumflex right coronary arteries. No pathologically enlarged mediastinal or hilar  lymph nodes. Esophagus is unremarkable in appearance.  Lungs/Pleura: Moderate centrilobular emphysema, most pronounced in the lung apices. No acute consolidative airspace disease. No pleural effusions. 5 mm right lower lobe nodule (image 89 of series 6), unchanged compared to prior study from 05/07/2010; this can be considered benign and does not require image followup). No other larger suspicious appearing pulmonary nodules or masses are identified on today's examination.  Upper Abdomen: Unremarkable.  Musculoskeletal: There are no aggressive  appearing lytic or blastic lesions noted in the visualized portions of the skeleton.  Review of the MIP images confirms the above findings.  IMPRESSION: 1. Limited examination secondary to patient motion demonstrated no evidence of clinically significant central, lobar or segmental sized pulmonary embolism. Smaller subsegmental sized pulmonary embolism cannot be entirely excluded. 2. No acute findings in the thorax to account for the patient's symptoms. 3. Atherosclerosis, including left main and 3 vessel coronary artery disease. Assessment for potential risk factor modification, dietary therapy or pharmacologic therapy may be warranted, if clinically indicated. 4. Moderate centrilobular emphysema. 5. Additional incidental findings, as above.   Electronically Signed   By: Vinnie Langton M.D.   On: 11/28/2013 14:15   Mr Brain Wo Contrast  12/14/2013   CLINICAL DATA:  Aphasia. The examination had to be discontinued prior to completion due to patient combativeness.  EXAM: MRI HEAD WITHOUT CONTRAST  TECHNIQUE: Multiplanar, multiecho pulse sequences of the brain and surrounding structures were obtained without intravenous contrast.  COMPARISON:  CT HEAD W/O CM dated 12/14/2013; MR HEAD W/O CM dated 11/29/2013  FINDINGS: The diffusion-weighted images demonstrate no evidence for acute or subacute infarction. Diffuse white matter changes are again noted.  IMPRESSION: 1. No acute  abnormality. 2. The study was discontinued after the diffusion-weighted imaging as the patient was combative and noncooperative. 3. Diffuse white matter changes are again noted.   Electronically Signed   By: Lawrence Santiago M.D.   On: 12/14/2013 21:04   Mr Brain Wo Contrast  11/29/2013   CLINICAL DATA:  Confusion.  Hypertension.  COPD.  EXAM: MRI HEAD WITHOUT CONTRAST  TECHNIQUE: Multiplanar, multiecho pulse sequences of the brain and surrounding structures were obtained without intravenous contrast.  COMPARISON:  CT HEAD W/O CM dated 11/28/2013; MR HEAD W/O CM dated 12/01/2011; MR MRA HEAD W/O CM dated 12/01/2011  FINDINGS: No evidence for acute infarction, hemorrhage, mass lesion, hydrocephalus, or extra-axial fluid. Advanced atrophy. Extensive chronic microvascular ischemic change throughout the periventricular and subcortical white matter. No foci of chronic hemorrhage. Pituitary, pineal, and cerebellar tonsils unremarkable. No upper cervical lesions. Visualized calvarium, skull base, and upper cervical osseous structures unremarkable. Scalp and extracranial soft tissues, orbits, and sinuses show no acute process. Chronic right mastoid fluid likely effusion was demonstrated in 2013.  Left parotid remains atrophic as compared to the right. This appearance was present in 2013 and is stable.  IMPRESSION: Advanced atrophy with extensive small vessel disease. Cyst similar to priors. No acute intracranial findings.  Atrophic left parotid gland.   Electronically Signed   By: Rolla Flatten M.D.   On: 11/29/2013 15:49   US Renal  12/03/2013   CLINICAL DATA:  Assess for perinephric abscess.  EXAM: RENAL/URINARY TRACT ULTRASOUND COMPLETE  COMPARISON:  07/09/2009  FINDINGS: Right Kidney:  Length: 12.5 cm. Echogenicity within normal limits. No mass or hydronephrosis visualized.  Left Kidney:  Length: 3.8 cm. Echogenicity within normal limits. No mass or hydronephrosis visualized. Cyst within the inferior pole is again noted  measuring 2.8 x 3.2 x 3.4 cm. This contains some diffuse low level internal echoes. Increased through transmission is identified.  Bladder:  Appears normal for degree of bladder distention.  IMPRESSION: 1. No evidence for renal abscess. 2. Stable cyst within the inferior pole of the left kidney.   Electronically Signed   By: Kerby Moors M.D.   On: 12/03/2013 11:23   Dg Chest Port 1 View  12/07/2013   CLINICAL DATA Hypoxia  EXAM PORTABLE CHEST - 1  VIEW  COMPARISON December 05, 2013  FINDINGS There is underlying emphysematous change. There is no edema or consolidation. The heart is upper normal in size. The pulmonary vascularity reflects underlying emphysema. No adenopathy. There is arthropathy in both shoulders.  IMPRESSION Underlying emphysematous change.  No edema or consolidation.  SIGNATURE  Electronically Signed   By: Lowella Grip M.D.   On: 12/07/2013 07:15   Dg Chest Port 1 View  12/05/2013   CLINICAL DATA:  Shortness of breath.  EXAM: PORTABLE CHEST - 1 VIEW  COMPARISON:  DG CHEST 1V PORT dated 12/04/2013; DG CHEST 2 VIEW dated 12/02/2013; CT ANGIO CHEST W/CM &/OR WO/CM dated 11/28/2013; DG CHEST 2 VIEW dated 11/23/2013  FINDINGS: The mediastinum and hilar structures are normal. Stable cardiomegaly. Normal pulmonary vascularity. Mild interstitial prominence noted in the lung bases. Active pneumonitis cannot be excluded. No pleural effusion or pneumothorax. No acute osseous abnormality.  IMPRESSION: Mild interstitial prominence within the lung bases with low lung volumes. Active pneumonitis cannot be excluded.   Electronically Signed   By: Marcello Moores  Register   On: 12/05/2013 07:37   Dg Chest Port 1 View  12/04/2013   CLINICAL DATA:  Assess infiltrates, edema  EXAM: PORTABLE CHEST - 1 VIEW  COMPARISON:  12/03/2013  FINDINGS: Unchanged interstitial coarsening and hyperinflation. No edema or consolidation. Normal heart size. No visible effusion. No pneumothorax.  IMPRESSION: 1. No edema or consolidation. 2.  COPD.   Electronically Signed   By: Jorje Guild M.D.   On: 12/04/2013 05:32   Dg Chest Port 1 View  12/03/2013   CLINICAL DATA:  Respiratory distress  EXAM: PORTABLE CHEST - 1 VIEW  COMPARISON:  12/02/2013  FINDINGS: The heart size appears normal. There is no pleural effusion or edema identified. No airspace consolidation.  IMPRESSION: 1. Stable radiograph of the chest. 2. No pneumonia or heart failure identified.   Electronically Signed   By: Kerby Moors M.D.   On: 12/03/2013 08:54   Dg Chest Portable 1 View  11/28/2013   CLINICAL DATA:  Loss of consciousness  EXAM: PORTABLE CHEST - 1 VIEW  COMPARISON:  11/23/2013  FINDINGS: Chronic interstitial coarsening. No edema or consolidation. No visible effusion or pneumothorax. Normal heart size for technique.  IMPRESSION: No edema or consolidation.   Electronically Signed   By: Jorje Guild M.D.   On: 11/28/2013 02:56   Mr Jodene Nam Head/brain Wo Cm  12/16/2013   CLINICAL DATA:  Stroke  EXAM: MRI HEAD WITHOUT CONTRAST  MRA HEAD WITHOUT CONTRAST  TECHNIQUE: Multiplanar, multiecho pulse sequences of the brain and surrounding structures were obtained without intravenous contrast. Angiographic images of the head were obtained using MRA technique without contrast.  COMPARISON:  12/14/2013.  11/29/2013, MRI 12/01/2011  FINDINGS: MRI HEAD FINDINGS  There is mild motion degradation. Diffusion imaging does not show any acute or subacute infarction. The brainstem is normal. There is cerebellar atrophy. The cerebral hemispheres show old cortical and subcortical ischemic change in the right occipital lobe. Extensive areas of abnormal signal throughout the hemispheric white matter. These are consistent with old small vessel insults. Mild chronic small-vessel changes noted in the thalami and basal ganglia. No large vessel territory infarction. No mass lesion, hemorrhage, hydrocephalus or extra-axial collection. Paranasal sinuses do not show any significant inflammation.  There is fluid throughout the right mastoid air cells.  MRA HEAD FINDINGS  Both internal carotid arteries are patent into the brain. There is atherosclerotic irregularity and narrowing in both carotid siphon regions. The  left internal carotid artery supplies the left middle cerebral artery and both anterior cerebral arteries. The right anterior cerebral artery is severely diseased and does not show demonstrable flow on today's examination. On the previous study of March 2013, there was minimal flow a in this vessel. Both posterior cerebral arteries take a fetal origin from the anterior circulation.  Right vertebral artery is the dominant vessel widely patent to the basilar. There is some atherosclerotic irregularity of the distal right vertebral artery unchanged since the previous study. The left vertebral artery is a small vessel with extensive atherosclerotic change. This vessel all may give minimal supply to the basilar. No basilar stenosis. Superior cerebellar and posterior cerebral arteries are patent bilaterally. There is atherosclerotic irregularity of the more distal branch vessels.  IMPRESSION: No acute infarction. Extensive chronic small vessel changes throughout the brain. 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  Atherosclerotic narrowing and irregularity of both carotid siphon regions. Severely diseased right A1 segment which does not definitely show flow today. Both the anterior cerebral arteries are appear well supplied from the left carotid system.  Severely diseased left vertebral artery. The right vertebral artery is dominant and widely patent to the basilar. Posterior circulation branch vessels are patent but show atherosclerotic irregularity.   Electronically Signed   By: Nelson Chimes M.D.   On: 12/16/2013 15:53     Microbiology: Recent Results (from the past 240 hour(s))  CULTURE, BLOOD (ROUTINE X 2)     Status: None   Collection Time    12/14/13  3:03 PM      Result Value Ref  Range Status   Specimen Description BLOOD HAND LEFT   Final   Special Requests BOTTLES DRAWN AEROBIC AND ANAEROBIC 5CC   Final   Culture  Setup Time     Final   Value: 12/14/2013 19:29     Performed at [facility]   Culture     Final   Value:        BLOOD CULTURE RECEIVED NO GROWTH TO DATE CULTURE WILL BE HELD FOR 5 DAYS BEFORE ISSUING A FINAL NEGATIVE REPORT     Performed at [facility]   Report Status PENDING   Incomplete  CULTURE, BLOOD (ROUTINE X 2)     Status: None   Collection Time    12/14/13  3:03 PM      Result Value Ref Range Status   Specimen Description BLOOD HAND RIGHT   Final   Special Requests BOTTLES DRAWN AEROBIC AND ANAEROBIC 5CCBLUE 1CCRED   Final   Culture  Setup Time     Final   Value: 12/14/2013 19:29     Performed at [facility]   Culture     Final   Value:        BLOOD CULTURE RECEIVED NO GROWTH TO DATE CULTURE WILL BE HELD FOR 5 DAYS BEFORE ISSUING A FINAL NEGATIVE REPORT     Performed at [facility]   Report Status PENDING   Incomplete  URINE CULTURE     Status: None   Collection Time    12/14/13  4:52 PM      Result Value Ref Range Status   Specimen Description URINE, CATHETERIZED   Final   Special Requests NONE   Final   Culture  Setup Time     Final   Value: 12/14/2013 17:12     Performed at [place]     Final  Value: NO GROWTH     Performed at [facility]   Culture     Final   Value: NO GROWTH     Performed at [facility]   Report Status 12/15/2013 FINAL   Final     Labs: Basic Metabolic Panel:  Recent Labs Lab 12/14/13 1435 12/15/13 0635 12/16/13 0240 12/17/13 0558 12/18/13 0540  NA 136* 130* 137 139 138  K 4.1 3.4* 3.8 3.8 3.8  CL 95* 92* 100 100 98  CO2 27 24 25 26 26   GLUCOSE 86 87 206* 112* 148*  BUN 20 14 13 10 9   CREATININE 0.72 0.70 0.80 0.86 0.83  CALCIUM 9.1 8.2* 8.3* 8.7 8.5  MG  --   --  1.7  --   --    Liver Function Tests:  Recent  Labs Lab 12/14/13 1435  AST 20  ALT 45  ALKPHOS 62  BILITOT 1.3*  PROT 5.9*  ALBUMIN 3.2*   No results found for this basename: LIPASE, AMYLASE,  in the last 168 hours  Recent Labs Lab 12/14/13 2147  AMMONIA 17   CBC:  Recent Labs Lab 12/14/13 1435 12/15/13 0635 12/16/13 0240 12/17/13 0558  WBC 15.4* 14.0* 9.1 9.5  NEUTROABS 11.0*  --   --   --   HGB 15.9 14.7 13.5 14.3  HCT 45.7 42.6 39.6 41.7  MCV 86.6 86.4 86.8 86.9  PLT 148* 141* 131* 141*   Cardiac Enzymes: No results found for this basename: CKTOTAL, CKMB, CKMBINDEX, TROPONINI,  in the last 168 hours BNP: No components found with this basename: POCBNP,  CBG:  Recent Labs Lab 12/19/13 0053 12/19/13 0453 12/19/13 0632 12/19/13 0756 12/19/13 1206  GLUCAP 223* 160* 116* 124* 208*    Time coordinating discharge:  Greater than 30 minutes  Signed:  Keondria Siever, DO [facility] Pager: [phone] 12/19/2013, 1:35 PM

## 2013-12-19 NOTE — Clinical Social Work Note (Addendum)
[facility] able to admit pt to their facility on 12/19/2013. CSW has faxed over updated PT/OT clinicals to [facility]. Pt's family to meet with admissions liaison with [facility] at 1:45pm on 12/19/2013 to complete admission paperwork. MD notified of information above.  CSW to continue to follow and assist with discharge once pt is medically stable for discharge from [facility].  Discharge summary has been faxed to [facility]. Discharge packet is complete and has been placed on pt's shadow chart. CSW has spoken to pt's daughter, Sula Soda, and pt's girlfriend, Faith regarding pt's discharge. Family and pt agreeable to discharge from [facility] to [facility] on 12/19/2013. CSW has arranged for ambulance transportation. RN notified of information above and provided with [facility] number for purposes of report.  Pati Gallo, [place] Social Worker [phone]

## 2013-12-19 NOTE — Progress Notes (Signed)
Physical Therapy Treatment Patient Details Name: Jonathan [name] MRN: [id number] DOB: 15-Nov-1940 Today's Date: 12/19/2013 Time: 8295-6213 PT Time Calculation (min): 26 min  PT Assessment / Plan / Recommendation  History of Present Illness pt presents with Confusion and speech difficulties.  MRI negative for acute infarct.  Pt with encephalopathy with work up underway.  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 Still having some safety concerns. Patient has progressed too well to require CIR (MD in during session). Patient will need SNF until he has 24/7 supervision at home.   Follow Up Recommendations  SNF (unless patient has 24/7 supervision at home)     Does the patient have the potential to tolerate intense rehabilitation     Barriers to Discharge        Equipment Recommendations  Rolling walker with 5" wheels (unsure if patient would use. He does have cane)    Recommendations for Other Services    Frequency Min 4X/week   Progress towards PT Goals Progress towards PT goals: Progressing toward goals  Plan Current plan remains appropriate    Precautions / Restrictions Precautions Precautions: Fall Restrictions Weight Bearing Restrictions: No   Pertinent Vitals/Pain no apparent distress     Mobility  Bed Mobility Overal bed mobility: Modified Independent Transfers Sit to Stand: Supervision General transfer comment: supervision for safety Ambulation/Gait Ambulation/Gait assistance: Min guard Ambulation Distance (Feet): 300 Feet Assistive device:  (IV pole) Gait Pattern/deviations: Decreased stride length;Step-through pattern General Gait Details: Patient with heavier L lean this session but no LOB. Cues for upright posture and to try to continue in midline and swift weight.     Exercises     PT Diagnosis:    PT Problem List:   PT Treatment Interventions:     PT Goals (current goals can now be found in the care plan  section)    Visit Information  Last PT Received On: 12/19/13 Assistance Needed: +1 History of Present Illness: pt presents with Confusion and speech difficulties.  MRI negative for acute infarct.  Pt with encephalopathy with work up underway.  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    Subjective Data      Cognition  Cognition Arousal/Alertness: Awake/alert Behavior During Therapy: WFL for tasks assessed/performed Area of Impairment: Safety/judgement Safety/Judgement: Decreased awareness of safety General Comments: Patient continues to be somewhat impulsive    Balance  Balance Sitting balance-Leahy Scale: Normal Standing balance-Leahy Scale: Fair  End of Session PT - End of Session Equipment Utilized During Treatment: Oxygen;Gait belt Activity Tolerance: Patient tolerated treatment well Patient left: in chair;with call bell/phone within reach;with chair alarm set Nurse Communication: Mobility status   GP     Jacqualyn Posey 12/19/2013, 10:07 AM  12/19/2013 Jacqualyn Posey PTA [phone] pager [phone] office

## 2013-12-19 NOTE — Progress Notes (Addendum)
Occupational Therapy Treatment Patient Details Name: Jonathan [name] MRN: [id number] DOB: July 13, 1941 Today's Date: 12/19/2013 Time: 7829-5621 OT Time Calculation (min): 31 min  OT Assessment / Plan / Recommendation  History of present illness pt presents with Confusion and speech difficulties.  MRI negative for acute infarct.  Pt with encephalopathy with work up underway.  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 Recommending SNF for additional rehab prior to d/c home, unless pt has 24/7 supervision at home, then recommend [facility]. Pt with red/blister spot on left side of buttocks-nurse notified.   Follow Up Recommendations  SNF;Supervision/Assistance - 24 hour    Barriers to Discharge       Equipment Recommendations  Other (comment) (defer to next venue)    Recommendations for Other Services    Frequency Min 2X/week   Progress towards OT Goals Progress towards OT goals: Progressing toward goals  Plan Discharge plan remains appropriate    Precautions / Restrictions Precautions Precautions: Fall Restrictions Weight Bearing Restrictions: No   Pertinent Vitals/Pain No pain when asked at end of session.     ADL  Grooming: Teeth care;Brushing hair;Min guard Where Assessed - Grooming: Supported standing Upper Body Bathing: Set up;Supervision/safety Where Assessed - Upper Body Bathing: Unsupported sitting Lower Body Bathing: Min guard Where Assessed - Lower Body Bathing: Supported sit to stand Lower Body Dressing: Minimal assistance Where Assessed - Lower Body Dressing: Unsupported sit to stand Toilet Transfer: Min Radio broadcast assistant Method: Sit to stand (sit to stand from [facility]; stood at toilet to urinate) Science writer: Comfort height toilet;Bedside commode Toileting - Clothing Manipulation and Hygiene: Min guard Where Assessed - Toileting Clothing Manipulation and Hygiene: Standing Equipment Used: Gait  belt;Other (comment) (oxygen) Transfers/Ambulation Related to ADLs: Min guard for transfers and ambulation. ADL Comments: Pt performed bathing, grooming, and dressing at sink. Pt also ambulated a little in hallway. Educated on energy conservation techniques thoughout session as well as deep breathing technique as pt appeared out of breath. Educated on safety with ADLs. Educated that it is beneficial to work with therapy to increase activity tolerance. Pt with slight LOB at sink during ADL requiring Min guard/Min A.   OT Diagnosis:    OT Problem List:   OT Treatment Interventions:     OT Goals(current goals can now be found in the care plan section) Acute Rehab OT Goals Patient Stated Goal: not stated OT Goal Formulation: With patient Time For Goal Achievement: 12/22/13 Potential to Achieve Goals: Good  Visit Information  Last OT Received On: 12/19/13 Assistance Needed: +1 History of Present Illness: pt presents with Confusion and speech difficulties.  MRI negative for acute infarct.  Pt with encephalopathy with work up underway.  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    Mobility  Bed Mobility Overal bed mobility: Needs Assistance Sit to supine: Supervision Transfers Overall transfer level: Needs assistance Transfers: Sit to/from Stand Sit to Stand: Min guard;Supervision General transfer comment: Cues to reach to [facility].  Exercises         End of Session OT - End of Session Equipment Utilized During Treatment: Gait belt;Oxygen Activity Tolerance: Patient limited by fatigue Patient left: in bed;with call bell/phone within reach;with bed alarm set  [place], [place] L OTR/L [phone] 12/19/2013, 11:29 AM

## 2013-12-19 NOTE — Progress Notes (Signed)
Report called and given to Timmothy Sours, Therapist, sports at [facility].  Pt. Will be going to room #702 [place] at [facility].

## 2013-12-20 ENCOUNTER — Encounter: Payer: Self-pay | Admitting: Adult Health

## 2013-12-20 ENCOUNTER — Non-Acute Institutional Stay (SKILLED_NURSING_FACILITY): Payer: Medicare Other | Admitting: Adult Health

## 2013-12-20 DIAGNOSIS — E1149 Type 2 diabetes mellitus with other diabetic neurological complication: Secondary | ICD-10-CM

## 2013-12-20 DIAGNOSIS — J441 Chronic obstructive pulmonary disease with (acute) exacerbation: Secondary | ICD-10-CM

## 2013-12-20 DIAGNOSIS — R5381 Other malaise: Secondary | ICD-10-CM

## 2013-12-20 DIAGNOSIS — I6350 Cerebral infarction due to unspecified occlusion or stenosis of unspecified cerebral artery: Secondary | ICD-10-CM

## 2013-12-20 DIAGNOSIS — E785 Hyperlipidemia, unspecified: Secondary | ICD-10-CM

## 2013-12-20 DIAGNOSIS — I10 Essential (primary) hypertension: Secondary | ICD-10-CM

## 2013-12-20 DIAGNOSIS — J690 Pneumonitis due to inhalation of food and vomit: Secondary | ICD-10-CM

## 2013-12-20 DIAGNOSIS — I639 Cerebral infarction, unspecified: Secondary | ICD-10-CM

## 2013-12-20 DIAGNOSIS — B029 Zoster without complications: Secondary | ICD-10-CM | POA: Insufficient documentation

## 2013-12-20 LAB — CULTURE, BLOOD (ROUTINE X 2)
Culture: NO GROWTH
Culture: NO GROWTH

## 2013-12-20 NOTE — Progress Notes (Signed)
This encounter was created in error - please disregard.

## 2013-12-20 NOTE — Progress Notes (Signed)
Patient ID: Jonathan [name], male   DOB: [birth year], 73 y.o.   MRN: [id number]     [facility]  Allergies  Allergen Reactions  . Amoxicillin-Pot Clavulanate Other (See Comments)    Severe stomach pain.   . Quinapril Hcl     REACTION: back pain  . Valsartan     REACTION: back pain  . Varenicline Tartrate     REACTION: AMS, hallucinations, night mares     Chief Complaint  Patient presents with  . Hospitalization Follow-up    HPI:  He has been hospitalized for pneumonia; copd exacerbation with acute on chronic respiratory failure. He is here for short term rehab with his goal to return home home at this time. He does have a shingles rash on his left buttock which he states he has had for 2 days.     Past Medical History  Diagnosis Date  . HTN (hypertension)   . HLD (hyperlipidemia)   . Pulmonary nodule, right     lower lobe  . COPD (chronic obstructive pulmonary disease)   . Rotator cuff injury     right  . Frozen shoulder   . Shoulder pain   . Hyperkalemia   . Dermatitis seborrheica   . Other diseases of lung, not elsewhere classified   . Kidney stone   . Allergic rhinitis   . ED (erectile dysfunction)   . Diabetes mellitus type II   . Asthma   . Tobacco abuse   . Stroke     pt states "last year"    Past Surgical History  Procedure Laterality Date  . Cervical discectomy  1998  . Ett  11/1994  . Tm repair    . Tee without cardioversion  12/03/2011    Procedure: TRANSESOPHAGEAL ECHOCARDIOGRAM (TEE);  Surgeon: Lelon Perla, MD;  Location: [facility] ENDOSCOPY;  Service: Cardiovascular;  Laterality: N/A;    VITAL SIGNS BP 119/55  Pulse 74  Ht 5\' 8"  (1.727 m)  Wt 179 lb 6.4 oz (81.375 kg)  BMI 27.28 kg/m2   Patient's Medications  New Prescriptions   No medications on file  Previous Medications   ALBUTEROL (PROVENTIL) (2.5 MG/3ML) 0.083% NEBULIZER SOLUTION    Take 3 mLs (2.5 mg total) by nebulization 2 (two) times daily as needed for shortness of breath.   ALBUTEROL (VENTOLIN HFA) 108 (90 BASE) MCG/ACT INHALER    Inhale 2 puffs into the lungs every 6 (six) hours as needed for wheezing.   AMLODIPINE (NORVASC) 5 MG TABLET    Take 5 mg by mouth daily.   ASPIRIN 81 MG TABLET    Take 1 tablet (81 mg total) by mouth daily.   ATORVASTATIN (LIPITOR) 10 MG TABLET    Take 10 mg by mouth daily.   GLIPIZIDE (GLIPIZIDE XL) 10 MG 24 HR TABLET    Take 1 tablet (10 mg total) by mouth daily.   GUAIFENESIN (MUCINEX) 600 MG 12 HR TABLET    Take 1 tablet (600 mg total) by mouth 2 (two) times daily.   INSULIN GLARGINE (LANTUS) 100 UNITS/ML SOLN    Inject 30 Units into the skin daily at 8 pm.   LEVETIRACETAM (KEPPRA) 500 MG TABLET    Take 1 tablet (500 mg total) by mouth 2 (two) times daily.   LEVOFLOXACIN (LEVAQUIN) 750 MG TABLET    Take 1 tablet (750 mg total) by mouth daily. Start 12/20/13   METFORMIN (GLUCOPHAGE) 1000 MG TABLET    Take 500-1,000 mg by mouth See admin  instructions. Take 1 tablet in the morning, 0.5 tablet midday and 1 tablet in the evening   TIOTROPIUM (SPIRIVA HANDIHALER) 18 MCG INHALATION CAPSULE    Place 1 capsule (18 mcg total) into inhaler and inhale daily.  Modified Medications   Modified Medication Previous Medication   INSULIN LISPRO (HUMALOG) 100 UNIT/ML KIWKPEN insulin lispro (HUMALOG) 100 UNIT/ML KiwkPen      Inject 5 Units into the skin 3 (three) times daily with meals. For cbg >=150    Inject 0-20 Units into the skin 3 (three) times daily with meals.  Discontinued Medications   No medications on file    SIGNIFICANT DIAGNOSTIC EXAMS  11-28-13: ct angio of  chest: 1. Limited examination secondary to patient motion demonstrated no evidence of clinically significant central, lobar or segmental sized pulmonary embolism. Smaller subsegmental sized pulmonary embolism cannot be entirely excluded. 2. No acute findings in the thorax to account for the patient's symptoms. 3. Atherosclerosis, including left main and 3 vessel coronary artery disease.  Assessment for potential risk factor modification, dietary therapy or pharmacologic therapy may be warranted, if clinically indicated. 4. Moderate centrilobular emphysema. 5. Additional incidental findings, as above.    11-28-13: TEE: Left ventricle: The cavity size was normal. Systolic function was normal. The estimated ejection fraction was in the range of 55% to 60%. Wall motion was normal; there were no regional wall motion abnormalities.- Impressions: Overall very poor image quality  11-29-13: mri of head: Advanced atrophy with extensive small vessel disease. Cyst similar to priors. No acute intracranial findings. Atrophic left parotid gland.   12-01-13: chest x-ray: No edema or consolidation. Evidence of a degree of underlying emphysematous change. Mild cardiac prominence.   12-03-13: renal ultrasound: 1. No evidence for renal abscess. 2. Stable cyst within the inferior pole of the left kidney.   12-14-13: chest x-ray: Low lung volumes which accentuates lung findings. There are areas of increased density within the lung bases differential considerations are atelectasis versus infiltrates. Right greater than left.  12-14-13: ct of head: Atrophy with small vessel disease. No intracranial mass, hemorrhage, or acute appearing infarct. Atrophy of the left parotid gland with relative hypertrophy of the right parotid gland appears stable. There is right mastoid as well as bilateral ethmoid sinus disease.   12-14-13: mri of head: 1. No acute abnormality. 2. The study was discontinued after the diffusion-weighted imaging as the patient was combative and noncooperative. 3. Diffuse white matter changes are again noted.   12-15-13: EEG: This EEG demonstrates a non-specific  region of dysfunction in the left temporal region. There was no  seizure or seizure predisposition recorded on this study.     LABS REVIEWED:   11-23-13: wbc 12.8; hgb 18.0; hct 51.4 ;mcv 88.5. plt 241; glucose 237; glucose 17; creat 1.0;  k+5.0; na++136; urine culture: no growth 11-28-13: wbc 16.1; hgb 17.1; hct 49.2; mcv 88; plt 198; glucose 261; bun 18; creat 0.89; k+4.4; na++133; liver normal albumin 3.4; ammonia 17; mag 2.1 phos 3.3; tsh 0.447; BNP 463.5; hgb a1c 8.4 11-29-13: chol 98; ldl 26; trig 96 12-04-13: wbc 26.9; hgb 19.3; hct 52.4; mcv 84.2;plt 231; glucose 171; bun 24; creat 0.87; k+4.2; na++131; liver normal albumin 3.2 12-15-13; wbc 14.0; hgb 14.7; hct 42.6; mcv 86.4;plt 141; glucose 87; bun 14; creat 0.7; k+3.;4 na++130 hgb a1c 9.0; chol 132; ldl 37; trig 164      Review of Systems  Constitutional: Negative for malaise/fatigue.  Eyes: Negative for blurred vision.  Respiratory: Positive for shortness  of breath. Negative for cough.        Wear [id number]  Cardiovascular: Negative for chest pain, palpitations and leg swelling.  Gastrointestinal: Negative for heartburn, abdominal pain and constipation.  Musculoskeletal: Negative for joint pain and myalgias.  Skin: Positive for rash.       Has a rash on left buttock which burns and stings   Neurological: Negative for weakness and headaches.  Psychiatric/Behavioral: Negative for depression. The patient is not nervous/anxious.      Physical Exam  Constitutional: He appears well-developed and well-nourished. No distress.  Neck: Neck supple. No JVD present. No thyromegaly present.  Cardiovascular: Normal rate, regular rhythm and intact distal pulses.   Pulmonary/Chest: Effort normal. No respiratory distress. He has no wheezes.  Has diminished breath sounds throughout  02 dependent   Abdominal: Soft. Bowel sounds are normal. He exhibits no distension. There is no tenderness.  Musculoskeletal: Normal range of motion. He exhibits no edema.  Neurological: He is alert.  Skin: Skin is warm and dry. He is not diaphoretic.  Has small shingles rash on left buttock with minimal inflammation present.   Psychiatric: He has a normal mood and affect.     ASSESSMENT/ PLAN:  1.  Seizure: no recent seizure activity present: will continue keppra 500 mg twice daily and will continue to monitor   2. Hypertension: is stable will continue: norvasc 5 mg daily asa 81 mg daily and will monitor   3. Diabetes: is stable will continue glipizide er 10 mg daily; metformin 500 mg at noon; 1 gm at supper; lantus 30 units nightly and humalog 5 units prior to meals for cbg >=150 will monitor   4. Dyslipidemia: will continue lipitor 10 mg daily   5. Copd exacerbation: he is present stable is 02 dependent; will continue spiriva daily;mucinex twice daily;  albuterol neb treatment twice daily as needed or inhaler 1 puff every 6 hours as needed and will monitor  6. Pneumonia: will have him complete his levaquin for a total of 2 days post hospitalization; will continue to monitor his status   7. 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 Shingles: will being valtrex 1 gm three times daily for one week and will monitor his status.   9. Physical deconditioning: will continue therapy as directed to improve upon his strength; gait; and ability to perform his adl's in an independent manner.     Will setup a cbc and bmp next week    Time spent with patient 50 minutes.        Ok Edwards NP [facility] Adult Medicine  Contact [phone] Monday through Friday 8am- 5pm  After hours call [phone]

## 2013-12-26 ENCOUNTER — Non-Acute Institutional Stay (SKILLED_NURSING_FACILITY): Payer: Medicare Other | Admitting: Internal Medicine

## 2013-12-26 ENCOUNTER — Encounter: Payer: Self-pay | Admitting: Internal Medicine

## 2013-12-26 DIAGNOSIS — R531 Weakness: Secondary | ICD-10-CM | POA: Insufficient documentation

## 2013-12-26 DIAGNOSIS — R5383 Other fatigue: Secondary | ICD-10-CM

## 2013-12-26 DIAGNOSIS — J449 Chronic obstructive pulmonary disease, unspecified: Secondary | ICD-10-CM

## 2013-12-26 DIAGNOSIS — J4489 Other specified chronic obstructive pulmonary disease: Secondary | ICD-10-CM

## 2013-12-26 DIAGNOSIS — I6350 Cerebral infarction due to unspecified occlusion or stenosis of unspecified cerebral artery: Secondary | ICD-10-CM

## 2013-12-26 DIAGNOSIS — I639 Cerebral infarction, unspecified: Secondary | ICD-10-CM

## 2013-12-26 DIAGNOSIS — R5381 Other malaise: Secondary | ICD-10-CM

## 2013-12-26 DIAGNOSIS — E1149 Type 2 diabetes mellitus with other diabetic neurological complication: Secondary | ICD-10-CM

## 2013-12-26 DIAGNOSIS — R569 Unspecified convulsions: Secondary | ICD-10-CM

## 2013-12-26 DIAGNOSIS — J690 Pneumonitis due to inhalation of food and vomit: Secondary | ICD-10-CM

## 2013-12-26 NOTE — Progress Notes (Signed)
Patient ID: Jonathan [name], male   DOB: 1941-05-22, 73 y.o.   MRN: [id number]     [facility]   PCP: Loura Pardon, MD  Code Status: full code  Allergies  Allergen Reactions  . Amoxicillin-Pot Clavulanate Other (See Comments)    Severe stomach pain.   . Quinapril Hcl     REACTION: back pain  . Valsartan     REACTION: back pain  . Varenicline Tartrate     REACTION: AMS, hallucinations, night mares    Chief Complaint: new admission  HPI:  73 y/o male patient is here for STR after hospital admission from 12/14/13- 12/19/13 with confusion. He was noted to have acute respiratory failure in setting of pneumonia. He also had hyponatremia and possible seizure on review of EEG. Neurology was consulted. His mental status slowly improved and he is here for rehabilitation. He is in his room and denies any concerns today. He has been working good with therapy team. No concerns from staff  Review of Systems:  Constitutional: Negative for fever, chills, weight loss, malaise/fatigue and diaphoresis.  HENT: Negative for congestion, hearing loss and sore throat.   Eyes: Negative for eye pain, blurred vision, double vision and discharge.  Respiratory: Negative for cough, sputum production, shortness of breath and wheezing.   Cardiovascular: Negative for chest pain, palpitations, orthopnea and leg swelling.  Gastrointestinal: Negative for heartburn, nausea, vomiting, abdominal pain, diarrhea and constipation.  Genitourinary: Negative for dysuria, urgency, frequency, hematuria and flank pain.  Musculoskeletal: Negative for back pain, falls, joint pain and myalgias.  Skin: Negative for itching and rash.  Neurological: Negative for weakness,dizziness, tingling, focal weakness and headaches.  Psychiatric/Behavioral: Negative for depression and memory loss. The patient is not nervous/anxious.     Past Medical History  Diagnosis Date  . HTN (hypertension)   . HLD (hyperlipidemia)   .  Pulmonary nodule, right     lower lobe  . COPD (chronic obstructive pulmonary disease)   . Rotator cuff injury     right  . Frozen shoulder   . Shoulder pain   . Hyperkalemia   . Dermatitis seborrheica   . Other diseases of lung, not elsewhere classified   . Kidney stone   . Allergic rhinitis   . ED (erectile dysfunction)   . Diabetes mellitus type II   . Asthma   . Tobacco abuse   . Stroke     pt states "last year"   Past Surgical History  Procedure Laterality Date  . Cervical discectomy  1998  . Ett  11/1994  . Tm repair    . Tee without cardioversion  12/03/2011    Procedure: TRANSESOPHAGEAL ECHOCARDIOGRAM (TEE);  Surgeon: Lelon Perla, MD;  Location: [facility] ENDOSCOPY;  Service: Cardiovascular;  Laterality: N/A;   Social History:   reports that he has been smoking Cigarettes.  He has been smoking about 0.20 packs per day. He has never used smokeless tobacco. He reports that he does not drink alcohol or use illicit drugs.  Family History  Problem Relation Age of Onset  . Asthma Sister   . Emphysema Sister     Medications: Patient's Medications  New Prescriptions   No medications on file  Previous Medications   ALBUTEROL (PROVENTIL) (2.5 MG/3ML) 0.083% NEBULIZER SOLUTION    Take 3 mLs (2.5 mg total) by nebulization 2 (two) times daily as needed for shortness of breath.   ALBUTEROL (VENTOLIN HFA) 108 (90 BASE) MCG/ACT INHALER    Inhale 2  puffs into the lungs every 6 (six) hours as needed for wheezing.   AMLODIPINE (NORVASC) 5 MG TABLET    Take 5 mg by mouth daily.   ASPIRIN 81 MG TABLET    Take 1 tablet (81 mg total) by mouth daily.   ATORVASTATIN (LIPITOR) 10 MG TABLET    Take 10 mg by mouth daily.   GLIPIZIDE (GLIPIZIDE XL) 10 MG 24 HR TABLET    Take 1 tablet (10 mg total) by mouth daily.   GUAIFENESIN (MUCINEX) 600 MG 12 HR TABLET    Take 1 tablet (600 mg total) by mouth 2 (two) times daily.   INSULIN GLARGINE (LANTUS) 100 UNITS/ML SOLN    Inject 30 Units into the skin  daily at 8 pm.   INSULIN LISPRO (HUMALOG) 100 UNIT/ML KIWKPEN    Inject 5 Units into the skin 3 (three) times daily with meals. For cbg >=150   LEVETIRACETAM (KEPPRA) 500 MG TABLET    Take 1 tablet (500 mg total) by mouth 2 (two) times daily.   LEVOFLOXACIN (LEVAQUIN) 750 MG TABLET    Take 1 tablet (750 mg total) by mouth daily. Start 12/20/13   METFORMIN (GLUCOPHAGE) 1000 MG TABLET    Take 500-1,000 mg by mouth See admin instructions. Take 1 tablet in the morning, 0.5 tablet midday and 1 tablet in the evening   TIOTROPIUM (SPIRIVA HANDIHALER) 18 MCG INHALATION CAPSULE    Place 1 capsule (18 mcg total) into inhaler and inhale daily.  Modified Medications   No medications on file  Discontinued Medications   No medications on file     Physical Exam: BP 117/61  Pulse 62  Temp(Src) 98.7 F (37.1 C)  Resp 16  SpO2 96%  General- elderly male in no acute distress Head- atraumatic, normocephalic Eyes- PERRLA, EOMI, no pallor, no icterus, no discharge Neck- no lymphadenopathy Cardiovascular- normal s1,s2, no murmurs/ rubs/ gallops Respiratory- bilateral clear to auscultation, no wheeze, no rhonchi, no crackles, no use of accessory muscles Abdomen- bowel sounds present, soft, non tender Musculoskeletal- able to move all 4 extremities, normal range of motion, no leg edema Neurological- no focal deficit Skin- warm and dry Psychiatry- alert and oriented to person, place and time, normal mood and affect    Labs reviewed: Basic Metabolic Panel:  Recent Labs  11/28/13 0732  12/16/13 0240 12/17/13 0558 12/18/13 0540  NA 138  < > 137 139 138  K 4.4  < > 3.8 3.8 3.8  CL 94*  < > 100 100 98  CO2 25  < > 25 26 26   GLUCOSE 370*  < > 206* 112* 148*  BUN 20  < > 13 10 9   CREATININE 0.99  < > 0.80 0.86 0.83  CALCIUM 8.2*  < > 8.3* 8.7 8.5  MG 2.1  --  1.7  --   --   PHOS 3.3  --   --   --   --   < > = values in this interval not displayed. Liver Function Tests:  Recent Labs   11/28/13 0830 12/04/13 0353 12/14/13 1435  AST 23 15 20   ALT 30 19 45  ALKPHOS 57 53 62  BILITOT 1.3* 0.9 1.3*  PROT 6.5 6.4 5.9*  ALBUMIN 3.4* 3.2* 3.2*   No results found for this basename: LIPASE, AMYLASE,  in the last 8760 hours  Recent Labs  12/14/13 2147  AMMONIA 17   CBC:  Recent Labs  12/04/13 0353 12/05/13 0450  12/14/13 1435 12/15/13 [id number]  12/16/13 0240 12/17/13 0558  WBC 26.9* 32.1*  < > 15.4* 14.0* 9.1 9.5  NEUTROABS 22.1* 27.2*  --  11.0*  --   --   --   HGB 19.3* 17.1*  < > 15.9 14.7 13.5 14.3  HCT 52.4* 48.1  < > 45.7 42.6 39.6 41.7  MCV 84.2 85.3  < > 86.6 86.4 86.8 86.9  PLT 231 233  < > 148* 141* 131* 141*  < > = values in this interval not displayed. Cardiac Enzymes:  Recent Labs  11/28/13 0215 11/28/13 0615 11/28/13 0830  CKTOTAL  --   --  127  TROPONINI <0.30 <0.30  --    BNP: No components found with this basename: POCBNP,  CBG:  Recent Labs  12/19/13 0756 12/19/13 1206 12/19/13 1629  GLUCAP 124* 208* 217*    Radiological Exams: Dg Chest 2 View  12/14/2013   CLINICAL DATA:  ALTERED MENTAL STATUS  EXAM: CHEST  2 VIEW  COMPARISON:  DG CHEST 1V PORT dated 12/07/2013  FINDINGS: Low lung volumes. There is increased density projects in the lung bases right greater than left. Cardiac silhouettes within normal limits. Calcified lymph nodes project within the infrahilar region on the left. The osseous structures demonstrate no acute abnormalities.  IMPRESSION: Low lung volumes which accentuates lung findings. There are areas of increased density within the lung bases differential considerations are atelectasis versus infiltrates. Right greater than left.   Electronically Signed   By: Margaree Mackintosh M.D.   On: 12/14/2013 16:07   Dg Chest 2 View  12/02/2013   CLINICAL DATA:  Shortness of Breath  EXAM: CHEST  2 VIEW  COMPARISON:  Chest radiograph November 28, 2013 and chest CT November 28, 2013  FINDINGS: There is a degree of underlying emphysematous  change. There is no edema or consolidation. Heart is borderline enlarged with normal pulmonary vascularity. No adenopathy. No bone lesions.  IMPRESSION: No edema or consolidation. Evidence of a degree of underlying emphysematous change. Mild cardiac prominence.   Electronically Signed   By: Lowella Grip M.D.   On: 12/02/2013 08:20   Dg Chest 2 View  11/23/2013   CLINICAL DATA:  Shortness of breath and cough  EXAM: CHEST  2 VIEW  COMPARISON:  12/02/2011  FINDINGS: Mild interstitial coarsening which appears chronic. No evidence of consolidation in the lateral projection. No edema, effusion, or pneumothorax. Normal heart size.  IMPRESSION: Stable exam.  No edema or consolidation.   Electronically Signed   By: Jorje Guild M.D.   On: 11/23/2013 14:09   Ct Head Wo Contrast  12/14/2013   CLINICAL DATA:  Altered mental status  EXAM: CT HEAD WITHOUT CONTRAST  TECHNIQUE: Contiguous axial images were obtained from the base of the skull through the vertex without intravenous contrast. Study was obtained within 24 hr of patient's arrival at the emergency department.  COMPARISON:  Brain CT November 28, 2013 and brain MRI November 29, 2013.  FINDINGS: Mild generalized atrophy remains stable. There is no demonstrable mass, hemorrhage, extra-axial fluid collection, or midline shift. Patchy small vessel disease in the centra semiovale bilaterally is stable. No new gray-white compartment lesions are identified. There is no acute appearing infarct on this study.  The bony calvarium appears intact. Mastoids on the left are clear. Mastoids on the right are partially opacified, stable. There is patchy ethmoid sinus disease bilaterally. Atrophy of the left parotid gland with fatty replacement remains. The visualized right parotid gland appears somewhat hypertrophied but otherwise unremarkable in  appearance, unchanged from recent prior study.  IMPRESSION: Atrophy with small vessel disease. No intracranial mass, hemorrhage, or acute  appearing infarct.  Atrophy of the left parotid gland with relative hypertrophy of the right parotid gland appears stable. There is right mastoid as well as bilateral ethmoid sinus disease.   Electronically Signed  By: Lowella Grip M.D.   On: 12/14/2013 16:07   Ct Head Wo Contrast  11/28/2013   CLINICAL DATA:  Fall.  EXAM: CT HEAD WITHOUT CONTRAST  TECHNIQUE: Contiguous axial images were obtained from the base of the skull through the vertex without intravenous contrast.  COMPARISON:  MR HEAD W/O CM dated 12/01/2011  FINDINGS: The ventricles and sulci are normal for age. No intraparenchymal hemorrhage, mass effect nor midline shift. Patchy supratentorial white matter hypodensities are within normal range for patient's age and though non-specific suggest sequelae of chronic small vessel ischemic disease. No acute large vascular territory infarcts.  No abnormal extra-axial fluid collections. Basal cisterns are patent. Moderate calcific atherosclerosis of the carotid siphons.  No skull fracture. Small left maxillary mucosal retention cyst, mild paranasal sinus mucosal thickening without air-fluid levels. Mastoid air cells are well aerated. Moderate temporomandibular osteoarthrosis. . The included ocular globes and orbital contents are non-suspicious. Marked asymmetry of the parotid glands, the right appears enlarged in the left appears very fatty replaced.  IMPRESSION: No acute intracranial process.  Moderate white matter changes suggest chronic small vessel ischemic disease.  Asymmetric parotid glands, the right appears mildly edematous and the left appears fatty replaced versus resected with flap, recommend clinical correlation and correlation with surgical history.   Electronically Signed   By: Elon Alas   On: 11/28/2013 05:40   Ct Angio Chest Pe W/cm &/or Wo Cm  11/28/2013   CLINICAL DATA:  Shortness of breath. Found down. Evaluate for potential pulmonary embolism.  EXAM: CT ANGIOGRAPHY CHEST WITH  CONTRAST  TECHNIQUE: Multidetector CT imaging of the chest was performed using the standard protocol during bolus administration of intravenous contrast. Multiplanar CT image reconstructions and MIPs were obtained to evaluate the vascular anatomy.  CONTRAST:  175mL OMNIPAQUE IOHEXOL 350 MG/ML SOLN  COMPARISON:  Chest CT 05/07/2010.  FINDINGS: Mediastinum: Study is limited by patient respiratory motion. With these limitations in mind, there is no central, lobar or segmental sized pulmonary embolism. Accurate assessment for smaller subsegmental sized pulmonary embolism is not possible secondary to the limitations of this examination. Heart size is normal. There is no significant pericardial fluid, thickening or pericardial calcification. There is atherosclerosis of the thoracic aorta, the great vessels of the mediastinum and the coronary arteries, including calcified atherosclerotic plaque in the left main, left anterior descending, left circumflex right coronary arteries. No pathologically enlarged mediastinal or hilar lymph nodes. Esophagus is unremarkable in appearance.  Lungs/Pleura: Moderate centrilobular emphysema, most pronounced in the lung apices. No acute consolidative airspace disease. No pleural effusions. 5 mm right lower lobe nodule (image 89 of series 6), unchanged compared to prior study from 05/07/2010; this can be considered benign and does not require image followup). No other larger suspicious appearing pulmonary nodules or masses are identified on today's examination.  Upper Abdomen: Unremarkable.  Musculoskeletal: There are no aggressive appearing lytic or blastic lesions noted in the visualized portions of the skeleton.  Review of the MIP images confirms the above findings.  IMPRESSION: 1. Limited examination secondary to patient motion demonstrated no evidence of clinically significant central, lobar or segmental sized pulmonary embolism. Smaller subsegmental sized pulmonary embolism cannot be  entirely excluded. 2. No acute findings in the thorax to account for the patient's symptoms. 3. Atherosclerosis, including left main and 3 vessel coronary artery disease. Assessment for potential risk factor modification, dietary therapy or pharmacologic therapy may be warranted, if clinically indicated. 4. Moderate centrilobular emphysema. 5. Additional incidental findings, as above.   Electronically Signed   By: Vinnie Langton M.D.   On: 11/28/2013 14:15   Mr Brain Wo Contrast  12/14/2013   CLINICAL DATA:  Aphasia. The examination had to be discontinued prior to completion due to patient combativeness.  EXAM: MRI HEAD WITHOUT CONTRAST  TECHNIQUE: Multiplanar, multiecho pulse sequences of the brain and surrounding structures were obtained without intravenous contrast.  COMPARISON:  CT HEAD W/O CM dated 12/14/2013; MR HEAD W/O CM dated 11/29/2013  FINDINGS: The diffusion-weighted images demonstrate no evidence for acute or subacute infarction. Diffuse white matter changes are again noted.  IMPRESSION: 1. No acute abnormality. 2. The study was discontinued after the diffusion-weighted imaging as the patient was combative and noncooperative. 3. Diffuse white matter changes are again noted.   Electronically Signed   By: Lawrence Santiago M.D.   On: 12/14/2013 21:04   Mr Brain Wo Contrast  11/29/2013   CLINICAL DATA:  Confusion.  Hypertension.  COPD.  EXAM: MRI HEAD WITHOUT CONTRAST  TECHNIQUE: Multiplanar, multiecho pulse sequences of the brain and surrounding structures were obtained without intravenous contrast.  COMPARISON:  CT HEAD W/O CM dated 11/28/2013; MR HEAD W/O CM dated 12/01/2011; MR MRA HEAD W/O CM dated 12/01/2011  FINDINGS: No evidence for acute infarction, hemorrhage, mass lesion, hydrocephalus, or extra-axial fluid. Advanced atrophy. Extensive chronic microvascular ischemic change throughout the periventricular and subcortical white matter. No foci of chronic hemorrhage. Pituitary, pineal, and cerebellar  tonsils unremarkable. No upper cervical lesions. Visualized calvarium, skull base, and upper cervical osseous structures unremarkable. Scalp and extracranial soft tissues, orbits, and sinuses show no acute process. Chronic right mastoid fluid likely effusion was demonstrated in 2013.  Left parotid remains atrophic as compared to the right. This appearance was present in 2013 and is stable.  IMPRESSION: Advanced atrophy with extensive small vessel disease. Cyst similar to priors. No acute intracranial findings.  Atrophic left parotid gland.   Electronically Signed   By: Rolla Flatten M.D.   On: 11/29/2013 15:49   US Renal  12/03/2013   CLINICAL DATA:  Assess for perinephric abscess.  EXAM: RENAL/URINARY TRACT ULTRASOUND COMPLETE  COMPARISON:  07/09/2009  FINDINGS: Right Kidney:  Length: 12.5 cm. Echogenicity within normal limits. No mass or hydronephrosis visualized.  Left Kidney:  Length: 3.8 cm. Echogenicity within normal limits. No mass or hydronephrosis visualized. Cyst within the inferior pole is again noted measuring 2.8 x 3.2 x 3.4 cm. This contains some diffuse low level internal echoes. Increased through transmission is identified.  Bladder:  Appears normal for degree of bladder distention.  IMPRESSION: 1. No evidence for renal abscess. 2. Stable cyst within the inferior pole of the left kidney.   Electronically Signed   By: Kerby Moors M.D.   On: 12/03/2013 11:23   Dg Chest Port 1 View  12/07/2013   CLINICAL DATA Hypoxia  EXAM PORTABLE CHEST - 1 VIEW  COMPARISON December 05, 2013  FINDINGS There is underlying emphysematous change. There is no edema or consolidation. The heart is upper normal in size. The pulmonary vascularity reflects underlying emphysema. No adenopathy. There is arthropathy in both shoulders.  IMPRESSION Underlying emphysematous change.  No edema or consolidation.  SIGNATURE  Electronically Signed   By: Lowella Grip M.D.   On: 12/07/2013 07:15   Dg Chest Port 1 View  12/05/2013    CLINICAL DATA:  Shortness of breath.  EXAM: PORTABLE CHEST - 1 VIEW  COMPARISON:  DG CHEST 1V PORT dated 12/04/2013; DG CHEST 2 VIEW dated 12/02/2013; CT ANGIO CHEST W/CM &/OR WO/CM dated 11/28/2013; DG CHEST 2 VIEW dated 11/23/2013  FINDINGS: The mediastinum and hilar structures are normal. Stable cardiomegaly. Normal pulmonary vascularity. Mild interstitial prominence noted in the lung bases. Active pneumonitis cannot be excluded. No pleural effusion or pneumothorax. No acute osseous abnormality.  IMPRESSION: Mild interstitial prominence within the lung bases with low lung volumes. Active pneumonitis cannot be excluded.   Electronically Signed   By: Marcello Moores  Register   On: 12/05/2013 07:37   Dg Chest Port 1 View  12/04/2013   CLINICAL DATA:  Assess infiltrates, edema  EXAM: PORTABLE CHEST - 1 VIEW  COMPARISON:  12/03/2013  FINDINGS: Unchanged interstitial coarsening and hyperinflation. No edema or consolidation. Normal heart size. No visible effusion. No pneumothorax.  IMPRESSION: 1. No edema or consolidation. 2. COPD.   Electronically Signed   By: Jorje Guild M.D.   On: 12/04/2013 05:32   Dg Chest Port 1 View  12/03/2013   CLINICAL DATA:  Respiratory distress  EXAM: PORTABLE CHEST - 1 VIEW  COMPARISON:  12/02/2013  FINDINGS: The heart size appears normal. There is no pleural effusion or edema identified. No airspace consolidation.  IMPRESSION: 1. Stable radiograph of the chest. 2. No pneumonia or heart failure identified.   Electronically Signed   By: Kerby Moors M.D.   On: 12/03/2013 08:54   Dg Chest Portable 1 View  11/28/2013   CLINICAL DATA:  Loss of consciousness  EXAM: PORTABLE CHEST - 1 VIEW  COMPARISON:  11/23/2013  FINDINGS: Chronic interstitial coarsening. No edema or consolidation. No visible effusion or pneumothorax. Normal heart size for technique.  IMPRESSION: No edema or consolidation.   Electronically Signed   By: Jorje Guild M.D.   On: 11/28/2013 02:56   Mr Jodene Nam Head/brain Wo  Cm  12/16/2013   CLINICAL DATA:  Stroke  EXAM: MRI HEAD WITHOUT CONTRAST  MRA HEAD WITHOUT CONTRAST  TECHNIQUE: Multiplanar, multiecho pulse sequences of the brain and surrounding structures were obtained without intravenous contrast. Angiographic images of the head were obtained using MRA technique without contrast.  COMPARISON:  12/14/2013.  11/29/2013, MRI 12/01/2011  FINDINGS: MRI HEAD FINDINGS  There is mild motion degradation. Diffusion imaging does not show any acute or subacute infarction. The brainstem is normal. There is cerebellar atrophy. The cerebral hemispheres show old cortical and subcortical ischemic change in the right occipital lobe. Extensive areas of abnormal signal throughout the hemispheric white matter. These are consistent with old small vessel insults. Mild chronic small-vessel changes noted in the thalami and basal ganglia. No large vessel territory infarction. No mass lesion, hemorrhage, hydrocephalus or extra-axial collection. Paranasal sinuses do not show any significant inflammation. There is fluid throughout the right mastoid air cells.  MRA HEAD FINDINGS  Both internal carotid arteries are patent into the brain. There is atherosclerotic irregularity and narrowing in both carotid siphon regions. The left internal carotid artery supplies the left middle cerebral artery and both anterior cerebral arteries. The right anterior cerebral artery is severely diseased and does not show demonstrable flow on today's examination. On the previous study of March 2013, there was minimal flow a in this vessel. Both posterior cerebral arteries take a  fetal origin from the anterior circulation.  Right vertebral artery is the dominant vessel widely patent to the basilar. There is some atherosclerotic irregularity of the distal right vertebral artery unchanged since the previous study. The left vertebral artery is a small vessel with extensive atherosclerotic change. This vessel all may give minimal  supply to the basilar. No basilar stenosis. Superior cerebellar and posterior cerebral arteries are patent bilaterally. There is atherosclerotic irregularity of the more distal branch vessels.  IMPRESSION: No acute infarction. Extensive chronic small vessel changes throughout the brain. 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  Atherosclerotic narrowing and irregularity of both carotid siphon regions. Severely diseased right A1 segment which does not definitely show flow today. Both the anterior cerebral arteries are appear well supplied from the left carotid system.  Severely diseased left vertebral artery. The right vertebral artery is dominant and widely patent to the basilar. Posterior circulation branch vessels are patent but show atherosclerotic irregularity.   Electronically Signed   By: Nelson Chimes M.D.   On: 12/16/2013 15:53     Assessment/Plan  Generalized weakness- here for STR , wiill have him work with PT and OT team, has made good improvement.   Pneumonia- has completed his antibiotics. No cough or dyspnea reported. Monitor clinically  Seizure- no further seizure activity. Continue keppra 500 mg bid for now  Dm type 2- stable. Monitor cbg. Continue metformin, lantus and premeal humalog for cbg > 150. Continue lipitor  Hypertension- continue norvasc 5 mg daily asa 81 mg daily   Copd- continue o2 by nasal canula with spiriva, albuterol neb and mucinex for now. Monitor breathing status   VA- stable, continue aspirin and statin    Family/ staff Communication: reviewed care plan with patient and nursing supervisor   Goals of care: STR  Labs/tests ordered- cbc,bmp    Blanchie Serve, MD  [facility] [phone] (Monday-Friday 8 am - 5 pm) [phone] (afterhours)

## 2013-12-27 ENCOUNTER — Non-Acute Institutional Stay (SKILLED_NURSING_FACILITY): Payer: Medicare Other | Admitting: Adult Health

## 2013-12-27 DIAGNOSIS — I639 Cerebral infarction, unspecified: Secondary | ICD-10-CM

## 2013-12-27 DIAGNOSIS — E1149 Type 2 diabetes mellitus with other diabetic neurological complication: Secondary | ICD-10-CM

## 2013-12-27 DIAGNOSIS — I10 Essential (primary) hypertension: Secondary | ICD-10-CM

## 2013-12-27 DIAGNOSIS — J449 Chronic obstructive pulmonary disease, unspecified: Secondary | ICD-10-CM

## 2013-12-27 DIAGNOSIS — I6350 Cerebral infarction due to unspecified occlusion or stenosis of unspecified cerebral artery: Secondary | ICD-10-CM

## 2013-12-27 NOTE — Progress Notes (Signed)
Patient ID: Jonathan [name], male   DOB: March 29, 1941, 73 y.o.   MRN: [id number]     [facility]  Allergies  Allergen Reactions  . Amoxicillin-Pot Clavulanate Other (See Comments)    Severe stomach pain.   . Quinapril Hcl     REACTION: back pain  . Valsartan     REACTION: back pain  . Varenicline Tartrate     REACTION: AMS, hallucinations, night mares     Chief Complaint  Patient presents with  . Discharge Note    HPI:  He is being discharged to home with home health for pt/ot/nursing.  He will not need any dme. He already has all necessary equipment. He will need prescriptions to be written.    Past Medical History  Diagnosis Date  . HTN (hypertension)   . HLD (hyperlipidemia)   . Pulmonary nodule, right     lower lobe  . COPD (chronic obstructive pulmonary disease)   . Rotator cuff injury     right  . Frozen shoulder   . Shoulder pain   . Hyperkalemia   . Dermatitis seborrheica   . Other diseases of lung, not elsewhere classified   . Kidney stone   . Allergic rhinitis   . ED (erectile dysfunction)   . Diabetes mellitus type II   . Asthma   . Tobacco abuse   . Stroke     pt states "last year"    Past Surgical History  Procedure Laterality Date  . Cervical discectomy  1998  . Ett  11/1994  . Tm repair    . Tee without cardioversion  12/03/2011    Procedure: TRANSESOPHAGEAL ECHOCARDIOGRAM (TEE);  Surgeon: Lelon Perla, MD;  Location: [facility] ENDOSCOPY;  Service: Cardiovascular;  Laterality: N/A;    VITAL SIGNS BP 117/61  Pulse 62  Ht 5\' 8"  (1.727 m)  Wt 179 lb (81.194 kg)  BMI 27.22 kg/m2   Patient's Medications  New Prescriptions   No medications on file  Previous Medications   ALBUTEROL (PROVENTIL) (2.5 MG/3ML) 0.083% NEBULIZER SOLUTION    Take 3 mLs (2.5 mg total) by nebulization 2 (two) times daily as needed for shortness of breath.   ALBUTEROL (VENTOLIN HFA) 108 (90 BASE) MCG/ACT INHALER    Inhale 2 puffs into the lungs every 6 (six) hours as  needed for wheezing.   AMLODIPINE (NORVASC) 5 MG TABLET    Take 5 mg by mouth daily.   ASPIRIN 81 MG TABLET    Take 1 tablet (81 mg total) by mouth daily.   ATORVASTATIN (LIPITOR) 10 MG TABLET    Take 10 mg by mouth daily.   GLIPIZIDE (GLIPIZIDE XL) 10 MG 24 HR TABLET    Take 1 tablet (10 mg total) by mouth daily.   GUAIFENESIN (MUCINEX) 600 MG 12 HR TABLET    Take 1 tablet (600 mg total) by mouth 2 (two) times daily.   INSULIN GLARGINE (LANTUS) 100 UNITS/ML SOLN    Inject 30 Units into the skin daily at 8 pm.   INSULIN LISPRO (HUMALOG) 100 UNIT/ML KIWKPEN    Inject 5 Units into the skin 3 (three) times daily with meals. For cbg >=150   LEVETIRACETAM (KEPPRA) 500 MG TABLET    Take 1 tablet (500 mg total) by mouth 2 (two) times daily.   LEVOFLOXACIN (LEVAQUIN) 750 MG TABLET    Take 1 tablet (750 mg total) by mouth daily. Start 12/20/13   METFORMIN (GLUCOPHAGE) 1000 MG TABLET    Take 500-1,000  mg by mouth See admin instructions. Take 1 tablet in the morning, 0.5 tablet midday and 1 tablet in the evening   TIOTROPIUM (SPIRIVA HANDIHALER) 18 MCG INHALATION CAPSULE    Place 1 capsule (18 mcg total) into inhaler and inhale daily.  Modified Medications   No medications on file  Discontinued Medications   No medications on file    SIGNIFICANT DIAGNOSTIC EXAMS  11-28-13: ct angio of  chest: 1. Limited examination secondary to patient motion demonstrated no evidence of clinically significant central, lobar or segmental sized pulmonary embolism. Smaller subsegmental sized pulmonary embolism cannot be entirely excluded. 2. No acute findings in the thorax to account for the patient's symptoms. 3. Atherosclerosis, including left main and 3 vessel coronary artery disease. Assessment for potential risk factor modification, dietary therapy or pharmacologic therapy may be warranted, if clinically indicated. 4. Moderate centrilobular emphysema. 5. Additional incidental findings, as above.    11-28-13: TEE: Left  ventricle: The cavity size was normal. Systolic function was normal. The estimated ejection fraction was in the range of 55% to 60%. Wall motion was normal; there were no regional wall motion abnormalities.- Impressions: Overall very poor image quality  11-29-13: mri of head: Advanced atrophy with extensive small vessel disease. Cyst similar to priors. No acute intracranial findings. Atrophic left parotid gland.   12-01-13: chest x-ray: No edema or consolidation. Evidence of a degree of underlying emphysematous change. Mild cardiac prominence.   12-03-13: renal ultrasound: 1. No evidence for renal abscess. 2. Stable cyst within the inferior pole of the left kidney.   12-14-13: chest x-ray: Low lung volumes which accentuates lung findings. There are areas of increased density within the lung bases differential considerations are atelectasis versus infiltrates. Right greater than left.  12-14-13: ct of head: Atrophy with small vessel disease. No intracranial mass, hemorrhage, or acute appearing infarct. Atrophy of the left parotid gland with relative hypertrophy of the right parotid gland appears stable. There is right mastoid as well as bilateral ethmoid sinus disease.   12-14-13: mri of head: 1. No acute abnormality. 2. The study was discontinued after the diffusion-weighted imaging as the patient was combative and noncooperative. 3. Diffuse white matter changes are again noted.   12-15-13: EEG: This EEG demonstrates a non-specific  region of dysfunction in the left temporal region. There was no  seizure or seizure predisposition recorded on this study.     LABS REVIEWED:   11-23-13: wbc 12.8; hgb 18.0; hct 51.4 ;mcv 88.5. plt 241; glucose 237; glucose 17; creat 1.0; k+5.0; na++136; urine culture: no growth 11-28-13: wbc 16.1; hgb 17.1; hct 49.2; mcv 88; plt 198; glucose 261; bun 18; creat 0.89; k+4.4; na++133; liver normal albumin 3.4; ammonia 17; mag 2.1 phos 3.3; tsh 0.447; BNP 463.5; hgb a1c  8.4 11-29-13: chol 98; ldl 26; trig 96 12-04-13: wbc 26.9; hgb 19.3; hct 52.4; mcv 84.2;plt 231; glucose 171; bun 24; creat 0.87; k+4.2; na++131; liver normal albumin 3.2 12-15-13; wbc 14.0; hgb 14.7; hct 42.6; mcv 86.4;plt 141; glucose 87; bun 14; creat 0.7; k+3.;4 na++130 hgb a1c 9.0; chol 132; ldl 37; trig 164      Review of Systems  Constitutional: Negative for malaise/fatigue.  Eyes: Negative for blurred vision.  Respiratory: Positive for shortness of breath. Negative for cough.        Wear [id number]  Cardiovascular: Negative for chest pain, palpitations and leg swelling.  Gastrointestinal: Negative for heartburn, abdominal pain and constipation.  Musculoskeletal: Negative for joint pain and myalgias.  Skin: negative  Neurological: Negative for weakness and headaches.  Psychiatric/Behavioral: Negative for depression. The patient is not nervous/anxious.      Physical Exam  Constitutional: He appears well-developed and well-nourished. No distress.  Neck: Neck supple. No JVD present. No thyromegaly present.  Cardiovascular: Normal rate, regular rhythm and intact distal pulses.   Pulmonary/Chest: Effort normal. No respiratory distress. He has no wheezes.  Has diminished breath sounds throughout  02 dependent   Abdominal: Soft. Bowel sounds are normal. He exhibits no distension. There is no tenderness.  Musculoskeletal: Normal range of motion. He exhibits no edema.  Neurological: He is alert.  Skin: Skin is warm and dry. He is not diaphoretic.   Psychiatric: He has a normal mood and affect.     ASSESSMENT/ PLAN:  Will discharge to home with home health for pt/ot/nursing. He will not need dme. His prescriptions have been written   Time spent with patient 35 minutes.        Ok Edwards NP [facility]  Contact [phone] Monday through Friday 8am- 5pm  After hours call [phone]

## 2013-12-28 ENCOUNTER — Ambulatory Visit: Payer: Medicare Other | Admitting: Family Medicine

## 2014-02-01 ENCOUNTER — Ambulatory Visit (INDEPENDENT_AMBULATORY_CARE_PROVIDER_SITE_OTHER): Payer: Medicare Other | Admitting: Family Medicine

## 2014-02-01 ENCOUNTER — Encounter: Payer: Self-pay | Admitting: Family Medicine

## 2014-02-01 VITALS — BP 132/64 | HR 92 | Temp 97.9°F | Ht 67.0 in | Wt 181.8 lb

## 2014-02-01 DIAGNOSIS — G9340 Encephalopathy, unspecified: Secondary | ICD-10-CM

## 2014-02-01 DIAGNOSIS — J449 Chronic obstructive pulmonary disease, unspecified: Secondary | ICD-10-CM

## 2014-02-01 DIAGNOSIS — R4701 Aphasia: Secondary | ICD-10-CM

## 2014-02-01 DIAGNOSIS — R569 Unspecified convulsions: Secondary | ICD-10-CM

## 2014-02-01 DIAGNOSIS — Z720 Tobacco use: Secondary | ICD-10-CM

## 2014-02-01 DIAGNOSIS — F17200 Nicotine dependence, unspecified, uncomplicated: Secondary | ICD-10-CM

## 2014-02-01 DIAGNOSIS — J690 Pneumonitis due to inhalation of food and vomit: Secondary | ICD-10-CM

## 2014-02-01 LAB — CBC WITH DIFFERENTIAL/PLATELET
Basophils Absolute: 0.1 10*3/uL (ref 0.0–0.1)
Basophils Relative: 0.3 % (ref 0.0–3.0)
Eosinophils Absolute: 0.4 10*3/uL (ref 0.0–0.7)
Eosinophils Relative: 2 % (ref 0.0–5.0)
HCT: 46.5 % (ref 39.0–52.0)
Hemoglobin: 15.6 g/dL (ref 13.0–17.0)
Lymphocytes Relative: 26.5 % (ref 12.0–46.0)
Lymphs Abs: 4.7 10*3/uL — ABNORMAL HIGH (ref 0.7–4.0)
MCHC: 33.5 g/dL (ref 30.0–36.0)
MCV: 88.2 fl (ref 78.0–100.0)
Monocytes Absolute: 1 10*3/uL (ref 0.1–1.0)
Monocytes Relative: 5.5 % (ref 3.0–12.0)
Neutro Abs: 11.8 10*3/uL — ABNORMAL HIGH (ref 1.4–7.7)
Neutrophils Relative %: 65.7 % (ref 43.0–77.0)
Platelets: 327 10*3/uL (ref 150.0–400.0)
RBC: 5.28 Mil/uL (ref 4.22–5.81)
RDW: 15.2 % (ref 11.5–15.5)
WBC: 17.9 10*3/uL — ABNORMAL HIGH (ref 4.0–10.5)

## 2014-02-01 LAB — BASIC METABOLIC PANEL
BUN: 12 mg/dL (ref 6–23)
CO2: 27 mEq/L (ref 19–32)
Calcium: 9.2 mg/dL (ref 8.4–10.5)
Chloride: 98 mEq/L (ref 96–112)
Creatinine, Ser: 0.9 mg/dL (ref 0.4–1.5)
GFR: 83.47 mL/min (ref >60.00)
Glucose, Bld: 96 mg/dL (ref 70–99)
Potassium: 3.6 mEq/L (ref 3.5–5.1)
Sodium: 136 mEq/L (ref 135–145)

## 2014-02-01 MED ORDER — TIOTROPIUM BROMIDE MONOHYDRATE 18 MCG IN CAPS: 18.0000 ug | ORAL_CAPSULE | Freq: Every day | RESPIRATORY_TRACT | Status: DC

## 2014-02-01 MED ORDER — ALBUTEROL SULFATE HFA 108 (90 BASE) MCG/ACT IN AERS: 2.0000 | INHALATION_SPRAY | Freq: Four times a day (QID) | RESPIRATORY_TRACT | Status: DC | PRN

## 2014-02-01 NOTE — Progress Notes (Signed)
Pre visit review using our clinic review tool, if applicable. No additional management support is needed unless otherwise documented below in the visit note. 

## 2014-02-01 NOTE — Patient Instructions (Addendum)
Please quit smoking entirely  Continue current medicines  Stop at check out for a neurology referral  Follow up with me in 3 months with labs prior

## 2014-02-01 NOTE — Progress Notes (Signed)
Subjective:    Patient ID: Jonathan [name], male    DOB: August 10, 1941, 73 y.o.   MRN: [id number]  HPI Here for f/u of hosp and then rehab stay at [facility] place   (family was unhappy with his care)   He does not remember much of it   hosp 3/18-3/23 most recently for acute encephalopathy/dysarthria likely due to asp pneumonia and seizure  hosp records rev in detail  MRI brain no acute change/ nl EF on echo , EEG showed some poss temporal lobe changes but no acute seizure activity Failed swallowing test and was initially on dysphagia 3 diet  2L 02 for pneumonia and copd Initially vanco/zosyn and then d/c with levaquin  keppra for seizure control   Due for labs   Lab Results  Component Value Date   HGBA1C 8.8* 12/17/2013   in hospital Added humalog 5 units with meals for cbg over 150 Has only needed humalog a few times Is eating a DM diet    Smoking status - quit for 6 weeks and now has occas cigarette  He thinks he can quit  Has 02 at home -he has used it only a few times when he felt like he needed it   Pulse ox 97% without 02 today Vitals stable  Wt up a few lb   Back to normal personality and mentation He is tired in general - he takes keppra - ? Side eff  occ light headed  Was recommended he see neurologist   Is back to a normal diet- no choking episodes at all and he does eat slower than he did  No swallowing problems or choking   Patient Active Problem List   Diagnosis Date Noted  . Generalized weakness 12/26/2013  . Seizures 12/26/2013  . Type II or unspecified type diabetes mellitus with neurological manifestations, not stated as uncontrolled 12/20/2013  . Shingles 12/20/2013  . Physical deconditioning 12/20/2013  . Acute delirium 12/16/2013  . Chronic respiratory failure 12/15/2013  . Aspiration pneumonia 12/15/2013  . Delirium 12/14/2013  . Aphasia 12/14/2013  . Expressive aphasia 12/14/2013  . Acute confusional state 12/14/2013  . Bacteremia 12/01/2013    . Acute respiratory failure 11/28/2013  . Syncope 11/28/2013  . Fever 11/28/2013  . Acute encephalopathy 11/28/2013  . Respiratory failure 11/28/2013  . Bronchitis, chronic obstructive w acute bronchitis 11/23/2013  . COPD exacerbation 11/23/2013  . Immunization not carried out because of patient refusal 07/05/2012  . CVA (cerebral infarction) 12/01/2011  . HLD (hyperlipidemia)   . Pulmonary nodule, right   . Shoulder pain   . Hyperkalemia   . Allergic rhinitis   . ED (erectile dysfunction)   . Asthma   . Tobacco abuse   . COPD 03/29/2010  . HYPERTENSION 01/05/2007   Past Medical History  Diagnosis Date  . HTN (hypertension)   . HLD (hyperlipidemia)   . Pulmonary nodule, right     lower lobe  . COPD (chronic obstructive pulmonary disease)   . Rotator cuff injury     right  . Frozen shoulder   . Shoulder pain   . Hyperkalemia   . Dermatitis seborrheica   . Other diseases of lung, not elsewhere classified   . Kidney stone   . Allergic rhinitis   . ED (erectile dysfunction)   . Diabetes mellitus type II   . Asthma   . Tobacco abuse   . Stroke     pt states "last year"   Past Surgical History  Procedure Laterality Date  . Cervical discectomy  1998  . Ett  11/1994  . Tm repair    . Tee without cardioversion  12/03/2011    Procedure: TRANSESOPHAGEAL ECHOCARDIOGRAM (TEE);  Surgeon: Lelon Perla, MD;  Location: [facility] ENDOSCOPY;  Service: Cardiovascular;  Laterality: N/A;   History  Substance Use Topics  . Smoking status: Current Some Day Smoker -- 0.20 packs/day    Types: Cigarettes  . Smokeless tobacco: Never Used     Comment: prev smoked 1 1/2 PPD. Currently 2 cigs daily  . Alcohol Use: No     Comment: Quit 40 years ago   Family History  Problem Relation Age of Onset  . Asthma Sister   . Emphysema Sister    Allergies  Allergen Reactions  . Amoxicillin-Pot Clavulanate Other (See Comments)    Severe stomach pain.   . Quinapril Hcl     REACTION: back pain   . Valsartan     REACTION: back pain  . Varenicline Tartrate     REACTION: AMS, hallucinations, night mares   Current Outpatient Prescriptions on File Prior to Visit  Medication Sig Dispense Refill  . albuterol (PROVENTIL) (2.5 MG/3ML) 0.083% nebulizer solution Take 3 mLs (2.5 mg total) by nebulization 2 (two) times daily as needed for shortness of breath.  75 mL  0  . amLODipine (NORVASC) 5 MG tablet Take 5 mg by mouth daily.      [name] aspirin 81 MG tablet Take 1 tablet (81 mg total) by mouth daily.  30 tablet  0  . atorvastatin (LIPITOR) 10 MG tablet Take 10 mg by mouth daily.      [name] glipiZIDE (GLIPIZIDE XL) 10 MG 24 hr tablet Take 1 tablet (10 mg total) by mouth daily.  90 tablet  0  . guaiFENesin (MUCINEX) 600 MG 12 hr tablet Take 1 tablet (600 mg total) by mouth 2 (two) times daily.  15 tablet  0  . insulin glargine (LANTUS) 100 units/mL SOLN Inject 30 Units into the skin daily at 8 pm.  10 mL  0  . levETIRAcetam (KEPPRA) 500 MG tablet Take 1 tablet (500 mg total) by mouth 2 (two) times daily.  60 tablet  0  . metFORMIN (GLUCOPHAGE) 1000 MG tablet Take 500-1,000 mg by mouth See admin instructions. Take 1 tablet in the morning, 0.5 tablet midday and 1 tablet in the evening      . insulin lispro (HUMALOG) 100 UNIT/ML KiwkPen Inject 5 Units into the skin 3 (three) times daily with meals. For cbg >=150       No current facility-administered medications on file prior to visit.    Review of Systems Review of Systems  Constitutional: Negative for fever, appetite change, and unexpected weight change. pos for fatigue that is gradually improving  Eyes: Negative for pain and visual disturbance.  Respiratory: Negative for cough and shortness of breath.  seldom wheezes  Cardiovascular: Negative for cp or palpitations    Gastrointestinal: Negative for nausea, diarrhea and constipation.  Genitourinary: Negative for urgency and frequency.  Skin: Negative for pallor or rash   Neurological: Negative  for weakness, light-headedness, numbness and headaches.  Hematological: Negative for adenopathy. Does not bruise/bleed easily.  Psychiatric/Behavioral: Negative for dysphoric mood. The patient is not nervous/anxious.  per family he is somewhat irritable        Objective:   Physical Exam  Constitutional: He appears well-developed and well-nourished. No distress.  HENT:  Head: Normocephalic and atraumatic.  Right Ear: External  ear normal.  Left Ear: External ear normal.  Nose: Nose normal.  Mouth/Throat: Oropharynx is clear and moist.  Eyes: Conjunctivae and EOM are normal. Pupils are equal, round, and reactive to light. Right eye exhibits no discharge. Left eye exhibits no discharge. No scleral icterus.  Neck: Normal range of motion. Neck supple. No JVD present. Carotid bruit is not present. No thyromegaly present.  Cardiovascular: Normal rate, regular rhythm, normal heart sounds and intact distal pulses.  Exam reveals no gallop.   Pulmonary/Chest: Effort normal and breath sounds normal. No respiratory distress. He has no wheezes. He has no rales.  Much improved air exch than from when he smoked more Diffusely distant bs  No rales or rhonchi or crackles   Abdominal: Soft. Bowel sounds are normal. He exhibits no distension, no abdominal bruit and no mass. There is no tenderness.  Musculoskeletal: He exhibits no edema.  Lymphadenopathy:    He has no cervical adenopathy.  Neurological: He is alert. He has normal reflexes. He displays tremor. He displays no atrophy. No cranial nerve deficit or sensory deficit. He exhibits normal muscle tone. He displays no seizure activity. Coordination and gait normal.  Slow but steady gait  Mild hand tremor   Skin: Skin is warm and dry. No rash noted. No erythema. No pallor.  Psychiatric: He has a normal mood and affect.  Somewhat irritable  Also tangential in conversation at times- wanting to focus on complaints about his hospital and nursing  care Upset about how several "mean" nurses treated him           Assessment & Plan:

## 2014-02-02 NOTE — Assessment & Plan Note (Signed)
Documented in hosp with acute encephalopathy and asp pneumonia Brain imaging -CT and MRI are reassuring  On keppra - which he thinks is sedating but has controlled seizures Ref to neuro for f/u

## 2014-02-02 NOTE — Assessment & Plan Note (Signed)
Pt has almost quit - and lung exam is imp  Stressed imp of quitting totally to avoid another hosp/pneumonia  Disc in detail risks of smoking and possible outcomes including copd, vascular/ heart disease, cancer , respiratory and sinus infections  Pt voices understanding

## 2014-02-02 NOTE — Assessment & Plan Note (Signed)
Clinically resolved Rev hosp records in detail with pt and caregiver  Lung exam improved  Stressed imp of total smoking cessation  Pt states swallowing is now normal - he eats regular diet but more slowly with better chewing

## 2014-02-02 NOTE — Assessment & Plan Note (Signed)
This is now resolved - rev hosp records from march / delerium with aspiration pneumonia and seizures  Lab today to document resolution of abnormalities

## 2014-02-02 NOTE — Assessment & Plan Note (Signed)
This is resolved from hosp/ acute encephalopathy

## 2014-02-02 NOTE — Assessment & Plan Note (Signed)
Improved symptoms with drastic cut in smoking  Has occ cig now-and I stressed the dire imp of absolute cessation with he and caregiver

## 2014-02-03 ENCOUNTER — Ambulatory Visit (INDEPENDENT_AMBULATORY_CARE_PROVIDER_SITE_OTHER)
Admission: RE | Admit: 2014-02-03 | Discharge: 2014-02-03 | Disposition: A | Discharge status: Home / Self Care | Payer: Medicare Other | Source: Ambulatory Visit | Attending: Family Medicine | Admitting: Family Medicine

## 2014-02-03 ENCOUNTER — Telehealth (INDEPENDENT_AMBULATORY_CARE_PROVIDER_SITE_OTHER): Payer: Medicare Other | Admitting: *Deleted

## 2014-02-03 ENCOUNTER — Telehealth: Payer: Self-pay | Admitting: Family Medicine

## 2014-02-03 ENCOUNTER — Other Ambulatory Visit: Payer: Self-pay | Admitting: *Deleted

## 2014-02-03 DIAGNOSIS — J690 Pneumonitis due to inhalation of food and vomit: Secondary | ICD-10-CM

## 2014-02-03 DIAGNOSIS — D72829 Elevated white blood cell count, unspecified: Secondary | ICD-10-CM | POA: Insufficient documentation

## 2014-02-03 LAB — POCT URINALYSIS DIPSTICK
Glucose, UA: 250
Spec Grav, UA: >=1.03
Urobilinogen, UA: 0.2
pH, UA: 6

## 2014-02-03 NOTE — Telephone Encounter (Signed)
cxr order for leukocytosis

## 2014-02-03 NOTE — Addendum Note (Signed)
Addended by: Tammi Sou on: 02/03/2014 03:58 PM   Modules accepted: Orders

## 2014-02-03 NOTE — Telephone Encounter (Signed)
Message copied by Tammi Sou on Fri Feb 03, 2014  2:59 PM ------      Message from: Loura Pardon A      Created: Fri Feb 03, 2014  1:44 PM       Of course- I understand       I would like to check a ua and cxr - please have him come in to do those this afternoon if possible ------

## 2014-02-03 NOTE — Telephone Encounter (Signed)
Pt is coming in today for cxray and U/A, please put in cxray order

## 2014-02-03 NOTE — Telephone Encounter (Signed)
Urine has some white cells in it - let family know I am going to send it for a culture -it is concentrated -please tell him to increase his water intake  Will see if cx shows infection and make a plan from there  No pneumonia on chest xray -please let me know if he develops cough or fever in the future  Thanks for bringing him in for these

## 2014-02-03 NOTE — Telephone Encounter (Signed)
Patient advised.   The patient answered the phone and so I reported this to him but the note says to let the family know.  If someone else needs to be advised, please ask [place] to do this on Monday.

## 2014-02-05 LAB — URINE CULTURE: Colony Count: 45000

## 2014-02-05 NOTE — Telephone Encounter (Signed)
I should have urine cx result back on monday

## 2014-02-06 ENCOUNTER — Telehealth: Payer: Self-pay | Admitting: Family Medicine

## 2014-02-06 MED ORDER — SULFAMETHOXAZOLE-TMP DS 800-160 MG PO TABS: 1.0000 | ORAL_TABLET | Freq: Two times a day (BID) | ORAL | Status: DC

## 2014-02-06 NOTE — Telephone Encounter (Signed)
Urine cx came out pos for staph infection  I want to treat with septra -I [facility]  Follow up about 1 week after finishing med for re check of pt /ua and wbc  Hope this helps him feel better

## 2014-02-06 NOTE — Telephone Encounter (Signed)
Pt notified of urine cx and Dr. Marliss Coots comments, f/u appt scheduled

## 2014-02-21 ENCOUNTER — Encounter: Payer: Self-pay | Admitting: Family Medicine

## 2014-02-21 ENCOUNTER — Ambulatory Visit (INDEPENDENT_AMBULATORY_CARE_PROVIDER_SITE_OTHER): Payer: Medicare Other | Admitting: Family Medicine

## 2014-02-21 VITALS — BP 132/72 | HR 103 | Temp 97.9°F | Ht 67.0 in | Wt 184.5 lb

## 2014-02-21 DIAGNOSIS — D72829 Elevated white blood cell count, unspecified: Secondary | ICD-10-CM

## 2014-02-21 DIAGNOSIS — N390 Urinary tract infection, site not specified: Secondary | ICD-10-CM

## 2014-02-21 DIAGNOSIS — Z87440 Personal history of urinary (tract) infections: Secondary | ICD-10-CM

## 2014-02-21 DIAGNOSIS — Z720 Tobacco use: Secondary | ICD-10-CM

## 2014-02-21 DIAGNOSIS — F17200 Nicotine dependence, unspecified, uncomplicated: Secondary | ICD-10-CM

## 2014-02-21 DIAGNOSIS — I10 Essential (primary) hypertension: Secondary | ICD-10-CM

## 2014-02-21 LAB — POCT UA - MICROSCOPIC ONLY
Casts, Ur, LPF, POC: 0
RBC, urine, microscopic: 0
Yeast, UA: 0

## 2014-02-21 LAB — POCT URINALYSIS DIPSTICK
Bilirubin, UA: NEGATIVE
Glucose, UA: NEGATIVE
Ketones, UA: NEGATIVE
Nitrite, UA: NEGATIVE
Spec Grav, UA: 1.015
Urobilinogen, UA: 0.2
pH, UA: 6

## 2014-02-21 NOTE — Assessment & Plan Note (Signed)
Staph aureus  tx with bactrim Clinical imp noted - and u micro is clear  Re check wbc today  Disc fluid intake

## 2014-02-21 NOTE — Patient Instructions (Signed)
Keep up your fluid intake  Labs today- for blood count  Keep working on quitting smoking  We will repeat your urine culture today  Follow up with the neurologist as planned

## 2014-02-21 NOTE — Progress Notes (Signed)
Subjective:    Patient ID: Jonathan [name], male    DOB: 1941-06-10, 73 y.o.   MRN: [id number]  HPI Here for f/u of leukocytosis and pos ua (cx grew staph aureus) from last visit  cxr was clear of infiltrated   Lab Results  Component Value Date   WBC 17.9 Repeated and verified X2.* 02/01/2014   HGB 15.6 02/01/2014   HCT 46.5 02/01/2014   MCV 88.2 02/01/2014   PLT 327.0 02/01/2014      Today ua is improved Results for orders placed in visit on 02/21/14  POCT URINALYSIS DIPSTICK      Result Value Ref Range   Color, UA yellow     Clarity, UA clear     Glucose, UA neg.     Bilirubin, UA neg.     Ketones, UA neg.     Spec Grav, UA 1.015     Blood, UA Moderate     pH, UA 6.0     Protein, UA Trace     Urobilinogen, UA 0.2     Nitrite, UA neg.     Leukocytes, UA Trace     still shows mod blood  No urinary symptoms at all     Wt is up 3 lb with bmi of 28  On keppra- he thinks it does make him very tired  F/u with neuro Dr Delice Lesch on 6/1   Patient Active Problem List   Diagnosis Date Noted  . Leukocytosis, unspecified 02/03/2014  . Seizures 12/26/2013  . Type II or unspecified type diabetes mellitus with neurological manifestations, not stated as uncontrolled 12/20/2013  . Chronic respiratory failure 12/15/2013  . Aspiration pneumonia 12/15/2013  . Expressive aphasia 12/14/2013  . Syncope 11/28/2013  . Acute encephalopathy 11/28/2013  . Respiratory failure 11/28/2013  . Immunization not carried out because of patient refusal 07/05/2012  . HLD (hyperlipidemia)   . Pulmonary nodule, right   . Allergic rhinitis   . ED (erectile dysfunction)   . Asthma   . Tobacco abuse   . COPD 03/29/2010  . HYPERTENSION 01/05/2007   Past Medical History  Diagnosis Date  . HTN (hypertension)   . HLD (hyperlipidemia)   . Pulmonary nodule, right     lower lobe  . COPD (chronic obstructive pulmonary disease)   . Rotator cuff injury     right  . Frozen shoulder   . Shoulder pain     . Hyperkalemia   . Dermatitis seborrheica   . Other diseases of lung, not elsewhere classified   . Kidney stone   . Allergic rhinitis   . ED (erectile dysfunction)   . Diabetes mellitus type II   . Asthma   . Tobacco abuse   . Stroke     pt states "last year"   Past Surgical History  Procedure Laterality Date  . Cervical discectomy  1998  . Ett  11/1994  . Tm repair    . Tee without cardioversion  12/03/2011    Procedure: TRANSESOPHAGEAL ECHOCARDIOGRAM (TEE);  Surgeon: Lelon Perla, MD;  Location: [facility] ENDOSCOPY;  Service: Cardiovascular;  Laterality: N/A;   History  Substance Use Topics  . Smoking status: Current Some Day Smoker -- 0.20 packs/day    Types: Cigarettes  . Smokeless tobacco: Never Used     Comment: prev smoked 1 1/2 PPD. Currently 2 cigs daily  . Alcohol Use: No     Comment: Quit 40 years ago   Family History  Problem Relation Age  of Onset  . Asthma Sister   . Emphysema Sister    Allergies  Allergen Reactions  . Amoxicillin-Pot Clavulanate Other (See Comments)    Severe stomach pain.   . Quinapril Hcl     REACTION: back pain  . Valsartan     REACTION: back pain  . Varenicline Tartrate     REACTION: AMS, hallucinations, night mares   Current Outpatient Prescriptions on File Prior to Visit  Medication Sig Dispense Refill  . albuterol (PROVENTIL) (2.5 MG/3ML) 0.083% nebulizer solution Take 3 mLs (2.5 mg total) by nebulization 2 (two) times daily as needed for shortness of breath.  75 mL  0  . albuterol (VENTOLIN HFA) 108 (90 BASE) MCG/ACT inhaler Inhale 2 puffs into the lungs every 6 (six) hours as needed for wheezing.  1 Inhaler  11  . amLODipine (NORVASC) 5 MG tablet Take 5 mg by mouth daily.      [name] aspirin 81 MG tablet Take 1 tablet (81 mg total) by mouth daily.  30 tablet  0  . atorvastatin (LIPITOR) 10 MG tablet Take 10 mg by mouth daily.      [name] glipiZIDE (GLIPIZIDE XL) 10 MG 24 hr tablet Take 1 tablet (10 mg total) by mouth daily.  90 tablet  0   . guaiFENesin (MUCINEX) 600 MG 12 hr tablet Take 1 tablet (600 mg total) by mouth 2 (two) times daily.  15 tablet  0  . insulin lispro (HUMALOG) 100 UNIT/ML KiwkPen Inject 5 Units into the skin 3 (three) times daily with meals. For cbg >=150      . levETIRAcetam (KEPPRA) 500 MG tablet Take 1 tablet (500 mg total) by mouth 2 (two) times daily.  60 tablet  0  . metFORMIN (GLUCOPHAGE) 1000 MG tablet Take 500-1,000 mg by mouth See admin instructions. Take 1 tablet in the morning, 0.5 tablet midday and 1 tablet in the evening      . tiotropium (SPIRIVA HANDIHALER) 18 MCG inhalation capsule Place 1 capsule (18 mcg total) into inhaler and inhale daily.  30 capsule  11   No current facility-administered medications on file prior to visit.    Review of Systems Review of Systems  Constitutional: Negative for fever, appetite change,  and unexpected weight change. pos for fatigue/ sleeping a lot  Eyes: Negative for pain and visual disturbance.  Respiratory: Negative for cough and shortness of breath.   Cardiovascular: Negative for cp or palpitations    Gastrointestinal: Negative for nausea, diarrhea and constipation.  Genitourinary: Negative for urgency and frequency.  Skin: Negative for pallor or rash   Neurological: Negative for weakness, numbness and headaches. pos for some dizziness/ unsteadiness after positional change  Hematological: Negative for adenopathy. Does not bruise/bleed easily.  Psychiatric/Behavioral: Negative for dysphoric mood. The patient is not nervous/anxious.  notes less irritability        Objective:   Physical Exam  Constitutional: He appears well-developed and well-nourished. No distress.  Somewhat frail appearing elderly male - improved from prev exam   HENT:  Head: Normocephalic and atraumatic.  Mouth/Throat: Oropharynx is clear and moist.  Eyes: Conjunctivae and EOM are normal. Pupils are equal, round, and reactive to light. Right eye exhibits no discharge. Left eye  exhibits no discharge. No scleral icterus.  Neck: Normal range of motion. Neck supple. No JVD present. Carotid bruit is not present.  Cardiovascular: Normal rate, regular rhythm, normal heart sounds and intact distal pulses.  Exam reveals no gallop.   Pulmonary/Chest: Effort normal.  No respiratory distress. He has wheezes. He has no rales.  Diffusely distant bs  Scant wheezes occasionally - on exp  Mildly prolonged exp phase  Abdominal: Soft. Bowel sounds are normal. He exhibits no distension, no abdominal bruit and no mass. There is no tenderness. There is no rebound, no guarding and no CVA tenderness.  Musculoskeletal: He exhibits no edema.  Lymphadenopathy:    He has no cervical adenopathy.  Neurological: He is alert.  Skin: Skin is warm and dry. No rash noted. No erythema. No pallor.  Psychiatric: He has a normal mood and affect.  Affect is brighter and more spirited Talkative today          Assessment & Plan:

## 2014-02-21 NOTE — Progress Notes (Signed)
Pre visit review using our clinic review tool, if applicable. No additional management support is needed unless otherwise documented below in the visit note. 

## 2014-02-21 NOTE — Assessment & Plan Note (Signed)
Smoking has picked up a bit -pt is not sure how much  Disc in detail risks of smoking and possible outcomes including copd, vascular/ heart disease, cancer , respiratory and sinus infections  Pt voices understanding

## 2014-02-21 NOTE — Assessment & Plan Note (Signed)
Pt has some unsteadiness when getting up at times BP: 132/72 mmHg  Suspect this may be from keppra-he has f/u with neuro soon  Enc to use walker to prevent falls in the meantime

## 2014-02-21 NOTE — Assessment & Plan Note (Signed)
Lab Results  Component Value Date   WBC 17.9 Repeated and verified X2.* 02/01/2014   treated for uti after this and cxr was stable  Re check today with improved ua

## 2014-02-22 ENCOUNTER — Telehealth: Payer: Self-pay | Admitting: Family Medicine

## 2014-02-22 LAB — CBC WITH DIFFERENTIAL/PLATELET
Basophils Absolute: 0 10*3/uL (ref 0.0–0.1)
Basophils Relative: 0.2 % (ref 0.0–3.0)
Eosinophils Absolute: 0.2 10*3/uL (ref 0.0–0.7)
Eosinophils Relative: 1.4 % (ref 0.0–5.0)
HCT: 48 % (ref 39.0–52.0)
Hemoglobin: 16 g/dL (ref 13.0–17.0)
Lymphocytes Relative: 22.4 % (ref 12.0–46.0)
Lymphs Abs: 3.4 10*3/uL (ref 0.7–4.0)
MCHC: 33.4 g/dL (ref 30.0–36.0)
MCV: 87.9 fl (ref 78.0–100.0)
Monocytes Absolute: 0.7 10*3/uL (ref 0.1–1.0)
Monocytes Relative: 4.8 % (ref 3.0–12.0)
Neutro Abs: 10.8 10*3/uL — ABNORMAL HIGH (ref 1.4–7.7)
Neutrophils Relative %: 71.2 % (ref 43.0–77.0)
Platelets: 336 10*3/uL (ref 150.0–400.0)
RBC: 5.46 Mil/uL (ref 4.22–5.81)
RDW: 14.7 % (ref 11.5–15.5)
WBC: 15.2 10*3/uL — ABNORMAL HIGH (ref 4.0–10.5)

## 2014-02-22 LAB — URINE CULTURE
Colony Count: NO GROWTH
Organism ID, Bacteria: NO GROWTH

## 2014-02-22 NOTE — Telephone Encounter (Signed)
Relevant patient education mailed to patient.  

## 2014-02-23 ENCOUNTER — Other Ambulatory Visit: Payer: Self-pay

## 2014-02-23 MED ORDER — LEVETIRACETAM 500 MG PO TABS: 500.0000 mg | ORAL_TABLET | Freq: Two times a day (BID) | ORAL | Status: DC

## 2014-02-23 NOTE — Telephone Encounter (Signed)
Jonathan [name] request refill Keppra to [facility]; pt took last pill this AM and request refill to be done today. Milas Hock said last refill was hospital doctor. Pt has appt with neurologist on 02/27/14.

## 2014-02-23 NOTE — Telephone Encounter (Signed)
Will refill electronically  

## 2014-02-23 NOTE — Telephone Encounter (Signed)
Pt notified Rx sent to pharmacy

## 2014-02-27 ENCOUNTER — Ambulatory Visit (INDEPENDENT_AMBULATORY_CARE_PROVIDER_SITE_OTHER): Payer: Medicare Other | Admitting: Neurology

## 2014-02-27 ENCOUNTER — Encounter: Payer: Self-pay | Admitting: Neurology

## 2014-02-27 VITALS — BP 130/76 | HR 108 | Temp 97.5°F | Ht 67.0 in | Wt 180.0 lb

## 2014-02-27 DIAGNOSIS — I679 Cerebrovascular disease, unspecified: Secondary | ICD-10-CM

## 2014-02-27 DIAGNOSIS — R569 Unspecified convulsions: Secondary | ICD-10-CM

## 2014-02-27 MED ORDER — LAMOTRIGINE 100 MG PO TABS: ORAL_TABLET | ORAL | Status: DC

## 2014-02-27 MED ORDER — LAMOTRIGINE 25 MG PO TABS: ORAL_TABLET | ORAL | Status: DC

## 2014-02-27 MED ORDER — LEVETIRACETAM 500 MG PO TABS: 500.0000 mg | ORAL_TABLET | Freq: Two times a day (BID) | ORAL | Status: DC

## 2014-02-27 NOTE — Patient Instructions (Addendum)
1. Schedule 24-hour EEG 2. Start Lamotrigine 25mg  tabs: Take 1 tablet twice a day for 2 weeks, then increase to 2 tablets twice a day for 2 weeks. After this, start Lamotrigine 100mg  tabs: Take 1 tablet twice a day 3. Continue Keppra 500mg  twice a day for 6 weeks, then once on Lamotrigine 100mg  tabs, start reducing Keppra 500mg  to 1 tablet daily for 1 week, then 1 tablet every other day for 1 week, then stop 4. Increase aspirin 325mg  daily 5. Continue control of glucose, blood pressure  Seizure Precautions: 1. If medication has been prescribed for you to prevent seizures, take it exactly as directed.  Do not stop taking the medicine without talking to your doctor first, even if you have not had a seizure in a long time.   2. Avoid activities in which a seizure would cause danger to yourself or to others.  Don't operate dangerous machinery, swim alone, or climb in high or dangerous places, such as on ladders, roofs, or girders.  Do not drive unless your doctor says you may.  3. If you have any warning that you may have a seizure, lay down in a safe place where you can't hurt yourself.    4.  No driving for 6 months from last seizure, as per [facility].   Please refer to the following link on the [facility] website for more information: http://www.epilepsyfoundation.org/answerplace/Social/driving/drivingu.cfm   5.  Maintain good sleep hygiene.  6.  Contact your doctor if you have any problems that may be related to the medicine you are taking.  7.  Call 911 and bring the patient back to the ED if:        A.  The seizure lasts longer than 5 minutes.       B.  The patient doesn't awaken shortly after the seizure  C.  The patient has new problems such as difficulty seeing, speaking or moving  D.  The patient was injured during the seizure  E.  The patient has a temperature over 102 F (39C)  F.  The patient vomited and now is having trouble breathing

## 2014-02-27 NOTE — Progress Notes (Signed)
NEUROLOGY CONSULTATION NOTE  Jonathan [name] MRN: [id number] DOB: August 25, 1941  Referring provider: Dr. Loura Pardon Primary care provider: Dr. Loura Pardon  Reason for consult:  Seizures, side effects on Keppra  Dear Dr Glori Bickers:  Thank you for your kind referral of Jonathan [name] for consultation of the above symptoms. Although his history is well known to you, please allow me to reiterate it for the purpose of our medical record. The patient was accompanied to the clinic by his wife who also provides collateral information. Records and images were personally reviewed where available.  HISTORY OF PRESENT ILLNESS: This is a 73 year old right-handed man with a history of hypertension, hyperlipidemia, diabetes, COPD, stroke in 2013, presenting for recurrent episodes of confusion.  He had presented to the ER on 11/23/13 for productive cough and hypoxia, and was admitted for 3 days for COPD exacerbation.  His wife reports that he appeared confused that day, he did not seem to comprehend what she was saying, with a blank stare, and could not get his shirt on.  He was discharged home on home O2, and they report that he continued to have some difficulty breathing and felt diffusely weak.  On 11/28/13, his wife heard a fall and found him halfway in the bathroom tub, incoherent with eyes open, unresponsive to questions.  He had bladder and bowel incontinence and was apparently agitated and fighting EMS.  He does not recall this, and does not recall much of his 10-day hospitalization with diagnosis of acute on chronic respiratory failure.  He was discharged home, then again returned on 12/14/13 when home PT noted confusion and agitation.  His wife reports that he was confused and his attitude changes ("so mean"), he was telling PT to get out of their house.  In the ER, he was evaluated by neurologist Dr. 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  CT head and MRI brain which  I personally reviewed, no evidence of acute infarct.  There was extensive chronic small vessel changes and small old right occipital cortical infarction. His MRA showed atherosclerotic narrowing and irregularity of both carotid siphon regions, severely diseased right A1 segment which does not definitely show flow, both anterior cerebral arteries are appear well supplied from the left carotid system. Severely diseased left vertebral artery. The right vertebral artery is dominant and widely patent to the basilar. Posterior circulation branch vessels are patent but show atherosclerotic irregularity. His routine EEG showed focal delta and theta slowing most prominent at F7, T7. He was started on Keppra 500mg  BID.  He was apparently unable to communicate for 3-4 days, his wife recalls him lying on the bed and not saying anything, then "he just started talking."  He reports he was aware people were around "but barely."  He denied any focal numbness/tingling/weakness.  Since hospital discharge, there have been no further similar confusional episodes in the past 2 months.  He does feel that the Keppra has "knocked me down a little bit," he has daytime drowsiness and feels dizzy.  His wife notes a change in personality, he is agitated more and "snaps back," "he is not the person I have known for 20 years."  He has noted memory changes since then, with difficulty recalling his niece's name, dates, events from the day prior, or medications even with pillbox.  He feels that since starting Keppra, he has been having more mild headaches.  No nausea/vomiting/photo/phonophobia.  He denies any diplopia, dysarthria,  dysphagia, he has chronic left thumb and index finger numbness from neck surgery.  He reports lightheadedness and feels that his balance is different since the events, no falls. He denies any olfactory/gustatory hallucinations, deja vu, rising epigastric sensation, recurrent focal numbness/tingling/weakness, myoclonic  jerks.  Epilepsy Risk Factors:  He had embolic-appearing strokes in the right hemisphere in March 2013 felt to be due to right carotid disease.  TEE negative.  His brother had one seizure at age 37. Otherwise he had a normal birth and early development.  There is no history of febrile convulsions, CNS infections such as meningitis/encephalitis, significant traumatic brain injury, neurosurgical procedures.  Laboratory Data:  Lab Results  Component Value Date   WBC 15.2* 02/21/2014   HGB 16.0 02/21/2014   HCT 48.0 02/21/2014   MCV 87.9 02/21/2014   PLT 336.0 02/21/2014     Chemistry      Component Value Date/Time   NA 136 02/01/2014 1305   K 3.6 02/01/2014 1305   CL 98 02/01/2014 1305   CO2 27 02/01/2014 1305   BUN 12 02/01/2014 1305   CREATININE 0.9 02/01/2014 1305      Component Value Date/Time   CALCIUM 9.2 02/01/2014 1305   ALKPHOS 62 12/14/2013 1435   AST 20 12/14/2013 1435   ALT 45 12/14/2013 1435   BILITOT 1.3* 12/14/2013 1435     Lab Results  Component Value Date   CHOL 132 12/15/2013   HDL 62 12/15/2013   LDLCALC 37 12/15/2013   LDLDIRECT 75.4 09/30/2012   TRIG 164* 12/15/2013   CHOLHDL 2.1 12/15/2013   Lab Results  Component Value Date   HGBA1C 8.8* 12/17/2013    PAST MEDICAL HISTORY: Past Medical History  Diagnosis Date  . HTN (hypertension)   . HLD (hyperlipidemia)   . Pulmonary nodule, right     lower lobe  . COPD (chronic obstructive pulmonary disease)   . Rotator cuff injury     right  . Frozen shoulder   . Shoulder pain   . Hyperkalemia   . Dermatitis seborrheica   . Other diseases of lung, not elsewhere classified   . Kidney stone   . Allergic rhinitis   . ED (erectile dysfunction)   . Diabetes mellitus type II   . Asthma   . Tobacco abuse   . Stroke     pt states "last year"    PAST SURGICAL HISTORY: Past Surgical History  Procedure Laterality Date  . Cervical discectomy  1998  . Ett  11/1994  . Tm repair    . Tee without cardioversion  12/03/2011     Procedure: TRANSESOPHAGEAL ECHOCARDIOGRAM (TEE);  Surgeon: Lelon Perla, MD;  Location: [facility] ENDOSCOPY;  Service: Cardiovascular;  Laterality: N/A;    MEDICATIONS: Current Outpatient Prescriptions on File Prior to Visit  Medication Sig Dispense Refill  . albuterol (PROVENTIL) (2.5 MG/3ML) 0.083% nebulizer solution Take 3 mLs (2.5 mg total) by nebulization 2 (two) times daily as needed for shortness of breath.  75 mL  0  . albuterol (VENTOLIN HFA) 108 (90 BASE) MCG/ACT inhaler Inhale 2 puffs into the lungs every 6 (six) hours as needed for wheezing.  1 Inhaler  11  . amLODipine (NORVASC) 5 MG tablet Take 5 mg by mouth daily.      [name] aspirin 81 MG tablet Take 1 tablet (81 mg total) by mouth daily.  30 tablet  0  . atorvastatin (LIPITOR) 10 MG tablet Take 10 mg by mouth daily.      [name]  glipiZIDE (GLIPIZIDE XL) 10 MG 24 hr tablet Take 1 tablet (10 mg total) by mouth daily.  90 tablet  0  . guaiFENesin (MUCINEX) 600 MG 12 hr tablet Take 1 tablet (600 mg total) by mouth 2 (two) times daily.  15 tablet  0  . insulin glargine (LANTUS) 100 unit/mL SOPN Inject 40 Units into the skin daily at 8 pm.      . insulin lispro (HUMALOG) 100 UNIT/ML KiwkPen Inject 5 Units into the skin 3 (three) times daily with meals. For cbg >=150      . metFORMIN (GLUCOPHAGE) 1000 MG tablet Take 500-1,000 mg by mouth See admin instructions. Take 1 tablet in the morning, 0.5 tablet midday and 1 tablet in the evening      . tiotropium (SPIRIVA HANDIHALER) 18 MCG inhalation capsule Place 1 capsule (18 mcg total) into inhaler and inhale daily.  30 capsule  11   No current facility-administered medications on file prior to visit.    ALLERGIES: Allergies  Allergen Reactions  . Amoxicillin-Pot Clavulanate Other (See Comments)    Severe stomach pain.   . Quinapril Hcl     REACTION: back pain  . Valsartan     REACTION: back pain  . Varenicline Tartrate     REACTION: AMS, hallucinations, night mares    FAMILY HISTORY: Family  History  Problem Relation Age of Onset  . Asthma Sister   . Emphysema Sister     SOCIAL HISTORY: History   Social History  . Marital Status: Married    Spouse Name: N/A    Number of Children: N/A  . Years of Education: N/A   Occupational History  . Retired     Air cabin crew   Social History Main Topics  . Smoking status: Current Some Day Smoker -- 0.20 packs/day    Types: Cigarettes  . Smokeless tobacco: Never Used     Comment: prev smoked 1 1/2 PPD. Currently 2 cigs daily  . Alcohol Use: No     Comment: Quit 40 years ago  . Drug Use: No  . Sexual Activity: Not on file   Other Topics Concern  . Not on file   Social History Narrative  . No narrative on file    REVIEW OF SYSTEMS: Constitutional: No fevers, chills, or sweats, + generalized fatigue,no change in appetite Eyes: No visual changes, double vision, eye pain Ear, nose and throat: No hearing loss, ear pain, nasal congestion, sore throat Cardiovascular: No chest pain, palpitations Respiratory:  No shortness of breath at rest or with exertion, wheezes GastrointestinaI: No nausea, vomiting, diarrhea, abdominal pain, fecal incontinence Genitourinary:  No dysuria, urinary retention or frequency Musculoskeletal:  + neck pain, back pain Integumentary: No rash, pruritus, skin lesions Neurological: as above Psychiatric: No depression, insomnia, anxiety Endocrine: No palpitations, fatigue, diaphoresis, mood swings, change in appetite, change in weight, increased thirst Hematologic/Lymphatic:  No anemia, purpura, petechiae. Allergic/Immunologic: no itchy/runny eyes, nasal congestion, recent allergic reactions, rashes  PHYSICAL EXAM: Filed Vitals:   02/27/14 1005  BP: 130/76  Pulse: 108  Temp: 97.5 F (36.4 C)   General: No acute distress Head:  Normocephalic/atraumatic Eyes: Fundoscopic exam shows bilateral sharp discs, no vessel changes, exudates, or hemorrhages Neck: supple, no paraspinal tenderness, full  range of motion Back: No paraspinal tenderness Heart: regular rate and rhythm Lungs: Clear to auscultation bilaterally. Vascular: No carotid bruits. Skin/Extremities: No rash, no edema Neurological Exam: Mental status: alert and oriented to person, place, and time, no dysarthria or aphasia,  Fund of knowledge is appropriate.  Recent and remote memory are intact.  Attention and concentration are normal.    Able to name objects and repeat phrases. Cranial nerves: CN I: not tested CN II: pupils equal, round and reactive to light, visual fields intact, fundi unremarkable. CN III, IV, VI:  full range of motion, no nystagmus, no ptosis CN V: facial sensation intact CN VII: upper and lower face symmetric CN VIII: hearing intact to finger rub CN IX, X: gag intact, uvula midline CN XI: sternocleidomastoid and trapezius muscles intact CN XII: tongue midline Bulk & Tone: normal, no cogwheeling, no fasciculations. Motor: 5/5 throughout with no pronator drift. Sensation: decreased pin and vibration sense in both feet in stocking distribution up to ankles. Intact to cold. Intact to all modalities on both UE.  Romberg test negative Deep Tendon Reflexes: +2 throughout except for absent bilateral ankle jerks, no ankle clonus Plantar responses: downgoing bilaterally Cerebellar: no incoordination on finger to nose with endpoint tremor bilaterally Gait: narrow-based and steady, able to tandem walk adequately. 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 The two episodes appeared to be related to pulmonary process, however the last episode was concerning for partial seizure with prolonged expressive and receptive aphasia, MRI brain  unremarkable, with EEG showing focal left temporal slowing.  He was started on Keppra 500mg  BID with no further similar symptoms in the past 2 months.  He feels lightheaded and tired on the Keppra, and his wife has expressed concern about a significant personality change.  He would benefit to switching to a different AED with mood-stabilizing properties such as Lamictal.  Side effects were discussed with the patient, including Stevens-Johnson syndrome, he is concerned about the dizziness, however is agreeable to starting low dose with slow uptitration. He will be scheduled for ambulatory 24-hour EEG to further classify his seizures and help guide long-term medication management.  [place] driving laws were discussed with the patient, and he knows to stop driving after a seizure, until 6 months seizure-free.  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  He will follow-up in 3 months.tion of care.  Thank you for allowing me to participate in the care of this patient. Please do not hesitate to call for any questions or concerns.   Ellouise Newer, M.D.

## 2014-03-01 ENCOUNTER — Encounter: Payer: Self-pay | Admitting: Neurology

## 2014-03-07 ENCOUNTER — Other Ambulatory Visit: Payer: Self-pay | Admitting: *Deleted

## 2014-03-07 MED ORDER — ATORVASTATIN CALCIUM 10 MG PO TABS: 10.0000 mg | ORAL_TABLET | Freq: Every day | ORAL | Status: DC

## 2014-03-07 MED ORDER — AMLODIPINE BESYLATE 5 MG PO TABS: 5.0000 mg | ORAL_TABLET | Freq: Every day | ORAL | Status: DC

## 2014-03-07 MED ORDER — GLIPIZIDE ER 10 MG PO TB24: 10.0000 mg | ORAL_TABLET | Freq: Every day | ORAL | Status: DC

## 2014-03-07 MED ORDER — METFORMIN HCL 1000 MG PO TABS: ORAL_TABLET | ORAL | Status: DC

## 2014-04-11 ENCOUNTER — Ambulatory Visit (INDEPENDENT_AMBULATORY_CARE_PROVIDER_SITE_OTHER): Payer: Medicare Other | Admitting: Neurology

## 2014-04-11 DIAGNOSIS — R569 Unspecified convulsions: Secondary | ICD-10-CM

## 2014-04-11 DIAGNOSIS — I679 Cerebrovascular disease, unspecified: Secondary | ICD-10-CM

## 2014-04-14 ENCOUNTER — Emergency Department (HOSPITAL_COMMUNITY)
Admission: EM | Admit: 2014-04-14 | Discharge: 2014-04-15 | Disposition: A | Discharge status: Home / Self Care | Payer: Medicare Other | Attending: Emergency Medicine | Admitting: Emergency Medicine

## 2014-04-14 ENCOUNTER — Encounter (HOSPITAL_COMMUNITY): Payer: Self-pay | Admitting: Emergency Medicine

## 2014-04-14 DIAGNOSIS — I10 Essential (primary) hypertension: Secondary | ICD-10-CM | POA: Insufficient documentation

## 2014-04-14 DIAGNOSIS — Z8739 Personal history of other diseases of the musculoskeletal system and connective tissue: Secondary | ICD-10-CM | POA: Insufficient documentation

## 2014-04-14 DIAGNOSIS — Z7982 Long term (current) use of aspirin: Secondary | ICD-10-CM | POA: Insufficient documentation

## 2014-04-14 DIAGNOSIS — J449 Chronic obstructive pulmonary disease, unspecified: Secondary | ICD-10-CM | POA: Insufficient documentation

## 2014-04-14 DIAGNOSIS — Z79899 Other long term (current) drug therapy: Secondary | ICD-10-CM | POA: Insufficient documentation

## 2014-04-14 DIAGNOSIS — J4489 Other specified chronic obstructive pulmonary disease: Secondary | ICD-10-CM | POA: Insufficient documentation

## 2014-04-14 DIAGNOSIS — E785 Hyperlipidemia, unspecified: Secondary | ICD-10-CM | POA: Insufficient documentation

## 2014-04-14 DIAGNOSIS — Z794 Long term (current) use of insulin: Secondary | ICD-10-CM | POA: Insufficient documentation

## 2014-04-14 DIAGNOSIS — H6091 Unspecified otitis externa, right ear: Secondary | ICD-10-CM

## 2014-04-14 DIAGNOSIS — Z880 Allergy status to penicillin: Secondary | ICD-10-CM | POA: Insufficient documentation

## 2014-04-14 DIAGNOSIS — Z87828 Personal history of other (healed) physical injury and trauma: Secondary | ICD-10-CM | POA: Insufficient documentation

## 2014-04-14 DIAGNOSIS — Z8673 Personal history of transient ischemic attack (TIA), and cerebral infarction without residual deficits: Secondary | ICD-10-CM | POA: Insufficient documentation

## 2014-04-14 DIAGNOSIS — F17200 Nicotine dependence, unspecified, uncomplicated: Secondary | ICD-10-CM | POA: Insufficient documentation

## 2014-04-14 DIAGNOSIS — Z87442 Personal history of urinary calculi: Secondary | ICD-10-CM | POA: Insufficient documentation

## 2014-04-14 DIAGNOSIS — Z872 Personal history of diseases of the skin and subcutaneous tissue: Secondary | ICD-10-CM | POA: Insufficient documentation

## 2014-04-14 DIAGNOSIS — E119 Type 2 diabetes mellitus without complications: Secondary | ICD-10-CM | POA: Insufficient documentation

## 2014-04-14 DIAGNOSIS — Z87448 Personal history of other diseases of urinary system: Secondary | ICD-10-CM | POA: Insufficient documentation

## 2014-04-14 DIAGNOSIS — H60399 Other infective otitis externa, unspecified ear: Secondary | ICD-10-CM | POA: Insufficient documentation

## 2014-04-14 MED ORDER — OXYCODONE-ACETAMINOPHEN 5-325 MG PO TABS: 1.0000 | ORAL_TABLET | ORAL | Status: DC | PRN

## 2014-04-14 MED ORDER — OXYCODONE-ACETAMINOPHEN 5-325 MG PO TABS
2.0000 | ORAL_TABLET | Freq: Once | ORAL | Status: AC
  Administered 2014-04-14: 2 via ORAL
  Filled 2014-04-14: qty 2

## 2014-04-14 MED ORDER — IBUPROFEN 200 MG PO TABS
600.0000 mg | ORAL_TABLET | Freq: Once | ORAL | Status: AC
  Administered 2014-04-14: 600 mg via ORAL
  Filled 2014-04-14: qty 3

## 2014-04-14 NOTE — ED Provider Notes (Signed)
CSN: [id number]     Arrival date & time 04/14/14  2138 History   First MD Initiated Contact with Patient 04/14/14 2208     Chief Complaint  Patient presents with  . Otalgia     (Consider location/radiation/quality/duration/timing/severity/associated sxs/prior Treatment) HPI  73 year old male with right ear pain. Onset around [place]. Sharp and severe pain. Associated with some watery drainage. No fevers or chills. Denies any trauma. Patient is a diabetic. Patient reports decreased hearing over the past couple days. Has been the same over-the-counter ceruminolytic and feeling warm water flow into ear while in the shower the past week an effort to remove severe wax.   Past Medical History  Diagnosis Date  . HTN (hypertension)   . HLD (hyperlipidemia)   . Pulmonary nodule, right     lower lobe  . COPD (chronic obstructive pulmonary disease)   . Rotator cuff injury     right  . Frozen shoulder   . Shoulder pain   . Hyperkalemia   . Dermatitis seborrheica   . Other diseases of lung, not elsewhere classified   . Kidney stone   . Allergic rhinitis   . ED (erectile dysfunction)   . Diabetes mellitus type II   . Asthma   . Tobacco abuse   . Stroke     pt states "last year"   Past Surgical History  Procedure Laterality Date  . Cervical discectomy  1998  . Ett  11/1994  . Tm repair    . Tee without cardioversion  12/03/2011    Procedure: TRANSESOPHAGEAL ECHOCARDIOGRAM (TEE);  Surgeon: Lelon Perla, MD;  Location: [facility] ENDOSCOPY;  Service: Cardiovascular;  Laterality: N/A;  . Eeg     Family History  Problem Relation Age of Onset  . Asthma Sister   . Emphysema Sister    History  Substance Use Topics  . Smoking status: Current Some Day Smoker -- 0.20 packs/day    Types: Cigarettes  . Smokeless tobacco: Never Used     Comment: prev smoked 1 1/2 PPD. Currently 2 cigs daily  . Alcohol Use: No     Comment: Quit 40 years ago    Review of Systems    Allergies   Amoxicillin-pot clavulanate; Quinapril hcl; Valsartan; and Varenicline tartrate  Home Medications   Prior to Admission medications   Medication Sig Start Date End Date Taking? Authorizing Provider  albuterol (PROVENTIL) (2.5 MG/3ML) 0.083% nebulizer solution Take 3 mLs (2.5 mg total) by nebulization 2 (two) times daily as needed for shortness of breath. 11/26/13  Yes Shanker Kristeen Mans, MD  albuterol (VENTOLIN HFA) 108 (90 BASE) MCG/ACT inhaler Inhale 2 puffs into the lungs every 6 (six) hours as needed for wheezing. 02/01/14  Yes Abner Greenspan, MD  amLODipine (NORVASC) 5 MG tablet Take 1 tablet (5 mg total) by mouth daily. 03/07/14  Yes Abner Greenspan, MD  aspirin 81 MG tablet Take 1 tablet (81 mg total) by mouth daily. 11/26/13  Yes Shanker Kristeen Mans, MD  atorvastatin (LIPITOR) 10 MG tablet Take 1 tablet (10 mg total) by mouth daily. 03/07/14  Yes Abner Greenspan, MD  glipiZIDE (GLIPIZIDE XL) 10 MG 24 hr tablet Take 1 tablet (10 mg total) by mouth daily. 03/07/14  Yes Abner Greenspan, MD  insulin glargine (LANTUS) 100 unit/mL SOPN Inject 40 Units into the skin daily at 8 pm. 12/19/13  Yes Orson Eva, MD  insulin lispro (HUMALOG) 100 UNIT/ML KiwkPen Inject 5 Units into the skin 3 (three) times  daily with meals. For cbg >=150 12/08/13  Yes Corey Harold, NP  lamoTRIgine (LAMICTAL) 100 MG tablet Take 100 mg by mouth 2 (two) times daily.   Yes Historical Provider, MD  levETIRAcetam (KEPPRA) 500 MG tablet Take 500 mg by mouth every other day.   Yes Historical Provider, MD  metFORMIN (GLUCOPHAGE) 1000 MG tablet Take 500-1,000 mg by mouth 3 (three) times daily. Take 1 tablet in the morning Take 0.5 tablet midday Take 1 tablet in the evening   Yes Historical Provider, MD  tiotropium (SPIRIVA HANDIHALER) 18 MCG inhalation capsule Place 1 capsule (18 mcg total) into inhaler and inhale daily. 02/01/14  Yes Abner Greenspan, MD  oxyCODONE-acetaminophen (PERCOCET/ROXICET) 5-325 MG per tablet Take 1-2 tablets by mouth every 4  (four) hours as needed for severe pain. 04/14/14   Virgel Manifold, MD   BP 145/71  Pulse 85  Temp(Src) 97.7 F (36.5 C) (Oral)  Resp 20  Ht 5\' 8"  (1.727 m)  Wt 185 lb (83.915 kg)  BMI 28.14 kg/m2  SpO2 93% Physical Exam  Nursing note and vitals reviewed. Constitutional: He appears well-developed and well-nourished. No distress.  HENT:  Head: Normocephalic and atraumatic.  Right external auditory canal is very swollen to the point where I cannot visualize beyond it. Thin, watery drainage. Increased pain with manipulation of his pinna. No peri-auricular skin changes. No mastoid tenderness. Neck is supple.  Eyes: Conjunctivae are normal. Right eye exhibits no discharge. Left eye exhibits no discharge.  Neck: Neck supple.  Cardiovascular: Normal rate, regular rhythm and normal heart sounds.  Exam reveals no gallop and no friction rub.   No murmur heard. Pulmonary/Chest: Effort normal and breath sounds normal. No respiratory distress.  Abdominal: Soft. He exhibits no distension. There is no tenderness.  Musculoskeletal: He exhibits no edema and no tenderness.  Neurological: He is alert.  Skin: Skin is warm and dry.  Psychiatric: He has a normal mood and affect. His behavior is normal. Thought content normal.    ED Course  Procedures (including critical care time) Labs Review Labs Reviewed - No data to display  Imaging Review No results found.   EKG Interpretation None      MDM   Final diagnoses:  Otitis externa, right     73 year old male with right ear pain. Clinically and otitis externa. Possibly started by his efforts for cerumen removal at home and earache he is here for extended periods of time on the shower. Ear wick placed and ciprodex administered. Bottle given to pt. Instructed to use BID for 10 days. As needed narcotic. Return precautions were discussed.    Virgel Manifold, MD 04/20/14 1146

## 2014-04-14 NOTE — Discharge Instructions (Signed)
Use Ciprodex 4 drops into R ear twice per day.   Otitis Externa Otitis externa is a bacterial or fungal infection of the outer ear canal. This is the area from the eardrum to the outside of the ear. Otitis externa is sometimes called "swimmer's ear." CAUSES  Possible causes of infection include:  Swimming in dirty water.  Moisture remaining in the ear after swimming or bathing.  Mild injury (trauma) to the ear.  Objects stuck in the ear (foreign body).  Cuts or scrapes (abrasions) on the outside of the ear. SYMPTOMS  The first symptom of infection is often itching in the ear canal. Later signs and symptoms may include swelling and redness of the ear canal, ear pain, and yellowish-white fluid (pus) coming from the ear. The ear pain may be worse when pulling on the earlobe. DIAGNOSIS  Your caregiver will perform a physical exam. A sample of fluid may be taken from the ear and examined for bacteria or fungi. TREATMENT  Antibiotic ear drops are often given for 10 to 14 days. Treatment may also include pain medicine or corticosteroids to reduce itching and swelling. PREVENTION   Keep your ear dry. Use the corner of a towel to absorb water out of the ear canal after swimming or bathing.  Avoid scratching or putting objects inside your ear. This can damage the ear canal or remove the protective wax that lines the canal. This makes it easier for bacteria and fungi to grow.  Avoid swimming in lakes, polluted water, or poorly chlorinated pools.  You may use ear drops made of rubbing alcohol and vinegar after swimming. Combine equal parts of white vinegar and alcohol in a bottle. Put 3 or 4 drops into each ear after swimming. HOME CARE INSTRUCTIONS   Apply antibiotic ear drops to the ear canal as prescribed by your caregiver.  Only take over-the-counter or prescription medicines for pain, discomfort, or fever as directed by your caregiver.  If you have diabetes, follow any additional  treatment instructions from your caregiver.  Keep all follow-up appointments as directed by your caregiver. SEEK MEDICAL CARE IF:   You have a fever.  Your ear is still red, swollen, painful, or draining pus after 3 days.  Your redness, swelling, or pain gets worse.  You have a severe headache.  You have redness, swelling, pain, or tenderness in the area behind your ear. MAKE SURE YOU:   Understand these instructions.  Will watch your condition.  Will get help right away if you are not doing well or get worse. Document Released: 09/15/2005 Document Revised: 12/08/2011 Document Reviewed: 10/02/2011 [facility] Patient Information 2015 [place], [place]. This information is not intended to replace advice given to you by your health care provider. Make sure you discuss any questions you have with your health care provider.

## 2014-04-14 NOTE — ED Notes (Signed)
Pt c/o R ear pain onset 1500 today.

## 2014-04-17 ENCOUNTER — Telehealth: Payer: Self-pay | Admitting: *Deleted

## 2014-04-17 NOTE — Telephone Encounter (Signed)
[place] Triage Call Report Triage Record Num: [id number] Operator: Aurelio Jew Patient Name: Jonathan [name] Call Date & Time: 04/14/2014 8:40:56PM Patient Phone: [phone] PCP: Patient Gender: Male PCP Fax : Patient DOB: Jan 21, 1941 Practice Name: [facility] Reason for Call: Caller: Faith/Other; PCP: Other; CB#: [phone]; Call regarding Ear pain; Reports ear pain since March 2015 has not been treated because per caller, "he had other things going" inclduing seizues, ? stroke hospitilized for 4 weeks in the ICU. Now caller stating that patient w/ worsening pain, swelling, and particles coming from out of the ears after gtts given. Triaged per VOZ:[id number]. Disposition is to See provider within 4 hours. MDO closed at this time. Instructed patient to be seen in ED tonight for evaluation. Caller verbalizes understanding. Agreeable to plan. Protocol(s) Used: Ear: Symptoms Recommended Outcome per Protocol: See Provider within 4 hours Reason for Outcome: Swelling, tenderness, OR redness of visible portion of outer ear Care Advice: ~ SYMPTOM / CONDITION MANAGEMENT 04/14/2014 8:57:45PM Page 1 of 1 [id number]

## 2014-04-17 NOTE — Procedures (Signed)
ELECTROENCEPHALOGRAM REPORT  Dates of Recording: 04/11/2014 to 04/12/2014  Patient's Name: Jonathan [name] MRN: [id number] Date of Birth: 1941-02-08  Referring Provider: Dr. Ellouise Newer  Procedure: 24-hour ambulatory EEG  History: This is a 73 year old man with recurrent episodes of confusion in a 1 month period, during one of these he had prolonged expressive and receptive aphasia. Routine EEG reported focal left temporal slowing. EEG for classification.  Medications: Keppra  Technical Summary: This is a 24-hour multichannel digital EEG recording measured by the international 10-20 system with electrodes applied with paste and impedances below 5000 ohms performed as portable with EKG monitoring.  The digital EEG was referentially recorded, reformatted, and digitally filtered in a variety of bipolar and referential montages for optimal display.    DESCRIPTION OF RECORDING: During maximal wakefulness, the background activity consisted of a symmetric 9 Hz posterior dominant rhythm which was reactive to eye opening.  There were no epileptiform discharges or focal slowing seen in wakefulness.  During the recording, the patient progresses through wakefulness, drowsiness, and Stage 2 sleep.  Again, there were no epileptiform discharges seen.  Events: There were no push button events.  Patient did not report any typical events or symptoms.  There were no electrographic seizures seen.  EKG lead was unremarkable.  IMPRESSION: This 24-hour ambulatory EEG study is normal.    CLINICAL CORRELATION: A normal EEG does not exclude a clinical diagnosis of epilepsy. Typical episodes were not captured.  Clinical correlation is advised.   Ellouise Newer, M.D.

## 2014-04-18 ENCOUNTER — Telehealth: Payer: Self-pay | Admitting: Neurology

## 2014-04-18 NOTE — Telephone Encounter (Signed)
Patient notified

## 2014-04-18 NOTE — Telephone Encounter (Signed)
Pt returning a call. Unsure why or who called. Possibly calling w/ testing results. CB# [phone] / Sherri S.

## 2014-04-27 ENCOUNTER — Other Ambulatory Visit: Payer: Self-pay | Admitting: Family Medicine

## 2014-04-27 DIAGNOSIS — I10 Essential (primary) hypertension: Secondary | ICD-10-CM

## 2014-04-27 DIAGNOSIS — D72829 Elevated white blood cell count, unspecified: Secondary | ICD-10-CM

## 2014-04-28 ENCOUNTER — Other Ambulatory Visit (INDEPENDENT_AMBULATORY_CARE_PROVIDER_SITE_OTHER): Payer: Medicare Other

## 2014-04-28 DIAGNOSIS — IMO0001 Reserved for inherently not codable concepts without codable children: Secondary | ICD-10-CM

## 2014-04-28 DIAGNOSIS — IMO0002 Reserved for concepts with insufficient information to code with codable children: Secondary | ICD-10-CM

## 2014-04-28 DIAGNOSIS — E1165 Type 2 diabetes mellitus with hyperglycemia: Secondary | ICD-10-CM

## 2014-04-28 DIAGNOSIS — D72829 Elevated white blood cell count, unspecified: Secondary | ICD-10-CM

## 2014-04-28 DIAGNOSIS — I10 Essential (primary) hypertension: Secondary | ICD-10-CM

## 2014-04-28 LAB — CBC WITH DIFFERENTIAL/PLATELET
Basophils Absolute: 0 10*3/uL (ref 0.0–0.1)
Basophils Relative: 0.3 % (ref 0.0–3.0)
Eosinophils Absolute: 0.4 10*3/uL (ref 0.0–0.7)
Eosinophils Relative: 2.5 % (ref 0.0–5.0)
HCT: 52 % (ref 39.0–52.0)
Hemoglobin: 17.1 g/dL — ABNORMAL HIGH (ref 13.0–17.0)
Lymphocytes Relative: 37.9 % (ref 12.0–46.0)
Lymphs Abs: 5.4 10*3/uL — ABNORMAL HIGH (ref 0.7–4.0)
MCHC: 32.8 g/dL (ref 30.0–36.0)
MCV: 86.5 fl (ref 78.0–100.0)
Monocytes Absolute: 1 10*3/uL (ref 0.1–1.0)
Monocytes Relative: 6.9 % (ref 3.0–12.0)
Neutro Abs: 7.5 10*3/uL (ref 1.4–7.7)
Neutrophils Relative %: 52.4 % (ref 43.0–77.0)
Platelets: 330 10*3/uL (ref 150.0–400.0)
RBC: 6.02 Mil/uL — ABNORMAL HIGH (ref 4.22–5.81)
RDW: 13.8 % (ref 11.5–15.5)
WBC: 14.4 10*3/uL — ABNORMAL HIGH (ref 4.0–10.5)

## 2014-04-28 LAB — MICROALBUMIN / CREATININE URINE RATIO
Creatinine,U: 127.9 mg/dL
Microalb Creat Ratio: 12.7 mg/g (ref 0.0–30.0)
Microalb, Ur: 16.2 mg/dL — ABNORMAL HIGH (ref 0.0–1.9)

## 2014-04-28 LAB — COMPREHENSIVE METABOLIC PANEL
ALT: 11 U/L (ref 0–53)
AST: 21 U/L (ref 0–37)
Albumin: 4.3 g/dL (ref 3.5–5.2)
Alkaline Phosphatase: 64 U/L (ref 39–117)
BUN: 13 mg/dL (ref 6–23)
CO2: 29 mEq/L (ref 19–32)
Calcium: 9.5 mg/dL (ref 8.4–10.5)
Chloride: 100 mEq/L (ref 96–112)
Creatinine, Ser: 1.1 mg/dL (ref 0.4–1.5)
GFR: 68.15 mL/min (ref >60.00)
Glucose, Bld: 197 mg/dL — ABNORMAL HIGH (ref 70–99)
Potassium: 4.9 mEq/L (ref 3.5–5.1)
Sodium: 140 mEq/L (ref 135–145)
Total Bilirubin: 0.7 mg/dL (ref 0.2–1.2)
Total Protein: 7.7 g/dL (ref 6.0–8.3)

## 2014-04-28 LAB — HEMOGLOBIN A1C: Hgb A1c MFr Bld: 8.2 % — ABNORMAL HIGH (ref 4.6–6.5)

## 2014-05-05 ENCOUNTER — Encounter: Payer: Self-pay | Admitting: Family Medicine

## 2014-05-05 ENCOUNTER — Ambulatory Visit (INDEPENDENT_AMBULATORY_CARE_PROVIDER_SITE_OTHER): Payer: Medicare Other | Admitting: Family Medicine

## 2014-05-05 VITALS — BP 128/68 | HR 109 | Temp 98.1°F | Ht 67.0 in | Wt 183.5 lb

## 2014-05-05 DIAGNOSIS — Z91199 Patient's noncompliance with other medical treatment and regimen due to unspecified reason: Secondary | ICD-10-CM | POA: Insufficient documentation

## 2014-05-05 DIAGNOSIS — F17200 Nicotine dependence, unspecified, uncomplicated: Secondary | ICD-10-CM

## 2014-05-05 DIAGNOSIS — IMO0001 Reserved for inherently not codable concepts without codable children: Secondary | ICD-10-CM

## 2014-05-05 DIAGNOSIS — D72829 Elevated white blood cell count, unspecified: Secondary | ICD-10-CM

## 2014-05-05 DIAGNOSIS — IMO0002 Reserved for concepts with insufficient information to code with codable children: Secondary | ICD-10-CM

## 2014-05-05 DIAGNOSIS — E1165 Type 2 diabetes mellitus with hyperglycemia: Secondary | ICD-10-CM

## 2014-05-05 DIAGNOSIS — Z720 Tobacco use: Secondary | ICD-10-CM

## 2014-05-05 DIAGNOSIS — Z9119 Patient's noncompliance with other medical treatment and regimen: Secondary | ICD-10-CM

## 2014-05-05 NOTE — Assessment & Plan Note (Signed)
Back to smoking 1/2 ppd Copd worse/unable to exercise Disc in detail risks of smoking and possible outcomes including copd, vascular/ heart disease, cancer , respiratory and sinus infections  Pt voices understanding Pt states he cannot quit

## 2014-05-05 NOTE — Assessment & Plan Note (Signed)
Improved  Lab Results  Component Value Date   WBC 14.4* 04/28/2014    Will continue to watch Has been tx for uti and ear infx

## 2014-05-05 NOTE — Assessment & Plan Note (Signed)
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

## 2014-05-05 NOTE — Assessment & Plan Note (Signed)
See overview Expressed to him today that without effort from him his health will continue to decline

## 2014-05-05 NOTE — Progress Notes (Signed)
Subjective:    Patient ID: Jonathan [name], male    DOB: 1941-07-23, 73 y.o.   MRN: [id number]  HPI Here for f/u of chronic health problems  Feeling pretty good overall   Having hip and back pain - wants a refill of oxycodone he got in ER for another problem a long time ago  Acute and chronic  1/2 pill of oxycodone 5-325 helps a lot   He saw the neurologist -for seizure  Goes back in Sept - she changed him from keppra to lamictal - and tolerates this better and weaning it down   Wt is down 2 lb with bmi of 28    Leukocytosis Lab Results  Component Value Date   WBC 14.4* 04/28/2014   HGB 17.1* 04/28/2014   HCT 52.0 04/28/2014   MCV 86.5 04/28/2014   PLT 330.0 04/28/2014   uti was treated and this is improving HB is high from smoking   bp is stable today  No cp or palpitations or headaches or edema  No side effects to medicines  BP Readings from Last 3 Encounters:  05/05/14 128/68  04/14/14 156/70  02/27/14 130/76     Diabetes Home sugar results - running 120s usually in evening -- he checks glucose every other night  DM diet - not much appetite, he eats liver pudding (not 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 UTI (urinary tract infection) 02/21/2014  . Leukocytosis, unspecified 02/03/2014  . Seizures 12/26/2013  . Type II or unspecified type diabetes mellitus with neurological manifestations, not stated as uncontrolled 12/20/2013  . Chronic respiratory  failure 12/15/2013  . Aspiration pneumonia 12/15/2013  . Expressive aphasia 12/14/2013  . Syncope 11/28/2013  . Acute encephalopathy 11/28/2013  . Respiratory failure 11/28/2013  . Immunization not carried out because of patient refusal 07/05/2012  . HLD (hyperlipidemia)   . Pulmonary nodule, right   . Allergic rhinitis   . ED (erectile dysfunction)   . Asthma   . Tobacco abuse   . COPD 03/29/2010  . HYPERTENSION 01/05/2007   Past Medical History  Diagnosis Date  . HTN (hypertension)   . HLD (hyperlipidemia)   . Pulmonary nodule, right     lower lobe  . COPD (chronic obstructive pulmonary disease)   . Rotator cuff injury     right  . Frozen shoulder   . Shoulder pain   . Hyperkalemia   . Dermatitis seborrheica   . Other diseases of lung, not elsewhere classified   . Kidney stone   . Allergic rhinitis   . ED (erectile dysfunction)   . Diabetes mellitus type II   . Asthma   . Tobacco abuse   . Stroke     pt states "last year"   Past Surgical History  Procedure Laterality Date  . Cervical discectomy  1998  . Ett  11/1994  . Tm  repair    . Tee without cardioversion  12/03/2011    Procedure: TRANSESOPHAGEAL ECHOCARDIOGRAM (TEE);  Surgeon: Lelon Perla, MD;  Location: [facility] ENDOSCOPY;  Service: Cardiovascular;  Laterality: N/A;  . Eeg     History  Substance Use Topics  . Smoking status: Current Some Day Smoker -- 0.20 packs/day    Types: Cigarettes  . Smokeless tobacco: Never Used     Comment: prev smoked 1 1/2 PPD. Currently 2 cigs daily  . Alcohol Use: No     Comment: Quit 40 years ago   Family History  Problem Relation Age of Onset  . Asthma Sister   . Emphysema Sister    Allergies  Allergen Reactions  . Amoxicillin-Pot Clavulanate Other (See Comments)    Severe stomach pain.   . Quinapril Hcl     REACTION: back pain  . Valsartan     REACTION: back pain  . Varenicline Tartrate     REACTION: AMS, hallucinations, night mares   Current Outpatient  Prescriptions on File Prior to Visit  Medication Sig Dispense Refill  . albuterol (PROVENTIL) (2.5 MG/3ML) 0.083% nebulizer solution Take 3 mLs (2.5 mg total) by nebulization 2 (two) times daily as needed for shortness of breath.  75 mL  0  . albuterol (VENTOLIN HFA) 108 (90 BASE) MCG/ACT inhaler Inhale 2 puffs into the lungs every 6 (six) hours as needed for wheezing.  1 Inhaler  11  . amLODipine (NORVASC) 5 MG tablet Take 1 tablet (5 mg total) by mouth daily.  90 tablet  3  . aspirin 81 MG tablet Take 1 tablet (81 mg total) by mouth daily.  30 tablet  0  . atorvastatin (LIPITOR) 10 MG tablet Take 1 tablet (10 mg total) by mouth daily.  90 tablet  3  . glipiZIDE (GLIPIZIDE XL) 10 MG 24 hr tablet Take 1 tablet (10 mg total) by mouth daily.  90 tablet  0  . insulin glargine (LANTUS) 100 unit/mL SOPN Inject 40 Units into the skin daily at 8 pm.      . insulin lispro (HUMALOG) 100 UNIT/ML KiwkPen Inject 5 Units into the skin 3 (three) times daily with meals. For cbg >=150      . lamoTRIgine (LAMICTAL) 100 MG tablet Take 100 mg by mouth 2 (two) times daily.      [name] levETIRAcetam (KEPPRA) 500 MG tablet Take 500 mg by mouth every other day.      . metFORMIN (GLUCOPHAGE) 1000 MG tablet Take 500-1,000 mg by mouth 3 (three) times daily. Take 1 tablet in the morning Take 0.5 tablet midday Take 1 tablet in the evening      . oxyCODONE-acetaminophen (PERCOCET/ROXICET) 5-325 MG per tablet Take 1-2 tablets by mouth every 4 (four) hours as needed for severe pain.  12 tablet  0  . tiotropium (SPIRIVA HANDIHALER) 18 MCG inhalation capsule Place 1 capsule (18 mcg total) into inhaler and inhale daily.  30 capsule  11   No current facility-administered medications on file prior to visit.     Review of Systems    Review of Systems  Constitutional: Negative for fever, appetite change, fatigue and unexpected weight change.  Eyes: Negative for pain and visual disturbance.  Respiratory: Negative for cough and and  pos for sob  Cardiovascular: Negative for cp or palpitations    Gastrointestinal: Negative for nausea, diarrhea and constipation.  Genitourinary: Negative for urgency and frequency. neg for excessive thirst  Skin: Negative for pallor or rash  Neurological: Negative for weakness, light-headedness, numbness and headaches.  Hematological: Negative for adenopathy. Does not bruise/bleed easily.  Psychiatric/Behavioral: Negative for dysphoric mood. The patient is not nervous/anxious.      Objective:   Physical Exam  Constitutional: He appears well-developed and well-nourished. No distress.  obese and well appearing   HENT:  Head: Normocephalic and atraumatic.  Mouth/Throat: Oropharynx is clear and moist.  Eyes: Conjunctivae and EOM are normal. Pupils are equal, round, and reactive to light. Right eye exhibits no discharge. Left eye exhibits no discharge.  Neck: Normal range of motion. Neck supple. No JVD present. Carotid bruit is not present. No thyromegaly present.  Cardiovascular: Normal rate, regular rhythm, normal heart sounds and intact distal pulses.  Exam reveals no gallop.   Pulmonary/Chest: Effort normal and breath sounds normal. No respiratory distress. He has no wheezes. He exhibits no tenderness.  Diffusely distant bs  Harsh bs with scant wheeze and slt prolonged exp phase   Abdominal: Soft. Bowel sounds are normal. He exhibits no distension, no abdominal bruit and no mass. There is no tenderness.  Musculoskeletal: He exhibits no edema and no tenderness.  Lymphadenopathy:    He has no cervical adenopathy.  Neurological: He is alert. He has normal reflexes. No cranial nerve deficit. He exhibits normal muscle tone. Coordination normal.  No tremor   Skin: Skin is warm and dry. No rash noted. No erythema. No pallor.  Psychiatric: He has a normal mood and affect.          Assessment & Plan:   Problem List Items Addressed This Visit     Other   Tobacco abuse     Back to  smoking 1/2 ppd Copd worse/unable to exercise Disc in detail risks of smoking and possible outcomes including copd, vascular/ heart disease, cancer , respiratory and sinus infections  Pt voices understanding Pt states he cannot quit     Leukocytosis, unspecified - Primary      Improved  Lab Results  Component Value Date   WBC 14.4* 04/28/2014    Will continue to watch Has been tx for uti and ear infx     Medically noncompliant     See overview Expressed to him today that without effort from him his health will continue to decline    RESOLVED: Diabetes type 2, uncontrolled      Noncompliant with diet  Disc this in detail-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

## 2014-05-05 NOTE — Progress Notes (Signed)
Pre visit review using our clinic review tool, if applicable. No additional management support is needed unless otherwise documented below in the visit note. 

## 2014-05-05 NOTE — Patient Instructions (Signed)
Please try to quit smoking - your breathing is getting worse and worse   Your white blood cell count is improving-we will continue to follow it   Diabetes is not in good control  Check sugar three times daily - am fasting / 2 hours after a meal and bedtime - and then send me results in 1-2 weeks -we will decide what to do with your medicines (primarily lantus )   Follow up in 3 months with labs prior  If you want to see someone for your back and hip please let me know

## 2014-05-31 ENCOUNTER — Telehealth: Payer: Self-pay | Admitting: Family Medicine

## 2014-05-31 NOTE — Telephone Encounter (Signed)
Reading placed in your inbox

## 2014-05-31 NOTE — Telephone Encounter (Signed)
Caregiver dropped off glucose readings Put on shapale's desk

## 2014-05-31 NOTE — Telephone Encounter (Signed)
It looks like (on avg with some exceptions) morning sugar is pretty good, 2 hour past meal sugar is generally higher and the 2 am sugar is the highest   Is he eating a bedtime snack?  How is eating in general - any changes since I saw you last? (in terms of portions/ DM diet)?  Let me know please

## 2014-06-01 NOTE — Telephone Encounter (Signed)
Pt notified of Dr. Marliss Coots comments about blood sugar logs.  Pt said that in general his diet hasn't changed much since the last time he was here. Pt said he does occasionally eat a snack before bed, and he has started back eating oatmeal and, will have an occasional snack during the day but it's usually something like nuts.

## 2014-06-01 NOTE — Telephone Encounter (Signed)
Increase the lantus by 5 units-see how you do with a week and then if no low sugars- increase by another 5 units Watch diet carefully and do not eat a pm snack Update me if other problems  Follow up roughly 3 mo from last visit with lab prior if not already scheduled Keep checking glucose Keep using the quick acting insulin as needed/ as you have

## 2014-06-02 ENCOUNTER — Ambulatory Visit (INDEPENDENT_AMBULATORY_CARE_PROVIDER_SITE_OTHER): Payer: Medicare Other | Admitting: Neurology

## 2014-06-02 ENCOUNTER — Encounter: Payer: Self-pay | Admitting: Neurology

## 2014-06-02 VITALS — BP 110/74 | HR 91 | Resp 30 | Ht 67.0 in | Wt 187.0 lb

## 2014-06-02 DIAGNOSIS — R569 Unspecified convulsions: Secondary | ICD-10-CM

## 2014-06-02 DIAGNOSIS — I679 Cerebrovascular disease, unspecified: Secondary | ICD-10-CM

## 2014-06-02 MED ORDER — LAMOTRIGINE 100 MG PO TABS: 100.0000 mg | ORAL_TABLET | Freq: Two times a day (BID) | ORAL | Status: DC

## 2014-06-02 NOTE — Progress Notes (Signed)
NEUROLOGY FOLLOW UP OFFICE NOTE  Jonathan [name] [id number]  HISTORY OF PRESENT ILLNESS: I had the pleasure of seeing Jonathan [name] in follow-up in the neurology clinic on 06/02/2014.  The patient was last seen 3 months ago for recurrent episodes of confusion, concerning for seizures.  He was taking Keppra but having side effects of irritability and headaches.  He is again accompanied by his wife today.  On his initial visit, due to side effects on Keppra, he was switched to Lamictal and given a tapering schedule for Keppra. His 24-hour EEG was normal. He has since discontinued the Keppra and takes Lamictal 100mg  BID with no side effects. The irritability and headaches have resolved.  They deny any further episodes of confusion, staring/unresponsiveness, aphasia. He denies any headaches, focal numbness/tingling/weakness, olfactory/gustatory hallucinations. He denies any diplopia. He has occasional dizziness on standing, no falls.  HPI:  This is a pleasant 73 yo RH man with a history of hypertension, hyperlipidemia, diabetes, COPD, stroke in 2013, who presented for recurrent episodes of confusion. He went to the ER on 11/23/13 for productive cough and hypoxia, and was admitted for 3 days for COPD exacerbation. His wife reports that he appeared confused that day, he did not seem to comprehend what she was saying, with a blank stare, and could not get his shirt on. He was discharged home on home O2, and they report that he continued to have some difficulty breathing and felt diffusely weak. On 11/28/13, his wife heard a fall and found him halfway in the bathroom tub, incoherent with eyes open, unresponsive to questions. He had bladder and bowel incontinence and was apparently agitated and fighting EMS. He does not recall this, and does not recall much of his 10-day hospitalization with diagnosis of acute on chronic respiratory failure. He was discharged home, then again returned on 12/14/13 when home PT noted  confusion and agitation. His wife reports that he was confused and his attitude changes ("so mean"), he was telling PT to get out of their house. In the ER, he was evaluated by neurologist Dr. 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 CT head and MRI brain which I personally reviewed, no evidence of acute infarct. There was extensive chronic small vessel changes and small old right occipital cortical infarction. His MRA showed atherosclerotic narrowing and irregularity of both carotid siphon regions, severely diseased right A1 segment which does not definitely show flow, both anterior cerebral arteries are appear well supplied from the left carotid system. Severely diseased left vertebral artery. The right vertebral artery is dominant and widely patent to the basilar. Posterior circulation branch vessels are patent but show atherosclerotic irregularity. His routine EEG showed focal delta and theta slowing most prominent at F7, T7. He was started on Keppra 500mg  BID.   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 TEE negative. His brother had one seizure at age 52. Otherwise he had a normal birth and early development. There is no history of febrile convulsions, CNS infections such as meningitis/encephalitis, significant traumatic brain injury, neurosurgical procedures.   PAST MEDICAL HISTORY: Past Medical History  Diagnosis Date  . HTN (hypertension)   . HLD (hyperlipidemia)   . Pulmonary nodule, right     lower lobe  . COPD (chronic obstructive pulmonary disease)   . Rotator cuff injury     right  . Frozen shoulder   .  Shoulder pain   . Hyperkalemia   . Dermatitis seborrheica   . Other diseases of lung, not elsewhere classified   . Kidney stone   . Allergic rhinitis   . ED (erectile dysfunction)   . Diabetes mellitus type II   . Asthma   . Tobacco abuse   .  Stroke     pt states "last year"    MEDICATIONS: Current Outpatient Prescriptions on File Prior to Visit  Medication Sig Dispense Refill  . albuterol (PROVENTIL) (2.5 MG/3ML) 0.083% nebulizer solution Take 3 mLs (2.5 mg total) by nebulization 2 (two) times daily as needed for shortness of breath.  75 mL  0  . albuterol (VENTOLIN HFA) 108 (90 BASE) MCG/ACT inhaler Inhale 2 puffs into the lungs every 6 (six) hours as needed for wheezing.  1 Inhaler  11  . amLODipine (NORVASC) 5 MG tablet Take 1 tablet (5 mg total) by mouth daily.  90 tablet  3  . aspirin 81 MG tablet Take 1 tablet (81 mg total) by mouth daily.  30 tablet  0  . atorvastatin (LIPITOR) 10 MG tablet Take 1 tablet (10 mg total) by mouth daily.  90 tablet  3  . glipiZIDE (GLIPIZIDE XL) 10 MG 24 hr tablet Take 1 tablet (10 mg total) by mouth daily.  90 tablet  0  . insulin glargine (LANTUS) 100 unit/mL SOPN Inject 40 Units into the skin daily at 8 pm.      . insulin lispro (HUMALOG) 100 UNIT/ML KiwkPen Inject 5 Units into the skin 3 (three) times daily with meals. For cbg >=150      . metFORMIN (GLUCOPHAGE) 1000 MG tablet Take 500-1,000 mg by mouth 3 (three) times daily. Take 1 tablet in the morning Take 0.5 tablet midday Take 1 tablet in the evening       No current facility-administered medications on file prior to visit.    ALLERGIES: Allergies  Allergen Reactions  . Amoxicillin-Pot Clavulanate Other (See Comments)    Severe stomach pain.   . Quinapril Hcl     REACTION: back pain  . Valsartan     REACTION: back pain  . Varenicline Tartrate     REACTION: AMS, hallucinations, night mares    FAMILY HISTORY: Family History  Problem Relation Age of Onset  . Asthma Sister   . Emphysema Sister     SOCIAL HISTORY: History   Social History  . Marital Status: Married    Spouse Name: N/A    Number of Children: N/A  . Years of Education: N/A   Occupational History  . Retired     Air cabin crew   Social History  Main Topics  . Smoking status: Current Some Day Smoker -- 0.20 packs/day    Types: Cigarettes  . Smokeless tobacco: Never Used     Comment: prev smoked 1 1/2 PPD. Currently 2 cigs daily  . Alcohol Use: No     Comment: Quit 40 years ago  . Drug Use: No  . Sexual Activity: Not on file   Other Topics Concern  . Not on file   Social History Narrative  . No narrative on file    REVIEW OF SYSTEMS: Constitutional: No fevers, chills, or sweats, no generalized fatigue, change in appetite Eyes: No visual changes, double vision, eye pain Ear, nose and throat: No hearing loss, ear pain, nasal congestion, sore throat Cardiovascular: No chest pain, palpitations Respiratory:  +shortness of breath with exertion, wheezes on exertion GastrointestinaI: No nausea, vomiting,  diarrhea, abdominal pain, fecal incontinence Genitourinary:  No dysuria, urinary retention or frequency Musculoskeletal:  No neck pain, back pain Integumentary: No rash, pruritus, skin lesions Neurological: as above Psychiatric: No depression, insomnia, anxiety Endocrine: No palpitations, fatigue, diaphoresis, mood swings, change in appetite, change in weight, increased thirst Hematologic/Lymphatic:  No anemia, purpura, petechiae. Allergic/Immunologic: no itchy/runny eyes, nasal congestion, recent allergic reactions, rashes  PHYSICAL EXAM: Filed Vitals:   06/02/14 1418  BP: 110/74  Pulse: 91  Resp: 30   General: No acute distress Head:  Normocephalic/atraumatic Neck: supple, no paraspinal tenderness, full range of motion Heart:  Regular rate and rhythm Lungs:  Clear to auscultation bilaterally Back: No paraspinal tenderness Skin/Extremities: No rash, no edema Neurological Exam: alert and oriented to person, place, and time, no dysarthria or aphasia, Fund of knowledge is appropriate. Recent and remote memory are intact. Attention and concentration are normal. Able to name objects and repeat phrases.  Cranial nerves:    CN I: not tested  CN II: pupils equal, round and reactive to light, visual fields intact, fundi unremarkable.  CN III, IV, VI: full range of motion, no nystagmus, no ptosis  CN V: facial sensation intact  CN VII: upper and lower face symmetric  CN VIII: hearing intact to finger rub  CN IX, X: gag intact, uvula midline  CN XI: sternocleidomastoid and trapezius muscles intact  CN XII: tongue midline  Bulk & Tone: normal, no cogwheeling, no fasciculations.  Motor: 5/5 throughout with no pronator drift.  Sensation: Intact to LT. No extinction to double simultaneous stimulation. 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 He had side effects on Keppra, now tolerating Lamictal 100mg  BID with no further episodes of confusion or aphasia. Continue current dose of Lamictal.  He does not drive and understands [place] driving to stop driving after a seizure, until 6 months seizure-free. We again discussed stroke risk factors, intracranial stenosis, and maximum medical management with control of vascular risk factors (hypertension, diabetes, hyperlipidemia, and smoking cessation). He  will follow-up in 6 months or earlier if needed.  Thank you for allowing me to participate in his care.  Please do not hesitate to call for any questions or concerns.  The duration of this appointment visit was 15 minutes of face-to-face time with the patient.  Greater than 50% of this time was spent in counseling, explanation of diagnosis, planning of further management, and coordination of care.   Ellouise Newer, M.D.   CC: Dr. Glori Bickers

## 2014-06-02 NOTE — Patient Instructions (Addendum)
1. Continue Lamictal 100mg  twice a day 2. Continue control of glucose, blood pressure, smoking cessation  Seizure Precautions: 1. If medication has been prescribed for you to prevent seizures, take it exactly as directed.  Do not stop taking the medicine without talking to your doctor first, even if you have not had a seizure in a long time.   2. Avoid activities in which a seizure would cause danger to yourself or to others.  Don't operate dangerous machinery, swim alone, or climb in high or dangerous places, such as on ladders, roofs, or girders.  Do not drive unless your doctor says you may.  3. If you have any warning that you may have a seizure, lay down in a safe place where you can't hurt yourself.    4.  No driving for 6 months from last seizure, as per [facility].   Please refer to the following link on the [place] website for more information: http://[email or web address]/[email or web address]/drivingu.cfm   5.  Maintain good sleep hygiene.  6.  Contact your doctor if you have any problems that may be related to the medicine you are taking.  7.  Call 911 and bring the patient back to the ED if:        A.  The seizure lasts longer than 5 minutes.       B.  The patient doesn't awaken shortly after the seizure  C.  The patient has new problems such as difficulty seeing, speaking or moving  D.  The patient was injured during the seizure  E.  The patient has a temperature over 102 F (39C)  F.  The patient vomited and now is having trouble breathing

## 2014-06-07 NOTE — Telephone Encounter (Signed)
Pt notified of Dr. Marliss Coots comments/recommendations. Pt verbalized understanding. Pt declined to schedule a f/u now, he said he will call back to scheduled that once he knows his schedule

## 2014-08-16 ENCOUNTER — Other Ambulatory Visit: Payer: Self-pay | Admitting: Family Medicine

## 2014-09-01 ENCOUNTER — Other Ambulatory Visit (INDEPENDENT_AMBULATORY_CARE_PROVIDER_SITE_OTHER): Payer: Medicare Other

## 2014-09-01 DIAGNOSIS — D72829 Elevated white blood cell count, unspecified: Secondary | ICD-10-CM

## 2014-09-01 DIAGNOSIS — IMO0002 Reserved for concepts with insufficient information to code with codable children: Secondary | ICD-10-CM

## 2014-09-01 DIAGNOSIS — E1165 Type 2 diabetes mellitus with hyperglycemia: Secondary | ICD-10-CM

## 2014-09-03 LAB — CBC WITH DIFFERENTIAL/PLATELET
Basophils Absolute: 0 10*3/uL (ref 0.0–0.1)
Basophils Relative: 0.3 % (ref 0.0–3.0)
Eosinophils Absolute: 0.5 10*3/uL (ref 0.0–0.7)
Eosinophils Relative: 3.7 % (ref 0.0–5.0)
HCT: 49.4 % (ref 39.0–52.0)
Hemoglobin: 15.8 g/dL (ref 13.0–17.0)
Lymphocytes Relative: 29.9 % (ref 12.0–46.0)
Lymphs Abs: 4.1 10*3/uL — ABNORMAL HIGH (ref 0.7–4.0)
MCHC: 32.1 g/dL (ref 30.0–36.0)
MCV: 88.8 fl (ref 78.0–100.0)
Monocytes Absolute: 0.5 10*3/uL (ref 0.1–1.0)
Monocytes Relative: 3.5 % (ref 3.0–12.0)
Neutro Abs: 8.6 10*3/uL — ABNORMAL HIGH (ref 1.4–7.7)
Neutrophils Relative %: 62.6 % (ref 43.0–77.0)
Platelets: 271 10*3/uL (ref 150.0–400.0)
RBC: 5.56 Mil/uL (ref 4.22–5.81)
RDW: 14.7 % (ref 11.5–15.5)
WBC: 13.7 10*3/uL — ABNORMAL HIGH (ref 4.0–10.5)

## 2014-09-03 LAB — LIPID PANEL
Cholesterol: 132 mg/dL (ref 0–200)
HDL: 45.4 mg/dL (ref >39.00)
LDL Cholesterol: 58 mg/dL (ref 0–99)
NonHDL: 86.6
Total CHOL/HDL Ratio: 3
Triglycerides: 141 mg/dL (ref 0.0–149.0)
VLDL: 28.2 mg/dL (ref 0.0–40.0)

## 2014-09-03 LAB — HEMOGLOBIN A1C: Hgb A1c MFr Bld: 7.5 % — ABNORMAL HIGH (ref 4.6–6.5)

## 2014-09-08 ENCOUNTER — Encounter: Payer: Self-pay | Admitting: Family Medicine

## 2014-09-08 ENCOUNTER — Ambulatory Visit (INDEPENDENT_AMBULATORY_CARE_PROVIDER_SITE_OTHER): Payer: Medicare Other | Admitting: Family Medicine

## 2014-09-08 VITALS — BP 120/62 | HR 92 | Temp 97.5°F | Ht 67.0 in | Wt 189.2 lb

## 2014-09-08 DIAGNOSIS — D72829 Elevated white blood cell count, unspecified: Secondary | ICD-10-CM

## 2014-09-08 DIAGNOSIS — I10 Essential (primary) hypertension: Secondary | ICD-10-CM

## 2014-09-08 DIAGNOSIS — E1165 Type 2 diabetes mellitus with hyperglycemia: Secondary | ICD-10-CM

## 2014-09-08 DIAGNOSIS — Z720 Tobacco use: Secondary | ICD-10-CM

## 2014-09-08 DIAGNOSIS — B029 Zoster without complications: Secondary | ICD-10-CM

## 2014-09-08 DIAGNOSIS — IMO0002 Reserved for concepts with insufficient information to code with codable children: Secondary | ICD-10-CM

## 2014-09-08 DIAGNOSIS — E785 Hyperlipidemia, unspecified: Secondary | ICD-10-CM

## 2014-09-08 DIAGNOSIS — Z8619 Personal history of other infectious and parasitic diseases: Secondary | ICD-10-CM | POA: Insufficient documentation

## 2014-09-08 MED ORDER — VALACYCLOVIR HCL 1 G PO TABS: 1000.0000 mg | ORAL_TABLET | Freq: Three times a day (TID) | ORAL | Status: DC

## 2014-09-08 NOTE — Progress Notes (Signed)
Pre visit review using our clinic review tool, if applicable. No additional management support is needed unless otherwise documented below in the visit note. 

## 2014-09-08 NOTE — Progress Notes (Signed)
Subjective:    Patient ID: Jonathan [name], male    DOB: Nov 16, 1940, 73 y.o.   MRN: [id number]  HPI Here for f/u of chronic medical conditions and also a red spot on buttocks Itchy , not painful, -? If shingles Dressed with abx and bandage  Otherwise feels pretty good   Wt is up 2 lb with bmi of 29  Diabetes Home sugar results - in the am usually runs 70s-80s  (occ 60s) , without symptoms of hypoglycemia , and then evenings 161, close to 200 day after thanksgiving  DM diet - he dislikes vegetables - watching what he eats however (sugar and carbohydrates) Exercise - unable to exercise much  Symptoms-none  A1C last  Lab Results  Component Value Date   HGBA1C 7.5* 09/01/2014  this is down from 8.2   No problems with medications - lantus 45 mg  Renal protection  Last eye exam 7/15   Hyperlipidemia lipitor and diet Lab Results  Component Value Date   CHOL 132 09/01/2014   CHOL 132 12/15/2013   CHOL 98 11/29/2013   Lab Results  Component Value Date   HDL 45.40 09/01/2014   HDL 62 12/15/2013   HDL 53 11/29/2013   Lab Results  Component Value Date   LDLCALC 58 09/01/2014   LDLCALC 37 12/15/2013   LDLCALC 26 11/29/2013   Lab Results  Component Value Date   TRIG 141.0 09/01/2014   TRIG 164* 12/15/2013   TRIG 96 11/29/2013   Lab Results  Component Value Date   CHOLHDL 3 09/01/2014   CHOLHDL 2.1 12/15/2013   CHOLHDL 1.8 11/29/2013   Lab Results  Component Value Date   LDLDIRECT 75.4 09/30/2012   LDLDIRECT 46.8 03/19/2012   LDLDIRECT 88.3 01/21/2011  overall pretty good - ? Why good cholesterol went down / he is sedentary  Hard to exercise due to copd/breathing   bp is stable today  No cp or palpitations or headaches or edema  No side effects to medicines  BP Readings from Last 3 Encounters:  09/08/14 120/62  06/02/14 110/74  05/05/14 128/68      Smoking- not quitting -has not made much progress - he has "not tried"    Leukocytosis Lab Results    Component Value Date   WBC 13.7* 09/01/2014   HGB 15.8 09/01/2014   HCT 49.4 09/01/2014   MCV 88.8 09/01/2014   PLT 271.0 09/01/2014    Improved - and no fever or other symptoms   Patient Active Problem List   Diagnosis Date Noted  . Shingles 09/08/2014  . Medically noncompliant 05/05/2014  . UTI (urinary tract infection) 02/21/2014  . Leukocytosis 02/03/2014  . Seizures 12/26/2013  . Diabetes type 2, uncontrolled 12/20/2013  . Chronic respiratory failure 12/15/2013  . Aspiration pneumonia 12/15/2013  . Expressive aphasia 12/14/2013  . Syncope 11/28/2013  . Acute encephalopathy 11/28/2013  . Respiratory failure 11/28/2013  . Immunization not carried out because of patient refusal 07/05/2012  . HLD (hyperlipidemia)   . Pulmonary nodule, right   . Allergic rhinitis   . ED (erectile dysfunction)   . Asthma   . Tobacco abuse   . COPD 03/29/2010  . Essential hypertension 01/05/2007   Past Medical History  Diagnosis Date  . HTN (hypertension)   . HLD (hyperlipidemia)   . Pulmonary nodule, right     lower lobe  . COPD (chronic obstructive pulmonary disease)   . Rotator cuff injury     right  . Frozen  shoulder   . Shoulder pain   . Hyperkalemia   . Dermatitis seborrheica   . Other diseases of lung, not elsewhere classified   . Kidney stone   . Allergic rhinitis   . ED (erectile dysfunction)   . Diabetes mellitus type II   . Asthma   . Tobacco abuse   . Stroke     pt states "last year"   Past Surgical History  Procedure Laterality Date  . Cervical discectomy  1998  . Ett  11/1994  . Tm repair    . Tee without cardioversion  12/03/2011    Procedure: TRANSESOPHAGEAL ECHOCARDIOGRAM (TEE);  Surgeon: Lelon Perla, MD;  Location[facility] ENDOSCOPY;  Service: Cardiovascular;  Laterality: N/A;  . Eeg     History  Substance Use Topics  . Smoking status: Current Some Day Smoker -- 0.20 packs/day    Types: Cigarettes  . Smokeless tobacco: Never Used     Comment: prev  smoked 1 1/2 PPD. Currently 2 cigs daily  . Alcohol Use: No     Comment: Quit 40 years ago   Family History  Problem Relation Age of Onset  . Asthma Sister   . Emphysema Sister    Allergies  Allergen Reactions  . Amoxicillin-Pot Clavulanate Other (See Comments)    Severe stomach pain.   . Quinapril Hcl     REACTION: back pain  . Valsartan     REACTION: back pain  . Varenicline Tartrate     REACTION: AMS, hallucinations, night mares   Current Outpatient Prescriptions on File Prior to Visit  Medication Sig Dispense Refill  . albuterol (PROVENTIL) (2.5 MG/3ML) 0.083% nebulizer solution Take 3 mLs (2.5 mg total) by nebulization 2 (two) times daily as needed for shortness of breath. 75 mL 0  . albuterol (VENTOLIN HFA) 108 (90 BASE) MCG/ACT inhaler Inhale 2 puffs into the lungs every 6 (six) hours as needed for wheezing. 1 Inhaler 11  . amLODipine (NORVASC) 5 MG tablet Take 1 tablet (5 mg total) by mouth daily. 90 tablet 3  . aspirin 81 MG tablet Take 1 tablet (81 mg total) by mouth daily. 30 tablet 0  . atorvastatin (LIPITOR) 10 MG tablet Take 1 tablet (10 mg total) by mouth daily. 90 tablet 3  . GLIPIZIDE XL 10 MG 24 hr tablet Take 1 tablet by mouth  daily 90 tablet 1  . insulin glargine (LANTUS) 100 unit/mL SOPN Inject 40 Units into the skin daily at 8 pm.    . insulin lispro (HUMALOG) 100 UNIT/ML KiwkPen Inject 5 Units into the skin 3 (three) times daily with meals. For cbg >=150    . lamoTRIgine (LAMICTAL) 100 MG tablet Take 1 tablet (100 mg total) by mouth 2 (two) times daily. 180 tablet 3  . metFORMIN (GLUCOPHAGE) 1000 MG tablet Take 500-1,000 mg by mouth 3 (three) times daily. Take 1 tablet in the morning Take 0.5 tablet midday Take 1 tablet in the evening     No current facility-administered medications on file prior to visit.     Review of Systems Review of Systems  Constitutional: Negative for fever, appetite change, and unexpected weight change.  Eyes: Negative for pain  and visual disturbance.  Respiratory: Negative for cough and pos  shortness of breath (baseline) on any exertion   Cardiovascular: Negative for cp or palpitations    Gastrointestinal: Negative for nausea, diarrhea and constipation.  Genitourinary: Negative for urgency and frequency.  Skin: Negative for pallor or rash  Neurological: Negative for weakness, light-headedness, numbness and headaches.  Hematological: Negative for adenopathy. Does not bruise/bleed easily.  Psychiatric/Behavioral: Negative for dysphoric mood. The patient is not nervous/anxious.         Objective:   Physical Exam  Constitutional: He appears well-developed and well-nourished. No distress.  HENT:  Head: Normocephalic and atraumatic.  Mouth/Throat: Oropharynx is clear and moist.  Eyes: Conjunctivae and EOM are normal. Pupils are equal, round, and reactive to light. No scleral icterus.  Neck: Normal range of motion. Neck supple. No JVD present. Carotid bruit is not present. No thyromegaly present.  Cardiovascular: Normal rate, regular rhythm, normal heart sounds and intact distal pulses.  Exam reveals no gallop.   Pulmonary/Chest: Effort normal. No respiratory distress. He has wheezes. He exhibits no tenderness.  Diffusely distant bs Few wheezes   Abdominal: Soft. Bowel sounds are normal. He exhibits no distension, no abdominal bruit and no mass. There is no tenderness.  Musculoskeletal: He exhibits no edema or tenderness.  Lymphadenopathy:    He has no cervical adenopathy.  Neurological: He is alert. He has normal reflexes. No cranial nerve deficit. He exhibits normal muscle tone. Coordination normal.  Skin: Skin is warm and dry. Rash noted. There is erythema. No pallor.  Cluster of tiny vesicles on L buttock - resembling shingles outbreak Mildly tender  Psychiatric: He has a normal mood and affect.          Assessment & Plan:   Problem List Items Addressed This Visit      Cardiovascular and  Mediastinum   Essential hypertension - Primary    bp in fair control at this time  BP Readings from Last 1 Encounters:  09/08/14 120/62   No changes needed Disc lifstyle change with low sodium diet and exercise  Labs reviewed       Other   Diabetes type 2, uncontrolled    Starting to improve Lab Results  Component Value Date   HGBA1C 7.5* 09/01/2014   However some am glucoses are low  Not checking in pm -this is when I think they go up  inst to check glucose bid-tid and also eat a bedtime snack Get me readings for rev in a mo F/u 3 mo   Again stressed imp of diet and exercise     HLD (hyperlipidemia)    Disc goals for lipids and reasons to control them Rev labs with pt Rev low sat fat diet in detail     Leukocytosis    Lab Results  Component Value Date   WBC 13.7* 09/01/2014   This is coming down  Still no constitutional symptoms Will continue to follow     Shingles    L buttock 1-2 d without excess pain Px valtrex Disc care  Will update if inc in pain  Still not interested in vaccine  Will update if worse or not improving       Relevant Medications      valACYclovir (VALTREX) tablet   Tobacco abuse    Disc in detail risks of smoking and possible outcomes including copd, vascular/ heart disease, cancer , respiratory and sinus infections  Pt voices understanding He is not ready to quit despite activity limiting copd  Not motivated

## 2014-09-08 NOTE — Patient Instructions (Addendum)
You have shingles on buttocks - take valtrex as directed  To prevent am low sugar-start eating a bedtime snack with protein  Keep checking glucose am fasting and pm (2 hours after a meal)  Get me some readings in 1 month  Call earlier if low am sugar persists  Follow up in 3 months with labs prior  Keep working on diet and activity level Please think about quitting smoking!   Shingles Shingles (herpes zoster) is an infection that is caused by the same virus that causes chickenpox (varicella). The infection causes a painful skin rash and fluid-filled blisters, which eventually break open, crust over, and heal. It may occur in any area of the body, but it usually affects only one side of the body or face. The pain of shingles usually lasts about 1 month. However, some people with shingles may develop long-term (chronic) pain in the affected area of the body. Shingles often occurs many years after the person had chickenpox. It is more common:  In people older than 50 years.  In people with weakened immune systems, such as those with HIV, AIDS, or cancer.  In people taking medicines that weaken the immune system, such as transplant medicines.  In people under great stress. CAUSES  Shingles is caused by the varicella zoster virus (VZV), which also causes chickenpox. After a person is infected with the virus, it can remain in the person's body for years in an inactive state (dormant). To cause shingles, the virus reactivates and breaks out as an infection in a nerve root. The virus can be spread from person to person (contagious) through contact with open blisters of the shingles rash. It will only spread to people who have not had chickenpox. When these people are exposed to the virus, they may develop chickenpox. They will not develop shingles. Once the blisters scab over, the person is no longer contagious and cannot spread the virus to others. SIGNS AND SYMPTOMS  Shingles shows up in stages.  The initial symptoms may be pain, itching, and tingling in an area of the skin. This pain is usually described as burning, stabbing, or throbbing.In a few days or weeks, a painful red rash will appear in the area where the pain, itching, and tingling were felt. The rash is usually on one side of the body in a band or belt-like pattern. Then, the rash usually turns into fluid-filled blisters. They will scab over and dry up in approximately 2-3 weeks. Flu-like symptoms may also occur with the initial symptoms, the rash, or the blisters. These may include:  Fever.  Chills.  Headache.  Upset stomach. DIAGNOSIS  Your health care provider will perform a skin exam to diagnose shingles. Skin scrapings or fluid samples may also be taken from the blisters. This sample will be examined under a microscope or sent to a lab for further testing. TREATMENT  There is no specific cure for shingles. Your health care provider will likely prescribe medicines to help you manage the pain, recover faster, and avoid long-term problems. This may include antiviral drugs, anti-inflammatory drugs, and pain medicines. HOME CARE INSTRUCTIONS   Take a cool bath or apply cool compresses to the area of the rash or blisters as directed. This may help with the pain and itching.   Take medicines only as directed by your health care provider.   Rest as directed by your health care provider.  Keep your rash and blisters clean with mild soap and cool water or as directed  by your health care provider.  Do not pick your blisters or scratch your rash. Apply an anti-itch cream or numbing creams to the affected area as directed by your health care provider.  Keep your shingles rash covered with a loose bandage (dressing).  Avoid skin contact with:  Babies.   Pregnant women.   Children with eczema.   Elderly people with transplants.   People with chronic illnesses, such as leukemia or AIDS.   Wear loose-fitting  clothing to help ease the pain of material rubbing against the rash.  Keep all follow-up visits as directed by your health care provider.If the area involved is on your face, you may receive a referral for a specialist, such as an eye doctor (ophthalmologist) or an ear, nose, and throat (ENT) doctor. Keeping all follow-up visits will help you avoid eye problems, chronic pain, or disability.  SEEK IMMEDIATE MEDICAL CARE IF:   You have facial pain, pain around the eye area, or loss of feeling on one side of your face.  You have ear pain or ringing in your ear.  You have loss of taste.  Your pain is not relieved with prescribed medicines.   Your redness or swelling spreads.   You have more pain and swelling.  Your condition is worsening or has changed.   You have a fever. MAKE SURE YOU:  Understand these instructions.  Will watch your condition.  Will get help right away if you are not doing well or get worse. Document Released: 09/15/2005 Document Revised: 01/30/2014 Document Reviewed: 04/29/2012 [facility] Patient Information 2015 [place], [place]. This information is not intended to replace advice given to you by your health care provider. Make sure you discuss any questions you have with your health care provider.

## 2014-09-10 NOTE — Assessment & Plan Note (Signed)
L buttock 1-2 d without excess pain Px valtrex Disc care  Will update if inc in pain  Still not interested in vaccine  Will update if worse or not improving

## 2014-09-10 NOTE — Assessment & Plan Note (Signed)
Disc goals for lipids and reasons to control them Rev labs with pt Rev low sat fat diet in detail   

## 2014-09-10 NOTE — Assessment & Plan Note (Signed)
Lab Results  Component Value Date   WBC 13.7* 09/01/2014   This is coming down  Still no constitutional symptoms Will continue to follow

## 2014-09-10 NOTE — Assessment & Plan Note (Signed)
bp in fair control at this time  BP Readings from Last 1 Encounters:  09/08/14 120/62   No changes needed Disc lifstyle change with low sodium diet and exercise  Labs reviewed

## 2014-09-10 NOTE — Assessment & Plan Note (Signed)
Disc in detail risks of smoking and possible outcomes including copd, vascular/ heart disease, cancer , respiratory and sinus infections  Pt voices understanding He is not ready to quit despite activity limiting copd  Not motivated

## 2014-09-10 NOTE — Assessment & Plan Note (Signed)
Starting to improve Lab Results  Component Value Date   HGBA1C 7.5* 09/01/2014   However some am glucoses are low  Not checking in pm -this is when I think they go up  inst to check glucose bid-tid and also eat a bedtime snack Get me readings for rev in a mo F/u 3 mo   Again stressed imp of diet and exercise

## 2014-10-30 ENCOUNTER — Telehealth: Payer: Self-pay | Admitting: Family Medicine

## 2014-10-30 NOTE — Telephone Encounter (Signed)
I reviewed the glucose readings he sent -overall looking pretty good  I assume 2HAM means 2 hours after meal  Has he had any hypoglycemia since I saw him last?  If not -I will see him as planned in March with labs prior if possible Thanks

## 2014-10-31 NOTE — Telephone Encounter (Signed)
Spoken to patient and inform him of Dr Marliss Coots comments. Patient had not felt any hypoglycemia.

## 2014-11-20 ENCOUNTER — Other Ambulatory Visit: Payer: Self-pay | Admitting: Family Medicine

## 2014-11-30 ENCOUNTER — Telehealth: Payer: Self-pay | Admitting: Family Medicine

## 2014-11-30 DIAGNOSIS — D72829 Elevated white blood cell count, unspecified: Secondary | ICD-10-CM

## 2014-11-30 DIAGNOSIS — E785 Hyperlipidemia, unspecified: Secondary | ICD-10-CM

## 2014-11-30 DIAGNOSIS — E1165 Type 2 diabetes mellitus with hyperglycemia: Secondary | ICD-10-CM

## 2014-11-30 DIAGNOSIS — IMO0002 Reserved for concepts with insufficient information to code with codable children: Secondary | ICD-10-CM

## 2014-11-30 NOTE — Telephone Encounter (Signed)
-----   Message from Ellamae Sia sent at 11/22/2014 11:58 AM EST ----- Regarding: Lab orders for Friday, 3.4.16 F/u labs

## 2014-12-01 ENCOUNTER — Ambulatory Visit: Payer: Medicare Other | Admitting: Neurology

## 2014-12-01 ENCOUNTER — Other Ambulatory Visit (INDEPENDENT_AMBULATORY_CARE_PROVIDER_SITE_OTHER): Payer: Medicare Other

## 2014-12-01 ENCOUNTER — Encounter: Payer: Self-pay | Admitting: Neurology

## 2014-12-01 ENCOUNTER — Ambulatory Visit (INDEPENDENT_AMBULATORY_CARE_PROVIDER_SITE_OTHER): Payer: Medicare Other | Admitting: Neurology

## 2014-12-01 VITALS — BP 128/70 | HR 92 | Temp 98.2°F | Resp 18 | Ht 67.0 in | Wt 192.4 lb

## 2014-12-01 DIAGNOSIS — R569 Unspecified convulsions: Secondary | ICD-10-CM

## 2014-12-01 DIAGNOSIS — I679 Cerebrovascular disease, unspecified: Secondary | ICD-10-CM

## 2014-12-01 DIAGNOSIS — E1165 Type 2 diabetes mellitus with hyperglycemia: Secondary | ICD-10-CM

## 2014-12-01 DIAGNOSIS — IMO0002 Reserved for concepts with insufficient information to code with codable children: Secondary | ICD-10-CM

## 2014-12-01 DIAGNOSIS — D72829 Elevated white blood cell count, unspecified: Secondary | ICD-10-CM

## 2014-12-01 DIAGNOSIS — Z8673 Personal history of transient ischemic attack (TIA), and cerebral infarction without residual deficits: Secondary | ICD-10-CM

## 2014-12-01 DIAGNOSIS — G40209 Localization-related (focal) (partial) symptomatic epilepsy and epileptic syndromes with complex partial seizures, not intractable, without status epilepticus: Secondary | ICD-10-CM

## 2014-12-01 LAB — CBC WITH DIFFERENTIAL/PLATELET
Basophils Absolute: 0 10*3/uL (ref 0.0–0.1)
Basophils Relative: 0.4 % (ref 0.0–3.0)
Eosinophils Absolute: 0.4 10*3/uL (ref 0.0–0.7)
Eosinophils Relative: 2.8 % (ref 0.0–5.0)
HCT: 48.6 % (ref 39.0–52.0)
Hemoglobin: 16.6 g/dL (ref 13.0–17.0)
Lymphocytes Relative: 29.4 % (ref 12.0–46.0)
Lymphs Abs: 4 10*3/uL (ref 0.7–4.0)
MCHC: 34.1 g/dL (ref 30.0–36.0)
MCV: 84 fl (ref 78.0–100.0)
Monocytes Absolute: 0.8 10*3/uL (ref 0.1–1.0)
Monocytes Relative: 5.8 % (ref 3.0–12.0)
Neutro Abs: 8.4 10*3/uL — ABNORMAL HIGH (ref 1.4–7.7)
Neutrophils Relative %: 61.6 % (ref 43.0–77.0)
Platelets: 317 10*3/uL (ref 150.0–400.0)
RBC: 5.79 Mil/uL (ref 4.22–5.81)
RDW: 14.9 % (ref 11.5–15.5)
WBC: 13.6 10*3/uL — ABNORMAL HIGH (ref 4.0–10.5)

## 2014-12-01 LAB — HEMOGLOBIN A1C: Hgb A1c MFr Bld: 7.9 % — ABNORMAL HIGH (ref 4.6–6.5)

## 2014-12-01 MED ORDER — LAMOTRIGINE 100 MG PO TABS: 100.0000 mg | ORAL_TABLET | Freq: Two times a day (BID) | ORAL | Status: DC

## 2014-12-01 NOTE — Patient Instructions (Signed)
1. Continue Lamictal 100mg  twice a day 2. Follow-up in 1 year, call our office for any changes  Seizure Precautions: 1. If medication has been prescribed for you to prevent seizures, take it exactly as directed.  Do not stop taking the medicine without talking to your doctor first, even if you have not had a seizure in a long time.   2. Avoid activities in which a seizure would cause danger to yourself or to others.  Don't operate dangerous machinery, swim alone, or climb in high or dangerous places, such as on ladders, roofs, or girders.  Do not drive unless your doctor says you may.  3. If you have any warning that you may have a seizure, lay down in a safe place where you can't hurt yourself.    4.  No driving for 6 months from last seizure, as per [facility].   Please refer to the following link on the [place] website for more information: http://www.epilepsyfoundation.org/[email or web address]/driving/drivingu.cfm   5.  Maintain good sleep hygiene.  6.  Contact your doctor if you have any problems that may be related to the medicine you are taking.  7.  Call 911 and bring the patient back to the ED if:        A.  The seizure lasts longer than 5 minutes.       B.  The patient doesn't awaken shortly after the seizure  C.  The patient has new problems such as difficulty seeing, speaking or moving  D.  The patient was injured during the seizure  E.  The patient has a temperature over 102 F (39C)  F.  The patient vomited and now is having trouble breathing

## 2014-12-01 NOTE — Progress Notes (Signed)
NEUROLOGY FOLLOW UP OFFICE NOTE  Jonathan [name] [id number]  HISTORY OF PRESENT ILLNESS: I had the pleasure of seeing Jonathan [name] in follow-up in the neurology clinic on 12/01/2014.  The patient was last seen 6 months ago for recurrent episodes of confusion, concerning for seizures. He is again accompanied by his wife who helps supplement the history. He is on Lamictal 100mg  BID with no further confusional episodes. The irritability and headaches had completely resolved off Keppra. He denies any headaches, dizziness, diplopia, focal numbness/tingling/weakness. He sometimes forgets people's names. He has occasional dizziness on standing, no falls. His back pain is bothering him today. He reports that he has frequent brief spells of sensation of movement when he is lying down and flips from his left to right side, resolving when he shakes his head.   HPI: This is a pleasant 74 yo RH man with a history of hypertension, hyperlipidemia, diabetes, COPD, stroke in 2013, who presented for recurrent episodes of confusion. He went to the ER on 11/23/13 for productive cough and hypoxia, and was admitted for 3 days for COPD exacerbation. His wife reports that he appeared confused that day, he did not seem to comprehend what she was saying, with a blank stare, and could not get his shirt on. He was discharged home on home O2, and they report that he continued to have some difficulty breathing and felt diffusely weak. On 11/28/13, his wife heard a fall and found him halfway in the bathroom tub, incoherent with eyes open, unresponsive to questions. He had bladder and bowel incontinence and was apparently agitated and fighting EMS. He does not recall this, and does not recall much of his 10-day hospitalization with diagnosis of acute on chronic respiratory failure. He was discharged home, then again returned on 12/14/13 when home PT noted confusion and agitation. His wife reports that he was confused and his attitude  changes ("so mean"), he was telling PT to get out of their house. In the ER, he was evaluated by neurologist Dr. 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 CT head and MRI brain which I personally reviewed, no evidence of acute infarct. There was extensive chronic small vessel changes and small old right occipital cortical infarction. His MRA showed atherosclerotic narrowing and irregularity of both carotid siphon regions, severely diseased right A1 segment which does not definitely show flow, both anterior cerebral arteries are appear well supplied from the left carotid system. Severely diseased left vertebral artery. The right vertebral artery is dominant and widely patent to the basilar. Posterior circulation branch vessels are patent but show atherosclerotic irregularity. His routine EEG showed focal delta and theta slowing most prominent at F7, T7. He was started on Keppra 500mg  BID.   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 TEE negative. His brother had one seizure at age 59. Otherwise he had a normal birth and early development. There is no history of febrile convulsions, CNS infections such as meningitis/encephalitis, significant traumatic brain injury, neurosurgical procedures.   PAST MEDICAL HISTORY: Past Medical History  Diagnosis Date  . HTN (hypertension)   . HLD (hyperlipidemia)   . Pulmonary nodule, right     lower lobe  . COPD (chronic obstructive pulmonary disease)   . Rotator cuff injury     right  . Frozen shoulder   . Shoulder pain   . Hyperkalemia   . Dermatitis  seborrheica   . Other diseases of lung, not elsewhere classified   . Kidney stone   . Allergic rhinitis   . ED (erectile dysfunction)   . Diabetes mellitus type II   . Asthma   . Tobacco abuse   . Stroke     pt states "last year"    MEDICATIONS: Current Outpatient  Prescriptions on File Prior to Visit  Medication Sig Dispense Refill  . albuterol (PROVENTIL) (2.5 MG/3ML) 0.083% nebulizer solution Take 3 mLs (2.5 mg total) by nebulization 2 (two) times daily as needed for shortness of breath. 75 mL 0  . albuterol (VENTOLIN HFA) 108 (90 BASE) MCG/ACT inhaler Inhale 2 puffs into the lungs every 6 (six) hours as needed for wheezing. 1 Inhaler 11  . amLODipine (NORVASC) 5 MG tablet Take 1 tablet (5 mg total) by mouth daily. 90 tablet 3  . aspirin 81 MG tablet Take 1 tablet (81 mg total) by mouth daily. 30 tablet 0  . atorvastatin (LIPITOR) 10 MG tablet Take 1 tablet (10 mg total) by mouth daily. 90 tablet 3  . glipiZIDE (GLUCOTROL XL) 10 MG 24 hr tablet Take 1 tablet by mouth  daily 90 tablet 0  . insulin glargine (LANTUS) 100 unit/mL SOPN Inject 40 Units into the skin daily at 8 pm.    . insulin lispro (HUMALOG) 100 UNIT/ML KiwkPen Inject 5 Units into the skin 3 (three) times daily with meals. For cbg >=150    . lamoTRIgine (LAMICTAL) 100 MG tablet Take 1 tablet (100 mg total) by mouth 2 (two) times daily. 180 tablet 3  . metFORMIN (GLUCOPHAGE) 1000 MG tablet Take 500-1,000 mg by mouth 3 (three) times daily. Take 1 tablet in the morning Take 0.5 tablet midday Take 1 tablet in the evening    . valACYclovir (VALTREX) 1000 MG tablet Take 1 tablet (1,000 mg total) by mouth 3 (three) times daily. 21 tablet 0   No current facility-administered medications on file prior to visit.    ALLERGIES: Allergies  Allergen Reactions  . Amoxicillin-Pot Clavulanate Other (See Comments)    Severe stomach pain.   . Quinapril Hcl     REACTION: back pain  . Valsartan     REACTION: back pain  . Varenicline Tartrate     REACTION: AMS, hallucinations, night mares    FAMILY HISTORY: Family History  Problem Relation Age of Onset  . Asthma Sister   . Emphysema Sister     SOCIAL HISTORY: History   Social History  . Marital Status: Married    Spouse Name: N/A  .  Number of Children: N/A  . Years of Education: N/A   Occupational History  . Retired     Air cabin crew   Social History Main Topics  . Smoking status: Current Some Day Smoker -- 0.20 packs/day    Types: Cigarettes  . Smokeless tobacco: Never Used     Comment: prev smoked 1 1/2 PPD. Currently 2 cigs daily  . Alcohol Use: No     Comment: Quit 40 years ago  . Drug Use: No  . Sexual Activity: No   Other Topics Concern  . Not on file   Social History Narrative    REVIEW OF SYSTEMS: Constitutional: No fevers, chills, or sweats, no generalized fatigue, change in appetite Eyes: No visual changes, double vision, eye pain Ear, nose and throat: No hearing loss, ear pain, nasal congestion, sore throat Cardiovascular: No chest pain, palpitations Respiratory:  No shortness of breath at rest or  with exertion, wheezes GastrointestinaI: No nausea, vomiting, diarrhea, abdominal pain, fecal incontinence Genitourinary:  No dysuria, urinary retention or frequency Musculoskeletal:  No neck pain, +back pain Integumentary: No rash, pruritus, skin lesions Neurological: as above Psychiatric: No depression, insomnia, anxiety Endocrine: No palpitations, fatigue, diaphoresis, mood swings, change in appetite, change in weight, increased thirst Hematologic/Lymphatic:  No anemia, purpura, petechiae. Allergic/Immunologic: no itchy/runny eyes, nasal congestion, recent allergic reactions, rashes  PHYSICAL EXAM: Filed Vitals:   12/01/14 1517  BP: 128/70  Pulse: 92  Temp: 98.2 F (36.8 C)  Resp: 18   General: No acute distress Head:  Normocephalic/atraumatic Neck: supple, no paraspinal tenderness, full range of motion Heart:  Regular rate and rhythm Lungs:  Clear to auscultation bilaterally Back: No paraspinal tenderness Skin/Extremities: No rash, no edema Neurological Exam: alert and oriented to person, place, and time, no dysarthria or aphasia, Fund of knowledge is appropriate. Recent and  remote memory are intact. Attention and concentration are normal. Able to name objects and repeat phrases.  Cranial nerves:  CN I: not tested  CN II: pupils equal, round and reactive to light, visual fields intact, fundi unremarkable.  CN III, IV, VI: full range of motion, no nystagmus, no ptosis  CN V: facial sensation intact  CN VII: upper and lower face symmetric  CN VIII: hearing intact to finger rub  CN IX, X: gag intact, uvula midline  CN XI: sternocleidomastoid and trapezius muscles intact  CN XII: tongue midline  Bulk & Tone: normal, no cogwheeling, no fasciculations.  Motor: 5/5 throughout with no pronator drift.  Sensation: Intact to LT. No extinction to double simultaneous stimulation. 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 He had side effects on Keppra, now tolerating Lamictal 100mg  BID with no further episodes of confusion or aphasia. Continue current dose of Lamictal. He does not drive and understands Hansen driving to stop driving after a seizure, until 6 months seizure-free. We again discussed stroke risk factors,  intracranial stenosis, and maximum medical management with control of vascular risk factors (hypertension, diabetes, hyperlipidemia, and smoking cessation). He will follow-up in 1 year or earlier if needed, and knows to go to the ER if symptoms recur or progress.  Thank you for allowing me to participate in his care.  Please do not hesitate to call for any questions or concerns.  The duration of this appointment visit was 15 minutes of face-to-face time with the patient.  Greater than 50% of this time was spent in counseling, explanation of diagnosis, planning of further management, and coordination of care.   Ellouise Newer, M.D.   CC: Dr. Glori Bickers

## 2014-12-08 ENCOUNTER — Encounter: Payer: Self-pay | Admitting: Family Medicine

## 2014-12-08 ENCOUNTER — Ambulatory Visit (INDEPENDENT_AMBULATORY_CARE_PROVIDER_SITE_OTHER): Payer: Medicare Other | Admitting: Family Medicine

## 2014-12-08 VITALS — BP 130/78 | HR 89 | Temp 98.0°F | Wt 191.0 lb

## 2014-12-08 DIAGNOSIS — IMO0002 Reserved for concepts with insufficient information to code with codable children: Secondary | ICD-10-CM

## 2014-12-08 DIAGNOSIS — E1165 Type 2 diabetes mellitus with hyperglycemia: Secondary | ICD-10-CM

## 2014-12-08 DIAGNOSIS — D72829 Elevated white blood cell count, unspecified: Secondary | ICD-10-CM

## 2014-12-08 NOTE — Patient Instructions (Signed)
Stop up front for referral to endocrinology for diabetes  Keep thinking about quitting smoking  Your white blood cell count is stable  Follow up with me about 6 months

## 2014-12-08 NOTE — Assessment & Plan Note (Signed)
Lab Results  Component Value Date   HGBA1C 7.9* 12/01/2014   Still having difficulty controlling glucose without running am sugars too low emph imp of glucose monitoring later in the day Ref to endocrinology for help with management

## 2014-12-08 NOTE — Progress Notes (Signed)
Pre visit review using our clinic review tool, if applicable. No additional management support is needed unless otherwise documented below in the visit note. 

## 2014-12-08 NOTE — Progress Notes (Signed)
Subjective:    Patient ID: Jonathan [name], male    DOB: [birth year], 74 y.o.   MRN: [id number]  HPI Here for f/u of DM   Diabetes Home sugar results am 60s-114 , (does not get symptoms of hypoglycemia at all), higher in eve or afternoon - 140s-170s for the most part DM diet - wife fixes ad diabetic diet -watches portions - stays away from sugar and sweets  Exercise - too short of breath to exercise  Symptoms A1C last  Lab Results  Component Value Date   HGBA1C 7.9* 12/01/2014  this up from 7.5   No problems with medications  Renal protection Last eye exam 7/15  Still smoking 5-6 cig at least daily  He states he can't quit - and no longer wants to   bp is stable today  No cp or palpitations or headaches or edema  No side effects to medicines  BP Readings from Last 3 Encounters:  12/08/14 130/78  12/01/14 128/70  09/08/14 120/62       Patient Active Problem List   Diagnosis Date Noted  . Shingles 09/08/2014  . Medically noncompliant 05/05/2014  . UTI (urinary tract infection) 02/21/2014  . Leukocytosis 02/03/2014  . Seizures 12/26/2013  . Diabetes type 2, uncontrolled 12/20/2013  . Chronic respiratory failure 12/15/2013  . Aspiration pneumonia 12/15/2013  . Expressive aphasia 12/14/2013  . Syncope 11/28/2013  . Acute encephalopathy 11/28/2013  . Respiratory failure 11/28/2013  . Immunization not carried out because of patient refusal 07/05/2012  . HLD (hyperlipidemia)   . Pulmonary nodule, right   . Allergic rhinitis   . ED (erectile dysfunction)   . Asthma   . Tobacco abuse   . COPD 03/29/2010  . Essential hypertension 01/05/2007   Past Medical History  Diagnosis Date  . HTN (hypertension)   . HLD (hyperlipidemia)   . Pulmonary nodule, right     lower lobe  . COPD (chronic obstructive pulmonary disease)   . Rotator cuff injury     right  . Frozen shoulder   . Shoulder pain   . Hyperkalemia   . Dermatitis seborrheica   . Other diseases of lung,  not elsewhere classified   . Kidney stone   . Allergic rhinitis   . ED (erectile dysfunction)   . Diabetes mellitus type II   . Asthma   . Tobacco abuse   . Stroke     pt states "last year"   Past Surgical History  Procedure Laterality Date  . Cervical discectomy  1998  . Ett  11/1994  . Tm repair    . Tee without cardioversion  12/03/2011    Procedure: TRANSESOPHAGEAL ECHOCARDIOGRAM (TEE);  Surgeon: Lelon Perla, MD;  Location: [facility] ENDOSCOPY;  Service: Cardiovascular;  Laterality: N/A;  . Eeg     History  Substance Use Topics  . Smoking status: Current Some Day Smoker -- 0.20 packs/day    Types: Cigarettes  . Smokeless tobacco: Never Used     Comment: prev smoked 1 1/2 PPD. Currently 2 cigs daily  . Alcohol Use: No     Comment: Quit 40 years ago   Family History  Problem Relation Age of Onset  . Asthma Sister   . Emphysema Sister    Allergies  Allergen Reactions  . Amoxicillin-Pot Clavulanate Other (See Comments)    Severe stomach pain.   . Quinapril Hcl     REACTION: back pain  . Valsartan     REACTION: back pain  .  Varenicline Tartrate     REACTION: AMS, hallucinations, night mares   Current Outpatient Prescriptions on File Prior to Visit  Medication Sig Dispense Refill  . albuterol (PROVENTIL) (2.5 MG/3ML) 0.083% nebulizer solution Take 3 mLs (2.5 mg total) by nebulization 2 (two) times daily as needed for shortness of breath. 75 mL 0  . albuterol (VENTOLIN HFA) 108 (90 BASE) MCG/ACT inhaler Inhale 2 puffs into the lungs every 6 (six) hours as needed for wheezing. 1 Inhaler 11  . amLODipine (NORVASC) 5 MG tablet Take 1 tablet (5 mg total) by mouth daily. 90 tablet 3  . aspirin 81 MG tablet Take 1 tablet (81 mg total) by mouth daily. 30 tablet 0  . atorvastatin (LIPITOR) 10 MG tablet Take 1 tablet (10 mg total) by mouth daily. 90 tablet 3  . glipiZIDE (GLUCOTROL XL) 10 MG 24 hr tablet Take 1 tablet by mouth  daily 90 tablet 0  . insulin glargine (LANTUS) 100  unit/mL SOPN Inject 40 Units into the skin daily at 8 pm.    . insulin lispro (HUMALOG) 100 UNIT/ML KiwkPen Inject 5 Units into the skin 3 (three) times daily with meals. For cbg >=150    . lamoTRIgine (LAMICTAL) 100 MG tablet Take 1 tablet (100 mg total) by mouth 2 (two) times daily. 180 tablet 3  . metFORMIN (GLUCOPHAGE) 1000 MG tablet Take 500-1,000 mg by mouth 3 (three) times daily. Take 1 tablet in the morning Take 0.5 tablet midday Take 1 tablet in the evening     No current facility-administered medications on file prior to visit.      Review of Systems Review of Systems  Constitutional: Negative for fever, appetite change, fatigue and unexpected weight change.  Eyes: Negative for pain and visual disturbance.  Respiratory: Negative for cough and pos for baseline  shortness of breath.   Cardiovascular: Negative for cp or palpitations    Gastrointestinal: Negative for nausea, diarrhea and constipation.  Genitourinary: Negative for urgency and frequency. neg for excessive thirst  Skin: Negative for pallor or rash   Neurological: Negative for weakness, light-headedness, numbness and headaches.  Hematological: Negative for adenopathy. Does not bruise/bleed easily.  Psychiatric/Behavioral: Negative for dysphoric mood. The patient is not nervous/anxious.         Objective:   Physical Exam  Constitutional: He appears well-developed and well-nourished. No distress.  obese and well appearing   HENT:  Head: Normocephalic and atraumatic.  Mouth/Throat: Oropharynx is clear and moist.  Eyes: Conjunctivae and EOM are normal. Pupils are equal, round, and reactive to light. No scleral icterus.  Neck: Normal range of motion. Neck supple. No JVD present. Carotid bruit is not present. No thyromegaly present.  Cardiovascular: Normal rate, regular rhythm and normal heart sounds.   No murmur heard. Pulmonary/Chest: Effort normal. No respiratory distress. He has wheezes. He has no rales.    Diffusely distant bs Fair air exch  Mildly prolonged exp phase   Abdominal: Soft. Bowel sounds are normal. He exhibits no distension and no mass. There is no tenderness.  Musculoskeletal: He exhibits no edema.  Lymphadenopathy:    He has no cervical adenopathy.  Neurological: He is alert. He has normal reflexes. No cranial nerve deficit. He exhibits normal muscle tone. Coordination normal.  Skin: Skin is warm and dry. No rash noted. No erythema. No pallor.  Psychiatric: He has a normal mood and affect.          Assessment & Plan:   Problem List Items Addressed This  Visit      Other   Diabetes type 2, uncontrolled - Primary    Lab Results  Component Value Date   HGBA1C 7.9* 12/01/2014   Still having difficulty controlling glucose without running am sugars too low emph imp of glucose monitoring later in the day Ref to endocrinology for help with management       Relevant Orders   Ambulatory referral to Endocrinology   Leukocytosis    Lab Results  Component Value Date   WBC 13.6* 12/01/2014   Overall stable No symptoms  Will continue to watch

## 2014-12-10 NOTE — Assessment & Plan Note (Signed)
Lab Results  Component Value Date   WBC 13.6* 12/01/2014   Overall stable No symptoms  Will continue to watch

## 2014-12-12 ENCOUNTER — Ambulatory Visit (INDEPENDENT_AMBULATORY_CARE_PROVIDER_SITE_OTHER): Payer: Medicare Other | Admitting: Endocrinology

## 2014-12-12 ENCOUNTER — Encounter: Payer: Self-pay | Admitting: Endocrinology

## 2014-12-12 VITALS — BP 136/72 | HR 88 | Resp 20 | Ht 67.0 in | Wt 190.5 lb

## 2014-12-12 DIAGNOSIS — I10 Essential (primary) hypertension: Secondary | ICD-10-CM

## 2014-12-12 DIAGNOSIS — E1165 Type 2 diabetes mellitus with hyperglycemia: Secondary | ICD-10-CM | POA: Diagnosis not present

## 2014-12-12 DIAGNOSIS — E785 Hyperlipidemia, unspecified: Secondary | ICD-10-CM | POA: Diagnosis not present

## 2014-12-12 DIAGNOSIS — IMO0002 Reserved for concepts with insufficient information to code with codable children: Secondary | ICD-10-CM

## 2014-12-12 LAB — COMPREHENSIVE METABOLIC PANEL
ALT: 16 U/L (ref 0–53)
AST: 20 U/L (ref 0–37)
Albumin: 4.8 g/dL (ref 3.5–5.2)
Alkaline Phosphatase: 68 U/L (ref 39–117)
BUN: 12 mg/dL (ref 6–23)
CO2: 33 mEq/L — ABNORMAL HIGH (ref 19–32)
Calcium: 10 mg/dL (ref 8.4–10.5)
Chloride: 97 mEq/L (ref 96–112)
Creatinine, Ser: 0.99 mg/dL (ref 0.40–1.50)
GFR: 78.44 mL/min (ref >60.00)
Glucose, Bld: 90 mg/dL (ref 70–99)
Potassium: 4.5 mEq/L (ref 3.5–5.1)
Sodium: 137 mEq/L (ref 135–145)
Total Bilirubin: 1 mg/dL (ref 0.2–1.2)
Total Protein: 7.8 g/dL (ref 6.0–8.3)

## 2014-12-12 LAB — HM DIABETES FOOT EXAM: HM Diabetic Foot Exam: NORMAL

## 2014-12-12 NOTE — Assessment & Plan Note (Signed)
Recent A1c trending up. Discussed that goal A1c should be closer to 7%.  Discussed dietary measures and he was asked to eat small meals atleast three times daily.  Discussed FS monitoring, eye exams, foot care and hypoglycemia treatment and recognition.  Discussed role of his current DM medications. At the next few visits, would like to wean off SU and try him on basal/bolus regimen. Need more sugar data to make any changes. Asked him to check 3 x daily.  At next visit, in 2 weeks will make further medication adjustments.   Given lower early am readings and hypoglycemia unawareness, decrease lantus to 43 units daily.

## 2014-12-12 NOTE — Patient Instructions (Signed)
Check sugars 3 times daily ( fasting and premeal readings at alternating times, or at bedtime).  Record them in a sugar log and bring log and meter to next appointment.   Check BF, supper, bedtime readings. Labs today.  Cut back Lantus to 43 units daily at bedtime.  Next visit, based on your sugars, will likely cut back Glipizide and introduce fixed dose Humalog at meal times.   Start having small lunch.   Please come back for a follow-up appointment in 2 weeks

## 2014-12-12 NOTE — Progress Notes (Signed)
Pre visit review using our clinic review tool, if applicable. No additional management support is needed unless otherwise documented below in the visit note. 

## 2014-12-12 NOTE — Assessment & Plan Note (Signed)
Recent LDL at goal on current therapy.

## 2014-12-12 NOTE — Assessment & Plan Note (Signed)
BP at target today. Urine MA normal 03/2014.

## 2014-12-12 NOTE — Progress Notes (Signed)
Reason for visit-  Jonathan [name] is a 74 y.o.-year-old male, referred by his PCP,  Tower, Wynelle Fanny, MD for management of Type 2 diabetes, uncontrolled, without complications. 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 Here with caretaker   HPI- Patient has been diagnosed with diabetes in 42s. Recalls being initially on lifestyle modifications.  he has been on insulin since ~2013.   * Around early 2015, he was hospitalized after syncope, was in ICU, was on assisted ventilation, expressive aphasia recovered- believed to have a stroke, now better. Was probably given some steroids then. No recent steroids.    Pt is currently on a regimen of: -Metformin 1000 mg am, 500 mg afternoon and 1000 mg pm -Glipizide XL 10 mg daily - Lantus 45 units qhs ( changed 1 month ago from 40 units) - Humalog 5 units qac TID, if FS>150 ( hasn't used it recently, in >3 months)   Last hemoglobin A1c was: Lab Results  Component Value Date   HGBA1C 7.9* 12/01/2014   HGBA1C 7.5* 09/01/2014   HGBA1C 8.2* 04/28/2014     Pt checks his sugars 1 a day . Uses ? Glucometer. Has new AccuChek meter that he is going to start using. By recall they are:  PREMEAL Breakfast Lunch Dinner Bedtime Overall  Glucose range: 63, 92 135- n/a n/a   Mean/median:        POST-MEAL PC Breakfast PC Lunch PC Dinner  Glucose range:     Mean/median:       Hypoglycemia-  Some recent lows lows. Lowest sugar was 63; he does not have hypoglycemia awareness at 70.   Dietary habits- eats three times daily. Limits carbs, sweetened beverages, sodas, desserts. Gets full easily. Misses lunch consistently- snacks around 3pm. Some days, he eats a lot and some days he cant eat.  Exercise- cant do exercise, due to exercise, sob Weight -  Wt Readings from Last 3 Encounters:  12/12/14 190 lb 8 oz (86.41 kg)  12/08/14 191 lb (86.637 kg)  12/01/14 192 lb 6.4 oz (87.272 kg)    Diabetes  Complications-  Nephropathy- No, ? Developing Stage2  CKD, last BUN/creatinine- GFR 68-83 Lab Results  Component Value Date   BUN 13 04/28/2014   CREATININE 1.1 04/28/2014   Lab Results  Component Value Date   GFR 68.15 04/28/2014   MICRALBCREAT 12.7 04/28/2014    Retinopathy- No, Last DEE was in 2016 Neuropathy- no numbness and tingling in )his feet. No known neuropathy.  Associated history - No CAD . No hypothyroidism. his last TSH was  Lab Results  Component Value Date   TSH 0.447 11/28/2013    Hyperlipidemia-  his last set of lipids were- Currently on Lipitor 10. Tolerating well.   Lab Results  Component Value Date   CHOL 132 09/01/2014   HDL 45.40 09/01/2014   LDLCALC 58 09/01/2014   LDLDIRECT 75.4 09/30/2012   TRIG 141.0 09/01/2014   CHOLHDL 3 09/01/2014    Blood Pressure/HTN- Patient's blood pressure is well controlled today on current regimen. Allergic to ACE-I and ARB  Pt has FH of DM in mother, brothers . Smokes 1/2 PPD for several years.  I have reviewed the patient's past medical history, family and social history, surgical history, medications and allergies.  Past Medical History  Diagnosis Date  . HTN (hypertension)   . HLD (hyperlipidemia)   . Pulmonary nodule, right     lower lobe  . COPD (chronic obstructive pulmonary  disease)   . Rotator cuff injury     right  . Frozen shoulder   . Shoulder pain   . Hyperkalemia   . Dermatitis seborrheica   . Other diseases of lung, not elsewhere classified   . Kidney stone   . Allergic rhinitis   . ED (erectile dysfunction)   . Diabetes mellitus type II   . Asthma   . Tobacco abuse   . Stroke     pt states "last year"   Past Surgical History  Procedure Laterality Date  . Cervical discectomy  1998  . Ett  11/1994  . Tm repair    . Tee without cardioversion  12/03/2011    Procedure: TRANSESOPHAGEAL ECHOCARDIOGRAM (TEE);  Surgeon: Lelon Perla, MD;  Location: [facility] ENDOSCOPY;  Service: Cardiovascular;   Laterality: N/A;  . Eeg     Family History  Problem Relation Age of Onset  . Asthma Sister   . Emphysema Sister   . Diabetes Mother   . Diabetes Brother   . Heart disease Brother    History   Social History  . Marital Status: Married    Spouse Name: N/A  . Number of Children: N/A  . Years of Education: N/A   Occupational History  . Retired     Air cabin crew   Social History Main Topics  . Smoking status: Current Some Day Smoker -- 0.20 packs/day    Types: Cigarettes  . Smokeless tobacco: Never Used     Comment: prev smoked 1 1/2 PPD. Currently 2 cigs daily  . Alcohol Use: No     Comment: Quit 40 years ago  . Drug Use: No  . Sexual Activity: No   Other Topics Concern  . Not on file   Social History Narrative   Current Outpatient Prescriptions on File Prior to Visit  Medication Sig Dispense Refill  . albuterol (PROVENTIL) (2.5 MG/3ML) 0.083% nebulizer solution Take 3 mLs (2.5 mg total) by nebulization 2 (two) times daily as needed for shortness of breath. 75 mL 0  . albuterol (VENTOLIN HFA) 108 (90 BASE) MCG/ACT inhaler Inhale 2 puffs into the lungs every 6 (six) hours as needed for wheezing. 1 Inhaler 11  . amLODipine (NORVASC) 5 MG tablet Take 1 tablet (5 mg total) by mouth daily. 90 tablet 3  . aspirin 81 MG tablet Take 1 tablet (81 mg total) by mouth daily. 30 tablet 0  . atorvastatin (LIPITOR) 10 MG tablet Take 1 tablet (10 mg total) by mouth daily. 90 tablet 3  . glipiZIDE (GLUCOTROL XL) 10 MG 24 hr tablet Take 1 tablet by mouth  daily 90 tablet 0  . insulin glargine (LANTUS) 100 unit/mL SOPN Inject 40 Units into the skin daily at 8 pm.    . insulin lispro (HUMALOG) 100 UNIT/ML KiwkPen Inject 5 Units into the skin 3 (three) times daily with meals. For cbg >=150    . lamoTRIgine (LAMICTAL) 100 MG tablet Take 1 tablet (100 mg total) by mouth 2 (two) times daily. 180 tablet 3  . metFORMIN (GLUCOPHAGE) 1000 MG tablet Take 500-1,000 mg by mouth 3 (three) times daily.  Take 1 tablet in the morning Take 0.5 tablet midday Take 1 tablet in the evening     No current facility-administered medications on file prior to visit.   Allergies  Allergen Reactions  . Amoxicillin-Pot Clavulanate Other (See Comments)    Severe stomach pain.   . Quinapril Hcl     REACTION: back pain  .  Valsartan     REACTION: back pain  . Varenicline Tartrate     REACTION: AMS, hallucinations, night mares     Review of Systems: [x]  complains of  [  ] denies General:   [  ] Recent weight change [  ] Fatigue  [  ] Loss of appetite Eyes: [  ]  Vision Difficulty [  ]  Eye pain ENT: [  ]  Hearing difficulty [  ]  Difficulty Swallowing CVS: [  ] Chest pain [  ]  Palpitations/Irregular Heart beat [  ]  Shortness of breath lying flat [  ] Swelling of legs Resp: [  ] Frequent Cough [x  ] Shortness of Breath  [ x ]  Wheezing GI: [  ] Heartburn  [  ] Nausea or Vomiting  [  ] Diarrhea [  ] Constipation  [  ] Abdominal Pain GU: [  ]  Polyuria  [  ]  nocturia Bones/joints:  [  ]  Muscle aches  [  ] Joint Pain  [ x ] Bone pain Skin/Hair/Nails: [  ]  Rash  [  ] New stretch marks [  ]  Itching [  ] Hair loss [  ]  Excessive hair growth Reproduction: [x  ] Low sexual desire , [  ]  Women: Menstrual cycle problems [  ]  Women: Breast Discharge [x  ] Men: Difficulty with erections [  ]  Men: Enlarged Breasts CNS: [  ] Frequent Headaches [  ] Blurry vision [  ] Tremors [  ] Seizures [  ] Loss of consciousness [  ] Localized weakness Endocrine: [  ]  Excess thirst [  ]  Feeling excessively hot [  ]  Feeling excessively cold Heme: [ x ]  Easy bruising [  ]  Enlarged glands or lumps in neck Allergy: [  ]  Food allergies [  ] Environmental allergies  PE: BP 136/72 mmHg  Pulse 88  Resp 20  Ht 5\' 7"  (1.702 m)  Wt 190 lb 8 oz (86.41 kg)  BMI 29.83 kg/m2  SpO2 94% Wt Readings from Last 3 Encounters:  12/12/14 190 lb 8 oz (86.41 kg)  12/08/14 191 lb (86.637 kg)  12/01/14 192 lb 6.4 oz (87.272 kg)    GENERAL: No acute distress, well developed HEENT:  Eye exam shows normal external appearance. Oral exam shows normal mucosa .  NECK:   Neck exam shows no lymphadenopathy. No Carotids bruits. Thyroid is not enlarged and no nodules felt.  no acanthosis nigricans LUNGS:         Chest is symmetrical. Lungs are clear to auscultation.[name]   HEART:         Heart sounds:  S1 and S2 are normal. No murmurs or clicks heard. ABDOMEN:  No Distention present. Liver and spleen are not palpable. No other mass or tenderness present.  EXTREMITIES:     There is no edema. 2+ DP pulses  NEUROLOGICAL:     Grossly intact.            Diabetic foot exam done with shoes and socks removed: Normal Monofilament testing bilaterally. No deformities of toes.  Nails   dystrophic. Skin normal color. No open wounds. Dry skin with callosities.  MUSCULOSKELETAL:       There is no enlargement or gross deformity of the joints.  SKIN:       No rash  ASSESSMENT AND PLAN: Problem List Items Addressed This  Visit      Cardiovascular and Mediastinum   Essential hypertension    BP at target today. Urine MA normal 03/2014.        Other   HLD (hyperlipidemia)    Recent LDL at goal on current therapy.       Diabetes type 2, uncontrolled - Primary    Recent A1c trending up. Discussed that goal A1c should be closer to 7%.  Discussed dietary measures and he was asked to eat small meals atleast three times daily.  Discussed FS monitoring, eye exams, foot care and hypoglycemia treatment and recognition.  Discussed role of his current DM medications. At the next few visits, would like to wean off SU and try him on basal/bolus regimen. Need more sugar data to make any changes. Asked him to check 3 x daily.  At next visit, in 2 weeks will make further medication adjustments.   Given lower early am readings and hypoglycemia unawareness, decrease lantus to 43 units daily.         Relevant Orders   Comprehensive metabolic panel        - Return to clinic in 2 weeks with sugar log/meter.  Hoover Grewe [facility] 12/12/2014 1:18 PM

## 2014-12-13 ENCOUNTER — Other Ambulatory Visit: Payer: Self-pay | Admitting: Family Medicine

## 2014-12-27 ENCOUNTER — Ambulatory Visit: Payer: Self-pay | Admitting: Neurology

## 2015-01-03 ENCOUNTER — Ambulatory Visit (INDEPENDENT_AMBULATORY_CARE_PROVIDER_SITE_OTHER): Payer: Medicare Other | Admitting: Endocrinology

## 2015-01-03 ENCOUNTER — Encounter: Payer: Self-pay | Admitting: Endocrinology

## 2015-01-03 VITALS — BP 122/78 | HR 90 | Resp 14 | Ht 67.0 in | Wt 190.8 lb

## 2015-01-03 DIAGNOSIS — IMO0002 Reserved for concepts with insufficient information to code with codable children: Secondary | ICD-10-CM

## 2015-01-03 DIAGNOSIS — E1165 Type 2 diabetes mellitus with hyperglycemia: Secondary | ICD-10-CM | POA: Diagnosis not present

## 2015-01-03 DIAGNOSIS — I10 Essential (primary) hypertension: Secondary | ICD-10-CM | POA: Diagnosis not present

## 2015-01-03 NOTE — Patient Instructions (Signed)
Check sugars 4 x daily ( before each meal and at bedtime).  Record them in a log book and bring that/meter to next appointment.  accuchek aviva meter teaching given.  Asked to work on consistency with food intake, avoiding snacking in between meals, consistency at times of meals.   Continue current medications for diabetes.   Please come back for a follow-up appointment in 1 month.

## 2015-01-03 NOTE — Assessment & Plan Note (Signed)
Recent A1c trending up. Discussed that goal A1c should be closer to 7%.  Discussed dietary measures and he was asked to eat small meals atleast three times daily. Asked to avoid snacking in between meals, and eat at consistent times of the day.  Discussed that his sugars were probably low last week from the illness, but this week - they are better.  Continue current medications for diabetes.  Check premeal sugars and at bedtime.   At the next few visits, would like to wean off SU and try him on basal/bolus regimen.  He is hesitent to take his Humalog for supper time. Asked him to check prior to that meal and base it on his sugars. His night time readings are higher likely taking it within 2 hours of eating.

## 2015-01-03 NOTE — Progress Notes (Signed)
Reason for visit-  Jonathan [name] is a 74 y.o.-year-old male, here for follow up management of Type 2 diabetes, uncontrolled, without complications. 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 Here with caretaker. Last seen 2 weeks ago.   HPI- Patient has been diagnosed with diabetes in 1990s. Recalls being initially on lifestyle modifications.  he has been on insulin since ~2013.   * Around early 2015, he was hospitalized after syncope, was in ICU, was on assisted ventilation, expressive aphasia recovered- believed to have a stroke, now better. Was probably given some steroids then. No recent steroids.   * had a "bug" week prior to appt and sugars were running lower, but now are creeping back to where they were   Pt is currently on a regimen of: -Metformin 1000 mg am, 500 mg afternoon and 1000 mg pm -Glipizide XL 10 mg daily - Lantus 43 units qhs (decreased from last visit due to lower morning readings) - Humalog 5 units qac TID, if FS>150 ( hasn't used it recently, in >3 months)   Last hemoglobin A1c was: Lab Results  Component Value Date   HGBA1C 7.9* 12/01/2014   HGBA1C 7.5* 09/01/2014   HGBA1C 8.2* 04/28/2014     Pt checks his sugars 3 a day . Uses ? Glucometer. Has new AccuChek meter that he is going to start using- reports that he needs log book they are:  PREMEAL Breakfast Lunch Dinner Bedtime Overall  Glucose range: 75-137   141-266   Mean/median:        POST-MEAL PC Breakfast PC Lunch PC Dinner  Glucose range:  72-183   Mean/median:      *variable eating times, snacks in between meals, Bedtime readings are sometimes within the hour-1.5 hours of eating a bedtime snack.   Hypoglycemia-  Some recent lows. Lowest sugar was 72; he does not have hypoglycemia awareness at 70.   Dietary habits- eats three times daily. Limits carbs, sweetened beverages, sodas, desserts. Gets full easily. Used to miss lunch consistently-  snacks around 3pm. Some days, he eats a lot and some days he cant eat. Now trying to eat lunch little more consistently, trying to increase veggie intake Exercise- cant do exercise, due to exercise, sob Weight -  Wt Readings from Last 3 Encounters:  01/03/15 190 lb 12 oz (86.524 kg)  12/12/14 190 lb 8 oz (86.41 kg)  12/08/14 191 lb (86.637 kg)    Diabetes Complications-  Nephropathy- No, ? Developing Stage2  CKD, last BUN/creatinine- GFR 68-83 Lab Results  Component Value Date   BUN 12 12/12/2014   CREATININE 0.99 12/12/2014   Lab Results  Component Value Date   GFR 78.44 12/12/2014   MICRALBCREAT 12.7 04/28/2014    Retinopathy- No, Last DEE was in 2016 Neuropathy- no numbness and tingling in )his feet. No known neuropathy.  Associated history - No CAD . No hypothyroidism. his last TSH was  Lab Results  Component Value Date   TSH 0.447 11/28/2013    Hyperlipidemia-  his last set of lipids were- Currently on Lipitor 10. Tolerating well.   Lab Results  Component Value Date   CHOL 132 09/01/2014   HDL 45.40 09/01/2014   LDLCALC 58 09/01/2014   LDLDIRECT 75.4 09/30/2012   TRIG 141.0 09/01/2014   CHOLHDL 3 09/01/2014    Blood Pressure/HTN- Patient's blood pressure is well controlled today on current regimen. 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  I have reviewed the patient's past medical history, medications and allergies.   Current Outpatient Prescriptions on File Prior to Visit  Medication Sig Dispense Refill  . albuterol (PROVENTIL) (2.5 MG/3ML) 0.083% nebulizer solution Take 3 mLs (2.5 mg total) by nebulization 2 (two) times daily as needed for shortness of breath. 75 mL 0  . albuterol (VENTOLIN HFA) 108 (90 BASE) MCG/ACT inhaler Inhale 2 puffs into the lungs every 6 (six) hours as needed for wheezing. 1 Inhaler 11  . amLODipine (NORVASC) 5 MG tablet Take 1 tablet (5 mg total) by mouth daily. 90 tablet 3  . aspirin 81 MG tablet Take 1 tablet  (81 mg total) by mouth daily. 30 tablet 0  . atorvastatin (LIPITOR) 10 MG tablet Take 1 tablet (10 mg total) by mouth daily. 90 tablet 3  . Blood Glucose Monitoring Suppl (ACCU-CHEK AVIVA PLUS) W/DEVICE KIT USE AS DIRECTED 1 kit 0  . glipiZIDE (GLUCOTROL XL) 10 MG 24 hr tablet Take 1 tablet by mouth  daily 90 tablet 0  . insulin glargine (LANTUS) 100 unit/mL SOPN Inject 43 Units into the skin daily at 8 pm.     . insulin lispro (HUMALOG) 100 UNIT/ML KiwkPen Inject 5 Units into the skin 3 (three) times daily with meals. For cbg >=150    . lamoTRIgine (LAMICTAL) 100 MG tablet Take 1 tablet (100 mg total) by mouth 2 (two) times daily. 180 tablet 3  . metFORMIN (GLUCOPHAGE) 1000 MG tablet Take 500-1,000 mg by mouth 3 (three) times daily. Take 1 tablet in the morning Take 0.5 tablet midday Take 1 tablet in the evening     No current facility-administered medications on file prior to visit.   Allergies  Allergen Reactions  . Amoxicillin-Pot Clavulanate Other (See Comments)    Severe stomach pain.   . Quinapril Hcl     REACTION: back pain  . Valsartan     REACTION: back pain  . Varenicline Tartrate     REACTION: AMS, hallucinations, night mares   Review of Systems- [ x ]  Complains of    [  ]  denies [  ] Recent weight change [  ]  Fatigue [  ] polydipsia [  ] polyuria [  ]  nocturia [  ]  vision difficulty [  ] chest pain [  x] stable shortness of breath [  ] leg swelling [ x ] cough [  ] nausea/vomiting [  ] diarrhea [  ] constipation [  ] abdominal pain [  ]  tingling/numbness in extremities [  ]  concern with feet ( wounds/sores)   PE: BP 122/78 mmHg  Pulse 90  Resp 14  Ht 5' 7"  (1.702 m)  Wt 190 lb 12 oz (86.524 kg)  BMI 29.87 kg/m2  SpO2 95% Wt Readings from Last 3 Encounters:  01/03/15 190 lb 12 oz (86.524 kg)  12/12/14 190 lb 8 oz (86.41 kg)  12/08/14 191 lb (86.637 kg)   Exam: deferred  ASSESSMENT AND PLAN: Problem List Items Addressed This Visit      Cardiovascular  and Mediastinum   Essential hypertension    BP at target today. Urine MA normal 03/2014.          Other   Diabetes type 2, uncontrolled - Primary    Recent A1c trending up. Discussed that goal A1c should be closer to 7%.  Discussed dietary measures and he was asked to eat small meals atleast three times daily. Asked to avoid snacking  in between meals, and eat at consistent times of the day.  Discussed that his sugars were probably low last week from the illness, but this week - they are better.  Continue current medications for diabetes.  Check premeal sugars and at bedtime.   At the next few visits, would like to wean off SU and try him on basal/bolus regimen.  He is hesitent to take his Humalog for supper time. Asked him to check prior to that meal and base it on his sugars. His night time readings are higher likely taking it within 2 hours of eating.                  - Return to clinic in 4 weeks with sugar log/meter.  Ariell Gunnels [facility] 01/03/2015 12:11 PM

## 2015-01-03 NOTE — Progress Notes (Signed)
Pre visit review using our clinic review tool, if applicable. No additional management support is needed unless otherwise documented below in the visit note. 

## 2015-01-03 NOTE — Assessment & Plan Note (Signed)
BP at target today. Urine MA normal 03/2014.

## 2015-02-06 ENCOUNTER — Ambulatory Visit (INDEPENDENT_AMBULATORY_CARE_PROVIDER_SITE_OTHER): Payer: Medicare Other | Admitting: Endocrinology

## 2015-02-06 ENCOUNTER — Encounter: Payer: Self-pay | Admitting: Endocrinology

## 2015-02-06 VITALS — BP 128/76 | HR 99 | Resp 18 | Ht 67.0 in | Wt 186.5 lb

## 2015-02-06 DIAGNOSIS — I10 Essential (primary) hypertension: Secondary | ICD-10-CM

## 2015-02-06 DIAGNOSIS — E1165 Type 2 diabetes mellitus with hyperglycemia: Secondary | ICD-10-CM | POA: Diagnosis not present

## 2015-02-06 DIAGNOSIS — IMO0002 Reserved for concepts with insufficient information to code with codable children: Secondary | ICD-10-CM

## 2015-02-06 NOTE — Progress Notes (Signed)
Pre visit review using our clinic review tool, if applicable. No additional management support is needed unless otherwise documented below in the visit note. 

## 2015-02-06 NOTE — Progress Notes (Signed)
Patient ID: ASAF ELMQUIST, male   DOB: Feb 22, 1941, 74 y.o.   MRN: [id number]   Reason for visit-  CHRISTIE COPLEY is a 74 y.o.-year-old male, here for follow up management of Type 2 diabetes, uncontrolled, without complications. 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 Not here with caretaker today as she had to go on family emergency. Last seen April 2016.   HPI- Patient has been diagnosed with diabetes in 1990s. Recalls being initially on lifestyle modifications.  he has been on insulin since ~2013.   * Around early 2015, he was hospitalized after syncope, was in ICU, was on assisted ventilation, expressive aphasia recovered- believed to have a stroke, now better. Was probably given some steroids then. No recent steroids.     Pt is currently on a regimen of: -Metformin 1000 mg am, 500 mg afternoon and 1000 mg pm -Glipizide XL 10 mg daily - Lantus 43 units qhs (decreased from last visit due to lower morning readings) - Humalog 5 units qac TID, if FS>150 ( hasn't used it recently, in >3 months)   Last hemoglobin A1c was: Lab Results  Component Value Date   HGBA1C 7.9* 12/01/2014   HGBA1C 7.5* 09/01/2014   HGBA1C 8.2* 04/28/2014     Pt checks his sugars 3 a day . Uses AccuChek Glucometer. By log book they are:  PREMEAL Breakfast Lunch Dinner Bedtime Overall  Glucose range: 69-116 103-226  104-200s   Mean/median:        POST-MEAL PC Breakfast PC Lunch PC Dinner  Glucose range:     Mean/median:      *variable eating times, snacks in between meals, Bedtime readings are sometimes within the hour-1.5 hours of eating a bedtime snack.   Hypoglycemia-  Some recent lows. Lowest sugar was 69; he does not have hypoglycemia awareness at 70.   Dietary habits- eats three times daily. Limits carbs, sweetened beverages, sodas, desserts. Gets full easily. Used to miss lunch consistently- snacks around 3pm. Some days, he eats a lot and some  days he cant eat. Now trying to eat lunch little more consistently, trying to increase veggie intake>>done better with this since past visit Exercise- cant do exercise, due to exercise, sob Weight -  Wt Readings from Last 3 Encounters:  02/06/15 186 lb 8 oz (84.596 kg)  01/03/15 190 lb 12 oz (86.524 kg)  12/12/14 190 lb 8 oz (86.41 kg)    Diabetes Complications-  Nephropathy- No, ? Developing Stage2  CKD, last BUN/creatinine- GFR 68-83 Lab Results  Component Value Date   BUN 12 12/12/2014   CREATININE 0.99 12/12/2014   Lab Results  Component Value Date   GFR 78.44 12/12/2014   MICRALBCREAT 12.7 04/28/2014    Retinopathy- No, Last DEE was in 2016 Neuropathy- no numbness and tingling in )his feet. No known neuropathy.  Associated history - No CAD . No hypothyroidism. his last TSH was  Lab Results  Component Value Date   TSH 0.447 11/28/2013    Hyperlipidemia-  his last set of lipids were- Currently on Lipitor 10. Tolerating well.   Lab Results  Component Value Date   CHOL 132 09/01/2014   HDL 45.40 09/01/2014   LDLCALC 58 09/01/2014   LDLDIRECT 75.4 09/30/2012   TRIG 141.0 09/01/2014   CHOLHDL 3 09/01/2014    Blood Pressure/HTN- Patient's blood pressure is well controlled today on current regimen. 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  I  have reviewed the patient's past medical history, medications and allergies.   Current Outpatient Prescriptions on File Prior to Visit  Medication Sig Dispense Refill  . ACCU-CHEK AVIVA PLUS test strip     . albuterol (PROVENTIL) (2.5 MG/3ML) 0.083% nebulizer solution Take 3 mLs (2.5 mg total) by nebulization 2 (two) times daily as needed for shortness of breath. 75 mL 0  . albuterol (VENTOLIN HFA) 108 (90 BASE) MCG/ACT inhaler Inhale 2 puffs into the lungs every 6 (six) hours as needed for wheezing. 1 Inhaler 11  . amLODipine (NORVASC) 5 MG tablet Take 1 tablet (5 mg total) by mouth daily. 90 tablet 3  .  aspirin 81 MG tablet Take 1 tablet (81 mg total) by mouth daily. 30 tablet 0  . atorvastatin (LIPITOR) 10 MG tablet Take 1 tablet (10 mg total) by mouth daily. 90 tablet 3  . Blood Glucose Monitoring Suppl (ACCU-CHEK AVIVA PLUS) W/DEVICE KIT USE AS DIRECTED 1 kit 0  . glipiZIDE (GLUCOTROL XL) 10 MG 24 hr tablet Take 1 tablet by mouth  daily 90 tablet 0  . insulin glargine (LANTUS) 100 unit/mL SOPN Inject 43 Units into the skin daily at 8 pm.     . insulin lispro (HUMALOG) 100 UNIT/ML KiwkPen Inject 5 Units into the skin 3 (three) times daily with meals. For cbg >=150    . lamoTRIgine (LAMICTAL) 100 MG tablet Take 1 tablet (100 mg total) by mouth 2 (two) times daily. 180 tablet 3  . metFORMIN (GLUCOPHAGE) 1000 MG tablet Take 500-1,000 mg by mouth 3 (three) times daily. Take 1 tablet in the morning Take 0.5 tablet midday Take 1 tablet in the evening     No current facility-administered medications on file prior to visit.   Allergies  Allergen Reactions  . Amoxicillin-Pot Clavulanate Other (See Comments)    Severe stomach pain.   . Quinapril Hcl     REACTION: back pain  . Valsartan     REACTION: back pain  . Varenicline Tartrate     REACTION: AMS, hallucinations, night mares   Review of Systems- [ x ]  Complains of    [  ]  denies [  ] Recent weight change [  ]  Fatigue [  ] polydipsia [  ] polyuria [  ]  nocturia [  ]  vision difficulty [  ] chest pain [  x] stable shortness of breath [  ] leg swelling [ x ] cough [  ] nausea/vomiting [  ] diarrhea [  ] constipation [  ] abdominal pain [  ]  tingling/numbness in extremities [  ]  concern with feet ( wounds/sores)   PE: BP 128/76 mmHg  Pulse 99  Resp 18  Ht 5' 7"  (1.702 m)  Wt 186 lb 8 oz (84.596 kg)  BMI 29.20 kg/m2  SpO2 95% Wt Readings from Last 3 Encounters:  02/06/15 186 lb 8 oz (84.596 kg)  01/03/15 190 lb 12 oz (86.524 kg)  12/12/14 190 lb 8 oz (86.41 kg)   Exam: deferred  ASSESSMENT AND PLAN: Problem List Items  Addressed This Visit      Cardiovascular and Mediastinum   Essential hypertension    BP at target today. Urine MA normal 03/2014.            Other   Diabetes type 2, uncontrolled - Primary    Recent A1c trending up. Discussed that goal A1c should be closer to 7%.  Discussed dietary measures and he was  asked to eat small meals atleast three times daily. Asked to avoid snacking in between meals, and eat at consistent times of the day. He has done better with this.  Done better with sugar check. Lower numbers in the morning, not using bolus during the daytime.  Patient Instructions  Check sugars 3 times daily ( fasting and premeal readings at alternating times, or at bedtime).  Record them in a sugar log and bring log and meter to next appointment.   Cut back lantus to 40 units daily at bedtime. Start using Humalog at 4 units with each meal if sugars prior to meals are >150. Let me know if you continue to have morning time lows or day time lows after starting to use Humalog on a regular basis.   Please come back for a follow-up appointment in 1 month.      At the next few visits, would like to wean off SU and try him on basal/bolus regimen.                    - Return to clinic in 4 weeks with sugar log/meter.  Chevette Fee [facility] 02/09/2015 10:40 AM

## 2015-02-06 NOTE — Patient Instructions (Addendum)
Check sugars 3 times daily ( fasting and premeal readings at alternating times, or at bedtime).  Record them in a sugar log and bring log and meter to next appointment.   Cut back lantus to 40 units daily at bedtime. Start using Humalog at 4 units with each meal if sugars prior to meals are >150. Let me know if you continue to have morning time lows or day time lows after starting to use Humalog on a regular basis.   Please come back for a follow-up appointment in 1 month.

## 2015-02-09 NOTE — Assessment & Plan Note (Signed)
BP at target today. Urine MA normal 03/2014.

## 2015-02-09 NOTE — Assessment & Plan Note (Signed)
Recent A1c trending up. Discussed that goal A1c should be closer to 7%.  Discussed dietary measures and he was asked to eat small meals atleast three times daily. Asked to avoid snacking in between meals, and eat at consistent times of the day. He has done better with this.  Done better with sugar check. Lower numbers in the morning, not using bolus during the daytime.  Patient Instructions  Check sugars 3 times daily ( fasting and premeal readings at alternating times, or at bedtime).  Record them in a sugar log and bring log and meter to next appointment.   Cut back lantus to 40 units daily at bedtime. Start using Humalog at 4 units with each meal if sugars prior to meals are >150. Let me know if you continue to have morning time lows or day time lows after starting to use Humalog on a regular basis.   Please come back for a follow-up appointment in 1 month.      At the next few visits, would like to wean off SU and try him on basal/bolus regimen.

## 2015-02-19 ENCOUNTER — Telehealth: Payer: Self-pay

## 2015-02-19 MED ORDER — VALACYCLOVIR HCL 1 G PO TABS: 1000.0000 mg | ORAL_TABLET | Freq: Three times a day (TID) | ORAL | Status: DC

## 2015-02-19 NOTE — Telephone Encounter (Signed)
Lady left v/m that pt has shingles again in same area of buttock when pt was seen 09/10/14 and requesting refill of med for shingles. Does pt need to be seen?

## 2015-02-19 NOTE — Telephone Encounter (Signed)
Jonathan [name] left note requesting status of shingles request.

## 2015-02-19 NOTE — Telephone Encounter (Signed)
Sent valtrex to pharmacy F/u if worse or no imp

## 2015-02-19 NOTE — Telephone Encounter (Signed)
Left voicemail letting pt/ Faith know Rx sent to pharmacy and to f/u if no improvement

## 2015-03-06 ENCOUNTER — Ambulatory Visit (INDEPENDENT_AMBULATORY_CARE_PROVIDER_SITE_OTHER): Payer: Medicare Other | Admitting: Endocrinology

## 2015-03-06 ENCOUNTER — Encounter: Payer: Self-pay | Admitting: Endocrinology

## 2015-03-06 VITALS — BP 136/60 | HR 94 | Resp 16 | Ht 67.0 in | Wt 189.5 lb

## 2015-03-06 DIAGNOSIS — E785 Hyperlipidemia, unspecified: Secondary | ICD-10-CM | POA: Diagnosis not present

## 2015-03-06 DIAGNOSIS — E1165 Type 2 diabetes mellitus with hyperglycemia: Secondary | ICD-10-CM

## 2015-03-06 DIAGNOSIS — I10 Essential (primary) hypertension: Secondary | ICD-10-CM | POA: Diagnosis not present

## 2015-03-06 DIAGNOSIS — IMO0002 Reserved for concepts with insufficient information to code with codable children: Secondary | ICD-10-CM

## 2015-03-06 LAB — COMPREHENSIVE METABOLIC PANEL
ALT: 19 U/L (ref 0–53)
AST: 19 U/L (ref 0–37)
Albumin: 4.7 g/dL (ref 3.5–5.2)
Alkaline Phosphatase: 75 U/L (ref 39–117)
BUN: 14 mg/dL (ref 6–23)
CO2: 27 mEq/L (ref 19–32)
Calcium: 9.6 mg/dL (ref 8.4–10.5)
Chloride: 98 mEq/L (ref 96–112)
Creatinine, Ser: 1.01 mg/dL (ref 0.40–1.50)
GFR: 76.6 mL/min (ref >60.00)
Glucose, Bld: 128 mg/dL — ABNORMAL HIGH (ref 70–99)
Potassium: 4.5 mEq/L (ref 3.5–5.1)
Sodium: 138 mEq/L (ref 135–145)
Total Bilirubin: 0.9 mg/dL (ref 0.2–1.2)
Total Protein: 7.3 g/dL (ref 6.0–8.3)

## 2015-03-06 LAB — LDL CHOLESTEROL, DIRECT: Direct LDL: 81 mg/dL

## 2015-03-06 LAB — HEMOGLOBIN A1C: Hgb A1c MFr Bld: 7.2 % — ABNORMAL HIGH (ref 4.6–6.5)

## 2015-03-06 LAB — LIPID PANEL
Cholesterol: 158 mg/dL (ref 0–200)
HDL: 47 mg/dL (ref >39.00)
NonHDL: 111
Total CHOL/HDL Ratio: 3
Triglycerides: 305 mg/dL — ABNORMAL HIGH (ref 0.0–149.0)
VLDL: 61 mg/dL — ABNORMAL HIGH (ref 0.0–40.0)

## 2015-03-06 NOTE — Progress Notes (Signed)
Pre visit review using our clinic review tool, if applicable. No additional management support is needed unless otherwise documented below in the visit note. 

## 2015-03-06 NOTE — Progress Notes (Signed)
Reason for visit-  Jonathan [name] is a 74 y.o.-year-old male, here for follow up management of Type 2 diabetes, uncontrolled, without complications. 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 Not here with caretaker today as she had to go on family emergency. Last seen May 2016.   HPI- Patient has been diagnosed with diabetes in 1990s. Recalls being initially on lifestyle modifications.  he has been on insulin since ~2013.   * Around early 2015, he was hospitalized after syncope, was in ICU, was on assisted ventilation, expressive aphasia recovered- believed to have a stroke, now better. Was probably given some steroids then. No recent steroids.     Pt is currently on a regimen of: -Metformin 1000 mg am, 500 mg afternoon and 1000 mg pm -Glipizide XL 10 mg daily - Lantus 40 units qhs (decreased from last visit due to lower morning readings) - Humalog 4 units qac TID, if FS>150 ( hasn't used it recently, in >3 months)   Last hemoglobin A1c was: Lab Results  Component Value Date   HGBA1C 7.2* 03/06/2015   HGBA1C 7.9* 12/01/2014   HGBA1C 7.5* 09/01/2014     Pt checks his sugars 3 a day . Uses AccuChek Glucometer. By log book they are:  PREMEAL Breakfast Lunch Dinner Bedtime Overall  Glucose range: 82-135 104-221 89-200    Mean/median:        POST-MEAL PC Breakfast PC Lunch PC Dinner  Glucose range:     Mean/median:      *variable eating times, snacks in between meals, Bedtime readings are sometimes within the hour-1.5 hours of eating a bedtime snack.   Hypoglycemia-  Some recent lows. Lowest sugar was 69; he does not have hypoglycemia awareness at 70.   Dietary habits- eats three times daily. Limits carbs, sweetened beverages, sodas, desserts. Gets full easily. Used to miss lunch consistently- snacks around 3pm. Some days, he eats a lot and some days he cant eat. Now trying to eat lunch little more consistently, trying to  increase veggie intake>>done better with this since past visit Exercise- cant do exercise, due to exercise, sob Weight -  Wt Readings from Last 3 Encounters:  03/06/15 189 lb 8 oz (85.957 kg)  02/06/15 186 lb 8 oz (84.596 kg)  01/03/15 190 lb 12 oz (86.524 kg)    Diabetes Complications-  Nephropathy- No, ? Developing Stage2  CKD, last BUN/creatinine- GFR 68-83 Lab Results  Component Value Date   BUN 14 03/06/2015   CREATININE 1.01 03/06/2015   Lab Results  Component Value Date   GFR 76.60 03/06/2015   MICRALBCREAT 12.7 04/28/2014    Retinopathy- No, Last DEE was in 2016 Neuropathy- no numbness and tingling in )his feet. No known neuropathy.  Associated history - No CAD . No hypothyroidism. his last TSH was  Lab Results  Component Value Date   TSH 0.447 11/28/2013    Hyperlipidemia-  his last set of lipids were- Currently on Lipitor 10. Tolerating well.   Lab Results  Component Value Date   CHOL 158 03/06/2015   HDL 47.00 03/06/2015   LDLCALC 58 09/01/2014   LDLDIRECT 81.0 03/06/2015   TRIG 305.0* 03/06/2015   CHOLHDL 3 03/06/2015    Blood Pressure/HTN- Patient's blood pressure is well controlled today on current regimen. 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  I have reviewed the patient's past medical history, medications and allergies.   Current Outpatient Prescriptions on File  Prior to Visit  Medication Sig Dispense Refill  . ACCU-CHEK AVIVA PLUS test strip     . albuterol (PROVENTIL) (2.5 MG/3ML) 0.083% nebulizer solution Take 3 mLs (2.5 mg total) by nebulization 2 (two) times daily as needed for shortness of breath. 75 mL 0  . albuterol (VENTOLIN HFA) 108 (90 BASE) MCG/ACT inhaler Inhale 2 puffs into the lungs every 6 (six) hours as needed for wheezing. 1 Inhaler 11  . amLODipine (NORVASC) 5 MG tablet Take 1 tablet (5 mg total) by mouth daily. 90 tablet 3  . aspirin 81 MG tablet Take 1 tablet (81 mg total) by mouth daily. 30 tablet 0  .  atorvastatin (LIPITOR) 10 MG tablet Take 1 tablet (10 mg total) by mouth daily. 90 tablet 3  . Blood Glucose Monitoring Suppl (ACCU-CHEK AVIVA PLUS) W/DEVICE KIT USE AS DIRECTED 1 kit 0  . glipiZIDE (GLUCOTROL XL) 10 MG 24 hr tablet Take 1 tablet by mouth  daily 90 tablet 0  . insulin glargine (LANTUS) 100 unit/mL SOPN Inject 43 Units into the skin daily at 8 pm.     . insulin lispro (HUMALOG) 100 UNIT/ML KiwkPen Inject 5 Units into the skin 3 (three) times daily with meals. For cbg >=150    . lamoTRIgine (LAMICTAL) 100 MG tablet Take 1 tablet (100 mg total) by mouth 2 (two) times daily. 180 tablet 3  . metFORMIN (GLUCOPHAGE) 1000 MG tablet Take 500-1,000 mg by mouth 3 (three) times daily. Take 1 tablet in the morning Take 0.5 tablet midday Take 1 tablet in the evening    . valACYclovir (VALTREX) 1000 MG tablet Take 1 tablet (1,000 mg total) by mouth 3 (three) times daily. 21 tablet 0   No current facility-administered medications on file prior to visit.   Allergies  Allergen Reactions  . Amoxicillin-Pot Clavulanate Other (See Comments)    Severe stomach pain.   . Quinapril Hcl     REACTION: back pain  . Valsartan     REACTION: back pain  . Varenicline Tartrate     REACTION: AMS, hallucinations, night mares   Review of Systems- [ x ]  Complains of    [  ]  denies [  ] Recent weight change [  ]  Fatigue [  ] polydipsia [  ] polyuria [  ]  nocturia [  ]  vision difficulty [  ] chest pain [  x] stable shortness of breath [  ] leg swelling [ x ] cough [  ] nausea/vomiting [  ] diarrhea [  ] constipation [  ] abdominal pain [  ]  tingling/numbness in extremities [  ]  concern with feet ( wounds/sores)   PE: BP 136/60 mmHg  Pulse 94  Resp 16  Ht 5' 7" (1.702 m)  Wt 189 lb 8 oz (85.957 kg)  BMI 29.67 kg/m2  SpO2 95% Wt Readings from Last 3 Encounters:  03/06/15 189 lb 8 oz (85.957 kg)  02/06/15 186 lb 8 oz (84.596 kg)  01/03/15 190 lb 12 oz (86.524 kg)   Exam:  deferred  ASSESSMENT AND PLAN: Problem List Items Addressed This Visit      Cardiovascular and Mediastinum   Essential hypertension    BP at target today. Urine MA normal 03/2014.            Relevant Orders   Comprehensive metabolic panel (Completed)   Hemoglobin A1c (Completed)   Lipid panel (Completed)     Other   HLD (hyperlipidemia)  Recent LDL at goal on current therapy.   Update today      Relevant Orders   Comprehensive metabolic panel (Completed)   Hemoglobin A1c (Completed)   Lipid panel (Completed)   Diabetes type 2, uncontrolled - Primary    Recent A1c trending up. Discussed that goal A1c should be closer to 7%. Update today.  Discussed dietary measures and he was asked to eat small meals atleast three times daily. Asked to avoid snacking in between meals, and eat at consistent times of the day. He has done better with this.  Done better with sugar check. Lower numbers in the morning, not using bolus during the daytime.  Patient Instructions  Labs today. Check sugars 3 times daily ( fasting and premeal readings at alternating times, or at bedtime).  Record them in a sugar log and bring log and meter to next appointment.   Continue current metformin, glipizide xl continue lantus at 40 units daily Start Humalog 4 units for pre meal sugars >150 at your heaviest meals, atleast once daily  Please come back for a follow-up appointment in 6 weeks      At the next few visits, would like to wean off SU and try him on basal/bolus regimen.                  Relevant Orders   Comprehensive metabolic panel (Completed)   Hemoglobin A1c (Completed)   Lipid panel (Completed)       - Return to clinic in 6 weeks with sugar log/meter. Explained that [place]- we will set him up for follow up at [facility] location.  , [facility] 03/08/2015 2:24 PM

## 2015-03-06 NOTE — Patient Instructions (Signed)
Labs today. Check sugars 3 times daily ( fasting and premeal readings at alternating times, or at bedtime).  Record them in a sugar log and bring log and meter to next appointment.   Continue current metformin, glipizide xl continue lantus at 40 units daily Start Humalog 4 units for pre meal sugars >150 at your heaviest meals, atleast once daily  Please come back for a follow-up appointment in 6 weeks

## 2015-03-07 ENCOUNTER — Encounter: Payer: Self-pay | Admitting: *Deleted

## 2015-03-08 NOTE — Assessment & Plan Note (Signed)
BP at target today. Urine MA normal 03/2014.

## 2015-03-08 NOTE — Assessment & Plan Note (Signed)
Recent LDL at goal on current therapy.   Update today

## 2015-03-08 NOTE — Assessment & Plan Note (Signed)
Recent A1c trending up. Discussed that goal A1c should be closer to 7%. Update today.  Discussed dietary measures and he was asked to eat small meals atleast three times daily. Asked to avoid snacking in between meals, and eat at consistent times of the day. He has done better with this.  Done better with sugar check. Lower numbers in the morning, not using bolus during the daytime.  Patient Instructions  Labs today. Check sugars 3 times daily ( fasting and premeal readings at alternating times, or at bedtime).  Record them in a sugar log and bring log and meter to next appointment.   Continue current metformin, glipizide xl continue lantus at 40 units daily Start Humalog 4 units for pre meal sugars >150 at your heaviest meals, atleast once daily  Please come back for a follow-up appointment in 6 weeks      At the next few visits, would like to wean off SU and try him on basal/bolus regimen.

## 2015-03-09 ENCOUNTER — Other Ambulatory Visit: Payer: Self-pay | Admitting: Family Medicine

## 2015-03-12 ENCOUNTER — Other Ambulatory Visit: Payer: Self-pay | Admitting: Family Medicine

## 2015-04-05 ENCOUNTER — Other Ambulatory Visit: Payer: Self-pay | Admitting: *Deleted

## 2015-04-05 MED ORDER — ACCU-CHEK AVIVA PLUS VI STRP: ORAL_STRIP | Status: DC

## 2015-06-07 ENCOUNTER — Encounter: Payer: Self-pay | Admitting: Family Medicine

## 2015-06-07 ENCOUNTER — Encounter: Payer: Self-pay | Admitting: Internal Medicine

## 2015-06-07 ENCOUNTER — Ambulatory Visit (INDEPENDENT_AMBULATORY_CARE_PROVIDER_SITE_OTHER): Payer: Medicare Other | Admitting: Internal Medicine

## 2015-06-07 ENCOUNTER — Other Ambulatory Visit (INDEPENDENT_AMBULATORY_CARE_PROVIDER_SITE_OTHER): Payer: Medicare Other | Admitting: *Deleted

## 2015-06-07 DIAGNOSIS — E1165 Type 2 diabetes mellitus with hyperglycemia: Secondary | ICD-10-CM | POA: Diagnosis not present

## 2015-06-07 LAB — POCT GLYCOSYLATED HEMOGLOBIN (HGB A1C): Hemoglobin A1C: 7

## 2015-06-07 NOTE — Progress Notes (Signed)
Patient ID: Jonathan [name], male   DOB: 1941/01/24, 74 y.o.   MRN: [id number]  Jonathan [name] is a 74 y.o.-year-old male, coming for follow up management of Type 2 diabetes, dx 1990, uncontrolled, insulin dep since ~6160, without complications. Associated hx of expressive aphasia (now recovered), seizures, and medical non compliance.  Last seen 02/2015 by Dr Howell Rucks whom since left the practice - reviewed her note.  * Around early 2015, he was hospitalized after syncope, was in ICU, was on assisted ventilation, expressive aphasia recovered- believed to have a stroke, now better.   Last hemoglobin A1c was: Lab Results  Component Value Date   HGBA1C 7.2* 03/06/2015   HGBA1C 7.9* 12/01/2014   HGBA1C 7.5* 09/01/2014   He is on: - Metformin 1000 mg am, 500 mg afternoon and 1000 mg pm - Glipizide XL 10 mg daily - Lantus 40 units qhs (decreased due to lower morning readings) Humalog 4 units qac TID, if FS>150 ( hasn't used it recently, in >3 months)  Pt checks his sugars 3 a day . Uses AccuChek Glucometer. By log book they are: - am: 82-135 >> 66, 87-123 - 2h after b'fast: n/c - lunch: 104-221 >> 92, 100-159, 228, 259 (after fish) - 2h after lunch: n/c - dinner: n/c - 2h after dinner: 89-200 >> 113-215 - bedtime: n/c  *variable eating times, snacks in between meals, Bedtime readings are sometimes within the hour-1.5 hours of eating a bedtime snack.   Hypoglycemia-  Some recent lows. Lowest sugar was 66; he does not have hypoglycemia awareness at 70.   Dietary habits- eats three times daily.  Exercise- cant do exercise, due to exercise, sob  Diabetes Complications-  Nephropathy- No, ? Developing Stage2  CKD, last BUN/creatinine- GFR 68-83 Lab Results  Component Value Date   BUN 14 03/06/2015   CREATININE 1.01 03/06/2015   Lab Results  Component Value Date   GFR 76.60 03/06/2015   MICRALBCREAT 12.7 04/28/2014   Allergic to ACE-I and ARB. Retinopathy- No, Last eye exam was in  2016 Neuropathy- no numbness and tingling in feet. No known neuropathy.  Associated history - No CAD . No hypothyroidism. his last TSH was  Lab Results  Component Value Date   TSH 0.447 11/28/2013   Has HTG: Lab Results  Component Value Date   CHOL 158 03/06/2015   HDL 47.00 03/06/2015   LDLCALC 58 09/01/2014   LDLDIRECT 81.0 03/06/2015   TRIG 305.0* 03/06/2015   CHOLHDL 3 03/06/2015  Currently on Lipitor 10. Tolerating well.    Smokes 1 PPD  ROS: Constitutional: no weight gain/loss, + fatigue, no subjective hyperthermia/hypothermia Eyes: no blurry vision, no xerophthalmia ENT: no sore throat, no nodules palpated in throat, no dysphagia/odynophagia, no hoarseness Cardiovascular: no CP/+ SOB/no palpitations/leg swelling Respiratory: + cough/+ SOB/+ wheezing Gastrointestinal: no N/V/D/C Musculoskeletal: no muscle/+ joint aches Skin: no rashes, + easy bruising Neurological: no tremors/numbness/tingling/dizziness Psychiatric: no depression/anxiety + low libido  Past Medical History  Diagnosis Date  . HTN (hypertension)   . HLD (hyperlipidemia)   . Pulmonary nodule, right     lower lobe  . COPD (chronic obstructive pulmonary disease)   . Rotator cuff injury     right  . Frozen shoulder   . Shoulder pain   . Hyperkalemia   . Dermatitis seborrheica   . Other diseases of lung, not elsewhere classified   . Kidney stone   . Allergic rhinitis   . ED (erectile dysfunction)   . Diabetes mellitus type  II   . Asthma   . Tobacco abuse   . Stroke     pt states "last year"   Past Surgical History  Procedure Laterality Date  . Cervical discectomy  1998  . Ett  11/1994  . Tm repair    . Tee without cardioversion  12/03/2011    Procedure: TRANSESOPHAGEAL ECHOCARDIOGRAM (TEE);  Surgeon: Lelon Perla, MD;  Location: [facility] ENDOSCOPY;  Service: Cardiovascular;  Laterality: N/A;  . Eeg     Social History   Social History  . Marital Status: Married    Spouse Name: N/A  .  Number of Children: N/A  . Years of Education: N/A   Occupational History  . Retired     Air cabin crew   Social History Main Topics  . Smoking status: Current Some Day Smoker -- 0.20 packs/day    Types: Cigarettes  . Smokeless tobacco: Never Used     Comment: prev smoked 1 1/2 PPD. Currently 2 cigs daily  . Alcohol Use: No     Comment: Quit 40 years ago  . Drug Use: No  . Sexual Activity: No   Other Topics Concern  . Not on file   Social History Narrative   Current Outpatient Prescriptions on File Prior to Visit  Medication Sig Dispense Refill  . ACCU-CHEK AVIVA PLUS test strip Check sugars 3-4 times daily. Dx E11/65 200 each 5  . albuterol (PROVENTIL) (2.5 MG/3ML) 0.083% nebulizer solution Take 3 mLs (2.5 mg total) by nebulization 2 (two) times daily as needed for shortness of breath. 75 mL 0  . albuterol (VENTOLIN HFA) 108 (90 BASE) MCG/ACT inhaler Inhale 2 puffs into the lungs every 6 (six) hours as needed for wheezing. 1 Inhaler 11  . amLODipine (NORVASC) 5 MG tablet Take 1 tablet by mouth  daily 90 tablet 0  . aspirin 81 MG tablet Take 1 tablet (81 mg total) by mouth daily. 30 tablet 0  . atorvastatin (LIPITOR) 10 MG tablet Take 1 tablet by mouth  daily 90 tablet 0  . Blood Glucose Monitoring Suppl (ACCU-CHEK AVIVA PLUS) W/DEVICE KIT USE AS DIRECTED 1 kit 0  . glipiZIDE (GLUCOTROL XL) 10 MG 24 hr tablet Take 1 tablet by mouth  daily 90 tablet 0  . insulin glargine (LANTUS) 100 unit/mL SOPN Inject 43 Units into the skin daily at 8 pm.     . insulin lispro (HUMALOG) 100 UNIT/ML KiwkPen Inject 5 Units into the skin 3 (three) times daily with meals. For cbg >=150    . lamoTRIgine (LAMICTAL) 100 MG tablet Take 1 tablet (100 mg total) by mouth 2 (two) times daily. 180 tablet 3  . metFORMIN (GLUCOPHAGE) 1000 MG tablet Take 1 tablet in the  morning, one-half tablet by mouth midday and 1 tablet  in the evening 225 tablet 0  . valACYclovir (VALTREX) 1000 MG tablet TAKE 1 TABLET BY  MOUTH THREE TIMES A DAY 21 tablet 0   No current facility-administered medications on file prior to visit.   Allergies  Allergen Reactions  . Amoxicillin-Pot Clavulanate Other (See Comments)    Severe stomach pain.   . Quinapril Hcl     REACTION: back pain  . Valsartan     REACTION: back pain  . Varenicline Tartrate     REACTION: AMS, hallucinations, night mares   Family History  Problem Relation Age of Onset  . Asthma Sister   . Emphysema Sister   . Diabetes Mother   . Diabetes Brother   .  Heart disease Brother    PE: BP 124/68 mmHg  Pulse 95  Temp(Src) 97.8 F (36.6 C) (Oral)  Resp 12  Wt 183 lb 9.6 oz (83.28 kg)  SpO2 100% Body mass index is 28.75 kg/(m^2). Wt Readings from Last 3 Encounters:  06/07/15 183 lb 9.6 oz (83.28 kg)  03/06/15 189 lb 8 oz (85.957 kg)  02/06/15 186 lb 8 oz (84.596 kg)   Constitutional: overweight, in NAD Eyes: PERRLA, EOMI, no exophthalmos ENT: moist mucous membranes, no thyromegaly, no cervical lymphadenopathy Cardiovascular: RRR, No MRG Respiratory: + bilateral wheezing over entire pulm fields Gastrointestinal: abdomen soft, NT, ND, BS+ Musculoskeletal: no deformities, strength intact in all 4 Skin: moist, warm, no rashes Neurological: no tremor with outstretched hands, DTR normal in all 4  ASSESSMENT: 1. DM2, insulin-dependent, uncontrolled, without complications  PLAN:  1. Patient with long-standing, uncontrolled diabetes, on oral antidiabetic regimen + basal insulin, with overall reasonable control >> diet relaxed in last month >> sugars later in the day higher. We discussed about improving diet and since sugars in am are low >> will decrease Lantus - I suggested to:  Patient Instructions  Please decrease: - Lantus to 36 units at bedtime  Continue: - Glipizide XL 10 mg daily - Metformin 1000 mg am, 500 mg afternoon and 1000 mg pm  Please return in 3 months with your sugar log.   - conttinue checking sugars at different  times of the day - check 2-3 times a day, rotating checks - given sugar log and advised how to fill it and to bring it at next appt  - given foot care handout and explained the principles  - given instructions for hypoglycemia management "15-15 rule"  - advised for yearly eye exams >> he is UTD - advised him to try to quit smoking - checked Hba1c today >> 7.0% - Return to clinic in 3 mo with sugar log   - time spent with the patient: 40 min, of which >50% was spent in obtaining information about his DM2, reviewing his previous labs, evaluations, and treatments, counseling him about his condition (please see the discussed topics above), and developing a plan to further investigate and treat it.

## 2015-06-07 NOTE — Patient Instructions (Signed)
Please decrease: - Lantus to 36 units at bedtime  Continue: - Glipizide XL 10 mg daily - Metformin 1000 mg am, 500 mg afternoon and 1000 mg pm  Please return in 3 months with your sugar log.   PATIENT INSTRUCTIONS FOR TYPE 2 DIABETES:  **Please join MyChart!** - see attached instructions about how to join if you have not done so already.  DIET AND EXERCISE Diet and exercise is an important part of diabetic treatment.  We recommended aerobic exercise in the form of brisk walking (working between 40-60% of maximal aerobic capacity, similar to brisk walking) for 150 minutes per week (such as 30 minutes five days per week) along with 3 times per week performing 'resistance' training (using various gauge rubber tubes with handles) 5-10 exercises involving the major muscle groups (upper body, lower body and core) performing 10-15 repetitions (or near fatigue) each exercise. Start at half the above goal but build slowly to reach the above goals. If limited by weight, joint pain, or disability, we recommend daily walking in a swimming pool with water up to waist to reduce pressure from joints while allow for adequate exercise.    BLOOD GLUCOSES Monitoring your blood glucoses is important for continued management of your diabetes. Please check your blood glucoses 2-4 times a day: fasting, before meals and at bedtime (you can rotate these measurements - e.g. one day check before the 3 meals, the next day check before 2 of the meals and before bedtime, etc.).   HYPOGLYCEMIA (low blood sugar) Hypoglycemia is usually a reaction to not eating, exercising, or taking too much insulin/ other diabetes drugs.  Symptoms include tremors, sweating, hunger, confusion, headache, etc. Treat IMMEDIATELY with 15 grams of Carbs: . 4 glucose tablets .  cup regular juice/soda . 2 tablespoons raisins . 4 teaspoons sugar . 1 tablespoon honey Recheck blood glucose in 15 mins and repeat above if still symptomatic/blood  glucose <100.  RECOMMENDATIONS TO REDUCE YOUR RISK OF DIABETIC COMPLICATIONS: * Take your prescribed MEDICATION(S) * Follow a DIABETIC diet: Complex carbs, fiber rich foods, (monounsaturated and polyunsaturated) fats * AVOID saturated/trans fats, high fat foods, >2,300 mg salt per day. * EXERCISE at least 5 times a week for 30 minutes or preferably daily.  * DO NOT SMOKE OR DRINK more than 1 drink a day. * Check your FEET every day. Do not wear tightfitting shoes. Contact us if you develop an ulcer * See your EYE doctor once a year or more if needed * Get a FLU shot once a year * Get a PNEUMONIA vaccine once before and once after age 39 years  GOALS:  * Your Hemoglobin A1c of <7%  * fasting sugars need to be <130 * after meals sugars need to be <180 (2h after you start eating) * Your Systolic BP should be 660 or lower  * Your Diastolic BP should be 80 or lower  * Your HDL (Good Cholesterol) should be 40 or higher  * Your LDL (Bad Cholesterol) should be 100 or lower. * Your Triglycerides should be 150 or lower  * Your Urine microalbumin (kidney function) should be <30 * Your Body Mass Index should be 25 or lower   Please consider the following ways to cut down carbs and fat and increase fiber and micronutrients in your diet: - substitute whole grain for white bread or pasta - substitute brown rice for white rice - substitute 90-calorie flat bread pieces for slices of bread when possible - substitute sweet  potatoes or yams for white potatoes - substitute humus for margarine - substitute tofu for cheese when possible - substitute almond or rice milk for regular milk (would not drink soy milk daily due to concern for soy estrogen influence on breast cancer risk) - substitute dark chocolate for other sweets when possible - substitute water - can add lemon or orange slices for taste - for diet sodas (artificial sweeteners will trick your body that you can eat sweets without getting  calories and will lead you to overeating and weight gain in the long run) - do not skip breakfast or other meals (this will slow down the metabolism and will result in more weight gain over time)  - can try smoothies made from fruit and almond/rice milk in am instead of regular breakfast - can also try old-fashioned (not instant) oatmeal made with almond/rice milk in am - order the dressing on the side when eating salad at a restaurant (pour less than half of the dressing on the salad) - eat as little meat as possible - can try juicing, but should not forget that juicing will get rid of the fiber, so would alternate with eating raw veg./fruits or drinking smoothies - use as little oil as possible, even when using olive oil - can dress a salad with a mix of balsamic vinegar and lemon juice, for e.g. - use agave nectar, stevia sugar, or regular sugar rather than artificial sweateners - steam or broil/roast veggies  - snack on veggies/fruit/nuts (unsalted, preferably) when possible, rather than processed foods - reduce or eliminate aspartame in diet (it is in diet sodas, chewing gum, etc) Read the labels!  Try to read Dr. Janene Harvey book: "Program for Reversing Diabetes" for other ideas for healthy eating.

## 2015-06-08 ENCOUNTER — Telehealth: Payer: Self-pay

## 2015-06-08 ENCOUNTER — Telehealth: Payer: Self-pay | Admitting: Family Medicine

## 2015-06-08 MED ORDER — INSULIN PEN NEEDLE 32G X 6 MM MISC: Status: DC

## 2015-06-08 NOTE — Telephone Encounter (Signed)
Pt called checking on his rx for needles  31g c 5/16    (24mm) [place]  Pt called twice today and came by yesterday Please call and let pt know when this has been called

## 2015-06-08 NOTE — Telephone Encounter (Signed)
See prev. note. Routing call to pt's endo doc who manages DM

## 2015-06-08 NOTE — Telephone Encounter (Signed)
Sent!

## 2015-06-08 NOTE — Telephone Encounter (Signed)
Pt left note about receiving refills on meds; spoke with pt and he does not know the name of meds at this time and will cb with info at later time.

## 2015-06-08 NOTE — Telephone Encounter (Signed)
Larene [place], please send a Rx for pen needles 32 or 33 gauge, 4 or 5 mm long. Thank you!

## 2015-07-17 ENCOUNTER — Other Ambulatory Visit: Payer: Self-pay | Admitting: Family Medicine

## 2015-07-18 NOTE — Telephone Encounter (Signed)
Rout to Dr. Cruzita Lederer since she is now pt's endocrinologist

## 2015-08-30 ENCOUNTER — Other Ambulatory Visit: Payer: Self-pay | Admitting: Family Medicine

## 2015-09-06 ENCOUNTER — Other Ambulatory Visit (INDEPENDENT_AMBULATORY_CARE_PROVIDER_SITE_OTHER): Payer: Medicare Other | Admitting: *Deleted

## 2015-09-06 ENCOUNTER — Ambulatory Visit (INDEPENDENT_AMBULATORY_CARE_PROVIDER_SITE_OTHER): Payer: Medicare Other | Admitting: Internal Medicine

## 2015-09-06 ENCOUNTER — Encounter: Payer: Self-pay | Admitting: Internal Medicine

## 2015-09-06 VITALS — BP 122/64 | HR 110 | Temp 98.3°F | Resp 12 | Wt 185.0 lb

## 2015-09-06 DIAGNOSIS — E1165 Type 2 diabetes mellitus with hyperglycemia: Secondary | ICD-10-CM

## 2015-09-06 DIAGNOSIS — Z794 Long term (current) use of insulin: Secondary | ICD-10-CM | POA: Diagnosis not present

## 2015-09-06 DIAGNOSIS — IMO0001 Reserved for inherently not codable concepts without codable children: Secondary | ICD-10-CM

## 2015-09-06 LAB — POCT GLYCOSYLATED HEMOGLOBIN (HGB A1C): Hemoglobin A1C: 6.8

## 2015-09-06 MED ORDER — METFORMIN HCL 1000 MG PO TABS: ORAL_TABLET | ORAL | Status: DC

## 2015-09-06 NOTE — Patient Instructions (Signed)
Please decrease: - Lantus to 30 units at bedtime  Continue: - Glipizide XL 10 mg daily  Decrease: - Metformin to 1000 mg am and 1000 mg pm  Please return in 3 months with your sugar log.

## 2015-09-06 NOTE — Progress Notes (Signed)
Patient ID: Jonathan [name], male   DOB: 01-27-1941, 74 y.o.   MRN: [id number]  Jonathan [name] is a 74 y.o.-year-old male, coming for follow up management of Type 2 diabetes, dx 1990, uncontrolled, insulin dep since ~6979, without complications. Associated hx of expressive aphasia (now recovered), seizures, and medical non compliance.  Last seen 02/2015 by Dr Howell Rucks whom since left the practice - reviewed her note.  * Around early 2015, he was hospitalized after syncope, was in ICU, was on assisted ventilation, expressive aphasia recovered- believed to have a stroke, now better.   Last hemoglobin A1c was: Lab Results  Component Value Date   HGBA1C 7.0 06/07/2015   HGBA1C 7.2* 03/06/2015   HGBA1C 7.9* 12/01/2014   He is on: - Metformin 1000 mg am, 500 mg afternoon and 1000 mg pm - Glipizide XL 10 mg daily - Lantus 40 >> 36 units qhs Humalog 4 units qac TID, if FS>150 ( hasn't used it recently, in >3 months)  Pt checks his sugars 3 a day . Uses AccuChek Glucometer. By log book they are: - am: 82-135 >> 66, 87-123 >> 69, 81-121 - 2h after b'fast: n/c - lunch: 104-221 >> 92, 100-159, 228, 259 (after fish) >> 67, 73-159 - 2h after lunch: n/c - dinner: n/c - 2h after dinner: 89-200 >> 113-215 >> 109-175, 190, 217 - bedtime: n/c  Dietary habits- eats three times daily.  Exercise- cannot exercise, due to exercise, sob  Diabetes Complications-  Stage 2  CKD, last BUN/creatinine- GFR 68-83 Lab Results  Component Value Date   BUN 14 03/06/2015   CREATININE 1.01 03/06/2015   Lab Results  Component Value Date   GFR 76.60 03/06/2015   MICRALBCREAT 12.7 04/28/2014   Allergic to ACE-I and ARB. Retinopathy- No, Last eye exam was in 2016 Neuropathy- no numbness and tingling in feet. No known neuropathy.  Associated history - No CAD . No hypothyroidism. his last TSH was  Lab Results  Component Value Date   TSH 0.447 11/28/2013   Has HTG: Lab Results  Component Value Date   CHOL 158  03/06/2015   HDL 47.00 03/06/2015   LDLCALC 58 09/01/2014   LDLDIRECT 81.0 03/06/2015   TRIG 305.0* 03/06/2015   CHOLHDL 3 03/06/2015  Currently on Lipitor 10. Tolerating well.    Smokes 1 PPD.  ROS: Constitutional: no weight gain/loss, no fatigue, no subjective hyperthermia/hypothermia Eyes: no blurry vision, no xerophthalmia ENT: no sore throat, no nodules palpated in throat, no dysphagia/odynophagia, no hoarseness Cardiovascular: no CP/+ SOB/no palpitations/leg swelling Respiratory: + cough/+ SOB/+ wheezing Gastrointestinal: no N/V/D/C Musculoskeletal: + muscle/no joint aches Skin: no rashes Neurological: no tremors/numbness/tingling/dizziness  I reviewed pt's medications, allergies, PMH, social hx, family hx, and changes were documented in the history of present illness. Otherwise, unchanged from my initial visit note.  Past Medical History  Diagnosis Date  . HTN (hypertension)   . HLD (hyperlipidemia)   . Pulmonary nodule, right     lower lobe  . COPD (chronic obstructive pulmonary disease)   . Rotator cuff injury     right  . Frozen shoulder   . Shoulder pain   . Hyperkalemia   . Dermatitis seborrheica   . Other diseases of lung, not elsewhere classified   . Kidney stone   . Allergic rhinitis   . ED (erectile dysfunction)   . Diabetes mellitus type II   . Asthma   . Tobacco abuse   . Stroke     pt states "  last year"   Past Surgical History  Procedure Laterality Date  . Cervical discectomy  1998  . Ett  11/1994  . Tm repair    . Tee without cardioversion  12/03/2011    Procedure: TRANSESOPHAGEAL ECHOCARDIOGRAM (TEE);  Surgeon: Lelon Perla, MD;  Location: [facility] ENDOSCOPY;  Service: Cardiovascular;  Laterality: N/A;  . Eeg     Social History   Social History  . Marital Status: Married    Spouse Name: N/A  . Number of Children: N/A  . Years of Education: N/A   Occupational History  . Retired     Air cabin crew   Social History Main Topics  .  Smoking status: Current Some Day Smoker -- 0.20 packs/day    Types: Cigarettes  . Smokeless tobacco: Never Used     Comment: prev smoked 1 1/2 PPD. Currently 2 cigs daily  . Alcohol Use: No     Comment: Quit 40 years ago  . Drug Use: No  . Sexual Activity: No   Other Topics Concern  . Not on file   Social History Narrative   Current Outpatient Prescriptions on File Prior to Visit  Medication Sig Dispense Refill  . ACCU-CHEK AVIVA PLUS test strip Check sugars 3-4 times daily. Dx E11/65 200 each 5  . albuterol (PROVENTIL) (2.5 MG/3ML) 0.083% nebulizer solution Take 3 mLs (2.5 mg total) by nebulization 2 (two) times daily as needed for shortness of breath. 75 mL 0  . albuterol (VENTOLIN HFA) 108 (90 BASE) MCG/ACT inhaler Inhale 2 puffs into the lungs every 6 (six) hours as needed for wheezing. 1 Inhaler 11  . amLODipine (NORVASC) 5 MG tablet Take 1 tablet by mouth  daily 90 tablet 0  . aspirin 81 MG tablet Take 1 tablet (81 mg total) by mouth daily. 30 tablet 0  . atorvastatin (LIPITOR) 10 MG tablet Take 1 tablet by mouth  daily 90 tablet 0  . Blood Glucose Monitoring Suppl (ACCU-CHEK AVIVA PLUS) W/DEVICE KIT USE AS DIRECTED 1 kit 0  . glipiZIDE (GLUCOTROL XL) 10 MG 24 hr tablet Take 1 tablet by mouth  daily 90 tablet 0  . insulin glargine (LANTUS) 100 unit/mL SOPN Inject 43 Units into the skin daily at 8 pm.     . insulin lispro (HUMALOG) 100 UNIT/ML KiwkPen Inject 5 Units into the skin 3 (three) times daily with meals. For cbg >=150    . Insulin Pen Needle (CAREFINE PEN NEEDLES) 32G X 6 MM MISC Use to inject insulin at bedtime. 90 each 2  . lamoTRIgine (LAMICTAL) 100 MG tablet Take 1 tablet (100 mg total) by mouth 2 (two) times daily. 180 tablet 3  . metFORMIN (GLUCOPHAGE) 1000 MG tablet TAKE 1 TABLET BY MOUTH IN  THE MORNING, ONE-HALF  TABLET MIDDAY AND 1 TABLET  IN THE EVENING 225 tablet 1  . valACYclovir (VALTREX) 1000 MG tablet TAKE 1 TABLET BY MOUTH THREE TIMES A DAY 21 tablet 0    No current facility-administered medications on file prior to visit.   Allergies  Allergen Reactions  . Amoxicillin-Pot Clavulanate Other (See Comments)    Severe stomach pain.   . Quinapril Hcl     REACTION: back pain  . Valsartan     REACTION: back pain  . Varenicline Tartrate     REACTION: AMS, hallucinations, night mares   Family History  Problem Relation Age of Onset  . Asthma Sister   . Emphysema Sister   . Diabetes Mother   . Diabetes  Brother   . Heart disease Brother    PE: BP 122/64 mmHg  Pulse 110  Temp(Src) 98.3 F (36.8 C) (Oral)  Resp 12  Wt 185 lb (83.915 kg)  SpO2 95% Body mass index is 28.97 kg/(m^2). Wt Readings from Last 3 Encounters:  09/06/15 185 lb (83.915 kg)  06/07/15 183 lb 9.6 oz (83.28 kg)  03/06/15 189 lb 8 oz (85.957 kg)   Constitutional: overweight, in NAD Eyes: PERRLA, EOMI, no exophthalmos ENT: moist mucous membranes, no thyromegaly, no cervical lymphadenopathy Cardiovascular: RRR, No MRG Respiratory: + bilateral wheezing over entire pulm fields Gastrointestinal: abdomen soft, NT, ND, BS+ Musculoskeletal: no deformities, strength intact in all 4 Skin: moist, warm, no rashes Neurological: no tremor with outstretched hands, DTR normal in all 4  ASSESSMENT: 1. DM2, insulin-dependent, uncontrolled, without complications  PLAN:  1. Patient with long-standing, uncontrolled diabetes, on oral antidiabetic regimen + basal insulin, with improved control; sugars still lower in am >> will reduce Lantus further. Will also reduce Metformin to 1000 mg 2x a day: - I suggested to:  Patient Instructions  Please decrease: - Lantus to 30 units at bedtime  Continue: - Glipizide XL 10 mg daily  Decrease: - Metformin to 1000 mg am and 1000 mg pm  Please return in 3 months with your sugar log.  - conttinue checking sugars at different times of the day - check 2-3 times a day, rotating checks - advised for yearly eye exams >> he is UTD -  advised him to try to quit smoking >> he is cutting down - checked Hba1c today >> 6.8% (better) - Return to clinic in 3 mo with sugar log

## 2015-10-19 ENCOUNTER — Other Ambulatory Visit: Payer: Self-pay | Admitting: Family Medicine

## 2015-10-19 NOTE — Telephone Encounter (Signed)
Please f/u with me in March (labs prior if he can)  Refill all to get by until then

## 2015-10-19 NOTE — Telephone Encounter (Signed)
Last OV was 12/08/14 and no future appt., please advise

## 2015-10-19 NOTE — Telephone Encounter (Signed)
Left voicemail requesting pt to call office back 

## 2015-10-22 NOTE — Telephone Encounter (Signed)
appt scheduled and med refill 

## 2015-12-04 ENCOUNTER — Ambulatory Visit (INDEPENDENT_AMBULATORY_CARE_PROVIDER_SITE_OTHER): Payer: Medicare Other | Admitting: Internal Medicine

## 2015-12-04 ENCOUNTER — Encounter: Payer: Self-pay | Admitting: Internal Medicine

## 2015-12-04 ENCOUNTER — Other Ambulatory Visit (INDEPENDENT_AMBULATORY_CARE_PROVIDER_SITE_OTHER): Payer: Medicare Other | Admitting: *Deleted

## 2015-12-04 VITALS — BP 124/78 | HR 91 | Temp 97.5°F | Resp 12 | Wt 178.0 lb

## 2015-12-04 DIAGNOSIS — Z794 Long term (current) use of insulin: Secondary | ICD-10-CM | POA: Diagnosis not present

## 2015-12-04 DIAGNOSIS — E1165 Type 2 diabetes mellitus with hyperglycemia: Secondary | ICD-10-CM

## 2015-12-04 LAB — POCT GLYCOSYLATED HEMOGLOBIN (HGB A1C): Hemoglobin A1C: 7.4

## 2015-12-04 MED ORDER — INSULIN GLARGINE 100 UNITS/ML SOLOSTAR PEN: 25.0000 [IU] | PEN_INJECTOR | Freq: Every day | SUBCUTANEOUS | Status: DC

## 2015-12-04 MED ORDER — GLIPIZIDE ER 10 MG PO TB24: ORAL_TABLET | ORAL | Status: DC

## 2015-12-04 NOTE — Progress Notes (Signed)
Patient ID: Jonathan [name], male   DOB: 1941/05/03, 75 y.o.   MRN: [id number]  Jonathan [name] is a 75 y.o.-year-old male, returning for follow up management of DM2, dx 1990, uncontrolled, insulin-dep since ~4034, without complications (h/o stroke). Last visit 3 mo ago.  Last hemoglobin A1c was: Lab Results  Component Value Date   HGBA1C 7.4 12/04/2015   HGBA1C 6.8 09/06/2015   HGBA1C 7.0 06/07/2015   He is on: - Metformin 1000 mg am and 1000 mg pm - Glipizide XL 10 mg daily - Lantus 40 >> 36 >> 30 units qhs Humalog 4 units qac TID, if FS>150 ( hasn't used it recently, in >3 months)  Pt checks his sugars 3 a day >> By log book they are - worse at bedtime: - am: 82-135 >> 66, 87-123 >> 69, 81-121 >> 60, 67, 78-125 - 2h after b'fast: n/c - lunch: 104-221 >> 92, 100-159, 228, 259 (after fish) >> 67, 73-159 >> 79-153, 214 (snack) - 2h after lunch: n/c - dinner: n/c >> 110-141 - 2h after dinner: 89-200 >> 113-215 >> 109-175, 190, 217 >> 161-236 - bedtime: n/c  Dietary habits- eats three times daily.  Exercise- cannot exercise, due to exercise, sob  Diabetes Complications-  Stage 2  CKD, last BUN/creatinine: Lab Results  Component Value Date   BUN 14 03/06/2015   CREATININE 1.01 03/06/2015   Lab Results  Component Value Date   GFR 76.60 03/06/2015   MICRALBCREAT 12.7 04/28/2014   Allergic to ACE-I and ARB. Retinopathy- No, Last eye exam was in 2016 Neuropathy- no numbness and tingling in feet. No known neuropathy.  Associated history - No CAD . No hypothyroidism. his last TSH was  Lab Results  Component Value Date   TSH 0.447 11/28/2013   Has HTG: Lab Results  Component Value Date   CHOL 158 03/06/2015   HDL 47.00 03/06/2015   LDLCALC 58 09/01/2014   LDLDIRECT 81.0 03/06/2015   TRIG 305.0* 03/06/2015   CHOLHDL 3 03/06/2015  Currently on Lipitor 10. Tolerating well.    Smokes 1 PPD.  * Around early 2015, he was hospitalized after syncope, was in ICU, was on  assisted ventilation, expressive aphasia recovered- believed to have a stroke, now better.    ROS: Constitutional: no weight gain/loss, no fatigue, no subjective hyperthermia/hypothermia, + nocturia Eyes: no blurry vision, no xerophthalmia ENT: no sore throat, no nodules palpated in throat, no dysphagia/odynophagia, no hoarseness Cardiovascular: no CP/+ SOB/no palpitations/leg swelling Respiratory: + cough/+ SOB/+ wheezing Gastrointestinal: no N/V/D/C Musculoskeletal: no muscle/no joint aches Skin: no rashes Neurological: no tremors/numbness/tingling/dizziness + diff with erections  I reviewed pt's medications, allergies, PMH, social hx, family hx, and changes were documented in the history of present illness. Otherwise, unchanged from my initial visit note.  Past Medical History  Diagnosis Date  . HTN (hypertension)   . HLD (hyperlipidemia)   . Pulmonary nodule, right     lower lobe  . COPD (chronic obstructive pulmonary disease) ([place])   . Rotator cuff injury     right  . Frozen shoulder   . Shoulder pain   . Hyperkalemia   . Dermatitis seborrheica   . Other diseases of lung, not elsewhere classified   . Kidney stone   . Allergic rhinitis   . ED (erectile dysfunction)   . Diabetes mellitus type II   . Asthma   . Tobacco abuse   . Stroke [facility])     pt states "last year"   Past  Surgical History  Procedure Laterality Date  . Cervical discectomy  1998  . Ett  11/1994  . Tm repair    . Tee without cardioversion  12/03/2011    Procedure: TRANSESOPHAGEAL ECHOCARDIOGRAM (TEE);  Surgeon: Lelon Perla, MD;  Location: [facility] ENDOSCOPY;  Service: Cardiovascular;  Laterality: N/A;  . Eeg     Social History   Social History  . Marital Status: Married    Spouse Name: N/A  . Number of Children: N/A  . Years of Education: N/A   Occupational History  . Retired     Air cabin crew   Social History Main Topics  . Smoking status: Current Some Day Smoker -- 0.20 packs/day     Types: Cigarettes  . Smokeless tobacco: Never Used     Comment: prev smoked 1 1/2 PPD. Currently 2 cigs daily  . Alcohol Use: No     Comment: Quit 40 years ago  . Drug Use: No  . Sexual Activity: No   Other Topics Concern  . Not on file   Social History Narrative   Current Outpatient Prescriptions on File Prior to Visit  Medication Sig Dispense Refill  . ACCU-CHEK AVIVA PLUS test strip Check sugars 3-4 times daily. Dx E11/65 200 each 5  . albuterol (PROVENTIL) (2.5 MG/3ML) 0.083% nebulizer solution Take 3 mLs (2.5 mg total) by nebulization 2 (two) times daily as needed for shortness of breath. 75 mL 0  . albuterol (VENTOLIN HFA) 108 (90 BASE) MCG/ACT inhaler Inhale 2 puffs into the lungs every 6 (six) hours as needed for wheezing. 1 Inhaler 11  . amLODipine (NORVASC) 5 MG tablet Take 1 tablet by mouth  daily 90 tablet 0  . aspirin 81 MG tablet Take 1 tablet (81 mg total) by mouth daily. 30 tablet 0  . atorvastatin (LIPITOR) 10 MG tablet Take 1 tablet by mouth  daily 90 tablet 0  . Blood Glucose Monitoring Suppl (ACCU-CHEK AVIVA PLUS) W/DEVICE KIT USE AS DIRECTED 1 kit 0  . glipiZIDE (GLUCOTROL XL) 10 MG 24 hr tablet Take 1 tablet by mouth  daily 90 tablet 0  . insulin glargine (LANTUS) 100 unit/mL SOPN Inject 30 Units into the skin daily at 8 pm.    . insulin lispro (HUMALOG) 100 UNIT/ML KiwkPen Inject 5 Units into the skin 3 (three) times daily with meals. For cbg >=150    . Insulin Pen Needle (CAREFINE PEN NEEDLES) 32G X 6 MM MISC Use to inject insulin at bedtime. 90 each 2  . lamoTRIgine (LAMICTAL) 100 MG tablet Take 1 tablet (100 mg total) by mouth 2 (two) times daily. 180 tablet 3  . metFORMIN (GLUCOPHAGE) 1000 MG tablet TAKE 1 TABLET BY MOUTH IN  THE MORNING AND 1 TABLET  IN THE EVENING (Patient taking differently: Take 1,000 mg by mouth 2 (two) times daily with a meal. TAKE 1 TABLET BY MOUTH IN  THE MORNING AND 1 TABLET  IN THE EVENING) 180 tablet 1  . valACYclovir (VALTREX) 1000  MG tablet TAKE 1 TABLET BY MOUTH THREE TIMES A DAY 21 tablet 0   No current facility-administered medications on file prior to visit.   Allergies  Allergen Reactions  . Amoxicillin-Pot Clavulanate Other (See Comments)    Severe stomach pain.   . Quinapril Hcl     REACTION: back pain  . Valsartan     REACTION: back pain  . Varenicline Tartrate     REACTION: AMS, hallucinations, night mares   Family History  Problem Relation  Age of Onset  . Asthma Sister   . Emphysema Sister   . Diabetes Mother   . Diabetes Brother   . Heart disease Brother    PE: BP 124/78 mmHg  Pulse 91  Temp(Src) 97.5 F (36.4 C) (Oral)  Resp 12  Wt 178 lb (80.74 kg)  SpO2 97% Body mass index is 27.87 kg/(m^2). Wt Readings from Last 3 Encounters:  12/04/15 178 lb (80.74 kg)  09/06/15 185 lb (83.915 kg)  06/07/15 183 lb 9.6 oz (83.28 kg)   Constitutional: overweight, in NAD Eyes: PERRLA, EOMI, no exophthalmos ENT: moist mucous membranes, no thyromegaly, no cervical lymphadenopathy Cardiovascular: RRR, No MRG Respiratory: CTA B, no wheezes Gastrointestinal: abdomen soft, NT, ND, BS+ Musculoskeletal: no deformities, strength intact in all 4 Skin: moist, warm, no rashes Neurological: no tremor with outstretched hands, DTR normal in all 4  ASSESSMENT: 1. DM2, insulin-dependent, uncontrolled, without complications  PLAN:  1. Patient with long-standing, uncontrolled diabetes, on oral antidiabetic regimen + basal insulin, with worse control after dinner >> add a 5 mg Glipizide before dinner. Will also decrease Lantus since has some lown in am. - I suggested to:  Patient Instructions  Please continue: - Metformin 1000 mg am and 1000 mg pm - Glipizide XL 10 mg daily  Please decrease: - Lantus to 25 units at bedtime  Please add a 1/2 tablet Glipizide ER (5 mg) 15 min before dinner.  Please return in 3 months with your sugar log.  - conttinue checking sugars at different times of the day - check 3  times a day, rotating checks - advised for yearly eye exams >> he is UTD - advised him to try to quit smoking >> he is trying to cut down - checked Hba1c today >> 7.4% (worse) - Return to clinic in 3 mo with sugar log

## 2015-12-04 NOTE — Patient Instructions (Addendum)
Patient Instructions  Please continue: - Metformin 1000 mg am and 1000 mg pm - Glipizide XL 10 mg daily  Please decrease: - Lantus to 25 units at bedtime  Please add a 1/2 tablet Glipizide ER (5 mg) 15 min before dinner.  Please return in 3 months with your sugar log.

## 2015-12-10 ENCOUNTER — Other Ambulatory Visit: Payer: Self-pay | Admitting: Family Medicine

## 2015-12-10 ENCOUNTER — Other Ambulatory Visit: Payer: Self-pay | Admitting: Neurology

## 2015-12-10 DIAGNOSIS — R569 Unspecified convulsions: Secondary | ICD-10-CM

## 2015-12-10 DIAGNOSIS — G40209 Localization-related (focal) (partial) symptomatic epilepsy and epileptic syndromes with complex partial seizures, not intractable, without status epilepticus: Secondary | ICD-10-CM

## 2015-12-10 MED ORDER — LAMOTRIGINE 100 MG PO TABS: ORAL_TABLET | ORAL | Status: DC

## 2015-12-10 NOTE — Telephone Encounter (Signed)
Rx sent to local pharmacy.  

## 2015-12-12 ENCOUNTER — Ambulatory Visit (INDEPENDENT_AMBULATORY_CARE_PROVIDER_SITE_OTHER): Payer: Medicare Other | Admitting: Family Medicine

## 2015-12-12 ENCOUNTER — Encounter: Payer: Self-pay | Admitting: Family Medicine

## 2015-12-12 VITALS — BP 130/68 | HR 111 | Temp 97.5°F | Ht 67.0 in | Wt 179.0 lb

## 2015-12-12 DIAGNOSIS — Z720 Tobacco use: Secondary | ICD-10-CM | POA: Diagnosis not present

## 2015-12-12 DIAGNOSIS — J449 Chronic obstructive pulmonary disease, unspecified: Secondary | ICD-10-CM

## 2015-12-12 DIAGNOSIS — I10 Essential (primary) hypertension: Secondary | ICD-10-CM

## 2015-12-12 DIAGNOSIS — E785 Hyperlipidemia, unspecified: Secondary | ICD-10-CM | POA: Diagnosis not present

## 2015-12-12 DIAGNOSIS — J9611 Chronic respiratory failure with hypoxia: Secondary | ICD-10-CM

## 2015-12-12 DIAGNOSIS — Z9119 Patient's noncompliance with other medical treatment and regimen: Secondary | ICD-10-CM

## 2015-12-12 DIAGNOSIS — R569 Unspecified convulsions: Secondary | ICD-10-CM

## 2015-12-12 DIAGNOSIS — Z91199 Patient's noncompliance with other medical treatment and regimen due to unspecified reason: Secondary | ICD-10-CM

## 2015-12-12 MED ORDER — ATORVASTATIN CALCIUM 10 MG PO TABS: ORAL_TABLET | ORAL | Status: DC

## 2015-12-12 MED ORDER — AMLODIPINE BESYLATE 5 MG PO TABS: ORAL_TABLET | ORAL | Status: DC

## 2015-12-12 NOTE — Progress Notes (Signed)
Subjective:    Patient ID: Jonathan [name], male    DOB: [birth year], 75 y.o.   MRN: [id number]  HPI Here for f/u of chronic health problems  Doing ok overall  Feeling fine   Getting over a cold with sinus congestion - allergies are bad with congestion  Some pnd   Wt is up 1 lb from last visit but down 11 lb this year bmi is 38 He has been working on it - eating smaller portions to control his diabetes   Sees Dr Cruzita Lederer for DM2 Lab Results  Component Value Date   HGBA1C 7.4 12/04/2015  6.8 before that  Had a lot of stress in Feb- getting better now  Has been able to cut back on his lantus -happy with that and also cut glipizide and took mid day metformin away    bp is stable today  No cp or palpitations or headaches or edema  No side effects to medicines  BP Readings from Last 3 Encounters:  12/12/15 130/68  12/04/15 124/78  09/06/15 122/64     Smoking status- still low -about 1/2 ppd (8-10) Has quit a few times and cannot stay off them  Breathing is about the same  chantix did not work for him  Has never tried nicotine replacement  Has COPD  Not ready to quit yet  Has 02 at the house   Hyperlipidemia Lab Results  Component Value Date   CHOL 158 03/06/2015   HDL 47.00 03/06/2015   LDLCALC 58 09/01/2014   LDLDIRECT 81.0 03/06/2015   TRIG 305.0* 03/06/2015   CHOLHDL 3 03/06/2015    Hx of seizure disorder No more seizures  Is due for neurology visit  He wants to make his own appointment     Chemistry      Component Value Date/Time   NA 138 03/06/2015 1416   K 4.5 03/06/2015 1416   CL 98 03/06/2015 1416   CO2 27 03/06/2015 1416   BUN 14 03/06/2015 1416   CREATININE 1.01 03/06/2015 1416      Component Value Date/Time   CALCIUM 9.6 03/06/2015 1416   ALKPHOS 75 03/06/2015 1416   AST 19 03/06/2015 1416   ALT 19 03/06/2015 1416   BILITOT 0.9 03/06/2015 1416      Pt declines physicals and health mt  Declines immunizations -thinks a flu shot killed  his father  Declines cancer screening  Disc the lung cancer screening program because he is high risk-declines that   Patient Active Problem List   Diagnosis Date Noted  . Type 2 diabetes mellitus with hyperglycemia, with long-term current use of insulin ([place]) 12/04/2015  . Type 2 diabetes mellitus with hyperglycemia ([place]) 06/07/2015  . Shingles 09/08/2014  . Medically noncompliant 05/05/2014  . UTI (urinary tract infection) 02/21/2014  . Leukocytosis 02/03/2014  . Seizures ([place]) 12/26/2013  . Diabetes type 2, uncontrolled ([place]) 12/20/2013  . Chronic respiratory failure ([place]) 12/15/2013  . Aspiration pneumonia ([place]) 12/15/2013  . Expressive aphasia 12/14/2013  . Syncope 11/28/2013  . Acute encephalopathy 11/28/2013  . Respiratory failure ([place]) 11/28/2013  . Immunization not carried out because of patient refusal 07/05/2012  . HLD (hyperlipidemia)   . Pulmonary nodule, right   . Allergic rhinitis   . ED (erectile dysfunction)   . Asthma   . Tobacco abuse   . COPD (chronic obstructive pulmonary disease) ([place]) 03/29/2010  . Essential hypertension 01/05/2007   Past Medical History  Diagnosis Date  . HTN (hypertension)   .  HLD (hyperlipidemia)   . Pulmonary nodule, right     lower lobe  . COPD (chronic obstructive pulmonary disease) ([place])   . Rotator cuff injury     right  . Frozen shoulder   . Shoulder pain   . Hyperkalemia   . Dermatitis seborrheica   . Other diseases of lung, not elsewhere classified   . Kidney stone   . Allergic rhinitis   . ED (erectile dysfunction)   . Diabetes mellitus type II   . Asthma   . Tobacco abuse   . Stroke [facility])     pt states "last year"   Past Surgical History  Procedure Laterality Date  . Cervical discectomy  1998  . Ett  11/1994  . Tm repair    . Tee without cardioversion  12/03/2011    Procedure: TRANSESOPHAGEAL ECHOCARDIOGRAM (TEE);  Surgeon: Lelon Perla, MD;  Location: [facility] ENDOSCOPY;  Service: Cardiovascular;  Laterality:  N/A;  . Eeg     Social History  Substance Use Topics  . Smoking status: Current Some Day Smoker -- 0.20 packs/day    Types: Cigarettes  . Smokeless tobacco: Never Used     Comment: prev smoked 1 1/2 PPD. Currently 2 cigs daily  . Alcohol Use: No     Comment: Quit 40 years ago   Family History  Problem Relation Age of Onset  . Asthma Sister   . Emphysema Sister   . Diabetes Mother   . Diabetes Brother   . Heart disease Brother    Allergies  Allergen Reactions  . Amoxicillin-Pot Clavulanate Other (See Comments)    Severe stomach pain.   . Quinapril Hcl     REACTION: back pain  . Valsartan     REACTION: back pain  . Varenicline Tartrate     REACTION: AMS, hallucinations, night mares   Current Outpatient Prescriptions on File Prior to Visit  Medication Sig Dispense Refill  . ACCU-CHEK AVIVA PLUS test strip Check sugars 3-4 times daily. Dx E11/65 200 each 5  . albuterol (PROVENTIL) (2.5 MG/3ML) 0.083% nebulizer solution Take 3 mLs (2.5 mg total) by nebulization 2 (two) times daily as needed for shortness of breath. 75 mL 0  . albuterol (VENTOLIN HFA) 108 (90 BASE) MCG/ACT inhaler Inhale 2 puffs into the lungs every 6 (six) hours as needed for wheezing. 1 Inhaler 11  . amLODipine (NORVASC) 5 MG tablet Take 1 tablet by mouth  daily 90 tablet 0  . aspirin 81 MG tablet Take 1 tablet (81 mg total) by mouth daily. 30 tablet 0  . atorvastatin (LIPITOR) 10 MG tablet Take 1 tablet by mouth  daily 90 tablet 0  . Blood Glucose Monitoring Suppl (ACCU-CHEK AVIVA PLUS) W/DEVICE KIT USE AS DIRECTED 1 kit 0  . glipiZIDE (GLUCOTROL XL) 10 MG 24 hr tablet Take 1 tablet by mouth  Daily and half a tablet before dinner 135 tablet 1  . insulin glargine (LANTUS) 100 unit/mL SOPN Inject 0.25 mLs (25 Units total) into the skin daily at 8 pm. 15 mL 2  . Insulin Pen Needle (CAREFINE PEN NEEDLES) 32G X 6 MM MISC Use to inject insulin at bedtime. 90 each 2  . lamoTRIgine (LAMICTAL) 100 MG tablet Take 2  tablets once a day 60 tablet 0  . metFORMIN (GLUCOPHAGE) 1000 MG tablet TAKE 1 TABLET BY MOUTH IN  THE MORNING AND 1 TABLET  IN THE EVENING (Patient taking differently: Take 1,000 mg by mouth 2 (two) times daily with a  meal. TAKE 1 TABLET BY MOUTH IN  THE MORNING AND 1 TABLET  IN THE EVENING) 180 tablet 1  . valACYclovir (VALTREX) 1000 MG tablet TAKE 1 TABLET BY MOUTH THREE TIMES A DAY 21 tablet 0   No current facility-administered medications on file prior to visit.    Review of Systems    Review of Systems  Constitutional: Negative for fever, appetite change,  and unexpected weight change.  Eyes: Negative for pain and visual disturbance.  ENT pos for rhinorrhea and pnd /occ nasal congestion  Respiratory: Negative for cough and pos for baseline  shortness of breath.   Cardiovascular: Negative for cp or palpitations    Gastrointestinal: Negative for nausea, diarrhea and constipation.  Genitourinary: Negative for urgency and frequency.  Skin: Negative for pallor or rash   Neurological: Negative for weakness, light-headedness, numbness and headaches. neg for seizures  Hematological: Negative for adenopathy. Does not bruise/bleed easily.  Psychiatric/Behavioral: Negative for dysphoric mood. The patient is not nervous/anxious.       Objective:   Physical Exam  Constitutional: He appears well-developed and well-nourished. No distress.  Frail appearing and overwt (in abdomen)  HENT:  Head: Normocephalic and atraumatic.  Mouth/Throat: Oropharynx is clear and moist. No oropharyngeal exudate.  Clear pnd  Eyes: Conjunctivae and EOM are normal. Pupils are equal, round, and reactive to light.  Neck: Normal range of motion. Neck supple. No JVD present. Carotid bruit is not present. No thyromegaly present.  Cardiovascular: Normal rate, regular rhythm, normal heart sounds and intact distal pulses.  Exam reveals no gallop.   Pulmonary/Chest: Effort normal and breath sounds normal. No respiratory  distress. He has no wheezes. He has no rales.  No crackles  Diffusely distant bs Mildly prolonged exp phase No wheezing today  occ junky sounding cough  Abdominal: Soft. Bowel sounds are normal. He exhibits no distension, no abdominal bruit and no mass. There is no tenderness.  Musculoskeletal: He exhibits no edema.  Walks with cane  Lymphadenopathy:    He has no cervical adenopathy.  Neurological: He is alert. He has normal reflexes. No cranial nerve deficit. He exhibits normal muscle tone. Coordination normal.  Skin: Skin is warm and dry. No rash noted. No pallor.  Psychiatric: He has a normal mood and affect.  Slightly irritable today          Assessment & Plan:   Problem List Items Addressed This Visit      Cardiovascular and Mediastinum   Essential hypertension - Primary    bp in fair control at this time  BP Readings from Last 1 Encounters:  12/12/15 130/68   No changes needed Disc lifstyle change with low sodium diet and exercise  Labs today Enc further wt loss and smoking cessation       Relevant Medications   atorvastatin (LIPITOR) 10 MG tablet   amLODipine (NORVASC) 5 MG tablet   Other Relevant Orders   CBC with Differential/Platelet   Comprehensive metabolic panel   TSH   Lipid panel     Respiratory   Chronic respiratory failure ([place])    With copd Due for pulm f/u He will make his own appt      COPD (chronic obstructive pulmonary disease) ([place])    Due for pulmonary f/u -he will schedule that appt Sob limits his exercise  Exam is unchanged-but did improve when he prev cut back on smoking Unsure if he will ever totally quit -disc things to help Did not wear his 02 to  the office today and pulse ox is 92%        Other   HLD (hyperlipidemia)    Lipid panel today  Diet is fair  Continues lipitor Disc goals for HDL and LDL      Relevant Medications   atorvastatin (LIPITOR) 10 MG tablet   amLODipine (NORVASC) 5 MG tablet   Other Relevant  Orders   Lipid panel   Medically noncompliant    Declines imms and cancer screening Declines the lung cancer screening program      Seizures ([place])    Currently on lamictal with no seizures in the past year  Due for pulmonary f/u - he wants to make his won appointment       Tobacco abuse    Disc in detail risks of smoking and possible outcomes including copd, vascular/ heart disease, cancer , respiratory and sinus infections  Pt voices understanding Has cut down but states he is not ready to quit at this time  Has known COPD

## 2015-12-12 NOTE — Patient Instructions (Signed)
Labs today  Get some flonase nasal spay over the counter - use it once daily through allergy season  Keep thinking about about quitting smoking ! Follow up with neurology and pulmonology - you can make your own appointments - let us know if you need help with that  Try to stick to a diabetic diet  If you change your mind about immunizations  Blood pressure is stable

## 2015-12-12 NOTE — Progress Notes (Signed)
Pre visit review using our clinic review tool, if applicable. No additional management support is needed unless otherwise documented below in the visit note. 

## 2015-12-13 LAB — COMPREHENSIVE METABOLIC PANEL
ALT: 11 U/L (ref 0–53)
AST: 18 U/L (ref 0–37)
Albumin: 4.3 g/dL (ref 3.5–5.2)
Alkaline Phosphatase: 84 U/L (ref 39–117)
BUN: 19 mg/dL (ref 6–23)
CO2: 29 mEq/L (ref 19–32)
Calcium: 9.9 mg/dL (ref 8.4–10.5)
Chloride: 99 mEq/L (ref 96–112)
Creatinine, Ser: 1.24 mg/dL (ref 0.40–1.50)
GFR: 60.33 mL/min (ref >60.00)
Glucose, Bld: 212 mg/dL — ABNORMAL HIGH (ref 70–99)
Potassium: 4.8 mEq/L (ref 3.5–5.1)
Sodium: 138 mEq/L (ref 135–145)
Total Bilirubin: 0.6 mg/dL (ref 0.2–1.2)
Total Protein: 7.9 g/dL (ref 6.0–8.3)

## 2015-12-13 LAB — CBC WITH DIFFERENTIAL/PLATELET
Basophils Absolute: 0 10*3/uL (ref 0.0–0.1)
Basophils Relative: 0.1 % (ref 0.0–3.0)
Eosinophils Absolute: 0.2 10*3/uL (ref 0.0–0.7)
Eosinophils Relative: 2.1 % (ref 0.0–5.0)
HCT: 50 % (ref 39.0–52.0)
Hemoglobin: 16.6 g/dL (ref 13.0–17.0)
Lymphocytes Relative: 20.3 % (ref 12.0–46.0)
Lymphs Abs: 2.3 10*3/uL (ref 0.7–4.0)
MCHC: 33.2 g/dL (ref 30.0–36.0)
MCV: 84.5 fl (ref 78.0–100.0)
Monocytes Absolute: 0.8 10*3/uL (ref 0.1–1.0)
Monocytes Relative: 7.4 % (ref 3.0–12.0)
Neutro Abs: 8.1 10*3/uL — ABNORMAL HIGH (ref 1.4–7.7)
Neutrophils Relative %: 70.1 % (ref 43.0–77.0)
Platelets: 342 10*3/uL (ref 150.0–400.0)
RBC: 5.91 Mil/uL — ABNORMAL HIGH (ref 4.22–5.81)
RDW: 14.9 % (ref 11.5–15.5)
WBC: 11.5 10*3/uL — ABNORMAL HIGH (ref 4.0–10.5)

## 2015-12-13 LAB — LIPID PANEL
Cholesterol: 134 mg/dL (ref 0–200)
HDL: 33.7 mg/dL — ABNORMAL LOW (ref >39.00)
NonHDL: 99.86
Total CHOL/HDL Ratio: 4
Triglycerides: 239 mg/dL — ABNORMAL HIGH (ref 0.0–149.0)
VLDL: 47.8 mg/dL — ABNORMAL HIGH (ref 0.0–40.0)

## 2015-12-13 LAB — TSH: TSH: 3.58 u[IU]/mL (ref 0.35–4.50)

## 2015-12-13 LAB — LDL CHOLESTEROL, DIRECT: Direct LDL: 75 mg/dL

## 2015-12-13 NOTE — Assessment & Plan Note (Signed)
Disc in detail risks of smoking and possible outcomes including copd, vascular/ heart disease, cancer , respiratory and sinus infections  Pt voices understanding Has cut down but states he is not ready to quit at this time  Has known COPD

## 2015-12-13 NOTE — Assessment & Plan Note (Signed)
Lipid panel today  Diet is fair  Continues lipitor Disc goals for HDL and LDL

## 2015-12-13 NOTE — Assessment & Plan Note (Signed)
With copd Due for pulm f/u He will make his own appt

## 2015-12-13 NOTE — Assessment & Plan Note (Signed)
Declines imms and cancer screening Declines the lung cancer screening program

## 2015-12-13 NOTE — Assessment & Plan Note (Signed)
Currently on lamictal with no seizures in the past year  Due for pulmonary f/u - he wants to make his won appointment

## 2015-12-13 NOTE — Assessment & Plan Note (Addendum)
Due for pulmonary f/u -he will schedule that appt Sob limits his exercise  Exam is unchanged-but did improve when he prev cut back on smoking Unsure if he will ever totally quit -disc things to help Did not wear his 02 to the office today and pulse ox is 92%

## 2015-12-13 NOTE — Assessment & Plan Note (Signed)
bp in fair control at this time  BP Readings from Last 1 Encounters:  12/12/15 130/68   No changes needed Disc lifstyle change with low sodium diet and exercise  Labs today Enc further wt loss and smoking cessation

## 2015-12-14 ENCOUNTER — Encounter: Payer: Self-pay | Admitting: *Deleted

## 2015-12-31 ENCOUNTER — Other Ambulatory Visit: Payer: Self-pay | Admitting: Internal Medicine

## 2016-02-19 ENCOUNTER — Other Ambulatory Visit: Payer: Self-pay | Admitting: Family Medicine

## 2016-02-19 NOTE — Telephone Encounter (Signed)
Rout to Dr. Cruzita Lederer since she is managing pt's DM

## 2016-03-04 ENCOUNTER — Ambulatory Visit: Payer: Medicare Other | Admitting: Internal Medicine

## 2016-03-17 ENCOUNTER — Telehealth: Payer: Self-pay | Admitting: Neurology

## 2016-03-17 MED ORDER — LAMOTRIGINE 100 MG PO TABS: ORAL_TABLET | ORAL | Status: DC

## 2016-03-17 NOTE — Telephone Encounter (Signed)
PT called and has a question about his seizure medication/Dawn CB# [phone]

## 2016-03-17 NOTE — Telephone Encounter (Signed)
Only enough until that appt and if he doesn't show or r/s to later date, will not be able to refill.  I reviewed PCP notes from 11/2015 and he wouldn't let her make f/u then and said that he would make it.

## 2016-03-17 NOTE — Telephone Encounter (Signed)
Last seen 12/01/14. Pt has no follow up on file. Did call and leave a message for him to return call. Assume it is about refill being denied.

## 2016-03-17 NOTE — Telephone Encounter (Signed)
RX sent in with 1 refill to last until 05/06/16

## 2016-03-17 NOTE — Telephone Encounter (Signed)
Spoke to caregiver, Dorisann Frames, after pt answered phone and told me to speak to her. Pt needs refill on his Lamictal 100 mg BID. Pt was last seen 12/01/14. Pt has no f/u on file. Did schedule him to see Dr. Delice Lesch 05/06/16. Please advise on refill request. Pt was advised he may need to request refill from PCP until re-established.

## 2016-03-18 IMAGING — MR MR HEAD W/O CM
10 of 13 series · 26 of 48 positions shown · non-contrast
Comparison: 12/14/2013. 11/29/2013 , MRI 12/01/2011

CLINICAL DATA: Stroke

EXAM:
MRI HEAD WITHOUT CONTRAST
MRA HEAD WITHOUT CONTRAST
TECHNIQUE: Multiplanar, multiecho pulse sequences of the brain and surrounding
structures were obtained without intravenous contrast. Angiographic
images of the head were obtained using MRA technique without
contrast.

[Series 2: ax (id) (id) · axial · 1.6mm · 0.43mm/px · z∈[-88,+29]mm · 8 of 148 slices shown]
[im 1/148]
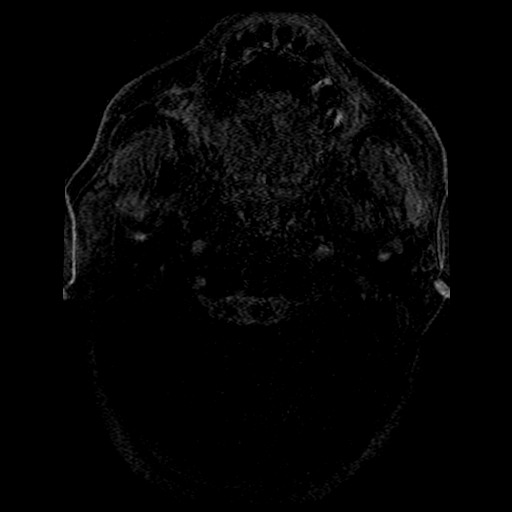
[im 19/148]
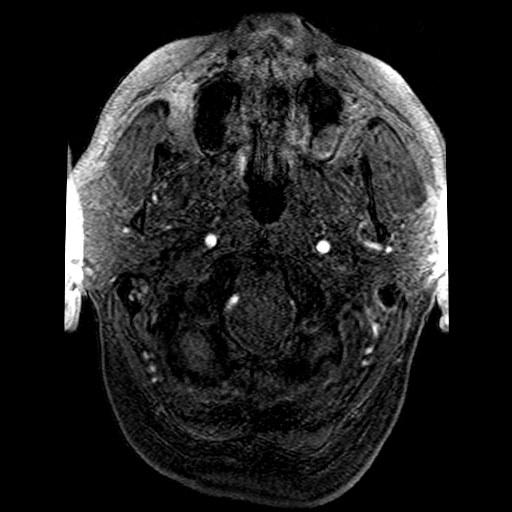
[im 37/148]
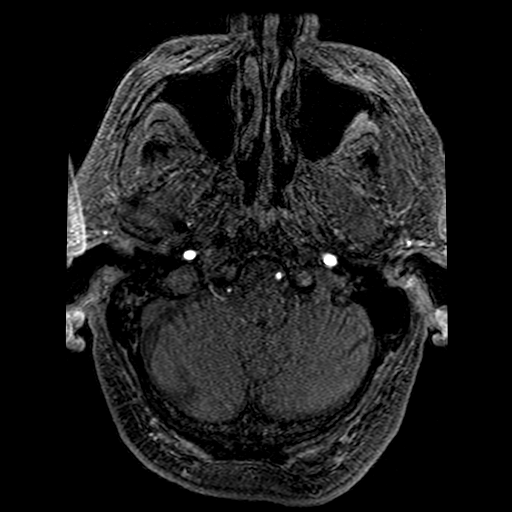
[im 56/148]
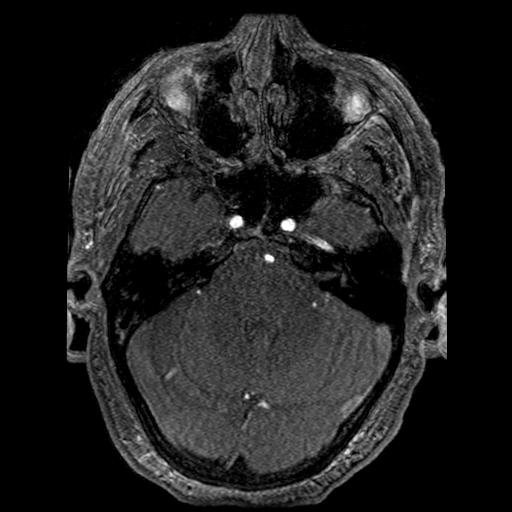
[im 92/148]
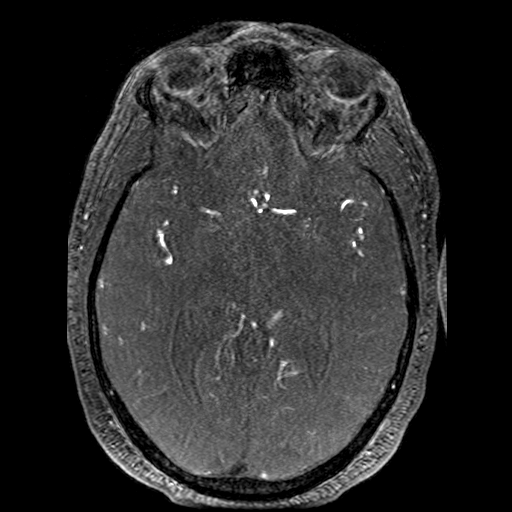
[im 111/148]
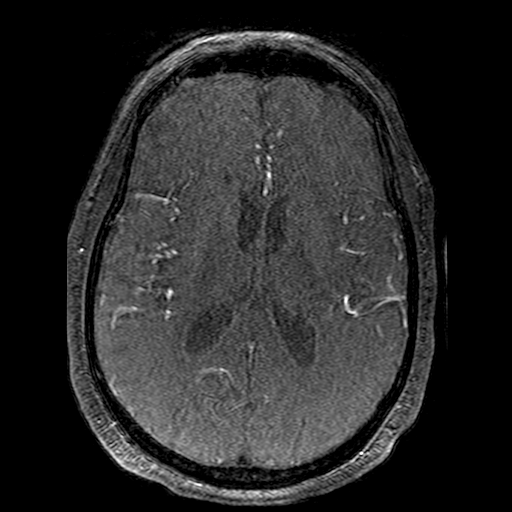
[im 129/148]
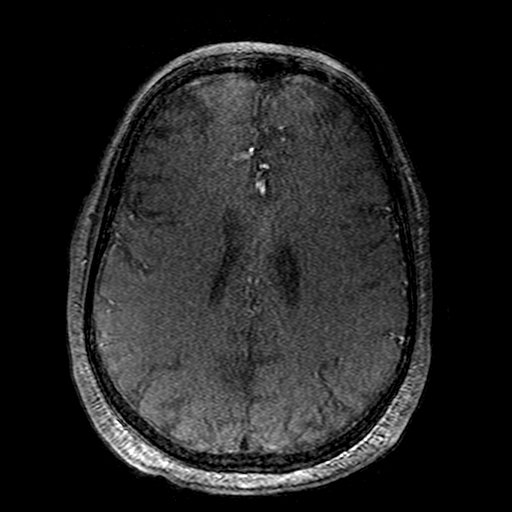
[im 148/148]
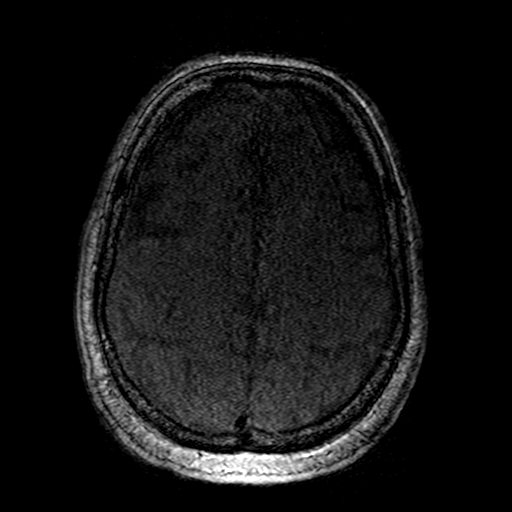

[Series 4: DWI · axial · 5.0mm · 1.09mm/px · z∈[-85,+64]mm · 4 of 61 slices shown (1 of 4)]
[im 1/61]
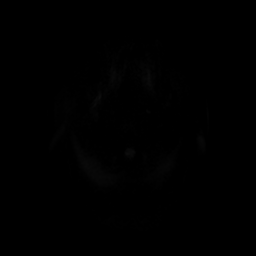
[im 21/61]
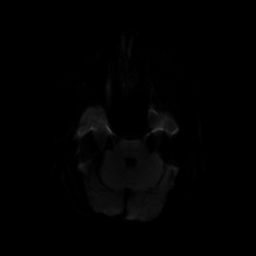
[im 41/61]
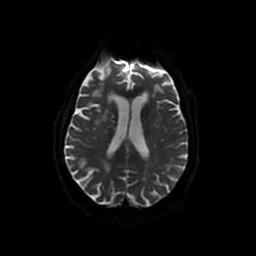
[im 61/61]
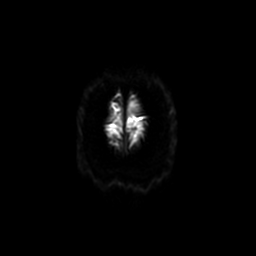

[Series 5: DWI · coronal · 5.0mm · 1.09mm/px · 4 of 74 slices shown (2 of 4)]
[im 1/74]
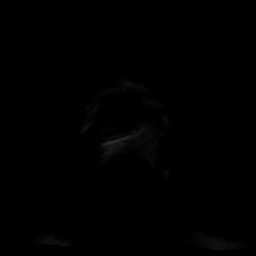
[im 25/74]
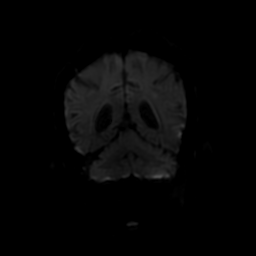
[im 49/74]
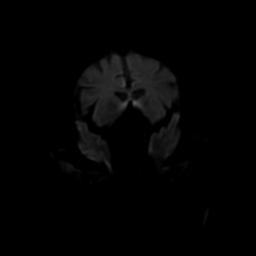
[im 74/74]
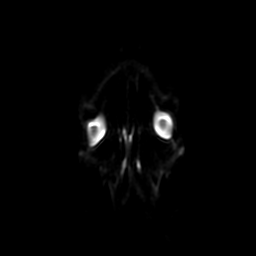

[Series 6: FLAIR · sagittal · 5.0mm · 0.47mm/px · 1 of 24 slices shown (1 of 3)]
[im 1/24]
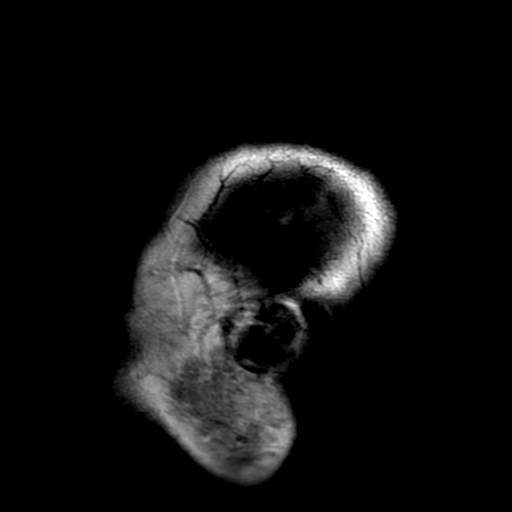

[Series 7: T2 · axial · 5.0mm · 0.43mm/px · 1 of 24 slices shown (1 of 2)]
[im 1/24]
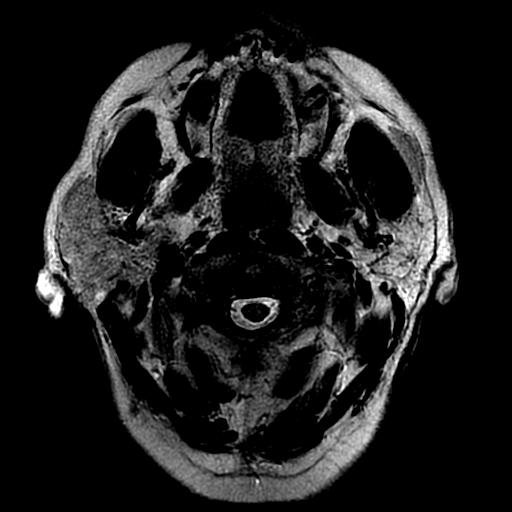

[Series 8: FLAIR · axial · 5.0mm · 0.43mm/px · 1 of 24 slices shown (2 of 3)]
[im 1/24]
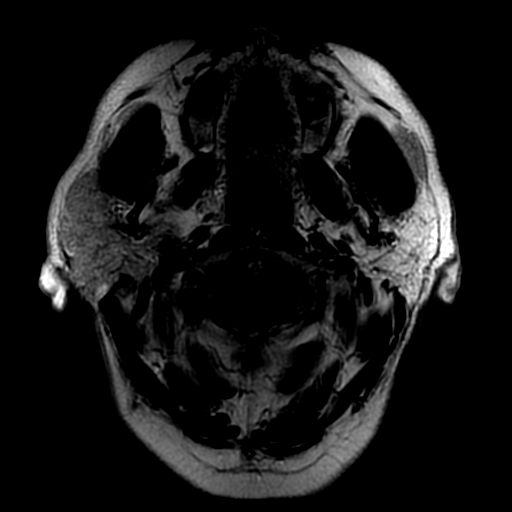

[Series 9: FLAIR · axial · 5.0mm · 0.43mm/px · 1 of 24 slices shown (3 of 3)]
[im 1/24]
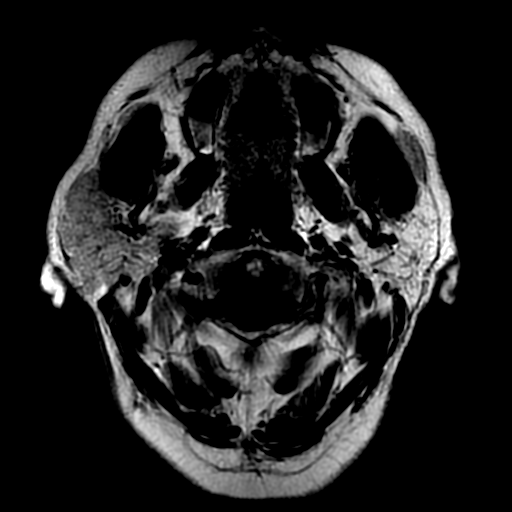

[Series 11: T2 · coronal · 5.0mm · 0.47mm/px · 2 of 31 slices shown (2 of 2)]
[im 1/31]
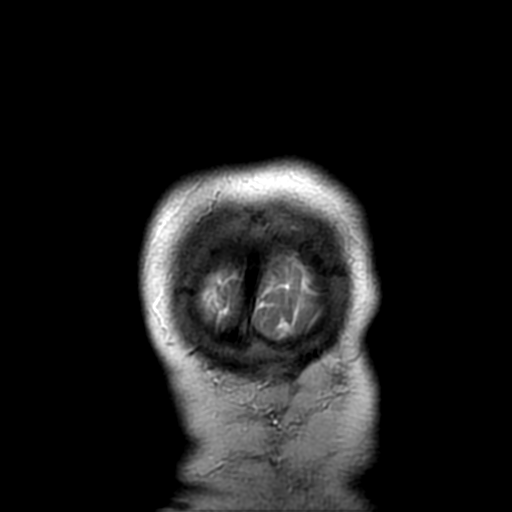
[im 31/31]
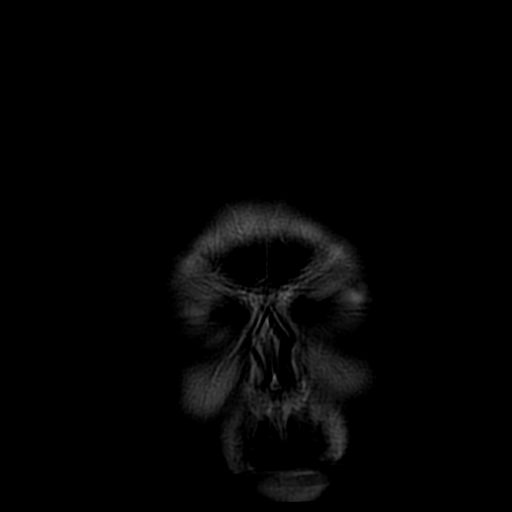

[Series 400: DWI · axial · 5.0mm · 1.09mm/px · z∈[-85,+64]mm · 2 of 31 slices shown (3 of 4)]
[im 1/31]
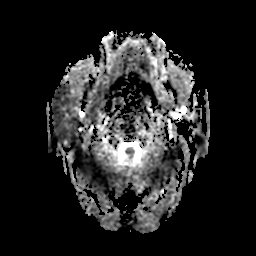
[im 31/31]
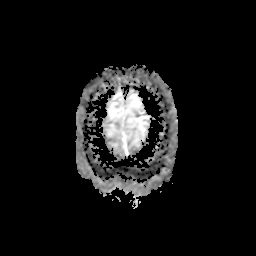

[Series 500: DWI · coronal · 5.0mm · 1.09mm/px · 2 of 37 slices shown (4 of 4)]
[im 1/37]
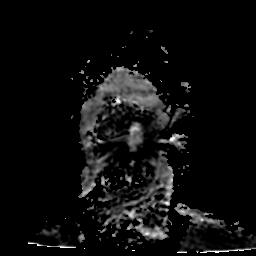
[im 37/37]
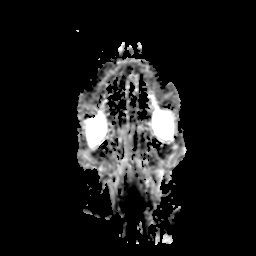

[26 of 48 positions shown; findings below may reference images not displayed]

FINDINGS: MRI HEAD FINDINGS

There is mild motion degradation. Diffusion imaging does not show
any acute or subacute infarction. The brainstem is normal. There is
cerebellar atrophy. The cerebral hemispheres show old cortical and
subcortical ischemic change in the right occipital lobe. Extensive
areas of abnormal signal throughout the hemispheric white matter.
These are consistent with old small vessel insults. Mild chronic
small-vessel changes noted in the thalami and basal ganglia. No
large vessel territory infarction. No mass lesion, hemorrhage,
hydrocephalus or extra-axial collection. Paranasal sinuses do not
show any significant inflammation. There is fluid throughout the
right mastoid air cells.

MRA HEAD FINDINGS

Both internal carotid arteries are patent into the brain. There is
atherosclerotic irregularity and narrowing in both carotid siphon
regions. The left internal carotid artery supplies the left middle
cerebral artery and both anterior cerebral arteries. The right
anterior cerebral artery is severely diseased and does not show
demonstrable flow on today's examination. On the previous study November 2011, there was minimal flow a in this vessel. Both posterior
cerebral arteries take a fetal origin from the anterior circulation.

Right vertebral artery is the dominant vessel widely patent to the
basilar. There is some atherosclerotic irregularity of the distal
right vertebral artery unchanged since the previous study. The left
vertebral artery is a small vessel with extensive atherosclerotic
change. This vessel all may give minimal supply to the basilar. No
basilar stenosis. Superior cerebellar and posterior cerebral
arteries are patent bilaterally. There is atherosclerotic
irregularity of the more distal branch vessels.
IMPRESSION: No acute infarction. Extensive chronic small vessel changes
throughout the brain. Small old right occipital cortical infarction.

Atherosclerotic narrowing and irregularity of both carotid siphon
regions. Severely diseased right A1 segment which does not
definitely show flow today. Both the anterior cerebral arteries are
appear well supplied from the left carotid system.

Severely diseased left vertebral artery. The right vertebral artery
is dominant and widely patent to the basilar. Posterior circulation
branch vessels are patent but show atherosclerotic irregularity.

## 2016-03-19 ENCOUNTER — Telehealth: Payer: Self-pay | Admitting: Neurology

## 2016-03-19 MED ORDER — LAMOTRIGINE 100 MG PO TABS: ORAL_TABLET | ORAL | Status: DC

## 2016-03-19 NOTE — Telephone Encounter (Signed)
1 week script for Lamictal sent to local pharmacy. Other Rx went to mail order and pt completely out.

## 2016-03-19 NOTE — Telephone Encounter (Signed)
VM-PT left message that he is out of seizure medication/Dawn CB# [phone] or [phone]

## 2016-04-14 ENCOUNTER — Other Ambulatory Visit: Payer: Self-pay | Admitting: Neurology

## 2016-04-29 ENCOUNTER — Encounter: Payer: Self-pay | Admitting: Internal Medicine

## 2016-04-29 ENCOUNTER — Ambulatory Visit (INDEPENDENT_AMBULATORY_CARE_PROVIDER_SITE_OTHER): Payer: Medicare Other | Admitting: Internal Medicine

## 2016-04-29 VITALS — BP 122/60 | HR 93 | Ht 67.0 in | Wt 187.0 lb

## 2016-04-29 DIAGNOSIS — E1165 Type 2 diabetes mellitus with hyperglycemia: Secondary | ICD-10-CM

## 2016-04-29 DIAGNOSIS — Z794 Long term (current) use of insulin: Secondary | ICD-10-CM | POA: Diagnosis not present

## 2016-04-29 LAB — POCT GLYCOSYLATED HEMOGLOBIN (HGB A1C): Hemoglobin A1C: 7.9

## 2016-04-29 NOTE — Progress Notes (Signed)
Patient ID: Jonathan [name], male   DOB: [birth year], 75 y.o.   MRN: [id number]  Jonathan [name] is a 75 y.o.-year-old male, returning for follow up management of DM2, dx 1990, uncontrolled, insulin-dep since ~75 84, without complications (h/o stroke). Last visit 3 mo ago.  Last hemoglobin A1c was: Lab Results  Component Value Date   HGBA1C 7.4 12/04/2015   HGBA1C 6.8 09/06/2015   HGBA1C 7.0 06/07/2015   He is on: - Glipizide XL 10 mg in am 1/2 tablet (5 mg) 15 min before dinner. - Metformin 1000 mg 2x a day  - Lantus 25 units at bedtime Humalog 4 units qac TID, if FS>150 ( hasn't used it recently, in >3 months)  Pt checks his sugars 3 a day >> By log book they are - worse at bedtime: - am: 82-135 >> 66, 87-123 >> 69, 81-121 >> 60, 67, 78-125 >> 84-111, 128 - 2h after b'fast: n/c - lunch: 104-221 >> 92, 100-159, 228, 259 (after fish) >> 67, 73-159 >> 79-153, 214 (snack) >> 98, 121-149, 157 - 2h after lunch: n/c - dinner: n/c >> 110-141 >> 81-128 - 2h after dinner: 89-200 >> 113-215 >> 109-175, 190, 217 >> 161-236 >> 155-239 (cake) - bedtime: n/c  Dietary habits- eats three times daily.  Exercise- cannot exercise, due to exercise, sob  Diabetes Complications-  Stage 2  CKD, last BUN/creatinine: Lab Results  Component Value Date   BUN 19 12/12/2015   CREATININE 1.24 12/12/2015   Lab Results  Component Value Date   GFR 60.33 12/12/2015   MICRALBCREAT 12.7 04/28/2014   Allergic to ACE-I and ARB. Retinopathy- No, Last eye exam was in 2016 Neuropathy- no numbness and tingling in feet. No known neuropathy.  Associated history - No CAD . No hypothyroidism. his last TSH was  Lab Results  Component Value Date   TSH 3.58 12/12/2015   Has HTG: Lab Results  Component Value Date   CHOL 134 12/12/2015   HDL 33.70 (L) 12/12/2015   LDLCALC 58 09/01/2014   LDLDIRECT 75.0 12/12/2015   TRIG 239.0 (H) 12/12/2015   CHOLHDL 4 12/12/2015  Currently on Lipitor 10. Tolerating well.     Smokes 1 PPD.  * Around early 2015, he was hospitalized after syncope, was in ICU, was on assisted ventilation, expressive aphasia recovered- believed to have a stroke, now better.    ROS: Constitutional: no weight gain/loss, no fatigue, no subjective hyperthermia/hypothermia, + nocturia Eyes: no blurry vision, no xerophthalmia ENT: no sore throat, no nodules palpated in throat, no dysphagia/odynophagia, no hoarseness Cardiovascular: no CP/+ SOB/no palpitations/leg swelling Respiratory: + cough/+ SOB/+ wheezing Gastrointestinal: no N/V/D/C Musculoskeletal: no muscle/no joint aches Skin: no rashes Neurological: no tremors/numbness/tingling/dizziness + diff with erections  I reviewed pt's medications, allergies, PMH, social hx, family hx, and changes were documented in the history of present illness. Otherwise, unchanged from my initial visit note.  Past Medical History:  Diagnosis Date  . Allergic rhinitis   . Asthma   . COPD (chronic obstructive pulmonary disease) ([place])   . Dermatitis seborrheica   . Diabetes mellitus type II   . ED (erectile dysfunction)   . Frozen shoulder   . HLD (hyperlipidemia)   . HTN (hypertension)   . Hyperkalemia   . Kidney stone   . Other diseases of lung, not elsewhere classified   . Pulmonary nodule, right    lower lobe  . Rotator cuff injury    right  . Shoulder pain   . Stroke (  [place])    pt states "last year"  . Tobacco abuse    Past Surgical History:  Procedure Laterality Date  . CERVICAL DISCECTOMY  1998  . EEG    . ETT  11/1994  . TEE WITHOUT CARDIOVERSION  12/03/2011   Procedure: TRANSESOPHAGEAL ECHOCARDIOGRAM (TEE);  Surgeon: Lelon Perla, MD;  Location: [facility] ENDOSCOPY;  Service: Cardiovascular;  Laterality: N/A;  . TM repair     Social History   Social History  . Marital status: Married    Spouse name: N/A  . Number of children: N/A  . Years of education: N/A   Occupational History  . Retired     Air cabin crew    Social History Main Topics  . Smoking status: Current Some Day Smoker    Packs/day: 0.20    Types: Cigarettes  . Smokeless tobacco: Never Used     Comment: prev smoked 1 1/2 PPD. Currently 2 cigs daily  . Alcohol use No     Comment: Quit 40 years ago  . Drug use: No  . Sexual activity: No   Other Topics Concern  . Not on file   Social History Narrative  . No narrative on file   Current Outpatient Prescriptions on File Prior to Visit  Medication Sig Dispense Refill  . ACCU-CHEK AVIVA PLUS test strip Check sugars 3-4 times daily. Dx E11/65 200 each 5  . albuterol (VENTOLIN HFA) 108 (90 BASE) MCG/ACT inhaler Inhale 2 puffs into the lungs every 6 (six) hours as needed for wheezing. 1 Inhaler 11  . amLODipine (NORVASC) 5 MG tablet Take 1 tablet by mouth  daily 90 tablet 3  . aspirin 81 MG tablet Take 1 tablet (81 mg total) by mouth daily. 30 tablet 0  . atorvastatin (LIPITOR) 10 MG tablet Take 1 tablet by mouth  daily 90 tablet 3  . Blood Glucose Monitoring Suppl (ACCU-CHEK AVIVA PLUS) W/DEVICE KIT USE AS DIRECTED 1 kit 0  . glipiZIDE (GLUCOTROL XL) 10 MG 24 hr tablet Take 1 tablet in the morning and half a tablet before dinner 135 tablet 1  . insulin glargine (LANTUS) 100 unit/mL SOPN Inject 0.25 mLs (25 Units total) into the skin daily at 8 pm. 15 mL 2  . Insulin Pen Needle (CAREFINE PEN NEEDLES) 32G X 6 MM MISC Use to inject insulin at bedtime. 90 each 2  . lamoTRIgine (LAMICTAL) 100 MG tablet Take 2 tablets by mouth  once a day 60 tablet 0  . metFORMIN (GLUCOPHAGE) 1000 MG tablet TAKE 1 TABLET BY MOUTH IN  THE MORNING, ONE-HALF  TABLET MIDDAY AND 1 TABLET  IN THE EVENING 225 tablet 1  . albuterol (PROVENTIL) (2.5 MG/3ML) 0.083% nebulizer solution Take 3 mLs (2.5 mg total) by nebulization 2 (two) times daily as needed for shortness of breath. (Patient not taking: Reported on 04/29/2016) 75 mL 0  . valACYclovir (VALTREX) 1000 MG tablet TAKE 1 TABLET BY MOUTH THREE TIMES A DAY (Patient  not taking: Reported on 04/29/2016) 21 tablet 0   No current facility-administered medications on file prior to visit.    Allergies  Allergen Reactions  . Amoxicillin-Pot Clavulanate Other (See Comments)    Severe stomach pain.   . Quinapril Hcl     REACTION: back pain  . Valsartan     REACTION: back pain  . Varenicline Tartrate     REACTION: AMS, hallucinations, night mares   Family History  Problem Relation Age of Onset  . Asthma Sister   .  Emphysema Sister   . Diabetes Mother   . Diabetes Brother   . Heart disease Brother    PE: BP 122/60 (BP Location: Left Arm, Patient Position: Sitting)   Pulse 93   Ht 5' 7" (1.702 m)   Wt 187 lb (84.8 kg)   SpO2 95%   BMI 29.29 kg/m  Body mass index is 29.29 kg/m. Wt Readings from Last 3 Encounters:  04/29/16 187 lb (84.8 kg)  12/12/15 179 lb (81.2 kg)  12/04/15 178 lb (80.7 kg)   Constitutional: overweight, in NAD, strong cigarette smoke smelwalks with cane Eyes: PERRLA, EOMI, no exophthalmos ENT: moist mucous membranes, no thyromegaly, no cervical lymphadenopathy Cardiovascular: RRR, No MRG Respiratory: CTA B, no wheezes Gastrointestinal: abdomen soft, NT, ND, BS+ Musculoskeletal: no deformities, strength intact in all 4 Skin: moist, warm, no rashes Neurological: no tremor with outstretched hands, DTR normal in all 4  ASSESSMENT: 1. DM2, insulin-dependent, uncontrolled, without complications  PLAN:  1. Patient with long-standing, uncontrolled diabetes, on oral antidiabetic regimen + basal insulin, with worse control after dinner >> we added a 5 mg Glipizide before dinner >> slightly better sugars but still high >> will increase Glipizide XL (crushed) to 10 mg. At next visit, may need rapid acting insulin before dinner - has this at home - I suggested to:  Patient Instructions  Please continue: - Glipizide XL 10 mg in am 1/2 tablet (5 mg) 15 min before dinner. - Metformin 1000 mg am and 1000 mg pm - Lantus 25 units at  bedtime  Please increase the Glipizide XL to 5 mg (crushed) before a larger dinner.  Please let me know about the sugars in 2 weeks.  Please return in 3 months with your sugar log.  - conttinue checking sugars at different times of the day - check 3 times a day, rotating checks - advised for yearly eye exams >> he is UTD - checked Hba1c today >> 7.9% (worse) - Return to clinic in 3 mo with sugar log

## 2016-04-29 NOTE — Addendum Note (Signed)
Addended by: Caprice Beaver T on: 04/29/2016 04:06 PM   Modules accepted: Orders

## 2016-04-29 NOTE — Patient Instructions (Addendum)
Please continue: - Glipizide XL 10 mg in am 1/2 tablet (5 mg) 15 min before dinner. - Metformin 1000 mg am and 1000 mg pm - Lantus 25 units at bedtime  Please increase the Glipizide XL to 5 mg (crushed) before a larger dinner.  Please let me know about the sugars in 2 weeks.  Please return in 3 months with your sugar log.

## 2016-05-06 ENCOUNTER — Ambulatory Visit (INDEPENDENT_AMBULATORY_CARE_PROVIDER_SITE_OTHER): Payer: Medicare Other | Admitting: Neurology

## 2016-05-06 ENCOUNTER — Encounter: Payer: Self-pay | Admitting: Neurology

## 2016-05-06 VITALS — BP 128/60 | HR 88 | Ht 68.0 in | Wt 181.0 lb

## 2016-05-06 DIAGNOSIS — I679 Cerebrovascular disease, unspecified: Secondary | ICD-10-CM | POA: Diagnosis not present

## 2016-05-06 DIAGNOSIS — G40209 Localization-related (focal) (partial) symptomatic epilepsy and epileptic syndromes with complex partial seizures, not intractable, without status epilepticus: Secondary | ICD-10-CM | POA: Diagnosis not present

## 2016-05-06 DIAGNOSIS — Z8673 Personal history of transient ischemic attack (TIA), and cerebral infarction without residual deficits: Secondary | ICD-10-CM | POA: Diagnosis not present

## 2016-05-06 NOTE — Patient Instructions (Signed)
1. Schedule 1-hour sleep-deprived EEG. We will call you with results, if normal, start reducing Lamictal 100mg : Take 1/2 tab in AM, 1 tab in PM for 1 week, then 1/2 tablet twice a day for 1 week, then 1/2 tablet in PM for 1 week, then stop. 2. Continue control of diabetes, blood pressure, cholesterol 3. Continue daily aspirin 4. Follow-up in 6 months, call for any changes

## 2016-05-06 NOTE — Progress Notes (Signed)
NEUROLOGY FOLLOW UP OFFICE NOTE  Jonathan [name] [id number]  HISTORY OF PRESENT ILLNESS: I had the pleasure of seeing Jonathan [name] in follow-up in the neurology clinic on 05/06/2016. The patient was last seen a year and a half ago for recurrent episodes of confusion, concerning for seizures. He is again accompanied by his wife who helps supplement the history. He is on Lamictal 179m BID with no further confusional episodes since March 2015. He continues to do well, they deny any speech difficulties, no staring/unresponsive episodes, gaps in time, olfactory/gustatory hallucinations, focal numbness/tingling/weakness, myoclonic jerks. He has chronic back pain and occasional vertigo. He ambulates with a cane.  HPI 02/27/14: This is a pleasant 75yo RH man with a history of hypertension, hyperlipidemia, diabetes, COPD, stroke in 2013, who presented for recurrent episodes of confusion. He went to the ER on 11/23/13 for productive cough and hypoxia, and was admitted for 3 days for COPD exacerbation. His wife reports that he appeared confused that day, he did not seem to comprehend what she was saying, with a blank stare, and could not get his shirt on. He was discharged home on home O2, and they report that he continued to have some difficulty breathing and felt diffusely weak. On 11/28/13, his wife heard a fall and found him halfway in the bathroom tub, incoherent with eyes open, unresponsive to questions. He had bladder and bowel incontinence and was apparently agitated and fighting EMS. He does not recall this, and does not recall much of his 10-day hospitalization with diagnosis of acute on chronic respiratory failure. He was discharged home, then again returned on 12/14/13 when home PT noted confusion and agitation. His wife reports that he was confused and his attitude changes ("so mean"), he was telling PT to get out of their house. In the ER, he was evaluated by neurologist Dr. SNicole Kindredand noted to have  nonsensical speech with expressive and receptive aphasia. CT head and MRI brain which I personally reviewed, no evidence of acute infarct. There was extensive chronic small vessel changes and small old right occipital cortical infarction. His MRA showed atherosclerotic narrowing and irregularity of both carotid siphon regions, severely diseased right A1 segment which does not definitely show flow, both anterior cerebral arteries are appear well supplied from the left carotid system. Severely diseased left vertebral artery. The right vertebral artery is dominant and widely patent to the basilar. Posterior circulation branch vessels are patent but show atherosclerotic irregularity. His routine EEG showed focal delta and theta slowing most prominent at F7, T7. He was started on Keppra 5071mBID.   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 TEE negative. His brother had one seizure at age 4261Otherwise he had a normal birth and early development. There is no history of febrile convulsions, CNS infections such as meningitis/encephalitis, significant traumatic brain injury, neurosurgical procedures.   PAST MEDICAL HISTORY: Past Medical History:  Diagnosis Date  . Allergic rhinitis   . Asthma   . [place]  . Dermatitis seborrheica   . Diabetes mellitus type II   . ED (erectile dysfunction)   . Frozen shoulder   . HLD (hyperlipidemia)   . HTN (hypertension)   . Hyperkalemia   . Kidney stone   . Other diseases of lung, not elsewhere classified   . Pulmonary nodule, right    lower lobe  . Rotator cuff  injury    right  . Shoulder pain   . Stroke [facility])    pt states "last year"  . Tobacco abuse     MEDICATIONS: Current Outpatient Prescriptions on File Prior to Visit  Medication Sig Dispense Refill  . ACCU-CHEK AVIVA PLUS test strip Check sugars 3-4 times daily. Dx  E11/65 200 each 5  . albuterol (PROVENTIL) (2.5 MG/3ML) 0.083% nebulizer solution Take 3 mLs (2.5 mg total) by nebulization 2 (two) times daily as needed for shortness of breath. 75 mL 0  . albuterol (VENTOLIN HFA) 108 (90 BASE) MCG/ACT inhaler Inhale 2 puffs into the lungs every 6 (six) hours as needed for wheezing. 1 Inhaler 11  . amLODipine (NORVASC) 5 MG tablet Take 1 tablet by mouth  daily 90 tablet 3  . aspirin 81 MG tablet Take 1 tablet (81 mg total) by mouth daily. 30 tablet 0  . atorvastatin (LIPITOR) 10 MG tablet Take 1 tablet by mouth  daily 90 tablet 3  . Blood Glucose Monitoring Suppl (ACCU-CHEK AVIVA PLUS) W/DEVICE KIT USE AS DIRECTED 1 kit 0  . glipiZIDE (GLUCOTROL XL) 10 MG 24 hr tablet Take 1 tablet in the morning and half a tablet before dinner 135 tablet 1  . insulin glargine (LANTUS) 100 unit/mL SOPN Inject 0.25 mLs (25 Units total) into the skin daily at 8 pm. 15 mL 2  . Insulin Pen Needle (CAREFINE PEN NEEDLES) 32G X 6 MM MISC Use to inject insulin at bedtime. 90 each 2  . lamoTRIgine (LAMICTAL) 100 MG tablet Take 1 tablet BID 60 tablet 0  . metFORMIN (GLUCOPHAGE) 1000 MG tablet TAKE 1 TABLET BY MOUTH IN  THE MORNING, ONE-HALF  TABLET MIDDAY AND 1 TABLET  IN THE EVENING 225 tablet 1  . valACYclovir (VALTREX) 1000 MG tablet TAKE 1 TABLET BY MOUTH THREE TIMES A DAY 21 tablet 0   No current facility-administered medications on file prior to visit.     ALLERGIES: Allergies  Allergen Reactions  . Amoxicillin-Pot Clavulanate Other (See Comments)    Severe stomach pain.   . Quinapril Hcl     REACTION: back pain  . Valsartan     REACTION: back pain  . Varenicline Tartrate     REACTION: AMS, hallucinations, night mares    FAMILY HISTORY: Family History  Problem Relation Age of Onset  . Asthma Sister   . Emphysema Sister   . Diabetes Mother   . Diabetes Brother   . Heart disease Brother     SOCIAL HISTORY: Social History   Social History  . Marital status:  Married    Spouse name: N/A  . Number of children: N/A  . Years of education: N/A   Occupational History  . Retired     Air cabin crew   Social History Main Topics  . Smoking status: Current Some Day Smoker    Packs/day: 0.20    Types: Cigarettes  . Smokeless tobacco: Never Used     Comment: prev smoked 1 1/2 PPD. Currently 2 cigs daily  . Alcohol use No     Comment: Quit 40 years ago  . Drug use: No  . Sexual activity: No   Other Topics Concern  . Not on file   Social History Narrative  . No narrative on file    REVIEW OF SYSTEMS: Constitutional: No fevers, chills, or sweats, no generalized fatigue, change in appetite Eyes: No visual changes, double vision, eye pain Ear, nose and throat: No hearing loss, ear pain,  nasal congestion, sore throat Cardiovascular: No chest pain, palpitations Respiratory:  No shortness of breath at rest or with exertion, wheezes GastrointestinaI: No nausea, vomiting, diarrhea, abdominal pain, fecal incontinence Genitourinary:  No dysuria, urinary retention or frequency Musculoskeletal:  No neck pain, +back pain Integumentary: No rash, pruritus, skin lesions Neurological: as above Psychiatric: No depression, insomnia, anxiety Endocrine: No palpitations, fatigue, diaphoresis, mood swings, change in appetite, change in weight, increased thirst Hematologic/Lymphatic:  No anemia, purpura, petechiae. Allergic/Immunologic: no itchy/runny eyes, nasal congestion, recent allergic reactions, rashes  PHYSICAL EXAM: Vitals:   05/06/16 1443  BP: 128/60  Pulse: 88   General: No acute distress Head:  Normocephalic/atraumatic Neck: supple, no paraspinal tenderness, full range of motion Heart:  Regular rate and rhythm Lungs:  Clear to auscultation bilaterally Back: No paraspinal tenderness Skin/Extremities: No rash, no edema Neurological Exam: alert and oriented to person, place, and time, no dysarthria or aphasia, Fund of knowledge is appropriate.  Recent and remote memory are intact. Attention and concentration are normal. Able to name objects and repeat phrases.  Cranial nerves:  CN I: not tested  CN II: pupils equal, round and reactive to light, visual fields intact, fundi unremarkable.  CN III, IV, VI: full range of motion, no nystagmus, no ptosis  CN V: facial sensation intact  CN VII: upper and lower face symmetric  CN VIII: hearing intact to finger rub  CN IX, X: gag intact, uvula midline  CN XI: sternocleidomastoid and trapezius muscles intact  CN XII: tongue midline  Bulk & Tone: normal, no cogwheeling, no fasciculations.  Motor: 5/5 throughout with no pronator drift.  Sensation: Intact to LT. No extinction to double simultaneous stimulation. 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 He had side effects on Keppra, now tolerating Lamictal 12m BID with no further episodes of confusion or aphasia. He has been 2-years event-free and is interested in tapering off Lamictal. A 1-hour sleep-deprived EEG will be ordered. If normal, we will plan to start tapering Lamictal. He understands risk of breakthrough  seizure with any medication adjustment and will not drive 6 months from medication taper. We again discussed stroke risk factors, intracranial stenosis, and maximum medical management with control of vascular risk factors (hypertension, diabetes, hyperlipidemia, and smoking cessation). He will follow-up in 6 months or earlier if needed, and knows to go to the ER if symptoms recur or progress.  Thank you for allowing me to participate in his care.  Please do not hesitate to call for any questions or concerns.  The duration of this appointment visit was 25 minutes of face-to-face time with the patient.  Greater than 50% of this time was spent in counseling, explanation of diagnosis, planning of further management, and coordination of care.   KEllouise Newer M.D.  CC: Dr. Glori Bickers

## 2016-05-15 ENCOUNTER — Other Ambulatory Visit: Payer: Medicare Other

## 2016-05-15 DIAGNOSIS — Z029 Encounter for administrative examinations, unspecified: Secondary | ICD-10-CM

## 2016-05-22 ENCOUNTER — Telehealth: Payer: Self-pay

## 2016-05-22 NOTE — Telephone Encounter (Signed)
Patient is on the list for [employer] 2017 and may be a good candidate for an AWV in 2017. Please let me know if/when appt is scheduled.   

## 2016-05-26 ENCOUNTER — Other Ambulatory Visit: Payer: Self-pay | Admitting: Internal Medicine

## 2016-06-01 ENCOUNTER — Other Ambulatory Visit: Payer: Self-pay | Admitting: Internal Medicine

## 2016-06-07 ENCOUNTER — Other Ambulatory Visit: Payer: Self-pay | Admitting: Neurology

## 2016-06-07 DIAGNOSIS — G40209 Localization-related (focal) (partial) symptomatic epilepsy and epileptic syndromes with complex partial seizures, not intractable, without status epilepticus: Secondary | ICD-10-CM

## 2016-06-09 NOTE — Telephone Encounter (Signed)
Called pharmacy and medication was filled for 30 days on 05/31/16. Pt's last OV was 05/06/16 where you had discussed tapering pt off Lamictal. Please advise.

## 2016-06-10 NOTE — Telephone Encounter (Signed)
We had talked about potentially tapering medication, IF the EEG was normal. He did not do the EEG. Can you pls call patient and ask if he would still like to do EEG or to continue on medication? If continue, pls send Rx for same med, with 11 refills, thanks!

## 2016-06-10 NOTE — Telephone Encounter (Signed)
Verified with pt. Pt scheduled EEG appt for 06/26/16. Pt stated had Lamictal no need for a refill at this time.

## 2016-06-15 ENCOUNTER — Other Ambulatory Visit: Payer: Self-pay | Admitting: Internal Medicine

## 2016-06-16 ENCOUNTER — Other Ambulatory Visit: Payer: Medicare Other

## 2016-06-16 ENCOUNTER — Other Ambulatory Visit: Payer: Self-pay

## 2016-06-16 MED ORDER — GLIPIZIDE ER 10 MG PO TB24: ORAL_TABLET | ORAL | 1 refills | Status: DC

## 2016-06-26 ENCOUNTER — Other Ambulatory Visit: Payer: Self-pay | Admitting: Internal Medicine

## 2016-06-26 ENCOUNTER — Ambulatory Visit (INDEPENDENT_AMBULATORY_CARE_PROVIDER_SITE_OTHER): Payer: Medicare Other | Admitting: Neurology

## 2016-06-26 DIAGNOSIS — G40209 Localization-related (focal) (partial) symptomatic epilepsy and epileptic syndromes with complex partial seizures, not intractable, without status epilepticus: Secondary | ICD-10-CM | POA: Diagnosis not present

## 2016-06-29 NOTE — Procedures (Signed)
ELECTROENCEPHALOGRAM REPORT  Date of Study: 06/26/2016  Patient's Name: Jonathan [name] MRN: [id number] Date of Birth: September 25, 1941  Referring Provider: Dr. Ellouise Newer  Clinical History: This is a 75 year old man with recurrent episodes of confusion in 2015, interested in tapering seizure medication.  Medications: PROVENTIL VENTOLIN HFA 90 BASE NORVASC aspirin 81 MG LIPITOR GLUCOTROL XL LANTUS CAREFINE PEN NEEDLES LAMICTAL GLUCOPHAGE VALTREX     Technical Summary: A multichannel digital 1-hour sleep-deprived EEG recording measured by the international 10-20 system with electrodes applied with paste and impedances below 5000 ohms performed in our laboratory with EKG monitoring in an awake and drowsy patient.  Hyperventilation was not performed. Photic stimulation was performed.  The digital EEG was referentially recorded, reformatted, and digitally filtered in a variety of bipolar and referential montages for optimal display.    Description: The patient is awake and asleep during the recording.  During maximal wakefulness, there is a symmetric, medium voltage 9 Hz posterior dominant rhythm that attenuates with eye opening.  The record is symmetric.  During drowsiness and sleep, there is an increase in theta slowing of the background. Deeper stages of sleep were not seen.  Photic stimulation did not elicit any abnormalities.  There were no epileptiform discharges or electrographic seizures seen.    EKG lead was unremarkable.  Impression: This 1-hour awake and drowsy EEG is normal.    Clinical Correlation: A normal EEG does not exclude a clinical diagnosis of epilepsy.  If further clinical questions remain, prolonged EEG may be helpful.  Clinical correlation is advised.   Ellouise Newer, M.D.

## 2016-07-02 NOTE — Telephone Encounter (Signed)
Patient need a refill of ACCU-CHEK AVIVA PLUS test strip 4 pack with 50 in a box.

## 2016-07-31 ENCOUNTER — Ambulatory Visit (INDEPENDENT_AMBULATORY_CARE_PROVIDER_SITE_OTHER): Payer: Medicare Other | Admitting: Internal Medicine

## 2016-07-31 ENCOUNTER — Encounter: Payer: Self-pay | Admitting: Internal Medicine

## 2016-07-31 VITALS — BP 130/78 | HR 108 | Ht 68.0 in | Wt 180.0 lb

## 2016-07-31 DIAGNOSIS — Z794 Long term (current) use of insulin: Secondary | ICD-10-CM

## 2016-07-31 DIAGNOSIS — E1165 Type 2 diabetes mellitus with hyperglycemia: Secondary | ICD-10-CM | POA: Diagnosis not present

## 2016-07-31 LAB — POCT GLYCOSYLATED HEMOGLOBIN (HGB A1C): Hemoglobin A1C: 7.9

## 2016-07-31 NOTE — Addendum Note (Signed)
Addended by: Caprice Beaver T on: 07/31/2016 04:34 PM   Modules accepted: Orders

## 2016-07-31 NOTE — Patient Instructions (Signed)
Please continue: - Glipizide XL 10 mg in am and 1/2 tablet (5 mg) 15 min before a larger dinner - Metformin 1000 mg 2x a day - Lantus 25 units at bedtime  Please stop at the lab.  Please return in 3 months with your sugar log.

## 2016-07-31 NOTE — Progress Notes (Addendum)
Patient ID: Jonathan [name], male   DOB: January 13, 1941, 75 y.o.   MRN: [id number]  Jonathan [name] is a 75 y.o.-year-old male, returning for follow up management of DM2, dx 1990, uncontrolled, insulin-dep since ~9311, without complications (h/o stroke). Last visit 3 mo ago.  He was taken off Lamictal >> sugars better.  Last hemoglobin A1c was: Lab Results  Component Value Date   HGBA1C 7.9 04/29/2016   HGBA1C 7.4 12/04/2015   HGBA1C 6.8 09/06/2015   He is on: - Glipizide XL 10 mg in am 1/2 tablet (5 mg) 15 min before dinner. - Metformin 1000 mg 2x a day  - Lantus 25 units at bedtime Humalog 4 units qac TID, if FS>150 ( hasn't used it recently, in >3 months)  Checks sugars 3x a day: - am: 82-135 >> 66, 87-123 >> 69, 81-121 >> 60, 67, 78-125 >> 84-111, 128 >> 85-123 - 2h after b'fast: n/c - lunch: 104-221 >> 92, 100-159, 228, 259 (after fish) >> 67, 73-159 >> 79-153, 214 (snack) >> 98, 121-149, 157 >> 85-132, 155 - 2h after lunch: n/c - dinner: n/c >> 110-141 >> 81-128 >> 88-133, 158 (late lunch) - 2h after dinner: 89-200 >> 113-215 >> 109-175, 190, 217 >> 161-236 >> 155-239 (cake) >> n/c - bedtime: n/c  Dietary habits- eats three times daily.  Exercise- cannot exercise, due to SOB  Diabetes Complications-  Stage 2  CKD, last BUN/creatinine: Lab Results  Component Value Date   BUN 19 12/12/2015   CREATININE 1.24 12/12/2015   Lab Results  Component Value Date   GFR 60.33 12/12/2015   MICRALBCREAT 12.7 04/28/2014   Allergic to ACE-I and ARB. Retinopathy- No, Last eye exam was in 2015. Neuropathy- no numbness and tingling in feet. No known neuropathy.  Associated history - No CAD . No hypothyroidism. his last TSH was  Lab Results  Component Value Date   TSH 3.58 12/12/2015   Has HTG: Lab Results  Component Value Date   CHOL 134 12/12/2015   HDL 33.70 (L) 12/12/2015   LDLCALC 58 09/01/2014   LDLDIRECT 75.0 12/12/2015   TRIG 239.0 (H) 12/12/2015   CHOLHDL 4 12/12/2015   Currently on Lipitor 10. Tolerating it well.    Smokes 1 PPD.  * Around early 2015, he was hospitalized after syncope, was in ICU, was on assisted ventilation, expressive aphasia recovered- believed to have a stroke, now better.    ROS: Constitutional: no weight gain/loss, + fatigue, no subjective hyperthermia/hypothermia, + nocturia Eyes: no blurry vision, no xerophthalmia ENT: no sore throat, no nodules palpated in throat, no dysphagia/odynophagia, no hoarseness Cardiovascular: + CP occas. /+ SOB/no palpitations/leg swelling Respiratory: + cough/+ SOB/+ wheezing Gastrointestinal: no N/V/D/C Musculoskeletal: no muscle/no joint aches Skin: no rashes Neurological: + tremors/no numbness/tingling/dizziness, HAs  I reviewed pt's medications, allergies, PMH, social hx, family hx, and changes were documented in the history of present illness. Otherwise, unchanged from my initial visit note.  Past Medical History:  Diagnosis Date  . Allergic rhinitis   . Asthma   . COPD (chronic obstructive pulmonary disease) ([place])   . Dermatitis seborrheica   . Diabetes mellitus type II   . ED (erectile dysfunction)   . Frozen shoulder   . HLD (hyperlipidemia)   . HTN (hypertension)   . Hyperkalemia   . Kidney stone   . Other diseases of lung, not elsewhere classified   . Pulmonary nodule, right    lower lobe  . Rotator cuff injury  right  . Shoulder pain   . Stroke [facility])    pt states "last year"  . Tobacco abuse    Past Surgical History:  Procedure Laterality Date  . CERVICAL DISCECTOMY  1998  . EEG    . ETT  11/1994  . TEE WITHOUT CARDIOVERSION  12/03/2011   Procedure: TRANSESOPHAGEAL ECHOCARDIOGRAM (TEE);  Surgeon: Lelon Perla, MD;  Location: [facility] ENDOSCOPY;  Service: Cardiovascular;  Laterality: N/A;  . TM repair     Social History   Social History  . Marital status: Married    Spouse name: N/A  . Number of children: N/A  . Years of education: N/A   Occupational History   . Retired     Air cabin crew   Social History Main Topics  . Smoking status: Current Some Day Smoker    Packs/day: 0.20    Types: Cigarettes  . Smokeless tobacco: Never Used     Comment: prev smoked 1 1/2 PPD. Currently 2 cigs daily  . Alcohol use No     Comment: Quit 40 years ago  . Drug use: No  . Sexual activity: No   Other Topics Concern  . Not on file   Social History Narrative  . No narrative on file   Current Outpatient Prescriptions on File Prior to Visit  Medication Sig Dispense Refill  . ACCU-CHEK AVIVA PLUS test strip USE TO CHECK BLOOD GLUCOSE LEVELS 3 TO 4TIMES DAILY 200 each 2  . albuterol (PROVENTIL) (2.5 MG/3ML) 0.083% nebulizer solution Take 3 mLs (2.5 mg total) by nebulization 2 (two) times daily as needed for shortness of breath. 75 mL 0  . albuterol (VENTOLIN HFA) 108 (90 BASE) MCG/ACT inhaler Inhale 2 puffs into the lungs every 6 (six) hours as needed for wheezing. 1 Inhaler 11  . amLODipine (NORVASC) 5 MG tablet Take 1 tablet by mouth  daily 90 tablet 3  . aspirin 81 MG tablet Take 1 tablet (81 mg total) by mouth daily. 30 tablet 0  . atorvastatin (LIPITOR) 10 MG tablet Take 1 tablet by mouth  daily 90 tablet 3  . Blood Glucose Monitoring Suppl (ACCU-CHEK AVIVA PLUS) W/DEVICE KIT USE AS DIRECTED 1 kit 0  . glipiZIDE (GLUCOTROL XL) 10 MG 24 hr tablet Take 1 tablet in the morning and half a tablet before dinner 135 tablet 1  . GLOBAL EASE INJECT PEN NEEDLES 32G X 4 MM MISC AS DIRECTED 100 each 2  . insulin glargine (LANTUS) 100 unit/mL SOPN Inject 0.25 mLs (25 Units total) into the skin daily at 8 pm. 15 mL 2  . lamoTRIgine (LAMICTAL) 100 MG tablet Take 2 tablets by mouth  once a day 60 tablet 0  . metFORMIN (GLUCOPHAGE) 1000 MG tablet TAKE 1 TABLET BY MOUTH IN  THE MORNING, ONE-HALF  TABLET MIDDAY AND 1 TABLET  IN THE EVENING 200 tablet 1  . valACYclovir (VALTREX) 1000 MG tablet TAKE 1 TABLET BY MOUTH THREE TIMES A DAY 21 tablet 0   No current  facility-administered medications on file prior to visit.    Allergies  Allergen Reactions  . Amoxicillin-Pot Clavulanate Other (See Comments)    Severe stomach pain.   . Quinapril Hcl     REACTION: back pain  . Valsartan     REACTION: back pain  . Varenicline Tartrate     REACTION: AMS, hallucinations, night mares   Family History  Problem Relation Age of Onset  . Asthma Sister   . Emphysema Sister   .  Diabetes Mother   . Diabetes Brother   . Heart disease Brother    PE: BP 130/78 (BP Location: Left Arm, Patient Position: Sitting)   Pulse (!) 108   Ht 5' 8"  (1.727 m)   Wt 180 lb (81.6 kg)   SpO2 94%   BMI 27.37 kg/m  Body mass index is 27.37 kg/m. Wt Readings from Last 3 Encounters:  07/31/16 180 lb (81.6 kg)  05/06/16 181 lb (82.1 kg)  04/29/16 187 lb (84.8 kg)   Constitutional: overweight, in NAD, strong cigarette smoke smell, walks with cane Eyes: PERRLA, EOMI, no exophthalmos ENT: moist mucous membranes, no thyromegaly, no cervical lymphadenopathy Cardiovascular: RRR, No MRG Respiratory: CTA B, no wheezes Gastrointestinal: abdomen soft, NT, ND, BS+ Musculoskeletal: no deformities, strength intact in all 4 Skin: moist, warm, no rashes Neurological: no tremor with outstretched hands, DTR normal in all 4  ASSESSMENT: 1. DM2, insulin-dependent, uncontrolled, without complications  PLAN:  1. Patient with long-standing, uncontrolled diabetes, on oral antidiabetic regimen + basal insulin, with worse control after dinner >> we added a 5 mg Glipizide before dinner at last visit, but not taking it as it pulverizes when cut. I advised him to try to cut it and take this before a large meal.  - I suggested to:  Patient Instructions  Please continue: - Glipizide XL 10 mg in am and 1/2 tablet (5 mg) 15 min before a larger dinner - Metformin 1000 mg 2x a day - Lantus 25 units at bedtime  Please stop at the lab.  Please return in 3 months with your sugar log.  -  conttinue checking sugars at different times of the day - check 3 times a day, rotating checks - advised for yearly eye exams >> he needs one >> advised to schedule - checked Hba1c today >> 7.9% (stable), but sugars in log are better >> will check afructosamine - Return to clinic in 3 mo with sugar log   Office Visit on 07/31/2016  Component Date Value Ref Range Status  . Fructosamine 08/04/2016 267  190 - 270 umol/L Final  . Hemoglobin A1C 07/31/2016 7.9   Final   HbA1c calculated from the fructosamine is great, at 6.15%!  Philemon Kingdom, MD PhD [facility] Endocrinology

## 2016-08-04 LAB — FRUCTOSAMINE: Fructosamine: 267 umol/L (ref 190–270)

## 2016-08-05 ENCOUNTER — Telehealth: Payer: Self-pay

## 2016-08-05 NOTE — Telephone Encounter (Signed)
Called patient and gave lab results. Patient had no questions or concerns.  

## 2016-09-15 ENCOUNTER — Ambulatory Visit (INDEPENDENT_AMBULATORY_CARE_PROVIDER_SITE_OTHER): Payer: Medicare Other

## 2016-09-15 ENCOUNTER — Telehealth: Payer: Self-pay

## 2016-09-15 VITALS — BP 122/78 | HR 94 | Temp 98.1°F | Ht 66.0 in | Wt 180.5 lb

## 2016-09-15 DIAGNOSIS — E1165 Type 2 diabetes mellitus with hyperglycemia: Secondary | ICD-10-CM

## 2016-09-15 DIAGNOSIS — Z0000 Encounter for general adult medical examination without abnormal findings: Secondary | ICD-10-CM

## 2016-09-15 DIAGNOSIS — Z794 Long term (current) use of insulin: Secondary | ICD-10-CM

## 2016-09-15 LAB — MICROALBUMIN / CREATININE URINE RATIO
Creatinine,U: 139 mg/dL
Microalb Creat Ratio: 7.6 mg/g (ref 0.0–30.0)
Microalb, Ur: 10.5 mg/dL — ABNORMAL HIGH (ref 0.0–1.9)

## 2016-09-15 NOTE — Progress Notes (Signed)
PCP notes:   Health maintenance:  Urine microalbumin - completed  Abnormal screenings:   Hearing - failed Mini-Cog score: 19/20 Depression score: 14  Patient concerns:   Pt wants order for portable oxygen. Request submitted to pulmonologist.   Nurse concerns:  None  Next PCP appt:   09/26/16 @ 1515  I reviewed health advisor's note, was available for consultation, and agree with documentation and plan. Loura Pardon MD

## 2016-09-15 NOTE — Telephone Encounter (Signed)
Dr. Elsworth Soho,  Patient was in today for his Annual Wellness Visit. Pt requested to have an order for portable oxygen when he is away from home. His O2 sats today were 95% (sitting for approx. 30 min).

## 2016-09-15 NOTE — Telephone Encounter (Signed)
Last seen in 2012 A saturation of 95% does not qualify him for oxygen  He will need follow-up appointment - please arrange

## 2016-09-15 NOTE — Patient Instructions (Signed)
Mr. Wiles , Thank you for taking time to come for your Medicare Wellness Visit. I appreciate your ongoing commitment to your health goals. Please review the following plan we discussed and let me know if I can assist you in the future.   These are the goals we discussed: Goals    . other          Starting 09/15/2016, I will continue to use oxygen at home as needed to reduce shortness of breath.        This is a list of the screening recommended for you and due dates:  Health Maintenance  Topic Date Due  . Eye exam for diabetics  07/31/2017*  . Tetanus Vaccine  10/31/2020*  . Pneumonia vaccines (1 of 2 - PCV13) 10/31/2020*  . Shingles Vaccine  07/05/2021*  . Flu Shot  11/01/2023*  . Complete foot exam   12/11/2016  . Hemoglobin A1C  01/28/2017  . Urine Protein Check  09/15/2017  *Topic was postponed. The date shown is not the original due date.   Preventive Care for Adults  A healthy lifestyle and preventive care can promote health and wellness. Preventive health guidelines for adults include the following key practices.  . A routine yearly physical is a good way to check with your health care provider about your health and preventive screening. It is a chance to share any concerns and updates on your health and to receive a thorough exam.  . Visit your dentist for a routine exam and preventive care every 6 months. Brush your teeth twice a day and floss once a day. Good oral hygiene prevents tooth decay and gum disease.  . The frequency of eye exams is based on your age, health, family medical history, use  of contact lenses, and other factors. Follow your health care provider's ecommendations for frequency of eye exams.  . Eat a healthy diet. Foods like vegetables, fruits, whole grains, low-fat dairy products, and lean protein foods contain the nutrients you need without too many calories. Decrease your intake of foods high in solid fats, added sugars, and salt. Eat the right  amount of calories for you. Get information about a proper diet from your health care provider, if necessary.  . Regular physical exercise is one of the most important things you can do for your health. Most adults should get at least 150 minutes of moderate-intensity exercise (any activity that increases your heart rate and causes you to sweat) each week. In addition, most adults need muscle-strengthening exercises on 2 or more days a week.  Silver Sneakers may be a benefit available to you. To determine eligibility, you may visit the website: www.silversneakers.com or contact program at 336-801-6824 Mon-Fri between 8AM-8PM.   . Maintain a healthy weight. The body mass index (BMI) is a screening tool to identify possible weight problems. It provides an estimate of body fat based on height and weight. Your health care provider can find your BMI and can help you achieve or maintain a healthy weight.   For adults 20 years and older: ? A BMI below 18.5 is considered underweight. ? A BMI of 18.5 to 24.9 is normal. ? A BMI of 25 to 29.9 is considered overweight. ? A BMI of 30 and above is considered obese.   . Maintain normal blood lipids and cholesterol levels by exercising and minimizing your intake of saturated fat. Eat a balanced diet with plenty of fruit and vegetables. Blood tests for lipids and cholesterol should begin  at age 15 and be repeated every 5 years. If your lipid or cholesterol levels are high, you are over 50, or you are at high risk for heart disease, you may need your cholesterol levels checked more frequently. Ongoing high lipid and cholesterol levels should be treated with medicines if diet and exercise are not working.  . If you smoke, find out from your health care provider how to quit. If you do not use tobacco, please do not start.  . If you choose to drink alcohol, please do not consume more than 2 drinks per day. One drink is considered to be 12 ounces (355 mL) of beer, 5  ounces (148 mL) of wine, or 1.5 ounces (44 mL) of liquor.  . If you are 25-5 years old, ask your health care provider if you should take aspirin to prevent strokes.  . Use sunscreen. Apply sunscreen liberally and repeatedly throughout the day. You should seek shade when your shadow is shorter than you. Protect yourself by wearing long sleeves, pants, a wide-brimmed hat, and sunglasses year round, whenever you are outdoors.  . Once a month, do a whole body skin exam, using a mirror to look at the skin on your back. Tell your health care provider of new moles, moles that have irregular borders, moles that are larger than a pencil eraser, or moles that have changed in shape or color.

## 2016-09-15 NOTE — Progress Notes (Signed)
Subjective:   Jonathan [name] is a 75 y.o. male who presents for an Initial Medicare Annual Wellness Visit.  Review of Systems  N/A Cardiac Risk Factors include: advanced age (>38mn, >>41women);male gender;diabetes mellitus;dyslipidemia;smoking/ tobacco exposure    Objective:    Today's Vitals   09/15/16 1350 09/15/16 1404  BP: 122/78   Pulse: 94   Temp: 98.1 F (36.7 C)   TempSrc: Oral   SpO2: 95%   Weight: 180 lb 8 oz (81.9 kg)   Height: 5' 6"  (1.676 m)   PainSc: 5  5   PainLoc: Back    Body mass index is 29.13 kg/m.  Current Medications (verified) Outpatient Encounter Prescriptions as of 09/15/2016  Medication Sig  . ACCU-CHEK AVIVA PLUS test strip USE TO CHECK BLOOD GLUCOSE LEVELS 3 TO 4TIMES DAILY  . albuterol (VENTOLIN HFA) 108 (90 BASE) MCG/ACT inhaler Inhale 2 puffs into the lungs every 6 (six) hours as needed for wheezing.  .Marland KitchenamLODipine (NORVASC) 5 MG tablet Take 1 tablet by mouth  daily  . aspirin 81 MG tablet Take 1 tablet (81 mg total) by mouth daily.  .Marland Kitchenatorvastatin (LIPITOR) 10 MG tablet Take 1 tablet by mouth  daily  . Blood Glucose Monitoring Suppl (ACCU-CHEK AVIVA PLUS) W/DEVICE KIT USE AS DIRECTED  . glipiZIDE (GLUCOTROL XL) 10 MG 24 hr tablet Take 1 tablet in the morning and half a tablet before dinner  . GLOBAL EASE INJECT PEN NEEDLES 32G X 4 MM MISC AS DIRECTED  . insulin glargine (LANTUS) 100 unit/mL SOPN Inject 0.25 mLs (25 Units total) into the skin daily at 8 pm.  . metFORMIN (GLUCOPHAGE) 1000 MG tablet TAKE 1 TABLET BY MOUTH IN  THE MORNING, ONE-HALF  TABLET MIDDAY AND 1 TABLET  IN THE EVENING  . albuterol (PROVENTIL) (2.5 MG/3ML) 0.083% nebulizer solution Take 3 mLs (2.5 mg total) by nebulization 2 (two) times daily as needed for shortness of breath. (Patient not taking: Reported on 09/15/2016)  . lamoTRIgine (LAMICTAL) 100 MG tablet Take 2 tablets by mouth  once a day (Patient not taking: Reported on 09/15/2016)  . [DISCONTINUED] valACYclovir  (VALTREX) 1000 MG tablet TAKE 1 TABLET BY MOUTH THREE TIMES A DAY (Patient not taking: Reported on 09/15/2016)   No facility-administered encounter medications on file as of 09/15/2016.     Allergies (verified) Amoxicillin-pot clavulanate; Quinapril hcl; Valsartan; and Varenicline tartrate   History: Past Medical History:  Diagnosis Date  . Allergic rhinitis   . Asthma   . COPD (chronic obstructive pulmonary disease) ([place]   . Dermatitis seborrheica   . Diabetes mellitus type II   . ED (erectile dysfunction)   . Frozen shoulder   . HLD (hyperlipidemia)   . HTN (hypertension)   . Hyperkalemia   . Kidney stone   . Other diseases of lung, not elsewhere classified   . Pulmonary nodule, right    lower lobe  . Rotator cuff injury    right  . Shoulder pain   . Stroke ([facility]    pt states "last year"  . Tobacco abuse    Past Surgical History:  Procedure Laterality Date  . CERVICAL DISCECTOMY  1998  . EEG    . ETT  11/1994  . TEE WITHOUT CARDIOVERSION  12/03/2011   Procedure: TRANSESOPHAGEAL ECHOCARDIOGRAM (TEE);  Surgeon: BLelon Perla MD;  [facility];  Service: Cardiovascular;  Laterality: N/A;  . TM repair     Family History  Problem Relation Age of Onset  .  Asthma Sister   . Emphysema Sister   . Diabetes Mother   . Diabetes Brother   . Heart disease Brother    Social History   Occupational History  . Retired     Air cabin crew   Social History Main Topics  . Smoking status: Current Every Day Smoker    Packs/day: 1.00    Types: Cigarettes  . Smokeless tobacco: Never Used  . Alcohol use No     Comment: Quit 40 years ago  . Drug use: No  . Sexual activity: No   Tobacco Counseling Ready to quit: No Counseling given: No   Activities of Daily Living In your present state of health, do you have any difficulty performing the following activities: 09/15/2016  Hearing? N  Vision? N  Difficulty concentrating or making decisions? N  Walking or  climbing stairs? Y  Dressing or bathing? N  Doing errands, shopping? N  Preparing Food and eating ? N  Using the Toilet? N  In the past six months, have you accidently leaked urine? N  Do you have problems with loss of bowel control? N  Managing your Medications? Y  Managing your Finances? Y  Housekeeping or managing your Housekeeping? Y  Some recent data might be hidden    Immunizations and Health Maintenance Immunization History  Administered Date(s) Administered  . Td 09/29/2002   There are no preventive care reminders to display for this patient.  Patient Care Team: Abner Greenspan, MD as PCP - General Rigoberto Noel, MD as Consulting Physician (Pulmonary Disease) Philemon Kingdom, MD as Consulting Physician (Internal Medicine) Cameron Sprang, MD as Consulting Physician (Neurology)     Assessment:   This is a routine wellness examination for [place].   Hearing/Vision screen  Hearing Screening   125Hz  250Hz  500Hz  1000Hz  2000Hz  3000Hz  4000Hz  6000Hz  8000Hz   Right ear:   0 0 0  0    Left ear:   40 0 40  0      Visual Acuity Screening   Right eye Left eye Both eyes  Without correction: 20/25-1 20/50-1 20/25-1  With correction:       Dietary issues and exercise activities discussed: Current Exercise Habits: The patient does not participate in regular exercise at present, Exercise limited by: respiratory conditions(s);cardiac condition(s)  Goals    . other          Starting 09/15/2016, I will continue to use oxygen at home as needed to reduce shortness of breath.       Depression Screen PHQ 2/9 Scores 09/15/2016 07/05/2012  PHQ - 2 Score 6 0  PHQ- 9 Score 14 -    Fall Risk Fall Risk  09/15/2016  Falls in the past year? No    Cognitive Function: MMSE - Mini Mental State Exam 09/15/2016  Orientation to time 5  Orientation to Place 5  Registration 3  Attention/ Calculation 0  Recall 2  Recall-comments pt was unable to recall 1 of 3 words  Language- name 2  objects 0  Language- repeat 1  Language- follow 3 step command 3  Language- read & follow direction 0  Write a sentence 0  Copy design 0  Total score 19       PLEASE NOTE: A Mini-Cog screen was completed. Maximum score is 20. A value of 0 denotes this part of [place]stein MMSE was not completed or the patient failed this part of the Mini-Cog screening.   Mini-Cog Screening Orientation to Time - Max  5 pts Orientation to Place - Max 5 pts Registration - Max 3 pts Recall - Max 3 pts Language Repeat - Max 1 pts Language Follow 3 Step Command - Max 3 pts   Screening Tests Health Maintenance  Topic Date Due  . OPHTHALMOLOGY EXAM  07/31/2017 (Originally 04/19/2015)  . TETANUS/TDAP  10/31/2020 (Originally 09/29/2012)  . PNA vac Low Risk Adult (1 of 2 - PCV13) 10/31/2020 (Originally 06/25/2005)  . ZOSTAVAX  07/05/2021 (Originally 06/25/2000)  . INFLUENZA VACCINE  11/01/2023 (Originally 04/29/2016)  . FOOT EXAM  12/11/2016  . HEMOGLOBIN A1C  01/28/2017  . URINE MICROALBUMIN  09/15/2017        Plan:     I have personally reviewed and addressed the Medicare Annual Wellness questionnaire and have noted the following in the patient's chart:  A. Medical and social history B. Use of alcohol, tobacco or illicit drugs  C. Current medications and supplements D. Functional ability and status E.  Nutritional status F.  Physical activity G. Advance directives H. List of other physicians I.  Hospitalizations, surgeries, and ER visits in previous 12 months J.  [place] to include hearing, vision, cognitive, depression L. Referrals and appointments - none  In addition, I have reviewed and discussed with patient certain preventive protocols, quality metrics, and best practice recommendations. A written personalized care plan for preventive services as well as general preventive health recommendations were provided to patient.  See attached scanned questionnaire for additional information.    Signed,   Lindell Noe, MHA, BS, LPN Health Coach

## 2016-09-15 NOTE — Progress Notes (Signed)
Pre visit review using our clinic review tool, if applicable. No additional management support is needed unless otherwise documented below in the visit note. 

## 2016-09-15 NOTE — Telephone Encounter (Signed)
For that I need to get him referred to pulmonary (I think it has been a while if I am not wrong) Let me know if he is agreeable for a referral

## 2016-09-19 NOTE — Telephone Encounter (Signed)
Patient request to leave a detailed message. Patient returning phone call.

## 2016-09-19 NOTE — Telephone Encounter (Signed)
Called and spoke with pt and he is aware of RA recs.  He stated that he will call us back after the first of the year.  He has plenty of oxygen tanks at this time and does not need any of it until he gets up to walk.

## 2016-09-19 NOTE — Telephone Encounter (Signed)
Attempted to contact pt. No answer, no option to leave a message. Will try back.  

## 2016-09-25 ENCOUNTER — Telehealth: Payer: Self-pay | Admitting: *Deleted

## 2016-09-25 NOTE — Telephone Encounter (Signed)
Noted in chart that he declines health mt  Thanks

## 2016-09-25 NOTE — Telephone Encounter (Signed)
Called pt to confirm appt for a CPE tomorrow . Patient states that he doesn't need to come in for his CPE. He feels as though he is not sick and does not want to come in. He had his medicare wellness done with Katha Cabal already.  The appt is cancelled . Thank you .

## 2016-09-26 ENCOUNTER — Encounter: Payer: Medicare Other | Admitting: Family Medicine

## 2016-10-08 ENCOUNTER — Other Ambulatory Visit: Payer: Self-pay | Admitting: Family Medicine

## 2016-10-25 ENCOUNTER — Other Ambulatory Visit: Payer: Self-pay | Admitting: Family Medicine

## 2016-10-27 NOTE — Telephone Encounter (Signed)
No recent lipid labs and pt cancelled his CPE with you just saw Lattie Haw for his AWV, no future appts., please advise

## 2016-10-27 NOTE — Telephone Encounter (Signed)
Please schedule 30 min f/u with me in late April with labs prior and refill until then Thanks

## 2016-10-29 NOTE — Telephone Encounter (Signed)
appt scheduled and med filled  

## 2016-10-30 ENCOUNTER — Ambulatory Visit (INDEPENDENT_AMBULATORY_CARE_PROVIDER_SITE_OTHER): Payer: Medicare Other | Admitting: Internal Medicine

## 2016-10-30 ENCOUNTER — Encounter: Payer: Self-pay | Admitting: Internal Medicine

## 2016-10-30 VITALS — BP 130/80 | HR 101 | Wt 180.0 lb

## 2016-10-30 DIAGNOSIS — E1165 Type 2 diabetes mellitus with hyperglycemia: Secondary | ICD-10-CM

## 2016-10-30 DIAGNOSIS — Z794 Long term (current) use of insulin: Secondary | ICD-10-CM

## 2016-10-30 MED ORDER — INSULIN GLARGINE 100 UNITS/ML SOLOSTAR PEN: 20.0000 [IU] | PEN_INJECTOR | Freq: Every day | SUBCUTANEOUS | 2 refills | Status: DC

## 2016-10-30 NOTE — Progress Notes (Addendum)
Patient ID: Jonathan [name], male   DOB: 17-Oct-1940, 76 y.o.   MRN: [id number]  Jonathan [name] is a 76 y.o.-year-old male, returning for follow up management of DM2, dx 1990, uncontrolled, insulin-dep since ~1027, without complications (h/o stroke, also mild CKD). Last visit 3 mo ago.  Last hemoglobin A1c was: 07/31/2016: HbA1c calculated from the fructosamine is great, at 6.15%! Lab Results  Component Value Date   HGBA1C 7.9 07/31/2016   HGBA1C 7.9 04/29/2016   HGBA1C 7.4 12/04/2015   He is on: - Glipizide XL 10 mg in am 1/2 tablet (5 mg) 15 min before dinner. - Metformin 1000 mg 2x a day  - Lantus 25 units at bedtime He also has at home Humalog if FS>200 (uses this seldom)  Checks sugars 3x a day: - am: 82-135 >> 66, 87-123 >> 69, 81-121 >> 60, 67, 78-125 >> 84-111, 128 >> 85-123 >> 79-90s - 2h after b'fast: n/c - lunch:  67, 73-159 >> 79-153, 214 (snack) >> 98, 121-149, 157 >> 85-132, 155 >> 130-160 - 2h after lunch: n/c - dinner: n/c >> 110-141 >> 81-128 >> 88-133, 158 (late lunch) >> n/c - 2h after dinner: 89-200 >> 113-215 >> 109-175, 190, 217 >> 161-236 >> 155-239 (cake) >> n/c >> 160-170s - bedtime: n/c Highest: 178, Lowest 75. Sugars improved after stopping Lamictal.  He cannot exercise, due to SOB  He has Stage 2-3  CKD, last BUN/creatinine: Lab Results  Component Value Date   BUN 19 12/12/2015   CREATININE 1.24 12/12/2015   Lab Results  Component Value Date   GFR 60.33 12/12/2015   MICRALBCREAT 7.6 09/15/2016   Allergic to ACE-I and ARB. Last ACR: Component Date Value Ref Range  . Microalb, Ur 09/15/2016 10.5* 0.0 - 1.9 mg/dL  . Creatinine,U 09/15/2016 139.0  mg/dL  . Microalb Creat Ratio 09/15/2016 7.6  0.0 - 30.0 mg/g   - Last eye exam was in 2015. No DR - no numbness and tingling in feet. No known neuropathy.  - last TSH was  Lab Results  Component Value Date   TSH 3.58 12/12/2015   - Has HL: Lab Results  Component Value Date   CHOL 134  12/12/2015   HDL 33.70 (L) 12/12/2015   LDLCALC 58 09/01/2014   LDLDIRECT 75.0 12/12/2015   TRIG 239.0 (H) 12/12/2015   CHOLHDL 4 12/12/2015  On Lipitor 10. Tolerating it well.   - on ASA 81  Still mokes 1 PPD.  * Around early 2015, he was hospitalized after syncope, was in ICU, was on assisted ventilation, expressive aphasia recovered- believed to have a stroke, now better.    ROS: Constitutional: no weight gain/loss, no fatigue, no subjective hyperthermia/hypothermia, no nocturia Eyes: no blurry vision, no xerophthalmia ENT: no sore throat, no nodules palpated in throat, no dysphagia/odynophagia, no hoarseness, + hypoacusis Cardiovascular: no CP/+ SOB/no palpitations/leg swelling Respiratory: no cough/+ SOB/+ wheezing Gastrointestinal: no N/V/D/C Musculoskeletal: no muscle/no joint aches Skin: no rashes Neurological: no tremors/no numbness/tingling/dizziness, no HAs  I reviewed pt's medications, allergies, PMH, social hx, family hx, and changes were documented in the history of present illness. Otherwise, unchanged from my initial visit note.  Past Medical History:  Diagnosis Date  . Allergic rhinitis   . Asthma   . COPD (chronic obstructive pulmonary disease) ([place])   . Dermatitis seborrheica   . Diabetes mellitus type II   . ED (erectile dysfunction)   . Frozen shoulder   . HLD (hyperlipidemia)   . HTN (hypertension)   .  Hyperkalemia   . Kidney stone   . Other diseases of lung, not elsewhere classified   . Pulmonary nodule, right    lower lobe  . Rotator cuff injury    right  . Shoulder pain   . Stroke [facility])    pt states "last year"  . Tobacco abuse    Past Surgical History:  Procedure Laterality Date  . CERVICAL DISCECTOMY  1998  . EEG    . ETT  11/1994  . TEE WITHOUT CARDIOVERSION  12/03/2011   Procedure: TRANSESOPHAGEAL ECHOCARDIOGRAM (TEE);  Surgeon: Lelon Perla, MD;  Location[facility]COPY;  Service: Cardiovascular;  Laterality: N/A;  . TM repair      Social History   Social History  . Marital status: Married    Spouse name: N/A  . Number of children: N/A  . Years of education: N/A   Occupational History  . Retired     Air cabin crew   Social History Main Topics  . Smoking status: Current Every Day Smoker    Packs/day: 1.00    Types: Cigarettes  . Smokeless tobacco: Never Used  . Alcohol use No     Comment: Quit 40 years ago  . Drug use: No  . Sexual activity: No   Other Topics Concern  . Not on file   Social History Narrative  . No narrative on file   Current Outpatient Prescriptions on File Prior to Visit  Medication Sig Dispense Refill  . ACCU-CHEK AVIVA PLUS test strip USE TO CHECK BLOOD GLUCOSE LEVELS 3 TO 4TIMES DAILY 200 each 2  . albuterol (PROVENTIL) (2.5 MG/3ML) 0.083% nebulizer solution Take 3 mLs (2.5 mg total) by nebulization 2 (two) times daily as needed for shortness of breath. (Patient not taking: Reported on 09/15/2016) 75 mL 0  . albuterol (VENTOLIN HFA) 108 (90 BASE) MCG/ACT inhaler Inhale 2 puffs into the lungs every 6 (six) hours as needed for wheezing. 1 Inhaler 11  . amLODipine (NORVASC) 5 MG tablet TAKE 1 TABLET BY MOUTH  DAILY 90 tablet 2  . aspirin 81 MG tablet Take 1 tablet (81 mg total) by mouth daily. 30 tablet 0  . atorvastatin (LIPITOR) 10 MG tablet TAKE 1 TABLET BY MOUTH  DAILY 90 tablet 0  . Blood Glucose Monitoring Suppl (ACCU-CHEK AVIVA PLUS) W/DEVICE KIT USE AS DIRECTED 1 kit 0  . glipiZIDE (GLUCOTROL XL) 10 MG 24 hr tablet Take 1 tablet in the morning and half a tablet before dinner 135 tablet 1  . GLOBAL EASE INJECT PEN NEEDLES 32G X 4 MM MISC AS DIRECTED 100 each 2  . insulin glargine (LANTUS) 100 unit/mL SOPN Inject 0.25 mLs (25 Units total) into the skin daily at 8 pm. 15 mL 2  . lamoTRIgine (LAMICTAL) 100 MG tablet Take 2 tablets by mouth  once a day (Patient not taking: Reported on 09/15/2016) 60 tablet 0  . metFORMIN (GLUCOPHAGE) 1000 MG tablet TAKE 1 TABLET BY MOUTH IN  THE  MORNING, ONE-HALF  TABLET MIDDAY AND 1 TABLET  IN THE EVENING 200 tablet 1   No current facility-administered medications on file prior to visit.    Allergies  Allergen Reactions  . Amoxicillin-Pot Clavulanate Other (See Comments)    Severe stomach pain.   . Quinapril Hcl     REACTION: back pain  . Valsartan     REACTION: back pain  . Varenicline Tartrate     REACTION: AMS, hallucinations, night mares   Family History  Problem Relation Age  of Onset  . Asthma Sister   . Emphysema Sister   . Diabetes Mother   . Diabetes Brother   . Heart disease Brother    PE: BP 130/80 (BP Location: Left Arm, Patient Position: Sitting)   Pulse (!) 101   Wt 180 lb (81.6 kg)   SpO2 95%   BMI 29.05 kg/m  Body mass index is 29.05 kg/m. Wt Readings from Last 3 Encounters:  10/30/16 180 lb (81.6 kg)  09/15/16 180 lb 8 oz (81.9 kg)  07/31/16 180 lb (81.6 kg)   Constitutional: overweight, in NAD, strong cigarette smell, walks with cane but had to use our wheelchair b/c disequilibrium Eyes: PERRLA, EOMI, no exophthalmos ENT: moist mucous membranes, no thyromegaly, no cervical lymphadenopathy Cardiovascular: RRR, No MRG Respiratory: CTA B, no wheezes Gastrointestinal: abdomen soft, NT, ND, BS+ Musculoskeletal: no deformities, strength intact in all 4 Skin: moist, warm, no rashes Neurological: no tremor with outstretched hands, DTR normal in all 4  ASSESSMENT: 1. DM2, insulin-dependent, uncontrolled, with complications - h/o stroke  PLAN:  1. Patient with long-standing, uncontrolled diabetes, with improved control lately due to decreased portions. He is on an oral antidiabetic regimen + basal insulin. He has low sugars in am >> will decrease insulin dose. He has higher sugars midday but he skips lunch and I believe the higher sugars are b/c occasional snacks/grazing. No other changes needed for now. - I suggested to:  Patient Instructions  Please continue: - Glipizide XL 10 mg in am (and  1/2 tablet (5 mg) 15 min before a larger dinner) - Metformin 1000 mg 2x a day  Please decrease: - Lantus 20 units at bedtime  Please stop at the lab.  Please return in 3 months with your sugar log.  - continue checking sugars at different times of the day - check 1-2 times a day, rotating checks - advised for yearly eye exams >> he needs one >> advised to schedule - will check a fructosamine today - Return to clinic in 3 mo with sugar log   Office Visit on 10/30/2016  Component Date Value Ref Range Status  . Fructosamine 10/30/2016 290* 190 - 270 umol/L Final   HbA1c calculated from the fructosamine is great, at 6.5%!  Philemon Kingdom, MD PhD [facility] Endocrinology

## 2016-10-30 NOTE — Patient Instructions (Addendum)
Please continue: - Glipizide XL 10 mg in am (and 1/2 tablet (5 mg) 15 min before a larger dinner) - Metformin 1000 mg 2x a day  Please decrease: - Lantus 20 units at bedtime  Please stop at the lab.  Please return in 3 months with your sugar log.

## 2016-11-03 LAB — FRUCTOSAMINE: Fructosamine: 290 umol/L — ABNORMAL HIGH (ref 190–270)

## 2016-11-04 ENCOUNTER — Telehealth: Payer: Self-pay

## 2016-11-04 NOTE — Telephone Encounter (Signed)
Called patient and gave lab results. Patient had no questions or concerns.  

## 2016-11-04 NOTE — Telephone Encounter (Signed)
-----   Message from Philemon Kingdom, MD sent at 11/03/2016  2:56 PM EST ----- [name], can you please call pt: HbA1c calculated from the fructosamine is great, at 6.1%!

## 2016-11-11 ENCOUNTER — Encounter: Payer: Self-pay | Admitting: Neurology

## 2016-11-11 ENCOUNTER — Ambulatory Visit (INDEPENDENT_AMBULATORY_CARE_PROVIDER_SITE_OTHER): Payer: Medicare Other | Admitting: Neurology

## 2016-11-11 VITALS — BP 128/70 | HR 109 | Ht 66.0 in | Wt 181.1 lb

## 2016-11-11 DIAGNOSIS — Z8673 Personal history of transient ischemic attack (TIA), and cerebral infarction without residual deficits: Secondary | ICD-10-CM | POA: Diagnosis not present

## 2016-11-11 DIAGNOSIS — I679 Cerebrovascular disease, unspecified: Secondary | ICD-10-CM | POA: Diagnosis not present

## 2016-11-11 DIAGNOSIS — G40209 Localization-related (focal) (partial) symptomatic epilepsy and epileptic syndromes with complex partial seizures, not intractable, without status epilepticus: Secondary | ICD-10-CM

## 2016-11-11 NOTE — Progress Notes (Signed)
NEUROLOGY FOLLOW UP OFFICE NOTE  EOIN WILLDEN [id number]  HISTORY OF PRESENT ILLNESS: I had the pleasure of seeing Alvis Edgell in follow-up in the neurology clinic on 11/11/2016. The patient was last seen 6 months ago for recurrent episodes of confusion, concerning for seizures. He is again accompanied by his caregiver who helps supplement the history. On his last visit, he denied having any further episodes since March 2015 and wanted to discontinue Lamotrigine. His repeat 1-hour EEG was normal and he was tapered off medication. He denies any recurrence of confusional episodes. He denies any speech difficulties, no staring/unresponsive episodes, gaps in time, olfactory/gustatory hallucinations, focal numbness/tingling/weakness, myoclonic jerks. He has chronic back pain and occasional vertigo. His caregiver is concerned about possible dementia, she reports confusion with names, forgetfulness, and temper changes that have been ongoing for years. He sits on the couch "99.999%" of the time. He continues to smoke and does not use his oxygen. He denies any missed bills or medications, but takes his medications at different times of the day because he would forget. He states "I just don't do it." He does not drive. No falls. He ambulates with a cane.  HPI 02/27/14: This is a pleasant 76 yo RH man with a history of hypertension, hyperlipidemia, diabetes, COPD, stroke in 2013, who presented for recurrent episodes of confusion. He went to the ER on 11/23/13 for productive cough and hypoxia, and was admitted for 3 days for COPD exacerbation. His wife reports that he appeared confused that day, he did not seem to comprehend what she was saying, with a blank stare, and could not get his shirt on. He was discharged home on home O2, and they report that he continued to have some difficulty breathing and felt diffusely weak. On 11/28/13, his wife heard a fall and found him halfway in the bathroom tub, incoherent with  eyes open, unresponsive to questions. He had bladder and bowel incontinence and was apparently agitated and fighting EMS. He does not recall this, and does not recall much of his 10-day hospitalization with diagnosis of acute on chronic respiratory failure. He was discharged home, then again returned on 12/14/13 when home PT noted confusion and agitation. His wife reports that he was confused and his attitude changes ("so mean"), he was telling PT to get out of their house. In the ER, he was evaluated by neurologist Dr. 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 CT head and MRI brain which I personally reviewed, no evidence of acute infarct. There was extensive chronic small vessel changes and small old right occipital cortical infarction. His MRA showed atherosclerotic narrowing and irregularity of both carotid siphon regions, severely diseased right A1 segment which does not definitely show flow, both anterior cerebral arteries are appear well supplied from the left carotid system. Severely diseased left vertebral artery. The right vertebral artery is dominant and widely patent to the basilar. Posterior circulation branch vessels are patent but show atherosclerotic irregularity. His routine EEG showed focal delta and theta slowing most prominent at F7, T7. He was started on Keppra 560m BID.   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 TEE negative. His brother had one seizure at age 77 Otherwise he had a normal birth and early development. There is no history of febrile convulsions, CNS infections such as meningitis/encephalitis, significant traumatic brain injury, neurosurgical procedures.   PAST MEDICAL HISTORY:  Past Medical History:  Diagnosis Date  . Allergic rhinitis   . Asthma   . COPD (chronic obstructive pulmonary disease) ([place])   . Dermatitis seborrheica   .  Diabetes mellitus type II   . ED (erectile dysfunction)   . Frozen shoulder   . HLD (hyperlipidemia)   . HTN (hypertension)   . Hyperkalemia   . Kidney stone   . Other diseases of lung, not elsewhere classified   . Pulmonary nodule, right    lower lobe  . Rotator cuff injury    right  . Shoulder pain   . Stroke [facility])    pt states "last year"  . Tobacco abuse     MEDICATIONS:  Outpatient Encounter Prescriptions as of 11/11/2016  Medication Sig Note  . ACCU-CHEK AVIVA PLUS test strip USE TO CHECK BLOOD GLUCOSE LEVELS 3 TO 4TIMES DAILY   . albuterol (VENTOLIN HFA) 108 (90 BASE) MCG/ACT inhaler Inhale 2 puffs into the lungs every 6 (six) hours as needed for wheezing.   [name] amLODipine (NORVASC) 5 MG tablet TAKE 1 TABLET BY MOUTH  DAILY   . aspirin 81 MG tablet Take 1 tablet (81 mg total) by mouth daily.   [name] atorvastatin (LIPITOR) 10 MG tablet TAKE 1 TABLET BY MOUTH  DAILY   . Blood Glucose Monitoring Suppl (ACCU-CHEK AVIVA PLUS) W/DEVICE KIT USE AS DIRECTED   . glipiZIDE (GLUCOTROL XL) 10 MG 24 hr tablet Take 1 tablet in the morning and half a tablet before dinner   . GLOBAL EASE INJECT PEN NEEDLES 32G X 4 MM MISC AS DIRECTED   . insulin glargine (LANTUS) 100 unit/mL SOPN Inject 0.2 mLs (20 Units total) into the skin daily at 8 pm.   . metFORMIN (GLUCOPHAGE) 1000 MG tablet TAKE 1 TABLET BY MOUTH IN  THE MORNING, ONE-HALF  TABLET MIDDAY AND 1 TABLET  IN THE EVENING   . albuterol (PROVENTIL) (2.5 MG/3ML) 0.083% nebulizer solution Take 3 mLs (2.5 mg total) by nebulization 2 (two) times daily as needed for shortness of breath. (Patient not taking: Reported on 09/15/2016) 09/15/2016: Pt needs refill  . lamoTRIgine (LAMICTAL) 100 MG tablet Take 2 tablets by mouth  once a day (Patient not taking: Reported on 09/15/2016)    No facility-administered encounter medications on file as of 11/11/2016.    ALLERGIES: Allergies  Allergen Reactions  . Amoxicillin-Pot Clavulanate Other (See Comments)     Severe stomach pain.   . Quinapril Hcl     REACTION: back pain  . Valsartan     REACTION: back pain  . Varenicline Tartrate     REACTION: AMS, hallucinations, night mares    FAMILY HISTORY: Family History  Problem Relation Age of Onset  . Asthma Sister   . Emphysema Sister   . Diabetes Mother   . Diabetes Brother   . Heart disease Brother     SOCIAL HISTORY: Social History   Social History  . Marital status: Married    Spouse name: N/A  . Number of children: N/A  . Years of education: N/A   Occupational History  . Retired     Air cabin crew   Social History Main Topics  . Smoking status: Current Every Day Smoker    Packs/day: 1.00    Types: Cigarettes  . Smokeless tobacco: Never Used  . Alcohol use No     Comment: Quit 40 years ago  . Drug use: No  . Sexual activity: No   Other Topics Concern  . Not  on file   Social History Narrative  . No narrative on file    REVIEW OF SYSTEMS: Constitutional: No fevers, chills, or sweats, no generalized fatigue, change in appetite Eyes: No visual changes, double vision, eye pain Ear, nose and throat: No hearing loss, ear pain, nasal congestion, sore throat Cardiovascular: No chest pain, palpitations Respiratory:  No shortness of breath at rest or with exertion, wheezes GastrointestinaI: No nausea, vomiting, diarrhea, abdominal pain, fecal incontinence Genitourinary:  No dysuria, urinary retention or frequency Musculoskeletal:  No neck pain, +back pain Integumentary: No rash, pruritus, skin lesions Neurological: as above Psychiatric: No depression, insomnia, anxiety Endocrine: No palpitations, fatigue, diaphoresis, mood swings, change in appetite, change in weight, increased thirst Hematologic/Lymphatic:  No anemia, purpura, petechiae. Allergic/Immunologic: no itchy/runny eyes, nasal congestion, recent allergic reactions, rashes  PHYSICAL EXAM: Vitals:   11/11/16 1537  BP: 128/70  Pulse: (!) 109   General: No  acute distress Head:  Normocephalic/atraumatic Neck: supple, no paraspinal tenderness, full range of motion Heart:  Regular rate and rhythm Lungs:  Clear to auscultation bilaterally Back: No paraspinal tenderness Skin/Extremities: No rash, no edema Neurological Exam: alert and oriented to person, place, and time except for date, no dysarthria or aphasia, Fund of knowledge is appropriate. Recent and remote memory are intact. Attention and concentration are normal. Able to name objects and repeat phrases. CDT 5/5  MMSE - Mini Mental State Exam 11/11/2016 09/15/2016  Orientation to time 4 5  Orientation to Place 5 5  Registration 3 3  Attention/ Calculation 5 0  Recall 3 2  Recall-comments - pt was unable to recall 1 of 3 words  Language- name 2 objects 2 0  Language- repeat 1 1  Language- follow 3 step command 3 3  Language- read & follow direction 1 0  Write a sentence 1 0  Copy design 1 0  Total score 29 19   Cranial nerves:  CN I: not tested  CN II: pupils equal, round and reactive to light, visual fields intact, fundi unremarkable.  CN III, IV, VI: full range of motion, no nystagmus, no ptosis  CN V: facial sensation intact  CN VII: upper and lower face symmetric  CN VIII: hearing intact to finger rub  CN IX, X: gag intact, uvula midline  CN XI: sternocleidomastoid and trapezius muscles intact  CN XII: tongue midline  Bulk & Tone: normal, no cogwheeling, no fasciculations.  Motor: 5/5 throughout with no pronator drift.  Sensation: Intact to LT. No extinction to double simultaneous stimulation. 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 He has been event-free for 2 years and wanted to discontinue Lamotrigine. Repeat EEG was normal. He has tapered off medication with no recurrence of confusional episodes. His caregiver is concerned about memory changes, MMSE  today 29/30 (19/30 in December 2017). He seems to have good and bad days. Continue to monitor with PCP. We discussed smoking cessation, particularly with intracranial stenosis, and maximum medical management with control of vascular risk factors (hypertension, diabetes, hyperlipidemia, and smoking cessation). He will follow-up on an as needed basis and knows to go to the ER if symptoms recur or progress. He does not drive and is aware of [place] driving laws with regards to seizures.   Thank you for allowing me to participate in his care.  Please do not hesitate to call for any questions or concerns.  The duration of this appointment visit was 25 minutes of face-to-face time with the patient.  Greater than 50% of this time was spent in counseling, explanation of diagnosis, planning of further management, and coordination of care.   Ellouise Newer, M.D.   CC: Dr. Glori Bickers

## 2016-11-11 NOTE — Patient Instructions (Signed)
1. Continue all your medications, continue control of blood pressure, cholesterol, diabetes 2. Smoking cessation would be advisable 3. Follow-up on as needed basis, call for any changes  Seizure Precautions: 1. If medication has been prescribed for you to prevent seizures, take it exactly as directed.  Do not stop taking the medicine without talking to your doctor first, even if you have not had a seizure in a long time.   2. Avoid activities in which a seizure would cause danger to yourself or to others.  Don't operate dangerous machinery, swim alone, or climb in high or dangerous places, such as on ladders, roofs, or girders.  Do not drive unless your doctor says you may.  3. If you have any warning that you may have a seizure, lay down in a safe place where you can't hurt yourself.    4.  No driving for 6 months from last seizure, as per [facility].   Please refer to the following link on the [place] website for more information: http://[email or web address].org/[email or web address]/driving/drivingu.cfm   5.  Maintain good sleep hygiene. Avoid alcohol.  6.  Contact your doctor if you have any problems that may be related to the medicine you are taking.  7.  Call 911 and bring the patient back to the ED if:        A.  The seizure lasts longer than 5 minutes.       B.  The patient doesn't awaken shortly after the seizure  C.  The patient has new problems such as difficulty seeing, speaking or moving  D.  The patient was injured during the seizure  E.  The patient has a temperature over 102 F (39C)  F.  The patient vomited and now is having trouble breathing

## 2016-11-18 ENCOUNTER — Ambulatory Visit: Payer: Medicare Other

## 2016-11-24 ENCOUNTER — Encounter: Payer: Medicare Other | Admitting: Family Medicine

## 2016-11-25 ENCOUNTER — Ambulatory Visit (INDEPENDENT_AMBULATORY_CARE_PROVIDER_SITE_OTHER): Payer: Medicare Other | Admitting: Family Medicine

## 2016-11-25 ENCOUNTER — Encounter: Payer: Self-pay | Admitting: Family Medicine

## 2016-11-25 VITALS — BP 126/68 | HR 98 | Temp 97.5°F | Ht 66.0 in | Wt 179.5 lb

## 2016-11-25 DIAGNOSIS — J9611 Chronic respiratory failure with hypoxia: Secondary | ICD-10-CM | POA: Diagnosis not present

## 2016-11-25 DIAGNOSIS — E1165 Type 2 diabetes mellitus with hyperglycemia: Secondary | ICD-10-CM

## 2016-11-25 DIAGNOSIS — H6123 Impacted cerumen, bilateral: Secondary | ICD-10-CM | POA: Diagnosis not present

## 2016-11-25 DIAGNOSIS — Z794 Long term (current) use of insulin: Secondary | ICD-10-CM

## 2016-11-25 DIAGNOSIS — Z0000 Encounter for general adult medical examination without abnormal findings: Secondary | ICD-10-CM | POA: Diagnosis not present

## 2016-11-25 DIAGNOSIS — E7800 Pure hypercholesterolemia, unspecified: Secondary | ICD-10-CM

## 2016-11-25 DIAGNOSIS — G40209 Localization-related (focal) (partial) symptomatic epilepsy and epileptic syndromes with complex partial seizures, not intractable, without status epilepticus: Secondary | ICD-10-CM

## 2016-11-25 DIAGNOSIS — D72829 Elevated white blood cell count, unspecified: Secondary | ICD-10-CM

## 2016-11-25 DIAGNOSIS — I10 Essential (primary) hypertension: Secondary | ICD-10-CM | POA: Diagnosis not present

## 2016-11-25 DIAGNOSIS — Z8619 Personal history of other infectious and parasitic diseases: Secondary | ICD-10-CM | POA: Diagnosis not present

## 2016-11-25 DIAGNOSIS — H6120 Impacted cerumen, unspecified ear: Secondary | ICD-10-CM | POA: Insufficient documentation

## 2016-11-25 DIAGNOSIS — Z720 Tobacco use: Secondary | ICD-10-CM | POA: Diagnosis not present

## 2016-11-25 DIAGNOSIS — J449 Chronic obstructive pulmonary disease, unspecified: Secondary | ICD-10-CM

## 2016-11-25 LAB — COMPREHENSIVE METABOLIC PANEL
ALT: 27 U/L (ref 0–53)
AST: 22 U/L (ref 0–37)
Albumin: 4.2 g/dL (ref 3.5–5.2)
Alkaline Phosphatase: 76 U/L (ref 39–117)
BUN: 15 mg/dL (ref 6–23)
CO2: 29 mEq/L (ref 19–32)
Calcium: 9.2 mg/dL (ref 8.4–10.5)
Chloride: 96 mEq/L (ref 96–112)
Creatinine, Ser: 1.03 mg/dL (ref 0.40–1.50)
GFR: 74.54 mL/min (ref >60.00)
Glucose, Bld: 193 mg/dL — ABNORMAL HIGH (ref 70–99)
Potassium: 4.4 mEq/L (ref 3.5–5.1)
Sodium: 133 mEq/L — ABNORMAL LOW (ref 135–145)
Total Bilirubin: 1.2 mg/dL (ref 0.2–1.2)
Total Protein: 7.3 g/dL (ref 6.0–8.3)

## 2016-11-25 LAB — CBC WITH DIFFERENTIAL/PLATELET
Basophils Absolute: 0.1 10*3/uL (ref 0.0–0.1)
Basophils Relative: 0.4 % (ref 0.0–3.0)
Eosinophils Absolute: 0.3 10*3/uL (ref 0.0–0.7)
Eosinophils Relative: 2 % (ref 0.0–5.0)
HCT: 50.4 % (ref 39.0–52.0)
Hemoglobin: 16.8 g/dL (ref 13.0–17.0)
Lymphocytes Relative: 21.2 % (ref 12.0–46.0)
Lymphs Abs: 2.7 10*3/uL (ref 0.7–4.0)
MCHC: 33.3 g/dL (ref 30.0–36.0)
MCV: 85.2 fl (ref 78.0–100.0)
Monocytes Absolute: 0.8 10*3/uL (ref 0.1–1.0)
Monocytes Relative: 6.2 % (ref 3.0–12.0)
Neutro Abs: 8.8 10*3/uL — ABNORMAL HIGH (ref 1.4–7.7)
Neutrophils Relative %: 70.2 % (ref 43.0–77.0)
Platelets: 294 10*3/uL (ref 150.0–400.0)
RBC: 5.91 Mil/uL — ABNORMAL HIGH (ref 4.22–5.81)
RDW: 14.8 % (ref 11.5–15.5)
WBC: 12.6 10*3/uL — ABNORMAL HIGH (ref 4.0–10.5)

## 2016-11-25 LAB — LIPID PANEL
Cholesterol: 181 mg/dL (ref 0–200)
HDL: 41.7 mg/dL (ref >39.00)
NonHDL: 139.36
Total CHOL/HDL Ratio: 4
Triglycerides: 276 mg/dL — ABNORMAL HIGH (ref 0.0–149.0)
VLDL: 55.2 mg/dL — ABNORMAL HIGH (ref 0.0–40.0)

## 2016-11-25 LAB — TSH: TSH: 2.98 u[IU]/mL (ref 0.35–4.50)

## 2016-11-25 LAB — LDL CHOLESTEROL, DIRECT: Direct LDL: 106 mg/dL

## 2016-11-25 MED ORDER — ATORVASTATIN CALCIUM 10 MG PO TABS: 10.0000 mg | ORAL_TABLET | Freq: Every day | ORAL | 3 refills | Status: DC

## 2016-11-25 NOTE — Assessment & Plan Note (Signed)
Disc in detail risks of smoking and possible outcomes including copd, vascular/ heart disease, cancer , respiratory and sinus infections  Pt voices understanding Despite copd and other health problems- he is not yet motivated to quit  Enc him to reach out if he needs help in the future

## 2016-11-25 NOTE — Assessment & Plan Note (Signed)
bp in fair control at this time  BP Readings from Last 1 Encounters:  11/25/16 126/68   No changes needed Disc lifstyle change with low sodium diet and exercise  Labs today

## 2016-11-25 NOTE — Assessment & Plan Note (Signed)
bilat  Recommend debrox 2 times weekly for 1-2 mo and then he can attempt to irrigate or come here for that  This will impact hearing

## 2016-11-25 NOTE — Assessment & Plan Note (Signed)
Still has some scarring  Not bothersome  Declines zoster imm

## 2016-11-25 NOTE — Patient Instructions (Addendum)
Don't forget to make your eye exam appointment  We will sign you up for cologuard program for colon screening  Labs today  For ear wax - get debrox or other ear wax dissolving drops over the counter and use them as directed at least 2 times weekly for a month or two before you try to flush your ears  You can also make appt. Here to flush ears as well if needed  Keep watching diet  Stay as active as you can  Continue seeing the pulmonary doctor   Take care of yourself

## 2016-11-25 NOTE — Assessment & Plan Note (Signed)
With copd  Sees pulmonary  Most of sob is on exertion  Continues to smoke

## 2016-11-25 NOTE — Progress Notes (Signed)
Pre visit review using our clinic review tool, if applicable. No additional management support is needed unless otherwise documented below in the visit note. 

## 2016-11-25 NOTE — Assessment & Plan Note (Signed)
Doing better-seeing endocrinology  Po intake is down/ appetite is lower than in the past  Unable to exercise He will make opthy exam  Disc eye and foot care

## 2016-11-25 NOTE — Assessment & Plan Note (Signed)
Disc goals for lipids and reasons to control them Rev labs with pt (3/17) Rev low sat fat diet in detail  On statin  Diet is better

## 2016-11-25 NOTE — Assessment & Plan Note (Signed)
Continues to see pulmonary  Sob on exertion baseline-limits activity Continues to smoke

## 2016-11-25 NOTE — Assessment & Plan Note (Signed)
Reviewed health habits including diet and exercise and skin cancer prevention Reviewed appropriate screening tests for age  Also reviewed health mt list, fam hx and immunization status , as well as social and family history   See HPI AMW reviewed Labs drawn for wellness Declines hearing aides Rev memory testing  Declines immunizations of any kind  cologuard ordered for colon screening  Declines dexa (high risk of OP with smoking)  Aged out of prostate screening

## 2016-11-25 NOTE — Progress Notes (Signed)
Subjective:    Patient ID: Jonathan [name], male    DOB: 1941/08/23, 76 y.o.   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 - he thinks he can hear fairly - not open to hearing aides though his family wishes he would get one  Mini cog 19/20 (missed one recall)  No concerns about memory - was also tested from neurology   Wt Readings from Last 3 Encounters:  11/25/16 179 lb 8 oz (81.4 kg)  11/11/16 181 lb 2 oz (82.2 kg)  10/30/16 180 lb (81.6 kg)  working on smaller portions Is unable to exercise  bmi 28.9  Declines immunizations   Colonoscopy/ screening - long time ago  No family history  Wants to do the cologuard program   dexa -never had one  Declines dexa  He has no fractures  No falls in the past year   Prostate cancer screening   No results found for: PSA No family hx  No nocturia No flow issues   Eye exam - is overdue  Last a few years ago   Smoking status - still smoking -no change / 1ppd   COPD- sees pulmonary -Dr Elsworth Soho  Most sob with exertion   DM2-sees endocrinologist Lab Results  Component Value Date   HGBA1C 7.9 07/31/2016   Then A1C calculated from fructosamine was actually 6.5 !- very excited Less of an appetite lately  Smaller portions    bp is stable today  No cp or palpitations or headaches or edema  No side effects to medicines  BP Readings from Last 3 Encounters:  11/25/16 126/68  11/11/16 128/70  10/30/16 130/80      Chemistry      Component Value Date/Time   NA 138 12/12/2015 1601   K 4.8 12/12/2015 1601   CL 99 12/12/2015 1601   CO2 29 12/12/2015 1601   BUN 19 12/12/2015 1601   CREATININE 1.24 12/12/2015 1601      Component Value Date/Time   CALCIUM 9.9 12/12/2015 1601   ALKPHOS 84 12/12/2015 1601   AST 18 12/12/2015 1601   ALT 11 12/12/2015 1601   BILITOT 0.6 12/12/2015 1601      Lab  Results  Component Value Date   TSH 3.58 12/12/2015      Hx of seizure disorder   Hx of leukocytosis in the past  Lab Results  Component Value Date   WBC 11.5 (H) 12/12/2015   HGB 16.6 12/12/2015   HCT 50.0 12/12/2015   MCV 84.5 12/12/2015   PLT 342.0 12/12/2015    Hx of hyperlipidemia with cva in the past Lab Results  Component Value Date   CHOL 134 12/12/2015   HDL 33.70 (L) 12/12/2015   LDLCALC 58 09/01/2014   LDLDIRECT 75.0 12/12/2015   TRIG 239.0 (H) 12/12/2015   CHOLHDL 4 12/12/2015   Atorvastatin  Due for labs    Patient Active Problem List   Diagnosis Date Noted  . Cerumen impaction 11/25/2016  . Routine general medical examination at a health care facility 11/25/2016  . Localization-related symptomatic epilepsy and epileptic syndromes with complex partial seizures, not intractable, without status epilepticus ([place]) 05/06/2016  . Intracranial vascular stenosis 05/06/2016  . History of stroke 05/06/2016  . Type 2 diabetes mellitus with hyperglycemia, with long-term current use of insulin ([place]) 12/04/2015  .  History of shingles 09/08/2014  . Medically noncompliant 05/05/2014  . Leukocytosis 02/03/2014  . Chronic respiratory failure ([place]) 12/15/2013  . Syncope 11/28/2013  . Immunization not carried out because of patient refusal 07/05/2012  . HLD (hyperlipidemia)   . Pulmonary nodule, right   . Allergic rhinitis   . ED (erectile dysfunction)   . Tobacco abuse   . COPD (chronic obstructive pulmonary disease) ([place]) 03/29/2010  . Essential hypertension 01/05/2007   Past Medical History:  Diagnosis Date  . Allergic rhinitis   . Asthma   . COPD (chronic obstructive pulmonary disease) ([place])   . Dermatitis seborrheica   . Diabetes mellitus type II   . ED (erectile dysfunction)   . Frozen shoulder   . HLD (hyperlipidemia)   . HTN (hypertension)   . Hyperkalemia   . Kidney stone   . Other diseases of lung, not elsewhere classified   . Pulmonary nodule,  right    lower lobe  . Rotator cuff injury    right  . Shoulder pain   . Stroke [facility])    pt states "last year"  . Tobacco abuse    Past Surgical History:  Procedure Laterality Date  . CERVICAL DISCECTOMY  1998  . EEG    . ETT  11/1994  . TEE WITHOUT CARDIOVERSION  12/03/2011   Procedure: TRANSESOPHAGEAL ECHOCARDIOGRAM (TEE);  Surgeon: Lelon Perla, MD;  Location: [facility] ENDOSCOPY;  Service: Cardiovascular;  Laterality: N/A;  . TM repair     Social History  Substance Use Topics  . Smoking status: Current Every Day Smoker    Packs/day: 1.00    Types: Cigarettes  . Smokeless tobacco: Never Used  . Alcohol use No     Comment: Quit 40 years ago   Family History  Problem Relation Age of Onset  . Asthma Sister   . Emphysema Sister   . Diabetes Mother   . Diabetes Brother   . Heart disease Brother    Allergies  Allergen Reactions  . Amoxicillin-Pot Clavulanate Other (See Comments)    Severe stomach pain.   . Quinapril Hcl     REACTION: back pain  . Valsartan     REACTION: back pain  . Varenicline Tartrate     REACTION: AMS, hallucinations, night mares   Current Outpatient Prescriptions on File Prior to Visit  Medication Sig Dispense Refill  . ACCU-CHEK AVIVA PLUS test strip USE TO CHECK BLOOD GLUCOSE LEVELS 3 TO 4TIMES DAILY 200 each 2  . albuterol (PROVENTIL) (2.5 MG/3ML) 0.083% nebulizer solution Take 3 mLs (2.5 mg total) by nebulization 2 (two) times daily as needed for shortness of breath. 75 mL 0  . albuterol (VENTOLIN HFA) 108 (90 BASE) MCG/ACT inhaler Inhale 2 puffs into the lungs every 6 (six) hours as needed for wheezing. 1 Inhaler 11  . amLODipine (NORVASC) 5 MG tablet TAKE 1 TABLET BY MOUTH  DAILY 90 tablet 2  . aspirin 81 MG tablet Take 1 tablet (81 mg total) by mouth daily. 30 tablet 0  . Blood Glucose Monitoring Suppl (ACCU-CHEK AVIVA PLUS) W/DEVICE KIT USE AS DIRECTED 1 kit 0  . glipiZIDE (GLUCOTROL XL) 10 MG 24 hr tablet Take 1 tablet in the morning and half  a tablet before dinner 135 tablet 1  . GLOBAL EASE INJECT PEN NEEDLES 32G X 4 MM MISC AS DIRECTED 100 each 2  . insulin glargine (LANTUS) 100 unit/mL SOPN Inject 0.2 mLs (20 Units total) into the skin daily at 8 pm. 15  mL 2  . metFORMIN (GLUCOPHAGE) 1000 MG tablet TAKE 1 TABLET BY MOUTH IN  THE MORNING, ONE-HALF  TABLET MIDDAY AND 1 TABLET  IN THE EVENING 200 tablet 1   No current facility-administered medications on file prior to visit.      Review of Systems Review of Systems  Constitutional: Negative for fever, appetite change and unexpected weight change.  Eyes: Negative for pain and visual disturbance.  Respiratory: pos  for cough and shortness of breath. Pos for wheeze on and off   Cardiovascular: Negative for cp or palpitations    Gastrointestinal: Negative for nausea, diarrhea and constipation.  Genitourinary: Negative for urgency and frequency.  Skin: Negative for pallor or rash   MSK pos for chronic back pain / joint pain  Neurological: Negative for weakness, light-headedness, numbness and headaches. neg for recent seizures  Hematological: Negative for adenopathy. Does  Bruise  easily.  Psychiatric/Behavioral: Negative for dysphoric mood. The patient is not nervous/anxious.         Objective:   Physical Exam  Constitutional: He appears well-developed and well-nourished. No distress.  Frail appearing elderly male  HENT:  Head: Normocephalic and atraumatic.  Right Ear: External ear normal.  Left Ear: External ear normal.  Nose: Nose normal.  Mouth/Throat: Oropharynx is clear and moist.  Bilateral dry cerumen impaction   Eyes: Conjunctivae and EOM are normal. Pupils are equal, round, and reactive to light. Right eye exhibits no discharge. Left eye exhibits no discharge. No scleral icterus.  Neck: Normal range of motion. Neck supple. No JVD present. Carotid bruit is not present. No thyromegaly present.  Cardiovascular: Normal rate, regular rhythm, normal heart sounds and  intact distal pulses.  Exam reveals no gallop.   Pulmonary/Chest: Effort normal and breath sounds normal. No respiratory distress. He has no wheezes. He exhibits no tenderness.  Abdominal: Soft. Bowel sounds are normal. He exhibits no distension, no abdominal bruit and no mass. There is no tenderness.  Musculoskeletal: He exhibits no edema or tenderness.  Lymphadenopathy:    He has no cervical adenopathy.  Neurological: He is alert. He has normal reflexes. No cranial nerve deficit. He exhibits normal muscle tone. Coordination normal.  Skin: Skin is warm and dry. No rash noted. No erythema. No pallor.  Very dry skin  Calluses on knees Eczema on upper legs and hands   Old scarring from zoster on L buttock  Ruddy complexion Solar aging and lentigines Think skin with some ecchymoses   Psychiatric: He has a normal mood and affect.          Assessment & Plan:   Problem List Items Addressed This Visit      Cardiovascular and Mediastinum   Essential hypertension - Primary    bp in fair control at this time  BP Readings from Last 1 Encounters:  11/25/16 126/68   No changes needed Disc lifstyle change with low sodium diet and exercise  Labs today        Relevant Medications   atorvastatin (LIPITOR) 10 MG tablet     Respiratory   Chronic respiratory failure (HCC)    With copd  Sees pulmonary  Most of sob is on exertion  Continues to smoke      COPD (chronic obstructive pulmonary disease) (HCC)    Continues to see pulmonary  Sob on exertion baseline-limits activity Continues to smoke         Endocrine   Type 2 diabetes mellitus with hyperglycemia, with long-term current use of insulin (  [place])    Doing better-seeing endocrinology  Po intake is down/ appetite is lower than in the past  Unable to exercise He will make opthy exam  Disc eye and foot care       Relevant Medications   atorvastatin (LIPITOR) 10 MG tablet     Nervous and Auditory   Cerumen impaction     bilat  Recommend debrox 2 times weekly for 1-2 mo and then he can attempt to irrigate or come here for that  This will impact hearing      Localization-related symptomatic epilepsy and epileptic syndromes with complex partial seizures, not intractable, without status epilepticus ([place])    Sees neuro  No new seizures Now off lamictal        Other   History of shingles    Still has some scarring  Not bothersome  Declines zoster imm      HLD (hyperlipidemia)    Disc goals for lipids and reasons to control them Rev labs with pt (3/17) Rev low sat fat diet in detail  On statin  Diet is better      Relevant Medications   atorvastatin (LIPITOR) 10 MG tablet   Leukocytosis    Cbc today      Routine general medical examination at a health care facility    Reviewed health habits including diet and exercise and skin cancer prevention Reviewed appropriate screening tests for age  Also reviewed health mt list, fam hx and immunization status , as well as social and family history   See HPI AMW reviewed Labs drawn for wellness Declines hearing aides Rev memory testing  Declines immunizations of any kind  cologuard ordered for colon screening  Declines dexa (high risk of OP with smoking)  Aged out of prostate screening        Relevant Orders   CBC with Differential/Platelet   Comprehensive metabolic panel   Lipid panel   TSH   Tobacco abuse    Disc in detail risks of smoking and possible outcomes including copd, vascular/ heart disease, cancer , respiratory and sinus infections  Pt voices understanding Despite copd and other health problems- he is not yet motivated to quit  Enc him to reach out if he needs help in the future

## 2016-11-25 NOTE — Assessment & Plan Note (Signed)
Sees neuro  No new seizures Now off lamictal

## 2016-11-25 NOTE — Assessment & Plan Note (Signed)
Cbc today

## 2016-11-26 ENCOUNTER — Encounter: Payer: Self-pay | Admitting: *Deleted

## 2016-12-22 ENCOUNTER — Ambulatory Visit: Payer: Medicare Other | Admitting: Family Medicine

## 2017-01-06 ENCOUNTER — Other Ambulatory Visit: Payer: Self-pay | Admitting: Internal Medicine

## 2017-01-27 ENCOUNTER — Ambulatory Visit (INDEPENDENT_AMBULATORY_CARE_PROVIDER_SITE_OTHER): Payer: Medicare Other | Admitting: Internal Medicine

## 2017-01-27 ENCOUNTER — Encounter: Payer: Self-pay | Admitting: Internal Medicine

## 2017-01-27 VITALS — BP 122/74 | HR 99 | Wt 179.0 lb

## 2017-01-27 DIAGNOSIS — E1165 Type 2 diabetes mellitus with hyperglycemia: Secondary | ICD-10-CM

## 2017-01-27 DIAGNOSIS — Z794 Long term (current) use of insulin: Secondary | ICD-10-CM | POA: Diagnosis not present

## 2017-01-27 NOTE — Progress Notes (Addendum)
Patient ID: Jonathan [name], male   DOB: 03-09-1941, 76 y.o.   MRN: [id number]  Jonathan [name] is a 76 y.o.-year-old male, returning for follow up management of DM2, dx 1990, uncontrolled, insulin-dep since ~7510, without complications (h/o stroke, also mild CKD). Last visit 3 mo ago.  He is unstable on his feet b/c back pain.  Last hemoglobin A1c was: 10/30/2016: HbA1c calculated from the fructosamine is great, at 6.5%! 07/31/2016: HbA1c calculated from the fructosamine is great, at 6.15%! Lab Results  Component Value Date   HGBA1C 7.9 07/31/2016   HGBA1C 7.9 04/29/2016   HGBA1C 7.4 12/04/2015   He is on: - Glipizide XL 10 mg in am - Metformin 1000 mg 2x a day  - Lantus 25 >> 20 units at bedtime He also has at home Humalog if FS>200 (uses this seldom)  Checks sugars 3x a day: - am: 60, 67, 78-125 >> 84-111, 128 >> 85-123 >> 79-90s >> 89-126, 147, 157 - 2h after b'fast: n/c - lunch: 98, 121-149, 157 >> 85-132, 155 >> 130-160 >> 122-203, 277 - 2h after lunch: n/c - dinner: n/c >> 110-141 >> 81-128 >> 88-133, 158 (late lunch) >> n/c - 2h after dinner: 161-236 >> 155-239 (cake) >> n/c >> 160-170s >> 138-184, 133 - bedtime: n/c Highest: 178 >> 288, Lowest 75 >> 89. Sugars improved after stopping Lamictal.  He has stage 2-3 CKD, last BUN/creatinine: Lab Results  Component Value Date   BUN 15 11/25/2016   CREATININE 1.03 11/25/2016   Lab Results  Component Value Date   GFR 74.54 11/25/2016   MICRALBCREAT 7.6 09/15/2016   Allergic to ACE-I and ARB. Last ACR: Component Date Value Ref Range  . Microalb, Ur 09/15/2016 10.5* 0.0 - 1.9 mg/dL  . Creatinine,U 09/15/2016 139.0  mg/dL  . Microalb Creat Ratio 09/15/2016 7.6  0.0 - 30.0 mg/g   - Last eye exam was in 2015 . No DR - no numbness and tingling in feet. No known neuropathy - last TSH was normal: Lab Results  Component Value Date   TSH 2.98 11/25/2016   - Has HL: Lab Results  Component Value Date   CHOL 181  11/25/2016   HDL 41.70 11/25/2016   LDLCALC 58 09/01/2014   LDLDIRECT 106.0 11/25/2016   TRIG 276.0 (H) 11/25/2016   CHOLHDL 4 11/25/2016  On Lipitor. - on ASA 81  Still smokes 1 PPD!  * Around early 2015, he was hospitalized after syncope, was in ICU, was on assisted ventilation, expressive aphasia recovered- believed to have a stroke, now better.    ROS: Constitutional: no weight gain/no weight loss, no fatigue, no subjective hyperthermia, no subjective hypothermia Eyes: no blurry vision, no xerophthalmia ENT: no sore throat, no nodules palpated in throat, no dysphagia, no odynophagia, no hoarseness Cardiovascular: no CP/+ SOB/no palpitations/no leg swelling Respiratory: no cough/+ SOB/+ wheezing Gastrointestinal: no N/no V/no D/no C/no acid reflux Musculoskeletal: + muscle aches/+ joint aches Skin: no rashes, no hair loss Neurological: no tremors/no numbness/no tingling/no dizziness  I reviewed pt's medications, allergies, PMH, social hx, family hx, and changes were documented in the history of present illness. Otherwise, unchanged from my initial visit note.  Past Medical History:  Diagnosis Date  . Allergic rhinitis   . Asthma   . COPD (chronic obstructive pulmonary disease) ([place])   . Dermatitis seborrheica   . Diabetes mellitus type II   . ED (erectile dysfunction)   . Frozen shoulder   . HLD (hyperlipidemia)   .  HTN (hypertension)   . Hyperkalemia   . Kidney stone   . Other diseases of lung, not elsewhere classified   . Pulmonary nodule, right    lower lobe  . Rotator cuff injury    right  . Shoulder pain   . Stroke [facility])    pt states "last year"  . Tobacco abuse    Past Surgical History:  Procedure Laterality Date  . CERVICAL DISCECTOMY  1998  . EEG    . ETT  11/1994  . TEE WITHOUT CARDIOVERSION  12/03/2011   Procedure: TRANSESOPHAGEAL ECHOCARDIOGRAM (TEE);  Surgeon: Lelon Perla, MD;  Location: [facility] ENDOSCOPY;  Service: Cardiovascular;  Laterality: N/A;   . TM repair     Social History   Social History  . Marital status: Married    Spouse name: N/A  . Number of children: N/A  . Years of education: N/A   Occupational History  . Retired     Air cabin crew   Social History Main Topics  . Smoking status: Current Every Day Smoker    Packs/day: 1.00    Types: Cigarettes  . Smokeless tobacco: Never Used  . Alcohol use No     Comment: Quit 40 years ago  . Drug use: No  . Sexual activity: No   Other Topics Concern  . Not on file   Social History Narrative  . No narrative on file   Current Outpatient Prescriptions on File Prior to Visit  Medication Sig Dispense Refill  . ACCU-CHEK AVIVA PLUS test strip USE TO CHECK BLOOD GLUCOSE LEVELS 3 TO 4TIMES DAILY 200 each 2  . albuterol (PROVENTIL) (2.5 MG/3ML) 0.083% nebulizer solution Take 3 mLs (2.5 mg total) by nebulization 2 (two) times daily as needed for shortness of breath. 75 mL 0  . albuterol (VENTOLIN HFA) 108 (90 BASE) MCG/ACT inhaler Inhale 2 puffs into the lungs every 6 (six) hours as needed for wheezing. 1 Inhaler 11  . amLODipine (NORVASC) 5 MG tablet TAKE 1 TABLET BY MOUTH  DAILY 90 tablet 2  . aspirin 81 MG tablet Take 1 tablet (81 mg total) by mouth daily. 30 tablet 0  . atorvastatin (LIPITOR) 10 MG tablet Take 1 tablet (10 mg total) by mouth daily. 90 tablet 3  . Blood Glucose Monitoring Suppl (ACCU-CHEK AVIVA PLUS) W/DEVICE KIT USE AS DIRECTED 1 kit 0  . glipiZIDE (GLUCOTROL XL) 10 MG 24 hr tablet Take 1 tablet in the morning and half a tablet before dinner 135 tablet 1  . GLOBAL EASE INJECT PEN NEEDLES 32G X 4 MM MISC AS DIRECTED 100 each 2  . insulin glargine (LANTUS) 100 unit/mL SOPN Inject 0.2 mLs (20 Units total) into the skin daily at 8 pm. 15 mL 2  . metFORMIN (GLUCOPHAGE) 1000 MG tablet TAKE 1 TABLET BY MOUTH IN  THE MORNING, ONE-HALF  TABLET MIDDAY, AND 1 TABLET IN THE EVENING 225 tablet 1   No current facility-administered medications on file prior to visit.     Allergies  Allergen Reactions  . Amoxicillin-Pot Clavulanate Other (See Comments)    Severe stomach pain.   . Quinapril Hcl     REACTION: back pain  . Valsartan     REACTION: back pain  . Varenicline Tartrate     REACTION: AMS, hallucinations, night mares   Family History  Problem Relation Age of Onset  . Asthma Sister   . Emphysema Sister   . Diabetes Mother   . Diabetes Brother   . Heart  disease Brother    PE: BP 122/74 (BP Location: Left Arm, Patient Position: Sitting)   Pulse 99   Wt 179 lb (81.2 kg)   SpO2 94%   BMI 28.89 kg/m  Body mass index is 28.89 kg/m. Wt Readings from Last 3 Encounters:  01/27/17 179 lb (81.2 kg)  11/25/16 179 lb 8 oz (81.4 kg)  11/11/16 181 lb 2 oz (82.2 kg)   Constitutional: slightly overweight, in NAD, strong cigarette smell, walks with cane but had to use our wheelchair b/c disequilibrium  Eyes: PERRLA, EOMI, no exophthalmos ENT: moist mucous membranes, no thyromegaly, no cervical lymphadenopathy Cardiovascular: RRR, No MRG Respiratory: CTA B Gastrointestinal: abdomen soft, NT, ND, BS+ Musculoskeletal: no deformities, strength intact in all 4 Skin: moist, warm, no rashes Neurological: no tremor with outstretched hands, DTR normal in all 4  ASSESSMENT: 1. DM2, insulin-dependent, uncontrolled, with complications - h/o stroke  PLAN:  1. Patient with long-standing, uncontrolled diabetes, with slightly higher sugars after we reduced Lantus dose and starting to eat lunch. Sugars in am are great, but he has higher sugars later in the day. Will try to move Lantus in am and if not better >> split Lantus in 2. - I suggested to:  Patient Instructions  Please continue: - Glipizide XL 10 mg in am - Metformin 1000 mg 2x a day   Please move: - Lantus 20 units in am    If sugars are not better, you may try to split Lantus in 10 units 2x a day.  Please return in 3 months with your sugar log.   - continue checking sugars at different  times of the day - check 3x a day, rotating checks - advised for yearly eye exams >> he is UTD  - will check a fructosamine today - Return to clinic in 3 mo with sugar log   Office Visit on 01/27/2017  Component Date Value Ref Range Status  . Fructosamine 01/27/2017 272* 190 - 270 umol/L Final   HbA1c calculated from the fructosamine is great: 6.2%!  Philemon Kingdom, MD PhD [facility] Endocrinology

## 2017-01-27 NOTE — Patient Instructions (Addendum)
Please continue: - Glipizide XL 10 mg in am - Metformin 1000 mg 2x a day   Please move: - Lantus 20 units in am    If sugars are not better, you may try to split Lantus in 10 units 2x a day.  Please return in 3 months with your sugar log.

## 2017-01-29 ENCOUNTER — Telehealth: Payer: Self-pay

## 2017-01-29 LAB — FRUCTOSAMINE: Fructosamine: 272 umol/L — ABNORMAL HIGH (ref 190–270)

## 2017-01-29 NOTE — Telephone Encounter (Signed)
Called patient and gave lab results. Patient had no questions or concerns.  

## 2017-01-29 NOTE — Telephone Encounter (Signed)
-----   Message from Philemon Kingdom, MD sent at 01/29/2017  1:46 PM EDT ----- Jonathan [name], can you please call pt: HbA1c calculated from the fructosamine is great: 6.2%!

## 2017-02-19 ENCOUNTER — Telehealth: Payer: Self-pay | Admitting: Family Medicine

## 2017-02-19 NOTE — Telephone Encounter (Signed)
Pt dropped off form for Disability placard. Requesting permanent if possible. Placed in [place] tower.

## 2017-02-20 NOTE — Telephone Encounter (Signed)
Pt notified form ready for pick up 

## 2017-02-20 NOTE — Telephone Encounter (Signed)
Done and in IN box 

## 2017-02-20 NOTE — Telephone Encounter (Signed)
Form in your inbox 

## 2017-04-29 ENCOUNTER — Other Ambulatory Visit: Payer: Self-pay | Admitting: Internal Medicine

## 2017-05-14 ENCOUNTER — Ambulatory Visit (INDEPENDENT_AMBULATORY_CARE_PROVIDER_SITE_OTHER): Payer: Medicare Other | Admitting: Internal Medicine

## 2017-05-14 ENCOUNTER — Encounter: Payer: Self-pay | Admitting: Internal Medicine

## 2017-05-14 VITALS — BP 128/72 | HR 100 | Wt 175.0 lb

## 2017-05-14 DIAGNOSIS — Z720 Tobacco use: Secondary | ICD-10-CM | POA: Diagnosis not present

## 2017-05-14 DIAGNOSIS — E1159 Type 2 diabetes mellitus with other circulatory complications: Secondary | ICD-10-CM

## 2017-05-14 DIAGNOSIS — Z794 Long term (current) use of insulin: Secondary | ICD-10-CM

## 2017-05-14 NOTE — Patient Instructions (Addendum)
Please continue: - Glipizide XL 10 mg in am - Metformin 1000 mg 2x a day   Please move Lantus 20 units in am. If sugars still high before lunch, then split Lantus in 10 units 2x a day.  Please return in 3 months with your sugar log.

## 2017-05-14 NOTE — Progress Notes (Signed)
Patient ID: Jonathan [name], male   DOB: 1941-08-20, 76 y.o.   MRN: [id number]  Jonathan [name] is a 76 y.o.-year-old male, returning for follow up management of DM2, dx 1990, uncontrolled, insulin-dep since ~2841, without complications (h/o stroke, also mild CKD). Last visit 3 mo ago. He is here with his wife, who offers part of the history, especially about medication dosing and patient's medical history.  His wife has stage III lung cancer and will have to go through chemotherapy.   Last hemoglobin A1c was:  01/27/2017: HbA1c calculated from the fructosamine is great: 6.2%! 10/30/2016: HbA1c calculated from the fructosamine is great, at 6.5%! 07/31/2016: HbA1c calculated from the fructosamine is great, at 6.15%! Lab Results  Component Value Date   HGBA1C 7.9 07/31/2016   HGBA1C 7.9 04/29/2016   HGBA1C 7.4 12/04/2015   He is on: - Glipizide XL 10 mg in am - Metformin 1000 mg 2x a day  - Lantus 25 >> 20 units at bedtime >> Did not move it in a.m., as advised at last visit, but takes it at lunchtime.  He also has at home Humalog if FS>200 (uses this seldom)  Checks sugars 3x a day: - am:  85-123 >> 79-90s >> 89-126, 147, 157 >> 87-167 - 2h after b'fast: n/c - lunch: 85-132, 155 >> 130-160 >> 122-203, 277 >> 92-252, 340 - 2h after lunch: n/c - dinner:  81-128 >> 88-133, 158 (late lunch) >> n/c  - 2h after dinner:  n/c >> 160-170s >> 138-184, 133 >> 139-280, 373 - bedtime: n/c Highest: 178 >> 288 >> 373. Lowest 75 >> 89 >> 87. Sugars improved after stopping Lamictal.  He has Stage II-3 CKD, last BUN/creatinine: Lab Results  Component Value Date   BUN 15 11/25/2016   CREATININE 1.03 11/25/2016   Lab Results  Component Value Date   GFR 74.54 11/25/2016   MICRALBCREAT 7.6 09/15/2016   He is allergic to ACE inhibitor's an ARB's. Last ACR: Lab Results  Component Value Date   MICRALBCREAT 7.6 09/15/2016   MICRALBCREAT 12.7 04/28/2014   MICRALBCREAT 25.3 12/17/2009   MICRALBCREAT 22.6 02/13/2009   - Last eye exam was in 2015 >> No DR. - he denies numbness and tingling in feet.  - Has HL: Lab Results  Component Value Date   CHOL 181 11/25/2016   HDL 41.70 11/25/2016   LDLCALC 58 09/01/2014   LDLDIRECT 106.0 11/25/2016   TRIG 276.0 (H) 11/25/2016   CHOLHDL 4 11/25/2016  On Lipitor. - on ASA 81  Still smokes 1 PPD.  * Around early 2015, he was hospitalized after syncope, was in ICU, was on assisted ventilation, expressive aphasia recovered- believed to have a stroke, now better.    He is unstable on his feet because of back pain.  ROS: Constitutional: no weight gain/no weight loss, no fatigue, no subjective hyperthermia, no subjective hypothermia Eyes: no blurry vision, no xerophthalmia ENT: no sore throat, no nodules palpated in throat, no dysphagia, no odynophagia, no hoarseness Cardiovascular: no CP/no SOB/no palpitations/no leg swelling Respiratory: no cough/no SOB/no wheezing Gastrointestinal: no N/no V/no D/no C/no acid reflux Musculoskeletal: + muscle aches/+ joint aches Skin: no rashes, no hair loss Neurological: no tremors/no numbness/no tingling/no dizziness  I reviewed pt's medications, allergies, PMH, social hx, family hx, and changes were documented in the history of present illness. Otherwise, unchanged from my initial visit note.  Past Medical History:  Diagnosis Date  . Allergic rhinitis   . Asthma   . COPD (  chronic obstructive pulmonary disease) ([place])   . Dermatitis seborrheica   . Diabetes mellitus type II   . ED (erectile dysfunction)   . Frozen shoulder   . HLD (hyperlipidemia)   . HTN (hypertension)   . Hyperkalemia   . Kidney stone   . Other diseases of lung, not elsewhere classified   . Pulmonary nodule, right    lower lobe  . Rotator cuff injury    right  . Shoulder pain   . Stroke [facility])    pt states "last year"  . Tobacco abuse    Past Surgical History:  Procedure Laterality Date  . CERVICAL  DISCECTOMY  1998  . EEG    . ETT  11/1994  . TEE WITHOUT CARDIOVERSION  12/03/2011   Procedure: TRANSESOPHAGEAL ECHOCARDIOGRAM (TEE);  Surgeon: Lelon Perla, MD;  Location: [facility] ENDOSCOPY;  Service: Cardiovascular;  Laterality: N/A;  . TM repair     Social History   Social History  . Marital status: Married    Spouse name: N/A  . Number of children: N/A  . Years of education: N/A   Occupational History  . Retired     Air cabin crew   Social History Main Topics  . Smoking status: Current Every Day Smoker    Packs/day: 1.00    Types: Cigarettes  . Smokeless tobacco: Never Used  . Alcohol use No     Comment: Quit 40 years ago  . Drug use: No  . Sexual activity: No   Other Topics Concern  . Not on file   Social History Narrative  . No narrative on file   Current Outpatient Prescriptions on File Prior to Visit  Medication Sig Dispense Refill  . ACCU-CHEK AVIVA PLUS test strip USE TO CHECK BLOOD GLUCOSE LEVELS 3 TO 4TIMES DAILY 200 each 2  . albuterol (PROVENTIL) (2.5 MG/3ML) 0.083% nebulizer solution Take 3 mLs (2.5 mg total) by nebulization 2 (two) times daily as needed for shortness of breath. 75 mL 0  . albuterol (VENTOLIN HFA) 108 (90 BASE) MCG/ACT inhaler Inhale 2 puffs into the lungs every 6 (six) hours as needed for wheezing. 1 Inhaler 11  . amLODipine (NORVASC) 5 MG tablet TAKE 1 TABLET BY MOUTH  DAILY 90 tablet 2  . aspirin 81 MG tablet Take 1 tablet (81 mg total) by mouth daily. 30 tablet 0  . atorvastatin (LIPITOR) 10 MG tablet Take 1 tablet (10 mg total) by mouth daily. 90 tablet 3  . Blood Glucose Monitoring Suppl (ACCU-CHEK AVIVA PLUS) W/DEVICE KIT USE AS DIRECTED 1 kit 0  . glipiZIDE (GLUCOTROL XL) 10 MG 24 hr tablet TAKE 1 TABLET IN THE  MORNING AND HALF A TABLET  BEFORE DINNER 135 tablet 2  . GLOBAL EASE INJECT PEN NEEDLES 32G X 4 MM MISC AS DIRECTED 100 each 2  . insulin glargine (LANTUS) 100 unit/mL SOPN Inject 0.2 mLs (20 Units total) into the skin daily  at 8 pm. 15 mL 2  . metFORMIN (GLUCOPHAGE) 1000 MG tablet TAKE 1 TABLET BY MOUTH IN  THE MORNING, ONE-HALF  TABLET MIDDAY, AND 1 TABLET IN THE EVENING 225 tablet 1   No current facility-administered medications on file prior to visit.    Allergies  Allergen Reactions  . Amoxicillin-Pot Clavulanate Other (See Comments)    Severe stomach pain.   . Quinapril Hcl     REACTION: back pain  . Valsartan     REACTION: back pain  . Varenicline Tartrate  REACTION: AMS, hallucinations, night mares   Family History  Problem Relation Age of Onset  . Asthma Sister   . Emphysema Sister   . Diabetes Mother   . Diabetes Brother   . Heart disease Brother    PE: BP 128/72 (BP Location: Left Arm, Patient Position: Sitting)   Pulse 100   Wt 175 lb (79.4 kg)   SpO2 93%   BMI 28.25 kg/m  Body mass index is 28.25 kg/m. Wt Readings from Last 3 Encounters:  05/14/17 175 lb (79.4 kg)  01/27/17 179 lb (81.2 kg)  11/25/16 179 lb 8 oz (81.4 kg)   Constitutional: overweight, in NAD, in wheelchair  Eyes: PERRLA, EOMI, no exophthalmos ENT: moist mucous membranes, no thyromegaly, no cervical lymphadenopathy Cardiovascular: RRR, No MRG Respiratory: CTA B Gastrointestinal: abdomen soft, NT, ND, BS+ Musculoskeletal: no deformities, strength intact in all 4 Skin: moist, warm, no rashes Neurological: no tremor with outstretched hands, DTR normal in all 4  ASSESSMENT: 1. DM2, insulin-dependent, uncontrolled, with complications - Cerebrovascular disease-h/o stroke  2. Tobacco abuse  PLAN:  1. Patient with long-standing, Uncontrolled diabetes, with higher sugars later in the day, compare with last visit. To avoid low blood sugars in a.m. at last visit I advised him to keep the same dose of Lantus but move it to a.m. to hopefully improve sugars throughout the day. He is now taking it at lunchtime and did not see a change. We discussed about splitting the Lantus into but he is concerned about the cost of  needles, so we decided to first try to move the whole dose in the morning and if this doesn't improve the sugars throughout the day, to split it in half. - I suggested to:  Patient Instructions  Please continue: - Glipizide XL 10 mg in am - Metformin 1000 mg 2x a day   Please move Lantus 20 units in am. If sugars still high before lunch, then split Lantus in 10 units 2x a day.  Please return in 3 months with your sugar log.   - today we'll check a fructosamine level - I advised him to continue checking sugars at different times of the day - check 1-2x a day, rotating checks - advised for yearly eye exams >> he needs one - Return to clinic in 3 mo with sugar log    2. Tobacco abuse - Patient is to smoking 1 pack a day, despite new diagnosis for his wife of stage III lung cancer. He tells me he is trying to quit. his wife is also still smoking so I discussed with both about the importance of stopping smoking ASAP.   Office Visit on 05/14/2017  Component Date Value Ref Range Status  . Fructosamine 05/14/2017 341* 190 - 270 umol/L Final   HbA1c calculated from the fructosamine is higher, at 7.4%.  Philemon Kingdom, MD PhD [facility] End[facility]

## 2017-05-18 LAB — FRUCTOSAMINE: Fructosamine: 341 umol/L — ABNORMAL HIGH (ref 190–270)

## 2017-05-19 ENCOUNTER — Telehealth: Payer: Self-pay

## 2017-05-19 NOTE — Telephone Encounter (Signed)
-----   Message from Philemon Kingdom, MD sent at 05/18/2017  5:43 PM EDT ----- [name], can you please call pt: HbA1c calculated from the fructosamine is higher, at 7.4%.

## 2017-05-19 NOTE — Telephone Encounter (Signed)
Called patient and gave lab results. Patient had no questions or concerns.  

## 2017-07-14 ENCOUNTER — Other Ambulatory Visit: Payer: Self-pay | Admitting: *Deleted

## 2017-07-14 MED ORDER — ATORVASTATIN CALCIUM 10 MG PO TABS: 10.0000 mg | ORAL_TABLET | Freq: Every day | ORAL | 1 refills | Status: DC

## 2017-07-14 NOTE — Telephone Encounter (Signed)
Received faxed refill request from pharmacy for Lipitor. Refill sent electronically to [facility].

## 2017-08-03 ENCOUNTER — Other Ambulatory Visit: Payer: Self-pay | Admitting: Internal Medicine

## 2017-08-04 ENCOUNTER — Emergency Department (HOSPITAL_COMMUNITY): Payer: Medicare Other

## 2017-08-04 ENCOUNTER — Inpatient Hospital Stay (HOSPITAL_COMMUNITY)
Admission: EM | Admit: 2017-08-04 | Discharge: 2017-08-06 | DRG: 190 | Disposition: A | Discharge status: Home / Self Care | Payer: Medicare Other | Attending: Family Medicine | Admitting: Family Medicine

## 2017-08-04 ENCOUNTER — Encounter (HOSPITAL_COMMUNITY): Payer: Self-pay | Admitting: Emergency Medicine

## 2017-08-04 ENCOUNTER — Other Ambulatory Visit: Payer: Self-pay

## 2017-08-04 DIAGNOSIS — J9611 Chronic respiratory failure with hypoxia: Secondary | ICD-10-CM | POA: Diagnosis present

## 2017-08-04 DIAGNOSIS — E1165 Type 2 diabetes mellitus with hyperglycemia: Secondary | ICD-10-CM

## 2017-08-04 DIAGNOSIS — I493 Ventricular premature depolarization: Secondary | ICD-10-CM | POA: Diagnosis present

## 2017-08-04 DIAGNOSIS — J129 Viral pneumonia, unspecified: Secondary | ICD-10-CM | POA: Diagnosis present

## 2017-08-04 DIAGNOSIS — Z880 Allergy status to penicillin: Secondary | ICD-10-CM | POA: Diagnosis not present

## 2017-08-04 DIAGNOSIS — Z720 Tobacco use: Secondary | ICD-10-CM | POA: Diagnosis present

## 2017-08-04 DIAGNOSIS — J440 Chronic obstructive pulmonary disease with acute lower respiratory infection: Secondary | ICD-10-CM | POA: Diagnosis present

## 2017-08-04 DIAGNOSIS — J441 Chronic obstructive pulmonary disease with (acute) exacerbation: Principal | ICD-10-CM | POA: Diagnosis present

## 2017-08-04 DIAGNOSIS — R0602 Shortness of breath: Secondary | ICD-10-CM | POA: Diagnosis present

## 2017-08-04 DIAGNOSIS — F17210 Nicotine dependence, cigarettes, uncomplicated: Secondary | ICD-10-CM | POA: Diagnosis present

## 2017-08-04 DIAGNOSIS — R000 Tachycardia, unspecified: Secondary | ICD-10-CM | POA: Diagnosis present

## 2017-08-04 DIAGNOSIS — J181 Lobar pneumonia, unspecified organism: Secondary | ICD-10-CM | POA: Diagnosis not present

## 2017-08-04 DIAGNOSIS — Z79899 Other long term (current) drug therapy: Secondary | ICD-10-CM | POA: Diagnosis not present

## 2017-08-04 DIAGNOSIS — Z888 Allergy status to other drugs, medicaments and biological substances status: Secondary | ICD-10-CM

## 2017-08-04 DIAGNOSIS — J9610 Chronic respiratory failure, unspecified whether with hypoxia or hypercapnia: Secondary | ICD-10-CM | POA: Diagnosis present

## 2017-08-04 DIAGNOSIS — Z833 Family history of diabetes mellitus: Secondary | ICD-10-CM

## 2017-08-04 DIAGNOSIS — I10 Essential (primary) hypertension: Secondary | ICD-10-CM | POA: Diagnosis present

## 2017-08-04 DIAGNOSIS — Z794 Long term (current) use of insulin: Secondary | ICD-10-CM

## 2017-08-04 DIAGNOSIS — Z825 Family history of asthma and other chronic lower respiratory diseases: Secondary | ICD-10-CM | POA: Diagnosis not present

## 2017-08-04 DIAGNOSIS — Z8249 Family history of ischemic heart disease and other diseases of the circulatory system: Secondary | ICD-10-CM | POA: Diagnosis not present

## 2017-08-04 DIAGNOSIS — Z7982 Long term (current) use of aspirin: Secondary | ICD-10-CM

## 2017-08-04 DIAGNOSIS — E785 Hyperlipidemia, unspecified: Secondary | ICD-10-CM | POA: Diagnosis present

## 2017-08-04 DIAGNOSIS — J189 Pneumonia, unspecified organism: Secondary | ICD-10-CM | POA: Diagnosis present

## 2017-08-04 DIAGNOSIS — T380X5A Adverse effect of glucocorticoids and synthetic analogues, initial encounter: Secondary | ICD-10-CM | POA: Diagnosis present

## 2017-08-04 DIAGNOSIS — Z8673 Personal history of transient ischemic attack (TIA), and cerebral infarction without residual deficits: Secondary | ICD-10-CM

## 2017-08-04 DIAGNOSIS — Z9981 Dependence on supplemental oxygen: Secondary | ICD-10-CM | POA: Diagnosis not present

## 2017-08-04 LAB — CBC
HCT: 50.9 % (ref 39.0–52.0)
Hemoglobin: 17 g/dL (ref 13.0–17.0)
MCH: 29.7 pg (ref 26.0–34.0)
MCHC: 33.4 g/dL (ref 30.0–36.0)
MCV: 88.8 fL (ref 78.0–100.0)
Platelets: 294 10*3/uL (ref 150–400)
RBC: 5.73 MIL/uL (ref 4.22–5.81)
RDW: 13.9 % (ref 11.5–15.5)
WBC: 12.9 10*3/uL — ABNORMAL HIGH (ref 4.0–10.5)

## 2017-08-04 LAB — BASIC METABOLIC PANEL
Anion gap: 11 (ref 5–15)
BUN: 14 mg/dL (ref 6–20)
CO2: 30 mmol/L (ref 22–32)
Calcium: 9 mg/dL (ref 8.9–10.3)
Chloride: 96 mmol/L — ABNORMAL LOW (ref 101–111)
Creatinine, Ser: 1.06 mg/dL (ref 0.61–1.24)
GFR calc Af Amer: >60 mL/min (ref >60)
GFR calc non Af Amer: >60 mL/min (ref >60)
Glucose, Bld: 300 mg/dL — ABNORMAL HIGH (ref 65–99)
Potassium: 4.7 mmol/L (ref 3.5–5.1)
Sodium: 137 mmol/L (ref 135–145)

## 2017-08-04 LAB — I-STAT TROPONIN, ED: Troponin i, poc: 0 ng/mL (ref 0.00–0.08)

## 2017-08-04 LAB — CBG MONITORING, ED: Glucose-Capillary: 273 mg/dL — ABNORMAL HIGH (ref 65–99)

## 2017-08-04 MED ORDER — ALBUTEROL SULFATE (2.5 MG/3ML) 0.083% IN NEBU
5.0000 mg | INHALATION_SOLUTION | Freq: Once | RESPIRATORY_TRACT | Status: AC
  Administered 2017-08-04: 5 mg via RESPIRATORY_TRACT
  Filled 2017-08-04: qty 6

## 2017-08-04 MED ORDER — ALBUTEROL (5 MG/ML) CONTINUOUS INHALATION SOLN
7.5000 mg/h | INHALATION_SOLUTION | Freq: Once | RESPIRATORY_TRACT | Status: AC
  Administered 2017-08-04: 7.5 mg/h via RESPIRATORY_TRACT
  Filled 2017-08-04: qty 20

## 2017-08-04 MED ORDER — ATORVASTATIN CALCIUM 10 MG PO TABS
10.0000 mg | ORAL_TABLET | Freq: Every day | ORAL | Status: DC
  Administered 2017-08-05: 10 mg via ORAL
  Filled 2017-08-04: qty 1

## 2017-08-04 MED ORDER — METHYLPREDNISOLONE SODIUM SUCC 40 MG IJ SOLR: 40.0000 mg | Freq: Three times a day (TID) | INTRAMUSCULAR | Status: DC

## 2017-08-04 MED ORDER — INSULIN ASPART 100 UNIT/ML ~~LOC~~ SOLN
0.0000 [IU] | Freq: Three times a day (TID) | SUBCUTANEOUS | Status: DC
  Administered 2017-08-05: 5 [IU] via SUBCUTANEOUS
  Administered 2017-08-05: 2 [IU] via SUBCUTANEOUS
  Administered 2017-08-05: 8 [IU] via SUBCUTANEOUS
  Administered 2017-08-06: 5 [IU] via SUBCUTANEOUS
  Administered 2017-08-06: 8 [IU] via SUBCUTANEOUS

## 2017-08-04 MED ORDER — IPRATROPIUM-ALBUTEROL 0.5-2.5 (3) MG/3ML IN SOLN
3.0000 mL | Freq: Once | RESPIRATORY_TRACT | Status: AC
  Administered 2017-08-04: 3 mL via RESPIRATORY_TRACT
  Filled 2017-08-04: qty 3

## 2017-08-04 MED ORDER — LEVOFLOXACIN IN D5W 750 MG/150ML IV SOLN
750.0000 mg | Freq: Once | INTRAVENOUS | Status: AC
  Administered 2017-08-04: 750 mg via INTRAVENOUS
  Filled 2017-08-04: qty 150

## 2017-08-04 MED ORDER — METHYLPREDNISOLONE SODIUM SUCC 125 MG IJ SOLR
125.0000 mg | Freq: Once | INTRAMUSCULAR | Status: AC
  Administered 2017-08-04: 125 mg via INTRAVENOUS
  Filled 2017-08-04: qty 2

## 2017-08-04 MED ORDER — ASPIRIN EC 81 MG PO TBEC
81.0000 mg | DELAYED_RELEASE_TABLET | Freq: Every day | ORAL | Status: DC
  Administered 2017-08-05 – 2017-08-06 (×2): 81 mg via ORAL
  Filled 2017-08-04 (×2): qty 1

## 2017-08-04 MED ORDER — ENOXAPARIN SODIUM 40 MG/0.4ML ~~LOC~~ SOLN
40.0000 mg | Freq: Every day | SUBCUTANEOUS | Status: DC
  Administered 2017-08-05 (×2): 40 mg via SUBCUTANEOUS
  Filled 2017-08-04 (×2): qty 0.4

## 2017-08-04 MED ORDER — INSULIN ASPART 100 UNIT/ML ~~LOC~~ SOLN
0.0000 [IU] | Freq: Every day | SUBCUTANEOUS | Status: DC
  Administered 2017-08-04: 23:00:00 via SUBCUTANEOUS
  Administered 2017-08-05: 3 [IU] via SUBCUTANEOUS
  Filled 2017-08-04: qty 1

## 2017-08-04 MED ORDER — LEVOFLOXACIN IN D5W 750 MG/150ML IV SOLN: 750.0000 mg | INTRAVENOUS | Status: DC

## 2017-08-04 MED ORDER — AMLODIPINE BESYLATE 5 MG PO TABS
5.0000 mg | ORAL_TABLET | Freq: Every day | ORAL | Status: DC
  Administered 2017-08-05 – 2017-08-06 (×2): 5 mg via ORAL
  Filled 2017-08-04 (×2): qty 1

## 2017-08-04 MED ORDER — INSULIN GLARGINE 100 UNIT/ML ~~LOC~~ SOLN
20.0000 [IU] | Freq: Every day | SUBCUTANEOUS | Status: DC
  Administered 2017-08-04 – 2017-08-05 (×2): 20 [IU] via SUBCUTANEOUS
  Filled 2017-08-04 (×3): qty 0.2

## 2017-08-04 MED ORDER — SODIUM CHLORIDE 0.9 % IV BOLUS (SEPSIS)
500.0000 mL | Freq: Once | INTRAVENOUS | Status: AC
  Administered 2017-08-04: 500 mL via INTRAVENOUS

## 2017-08-04 MED ORDER — IPRATROPIUM-ALBUTEROL 0.5-2.5 (3) MG/3ML IN SOLN
3.0000 mL | Freq: Four times a day (QID) | RESPIRATORY_TRACT | Status: DC
  Administered 2017-08-04: 3 mL via RESPIRATORY_TRACT
  Filled 2017-08-04: qty 3

## 2017-08-04 MED ORDER — ALBUTEROL SULFATE (2.5 MG/3ML) 0.083% IN NEBU: 2.5000 mg | INHALATION_SOLUTION | RESPIRATORY_TRACT | Status: DC | PRN

## 2017-08-04 MED ORDER — IPRATROPIUM-ALBUTEROL 0.5-2.5 (3) MG/3ML IN SOLN
3.0000 mL | Freq: Four times a day (QID) | RESPIRATORY_TRACT | Status: DC
  Administered 2017-08-05 – 2017-08-06 (×6): 3 mL via RESPIRATORY_TRACT
  Filled 2017-08-04 (×6): qty 3

## 2017-08-04 NOTE — ED Notes (Signed)
Called floor for report , receiving Nurse in the middle of "in-service". Will call back later.

## 2017-08-04 NOTE — ED Triage Notes (Signed)
Pt complaint of worsening SOB since last night; denies cough/fever; hx of COPD.

## 2017-08-04 NOTE — H&P (Signed)
History and Physical    Jonathan [name]:[id number] DOB: 05/14/1941 DOA: 08/04/2017  PCP: Abner Greenspan, MD   Patient coming from: Home  Chief Complaint: Cough, SOB   HPI: Jonathan [name] is a 76 y.o. male with medical history significant for hypertension, insulin-dependent diabetes mellitus, COPD with chronic hypoxic respiratory failure, and tobacco abuse, now presenting to the emergency department for evaluation of cough and dyspnea that developed 2-3 days ago and worsened significantly overnight.  Patient reports that he had been in his usual state of health until the insidious development of progressive dyspnea with cough productive of thick yellow sputum 2-3 days ago.  Last night, his condition began to worsen fairly acutely and he has been unable to achieve adequate relief with his home therapies.  ED Course: Upon arrival to the ED, patient is found to be afebrile, saturating adequately on 3 L/min of supplemental oxygen, tachypneic, and with vitals otherwise stable.  EKG features a sinus tachycardia with rate 109 and PVC.  Chest x-ray is notable for a right lower lobe infiltrate concerning for pneumonia.  Chemistry panel reveals a glucose of 300 and CBC is notable for leukocytosis to 12,900.  Troponin is undetectable.  Patient was treated with duo nebs, continuous albuterol nebs, 125 mg IV Solu-Medrol, 500 cc normal saline, and Levaquin.  He continued to have increased work of breathing in the ED and will be admitted to the medical/surgical unit for ongoing evaluation and management of community-acquired pneumonia with acute exacerbation in COPD.  Review of Systems:  All other systems reviewed and apart from HPI, are negative.  Past Medical History:  Diagnosis Date  . Allergic rhinitis   . Asthma   . COPD (chronic obstructive pulmonary disease) ([place])   . Dermatitis seborrheica   . Diabetes mellitus type II   . ED (erectile dysfunction)   . Frozen shoulder   . HLD (hyperlipidemia)    . HTN (hypertension)   . Hyperkalemia   . Kidney stone   . Other diseases of lung, not elsewhere classified   . Pulmonary nodule, right    lower lobe  . Rotator cuff injury    right  . Shoulder pain   . Stroke [facility])    pt states "last year"  . Tobacco abuse     Past Surgical History:  Procedure Laterality Date  . CERVICAL DISCECTOMY  1998  . EEG    . ETT  11/1994  . TM repair       reports that he has been smoking cigarettes.  He has been smoking about 1.00 pack per day. he has never used smokeless tobacco. He reports that he does not drink alcohol or use drugs.  Allergies  Allergen Reactions  . Amoxicillin-Pot Clavulanate Other (See Comments)    Severe stomach pain.   . Quinapril Hcl     REACTION: back pain  . Valsartan     REACTION: back pain  . Varenicline Tartrate     REACTION: AMS, hallucinations, night mares    Family History  Problem Relation Age of Onset  . Asthma Sister   . Emphysema Sister   . Diabetes Mother   . Diabetes Brother   . Heart disease Brother      Prior to Admission medications   Medication Sig Start Date End Date Taking? Authorizing Provider  albuterol (VENTOLIN HFA) 108 (90 BASE) MCG/ACT inhaler Inhale 2 puffs into the lungs every 6 (six) hours as needed for wheezing. 02/01/14  Yes Tower, O'Neill  A, MD  amLODipine (NORVASC) 5 MG tablet TAKE 1 TABLET BY MOUTH  DAILY 10/08/16  Yes Tower, Wynelle Fanny, MD  aspirin 81 MG tablet Take 1 tablet (81 mg total) by mouth daily. 11/26/13  Yes Ghimire, Henreitta Leber, MD  atorvastatin (LIPITOR) 10 MG tablet Take 1 tablet (10 mg total) by mouth daily. 07/14/17  Yes Tower, Marne A, MD  glipiZIDE (GLUCOTROL XL) 10 MG 24 hr tablet TAKE 1 TABLET IN THE  MORNING AND HALF A TABLET  BEFORE DINNER 04/29/17  Yes Philemon Kingdom, MD  insulin glargine (LANTUS) 100 unit/mL SOPN Inject 0.2 mLs (20 Units total) into the skin daily at 8 pm. 10/30/16  Yes Philemon Kingdom, MD  metFORMIN (GLUCOPHAGE) 1000 MG tablet TAKE 1 TABLET BY  MOUTH IN  THE MORNING, ONE-HALF  TABLET MIDDAY, AND 1 TABLET IN THE EVENING 01/06/17  Yes Philemon Kingdom, MD  ACCU-CHEK AVIVA PLUS test strip USE TO CHECK BLOOD GLUCOSE LEVELS 3 TO 4TIMES DAILY 08/03/17   Philemon Kingdom, MD  albuterol (PROVENTIL) (2.5 MG/3ML) 0.083% nebulizer solution Take 3 mLs (2.5 mg total) by nebulization 2 (two) times daily as needed for shortness of breath. Patient not taking: Reported on 08/04/2017 11/26/13   Jonetta Osgood, MD  Blood Glucose Monitoring Suppl (ACCU-CHEK AVIVA PLUS) W/DEVICE KIT USE AS DIRECTED 12/13/14   Tower, Wynelle Fanny, MD  GLOBAL EASE INJECT PEN NEEDLES 32G X 4 MM MISC AS DIRECTED 06/03/16   Philemon Kingdom, MD    Physical Exam: Vitals:   08/04/17 2030 08/04/17 2055 08/04/17 2100 08/04/17 2115  BP: 134/87 134/87 (!) 106/57   Pulse: (!) 115 (!) 110 (!) 105 (!) 105  Resp: (!) 35 20    Temp:      TempSrc:      SpO2: 92% 95% 95% 96%  Weight:      Height:          Constitutional: Tachypneic, dyspneic with speech, no pallor, no diaphoresis Eyes: PERTLA, lids and conjunctivae normal ENMT: Mucous membranes are moist. Posterior pharynx clear of any exudate or lesions.   Neck: normal, supple, no masses, no thyromegaly Respiratory: markedly diminished bilaterally with prolonged expiratory phase and scattered wheezes. Increased WOB.  Cardiovascular: S1 & S2 heard, regular rate and rhythm. No extremity edema. No significant JVD. Abdomen: No distension, no tenderness, no masses palpated. Bowel sounds normal.  Musculoskeletal: no clubbing / cyanosis. No joint deformity upper and lower extremities.   Skin: no significant rashes, lesions, ulcers. Warm, dry, well-perfused. Neurologic: CN 2-12 grossly intact. Sensation intact. Strength 5/5 in all 4 limbs.  Psychiatric: Alert and oriented x 3. Calm, cooperative.     Labs on Admission: I have personally reviewed following labs and imaging studies  CBC: Recent Labs  Lab 08/04/17 1713  WBC 12.9*    HGB 17.0  HCT 50.9  MCV 88.8  PLT 774   Basic Metabolic Panel: Recent Labs  Lab 08/04/17 1713  NA 137  K 4.7  CL 96*  CO2 30  GLUCOSE 300*  BUN 14  CREATININE 1.06  CALCIUM 9.0   GFR: Estimated Creatinine Clearance: 54.6 mL/min (by C-G formula based on SCr of 1.06 mg/dL). Liver Function Tests: No results for input(s): AST, ALT, ALKPHOS, BILITOT, PROT, ALBUMIN in the last 168 hours. No results for input(s): LIPASE, AMYLASE in the last 168 hours. No results for input(s): AMMONIA in the last 168 hours. Coagulation Profile: No results for input(s): INR, PROTIME in the last 168 hours. Cardiac Enzymes: No results for  input(s): CKTOTAL, CKMB, CKMBINDEX, TROPONINI in the last 168 hours. BNP (last 3 results) No results for input(s): PROBNP in the last 8760 hours. HbA1C: No results for input(s): HGBA1C in the last 72 hours. CBG: No results for input(s): GLUCAP in the last 168 hours. Lipid Profile: No results for input(s): CHOL, HDL, LDLCALC, TRIG, CHOLHDL, LDLDIRECT in the last 72 hours. Thyroid Function Tests: No results for input(s): TSH, T4TOTAL, FREET4, T3FREE, THYROIDAB in the last 72 hours. Anemia Panel: No results for input(s): VITAMINB12, FOLATE, FERRITIN, TIBC, IRON, RETICCTPCT in the last 72 hours. Urine analysis:    Component Value Date/Time   COLORURINE YELLOW 12/14/2013 [place] 12/14/2013 1652   LABSPEC 1.024 12/14/2013 1652   PHURINE 6.5 12/14/2013 1652   GLUCOSEU NEGATIVE 12/14/2013 1652   HGBUR NEGATIVE 12/14/2013 1652   HGBUR large 08/30/2007 1447   BILIRUBINUR neg. 02/21/2014 1503   KETONESUR NEGATIVE 12/14/2013 1652   PROTEINUR Trace 02/21/2014 1503   PROTEINUR NEGATIVE 12/14/2013 1652   UROBILINOGEN 0.2 02/21/2014 1503   UROBILINOGEN 1.0 12/14/2013 1652   NITRITE neg. 02/21/2014 1503   NITRITE NEGATIVE 12/14/2013 1652   LEUKOCYTESUR Trace 02/21/2014 1503   Sepsis Labs: @LABRCNTIP (procalcitonin:4,lacticidven:4) )No results  found for this or any previous visit (from the past 240 hour(s)).   Radiological Exams on Admission: Dg Chest 2 View  Result Date: 08/04/2017 CLINICAL DATA:  Pt complaint of worsening SOB since last night; denies cough/fever; hx of COPD.; diabetic; HTN; smoker EXAM: CHEST  2 VIEW COMPARISON:  02/03/2014 FINDINGS: Heart size is accentuated by the AP position of the patient. There is reticular change in the right lower lobe, compatible with infectious process. No focal consolidations. No pulmonary edema. Remote L1 compression fracture. IMPRESSION: Right lower lobe infiltrate. Followup PA and lateral chest X-ray is recommended in 3-4 weeks following trial of antibiotic therapy to ensure resolution and exclude underlying malignancy. Electronically Signed   By: Nolon Nations M.D.   On: 08/04/2017 18:18    EKG: Independently reviewed. Sinus tachycardia (rate 109).   Assessment/Plan  1. Pneumonia  - Pt presents with progressive dyspnea, increased cough with sputum production, and is found to have RLL pneumonia  - Treated in ED with nebs, Levaquin, and steroids  - Plan to obtain blood and sputum cultures, check urine for strep pneumo antigen, continue Levaquin and adjunctive glucocorticoid, continue supportive care with nebs and supplemental O2   2. COPD with acute exacerbation; chronic hypoxic respiratory failure - Pt has chronic 2.5 Lpm supplemental O2 requirement  - Presents with increased WOB, increased cough and sputum production, wheezes and obstructive breathing pattern  - Treated in ED with multiple nebs and 125 mg IV Solu-Medrol  - Continue supplemental O2, scheduled DuoNeb, prn albuterol nebs, systemic steroid, abx as above    3. Hypertension  - BP is at goal  - Continue Norvasc    4. Hx of stroke  - No focal deficits  - Continue ASA and statin   5. Insulin-dependent DM  - A1c was 7.9% one year ago - Managed at home with Lantus 20 units qHS, glipizide, and metformin  - Hold  oral agents while in hospital, continue Lantus 20 units qHS, and start a moderate-intensity SSI with Novolog     DVT prophylaxis: Lovenox Code Status: Full  Family Communication: Discussed with patient Disposition Plan: Admit to med-surg Consults called: None Admission status: Inpatient    Vianne Bulls, MD [facility] Pager [phone]  If 7PM-7AM, please contact night-coverage [email or web address]  Password [email or web address]  08/04/2017, 9:35 PM

## 2017-08-04 NOTE — ED Provider Notes (Signed)
[facility] DEPT Provider Note   CSN: [id number] Arrival date & time: 08/04/17  1638     History   Chief Complaint Chief Complaint  Patient presents with  . COPD    HPI KATHLEEN LIKINS is a 76 y.o. male.  HPI   Pt is a 76 yo male presenting with SOB.  Patient has COPD, currently still smokes.  This is been going on for 2-3 days.  Patient on 2.5 L at home.  On 3 L here patient is satting 95%.  However he reports increased shortness of breath.  Patient has history of hypertension hyperlipidemia.  She reports cough with increased sputum.      Past Medical History:  Diagnosis Date  . Allergic rhinitis   . Asthma   . COPD (chronic obstructive pulmonary disease) ([place])   . Dermatitis seborrheica   . Diabetes mellitus type II   . ED (erectile dysfunction)   . Frozen shoulder   . HLD (hyperlipidemia)   . HTN (hypertension)   . Hyperkalemia   . Kidney stone   . Other diseases of lung, not elsewhere classified   . Pulmonary nodule, right    lower lobe  . Rotator cuff injury    right  . Shoulder pain   . Stroke [facility])    pt states "last year"  . Tobacco abuse     Patient Active Problem List   Diagnosis Date Noted  . Cerumen impaction 11/25/2016  . Routine general medical examination at a health care facility 11/25/2016  . Localization-related symptomatic epilepsy and epileptic syndromes with complex partial seizures, not intractable, without status epilepticus ([place]) 05/06/2016  . Intracranial vascular stenosis 05/06/2016  . History of stroke 05/06/2016  . Type 2 diabetes mellitus with hyperglycemia, with long-term current use of insulin ([place]) 12/04/2015  . History of shingles 09/08/2014  . Medically noncompliant 05/05/2014  . Leukocytosis 02/03/2014  . Chronic respiratory failure ([place]) 12/15/2013  . Syncope 11/28/2013  . Immunization not carried out because of patient refusal 07/05/2012  . HLD (hyperlipidemia)   . Pulmonary nodule,  right   . Allergic rhinitis   . ED (erectile dysfunction)   . Tobacco abuse   . COPD (chronic obstructive pulmonary disease) ([place]) 03/29/2010  . Essential hypertension 01/05/2007    Past Surgical History:  Procedure Laterality Date  . CERVICAL DISCECTOMY  1998  . EEG    . ETT  11/1994  . TM repair         Home Medications    Prior to Admission medications   Medication Sig Start Date End Date Taking? Authorizing Provider  albuterol (VENTOLIN HFA) 108 (90 BASE) MCG/ACT inhaler Inhale 2 puffs into the lungs every 6 (six) hours as needed for wheezing. 02/01/14  Yes Tower, Wynelle Fanny, MD  amLODipine (NORVASC) 5 MG tablet TAKE 1 TABLET BY MOUTH  DAILY 10/08/16  Yes Tower, Wynelle Fanny, MD  aspirin 81 MG tablet Take 1 tablet (81 mg total) by mouth daily. 11/26/13  Yes Ghimire, Henreitta Leber, MD  atorvastatin (LIPITOR) 10 MG tablet Take 1 tablet (10 mg total) by mouth daily. 07/14/17  Yes Tower, Marne A, MD  glipiZIDE (GLUCOTROL XL) 10 MG 24 hr tablet TAKE 1 TABLET IN THE  MORNING AND HALF A TABLET  BEFORE DINNER 04/29/17  Yes Philemon Kingdom, MD  insulin glargine (LANTUS) 100 unit/mL SOPN Inject 0.2 mLs (20 Units total) into the skin daily at 8 pm. 10/30/16  Yes Philemon Kingdom, MD  metFORMIN (GLUCOPHAGE)  1000 MG tablet TAKE 1 TABLET BY MOUTH IN  THE MORNING, ONE-HALF  TABLET MIDDAY, AND 1 TABLET IN THE EVENING 01/06/17  Yes Philemon Kingdom, MD  ACCU-CHEK AVIVA PLUS test strip USE TO CHECK BLOOD GLUCOSE LEVELS 3 TO 4TIMES DAILY 08/03/17   Philemon Kingdom, MD  albuterol (PROVENTIL) (2.5 MG/3ML) 0.083% nebulizer solution Take 3 mLs (2.5 mg total) by nebulization 2 (two) times daily as needed for shortness of breath. Patient not taking: Reported on 08/04/2017 11/26/13   Jonetta Osgood, MD  Blood Glucose Monitoring Suppl (ACCU-CHEK AVIVA PLUS) W/DEVICE KIT USE AS DIRECTED 12/13/14   Tower, Wynelle Fanny, MD  GLOBAL EASE INJECT PEN NEEDLES 32G X 4 MM MISC AS DIRECTED 06/03/16   Philemon Kingdom, MD    Family  History Family History  Problem Relation Age of Onset  . Asthma Sister   . Emphysema Sister   . Diabetes Mother   . Diabetes Brother   . Heart disease Brother     Social History Social History   Tobacco Use  . Smoking status: Current Every Day Smoker    Packs/day: 1.00    Types: Cigarettes  . Smokeless tobacco: Never Used  Substance Use Topics  . Alcohol use: No    Alcohol/week: 0.0 oz    Comment: Quit 40 years ago  . Drug use: No     Allergies   Amoxicillin-pot clavulanate; Quinapril hcl; Valsartan; and Varenicline tartrate   Review of Systems Review of Systems  Constitutional: Negative for activity change, fatigue and fever.  Respiratory: Positive for cough and shortness of breath.   Cardiovascular: Positive for chest pain.  Gastrointestinal: Negative for abdominal pain.  All other systems reviewed and are negative.    Physical Exam Updated Vital Signs BP 130/73 (BP Location: Right Arm)   Pulse (!) 106   Temp 97.7 F (36.5 C) (Oral)   Resp (!) 24   Ht 5' 7"  (1.702 m)   Wt 77.1 kg (170 lb)   SpO2 97%   BMI 26.63 kg/m   Physical Exam  Constitutional: He is oriented to person, place, and time. He appears well-nourished.  HENT:  Head: Normocephalic.  Eyes: Conjunctivae are normal.  Cardiovascular:  tachycardia  Pulmonary/Chest:  Tachypnea Diffuse wheezing   Abdominal: Soft. He exhibits no distension. There is no tenderness.  Neurological: He is oriented to person, place, and time.  Skin: Skin is warm and dry. He is not diaphoretic.  Psychiatric: He has a normal mood and affect. His behavior is normal.     ED Treatments / Results  Labs (all labs ordered are listed, but only abnormal results are displayed) Labs Reviewed  BASIC METABOLIC PANEL  CBC    EKG  EKG Interpretation None       Radiology No results found.  Procedures Procedures (including critical care time)  Medications Ordered in ED Medications  ipratropium-albuterol  (DUONEB) 0.5-2.5 (3) MG/3ML nebulizer solution 3 mL (not administered)  methylPREDNISolone sodium succinate (SOLU-MEDROL) 125 mg/2 mL injection 125 mg (not administered)  sodium chloride 0.9 % bolus 500 mL (not administered)  albuterol (PROVENTIL) (2.5 MG/3ML) 0.083% nebulizer solution 5 mg (5 mg Nebulization Given 08/04/17 1647)     Initial Impression / Assessment and Plan / ED Course  I have reviewed the triage vital signs and the nursing notes.  Pertinent labs & imaging results that were available during my care of the patient were reviewed by me and considered in my medical decision making (see chart for details).  Pt is a 76 yo male presenting with SOB.  Patient has COPD, currently still smokes.  This is been going on for 2-3 days.  Patient on 2.5 L at home.  On 3 L here patient is satting 95%.  However he reports increased shortness of breath.  Patient has history of hypertension hyperlipidemia.  She reports cough with increased sputum.   5:44 PM Patient is a 76 year old male with COPD on home oxygen presenting with increased cough sputum production shortness of breath.  Patient has diffuse wheezing.  Patient continues to smoke.  Will treat as COPD exacerbation.  9:17 PM Patient steroids, antibiotics, hour-long continuous neb.  Attempted to get patient in a good enough placed to be able to discharge home.  Patient still working to breathe.Treated for community acquired pneumonia but will require admission given his increased work of breathing, elevated heart rate.  Final Clinical Impressions(s) / ED Diagnoses   Final diagnoses:  None    ED Discharge Orders    None       Macarthur Critchley, MD 08/04/17 2118

## 2017-08-05 ENCOUNTER — Other Ambulatory Visit: Payer: Self-pay

## 2017-08-05 DIAGNOSIS — J9611 Chronic respiratory failure with hypoxia: Secondary | ICD-10-CM

## 2017-08-05 DIAGNOSIS — J181 Lobar pneumonia, unspecified organism: Secondary | ICD-10-CM

## 2017-08-05 DIAGNOSIS — Z720 Tobacco use: Secondary | ICD-10-CM

## 2017-08-05 DIAGNOSIS — J441 Chronic obstructive pulmonary disease with (acute) exacerbation: Principal | ICD-10-CM

## 2017-08-05 DIAGNOSIS — I10 Essential (primary) hypertension: Secondary | ICD-10-CM

## 2017-08-05 LAB — CBC WITH DIFFERENTIAL/PLATELET
Basophils Absolute: 0 10*3/uL (ref 0.0–0.1)
Basophils Relative: 0 %
Eosinophils Absolute: 0 10*3/uL (ref 0.0–0.7)
Eosinophils Relative: 0 %
HCT: 47.2 % (ref 39.0–52.0)
Hemoglobin: 15.5 g/dL (ref 13.0–17.0)
Lymphocytes Relative: 11 %
Lymphs Abs: 0.8 10*3/uL (ref 0.7–4.0)
MCH: 29.1 pg (ref 26.0–34.0)
MCHC: 32.8 g/dL (ref 30.0–36.0)
MCV: 88.6 fL (ref 78.0–100.0)
Monocytes Absolute: 0.1 10*3/uL (ref 0.1–1.0)
Monocytes Relative: 1 %
Neutro Abs: 6.5 10*3/uL (ref 1.7–7.7)
Neutrophils Relative %: 88 %
Platelets: 253 10*3/uL (ref 150–400)
RBC: 5.33 MIL/uL (ref 4.22–5.81)
RDW: 13.9 % (ref 11.5–15.5)
WBC: 7.3 10*3/uL (ref 4.0–10.5)

## 2017-08-05 LAB — GLUCOSE, CAPILLARY
Glucose-Capillary: 300 mg/dL — ABNORMAL HIGH (ref 65–99)
Glucose-Capillary: 240 mg/dL — ABNORMAL HIGH (ref 65–99)
Glucose-Capillary: 137 mg/dL — ABNORMAL HIGH (ref 65–99)
Glucose-Capillary: 287 mg/dL — ABNORMAL HIGH (ref 65–99)

## 2017-08-05 LAB — BASIC METABOLIC PANEL
Anion gap: 13 (ref 5–15)
BUN: 17 mg/dL (ref 6–20)
CO2: 26 mmol/L (ref 22–32)
Calcium: 8.7 mg/dL — ABNORMAL LOW (ref 8.9–10.3)
Chloride: 100 mmol/L — ABNORMAL LOW (ref 101–111)
Creatinine, Ser: 1.11 mg/dL (ref 0.61–1.24)
GFR calc Af Amer: >60 mL/min (ref >60)
GFR calc non Af Amer: >60 mL/min (ref >60)
Glucose, Bld: 313 mg/dL — ABNORMAL HIGH (ref 65–99)
Potassium: 4.4 mmol/L (ref 3.5–5.1)
Sodium: 139 mmol/L (ref 135–145)

## 2017-08-05 LAB — STREP PNEUMONIAE URINARY ANTIGEN: Strep Pneumo Urinary Antigen: NEGATIVE

## 2017-08-05 LAB — PROCALCITONIN: Procalcitonin: <0.1 ng/mL

## 2017-08-05 LAB — EXPECTORATED SPUTUM ASSESSMENT W GRAM STAIN, RFLX TO RESP C

## 2017-08-05 LAB — EXPECTORATED SPUTUM ASSESSMENT W REFEX TO RESP CULTURE

## 2017-08-05 MED ORDER — BUDESONIDE 0.5 MG/2ML IN SUSP
0.5000 mg | Freq: Two times a day (BID) | RESPIRATORY_TRACT | Status: DC
  Administered 2017-08-06: 0.5 mg via RESPIRATORY_TRACT
  Filled 2017-08-05 (×2): qty 2

## 2017-08-05 MED ORDER — METHYLPREDNISOLONE SODIUM SUCC 40 MG IJ SOLR: 40.0000 mg | Freq: Two times a day (BID) | INTRAMUSCULAR | Status: DC

## 2017-08-05 MED ORDER — METHYLPREDNISOLONE SODIUM SUCC 125 MG IJ SOLR
60.0000 mg | Freq: Two times a day (BID) | INTRAMUSCULAR | Status: DC
  Administered 2017-08-05 – 2017-08-06 (×3): 60 mg via INTRAVENOUS
  Filled 2017-08-05 (×3): qty 2

## 2017-08-05 MED ORDER — ALBUTEROL SULFATE (2.5 MG/3ML) 0.083% IN NEBU
2.5000 mg | INHALATION_SOLUTION | RESPIRATORY_TRACT | Status: DC | PRN
  Administered 2017-08-05: 2.5 mg via RESPIRATORY_TRACT
  Filled 2017-08-05: qty 3

## 2017-08-05 MED ORDER — ARFORMOTEROL TARTRATE 15 MCG/2ML IN NEBU
15.0000 ug | INHALATION_SOLUTION | Freq: Two times a day (BID) | RESPIRATORY_TRACT | Status: DC
  Administered 2017-08-05 – 2017-08-06 (×3): 15 ug via RESPIRATORY_TRACT
  Filled 2017-08-05 (×4): qty 2

## 2017-08-05 NOTE — Progress Notes (Signed)
PROGRESS NOTE [facility]   Jonathan [name]   VFI:[id number] DOB: November 17, 1940  DOA: 08/04/2017 PCP: Abner Greenspan, MD   Brief Narrative:  Jonathan [name] is a 76 year old male with medical history significant for insulin dependent diabetes mellitus, COPD with chronic hypoxic respiratory failure on oxygen dependent, tobacco abuse who presented to the emergency department complaining of cough and dyspnea that worsened for the past 2-3 days prior to admission.  Associated symptoms included productive cough and wheezing.  Upon ED examination patient was found to have right lower lobe infiltrate on chest x-ray concerning for pneumonia, white count elevated and saturating adequately on 3 L of oxygen which is his baseline.  Patient was admitted with working diagnosis of COPD exacerbation and possible pneumonia.  Subjective: Patient seen and examined, report breathing is better but still not at baseline.  He still having trouble to speak and feels a lot of wheezing.  Nebulizer treatment has helped some.  Patient had a febrile  Assessment & Plan: COPD exacerbation  In setting of viral pneumonia, and currently smoker. Patient continues to have inspiratory and expiratory wheezing.  Will add Brovana and Pulmicort, continue duo nebs.  Increase Solu-Medrol to 60 mg twice daily.  Continue oxygen supplementation.  Pro-Calcitonin was negative therefore will discontinue antibiotics.  Viral Pneumonia Chest x-ray consistent with right lower lobe infiltrate, pro-calcitonin is negative, this likely to be bacterial pneumonia.  Patient afebrile with only mild elevated white count.  Given pro-calcitonin negative antibiotics are being discontinued.  We will continue to monitor for now  HTN  BP at goal  Continue Norvasc  DM Type 2  A1c 7.91-year ago She is on Lantus 20 units nightly, glipizide and metformin. Holding oral agents while in hospital continue Lantus and sliding scale Get A1c  DVT prophylaxis:  Lovenox  Code Status: Full  Family Communication: None at bedside  Disposition Plan: Home in next 24-48 hrs    Consultants:   None  Procedures:   None  Antimicrobials:  Levaquin 11/6 OTO     Objective: Vitals:   08/04/17 2245 08/04/17 2335 08/05/17 0659 08/05/17 0807  BP: 123/68 (!) 128/53 111/63   Pulse: (!) 101 (!) 103 (!) 110   Resp: 14 (!) 24 (!) 24   Temp:  98.4 F (36.9 C) 98.2 F (36.8 C)   TempSrc:  Oral Oral   SpO2: 97% 96% 97% 97%  Weight:  77.9 kg (171 lb 11.8 oz)    Height:  5\' 7"  (1.702 m)      Intake/Output Summary (Last 24 hours) at 08/05/2017 1030 Last data filed at 08/05/2017 0700 Gross per 24 hour  Intake 1300 ml  Output 525 ml  Net 775 ml   Filed Weights   08/04/17 1642 08/04/17 2335  Weight: 77.1 kg (170 lb) 77.9 kg (171 lb 11.8 oz)    Examination:  General exam: Mild anxious due to mild distress HEENT: AC/AT, PERRLA, OP moist and clear Respiratory system: Decreased air entry throughout, diffuse inspiratory and expiratory wheezing, tachypneic, unable to speak in full sentences Cardiovascular system: S1 & S2 heard, RRR. No JVD, murmurs, rubs or gallops Gastrointestinal system: Abdomen is nondistended, soft and nontender. Normal bowel sounds heard. Central nervous system: Alert and oriented. No focal neurological deficits. Extremities: No pedal edema. Skin: No rashes, lesions or ulcers Psychiatry: Normal behavior, mood & affect appropriate.    Data Reviewed: I have personally reviewed following labs and imaging studies  CBC: Recent Labs  Lab 08/04/17 1713 08/05/17 0418  WBC 12.9* 7.3  NEUTROABS  --  6.5  HGB 17.0 15.5  HCT 50.9 47.2  MCV 88.8 88.6  PLT 294 371   Basic Metabolic Panel: Recent Labs  Lab 08/04/17 1713 08/05/17 0418  NA 137 139  K 4.7 4.4  CL 96* 100*  CO2 30 26  GLUCOSE 300* 313*  BUN 14 17  CREATININE 1.06 1.11  CALCIUM 9.0 8.7*   GFR: Estimated Creatinine Clearance: 52.1 mL/min (by C-G formula  based on SCr of 1.11 mg/dL). Liver Function Tests: No results for input(s): AST, ALT, ALKPHOS, BILITOT, PROT, ALBUMIN in the last 168 hours. No results for input(s): LIPASE, AMYLASE in the last 168 hours. No results for input(s): AMMONIA in the last 168 hours. Coagulation Profile: No results for input(s): INR, PROTIME in the last 168 hours. Cardiac Enzymes: No results for input(s): CKTOTAL, CKMB, CKMBINDEX, TROPONINI in the last 168 hours. BNP (last 3 results) No results for input(s): PROBNP in the last 8760 hours. HbA1C: No results for input(s): HGBA1C in the last 72 hours. CBG: Recent Labs  Lab 08/04/17 2204 08/05/17 0906  GLUCAP 273* 300*   Lipid Profile: No results for input(s): CHOL, HDL, LDLCALC, TRIG, CHOLHDL, LDLDIRECT in the last 72 hours. Thyroid Function Tests: No results for input(s): TSH, T4TOTAL, FREET4, T3FREE, THYROIDAB in the last 72 hours. Anemia Panel: No results for input(s): VITAMINB12, FOLATE, FERRITIN, TIBC, IRON, RETICCTPCT in the last 72 hours. Sepsis Labs: Recent Labs  Lab 08/05/17 0805  PROCALCITON <0.10    No results found for this or any previous visit (from the past 240 hour(s)).    Radiology Studies: Dg Chest 2 View  Result Date: 08/04/2017 CLINICAL DATA:  Pt complaint of worsening SOB since last night; denies cough/fever; hx of COPD.; diabetic; HTN; smoker EXAM: CHEST  2 VIEW COMPARISON:  02/03/2014 FINDINGS: Heart size is accentuated by the AP position of the patient. There is reticular change in the right lower lobe, compatible with infectious process. No focal consolidations. No pulmonary edema. Remote L1 compression fracture. IMPRESSION: Right lower lobe infiltrate. Followup PA and lateral chest X-ray is recommended in 3-4 weeks following trial of antibiotic therapy to ensure resolution and exclude underlying malignancy. Electronically Signed   By: Nolon Nations M.D.   On: 08/04/2017 18:18      Scheduled Meds: . amLODipine  5 mg Oral  Daily  . arformoterol  15 mcg Nebulization BID  . aspirin EC  81 mg Oral Daily  . atorvastatin  10 mg Oral q1800  . budesonide (PULMICORT) nebulizer solution  0.5 mg Nebulization BID  . enoxaparin (LOVENOX) injection  40 mg Subcutaneous QHS  . insulin aspart  0-15 Units Subcutaneous TID WC  . insulin aspart  0-5 Units Subcutaneous QHS  . insulin glargine  20 Units Subcutaneous QHS  . ipratropium-albuterol  3 mL Nebulization QID  . methylPREDNISolone (SOLU-MEDROL) injection  60 mg Intravenous Q12H   Continuous Infusions:   LOS: 1 day    Time spent: Total of 25 minutes spent with pt, greater than 50% of which was spent in discussion of  treatment, counseling and coordination of care    Chipper Oman, MD Pager: Text Page via [email or web address]   If 7PM-7AM, please contact night-coverage [email or web address] 08/05/2017, 10:30 AM

## 2017-08-05 NOTE — Evaluation (Signed)
Physical Therapy Evaluation Patient Details Name: Jonathan [name] MRN: [id number] DOB: Jan 18, 1941 Today's Date: 08/05/2017   History of Present Illness  Patient is a 76 y/o male admitted with SOB due to COPD exacerbation.  On 2-3L home O2 and h/o DM, HTN, strike and pulmonary nodule.  Clinical Impression  Patient presents with decreased endurance and safety with mobility and will benefit from skilled PT in the acute setting to allow return home with family/friend support.  Likely will not need follow up PT at d/c.     Follow Up Recommendations No PT follow up    Equipment Recommendations  None recommended by PT    Recommendations for Other Services       Precautions / Restrictions Precautions Precautions: Fall Precaution Comments: oxygen dependent      Mobility  Bed Mobility Overal bed mobility: Modified Independent                Transfers Overall transfer level: Needs assistance Equipment used: Rolling walker (2 wheeled) Transfers: Sit to/from Stand Sit to Stand: Min guard         General transfer comment: assist for safety  Ambulation/Gait Ambulation/Gait assistance: Min guard;Supervision Ambulation Distance (Feet): 20 Feet Assistive device: Rolling walker (2 wheeled) Gait Pattern/deviations: Step-through pattern;Decreased stride length     General Gait Details: some difficulty turning with walker due to lack of experience with walker; limited due to SOB SpO2 83% after ambulating on 2L O2, improved to 90% seated rest x 2 minutes on 3L O2  Stairs            Wheelchair Mobility    Modified Rankin (Stroke Patients Only)       Balance Overall balance assessment: Needs assistance   Sitting balance-Leahy Scale: Good       Standing balance-Leahy Scale: Fair Standing balance comment: stood second time without walker to doff gown and move up in bed                             Pertinent Vitals/Pain Pain Assessment: No/denies pain     Home Living Family/patient expects to be discharged to:: Private residence Living Arrangements: Spouse/significant other([place]) Available Help at Discharge: Family;Available 24 hours/day Type of Home: Mobile home Home Access: Stairs to enter Entrance Stairs-Rails: Can reach both Entrance Stairs-Number of Steps: 3 Home Layout: One level Home Equipment: Walker - 2 wheels;Cane - single point;Shower seat;Other (comment) Additional Comments: home O2    Prior Function Level of Independence: Independent with assistive device(s)         Comments: uses cane at times     Hand Dominance   Dominant Hand: Right    Extremity/Trunk Assessment   Upper Extremity Assessment Upper Extremity Assessment: Overall WFL for tasks assessed    Lower Extremity Assessment Lower Extremity Assessment: LLE deficits/detail;RLE deficits/detail RLE Deficits / Details: noted tight heel cords bilaterally otherwise, strength/ROM WFL LLE Deficits / Details: noted tight heel cords bilaterally otherwise, strength/ROM WFL       Communication   Communication: No difficulties  Cognition Arousal/Alertness: Awake/alert Behavior During Therapy: WFL for tasks assessed/performed Overall Cognitive Status: Within Functional Limits for tasks assessed                                        General Comments      Exercises     Assessment/Plan  PT Assessment Patient needs continued PT services  PT Problem List Decreased mobility;Decreased balance;Decreased activity tolerance       PT Treatment Interventions DME instruction;Therapeutic activities;Patient/family education;Therapeutic exercise;Gait training;Balance training;Stair training;Functional mobility training    PT Goals (Current goals can be found in the Care Plan section)  Acute Rehab PT Goals Patient Stated Goal: To go home PT Goal Formulation: With patient Time For Goal Achievement: 08/08/17 Potential to Achieve Goals:  Good    Frequency Min 3X/week   Barriers to discharge        Co-evaluation               AM-PAC PT "6 Clicks" Daily Activity  Outcome Measure Difficulty turning over in bed (including adjusting bedclothes, sheets and blankets)?: A Little Difficulty moving from lying on back to sitting on the side of the bed? : A Little Difficulty sitting down on and standing up from a chair with arms (e.g., wheelchair, bedside commode, etc,.)?: Unable Help needed moving to and from a bed to chair (including a wheelchair)?: A Little Help needed walking in hospital room?: A Little Help needed climbing 3-5 steps with a railing? : A Little 6 Click Score: 16    End of Session Equipment Utilized During Treatment: Gait belt;Oxygen Activity Tolerance: Patient limited by fatigue Patient left: in bed   PT Visit Diagnosis: Difficulty in walking, not elsewhere classified (R26.2)    Time: 1610-9604 PT Time Calculation (min) (ACUTE ONLY): 24 min   Charges:   PT Evaluation $PT Eval Moderate Complexity: 1 Mod PT Treatments $Gait Training: 8-22 mins   PT G CodesMagda Kiel, [place] [phone] 08/05/2017   Reginia Naas 08/05/2017, 1:21 PM

## 2017-08-05 NOTE — Progress Notes (Signed)
Initial Nutrition Assessment  INTERVENTION:   Provide daily snacks Encourage PO intake RD will continue to monitor  NUTRITION DIAGNOSIS:   Increased nutrient needs related to (COPD) as evidenced by estimated needs.  GOAL:   Patient will meet greater than or equal to 90% of their needs  MONITOR:   PO intake, Weight trends, Labs, I & O's  REASON FOR ASSESSMENT:   Consult COPD Protocol  ASSESSMENT:   76 y.o. male with medical history significant for hypertension, insulin-dependent diabetes mellitus, COPD with chronic hypoxic respiratory failure, and tobacco abuse, now presenting to the emergency department for evaluation of cough and dyspnea that developed 2-3 days ago and worsened significantly overnight.  Patient reports that he had been in his usual state of health until the insidious development of progressive dyspnea with cough productive of thick yellow sputum 2-3 days ago.  Last night, his condition began to worsen fairly acutely and he has been unable to achieve adequate relief with his home therapies.  Patient reports his breathing is better today. States that lately he has been so short of breath that he exhausts himself. States he has a hard time finishing meals, may consume ~50% of a meal before stopping d/t feeling too tired to finish.  Pt ate scrambled eggs and toast this morning. States he typically consumes 1 meal a day then snacks on crackers during the day.   Per chart review, pt has lost 10 lb since February 2018 (6% wt loss x 9 months, insignificant for time frame).   Medications reviewed. Labs reviewed: CBGs: 240-300   NUTRITION - FOCUSED PHYSICAL EXAM:  Nutrition focused physical exam shows no sign of depletion of muscle mass or body fat.  Diet Order:  Diet heart healthy/carb modified Room service appropriate? Yes; Fluid consistency: Thin  EDUCATION NEEDS:   No education needs have been identified at this time  Skin:  Skin Assessment: Reviewed RN  Assessment  Last BM:  11/7  Height:   Ht Readings from Last 1 Encounters:  08/04/17 5\' 7"  (1.702 m)    Weight:   Wt Readings from Last 1 Encounters:  08/04/17 171 lb 11.8 oz (77.9 kg)    Ideal Body Weight:  67.2 kg  BMI:  Body mass index is 26.9 kg/m.  Estimated Nutritional Needs:   Kcal:  2100-2300  Protein:  105-115g  Fluid:  2.1L/day  Clayton Bibles, MS, RD, LDN [place] Diet[facility] Pager: [phone] After Hours Pager: [phone]

## 2017-08-05 NOTE — Progress Notes (Signed)
Acknowledged consult for COPD gold protocol- reviewed chart. Does not meet COPD gold criteria due to less than 3 related inpatient admissions within past 6 months.  Sharren Bridge, MSW, LCSW Clinical Social Work 08/05/2017 [phone]

## 2017-08-06 DIAGNOSIS — Z794 Long term (current) use of insulin: Secondary | ICD-10-CM

## 2017-08-06 DIAGNOSIS — E1165 Type 2 diabetes mellitus with hyperglycemia: Secondary | ICD-10-CM

## 2017-08-06 LAB — GLUCOSE, CAPILLARY
Glucose-Capillary: 266 mg/dL — ABNORMAL HIGH (ref 65–99)
Glucose-Capillary: 215 mg/dL — ABNORMAL HIGH (ref 65–99)

## 2017-08-06 MED ORDER — ALBUTEROL SULFATE HFA 108 (90 BASE) MCG/ACT IN AERS: 2.0000 | INHALATION_SPRAY | Freq: Four times a day (QID) | RESPIRATORY_TRACT | 0 refills | Status: DC | PRN

## 2017-08-06 MED ORDER — ALBUTEROL SULFATE (2.5 MG/3ML) 0.083% IN NEBU
2.5000 mg | INHALATION_SOLUTION | RESPIRATORY_TRACT | 0 refills | Status: DC | PRN
Start: 2017-08-06 — End: 2020-05-23

## 2017-08-06 MED ORDER — DM-GUAIFENESIN ER 30-600 MG PO TB12: 1.0000 | ORAL_TABLET | Freq: Two times a day (BID) | ORAL | 0 refills | Status: DC

## 2017-08-06 MED ORDER — PREDNISONE 10 MG PO TABS: ORAL_TABLET | ORAL | 0 refills | Status: DC

## 2017-08-06 MED ORDER — DM-GUAIFENESIN ER 30-600 MG PO TB12
1.0000 | ORAL_TABLET | Freq: Two times a day (BID) | ORAL | Status: DC
  Administered 2017-08-06: 1 via ORAL
  Filled 2017-08-06: qty 1

## 2017-08-06 MED ORDER — FLUTICASONE FUROATE-VILANTEROL 100-25 MCG/INH IN AEPB: 1.0000 | INHALATION_SPRAY | Freq: Every day | RESPIRATORY_TRACT | 0 refills | Status: DC

## 2017-08-06 NOTE — Discharge Summary (Addendum)
Physician Discharge Summary  Jonathan [name]  IOE:[id number]  DOB: [birth year]  DOA: 08/04/2017 PCP: Abner Greenspan, MD  Admit date: 08/04/2017 Discharge date: 08/06/2017  Admitted From: Home  Disposition:  Home  Recommendations for Outpatient Follow-up:  1. Follow up with PCP in 1 week  2. Please obtain BMP/CBC in one week to monitor Hgb and renal function  3. May need sleep study  4. 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  Associated symptoms included productive cough and wheezing.  Upon ED examination patient was found to have right lower lobe infiltrate on chest x-ray concerning for pneumonia, white count elevated and saturating adequately on 3 L of oxygen which is his baseline.  Patient was admitted with working diagnosis of COPD exacerbation and possible pneumonia.  Subjective: Patient seen and examined, significantly improved, able to speak in full sentences with difficulty. Participating with PT w/o any issues. No acute events overnight. Remains afebrile   Discharge Diagnoses/Hospital Course:  COPD exacerbation  In setting of viral pneumonia, and currently smoker. Patient was treated with IV solumedrol, Duoneb, Brovana and Pulmicort. Patient was ambulated and oxygen supplementation remained at baseline. Will d/c patient on Breo-Ellipta once a day. Prednisone taper for 14 days, Mucinex and albuterol PRN. Continue oxygen supplementation. Patient advised to follow up  with PCP for sleep study, patient may benefit from a CPAP at night. Smoking cessation discussed. Patient not interested on nicotine patch.   Viral Pneumonia Chest x-ray consistent with right lower lobe infiltrate, pro-calcitonin is negative, this unlikely to be bacterial pneumonia.  Patient afebrile with only mild elevated white count.  Given pro-calcitonin negative antibiotics were discontinued. Repeat CXR in 3-4 week to assure resolution of CXR findings   HTN  BP at goal during hospital stay  Continue Norvasc no changes in medications were made   DM Type 2  CBG's slightly elevated during hospital stay due to steroids and holding oral hypoglycemic agents  Continue home Lantus, Resume Metformin and Glipizide  Follow up with PCP   Tobacco abuse  Smoking cessation discussed. Patient not interested on nicotine patch.   All other chronic medical condition were stable during the hospitalization.  Patient was seen by physical therapy, recommending no follow up  On the day of the discharge the patient's vitals were stable, and no other acute medical condition were reported by patient. the patient was felt safe to be discharge to home   Discharge Instructions  You were cared for by a hospitalist during your hospital stay. If you have any questions about your discharge medications or the care you received while you were in the hospital after you are discharged, you can call the unit and asked to speak with the hospitalist on call if the hospitalist that took care of you is not available. Once you are discharged, your primary care physician will handle any further medical issues. Please note that NO REFILLS for any discharge medications will be authorized once you are discharged, as it is imperative that you return to your  primary care physician (or establish a relationship with a primary care physician if you do not have one) for your aftercare needs so that they can reassess your need for  medications and monitor your lab values.  Discharge Instructions    Call MD for:  difficulty breathing, headache or visual disturbances   Complete by:  As directed    Call MD for:  hives   Complete by:  As directed    Call MD for:  persistant dizziness or light-headedness   Complete by:  As directed    Call MD for:  persistant nausea and vomiting   Complete by:  As directed    Call MD for:  redness, tenderness, or signs of infection (pain, swelling, redness, odor or green/yellow discharge around incision site)   Complete by:  As directed    Call MD for:  severe uncontrolled pain   Complete by:  As directed    Call MD for:  temperature >100.4   Complete by:  As directed    Diet - low sodium heart healthy   Complete by:  As directed    Increase activity slowly   Complete by:  As directed      Allergies as of 08/06/2017      Reactions   Amoxicillin-pot Clavulanate Other (See Comments)   Severe stomach pain.    Quinapril Hcl    REACTION: back pain   Valsartan    REACTION: back pain   Varenicline Tartrate    REACTION: AMS, hallucinations, night mares      Medication List    TAKE these medications   ACCU-CHEK AVIVA PLUS test strip Generic drug:  glucose blood USE TO CHECK BLOOD GLUCOSE LEVELS 3 TO 4TIMES DAILY   ACCU-CHEK AVIVA PLUS w/Device Kit USE AS DIRECTED   albuterol (2.5 MG/3ML) 0.083% nebulizer solution Commonly known as:  PROVENTIL Take 3 mLs (2.5 mg total) every 4 (four) hours as needed by nebulization for shortness of breath. What changed:  when to take this   albuterol 108 (90 Base) MCG/ACT inhaler Commonly known as:  VENTOLIN HFA Inhale 2 puffs every 6 (six) hours as needed into the lungs for wheezing. What changed:  Another medication with the same name was changed. Make sure you understand how and when to take each.   amLODipine 5 MG tablet Commonly known as:  NORVASC TAKE 1 TABLET BY MOUTH  DAILY   aspirin 81 MG tablet Take 1 tablet (81 mg total)  by mouth daily.   atorvastatin 10 MG tablet Commonly known as:  LIPITOR Take 1 tablet (10 mg total) by mouth daily.   dextromethorphan-guaiFENesin 30-600 MG 12hr tablet Commonly known as:  MUCINEX DM Take 1 tablet 2 (two) times daily by mouth.   fluticasone furoate-vilanterol 100-25 MCG/INH Aepb Commonly known as:  BREO ELLIPTA Inhale 1 puff daily into the lungs.   glipiZIDE 10 MG 24 hr tablet Commonly known as:  GLUCOTROL XL TAKE 1 TABLET IN THE  MORNING AND HALF A TABLET  BEFORE DINNER   GLOBAL EASE INJECT PEN NEEDLES 32G X 4 MM Misc Generic drug:  Insulin Pen Needle AS DIRECTED   insulin glargine 100 unit/mL Sopn Commonly known as:  LANTUS Inject 0.2 mLs (20 Units total) into the skin daily at 8 pm.   metFORMIN 1000 MG tablet Commonly known as:  GLUCOPHAGE TAKE 1 TABLET BY MOUTH IN  THE MORNING, ONE-HALF  TABLET MIDDAY, AND 1 TABLET IN THE EVENING   predniSONE 10 MG tablet Commonly  known as:  DELTASONE Take 4 tablets for 3 days; Take 3 tablets for 4 days; Take 2 tablets for 3 days; Take 1 tablet for 4 days       Allergies  Allergen Reactions  . Amoxicillin-Pot Clavulanate Other (See Comments)    Severe stomach pain.   . Quinapril Hcl     REACTION: back pain  . Valsartan     REACTION: back pain  . Varenicline Tartrate     REACTION: AMS, hallucinations, night mares    Consultations:  None    Procedures/Studies: Dg Chest 2 View  Result Date: 08/04/2017 CLINICAL DATA:  Pt complaint of worsening SOB since last night; denies cough/fever; hx of COPD.; diabetic; HTN; smoker EXAM: CHEST  2 VIEW COMPARISON:  02/03/2014 FINDINGS: Heart size is accentuated by the AP position of the patient. There is reticular change in the right lower lobe, compatible with infectious process. No focal consolidations. No pulmonary edema. Remote L1 compression fracture. IMPRESSION: Right lower lobe infiltrate. Followup PA and lateral chest X-ray is recommended in 3-4 weeks following  trial of antibiotic therapy to ensure resolution and exclude underlying malignancy. Electronically Signed   By: Nolon Nations M.D.   On: 08/04/2017 18:18     Discharge Exam: Vitals:   08/06/17 1142 08/06/17 1434  BP:    Pulse:    Resp:    Temp:    SpO2: 96% (P) 95%   Vitals:   08/06/17 0434 08/06/17 0750 08/06/17 1142 08/06/17 1434  BP: 119/74     Pulse: 97     Resp: 18     Temp: 97.9 F (36.6 C)     TempSrc: Oral     SpO2: 97% 98% 96% (P) 95%  Weight: 76 kg (167 lb 8.8 oz)     Height:        General: Pt is alert, awake, not in acute distress Cardiovascular: RRR, S1/S2 +, no rubs, no gallops Respiratory: Air entry significantly improved, no wheezing, + cough. No crackles  Abdominal: Soft, NT, ND, bowel sounds + Extremities: no edema  The results of significant diagnostics from this hospitalization (including imaging, microbiology, ancillary and laboratory) are listed below for reference.     Microbiology: Recent Results (from the past 240 hour(s))  Culture, blood (routine x 2) Call MD if unable to obtain prior to antibiotics being given     Status: None (Preliminary result)   Collection Time: 08/04/17  9:44 PM  Result Value Ref Range Status   Specimen Description BLOOD LEFT WRIST  Final   Special Requests   Final    BOTTLES DRAWN AEROBIC AND ANAEROBIC Blood Culture adequate volume   Culture   Final    NO GROWTH 2 DAYS Performed at [facility], [address], [place], [place] [address]    Report Status PENDING  Incomplete  Culture, blood (routine x 2) Call MD if unable to obtain prior to antibiotics being given     Status: None (Preliminary result)   Collection Time: 08/04/17 11:37 PM  Result Value Ref Range Status   Specimen Description BLOOD RIGHT ANTECUBITAL  Final   Special Requests   Final    BOTTLES DRAWN AEROBIC ONLY Blood Culture adequate volume   Culture   Final    NO GROWTH 1 DAY Performed at [facility], [address],  [place], [place] [address]    Report Status PENDING  Incomplete  Culture, sputum-assessment     Status: None   Collection  Time: 08/05/17 11:10 AM  Result Value Ref Range Status   Specimen Description EXPECTORATED SPUTUM  Final   Special Requests NONE  Final   Sputum evaluation   Final    Sputum specimen not acceptable for testing.  Please recollect.   INFORMED RN @ 6301 ON 11.7.18 BY [facility]    Report Status 08/05/2017 FINAL  Final     Labs: BNP (last 3 results) No results for input(s): BNP in the last 8760 hours. Basic Metabolic Panel: Recent Labs  Lab 08/04/17 1713 08/05/17 0418  NA 137 139  K 4.7 4.4  CL 96* 100*  CO2 30 26  GLUCOSE 300* 313*  BUN 14 17  CREATININE 1.06 1.11  CALCIUM 9.0 8.7*   Liver Function Tests: No results for input(s): AST, ALT, ALKPHOS, BILITOT, PROT, ALBUMIN in the last 168 hours. No results for input(s): LIPASE, AMYLASE in the last 168 hours. No results for input(s): AMMONIA in the last 168 hours. CBC: Recent Labs  Lab 08/04/17 1713 08/05/17 0418  WBC 12.9* 7.3  NEUTROABS  --  6.5  HGB 17.0 15.5  HCT 50.9 47.2  MCV 88.8 88.6  PLT 294 253   Cardiac Enzymes: No results for input(s): CKTOTAL, CKMB, CKMBINDEX, TROPONINI in the last 168 hours. BNP: Invalid input(s): POCBNP CBG: Recent Labs  Lab 08/05/17 1208 08/05/17 1744 08/05/17 2059 08/06/17 0749 08/06/17 1230  GLUCAP 240* 137* 287* 266* 215*   D-Dimer No results for input(s): DDIMER in the last 72 hours. Hgb A1c No results for input(s): HGBA1C in the last 72 hours. Lipid Profile No results for input(s): CHOL, HDL, LDLCALC, TRIG, CHOLHDL, LDLDIRECT in the last 72 hours. Thyroid function studies No results for input(s): TSH, T4TOTAL, T3FREE, THYROIDAB in the last 72 hours.  Invalid input(s): FREET3 Anemia work up No results for input(s): VITAMINB12, FOLATE, FERRITIN, TIBC, IRON, RETICCTPCT in the last 72 hours. Urinalysis    Component Value Date/Time   COLORURINE YELLOW  12/14/2013 1652   APPEARANCEUR CLEAR 12/14/2013 1652   LABSPEC 1.024 12/14/2013 1652   PHURINE 6.5 12/14/2013 1652   GLUCOSEU NEGATIVE 12/14/2013 1652   HGBUR NEGATIVE 12/14/2013 1652   HGBUR large 08/30/2007 1447   BILIRUBINUR neg. 02/21/2014 1503   KETONESUR NEGATIVE 12/14/2013 1652   PROTEINUR Trace 02/21/2014 1503   PROTEINUR NEGATIVE 12/14/2013 1652   UROBILINOGEN 0.2 02/21/2014 1503   UROBILINOGEN 1.0 12/14/2013 1652   NITRITE neg. 02/21/2014 1503   NITRITE NEGATIVE 12/14/2013 1652   LEUKOCYTESUR Trace 02/21/2014 1503   Sepsis Labs Invalid input(s): PROCALCITONIN,  WBC,  LACTICIDVEN Microbiology Recent Results (from the past 240 hour(s))  Culture, blood (routine x 2) Call MD if unable to obtain prior to antibiotics being given     Status: None (Preliminary result)   Collection Time: 08/04/17  9:44 PM  Result Value Ref Range Status   Specimen Description BLOOD LEFT WRIST  Final   Special Requests   Final    BOTTLES DRAWN AEROBIC AND ANAEROBIC Blood Culture adequate volume   Culture   Final    NO GROWTH 2 DAYS Performed at [facility], [place] [address], [place], [place] [address]    Report Status PENDING  Incomplete  Culture, blood (routine x 2) Call MD if unable to obtain prior to antibiotics being given     Status: None (Preliminary result)   Collection Time: 08/04/17 11:37 PM  Result Value Ref Range Status   Specimen Description BLOOD RIGHT ANTECUBITAL  Final   Special Requests  Final    BOTTLES DRAWN AEROBIC ONLY Blood Culture adequate volume   Culture   Final    NO GROWTH 1 DAY Performed at [facility], [place] [address] [place], [place] [address]    Report Status PENDING  Incomplete  Culture, sputum-assessment     Status: None   Collection Time: 08/05/17 11:10 AM  Result Value Ref Range Status   Specimen Description EXPECTORATED SPUTUM  Final   Special Requests NONE  Final   Sputum evaluation   Final    Sputum specimen not acceptable for testing.   Please recollect.   INFORMED RN @ 9927 ON 11.7.18 BY [facility]    Report Status 08/05/2017 FINAL  Final     Time coordinating discharge: 35 minutes  SIGNED:  Chipper Oman, MD  [facility] 08/06/2017, 3:09 PM  Pager please text page via  [email or web address] Password [email or web address]

## 2017-08-06 NOTE — Progress Notes (Signed)
Patient discharged to home with family, discharge instructions reviewed with patient who verbalized understanding. New RX given to patient. 

## 2017-08-06 NOTE — Evaluation (Signed)
Occupational Therapy Evaluation Patient Details Name: Jonathan [name] MRN: [id number] DOB: September 14, 1941 Today's Date: 08/06/2017    History of Present Illness Patient is a 75 y/o male admitted with SOB due to COPD exacerbation.  On 2-3L home O2 and h/o DM, HTN, strike and pulmonary nodule.   Clinical Impression   Pt admitted with the above diagnosis and has the deficits listed below. Pt would benefit from cont OT to address efficiency with adls and energy conservation techniques to make adls simpler at home so he can make it through the day with some energy.  Pt most likely will not need HHOT and has 24 hour S although his significant other also suffers with COPD.    Follow Up Recommendations  No OT follow up;Supervision/Assistance - 24 hour    Equipment Recommendations  Other (comment)(make sure his walker is a rollator)    Recommendations for Other Services       Precautions / Restrictions Precautions Precautions: Fall Precaution Comments: oxygen dependent Restrictions Weight Bearing Restrictions: No      Mobility Bed Mobility Overal bed mobility: Modified Independent                Transfers Overall transfer level: Needs assistance Equipment used: Rolling walker (2 wheeled) Transfers: Sit to/from Stand Sit to Stand: Min guard         General transfer comment: assist for safety    Balance Overall balance assessment: Needs assistance   Sitting balance-Leahy Scale: Good     Standing balance support: During functional activity;Bilateral upper extremity supported Standing balance-Leahy Scale: Fair Standing balance comment: can stand with without the walker but when fatigued would benefit from rollator.                           ADL either performed or assessed with clinical judgement   ADL Overall ADL's : Needs assistance/impaired Eating/Feeding: Independent;Sitting   Grooming: Supervision/safety;Standing Grooming Details (indicate cue type  and reason): requires rest breaks to sit Upper Body Bathing: Set up;Sitting   Lower Body Bathing: Min guard;Sit to/from stand Lower Body Bathing Details (indicate cue type and reason): rest breaks needed. Has shower chair.  Encourged pt to use it to save energy at home. Upper Body Dressing : Set up;Sitting   Lower Body Dressing: Min guard;Sit to/from stand Lower Body Dressing Details (indicate cue type and reason): pt mildly unsteady when first standing and fatigues quickly when standing.  Pt states his rolling walker is a rollator and has a chair.  Encouraged pt to use this when first home so he has a chair with him when he fatigues. Toilet Transfer: Min Retail banker and Hygiene: Supervision/safety;Sitting/lateral lean       Functional mobility during ADLs: Min guard;Rolling walker General ADL Comments: Pt mildly uncomfortable using walker because he would rather not use it but did well with verbal cues. Pt able to do most adls, just limited due to fatigue.     Vision Baseline Vision/History: No visual deficits Patient Visual Report: No change from baseline Vision Assessment?: No apparent visual deficits     Perception     Praxis      Pertinent Vitals/Pain Pain Assessment: No/denies pain     Hand Dominance Right   Extremity/Trunk Assessment Upper Extremity Assessment Upper Extremity Assessment: Overall WFL for tasks assessed   Lower Extremity Assessment Lower Extremity Assessment: Defer to PT evaluation   Cervical / Trunk Assessment Cervical / Trunk  Assessment: Normal   Communication Communication Communication: No difficulties   Cognition Arousal/Alertness: Awake/alert Behavior During Therapy: WFL for tasks assessed/performed Overall Cognitive Status: Within Functional Limits for tasks assessed                                     General Comments  Pt resistant to working but will with some postiive  encouragement.    Exercises     Shoulder Instructions      Home Living Family/patient expects to be discharged to:: Private residence Living Arrangements: Spouse/significant other Available Help at Discharge: Family;Available 24 hours/day Type of Home: Mobile home Home Access: Stairs to enter Entrance Stairs-Number of Steps: 3 Entrance Stairs-Rails: Can reach both Home Layout: One level     Bathroom Shower/Tub: Teacher, early years/pre: Standard     Home Equipment: Environmental consultant - 2 wheels;Cane - single point;Shower seat;Other (comment)   Additional Comments: home O2      Prior Functioning/Environment Level of Independence: Independent with assistive device(s)        Comments: uses cane at times outside and is independent driving.        OT Problem List: Impaired balance (sitting and/or standing);Decreased knowledge of use of DME or AE;Cardiopulmonary status limiting activity      OT Treatment/Interventions: Self-care/ADL training;Energy conservation;DME and/or AE instruction    OT Goals(Current goals can be found in the care plan section) Acute Rehab OT Goals Patient Stated Goal: To go home OT Goal Formulation: With patient Time For Goal Achievement: 08/20/17 Potential to Achieve Goals: Good ADL Goals Pt Will Perform Lower Body Dressing: with modified independence;sit to/from stand Pt Will Perform Tub/Shower Transfer: Tub transfer;with modified independence;ambulating;shower seat Additional ADL Goal #1: Pt will state 3 energy conservation tools he can use at home to be more efficient with adls in the  home environment without cues. Additional ADL Goal #2: Pt will walk to bathroom and complete all toileting with a walker and mod I.  OT Frequency: Min 2X/week   Barriers to D/C:            Co-evaluation              AM-PAC PT "6 Clicks" Daily Activity     Outcome Measure Help from another person eating meals?: None Help from another person taking  care of personal grooming?: None Help from another person toileting, which includes using toliet, bedpan, or urinal?: A Little Help from another person bathing (including washing, rinsing, drying)?: A Little Help from another person to put on and taking off regular upper body clothing?: None Help from another person to put on and taking off regular lower body clothing?: A Little 6 Click Score: 21   End of Session Equipment Utilized During Treatment: Rolling walker;Oxygen Nurse Communication: Mobility status  Activity Tolerance: Patient limited by fatigue Patient left: in bed;with call bell/phone within reach  OT Visit Diagnosis: Unsteadiness on feet (R26.81)                Time: 0923-3007 OT Time Calculation (min): 12 min Charges:  OT General Charges $OT Visit: 1 Visit OT Evaluation $OT Eval Moderate Complexity: 1 Mod G-Codes:     Jinger Neighbors, OTR/L  Glenford Peers 08/06/2017, 9:30 AM

## 2017-08-06 NOTE — Progress Notes (Signed)
Pt from home with significant other. Uses [facility] for home 02. Pt needs travel 02 tank for ride home. [facility] rep alerted of request. Marney Doctor RN,BSN,NCM [phone]

## 2017-08-07 MED FILL — Insulin Aspart Inj 100 Unit/ML: SUBCUTANEOUS | Qty: 1 | Status: AC

## 2017-08-09 LAB — CULTURE, BLOOD (ROUTINE X 2)
Culture: NO GROWTH
Special Requests: ADEQUATE

## 2017-08-10 LAB — CULTURE, BLOOD (ROUTINE X 2)
Culture: NO GROWTH
Special Requests: ADEQUATE

## 2017-08-14 ENCOUNTER — Encounter: Payer: Self-pay | Admitting: Internal Medicine

## 2017-08-14 ENCOUNTER — Ambulatory Visit (INDEPENDENT_AMBULATORY_CARE_PROVIDER_SITE_OTHER): Payer: Medicare Other | Admitting: Internal Medicine

## 2017-08-14 VITALS — BP 90/58 | HR 100 | Temp 98.0°F | Ht 67.0 in | Wt 172.0 lb

## 2017-08-14 DIAGNOSIS — E1165 Type 2 diabetes mellitus with hyperglycemia: Secondary | ICD-10-CM | POA: Diagnosis not present

## 2017-08-14 DIAGNOSIS — E785 Hyperlipidemia, unspecified: Secondary | ICD-10-CM | POA: Diagnosis not present

## 2017-08-14 DIAGNOSIS — Z794 Long term (current) use of insulin: Secondary | ICD-10-CM

## 2017-08-14 LAB — POCT GLYCOSYLATED HEMOGLOBIN (HGB A1C): Hemoglobin A1C: 11

## 2017-08-14 MED ORDER — INSULIN LISPRO 100 UNIT/ML (KWIKPEN): 4.0000 [IU] | PEN_INJECTOR | Freq: Three times a day (TID) | SUBCUTANEOUS | 11 refills | Status: DC

## 2017-08-14 NOTE — Progress Notes (Signed)
Patient ID: Jonathan [name], male   DOB: 25-Sep-1941, 76 y.o.   MRN: [id number]  Jonathan [name] is a 76 y.o.-year-old male, returning for follow up management of DM2, dx 1990, uncontrolled, insulin-dep since ~4098, without complications (h/o stroke, also mild CKD). Last visit 3 mo ago. He is here with his wife who offers part of the hx, especially related to his PMH and sugar checks.  He was in the hospital with PNA 07/2017. He started Prednisone 2 weeks ago  -now on 30 mg daily. Sugars are in the 200s after he started the steroid.  His wife has stage 3 lung cancer >> ChTx. Pt is understandably stressed.  Last hemoglobin A1c was: 05/14/2017: HbA1c calculated from the fructosamine is 7.4% 01/27/2017: HbA1c calculated from the fructosamine is great: 6.2% 10/30/2016: HbA1c calculated from the fructosamine is great, at 6.5% 07/31/2016: HbA1c calculated from the fructosamine is great, at 6.15% Lab Results  Component Value Date   HGBA1C 7.9 07/31/2016   HGBA1C 7.9 04/29/2016   HGBA1C 7.4 12/04/2015   He is on: - Glipizide XL 10 mg in am - Metformin 1000 mg 2x a day  - Lantus 20 units in am He also has at home Humalog if FS>200 (uses this seldom)  Checks sugars 3x a day: - am:  85-123 >> 79-90s >> 89-126, 147, 157 >> 87-167 >> 129-268 - 2h after b'fast: n/c  - lunch: 130-160 >> 122-203, 277 >> 92-252, 340 >> 157-296 - 2h after lunch: n/c - dinner:  81-128 >> 88-133, 158 (late lunch) >> n/c  - 2h after dinner:  160-170s >> 138-184, 133 >> 139-280, 373 >> 173-284 - bedtime: n/c Highest: 178 >> 288 >> 373 >> 296 Lowest 75 >> 89 >> 87 >> 129 Sugars improved after stopping Lamictal.  + stage 2-3 CKD, last BUN/creatinine: Lab Results  Component Value Date   BUN 17 08/05/2017   CREATININE 1.11 08/05/2017   Lab Results  Component Value Date   GFR 74.54 11/25/2016   MICRALBCREAT 7.6 09/15/2016  He is allergic toACE inhibitor's an ARB's.  Last ACR: Lab Results  Component Value Date    MICRALBCREAT 7.6 09/15/2016   MICRALBCREAT 12.7 04/28/2014   MICRALBCREAT 25.3 12/17/2009   MICRALBCREAT 22.6 02/13/2009   - + HL: Lab Results  Component Value Date   CHOL 181 11/25/2016   HDL 41.70 11/25/2016   LDLCALC 58 09/01/2014   LDLDIRECT 106.0 11/25/2016   TRIG 276.0 (H) 11/25/2016   CHOLHDL 4 11/25/2016  On Lipitor. On ASA 81.  - Last eye exam was in 2015 >> No DR - no numbness and tingling in feet.  Still smokes 1 PPD.  * Around early 2015, he was hospitalized after syncope, was in ICU, was on assisted ventilation, expressive aphasia recovered- believed to have a stroke, now better.    He is unstable on his feet because of back pain.  ROS: Constitutional: + weight loss, no fatigue, no subjective hyperthermia, no subjective hypothermia, + nocturia Eyes: no blurry vision, no xerophthalmia ENT: no sore throat, no nodules palpated in throat, no dysphagia, no odynophagia, no hoarseness Cardiovascular: no CP/no SOB/no palpitations/no leg swelling Respiratory: + cough/+ SOB/+ wheezing Gastrointestinal: no N/no V/no D/no C/no acid reflux Musculoskeletal: + muscle aches/+ joint aches Skin: no rashes, no hair loss Neurological: no tremors/no numbness/no tingling/no dizziness  I reviewed pt's medications, allergies, PMH, social hx, family hx, and changes were documented in the history of present illness. Otherwise, unchanged from my initial visit note.  Past Medical History:  Diagnosis Date  . Allergic rhinitis   . Asthma   . COPD (chronic obstructive pulmonary disease) ([place])   . Dermatitis seborrheica   . Diabetes mellitus type II   . ED (erectile dysfunction)   . Frozen shoulder   . HLD (hyperlipidemia)   . HTN (hypertension)   . Hyperkalemia   . Kidney stone   . Other diseases of lung, not elsewhere classified   . Pulmonary nodule, right    lower lobe  . Rotator cuff injury    right  . Shoulder pain   . Stroke [facility])    pt states "last year"  . Tobacco  abuse    Past Surgical History:  Procedure Laterality Date  . CERVICAL DISCECTOMY  1998  . EEG    . ETT  11/1994  . TM repair    . TRANSESOPHAGEAL ECHOCARDIOGRAM (TEE) N/A 12/03/2011   Performed by Lelon Perla, MD at [facility] History   Socioeconomic History  . Marital status: Married    Spouse name: Not on file  . Number of children: Not on file  . Years of education: Not on file  . Highest education level: Not on file  Social Needs  . Financial resource strain: Not on file  . Food insecurity - worry: Not on file  . Food insecurity - inability: Not on file  . Transportation needs - medical: Not on file  . Transportation needs - non-medical: Not on file  Occupational History  . Occupation: Retired    Comment: Air cabin crew  Tobacco Use  . Smoking status: Current Every Day Smoker    Packs/day: 1.00    Types: Cigarettes  . Smokeless tobacco: Never Used  Substance and Sexual Activity  . Alcohol use: No    Alcohol/week: 0.0 oz    Comment: Quit 40 years ago  . Drug use: No  . Sexual activity: No    Partners: Female  Other Topics Concern  . Not on file  Social History Narrative  . Not on file   Current Outpatient Medications on File Prior to Visit  Medication Sig Dispense Refill  . ACCU-CHEK AVIVA PLUS test strip USE TO CHECK BLOOD GLUCOSE LEVELS 3 TO 4TIMES DAILY 200 each 2  . albuterol (PROVENTIL) (2.5 MG/3ML) 0.083% nebulizer solution Take 3 mLs (2.5 mg total) every 4 (four) hours as needed by nebulization for shortness of breath. 75 mL 0  . albuterol (VENTOLIN HFA) 108 (90 Base) MCG/ACT inhaler Inhale 2 puffs every 6 (six) hours as needed into the lungs for wheezing. 1 Inhaler 0  . amLODipine (NORVASC) 5 MG tablet TAKE 1 TABLET BY MOUTH  DAILY 90 tablet 2  . aspirin 81 MG tablet Take 1 tablet (81 mg total) by mouth daily. 30 tablet 0  . atorvastatin (LIPITOR) 10 MG tablet Take 1 tablet (10 mg total) by mouth daily. 90 tablet 1  . Blood Glucose  Monitoring Suppl (ACCU-CHEK AVIVA PLUS) W/DEVICE KIT USE AS DIRECTED 1 kit 0  . dextromethorphan-guaiFENesin (MUCINEX DM) 30-600 MG 12hr tablet Take 1 tablet 2 (two) times daily by mouth. 30 tablet 0  . fluticasone furoate-vilanterol (BREO ELLIPTA) 100-25 MCG/INH AEPB Inhale 1 puff daily into the lungs. 60 each 0  . glipiZIDE (GLUCOTROL XL) 10 MG 24 hr tablet TAKE 1 TABLET IN THE  MORNING AND HALF A TABLET  BEFORE DINNER 135 tablet 2  . GLOBAL EASE INJECT PEN NEEDLES 32G X 4 MM MISC AS DIRECTED  100 each 2  . insulin glargine (LANTUS) 100 unit/mL SOPN Inject 0.2 mLs (20 Units total) into the skin daily at 8 pm. 15 mL 2  . metFORMIN (GLUCOPHAGE) 1000 MG tablet TAKE 1 TABLET BY MOUTH IN  THE MORNING, ONE-HALF  TABLET MIDDAY, AND 1 TABLET IN THE EVENING 225 tablet 1  . predniSONE (DELTASONE) 10 MG tablet Take 4 tablets for 3 days; Take 3 tablets for 4 days; Take 2 tablets for 3 days; Take 1 tablet for 4 days 34 tablet 0   No current facility-administered medications on file prior to visit.    Allergies  Allergen Reactions  . Amoxicillin-Pot Clavulanate Other (See Comments)    Severe stomach pain.   . Quinapril Hcl     REACTION: back pain  . Valsartan     REACTION: back pain  . Varenicline Tartrate     REACTION: AMS, hallucinations, night mares   Family History  Problem Relation Age of Onset  . Asthma Sister   . Emphysema Sister   . Diabetes Mother   . Diabetes Brother   . Heart disease Brother    PE: There were no vitals taken for this visit. There is no height or weight on file to calculate BMI. Wt Readings from Last 3 Encounters:  08/06/17 167 lb 8.8 oz (76 kg)  05/14/17 175 lb (79.4 kg)  01/27/17 179 lb (81.2 kg)   Constitutional: normal weight, in NAD, in wheelchair Eyes: PERRLA, EOMI, no exophthalmos ENT: moist mucous membranes, no thyromegaly, no cervical lymphadenopathy Cardiovascular: RRR, No MRG Respiratory: CTA B Gastrointestinal: abdomen soft, NT, ND,  BS+ Musculoskeletal: no deformities, strength intact in all 4 Skin: moist, warm, no rashes Neurological: no tremor with outstretched hands, DTR normal in all 4  ASSESSMENT: 1. DM2, insulin-dependent, uncontrolled, with complications - Cerebrovascular disease-h/o stroke  2. HL  PLAN:  1. Patient with long standing, uncontrolled DM, now with deterioration of control after his PNA episode and starting Prednisone. Sugars (checked 3x a day - per log) are almost all elevated. We discussed about starting Humalog before meals at least for the duration of his Prednisone tx >> at least 1 more week. However, we may need to continue this beyond the Prednisone course, depending on his sugars - I suggested to:  Patient Instructions  Please continue: - Glipizide XL 10 mg in am - Metformin 1000 mg 2x a day  - Lantus 20 units in am  Please add Humalog - 10-15 min before a meal: - 4 units before a smaller meal - 6 units before a larger meal  Please return in 1.5 months with your sugar log.   - today we'll check a fructosamine level (HbA1c today 11%, but this is not accurate for him) - continue checking sugars at different times of the day - check 1-2x a day, rotating checks - advised for yearly eye exams >> he is UTD - Return to clinic in 1.5 mo with sugar log    2. HL - Reviewed most recent lipid panels: LDL greatly decreased at last check; still high TG - continues on Lipitor >> no SEs - his weight loss may have helped the Lipids >> will need a new Lipid panel at next visit with me or PCP  Office Visit on 08/14/2017  Component Date Value Ref Range Status  . Hemoglobin A1C 08/14/2017 11   Final  . Fructosamine 08/14/2017 348* 190 - 270 umol/L Final   HbA1c calculated from the fructosamine is 7.5%  Salena Saner  Cruzita Lederer, MD PhD [facility] Endocrinology

## 2017-08-14 NOTE — Patient Instructions (Addendum)
Please continue: - Glipizide XL 10 mg in am - Metformin 1000 mg 2x a day  - Lantus 20 units in am  Please add Humalog - 10-15 min before a meal: - 4 units before a smaller meal - 6 units before a larger meal  Please return in 1.5 months with your sugar log.

## 2017-08-14 NOTE — Progress Notes (Signed)
Pre visit review using our clinic review tool, if applicable. No additional management support is needed unless otherwise documented below in the visit note. 

## 2017-08-17 LAB — FRUCTOSAMINE: Fructosamine: 348 umol/L — ABNORMAL HIGH (ref 190–270)

## 2017-09-08 ENCOUNTER — Other Ambulatory Visit: Payer: Self-pay | Admitting: Internal Medicine

## 2017-10-16 ENCOUNTER — Encounter: Payer: Self-pay | Admitting: Internal Medicine

## 2017-10-16 ENCOUNTER — Ambulatory Visit (INDEPENDENT_AMBULATORY_CARE_PROVIDER_SITE_OTHER): Payer: Medicare Other | Admitting: Internal Medicine

## 2017-10-16 VITALS — BP 110/70 | HR 100 | Ht 67.0 in | Wt 177.4 lb

## 2017-10-16 DIAGNOSIS — Z794 Long term (current) use of insulin: Secondary | ICD-10-CM

## 2017-10-16 DIAGNOSIS — E785 Hyperlipidemia, unspecified: Secondary | ICD-10-CM

## 2017-10-16 DIAGNOSIS — E1165 Type 2 diabetes mellitus with hyperglycemia: Secondary | ICD-10-CM | POA: Diagnosis not present

## 2017-10-16 NOTE — Progress Notes (Addendum)
Patient ID: Jonathan [name], male   DOB: 09/11/1941, 77 y.o.   MRN: [id number]  Jonathan [name] is a 77 y.o.-year-old male, returning for follow up management of DM2, dx 1990, uncontrolled, insulin-dep since ~6286, without complications (h/o stroke, also mild CKD). Last visit 2 mo ago.  He is here with his wife who offers part of the history, especially related to his past medical history and glucose values.  He had pneumonia in 07/2017 and was on prednisone.  His sugars were greatly increased at last visit so we had to start mealtime insulin.  Unfortunately, he did not use the Humalog consistently as he tells me that he did not know when to inject it.  We did discuss about this at length at last time and he also received written instructions.  His wife has stage 3 lung cancer >> ChTx. Patient is understandably stressed  Last hemoglobin A1c was: 08/14/2017: HbA1c calculated from the fructosamine is 7.5% 05/14/2017: HbA1c calculated from the fructosamine is 7.4% 01/27/2017: HbA1c calculated from the fructosamine is great: 6.2% 10/30/2016: HbA1c calculated from the fructosamine is great, at 6.5% 07/31/2016: HbA1c calculated from the fructosamine is great, at 6.15% Lab Results  Component Value Date   HGBA1C 11 08/14/2017   HGBA1C 7.9 07/31/2016   HGBA1C 7.9 04/29/2016   He is on: - Glipizide XL 10 mg in am - Metformin 1000 mg 2x a day  - Lantus 20 units in am - Humalog 4-6 units before meals - added 07/2017 due to increased blood sugars while on steroids - 113-281  Checks sugars 3x a day: - am:   87-167 >> 129-268 >> 143-197, 205, 247, 271 (forgets insulin) - 2h after b'fast: n/c  - lunch: 92-252, 340 >> 157-296 >> 113-209 - 2h after lunch: n/c - dinner:  81-128 >> 88-133, 158 (late lunch) >> n/c >> 123-220, 270 - 2h after dinner: 139-280, 373 >> 173-284 >> n/c - bedtime: n/c Highest: 178 >> 288 >> 373 >> 296 >> 273 Lowest 75 >> 89 >> 87 >> 129 >> 113  + Stage II-III 3 CKD, last  BUN/creatinine: Lab Results  Component Value Date   BUN 17 08/05/2017   CREATININE 1.11 08/05/2017   Lab Results  Component Value Date   GFR 74.54 11/25/2016   MICRALBCREAT 7.6 09/15/2016  He is allergic to ACE inhibitors and ARB.  Last ACR: Lab Results  Component Value Date   MICRALBCREAT 7.6 09/15/2016   MICRALBCREAT 12.7 04/28/2014   MICRALBCREAT 25.3 12/17/2009   MICRALBCREAT 22.6 02/13/2009   -+ HL: Lab Results  Component Value Date   CHOL 181 11/25/2016   HDL 41.70 11/25/2016   LDLCALC 58 09/01/2014   LDLDIRECT 106.0 11/25/2016   TRIG 276.0 (H) 11/25/2016   CHOLHDL 4 11/25/2016  On Lipitor On ASA 81 - Last eye exam was in 78:  No DR - No numbness and tingling in feet.  Still smokes 1 PPD.  He is too short of breath for any type of activity, he tells me.  * Around early 2015, he was hospitalized after syncope, was in ICU, was on assisted ventilation, expressive aphasia recovered- believed to have a stroke, now better.    He is unstable on his feet because of back pain.  ROS: Constitutional: + Weight gain/no weight loss, + fatigue, no subjective hyperthermia, no subjective hypothermia, + nocturia Eyes: no blurry vision, no xerophthalmia ENT: no sore throat, no nodules palpated in throat, no dysphagia, no odynophagia, no hoarseness Cardiovascular: no CP/+  SOB/no palpitations/no leg swelling Respiratory: + Cough/ + SOB/+ wheezing Gastrointestinal: no N/no V/no D/no C/no acid reflux Musculoskeletal: + Muscle aches/+ joint aches Skin: no rashes, no hair loss Neurological: no tremors/no numbness/no tingling/no dizziness, + headache  I reviewed pt's medications, allergies, PMH, social hx, family hx, and changes were documented in the history of present illness. Otherwise, unchanged from my initial visit note.  Past Medical History:  Diagnosis Date  . Allergic rhinitis   . Asthma   . COPD (chronic obstructive pulmonary disease) ([place])   . Dermatitis seborrheica    . Diabetes mellitus type II   . ED (erectile dysfunction)   . Frozen shoulder   . HLD (hyperlipidemia)   . HTN (hypertension)   . Hyperkalemia   . Kidney stone   . Other diseases of lung, not elsewhere classified   . Pulmonary nodule, right    lower lobe  . Rotator cuff injury    right  . Shoulder pain   . Stroke [facility])    pt states "last year"  . Tobacco abuse    Past Surgical History:  Procedure Laterality Date  . CERVICAL DISCECTOMY  1998  . EEG    . ETT  11/1994  . TEE WITHOUT CARDIOVERSION  12/03/2011   Procedure: TRANSESOPHAGEAL ECHOCARDIOGRAM (TEE);  Surgeon: Lelon Perla, MD;  Location: [facility] ENDOSCOPY;  Service: Cardiovascular;  Laterality: N/A;  . TM repair     Social History   Socioeconomic History  . Marital status: Married    Spouse name: Not on file  . Number of children: Not on file  . Years of education: Not on file  . Highest education level: Not on file  Social Needs  . Financial resource strain: Not on file  . Food insecurity - worry: Not on file  . Food insecurity - inability: Not on file  . Transportation needs - medical: Not on file  . Transportation needs - non-medical: Not on file  Occupational History  . Occupation: Retired    Comment: Air cabin crew  Tobacco Use  . Smoking status: Current Every Day Smoker    Packs/day: 1.00    Types: Cigarettes  . Smokeless tobacco: Never Used  Substance and Sexual Activity  . Alcohol use: No    Alcohol/week: 0.0 oz    Comment: Quit 40 years ago  . Drug use: No  . Sexual activity: No    Partners: Female  Other Topics Concern  . Not on file  Social History Narrative  . Not on file   Current Outpatient Medications on File Prior to Visit  Medication Sig Dispense Refill  . ACCU-CHEK AVIVA PLUS test strip USE TO CHECK BLOOD GLUCOSE LEVELS 3 TO 4TIMES DAILY 200 each 2  . albuterol (PROVENTIL) (2.5 MG/3ML) 0.083% nebulizer solution Take 3 mLs (2.5 mg total) every 4 (four) hours as needed by  nebulization for shortness of breath. 75 mL 0  . albuterol (VENTOLIN HFA) 108 (90 Base) MCG/ACT inhaler Inhale 2 puffs every 6 (six) hours as needed into the lungs for wheezing. 1 Inhaler 0  . amLODipine (NORVASC) 5 MG tablet TAKE 1 TABLET BY MOUTH  DAILY 90 tablet 2  . aspirin 81 MG tablet Take 1 tablet (81 mg total) by mouth daily. 30 tablet 0  . atorvastatin (LIPITOR) 10 MG tablet Take 1 tablet (10 mg total) by mouth daily. 90 tablet 1  . Blood Glucose Monitoring Suppl (ACCU-CHEK AVIVA PLUS) W/DEVICE KIT USE AS DIRECTED 1 kit 0  . dextromethorphan-guaiFENesin (  MUCINEX DM) 30-600 MG 12hr tablet Take 1 tablet 2 (two) times daily by mouth. 30 tablet 0  . fluticasone furoate-vilanterol (BREO ELLIPTA) 100-25 MCG/INH AEPB Inhale 1 puff daily into the lungs. 60 each 0  . glipiZIDE (GLUCOTROL XL) 10 MG 24 hr tablet TAKE 1 TABLET IN THE  MORNING AND HALF A TABLET  BEFORE DINNER 135 tablet 2  . GLOBAL EASE INJECT PEN NEEDLES 32G X 4 MM MISC AS DIRECTED 100 each 2  . insulin glargine (LANTUS) 100 unit/mL SOPN Inject 0.2 mLs (20 Units total) into the skin daily at 8 pm. 15 mL 2  . insulin lispro (HUMALOG) 100 UNIT/ML KiwkPen Inject 0.04-0.06 mLs (4-6 Units total) 3 (three) times daily into the skin. 15 mL 11  . metFORMIN (GLUCOPHAGE) 1000 MG tablet Take 1,037m by mouth twice a day 180 tablet 1  . predniSONE (DELTASONE) 10 MG tablet Take 4 tablets for 3 days; Take 3 tablets for 4 days; Take 2 tablets for 3 days; Take 1 tablet for 4 days 34 tablet 0   No current facility-administered medications on file prior to visit.    Allergies  Allergen Reactions  . Amoxicillin-Pot Clavulanate Other (See Comments)    Severe stomach pain.   . Quinapril Hcl     REACTION: back pain  . Valsartan     REACTION: back pain  . Varenicline Tartrate     REACTION: AMS, hallucinations, night mares   Family History  Problem Relation Age of Onset  . Asthma Sister   . Emphysema Sister   . Diabetes Mother   . Diabetes  Brother   . Heart disease Brother    PE: BP 110/70   Pulse 100   Ht 5' 7"  (1.702 m)   Wt 177 lb 6.4 oz (80.5 kg)   SpO2 92%   BMI 27.78 kg/m  Body mass index is 27.78 kg/m. Wt Readings from Last 3 Encounters:  10/16/17 177 lb 6.4 oz (80.5 kg)  08/14/17 172 lb (78 kg)  08/06/17 167 lb 8.8 oz (76 kg)   Constitutional: normal weight, in NAD, in wheelchair Eyes: PERRLA, EOMI, no exophthalmos ENT: moist mucous membranes, no thyromegaly, no cervical lymphadenopathy Cardiovascular: tachycardia, RR, No MRG Respiratory: CTA B Gastrointestinal: abdomen soft, NT, ND, BS+ Musculoskeletal: no deformities, strength intact in all 4 Skin: moist, warm, no rashes Neurological: no tremor with outstretched hands, DTR normal in all 4  ASSESSMENT: 1. DM2, insulin-dependent, uncontrolled, with complications - Cerebrovascular disease-h/o stroke  2. HL  PLAN:  1. Patient with long-standing, uncontrolled, diabetes, on basal insulin, metformin, and glipizide, to which I advised him to to add mealtime insulin at last visit, as his sugars were very high 2/2 his pneumonia episode in starting prednisone. Unfortunately, he did not start this consistently (only used it a few times since last visit), as he did not know when to use it.  At last visit, I did explain to him and his wife that this was to be taken before meals, and he also received written instructions.  I strongly advised him to always reviewed the instructions and if he has any questions to call me to clarify. - Sugars are slightly better, but they are still above target.  I again advised him to start the Humalog before meals. - I suggested to:  Patient Instructions  Please continue: - Glipizide XL 10 mg in am - Metformin 1000 mg 2x a day  - Lantus 20 units in am  Add: - Humalog 4-6 units  15 min before each of the meals  Please stop at the lab.  Please return in 3 months with your sugar log.   - today, will check his fructosamine -  continue checking sugars at different times of the day - check 3x a day, rotating checks - advised for yearly eye exams >> he is UTD - Return to clinic in 3 mo with sugar log    2. HL - Reviewed most recent lipid panel from 10/2016: LDL greatly decreased at last check; still high triglycerides - Continues on Lipitor, without side effects - He is due for another lipid level soon  Office Visit on 10/16/2017  Component Date Value Ref Range Status  . Fructosamine 10/16/2017 328* 190 - 270 umol/L Final   HbA1c calculated from the fructosamine is better, at 7.18%.  Philemon Kingdom, MD PhD [facility] Endocrinology

## 2017-10-16 NOTE — Patient Instructions (Addendum)
Please continue: - Glipizide XL 10 mg in am - Metformin 1000 mg 2x a day  - Lantus 20 units in am  Add: - Humalog 4-6 units 15 min before each of the meals  Please stop at the lab.  Please return in 3 months with your sugar log.

## 2017-10-19 LAB — FRUCTOSAMINE: Fructosamine: 328 umol/L — ABNORMAL HIGH (ref 190–270)

## 2017-11-06 ENCOUNTER — Other Ambulatory Visit: Payer: Self-pay | Admitting: Family Medicine

## 2017-11-06 NOTE — Telephone Encounter (Signed)
I refilled times one  Please schedule f/u when able

## 2017-11-06 NOTE — Telephone Encounter (Signed)
No recent or future appts., please advise  

## 2017-11-09 NOTE — Telephone Encounter (Signed)
Pt advised and voiced understanding.  He stated that he will call back to schedule an appointment.

## 2017-12-09 ENCOUNTER — Other Ambulatory Visit: Payer: Self-pay

## 2017-12-09 ENCOUNTER — Emergency Department (HOSPITAL_COMMUNITY): Payer: Medicare Other

## 2017-12-09 ENCOUNTER — Inpatient Hospital Stay (HOSPITAL_COMMUNITY)
Admission: EM | Admit: 2017-12-09 | Discharge: 2017-12-12 | DRG: 190 | Disposition: A | Discharge status: Home / Self Care | Payer: Medicare Other | Attending: Internal Medicine | Admitting: Internal Medicine

## 2017-12-09 ENCOUNTER — Encounter (HOSPITAL_COMMUNITY): Payer: Self-pay

## 2017-12-09 DIAGNOSIS — Z888 Allergy status to other drugs, medicaments and biological substances status: Secondary | ICD-10-CM

## 2017-12-09 DIAGNOSIS — Z825 Family history of asthma and other chronic lower respiratory diseases: Secondary | ICD-10-CM

## 2017-12-09 DIAGNOSIS — E785 Hyperlipidemia, unspecified: Secondary | ICD-10-CM | POA: Diagnosis present

## 2017-12-09 DIAGNOSIS — Z794 Long term (current) use of insulin: Secondary | ICD-10-CM | POA: Diagnosis not present

## 2017-12-09 DIAGNOSIS — J441 Chronic obstructive pulmonary disease with (acute) exacerbation: Secondary | ICD-10-CM | POA: Diagnosis present

## 2017-12-09 DIAGNOSIS — Z7951 Long term (current) use of inhaled steroids: Secondary | ICD-10-CM | POA: Diagnosis not present

## 2017-12-09 DIAGNOSIS — Z880 Allergy status to penicillin: Secondary | ICD-10-CM

## 2017-12-09 DIAGNOSIS — Z8673 Personal history of transient ischemic attack (TIA), and cerebral infarction without residual deficits: Secondary | ICD-10-CM | POA: Diagnosis not present

## 2017-12-09 DIAGNOSIS — E1165 Type 2 diabetes mellitus with hyperglycemia: Secondary | ICD-10-CM

## 2017-12-09 DIAGNOSIS — N179 Acute kidney failure, unspecified: Secondary | ICD-10-CM | POA: Diagnosis not present

## 2017-12-09 DIAGNOSIS — Z833 Family history of diabetes mellitus: Secondary | ICD-10-CM | POA: Diagnosis not present

## 2017-12-09 DIAGNOSIS — Z7982 Long term (current) use of aspirin: Secondary | ICD-10-CM

## 2017-12-09 DIAGNOSIS — F17210 Nicotine dependence, cigarettes, uncomplicated: Secondary | ICD-10-CM | POA: Diagnosis present

## 2017-12-09 DIAGNOSIS — Z8249 Family history of ischemic heart disease and other diseases of the circulatory system: Secondary | ICD-10-CM

## 2017-12-09 DIAGNOSIS — J9621 Acute and chronic respiratory failure with hypoxia: Secondary | ICD-10-CM

## 2017-12-09 DIAGNOSIS — Z9981 Dependence on supplemental oxygen: Secondary | ICD-10-CM | POA: Diagnosis not present

## 2017-12-09 DIAGNOSIS — I10 Essential (primary) hypertension: Secondary | ICD-10-CM | POA: Diagnosis present

## 2017-12-09 DIAGNOSIS — E861 Hypovolemia: Secondary | ICD-10-CM | POA: Diagnosis present

## 2017-12-09 DIAGNOSIS — E1169 Type 2 diabetes mellitus with other specified complication: Secondary | ICD-10-CM | POA: Diagnosis present

## 2017-12-09 LAB — CBC WITH DIFFERENTIAL/PLATELET
Basophils Absolute: 0 10*3/uL (ref 0.0–0.1)
Basophils Relative: 0 %
Eosinophils Absolute: 0.1 10*3/uL (ref 0.0–0.7)
Eosinophils Relative: 2 %
HCT: 47 % (ref 39.0–52.0)
Hemoglobin: 15.5 g/dL (ref 13.0–17.0)
Lymphocytes Relative: 26 %
Lymphs Abs: 2.1 10*3/uL (ref 0.7–4.0)
MCH: 29 pg (ref 26.0–34.0)
MCHC: 33 g/dL (ref 30.0–36.0)
MCV: 87.9 fL (ref 78.0–100.0)
Monocytes Absolute: 0.2 10*3/uL (ref 0.1–1.0)
Monocytes Relative: 3 %
Neutro Abs: 5.4 10*3/uL (ref 1.7–7.7)
Neutrophils Relative %: 69 %
Platelets: 225 10*3/uL (ref 150–400)
RBC: 5.35 MIL/uL (ref 4.22–5.81)
RDW: 13.9 % (ref 11.5–15.5)
WBC: 7.8 10*3/uL (ref 4.0–10.5)

## 2017-12-09 LAB — BASIC METABOLIC PANEL
Anion gap: 9 (ref 5–15)
BUN: 11 mg/dL (ref 6–20)
CO2: 27 mmol/L (ref 22–32)
Calcium: 7.8 mg/dL — ABNORMAL LOW (ref 8.9–10.3)
Chloride: 102 mmol/L (ref 101–111)
Creatinine, Ser: 0.84 mg/dL (ref 0.61–1.24)
GFR calc Af Amer: >60 mL/min (ref >60)
GFR calc non Af Amer: >60 mL/min (ref >60)
Glucose, Bld: 253 mg/dL — ABNORMAL HIGH (ref 65–99)
Potassium: 4.4 mmol/L (ref 3.5–5.1)
Sodium: 138 mmol/L (ref 135–145)

## 2017-12-09 MED ORDER — ACETAMINOPHEN 650 MG RE SUPP: 650.0000 mg | Freq: Four times a day (QID) | RECTAL | Status: DC | PRN

## 2017-12-09 MED ORDER — INSULIN GLARGINE 100 UNIT/ML ~~LOC~~ SOLN
20.0000 [IU] | Freq: Every day | SUBCUTANEOUS | Status: DC
  Administered 2017-12-09 – 2017-12-10 (×2): 20 [IU] via SUBCUTANEOUS
  Filled 2017-12-09 (×2): qty 0.2

## 2017-12-09 MED ORDER — INSULIN ASPART 100 UNIT/ML ~~LOC~~ SOLN: 0.0000 [IU] | Freq: Three times a day (TID) | SUBCUTANEOUS | Status: DC

## 2017-12-09 MED ORDER — AMLODIPINE BESYLATE 5 MG PO TABS
5.0000 mg | ORAL_TABLET | Freq: Every day | ORAL | Status: DC
  Administered 2017-12-10: 5 mg via ORAL
  Filled 2017-12-09: qty 1

## 2017-12-09 MED ORDER — ALBUTEROL SULFATE (2.5 MG/3ML) 0.083% IN NEBU: 2.5000 mg | INHALATION_SOLUTION | RESPIRATORY_TRACT | Status: DC

## 2017-12-09 MED ORDER — ENOXAPARIN SODIUM 40 MG/0.4ML ~~LOC~~ SOLN
40.0000 mg | Freq: Every day | SUBCUTANEOUS | Status: DC
  Administered 2017-12-10 – 2017-12-11 (×3): 40 mg via SUBCUTANEOUS
  Filled 2017-12-09 (×3): qty 0.4

## 2017-12-09 MED ORDER — IPRATROPIUM-ALBUTEROL 0.5-2.5 (3) MG/3ML IN SOLN
3.0000 mL | RESPIRATORY_TRACT | Status: DC
  Administered 2017-12-10 – 2017-12-11 (×12): 3 mL via RESPIRATORY_TRACT
  Filled 2017-12-09 (×11): qty 3

## 2017-12-09 MED ORDER — ALBUTEROL SULFATE (2.5 MG/3ML) 0.083% IN NEBU: 2.5000 mg | INHALATION_SOLUTION | RESPIRATORY_TRACT | Status: DC | PRN

## 2017-12-09 MED ORDER — BUDESONIDE 0.25 MG/2ML IN SUSP
0.2500 mg | Freq: Two times a day (BID) | RESPIRATORY_TRACT | Status: DC
  Administered 2017-12-10 – 2017-12-12 (×6): 0.25 mg via RESPIRATORY_TRACT
  Filled 2017-12-09 (×6): qty 2

## 2017-12-09 MED ORDER — IPRATROPIUM-ALBUTEROL 0.5-2.5 (3) MG/3ML IN SOLN
RESPIRATORY_TRACT | Status: AC
  Administered 2017-12-10: 3 mL via RESPIRATORY_TRACT
  Filled 2017-12-09: qty 3

## 2017-12-09 MED ORDER — LEVOFLOXACIN IN D5W 500 MG/100ML IV SOLN: 500.0000 mg | INTRAVENOUS | Status: DC

## 2017-12-09 MED ORDER — SODIUM CHLORIDE 0.9 % IV SOLN
INTRAVENOUS | Status: DC
  Administered 2017-12-09: 18:00:00 via INTRAVENOUS

## 2017-12-09 MED ORDER — LEVOFLOXACIN IN D5W 500 MG/100ML IV SOLN
500.0000 mg | Freq: Once | INTRAVENOUS | Status: AC
  Administered 2017-12-10: 500 mg via INTRAVENOUS
  Filled 2017-12-09: qty 100

## 2017-12-09 MED ORDER — DM-GUAIFENESIN ER 30-600 MG PO TB12
1.0000 | ORAL_TABLET | Freq: Two times a day (BID) | ORAL | Status: DC
  Administered 2017-12-10 – 2017-12-12 (×6): 1 via ORAL
  Filled 2017-12-09 (×6): qty 1

## 2017-12-09 MED ORDER — ONDANSETRON HCL 4 MG PO TABS
4.0000 mg | ORAL_TABLET | Freq: Four times a day (QID) | ORAL | Status: DC | PRN
Start: 2017-12-09 — End: 2017-12-12

## 2017-12-09 MED ORDER — IPRATROPIUM BROMIDE 0.02 % IN SOLN: 0.5000 mg | RESPIRATORY_TRACT | Status: DC

## 2017-12-09 MED ORDER — ALBUTEROL (5 MG/ML) CONTINUOUS INHALATION SOLN
10.0000 mg/h | INHALATION_SOLUTION | Freq: Once | RESPIRATORY_TRACT | Status: AC
  Administered 2017-12-09: 10 mg/h via RESPIRATORY_TRACT
  Filled 2017-12-09: qty 20

## 2017-12-09 MED ORDER — MAGNESIUM SULFATE 2 GM/50ML IV SOLN
2.0000 g | Freq: Once | INTRAVENOUS | Status: AC
  Administered 2017-12-09: 2 g via INTRAVENOUS
  Filled 2017-12-09: qty 50

## 2017-12-09 MED ORDER — ASPIRIN EC 81 MG PO TBEC
81.0000 mg | DELAYED_RELEASE_TABLET | Freq: Every day | ORAL | Status: DC
  Administered 2017-12-10 – 2017-12-12 (×3): 81 mg via ORAL
  Filled 2017-12-09 (×3): qty 1

## 2017-12-09 MED ORDER — ATORVASTATIN CALCIUM 10 MG PO TABS
10.0000 mg | ORAL_TABLET | Freq: Every day | ORAL | Status: DC
  Administered 2017-12-10 – 2017-12-12 (×3): 10 mg via ORAL
  Filled 2017-12-09 (×3): qty 1

## 2017-12-09 MED ORDER — ACETAMINOPHEN 325 MG PO TABS
650.0000 mg | ORAL_TABLET | Freq: Four times a day (QID) | ORAL | Status: DC | PRN
  Administered 2017-12-10: 650 mg via ORAL
  Filled 2017-12-09: qty 2

## 2017-12-09 MED ORDER — ONDANSETRON HCL 4 MG/2ML IJ SOLN: 4.0000 mg | Freq: Four times a day (QID) | INTRAMUSCULAR | Status: DC | PRN

## 2017-12-09 NOTE — ED Notes (Signed)
ED TO INPATIENT HANDOFF REPORT  Name/Age/Gender Jonathan [name] 77 y.o. male  Code Status Code Status History    Date Active Date Inactive Code Status Order ID Comments User Context   08/04/2017 21:35 08/06/2017 19:40 Full Code [id number]  Vianne Bulls, MD ED   12/14/2013 21:48 12/19/2013 22:00 Full Code [id number]  Theressa Millard, MD Inpatient   11/28/2013 08:06 12/08/2013 18:13 Full Code [id number]  Jonathan Patience, MD Inpatient   11/28/2013 06:47 11/28/2013 08:06 Full Code [id number]  Rhodia Albright, NP ED   11/23/2013 16:05 11/26/2013 19:51 Full Code [id number]  Domenic Polite, MD ED      Home/SNF/Other Home  Chief Complaint shortness of breath  Level of Care/Admitting Diagnosis ED Disposition    ED Disposition Condition [facility] Area: [facility] [[id number]]  Level of Care: Telemetry [5]  Admit to tele based on following criteria: Monitor for Ischemic changes  Diagnosis: COPD (chronic obstructive pulmonary disease) [facility]) [[id number]]  Admitting Physician: Jonathan Patience [phone]  Attending Physician: Jonathan Patience [phone]  Estimated length of stay: past midnight tomorrow  Certification:: I certify this patient will need inpatient services for at least 2 midnights  PT Class (Do Not Modify): Inpatient [101]  PT Acc Code (Do Not Modify): Private [1]       Medical History Past Medical History:  Diagnosis Date  . Allergic rhinitis   . Asthma   . COPD (chronic obstructive pulmonary disease) ([place])   . Dermatitis seborrheica   . Diabetes mellitus type II   . ED (erectile dysfunction)   . Frozen shoulder   . HLD (hyperlipidemia)   . HTN (hypertension)   . Hyperkalemia   . Kidney stone   . Other diseases of lung, not elsewhere classified   . Pulmonary nodule, right    lower lobe  . Rotator cuff injury    right  . Shoulder pain   . Stroke [facility])    pt states "last year"  . Tobacco abuse     Allergies Allergies  Allergen  Reactions  . Amoxicillin-Pot Clavulanate Other (See Comments)    Severe stomach pain.   . Quinapril Hcl     REACTION: back pain  . Valsartan     REACTION: back pain  . Varenicline Tartrate     REACTION: AMS, hallucinations, night mares    IV Location/Drains/Wounds Patient Lines/Drains/Airways Status   Active Line/Drains/Airways    Name:   Placement date:   Placement time:   Site:   Days:   Peripheral IV 12/09/17 Left Forearm   12/09/17    1735    Forearm   less than 1   Wound / Incision (Open or Dehisced) Other (Comment) Buttocks Left abrasion   -    -    Buttocks             Labs/Imaging Results for orders placed or performed during the hospital encounter of 12/09/17 (from the past 48 hour(s))  Basic metabolic panel     Status: Abnormal   Collection Time: 12/09/17  6:22 PM  Result Value Ref Range   Sodium 138 135 - 145 mmol/L   Potassium 4.4 3.5 - 5.1 mmol/L   Chloride 102 101 - 111 mmol/L   CO2 27 22 - 32 mmol/L   Glucose, Bld 253 (H) 65 - 99 mg/dL   BUN 11 6 - 20 mg/dL   Creatinine, Ser 0.84 0.61 - 1.24 mg/dL   Calcium 7.8 (  L) 8.9 - 10.3 mg/dL   GFR calc non Af Amer >60 >60 mL/min   GFR calc Af Amer >60 >60 mL/min    Comment: (NOTE) The eGFR has been calculated using the CKD EPI equation. This calculation has not been validated in all clinical situations. eGFR's persistently <60 mL/min signify possible Chronic Kidney Disease.    Anion gap 9 5 - 15    Comment: Performed at [facility], [place] [address] [place], [place] [address]  CBC with Differential/Platelet     Status: None   Collection Time: 12/09/17  6:22 PM  Result Value Ref Range   WBC 7.8 4.0 - 10.5 K/uL   RBC 5.35 4.22 - 5.81 MIL/uL   Hemoglobin 15.5 13.0 - 17.0 g/dL   HCT 47.0 39.0 - 52.0 %   MCV 87.9 78.0 - 100.0 fL   MCH 29.0 26.0 - 34.0 pg   MCHC 33.0 30.0 - 36.0 g/dL   RDW 13.9 11.5 - 15.5 %   Platelets 225 150 - 400 K/uL   Neutrophils Relative % 69 %   Neutro Abs 5.4 1.7 -  7.7 K/uL   Lymphocytes Relative 26 %   Lymphs Abs 2.1 0.7 - 4.0 K/uL   Monocytes Relative 3 %   Monocytes Absolute 0.2 0.1 - 1.0 K/uL   Eosinophils Relative 2 %   Eosinophils Absolute 0.1 0.0 - 0.7 K/uL   Basophils Relative 0 %   Basophils Absolute 0.0 0.0 - 0.1 K/uL    Comment: Performed at [facility], [place] [address] [place], [place] [address]   Dg Chest 2 View  Result Date: 12/09/2017 CLINICAL DATA:  77 year old male with shortness of breath. EXAM: CHEST - 2 VIEW COMPARISON:  Chest radiograph dated 08/04/2017 FINDINGS: There is emphysematous changes of the lungs. Areas of streaky density primarily in the right infrahilar and right lung base may be related to chronic interstitial coarsening and atelectatic changes. Developing infiltrate is not excluded. Clinical correlation is recommended. No focal consolidation, pleural effusion, or pneumothorax. Stable mild cardiomegaly. There is atherosclerotic calcification of the aortic arch. No acute osseous pathology. IMPRESSION: 1. Right lung base interstitial coarsening and atelectatic changes versus developing infiltrate. No focal consolidation. 2. Emphysema. 3. Mild cardiomegaly. Electronically Signed   By: Anner Crete M.D.   On: 12/09/2017 19:25    Pending Labs Unresulted Labs (From admission, onward)   None      Vitals/Pain Today's Vitals   12/09/17 1801 12/09/17 1942 12/09/17 2028 12/09/17 2152  BP: (!) 110/93 (!) 139/98  95/76  Pulse: 100 99  (!) 107  Resp:  16  20  Temp: 98 F (36.7 C)     TempSrc: Oral     SpO2: 99% 98% 98% 97%  PainSc:        Isolation Precautions No active isolations  Medications Medications  0.9 %  sodium chloride infusion ( Intravenous New Bag/Given 12/09/17 1821)  magnesium sulfate IVPB 2 g 50 mL (not administered)  levofloxacin (LEVAQUIN) IVPB 500 mg (not administered)  amLODipine (NORVASC) tablet 5 mg (not administered)  insulin glargine (LANTUS) injection 20 Units (not  administered)  albuterol (PROVENTIL,VENTOLIN) solution continuous neb (10 mg/hr Nebulization Given 12/09/17 2028)    Mobility walks with device

## 2017-12-09 NOTE — Progress Notes (Signed)
Pharmacy Antibiotic Note  Jonathan [name] is a 77 y.o. male admitted on 12/09/2017 with Bronchitis.  Pharmacy has been consulted for Levofloxacin dosing.  Plan: Levofloxacin 500mg  iv q24hr     Temp (24hrs), Avg:98 F (36.7 C), Min:98 F (36.7 C), Max:98 F (36.7 C)  Recent Labs  Lab 12/09/17 1822  WBC 7.8  CREATININE 0.84    CrCl cannot be calculated (Unknown ideal weight.).    Allergies  Allergen Reactions  . Amoxicillin-Pot Clavulanate Other (See Comments)    Severe stomach pain.   . Quinapril Hcl     REACTION: back pain  . Valsartan     REACTION: back pain  . Varenicline Tartrate     REACTION: AMS, hallucinations, night mares    Antimicrobials this admission: Levofloxacin 12/09/2017 >>  Dose adjustments this admission: -  Microbiology results: -  Thank you for allowing pharmacy to be a part of this patient's care.  Nani Skillern Crowford 12/09/2017 11:51 PM

## 2017-12-09 NOTE — ED Provider Notes (Signed)
[facility] DEPT Provider Note   CSN: [id number] Arrival date & time: 12/09/17  1739     History   Chief Complaint Chief Complaint  Patient presents with  . Shortness of Breath    HPI Jonathan [name] is a 77 y.o. male.  HPI Patient presented to the emergency room for evaluation of shortness of breath.  Patient has a history of COPD.  He does continue to smoke.  He is on oxygen at home.  Patient states yesterday evening he felt like his symptoms started to escalate.  He started to cough more.  He was wheezing more.  He was feeling more short of breath.  Today when he went to walk to the bathroom he had stop and rest because he became too short of breath.  He called EMS.  He was given 10 mg of albuterol, 1 mg Atrovent, and 125 mg of Solu-Medrol.  Patient feels like his symptoms are improving.  He denies any chest pain.  No fevers.  No leg swelling.  No other complaints. Past Medical History:  Diagnosis Date  . Allergic rhinitis   . Asthma   . COPD (chronic obstructive pulmonary disease) ([place])   . Dermatitis seborrheica   . Diabetes mellitus type II   . ED (erectile dysfunction)   . Frozen shoulder   . HLD (hyperlipidemia)   . HTN (hypertension)   . Hyperkalemia   . Kidney stone   . Other diseases of lung, not elsewhere classified   . Pulmonary nodule, right    lower lobe  . Rotator cuff injury    right  . Shoulder pain   . Stroke [facility])    pt states "last year"  . Tobacco abuse       Past Surgical History:  Procedure Laterality Date  . CERVICAL DISCECTOMY  1998  . EEG    . ETT  11/1994  . TEE WITHOUT CARDIOVERSION  12/03/2011   Procedure: TRANSESOPHAGEAL ECHOCARDIOGRAM (TEE);  Surgeon: Lelon Perla, MD;  Location[facility] ENDOSCOPY;  Service: Cardiovascular;  Laterality: N/A;  . TM repair         Home Medications    Prior to Admission medications   Medication Sig Start Date End Date Taking? Authorizing Provider  ACCU-CHEK AVIVA  PLUS test strip USE TO CHECK BLOOD GLUCOSE LEVELS 3 TO 4TIMES DAILY 08/03/17  Yes Philemon Kingdom, MD  albuterol (VENTOLIN HFA) 108 (90 Base) MCG/ACT inhaler Inhale 2 puffs every 6 (six) hours as needed into the lungs for wheezing. 08/06/17  Yes Patrecia Pour, Christean Grief, MD  amLODipine (NORVASC) 5 MG tablet TAKE 1 TABLET BY MOUTH  DAILY 11/06/17  Yes Tower, Wynelle Fanny, MD  aspirin 81 MG tablet Take 1 tablet (81 mg total) by mouth daily. 11/26/13  Yes Ghimire, Henreitta Leber, MD  atorvastatin (LIPITOR) 10 MG tablet Take 1 tablet (10 mg total) by mouth daily. 07/14/17  Yes Tower, Wynelle Fanny, MD  Blood Glucose Monitoring Suppl (ACCU-CHEK AVIVA PLUS) W/DEVICE KIT USE AS DIRECTED 12/13/14  Yes Tower, Wynelle Fanny, MD  dextromethorphan-guaiFENesin (MUCINEX DM) 30-600 MG 12hr tablet Take 1 tablet 2 (two) times daily by mouth. 08/06/17  Yes Patrecia Pour, Christean Grief, MD  fluticasone furoate-vilanterol (BREO ELLIPTA) 100-25 MCG/INH AEPB Inhale 1 puff daily into the lungs. 08/06/17  Yes Patrecia Pour, Christean Grief, MD  glipiZIDE (GLUCOTROL XL) 10 MG 24 hr tablet TAKE 1 TABLET IN THE  MORNING AND HALF A TABLET  BEFORE DINNER Patient taking differently: TAKE 1 TABLET IN  THE  MORNING 04/29/17  Yes Philemon Kingdom, MD  GLOBAL EASE INJECT PEN NEEDLES 32G X 4 MM MISC AS DIRECTED 06/03/16  Yes Philemon Kingdom, MD  insulin glargine (LANTUS) 100 unit/mL SOPN Inject 0.2 mLs (20 Units total) into the skin daily at 8 pm. 10/30/16  Yes Philemon Kingdom, MD  insulin lispro (HUMALOG) 100 UNIT/ML KiwkPen Inject 0.04-0.06 mLs (4-6 Units total) 3 (three) times daily into the skin. 08/14/17  Yes Philemon Kingdom, MD  metFORMIN (GLUCOPHAGE) 1000 MG tablet Take 1,083m by mouth twice a day 09/09/17  Yes GPhilemon Kingdom MD  albuterol (PROVENTIL) (2.5 MG/3ML) 0.083% nebulizer solution Take 3 mLs (2.5 mg total) every 4 (four) hours as needed by nebulization for shortness of breath. Patient not taking: Reported on 12/09/2017 08/06/17   SDoreatha Lew MD    Family  History Family History  Problem Relation Age of Onset  . Asthma Sister   . Emphysema Sister   . Diabetes Mother   . Diabetes Brother   . Heart disease Brother     Social History Social History   Tobacco Use  . Smoking status: Current Every Day Smoker    Packs/day: 1.00    Types: Cigarettes  . Smokeless tobacco: Never Used  Substance Use Topics  . Alcohol use: No    Alcohol/week: 0.0 oz    Comment: Quit 40 years ago  . Drug use: No     Allergies   Amoxicillin-pot clavulanate; Quinapril hcl; Valsartan; and Varenicline tartrate   Review of Systems Review of Systems  All other systems reviewed and are negative.    Physical Exam Updated Vital Signs BP 95/76   Pulse (!) 107   Temp 98 F (36.7 C) (Oral)   Resp 20   SpO2 97%   Physical Exam  Constitutional:  Non-toxic appearance. No distress.  HENT:  Head: Normocephalic and atraumatic.  Right Ear: External ear normal.  Left Ear: External ear normal.  Eyes: Conjunctivae are normal. Right eye exhibits no discharge. Left eye exhibits no discharge. No scleral icterus.  Neck: Neck supple. No tracheal deviation present.  Cardiovascular: Normal rate, regular rhythm and intact distal pulses.  Pulmonary/Chest: Effort normal. No accessory muscle usage or stridor. No respiratory distress. He has decreased breath sounds. He has wheezes. He has no rales.  Abdominal: Soft. Bowel sounds are normal. He exhibits no distension. There is no tenderness. There is no rebound and no guarding.  Musculoskeletal: He exhibits no edema or tenderness.  Neurological: He is alert. He has normal strength. No cranial nerve deficit (no facial droop, extraocular movements intact, no slurred speech) or sensory deficit. He exhibits normal muscle tone. He displays no seizure activity. Coordination normal.  Skin: Skin is warm and dry. No rash noted.  Psychiatric: He has a normal mood and affect.  Nursing note and vitals reviewed.    ED Treatments /  Results  Labs (all labs ordered are listed, but only abnormal results are displayed) Labs Reviewed  BASIC METABOLIC PANEL - Abnormal; Notable for the following components:      Result Value   Glucose, Bld 253 (*)    Calcium 7.8 (*)    All other components within normal limits  CBC WITH DIFFERENTIAL/PLATELET     Radiology Dg Chest 2 View  Result Date: 12/09/2017 CLINICAL DATA:  77year old male with shortness of breath. EXAM: CHEST - 2 VIEW COMPARISON:  Chest radiograph dated 08/04/2017 FINDINGS: There is emphysematous changes of the lungs. Areas of streaky density primarily in the  right infrahilar and right lung base may be related to chronic interstitial coarsening and atelectatic changes. Developing infiltrate is not excluded. Clinical correlation is recommended. No focal consolidation, pleural effusion, or pneumothorax. Stable mild cardiomegaly. There is atherosclerotic calcification of the aortic arch. No acute osseous pathology. IMPRESSION: 1. Right lung base interstitial coarsening and atelectatic changes versus developing infiltrate. No focal consolidation. 2. Emphysema. 3. Mild cardiomegaly. Electronically Signed   By: Anner Crete M.D.   On: 12/09/2017 19:25    Procedures .Critical Care Performed by: Dorie Rank, MD Authorized by: Dorie Rank, MD   Critical care provider statement:    Critical care time (minutes):  35   Critical care was time spent personally by me on the following activities:  Discussions with consultants, evaluation of patient's response to treatment, examination of patient, ordering and performing treatments and interventions, ordering and review of laboratory studies, ordering and review of radiographic studies, pulse oximetry, re-evaluation of patient's condition, obtaining history from patient or surrogate and review of old charts   (including critical care time)  Medications Ordered in ED Medications  0.9 %  sodium chloride infusion ( Intravenous New  Bag/Given 12/09/17 1821)  magnesium sulfate IVPB 2 g 50 mL (not administered)  levofloxacin (LEVAQUIN) IVPB 500 mg (not administered)  amLODipine (NORVASC) tablet 5 mg (not administered)  insulin glargine (LANTUS) Solostar Pen 20 Units (not administered)  albuterol (PROVENTIL,VENTOLIN) solution continuous neb (10 mg/hr Nebulization Given 12/09/17 2028)     Initial Impression / Assessment and Plan / ED Course  I have reviewed the triage vital signs and the nursing notes.  Pertinent labs & imaging results that were available during my care of the patient were reviewed by me and considered in my medical decision making (see chart for details).  Clinical Course as of Dec 10 2235  Wed Dec 09, 2017  1943 Still feels short of breath but better than before he came in.  Will try an additional treatment.  Labs and xrays are reassuring.  [JK]    Clinical Course User Index [JK] Dorie Rank, MD   Patient presented to the emergency room for evaluation of shortness of breath.  Patient's symptoms are consistent with a COPD exacerbation.  Patient's laboratory tests are unremarkable with exception of elevated blood glucose level but I do not think this is clinically significant.  Chest x-ray does not show any definite pneumonia however does suggest the possibility of infection.  I will start him on antibiotics..  Patient was treated with breathing treatments as well as steroids.  He was monitored for several hours.  At rest the patient is not hypoxic however as soon as he starts to walk he becomes very short of breath and tachypneic.  The patient will require admission to the hospital for further treatment of his COPD exacerbation.  Final Clinical Impressions(s) / ED Diagnoses   Final diagnoses:  COPD exacerbation ([place])      Dorie Rank, MD 12/09/17 2237

## 2017-12-09 NOTE — H&P (Signed)
History and Physical    Jonathan [name] VQX:[id number] DOB: 03-03-1941 DOA: 12/09/2017  PCP: Abner Greenspan, MD  Patient coming from: Home.  Chief Complaint: Shortness of breath.  HPI: Jonathan [name] is a 77 y.o. male with history of chronic respiratory failure on home oxygen secondary to COPD, hypertension, diabetes mellitus type 2 presents to the ER with complaints of shortness of breath.  Patient states he is chronically short of breath for the last 2 days it has acutely worsened.  Denies any chest pain fever chills productive cough.  Patient is short of breath at rest and worse on exertion.  Has no peripheral edema.  ED Course: In the ER patient is found to be short of breath and wheezing with tight chest.  Chest x-ray shows infiltrate on the right side.  Patient is being admitted for COPD exacerbation.  Review of Systems: As per HPI, rest all negative.   Past Medical History:  Diagnosis Date  . Allergic rhinitis   . Asthma   . COPD (chronic obstructive pulmonary disease) ([place])   . Dermatitis seborrheica   . Diabetes mellitus type II   . ED (erectile dysfunction)   . Frozen shoulder   . HLD (hyperlipidemia)   . HTN (hypertension)   . Hyperkalemia   . Kidney stone   . Other diseases of lung, not elsewhere classified   . Pulmonary nodule, right    lower lobe  . Rotator cuff injury    right  . Shoulder pain   . Stroke [facility])    pt states "last year"  . Tobacco abuse     Past Surgical History:  Procedure Laterality Date  . CERVICAL DISCECTOMY  1998  . EEG    . ETT  11/1994  . TEE WITHOUT CARDIOVERSION  12/03/2011   Procedure: TRANSESOPHAGEAL ECHOCARDIOGRAM (TEE);  Surgeon: Lelon Perla, MD;  Location: [facility] ENDOSCOPY;  Service: Cardiovascular;  Laterality: N/A;  . TM repair       reports that he has been smoking cigarettes.  He has been smoking about 1.00 pack per day. he has never used smokeless tobacco. He reports that he does not drink alcohol or use  drugs.  Allergies  Allergen Reactions  . Amoxicillin-Pot Clavulanate Other (See Comments)    Severe stomach pain.   . Quinapril Hcl     REACTION: back pain  . Valsartan     REACTION: back pain  . Varenicline Tartrate     REACTION: AMS, hallucinations, night mares    Family History  Problem Relation Age of Onset  . Asthma Sister   . Emphysema Sister   . Diabetes Mother   . Diabetes Brother   . Heart disease Brother     Prior to Admission medications   Medication Sig Start Date End Date Taking? Authorizing Provider  ACCU-CHEK AVIVA PLUS test strip USE TO CHECK BLOOD GLUCOSE LEVELS 3 TO 4TIMES DAILY 08/03/17  Yes Philemon Kingdom, MD  albuterol (VENTOLIN HFA) 108 (90 Base) MCG/ACT inhaler Inhale 2 puffs every 6 (six) hours as needed into the lungs for wheezing. 08/06/17  Yes Patrecia Pour, Christean Grief, MD  amLODipine (NORVASC) 5 MG tablet TAKE 1 TABLET BY MOUTH  DAILY 11/06/17  Yes Tower, Wynelle Fanny, MD  aspirin 81 MG tablet Take 1 tablet (81 mg total) by mouth daily. 11/26/13  Yes Ghimire, Henreitta Leber, MD  atorvastatin (LIPITOR) 10 MG tablet Take 1 tablet (10 mg total) by mouth daily. 07/14/17  Yes Tower, Wynelle Fanny, MD  Blood Glucose Monitoring Suppl (ACCU-CHEK AVIVA PLUS) W/DEVICE KIT USE AS DIRECTED 12/13/14  Yes Tower, Wynelle Fanny, MD  dextromethorphan-guaiFENesin (MUCINEX DM) 30-600 MG 12hr tablet Take 1 tablet 2 (two) times daily by mouth. 08/06/17  Yes Patrecia Pour, Christean Grief, MD  fluticasone furoate-vilanterol (BREO ELLIPTA) 100-25 MCG/INH AEPB Inhale 1 puff daily into the lungs. 08/06/17  Yes Patrecia Pour, Christean Grief, MD  glipiZIDE (GLUCOTROL XL) 10 MG 24 hr tablet TAKE 1 TABLET IN THE  MORNING AND HALF A TABLET  BEFORE DINNER Patient taking differently: TAKE 1 TABLET IN THE  MORNING 04/29/17  Yes Philemon Kingdom, MD  GLOBAL EASE INJECT PEN NEEDLES 32G X 4 MM MISC AS DIRECTED 06/03/16  Yes Philemon Kingdom, MD  insulin glargine (LANTUS) 100 unit/mL SOPN Inject 0.2 mLs (20 Units total) into the skin daily at 8  pm. 10/30/16  Yes Philemon Kingdom, MD  insulin lispro (HUMALOG) 100 UNIT/ML KiwkPen Inject 0.04-0.06 mLs (4-6 Units total) 3 (three) times daily into the skin. 08/14/17  Yes Philemon Kingdom, MD  metFORMIN (GLUCOPHAGE) 1000 MG tablet Take 1,079m by mouth twice a day 09/09/17  Yes GPhilemon Kingdom MD  albuterol (PROVENTIL) (2.5 MG/3ML) 0.083% nebulizer solution Take 3 mLs (2.5 mg total) every 4 (four) hours as needed by nebulization for shortness of breath. Patient not taking: Reported on 12/09/2017 08/06/17   SDoreatha Lew MD    Physical Exam: Vitals:   12/09/17 1801 12/09/17 1942 12/09/17 2028 12/09/17 2152  BP: (!) 110/93 (!) 139/98  95/76  Pulse: 100 99  (!) 107  Resp:  16  20  Temp: 98 F (36.7 C)     TempSrc: Oral     SpO2: 99% 98% 98% 97%      Constitutional: Moderately built and nourished. Vitals:   12/09/17 1801 12/09/17 1942 12/09/17 2028 12/09/17 2152  BP: (!) 110/93 (!) 139/98  95/76  Pulse: 100 99  (!) 107  Resp:  16  20  Temp: 98 F (36.7 C)     TempSrc: Oral     SpO2: 99% 98% 98% 97%   Eyes: Anicteric mild pallor. ENMT: No discharge from the ears eyes nose or mouth. Neck: No mass felt.  No JVD appreciated. Respiratory: Bilateral tight air entry.  No crepitations. Cardiovascular: S1-S2 heard no murmurs appreciated. Abdomen: Soft nontender bowel sounds present. Musculoskeletal: No edema.  No joint effusion. Skin: No rash. Neurologic: Alert awake oriented to time place and person.  Moves all extremities. Psychiatric: Appears normal.  Normal affect.   Labs on Admission: I have personally reviewed following labs and imaging studies  CBC: Recent Labs  Lab 12/09/17 1822  WBC 7.8  NEUTROABS 5.4  HGB 15.5  HCT 47.0  MCV 87.9  PLT 2371  Basic Metabolic Panel: Recent Labs  Lab 12/09/17 1822  NA 138  K 4.4  CL 102  CO2 27  GLUCOSE 253*  BUN 11  CREATININE 0.84  CALCIUM 7.8*   GFR: CrCl cannot be calculated (Unknown ideal  weight.). Liver Function Tests: No results for input(s): AST, ALT, ALKPHOS, BILITOT, PROT, ALBUMIN in the last 168 hours. No results for input(s): LIPASE, AMYLASE in the last 168 hours. No results for input(s): AMMONIA in the last 168 hours. Coagulation Profile: No results for input(s): INR, PROTIME in the last 168 hours. Cardiac Enzymes: No results for input(s): CKTOTAL, CKMB, CKMBINDEX, TROPONINI in the last 168 hours. BNP (last 3 results) No results for input(s): PROBNP in the last 8760 hours. HbA1C: No results for input(s):  HGBA1C in the last 72 hours. CBG: No results for input(s): GLUCAP in the last 168 hours. Lipid Profile: No results for input(s): CHOL, HDL, LDLCALC, TRIG, CHOLHDL, LDLDIRECT in the last 72 hours. Thyroid Function Tests: No results for input(s): TSH, T4TOTAL, FREET4, T3FREE, THYROIDAB in the last 72 hours. Anemia Panel: No results for input(s): VITAMINB12, FOLATE, FERRITIN, TIBC, IRON, RETICCTPCT in the last 72 hours. Urine analysis:    Component Value Date/Time   COLORURINE YELLOW 12/14/2013 1652   APPEARANCEUR CLEAR 12/14/2013 1652   LABSPEC 1.024 12/14/2013 1652   PHURINE 6.5 12/14/2013 1652   GLUCOSEU NEGATIVE 12/14/2013 1652   HGBUR NEGATIVE 12/14/2013 1652   HGBUR large 08/30/2007 1447   BILIRUBINUR neg. 02/21/2014 1503   KETONESUR NEGATIVE 12/14/2013 1652   PROTEINUR Trace 02/21/2014 1503   PROTEINUR NEGATIVE 12/14/2013 1652   UROBILINOGEN 0.2 02/21/2014 1503   UROBILINOGEN 1.0 12/14/2013 1652   NITRITE neg. 02/21/2014 1503   NITRITE NEGATIVE 12/14/2013 1652   LEUKOCYTESUR Trace 02/21/2014 1503   Sepsis Labs: @LABRCNTIP (procalcitonin:4,lacticidven:4) )No results found for this or any previous visit (from the past 240 hour(s)).   Radiological Exams on Admission: Dg Chest 2 View  Result Date: 12/09/2017 CLINICAL DATA:  77 year old male with shortness of breath. EXAM: CHEST - 2 VIEW COMPARISON:  Chest radiograph dated 08/04/2017 FINDINGS:  There is emphysematous changes of the lungs. Areas of streaky density primarily in the right infrahilar and right lung base may be related to chronic interstitial coarsening and atelectatic changes. Developing infiltrate is not excluded. Clinical correlation is recommended. No focal consolidation, pleural effusion, or pneumothorax. Stable mild cardiomegaly. There is atherosclerotic calcification of the aortic arch. No acute osseous pathology. IMPRESSION: 1. Right lung base interstitial coarsening and atelectatic changes versus developing infiltrate. No focal consolidation. 2. Emphysema. 3. Mild cardiomegaly. Electronically Signed   By: Anner Crete M.D.   On: 12/09/2017 19:25    Assessment/Plan Active Problems:   Essential hypertension   HLD (hyperlipidemia)   Type 2 diabetes mellitus with hyperglycemia, with long-term current use of insulin (HCC)   History of stroke   COPD with acute exacerbation (HCC)   COPD exacerbation ([place])    1. Acute respiratory failure with hypoxia likely from COPD exacerbation -patient has been placed on IV Solu-Medrol Pulmicort nebulizer and Levaquin.  Since patient has persistent infiltrates on the right side compared to the last admission in October we will get a CT angiogram of the chest. 2. Hypertension on amlodipine. 3. Diabetes mellitus type 2 on Lantus insulin 20 units at bedtime.  Since patient is on Solu-Medrol, patient's blood sugar is likely to go up.  I have placed patient on moderate dose sliding scale coverage.  We will hold oral hypoglycemics while inpatient. 4. Hyperlipidemia on statins. 5. History of stroke and previous lesions on statins.  Has been weaned off the seizure medications.  On aspirin and statins.  Since patient has exertional symptoms I have ordered BNP EKG and troponin.  If any of which are abnormal consider 2D echo.   DVT prophylaxis: Lovenox. Code Status: Full code. Family Communication: Patient's wife and daughter. Disposition  Plan: Home. Consults called: None. Admission status: Inpatient.   Rise Patience MD [facility] Pager [phone].  If 7PM-7AM, please contact night-coverage [email or web address] Password [facility]  12/09/2017, 11:44 PM

## 2017-12-09 NOTE — Progress Notes (Signed)
Pt complains of sob.  Attempted peak flow but pt unable to do at this time.  Bilateral wheezes throughout. RN notified.

## 2017-12-09 NOTE — ED Notes (Signed)
Bed: JG28 Expected date:  Expected time:  Means of arrival:  Comments: NEEDS HOUSEKEEPING ATTENTION

## 2017-12-09 NOTE — ED Triage Notes (Signed)
He c/o increased shortness of breath plus wheezing since yesterday evening. He cites hx of COPD; and also cites hospitalization in Nov. Of 2018 for pneumonia. He arrives here short of breath, having received a total of Albuterol 10mg ; Atrovent 1 mg nebulized and 125mg  Solu Medrol IV en route to hospital.

## 2017-12-10 ENCOUNTER — Other Ambulatory Visit: Payer: Self-pay

## 2017-12-10 ENCOUNTER — Encounter (HOSPITAL_COMMUNITY): Payer: Self-pay

## 2017-12-10 ENCOUNTER — Inpatient Hospital Stay (HOSPITAL_COMMUNITY): Payer: Medicare Other

## 2017-12-10 DIAGNOSIS — J441 Chronic obstructive pulmonary disease with (acute) exacerbation: Principal | ICD-10-CM

## 2017-12-10 DIAGNOSIS — I10 Essential (primary) hypertension: Secondary | ICD-10-CM

## 2017-12-10 DIAGNOSIS — E1165 Type 2 diabetes mellitus with hyperglycemia: Secondary | ICD-10-CM

## 2017-12-10 DIAGNOSIS — J9621 Acute and chronic respiratory failure with hypoxia: Secondary | ICD-10-CM

## 2017-12-10 DIAGNOSIS — Z794 Long term (current) use of insulin: Secondary | ICD-10-CM

## 2017-12-10 DIAGNOSIS — N179 Acute kidney failure, unspecified: Secondary | ICD-10-CM

## 2017-12-10 LAB — CBC
HCT: 49.4 % (ref 39.0–52.0)
Hemoglobin: 16.1 g/dL (ref 13.0–17.0)
MCH: 28.7 pg (ref 26.0–34.0)
MCHC: 32.6 g/dL (ref 30.0–36.0)
MCV: 88.1 fL (ref 78.0–100.0)
Platelets: 249 10*3/uL (ref 150–400)
RBC: 5.61 MIL/uL (ref 4.22–5.81)
RDW: 14 % (ref 11.5–15.5)
WBC: 7.7 10*3/uL (ref 4.0–10.5)

## 2017-12-10 LAB — BASIC METABOLIC PANEL
Anion gap: 15 (ref 5–15)
BUN: 20 mg/dL (ref 6–20)
CO2: 25 mmol/L (ref 22–32)
Calcium: 9 mg/dL (ref 8.9–10.3)
Chloride: 94 mmol/L — ABNORMAL LOW (ref 101–111)
Creatinine, Ser: 1.41 mg/dL — ABNORMAL HIGH (ref 0.61–1.24)
GFR calc Af Amer: 54 mL/min — ABNORMAL LOW (ref >60)
GFR calc non Af Amer: 47 mL/min — ABNORMAL LOW (ref >60)
Glucose, Bld: 469 mg/dL — ABNORMAL HIGH (ref 65–99)
Potassium: 4.8 mmol/L (ref 3.5–5.1)
Sodium: 134 mmol/L — ABNORMAL LOW (ref 135–145)

## 2017-12-10 LAB — CREATININE, SERUM
Creatinine, Ser: 1.36 mg/dL — ABNORMAL HIGH (ref 0.61–1.24)
GFR calc Af Amer: 56 mL/min — ABNORMAL LOW (ref >60)
GFR calc non Af Amer: 49 mL/min — ABNORMAL LOW (ref >60)

## 2017-12-10 LAB — GLUCOSE, CAPILLARY
Glucose-Capillary: 387 mg/dL — ABNORMAL HIGH (ref 65–99)
Glucose-Capillary: 225 mg/dL — ABNORMAL HIGH (ref 65–99)
Glucose-Capillary: 283 mg/dL — ABNORMAL HIGH (ref 65–99)
Glucose-Capillary: 281 mg/dL — ABNORMAL HIGH (ref 65–99)

## 2017-12-10 LAB — BRAIN NATRIURETIC PEPTIDE: B Natriuretic Peptide: 51.4 pg/mL (ref 0.0–100.0)

## 2017-12-10 LAB — TROPONIN I: Troponin I: 0.04 ng/mL (ref <0.03)

## 2017-12-10 LAB — INFLUENZA PANEL BY PCR (TYPE A & B)
Influenza A By PCR: NEGATIVE
Influenza B By PCR: NEGATIVE

## 2017-12-10 MED ORDER — INSULIN ASPART 100 UNIT/ML ~~LOC~~ SOLN
0.0000 [IU] | Freq: Three times a day (TID) | SUBCUTANEOUS | Status: DC
  Administered 2017-12-10: 5 [IU] via SUBCUTANEOUS
  Administered 2017-12-10: 3 [IU] via SUBCUTANEOUS
  Administered 2017-12-11: 5 [IU] via SUBCUTANEOUS

## 2017-12-10 MED ORDER — CHLORHEXIDINE GLUCONATE 0.12 % MT SOLN
15.0000 mL | Freq: Two times a day (BID) | OROMUCOSAL | Status: DC
  Administered 2017-12-10 – 2017-12-12 (×5): 15 mL via OROMUCOSAL
  Filled 2017-12-10 (×5): qty 15

## 2017-12-10 MED ORDER — INSULIN ASPART 100 UNIT/ML ~~LOC~~ SOLN
0.0000 [IU] | Freq: Three times a day (TID) | SUBCUTANEOUS | Status: DC
  Administered 2017-12-10: 20 [IU] via SUBCUTANEOUS

## 2017-12-10 MED ORDER — INSULIN ASPART 100 UNIT/ML ~~LOC~~ SOLN: 0.0000 [IU] | Freq: Three times a day (TID) | SUBCUTANEOUS | Status: DC

## 2017-12-10 MED ORDER — IOPAMIDOL (ISOVUE-370) INJECTION 76%
100.0000 mL | Freq: Once | INTRAVENOUS | Status: AC | PRN
  Administered 2017-12-10: 100 mL via INTRAVENOUS

## 2017-12-10 MED ORDER — INSULIN ASPART 100 UNIT/ML ~~LOC~~ SOLN: 0.0000 [IU] | Freq: Every day | SUBCUTANEOUS | Status: DC

## 2017-12-10 MED ORDER — GLIPIZIDE ER 10 MG PO TB24: 10.0000 mg | ORAL_TABLET | Freq: Every day | ORAL | Status: DC

## 2017-12-10 MED ORDER — ORAL CARE MOUTH RINSE
15.0000 mL | Freq: Two times a day (BID) | OROMUCOSAL | Status: DC
  Administered 2017-12-10: 15 mL via OROMUCOSAL

## 2017-12-10 MED ORDER — SODIUM CHLORIDE 0.9 % IJ SOLN
INTRAMUSCULAR | Status: AC
  Filled 2017-12-10: qty 50

## 2017-12-10 MED ORDER — SODIUM CHLORIDE 0.9 % IV SOLN
INTRAVENOUS | Status: AC
  Administered 2017-12-10 (×2): via INTRAVENOUS

## 2017-12-10 MED ORDER — INSULIN ASPART 100 UNIT/ML ~~LOC~~ SOLN: 3.0000 [IU] | Freq: Three times a day (TID) | SUBCUTANEOUS | Status: DC

## 2017-12-10 MED ORDER — DOXYCYCLINE HYCLATE 100 MG PO TABS
100.0000 mg | ORAL_TABLET | Freq: Two times a day (BID) | ORAL | Status: DC
  Administered 2017-12-10 – 2017-12-12 (×5): 100 mg via ORAL
  Filled 2017-12-10 (×5): qty 1

## 2017-12-10 MED ORDER — IOPAMIDOL (ISOVUE-370) INJECTION 76%
INTRAVENOUS | Status: AC
  Filled 2017-12-10: qty 100

## 2017-12-10 MED ORDER — METHYLPREDNISOLONE SODIUM SUCC 40 MG IJ SOLR
40.0000 mg | Freq: Two times a day (BID) | INTRAMUSCULAR | Status: DC
  Administered 2017-12-10 – 2017-12-11 (×5): 40 mg via INTRAVENOUS
  Filled 2017-12-10 (×5): qty 1

## 2017-12-10 NOTE — Progress Notes (Signed)
CRITICAL VALUE ALERT  Critical Value:  Troponin  Date & Time Notied:  6:51 AM 12/10/17  Provider Notified: Schorr  Orders Received/Actions taken: Notified on call

## 2017-12-10 NOTE — Progress Notes (Signed)
[facility] PROGRESS NOTE    Progress Note  Jonathan [name]  BOF:[id number] DOB: 1941/03/21 DOA: 12/09/2017 PCP: Abner Greenspan, MD     Brief Narrative:   Jonathan [name] is an 77 y.o. male past medical history of chronic respiratory failures secondary to COPD on oxygen, diabetes mellitus type 2 that comes to the ED complaining of shortness of breath  Assessment/Plan:   Acute respiratory failure with hypoxia due to COPD with acute exacerbation [facility]): Agree with IV steroids, a CT this morning showed no signs of infiltrates with DC Levaquin and start him on oral doxy. Continues to wheeze on physical exam moving good air.  Has remained afebrile  PSeudohyponatremia: Likely due to hyperglycemia we will start her on sliding scale insulin without at bedtime coverage CBGs before meals and at bedtime  Essential hypertension: Continue amlodipine. Will re-evaluate once his renal function is improved as he needs to be either an ARB or an ACE inhibitor.  Type 2 diabetes mellitus with hyperglycemia, with long-term current use of insulin (HCC) Continue long-acting insulin plus sliding scale, resistant.  HLD (hyperlipidemia) Continue statins.  Acute renal failure: Check urinary sodium and a urinary creatinine, check a UA. He had a CT Angie on 12/09/2017 Start IV fluids avoid nephrotoxic drugs allow permissive hypertension.  DVT prophylaxis: lovenox Family Communication:none Disposition Plan/Barrier to D/C: home in 1 day Code Status:     Code Status Orders  (From admission, onward)        Start     Ordered   12/09/17 2341  Full code  Continuous     12/09/17 2342    Code Status History    Date Active Date Inactive Code Status Order ID Comments User Context   08/04/2017 21:35 08/06/2017 19:40 Full Code [id number]  Vianne Bulls, MD ED   12/14/2013 21:48 12/19/2013 22:00 Full Code [id number]  Theressa Millard, MD Inpatient   11/28/2013 08:06 12/08/2013 18:13 Full Code [id number]   Rise Patience, MD Inpatient   11/28/2013 06:47 11/28/2013 08:06 Full Code [id number]  Rhodia Albright, NP ED   11/23/2013 16:05 11/26/2013 19:51 Full Code [id number]  Domenic Polite, MD ED        IV Access:    Peripheral IV   Procedures and diagnostic studies:   Dg Chest 2 View  Result Date: 12/09/2017 CLINICAL DATA:  77 year old male with shortness of breath. EXAM: CHEST - 2 VIEW COMPARISON:  Chest radiograph dated 08/04/2017 FINDINGS: There is emphysematous changes of the lungs. Areas of streaky density primarily in the right infrahilar and right lung base may be related to chronic interstitial coarsening and atelectatic changes. Developing infiltrate is not excluded. Clinical correlation is recommended. No focal consolidation, pleural effusion, or pneumothorax. Stable mild cardiomegaly. There is atherosclerotic calcification of the aortic arch. No acute osseous pathology. IMPRESSION: 1. Right lung base interstitial coarsening and atelectatic changes versus developing infiltrate. No focal consolidation. 2. Emphysema. 3. Mild cardiomegaly. Electronically Signed   By: Anner Crete M.D.   On: 12/09/2017 19:25   Ct Angio Chest Pe W Or Wo Contrast  Result Date: 12/10/2017 CLINICAL DATA:  Acute onset of shortness of breath and wheezing. EXAM: CT ANGIOGRAPHY CHEST WITH CONTRAST TECHNIQUE: Multidetector CT imaging of the chest was performed using the standard protocol during bolus administration of intravenous contrast. Multiplanar CT image reconstructions and MIPs were obtained to evaluate the vascular anatomy. CONTRAST:  152mL ISOVUE-370 IOPAMIDOL (ISOVUE-370) INJECTION 76% COMPARISON:  Chest radiograph performed 12/09/2017, and CTA of the  chest performed 11/28/2013 FINDINGS: Cardiovascular:  There is no evidence of pulmonary embolus. The heart is normal in size. Diffuse coronary artery calcifications are seen. Mild calcification is noted at the aortic arch and proximal great vessels.  Mediastinum/Nodes: No mediastinal lymphadenopathy is seen. No pericardial effusion is identified. The visualized portions of the thyroid gland are unremarkable. No axillary lymphadenopathy is seen. Lungs/Pleura: Bilateral emphysema is noted. No pleural effusion or pneumothorax is seen. No masses are identified. Upper Abdomen: The visualized portions of the liver and spleen are unremarkable. The visualized portions of the gallbladder, pancreas, adrenal glands and kidneys are within normal limits. Musculoskeletal: No acute osseous abnormalities are identified. The visualized musculature is unremarkable in appearance. Review of the MIP images confirms the above findings. IMPRESSION: 1. No evidence of pulmonary embolus. 2. Bilateral emphysema.  Lungs otherwise clear. 3. Diffuse coronary artery calcifications. Electronically Signed   By: Garald Balding M.D.   On: 12/10/2017 03:45     Medical Consultants:    None.  Anti-Infectives:   Oral doxy  Subjective:    Jonathan [name] he relates he still short of breath, denies any chest pain.  Objective:    Vitals:   12/10/17 0008 12/10/17 0323 12/10/17 0632 12/10/17 0745  BP: 126/78     Pulse: (!) 116  90   Resp: (!) 26     Temp: (!) 97.5 F (36.4 C)     TempSrc: Axillary     SpO2: 100% 97%  98%  Weight: 79.7 kg (175 lb 11.3 oz)     Height: 5\' 7"  (1.702 m)       Intake/Output Summary (Last 24 hours) at 12/10/2017 0821 Last data filed at 12/10/2017 0600 Gross per 24 hour  Intake 600 ml  Output 575 ml  Net 25 ml   Filed Weights   12/10/17 0008  Weight: 79.7 kg (175 lb 11.3 oz)    Exam: General exam: In no acute distress. Respiratory system: Good air movement with wheezing bilaterally. Cardiovascular system: S1 & S2 heard, RRR. No JVD. Gastrointestinal system: Abdomen is nondistended, soft and nontender.  Central nervous system: Alert and oriented. No focal neurological deficits. Extremities: No pedal edema. Skin: No rashes, lesions  or ulcers Psychiatry: Judgement and insight appear normal. Mood & affect appropriate.    Data Reviewed:    Labs: Basic Metabolic Panel: Recent Labs  Lab 12/09/17 1822 12/10/17 0513  NA 138 134*  K 4.4 4.8  CL 102 94*  CO2 27 25  GLUCOSE 253* 469*  BUN 11 20  CREATININE 0.84 1.41*  1.36*  CALCIUM 7.8* 9.0   GFR Estimated Creatinine Clearance: 44.4 mL/min (A) (by C-G formula based on SCr of 1.41 mg/dL (H)). Liver Function Tests: No results for input(s): AST, ALT, ALKPHOS, BILITOT, PROT, ALBUMIN in the last 168 hours. No results for input(s): LIPASE, AMYLASE in the last 168 hours. No results for input(s): AMMONIA in the last 168 hours. Coagulation profile No results for input(s): INR, PROTIME in the last 168 hours.  CBC: Recent Labs  Lab 12/09/17 1822 12/10/17 0513  WBC 7.8 7.7  NEUTROABS 5.4  --   HGB 15.5 16.1  HCT 47.0 49.4  MCV 87.9 88.1  PLT 225 249   Cardiac Enzymes: Recent Labs  Lab 12/10/17 0513  TROPONINI 0.04*   BNP (last 3 results) No results for input(s): PROBNP in the last 8760 hours. CBG: Recent Labs  Lab 12/10/17 0802  GLUCAP 387*   D-Dimer: No results for input(s): DDIMER in  the last 72 hours. Hgb A1c: No results for input(s): HGBA1C in the last 72 hours. Lipid Profile: No results for input(s): CHOL, HDL, LDLCALC, TRIG, CHOLHDL, LDLDIRECT in the last 72 hours. Thyroid function studies: No results for input(s): TSH, T4TOTAL, T3FREE, THYROIDAB in the last 72 hours.  Invalid input(s): FREET3 Anemia work up: No results for input(s): VITAMINB12, FOLATE, FERRITIN, TIBC, IRON, RETICCTPCT in the last 72 hours. Sepsis Labs: Recent Labs  Lab 12/09/17 1822 12/10/17 0513  WBC 7.8 7.7   Microbiology No results found for this or any previous visit (from the past 240 hour(s)).   Medications:   . amLODipine  5 mg Oral Daily  . aspirin EC  81 mg Oral Daily  . atorvastatin  10 mg Oral Daily  . budesonide (PULMICORT) nebulizer solution   0.25 mg Nebulization BID  . chlorhexidine  15 mL Mouth Rinse BID  . dextromethorphan-guaiFENesin  1 tablet Oral BID  . enoxaparin (LOVENOX) injection  40 mg Subcutaneous QHS  . insulin aspart  0-15 Units Subcutaneous TID WC  . insulin glargine  20 Units Subcutaneous Q2000  . iopamidol      . ipratropium-albuterol  3 mL Nebulization Q4H  . mouth rinse  15 mL Mouth Rinse q12n4p  . methylPREDNISolone (SOLU-MEDROL) injection  40 mg Intravenous BID  . sodium chloride       Continuous Infusions: . levofloxacin (LEVAQUIN) IV        LOS: 1 day   [place] [facility] Pager [phone]  *Please refer to [email or web address], password [email or web address] to get updated schedule on who will round on this patient, as hospitalists switch teams weekly. If 7PM-7AM, please contact night-coverage at [email or web address], password [email or web address] for any overnight needs.  12/10/2017, 8:21 AM

## 2017-12-11 LAB — BASIC METABOLIC PANEL
Anion gap: 10 (ref 5–15)
BUN: 22 mg/dL — ABNORMAL HIGH (ref 6–20)
CO2: 28 mmol/L (ref 22–32)
Calcium: 8.6 mg/dL — ABNORMAL LOW (ref 8.9–10.3)
Chloride: 101 mmol/L (ref 101–111)
Creatinine, Ser: 1.08 mg/dL (ref 0.61–1.24)
GFR calc Af Amer: >60 mL/min (ref >60)
GFR calc non Af Amer: >60 mL/min (ref >60)
Glucose, Bld: 338 mg/dL — ABNORMAL HIGH (ref 65–99)
Potassium: 4.6 mmol/L (ref 3.5–5.1)
Sodium: 139 mmol/L (ref 135–145)

## 2017-12-11 LAB — GLUCOSE, CAPILLARY
Glucose-Capillary: 289 mg/dL — ABNORMAL HIGH (ref 65–99)
Glucose-Capillary: 224 mg/dL — ABNORMAL HIGH (ref 65–99)
Glucose-Capillary: 224 mg/dL — ABNORMAL HIGH (ref 65–99)
Glucose-Capillary: 188 mg/dL — ABNORMAL HIGH (ref 65–99)

## 2017-12-11 MED ORDER — INSULIN ASPART 100 UNIT/ML ~~LOC~~ SOLN
0.0000 [IU] | Freq: Three times a day (TID) | SUBCUTANEOUS | Status: DC
  Administered 2017-12-11 (×2): 7 [IU] via SUBCUTANEOUS
  Administered 2017-12-12: 11 [IU] via SUBCUTANEOUS

## 2017-12-11 MED ORDER — IPRATROPIUM-ALBUTEROL 0.5-2.5 (3) MG/3ML IN SOLN
3.0000 mL | Freq: Four times a day (QID) | RESPIRATORY_TRACT | Status: DC
  Administered 2017-12-12 (×2): 3 mL via RESPIRATORY_TRACT
  Filled 2017-12-11: qty 3

## 2017-12-11 MED ORDER — INSULIN GLARGINE 100 UNIT/ML ~~LOC~~ SOLN
25.0000 [IU] | Freq: Every day | SUBCUTANEOUS | Status: DC
  Administered 2017-12-11: 25 [IU] via SUBCUTANEOUS
  Filled 2017-12-11: qty 0.25

## 2017-12-11 MED ORDER — LISINOPRIL 5 MG PO TABS
5.0000 mg | ORAL_TABLET | Freq: Every day | ORAL | Status: DC
  Administered 2017-12-11 – 2017-12-12 (×2): 5 mg via ORAL
  Filled 2017-12-11 (×2): qty 1

## 2017-12-11 MED ORDER — INSULIN ASPART 100 UNIT/ML ~~LOC~~ SOLN: 0.0000 [IU] | Freq: Every day | SUBCUTANEOUS | Status: DC

## 2017-12-11 MED ORDER — CEPASTAT 14.5 MG MT LOZG
1.0000 | LOZENGE | OROMUCOSAL | Status: DC | PRN
  Filled 2017-12-11: qty 9

## 2017-12-11 MED ORDER — MENTHOL 3 MG MT LOZG
1.0000 | LOZENGE | OROMUCOSAL | Status: DC | PRN
  Filled 2017-12-11: qty 9

## 2017-12-11 MED ORDER — INSULIN ASPART 100 UNIT/ML ~~LOC~~ SOLN
6.0000 [IU] | Freq: Three times a day (TID) | SUBCUTANEOUS | Status: DC
  Administered 2017-12-11 (×2): 6 [IU] via SUBCUTANEOUS

## 2017-12-11 NOTE — Progress Notes (Signed)
[facility] PROGRESS NOTE    Progress Note  Jonathan [name]  AJO:[id number] DOB: 14-May-1941 DOA: 12/09/2017 PCP: Abner Greenspan, MD     Brief Narrative:   Jonathan [name] is an 77 y.o. male past medical history of chronic respiratory failures secondary to COPD on oxygen, diabetes mellitus type 2 that comes to the ED complaining of shortness of breath  Assessment/Plan:   Acute respiratory failure with hypoxia due to COPD with acute exacerbation ([place]): Continue IV steroids, inhalers and antibiotics. Ambulate patient and check saturations. Minimal wheezing on physical exam with poor air movement today he has remained afebrile looks weak and tired.  PSeudohyponatremia: Resolved with improvement of blood glucose.  Essential hypertension: Continue amlodipine. We will start lisinopril per chart he has history of allergies to ACE inhibitor and his back pain.  We will try a low dose.  Type 2 diabetes mellitus with hyperglycemia, with long-term current use of insulin (HCC) Continue long-acting insulin plus sliding scale, resistant. Needs to be on an ACE inhibitor as an outpatient due to diabetes.  HLD (hyperlipidemia) Continue statins.  Acute renal failure: Pre-Renal etiology started on IV fluids and creatinine improved. Awaiting urinary sodium and urinary cramping.  DVT prophylaxis: lovenox Family Communication:none Disposition Plan/Barrier to D/C: home in am Code Status:     Code Status Orders  (From admission, onward)        Start     Ordered   12/09/17 2341  Full code  Continuous     12/09/17 2342    Code Status History    Date Active Date Inactive Code Status Order ID Comments User Context   08/04/2017 21:35 08/06/2017 19:40 Full Code [id number]  Vianne Bulls, MD ED   12/14/2013 21:48 12/19/2013 22:00 Full Code [id number]  Theressa Millard, MD Inpatient   11/28/2013 08:06 12/08/2013 18:13 Full Code [id number]  Rise Patience, MD Inpatient   11/28/2013 06:47  11/28/2013 08:06 Full Code [id number]  Rhodia Albright, NP ED   11/23/2013 16:05 11/26/2013 19:51 Full Code [id number]  Domenic Polite, MD ED        IV Access:    Peripheral IV   Procedures and diagnostic studies:   Dg Chest 2 View  Result Date: 12/09/2017 CLINICAL DATA:  77 year old male with shortness of breath. EXAM: CHEST - 2 VIEW COMPARISON:  Chest radiograph dated 08/04/2017 FINDINGS: There is emphysematous changes of the lungs. Areas of streaky density primarily in the right infrahilar and right lung base may be related to chronic interstitial coarsening and atelectatic changes. Developing infiltrate is not excluded. Clinical correlation is recommended. No focal consolidation, pleural effusion, or pneumothorax. Stable mild cardiomegaly. There is atherosclerotic calcification of the aortic arch. No acute osseous pathology. IMPRESSION: 1. Right lung base interstitial coarsening and atelectatic changes versus developing infiltrate. No focal consolidation. 2. Emphysema. 3. Mild cardiomegaly. Electronically Signed   By: Anner Crete M.D.   On: 12/09/2017 19:25   Ct Angio Chest Pe W Or Wo Contrast  Result Date: 12/10/2017 CLINICAL DATA:  Acute onset of shortness of breath and wheezing. EXAM: CT ANGIOGRAPHY CHEST WITH CONTRAST TECHNIQUE: Multidetector CT imaging of the chest was performed using the standard protocol during bolus administration of intravenous contrast. Multiplanar CT image reconstructions and MIPs were obtained to evaluate the vascular anatomy. CONTRAST:  120mL ISOVUE-370 IOPAMIDOL (ISOVUE-370) INJECTION 76% COMPARISON:  Chest radiograph performed 12/09/2017, and CTA of the chest performed 11/28/2013 FINDINGS: Cardiovascular:  There is no evidence of pulmonary embolus. The heart is  normal in size. Diffuse coronary artery calcifications are seen. Mild calcification is noted at the aortic arch and proximal great vessels. Mediastinum/Nodes: No mediastinal lymphadenopathy is seen. No  pericardial effusion is identified. The visualized portions of the thyroid gland are unremarkable. No axillary lymphadenopathy is seen. Lungs/Pleura: Bilateral emphysema is noted. No pleural effusion or pneumothorax is seen. No masses are identified. Upper Abdomen: The visualized portions of the liver and spleen are unremarkable. The visualized portions of the gallbladder, pancreas, adrenal glands and kidneys are within normal limits. Musculoskeletal: No acute osseous abnormalities are identified. The visualized musculature is unremarkable in appearance. Review of the MIP images confirms the above findings. IMPRESSION: 1. No evidence of pulmonary embolus. 2. Bilateral emphysema.  Lungs otherwise clear. 3. Diffuse coronary artery calcifications. Electronically Signed   By: Garald Balding M.D.   On: 12/10/2017 03:45     Medical Consultants:    None.  Anti-Infectives:   Oral doxy  Subjective:    Jonathan [name] he relates his shortness of breath is not improved, denies any chest pain.  Objective:    Vitals:   12/10/17 2019 12/10/17 2343 12/11/17 0415 12/11/17 0440  BP: (!) 141/60   (!) 142/71  Pulse: 95   97  Resp: 16   20  Temp: 98.1 F (36.7 C)   98.2 F (36.8 C)  TempSrc: Oral   Oral  SpO2: 100% 99% 98% 99%  Weight:      Height:        Intake/Output Summary (Last 24 hours) at 12/11/2017 0850 Last data filed at 12/11/2017 0827 Gross per 24 hour  Intake 1526.25 ml  Output 1650 ml  Net -123.75 ml   Filed Weights   12/10/17 0008  Weight: 79.7 kg (175 lb 11.3 oz)    Exam: General exam: In no acute distress. Respiratory system: Moderate air movement, with decreased wheezing bilaterally. Cardiovascular system: S1 & S2 heard, RRR. No JVD. Gastrointestinal system: Abdomen is nondistended, soft and nontender.  Central nervous system: Alert and oriented. No focal neurological deficits. Extremities: No pedal edema. Skin: No rashes, lesions or ulcers Psychiatry: Judgement and  insight appear normal. Mood & affect appropriate.    Data Reviewed:    Labs: Basic Metabolic Panel: Recent Labs  Lab 12/09/17 1822 12/10/17 0513 12/11/17 0447  NA 138 134* 139  K 4.4 4.8 4.6  CL 102 94* 101  CO2 27 25 28   GLUCOSE 253* 469* 338*  BUN 11 20 22*  CREATININE 0.84 1.41*  1.36* 1.08  CALCIUM 7.8* 9.0 8.6*   GFR Estimated Creatinine Clearance: 57.9 mL/min (by C-G formula based on SCr of 1.08 mg/dL). Liver Function Tests: No results for input(s): AST, ALT, ALKPHOS, BILITOT, PROT, ALBUMIN in the last 168 hours. No results for input(s): LIPASE, AMYLASE in the last 168 hours. No results for input(s): AMMONIA in the last 168 hours. Coagulation profile No results for input(s): INR, PROTIME in the last 168 hours.  CBC: Recent Labs  Lab 12/09/17 1822 12/10/17 0513  WBC 7.8 7.7  NEUTROABS 5.4  --   HGB 15.5 16.1  HCT 47.0 49.4  MCV 87.9 88.1  PLT 225 249   Cardiac Enzymes: Recent Labs  Lab 12/10/17 0513  TROPONINI 0.04*   BNP (last 3 results) No results for input(s): PROBNP in the last 8760 hours. CBG: Recent Labs  Lab 12/10/17 0802 12/10/17 1136 12/10/17 1721 12/10/17 2023 12/11/17 0729  GLUCAP 387* 225* 283* 281* 289*   D-Dimer: No results for input(s): DDIMER  in the last 72 hours. Hgb A1c: No results for input(s): HGBA1C in the last 72 hours. Lipid Profile: No results for input(s): CHOL, HDL, LDLCALC, TRIG, CHOLHDL, LDLDIRECT in the last 72 hours. Thyroid function studies: No results for input(s): TSH, T4TOTAL, T3FREE, THYROIDAB in the last 72 hours.  Invalid input(s): FREET3 Anemia work up: No results for input(s): VITAMINB12, FOLATE, FERRITIN, TIBC, IRON, RETICCTPCT in the last 72 hours. Sepsis Labs: Recent Labs  Lab 12/09/17 1822 12/10/17 0513  WBC 7.8 7.7   Microbiology No results found for this or any previous visit (from the past 240 hour(s)).   Medications:   . aspirin EC  81 mg Oral Daily  . atorvastatin  10 mg Oral  Daily  . budesonide (PULMICORT) nebulizer solution  0.25 mg Nebulization BID  . chlorhexidine  15 mL Mouth Rinse BID  . dextromethorphan-guaiFENesin  1 tablet Oral BID  . doxycycline  100 mg Oral Q12H  . enoxaparin (LOVENOX) injection  40 mg Subcutaneous QHS  . insulin aspart  0-9 Units Subcutaneous TID WC  . insulin aspart  3 Units Subcutaneous TID WC  . insulin glargine  20 Units Subcutaneous Q2000  . ipratropium-albuterol  3 mL Nebulization Q4H  . mouth rinse  15 mL Mouth Rinse q12n4p  . methylPREDNISolone (SOLU-MEDROL) injection  40 mg Intravenous BID   Continuous Infusions:   LOS: 2 days   Charlynne Cousins  [facility] Pager [phone]  *Please refer to [email or web address], password [email or web address] to get updated schedule on who will round on this patient, as hospitalists switch teams weekly. If 7PM-7AM, please contact night-coverage at [email or web address], password [email or web address] for any overnight needs.  12/11/2017, 8:50 AM

## 2017-12-12 LAB — BASIC METABOLIC PANEL
Anion gap: 9 (ref 5–15)
BUN: 25 mg/dL — ABNORMAL HIGH (ref 6–20)
CO2: 29 mmol/L (ref 22–32)
Calcium: 8.6 mg/dL — ABNORMAL LOW (ref 8.9–10.3)
Chloride: 99 mmol/L — ABNORMAL LOW (ref 101–111)
Creatinine, Ser: 0.98 mg/dL (ref 0.61–1.24)
GFR calc Af Amer: >60 mL/min (ref >60)
GFR calc non Af Amer: >60 mL/min (ref >60)
Glucose, Bld: 295 mg/dL — ABNORMAL HIGH (ref 65–99)
Potassium: 4.5 mmol/L (ref 3.5–5.1)
Sodium: 137 mmol/L (ref 135–145)

## 2017-12-12 LAB — GLUCOSE, CAPILLARY
Glucose-Capillary: 267 mg/dL — ABNORMAL HIGH (ref 65–99)
Glucose-Capillary: 77 mg/dL (ref 65–99)

## 2017-12-12 MED ORDER — INSULIN GLARGINE 100 UNIT/ML ~~LOC~~ SOLN
15.0000 [IU] | Freq: Two times a day (BID) | SUBCUTANEOUS | Status: DC
  Administered 2017-12-12: 15 [IU] via SUBCUTANEOUS
  Filled 2017-12-12 (×2): qty 0.15

## 2017-12-12 MED ORDER — LISINOPRIL 20 MG PO TABS: 20.0000 mg | ORAL_TABLET | Freq: Every day | ORAL | 3 refills | Status: DC

## 2017-12-12 MED ORDER — DOXYCYCLINE HYCLATE 100 MG PO TABS: 100.0000 mg | ORAL_TABLET | Freq: Two times a day (BID) | ORAL | 0 refills | Status: DC

## 2017-12-12 MED ORDER — PREDNISONE 10 MG PO TABS: ORAL_TABLET | ORAL | 0 refills | Status: DC

## 2017-12-12 MED ORDER — GUAIFENESIN-DM 100-10 MG/5ML PO SYRP: 5.0000 mL | ORAL_SOLUTION | ORAL | 0 refills | Status: DC | PRN

## 2017-12-12 NOTE — Discharge Summary (Addendum)
Physician Discharge Summary  SYMON NORWOOD LDJ:[id number] DOB: 1941-07-18 DOA: 12/09/2017  PCP: Abner Greenspan, MD  Admit date: 12/09/2017 Discharge date: 12/12/2017  Admitted From: home Disposition:  Home  Recommendations for Outpatient Follow-up:  1. Follow up with PCP in 1-2 weeks 2. Please obtain BMP/CBC in one week. 3. Check blood pressure and titrate antihypertensive medications as needed.   Home Health:No Equipment/Devices:Home oxygen Discharge Condition:stable CODE STATUS:full Diet recommendation: Heart Healthy   Brief/Interim Summary: 77 y.o. male past medical history of chronic respiratory failures secondary to COPD on oxygen, diabetes mellitus type 2 that comes to the ED complaining of shortness of breath    Discharge Diagnoses:  Principal Problem:   COPD with acute exacerbation ([place]) Active Problems:   Essential hypertension   HLD (hyperlipidemia)   Type 2 diabetes mellitus with hyperglycemia, with long-term current use of insulin (HCC)   History of stroke   COPD exacerbation (HCC)   Acute on chronic respiratory failure with hypoxia (HCC)   AKI (acute kidney injury) ([place])  Acute respiratory failure with hypoxia due to COPD exacerbation: On admission he was started on IV steroids inhaler and antibiotics. Within 3 days his respiration is better, he was changed to oral antibiotics and steroids which he will continue for 5 additional days as an outpatient.  Pseudohyponatremia: Likely due to hyperglycemia resolved with correction of blood glucose.  Essential hypertension: Norvasc was DC'd as he is a diabetic and he was started on lisinopril 20 mg which she will continue as an outpatient follow-up with PCP and titrate as needed.  He might need a diuretic added as an outpatient if his blood pressure is not at goal.  Type 2 diabetes mellitus with hyperglycemia long-term insulin use: No changes were made to his insulin regimen. He was started on lisinopril which  she will continue to take as an outpatient.  Hyperlipidemia: Continue statins.  Acute kidney injury: Prerenal in the setting of hypovolemia this resolved with IV fluid hydration.  Discharge Instructions  Discharge Instructions    Diet - low sodium heart healthy   Complete by:  As directed    Increase activity slowly   Complete by:  As directed      Allergies as of 12/12/2017      Reactions   Amoxicillin-pot Clavulanate Other (See Comments)   Severe stomach pain.    Quinapril Hcl    REACTION: back pain   Valsartan    REACTION: back pain   Varenicline Tartrate    REACTION: AMS, hallucinations, night mares      Medication List    STOP taking these medications   amLODipine 5 MG tablet Commonly known as:  NORVASC     TAKE these medications   ACCU-CHEK AVIVA PLUS test strip Generic drug:  glucose blood USE TO CHECK BLOOD GLUCOSE LEVELS 3 TO 4TIMES DAILY   ACCU-CHEK AVIVA PLUS w/Device Kit USE AS DIRECTED   albuterol (2.5 MG/3ML) 0.083% nebulizer solution Commonly known as:  PROVENTIL Take 3 mLs (2.5 mg total) every 4 (four) hours as needed by nebulization for shortness of breath.   albuterol 108 (90 Base) MCG/ACT inhaler Commonly known as:  VENTOLIN HFA Inhale 2 puffs every 6 (six) hours as needed into the lungs for wheezing.   aspirin 81 MG tablet Take 1 tablet (81 mg total) by mouth daily.   atorvastatin 10 MG tablet Commonly known as:  LIPITOR Take 1 tablet (10 mg total) by mouth daily.   dextromethorphan-guaiFENesin 30-600 MG 12hr tablet Commonly  known as:  MUCINEX DM Take 1 tablet 2 (two) times daily by mouth. What changed:  Another medication with the same name was added. Make sure you understand how and when to take each.   guaiFENesin-dextromethorphan 100-10 MG/5ML syrup Commonly known as:  ROBITUSSIN DM Take 5 mLs by mouth every 4 (four) hours as needed for cough. What changed:  You were already taking a medication with the same name, and this  prescription was added. Make sure you understand how and when to take each.   doxycycline 100 MG tablet Commonly known as:  VIBRA-TABS Take 1 tablet (100 mg total) by mouth every 12 (twelve) hours.   fluticasone furoate-vilanterol 100-25 MCG/INH Aepb Commonly known as:  BREO ELLIPTA Inhale 1 puff daily into the lungs.   glipiZIDE 10 MG 24 hr tablet Commonly known as:  GLUCOTROL XL TAKE 1 TABLET IN THE  MORNING AND HALF A TABLET  BEFORE DINNER What changed:  See the new instructions.   GLOBAL EASE INJECT PEN NEEDLES 32G X 4 MM Misc Generic drug:  Insulin Pen Needle AS DIRECTED   insulin glargine 100 unit/mL Sopn Commonly known as:  LANTUS Inject 0.2 mLs (20 Units total) into the skin daily at 8 pm.   insulin lispro 100 UNIT/ML KiwkPen Commonly known as:  HUMALOG Inject 0.04-0.06 mLs (4-6 Units total) 3 (three) times daily into the skin.   lisinopril 20 MG tablet Commonly known as:  PRINIVIL,ZESTRIL Take 1 tablet (20 mg total) by mouth daily.   metFORMIN 1000 MG tablet Commonly known as:  GLUCOPHAGE Take 1,085m by mouth twice a day   predniSONE 10 MG tablet Commonly known as:  DELTASONE Takes  4 tablets for 1 days, then 3 tablets for 1 days, then 2 tabs for 1 days, then 1 tab for 1 days, and then stop.       Allergies  Allergen Reactions  . Amoxicillin-Pot Clavulanate Other (See Comments)    Severe stomach pain.   . Quinapril Hcl     REACTION: back pain  . Valsartan     REACTION: back pain  . Varenicline Tartrate     REACTION: AMS, hallucinations, night mares    Consultations:  None   Procedures/Studies: Dg Chest 2 View  Result Date: 12/09/2017 CLINICAL DATA:  77year old male with shortness of breath. EXAM: CHEST - 2 VIEW COMPARISON:  Chest radiograph dated 08/04/2017 FINDINGS: There is emphysematous changes of the lungs. Areas of streaky density primarily in the right infrahilar and right lung base may be related to chronic interstitial coarsening and  atelectatic changes. Developing infiltrate is not excluded. Clinical correlation is recommended. No focal consolidation, pleural effusion, or pneumothorax. Stable mild cardiomegaly. There is atherosclerotic calcification of the aortic arch. No acute osseous pathology. IMPRESSION: 1. Right lung base interstitial coarsening and atelectatic changes versus developing infiltrate. No focal consolidation. 2. Emphysema. 3. Mild cardiomegaly. Electronically Signed   By: AAnner CreteM.D.   On: 12/09/2017 19:25   Ct Angio Chest Pe W Or Wo Contrast  Result Date: 12/10/2017 CLINICAL DATA:  Acute onset of shortness of breath and wheezing. EXAM: CT ANGIOGRAPHY CHEST WITH CONTRAST TECHNIQUE: Multidetector CT imaging of the chest was performed using the standard protocol during bolus administration of intravenous contrast. Multiplanar CT image reconstructions and MIPs were obtained to evaluate the vascular anatomy. CONTRAST:  1029mISOVUE-370 IOPAMIDOL (ISOVUE-370) INJECTION 76% COMPARISON:  Chest radiograph performed 12/09/2017, and CTA of the chest performed 11/28/2013 FINDINGS: Cardiovascular:  There is no evidence of pulmonary  embolus. The heart is normal in size. Diffuse coronary artery calcifications are seen. Mild calcification is noted at the aortic arch and proximal great vessels. Mediastinum/Nodes: No mediastinal lymphadenopathy is seen. No pericardial effusion is identified. The visualized portions of the thyroid gland are unremarkable. No axillary lymphadenopathy is seen. Lungs/Pleura: Bilateral emphysema is noted. No pleural effusion or pneumothorax is seen. No masses are identified. Upper Abdomen: The visualized portions of the liver and spleen are unremarkable. The visualized portions of the gallbladder, pancreas, adrenal glands and kidneys are within normal limits. Musculoskeletal: No acute osseous abnormalities are identified. The visualized musculature is unremarkable in appearance. Review of the MIP  images confirms the above findings. IMPRESSION: 1. No evidence of pulmonary embolus. 2. Bilateral emphysema.  Lungs otherwise clear. 3. Diffuse coronary artery calcifications. Electronically Signed   By: Garald Balding M.D.   On: 12/10/2017 03:45    Subjective: No complaints.  Discharge Exam: Vitals:   12/12/17 0416 12/12/17 0608  BP: 138/85   Pulse:    Resp: 20   Temp: 98 F (36.7 C)   SpO2: 96% 94%   Vitals:   12/11/17 2122 12/12/17 0154 12/12/17 0416 12/12/17 0608  BP: (!) 95/52  138/85   Pulse:      Resp: 19  20   Temp: 97.8 F (36.6 C)  98 F (36.7 C)   TempSrc: Oral  Oral   SpO2: 97% 96% 96% 94%  Weight:      Height:        General: Pt is alert, awake, not in acute distress Cardiovascular: RRR, S1/S2 +, no rubs, no gallops Respiratory: Good air movement and clear to auscultation. Abdominal: Soft, NT, ND, bowel sounds + Extremities: no edema, no cyanosis    The results of significant diagnostics from this hospitalization (including imaging, microbiology, ancillary and laboratory) are listed below for reference.     Microbiology: No results found for this or any previous visit (from the past 240 hour(s)).   Labs: BNP (last 3 results) Recent Labs    12/10/17 0514  BNP 00.7   Basic Metabolic Panel: Recent Labs  Lab 12/09/17 1822 12/10/17 0513 12/11/17 0447 12/12/17 0519  NA 138 134* 139 137  K 4.4 4.8 4.6 4.5  CL 102 94* 101 99*  CO2 _0 GLUCOSE 253* 469* 338* 295*  BUN 11 20 22* 25*  CREATININE 0.84 1.41*  1.36* 1.08 0.98  CALCIUM 7.8* 9.0 8.6* 8.6*   Liver Function Tests: No results for input(s): AST, ALT, ALKPHOS, BILITOT, PROT, ALBUMIN in the last 168 hours. No results for input(s): LIPASE, AMYLASE in the last 168 hours. No results for input(s): AMMONIA in the last 168 hours. CBC: Recent Labs  Lab 12/09/17 1822 12/10/17 0513  WBC 7.8 7.7  NEUTROABS 5.4  --   HGB 15.5 16.1  HCT 47.0 49.4  MCV 87.9 88.1  PLT 225 249    Cardiac Enzymes: Recent Labs  Lab 12/10/17 0513  TROPONINI 0.04*   BNP: Invalid input(s): POCBNP CBG: Recent Labs  Lab 12/11/17 1138 12/11/17 1627 12/11/17 2126 12/12/17 0728 12/12/17 1140  GLUCAP 224* 224* 188* 267* 77   D-Dimer No results for input(s): DDIMER in the last 72 hours. Hgb A1c No results for input(s): HGBA1C in the last 72 hours. Lipid Profile No results for input(s): CHOL, HDL, LDLCALC, TRIG, CHOLHDL, LDLDIRECT in the last 72 hours. Thyroid function studies No results for input(s): TSH, T4TOTAL, T3FREE, THYROIDAB in the last 72 hours.  Invalid  input(s): FREET3 Anemia work up No results for input(s): VITAMINB12, FOLATE, FERRITIN, TIBC, IRON, RETICCTPCT in the last 72 hours. Urinalysis    Component Value Date/Time   COLORURINE YELLOW 12/14/2013 1652   APPEARANCEUR CLEAR 12/14/2013 1652   LABSPEC 1.024 12/14/2013 1652   PHURINE 6.5 12/14/2013 1652   GLUCOSEU NEGATIVE 12/14/2013 1652   HGBUR NEGATIVE 12/14/2013 1652   HGBUR large 08/30/2007 1447   BILIRUBINUR neg. 02/21/2014 1503   KETONESUR NEGATIVE 12/14/2013 1652   PROTEINUR Trace 02/21/2014 1503   PROTEINUR NEGATIVE 12/14/2013 1652   UROBILINOGEN 0.2 02/21/2014 1503   UROBILINOGEN 1.0 12/14/2013 1652   NITRITE neg. 02/21/2014 1503   NITRITE NEGATIVE 12/14/2013 1652   LEUKOCYTESUR Trace 02/21/2014 1503   Sepsis Labs Invalid input(s): PROCALCITONIN,  WBC,  LACTICIDVEN Microbiology No results found for this or any previous visit (from the past 240 hour(s)).   Time coordinating discharge: Over 30 minutes  SIGNED:   Charlynne Cousins, MD  [facility] 12/12/2017, 12:29 PM Pager   If 7PM-7AM, please contact night-coverage [email or web address] Password [email or web address]

## 2017-12-15 ENCOUNTER — Telehealth: Payer: Self-pay

## 2017-12-15 NOTE — Telephone Encounter (Signed)
Attempted to contact patient to complete TCM and schedule hospital f/u, no answer.  

## 2018-01-14 ENCOUNTER — Ambulatory Visit (INDEPENDENT_AMBULATORY_CARE_PROVIDER_SITE_OTHER): Payer: Medicare Other | Admitting: Internal Medicine

## 2018-01-14 ENCOUNTER — Encounter: Payer: Self-pay | Admitting: Internal Medicine

## 2018-01-14 VITALS — BP 122/74 | HR 98 | Ht 67.0 in | Wt 177.8 lb

## 2018-01-14 DIAGNOSIS — Z794 Long term (current) use of insulin: Secondary | ICD-10-CM | POA: Diagnosis not present

## 2018-01-14 DIAGNOSIS — E1165 Type 2 diabetes mellitus with hyperglycemia: Secondary | ICD-10-CM | POA: Diagnosis not present

## 2018-01-14 DIAGNOSIS — E785 Hyperlipidemia, unspecified: Secondary | ICD-10-CM | POA: Diagnosis not present

## 2018-01-14 LAB — LIPID PANEL
Cholesterol: 133 mg/dL (ref 0–200)
HDL: 38.9 mg/dL — ABNORMAL LOW (ref >39.00)
NonHDL: 93.63
Total CHOL/HDL Ratio: 3
Triglycerides: 388 mg/dL — ABNORMAL HIGH (ref 0.0–149.0)
VLDL: 77.6 mg/dL — ABNORMAL HIGH (ref 0.0–40.0)

## 2018-01-14 LAB — LDL CHOLESTEROL, DIRECT: Direct LDL: 70 mg/dL

## 2018-01-14 MED ORDER — INSULIN LISPRO 100 UNIT/ML (KWIKPEN): 8.0000 [IU] | PEN_INJECTOR | Freq: Three times a day (TID) | SUBCUTANEOUS | 11 refills | Status: DC

## 2018-01-14 MED ORDER — METFORMIN HCL 1000 MG PO TABS: ORAL_TABLET | ORAL | 3 refills | Status: DC

## 2018-01-14 NOTE — Progress Notes (Signed)
Patient ID: Jonathan [name], male   DOB: [birth year], 77 y.o.   MRN: [id number]  Jonathan [name] is a 77 y.o.-year-old male, returning for follow up management of DM2, dx 1990, uncontrolled, insulin-dep since ~1017, without complications (h/o stroke, also mild CKD). Last visit 3 mo ago.  He is here with his wife who offers part of the hx, especially related to his recent hospitalization and also medications.  He was admitted with COPD exacerbation + flu 11/2017. He was on steroids >> sugars 300s.  Last hemoglobin A1c was: 10/16/2017: HbA1c calculated from the fructosamine is better, at 7.18%. 08/14/2017: HbA1c calculated from the fructosamine is 7.5% 05/14/2017: HbA1c calculated from the fructosamine is 7.4% 01/27/2017: HbA1c calculated from the fructosamine is great: 6.2% 10/30/2016: HbA1c calculated from the fructosamine is great, at 6.5% 07/31/2016: HbA1c calculated from the fructosamine is great, at 6.15% Lab Results  Component Value Date   HGBA1C 11 08/14/2017   HGBA1C 7.9 07/31/2016   HGBA1C 7.9 04/29/2016   He is on: - Glipizide XL 10 mg in am - Metformin 1000 mg 2x a day  - Lantus 20 units in pm >> taking sporadically - Humalog  4-6 units before meals - added with every meal 09/2017, but still taking it sporadically  Checks sugars 3x a day: - am:   129-268 >> 143-197, 205, 247, 271 (forgets insulin) >> 89-203, 248 - 2h after b'fast: n/c  - lunch: 92-252, 340 >> 157-296 >> 113-209 >> 196-288 - 2h after lunch: n/c - dinner:  81-128 >> 88-133, 158 >> n/c >> 123-220, 270 >> 171-297 - 2h after dinner: 139-280, 373 >> 173-284 >> n/c - bedtime: n/c Highest  273 >> 293. Lowest  113 >> 89.  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  Last ACR normal: Lab Results   Component Value Date   MICRALBCREAT 7.6 09/15/2016   MICRALBCREAT 12.7 04/28/2014   MICRALBCREAT 25.3 12/17/2009   MICRALBCREAT 22.6 02/13/2009   -+ HL: Lab Results  Component Value Date   CHOL 181 11/25/2016   HDL 41.70 11/25/2016   LDLCALC 58 09/01/2014   LDLDIRECT 106.0 11/25/2016   TRIG 276.0 (H) 11/25/2016   CHOLHDL 4 11/25/2016  On Lipitor. On ASA 81. - Last eye exam was in 2015: No DR - no numbness and tingling in feet.  Still smokes 1 PPD.   He is too short of breath for any type of activity, he tells me. He is unstable on his feet because of back pain. Around early 2015, he was hospitalized after syncope, was in ICU, was on assisted ventilation, expressive aphasia recovered- believed to have a stroke, now better.    His wife has stage 3 lung cancer >> ChTx.  ROS: Constitutional: no weight gain/no weight loss, + fatigue, no subjective hyperthermia, no subjective hypothermia, + nocturia Eyes: no blurry vision, no xerophthalmia ENT: no sore throat, no nodules palpated in throat, no dysphagia, no odynophagia, no hoarseness Cardiovascular: no CP/+ SOB/no palpitations/no leg swelling Respiratory: + cough/+ SOB/+ wheezing Gastrointestinal: no N/no V/no D/no C/no acid reflux Musculoskeletal: + muscle aches/+ joint aches Skin: no rashes, no hair loss Neurological: no tremors/no numbness/no tingling/no dizziness + diff. With erections  I reviewed pt's medications, allergies, PMH, social hx, family hx, and changes  were documented in the history of present illness. Otherwise, unchanged from my initial visit note.  Past Medical History:  Diagnosis Date  . Allergic rhinitis   . Asthma   . COPD (chronic obstructive pulmonary disease) ([place])   . Dermatitis seborrheica   . Diabetes mellitus type II   . ED (erectile dysfunction)   . Frozen shoulder   . HLD (hyperlipidemia)   . HTN (hypertension)   . Hyperkalemia   . Kidney stone   . Other diseases of lung, not elsewhere  classified   . Pulmonary nodule, right    lower lobe  . Rotator cuff injury    right  . Shoulder pain   . Stroke [facility])    pt states "last year"  . Tobacco abuse    Past Surgical History:  Procedure Laterality Date  . CERVICAL DISCECTOMY  1998  . EEG    . ETT  11/1994  . TEE WITHOUT CARDIOVERSION  12/03/2011   Procedure: TRANSESOPHAGEAL ECHOCARDIOGRAM (TEE);  Surgeon: Lelon Perla, MD;  Location: [facility] ENDOSCOPY;  Service: Cardiovascular;  Laterality: N/A;  . TM repair     Social History   Socioeconomic History  . Marital status: Married    Spouse name: Not on file  . Number of children: Not on file  . Years of education: Not on file  . Highest education level: Not on file  Occupational History  . Occupation: Retired    Comment: Air cabin crew  Social Needs  . Financial resource strain: Not on file  . Food insecurity:    Worry: Not on file    Inability: Not on file  . Transportation needs:    Medical: Not on file    Non-medical: Not on file  Tobacco Use  . Smoking status: Current Every Day Smoker    Packs/day: 1.00    Types: Cigarettes  . Smokeless tobacco: Never Used  Substance and Sexual Activity  . Alcohol use: No    Alcohol/week: 0.0 oz    Comment: Quit 40 years ago  . Drug use: No  . Sexual activity: Never    Partners: Female  Lifestyle  . Physical activity:    Days per week: Not on file    Minutes per session: Not on file  . Stress: Not on file  Relationships  . Social connections:    Talks on phone: Not on file    Gets together: Not on file    Attends religious service: Not on file    Active member of club or organization: Not on file    Attends meetings of clubs or organizations: Not on file    Relationship status: Not on file  . Intimate partner violence:    Fear of current or ex partner: Not on file    Emotionally abused: Not on file    Physically abused: Not on file    Forced sexual activity: Not on file  Other Topics Concern  . Not on  file  Social History Narrative  . Not on file   Current Outpatient Medications on File Prior to Visit  Medication Sig Dispense Refill  . ACCU-CHEK AVIVA PLUS test strip USE TO CHECK BLOOD GLUCOSE LEVELS 3 TO 4TIMES DAILY 200 each 2  . albuterol (PROVENTIL) (2.5 MG/3ML) 0.083% nebulizer solution Take 3 mLs (2.5 mg total) every 4 (four) hours as needed by nebulization for shortness of breath. (Patient not taking: Reported on 12/09/2017) 75 mL 0  . albuterol (VENTOLIN HFA) 108 (90 Base) MCG/ACT inhaler  Inhale 2 puffs every 6 (six) hours as needed into the lungs for wheezing. 1 Inhaler 0  . aspirin 81 MG tablet Take 1 tablet (81 mg total) by mouth daily. 30 tablet 0  . atorvastatin (LIPITOR) 10 MG tablet Take 1 tablet (10 mg total) by mouth daily. 90 tablet 1  . Blood Glucose Monitoring Suppl (ACCU-CHEK AVIVA PLUS) W/DEVICE KIT USE AS DIRECTED 1 kit 0  . dextromethorphan-guaiFENesin (MUCINEX DM) 30-600 MG 12hr tablet Take 1 tablet 2 (two) times daily by mouth. 30 tablet 0  . doxycycline (VIBRA-TABS) 100 MG tablet Take 1 tablet (100 mg total) by mouth every 12 (twelve) hours. 8 tablet 0  . fluticasone furoate-vilanterol (BREO ELLIPTA) 100-25 MCG/INH AEPB Inhale 1 puff daily into the lungs. 60 each 0  . glipiZIDE (GLUCOTROL XL) 10 MG 24 hr tablet TAKE 1 TABLET IN THE  MORNING AND HALF A TABLET  BEFORE DINNER (Patient taking differently: TAKE 1 TABLET IN THE  MORNING) 135 tablet 2  . GLOBAL EASE INJECT PEN NEEDLES 32G X 4 MM MISC AS DIRECTED 100 each 2  . guaiFENesin-dextromethorphan (ROBITUSSIN DM) 100-10 MG/5ML syrup Take 5 mLs by mouth every 4 (four) hours as needed for cough. 118 mL 0  . insulin glargine (LANTUS) 100 unit/mL SOPN Inject 0.2 mLs (20 Units total) into the skin daily at 8 pm. 15 mL 2  . insulin lispro (HUMALOG) 100 UNIT/ML KiwkPen Inject 0.04-0.06 mLs (4-6 Units total) 3 (three) times daily into the skin. 15 mL 11  . lisinopril (PRINIVIL,ZESTRIL) 20 MG tablet Take 1 tablet (20 mg total)  by mouth daily. 30 tablet 3  . metFORMIN (GLUCOPHAGE) 1000 MG tablet Take 1,087m by mouth twice a day 180 tablet 1  . predniSONE (DELTASONE) 10 MG tablet Takes  4 tablets for 1 days, then 3 tablets for 1 days, then 2 tabs for 1 days, then 1 tab for 1 days, and then stop. 10 tablet 0   No current facility-administered medications on file prior to visit.    Allergies  Allergen Reactions  . Amoxicillin-Pot Clavulanate Other (See Comments)    Severe stomach pain.   . Quinapril Hcl     REACTION: back pain  . Valsartan     REACTION: back pain  . Varenicline Tartrate     REACTION: AMS, hallucinations, night mares   Family History  Problem Relation Age of Onset  . Asthma Sister   . Emphysema Sister   . Diabetes Mother   . Diabetes Brother   . Heart disease Brother    PE: BP 122/74   Pulse 98   Ht 5' 7"  (1.702 m)   Wt 177 lb 12.8 oz (80.6 kg)   SpO2 93%   BMI 27.85 kg/m  Body mass index is 27.85 kg/m. Wt Readings from Last 3 Encounters:  01/14/18 177 lb 12.8 oz (80.6 kg)  12/10/17 175 lb 11.3 oz (79.7 kg)  10/16/17 177 lb 6.4 oz (80.5 kg)   Constitutional: overweight, in NAD, in wheelchair Eyes: PERRLA, EOMI, no exophthalmos ENT: moist mucous membranes, no thyromegaly, no cervical lymphadenopathy Cardiovascular: tachycardia, RR, No MRG Respiratory: CTA B Gastrointestinal: abdomen soft, NT, ND, BS+ Musculoskeletal: no deformities, strength intact in all 4 Skin: moist, warm, no rashes Neurological: no tremor with outstretched hands, DTR normal in all 4  ASSESSMENT: 1. DM2, insulin-dependent, uncontrolled, with complications - Cerebrovascular disease-h/o stroke  2. HL  PLAN:  1. Patient with long-standing, uncontrolled, diabetes, on basal-bolus insulin regimen of metformin and glipizide.  After his pneumonia episode at the end of last year, I advised him to start mealtime insulin, but he did not do so, so at last visit sugars were still high.  I again advised him to add 4  to 6 units of Humalog before each meal. - at this visit, he tells me his sugars greatly increased while in the hospital, and on prednisone, and they are still not improved too much.  His sugars are better in the morning but they are quite high later in the day.  Is frequently missing his Lantus and also his Humalog.  We discussed that Lantus is not a prn medication and he needs to take it every day for effect. Also, he absolutely needs his Humalog with every meal and in fact I will increase the dose.  If he does take it before meals, we can stop the glipizide after he runs out. - I suggested to:  Patient Instructions  Please continue: - Lantus 20 units before dinner - Metformin 1000 mg 2x a day   Try to stop Glipizide.  Increase Humalog 3x a day, 10-15 min before meals: - 8 units before a smaller meal - 10 units before a regular meal - 12-14 units before a large meal  Please return in 3 months with your sugar log.   - today will check a fructosamine - continue checking sugars at different times of the day - check 1x a day, rotating checks - advised for yearly eye exams >> he is not UTD - Return to clinic in 3 mo with sugar log     2. HL - Reviewed latest lipid panel (10/2016): LDL increased, TG also high - Continues the statin without side effects. - he is due for another lipid panel - will check today  Office Visit on 01/14/2018  Component Date Value Ref Range Status  . Fructosamine 01/14/2018 298* 190 - 270 umol/L Final  . Cholesterol 01/14/2018 133  0 - 200 mg/dL Final   ATP III Classification       Desirable:  < 200 mg/dL               Borderline High:  200 - 239 mg/dL          High:  > = 240 mg/dL  . Triglycerides 01/14/2018 388.0* 0.0 - 149.0 mg/dL Final   Normal:  <150 mg/dLBorderline High:  150 - 199 mg/dL  . HDL 01/14/2018 38.90* >39.00 mg/dL Final  . VLDL 01/14/2018 77.6* 0.0 - 40.0 mg/dL Final  . Total CHOL/HDL Ratio 01/14/2018 3   Final                  Men           Women1/2 Average Risk     3.4          3.3Average Risk          5.0          4.42X Average Risk          9.6          7.13X Average Risk          15.0          11.0                      . NonHDL 01/14/2018 93.63   Final   NOTE:  Non-HDL goal should be 30 mg/dL higher than patient's LDL goal (i.e. LDL goal of <  70 mg/dL, would have non-HDL goal of < 100 mg/dL)  . Direct LDL 01/14/2018 70.0  mg/dL Final   Optimal:  <100 mg/dLNear or Above Optimal:  100-129 mg/dLBorderline High:  130-159 mg/dLHigh:  160-189 mg/dLVery High:  >190 mg/dL    01/14/2018: HbA1c calculated from the fructosamine is better, at 6.7%. TG higher, LDL at goal. Continue Lipitor. Needs to improve diet.  Philemon Kingdom, MD PhD [facility] Endocrinology

## 2018-01-14 NOTE — Patient Instructions (Addendum)
Please continue: - Lantus 20 units before dinner - Metformin 1000 mg 2x a day   Try to stop Glipizide.  Increase Humalog 3x a day, 10-15 min before meals: - 8 units before a smaller meal - 10 units before a regular meal - 12-14 units before a large meal  Please return in 3 months with your sugar log.

## 2018-01-18 LAB — FRUCTOSAMINE: Fructosamine: 298 umol/L — ABNORMAL HIGH (ref 190–270)

## 2018-01-19 ENCOUNTER — Telehealth: Payer: Self-pay

## 2018-01-19 NOTE — Telephone Encounter (Signed)
-----   Message from Philemon Kingdom, MD sent at 01/18/2018  5:05 PM EDT ----- Larey Seat, can you please call pt:  HbA1c calculated from the fructosamine is better, at 6.7%. Triglycerides are higher, LDL at goal. Continue Lipitor and work towards decreasing fatty foods and concentrated sweets.

## 2018-01-19 NOTE — Telephone Encounter (Signed)
Called patient. Gave results. Patient verbalized understanding.    

## 2018-04-23 ENCOUNTER — Encounter: Payer: Self-pay | Admitting: Internal Medicine

## 2018-04-23 ENCOUNTER — Ambulatory Visit (INDEPENDENT_AMBULATORY_CARE_PROVIDER_SITE_OTHER): Payer: Medicare Other | Admitting: Internal Medicine

## 2018-04-23 VITALS — BP 120/70 | HR 90 | Ht 67.0 in | Wt 173.2 lb

## 2018-04-23 DIAGNOSIS — E1165 Type 2 diabetes mellitus with hyperglycemia: Secondary | ICD-10-CM

## 2018-04-23 DIAGNOSIS — E785 Hyperlipidemia, unspecified: Secondary | ICD-10-CM

## 2018-04-23 DIAGNOSIS — Z794 Long term (current) use of insulin: Secondary | ICD-10-CM

## 2018-04-23 NOTE — Progress Notes (Signed)
Patient ID: Jonathan [name], male   DOB: 04/02/1941, 77 y.o.   MRN: [id number]  Jonathan [name] is a 77 y.o.-year-old male, returning for follow up management of DM2, dx 1990, uncontrolled, insulin-dep since ~3664, without complications (h/o stroke, also mild CKD). Last visit 3 mo ago.  As always, he is here with his wife who offers part of the history, especially related to his CBG values, activity, and insulin doses.  Last hemoglobin A1c was: 01/14/2018: HbA1c calculated from the fructosamine is better, at 6.7%. 10/16/2017: HbA1c calculated from the fructosamine is better, at 7.18%. 08/14/2017: HbA1c calculated from the fructosamine is 7.5% 05/14/2017: HbA1c calculated from the fructosamine is 7.4% 01/27/2017: HbA1c calculated from the fructosamine is great: 6.2% 10/30/2016: HbA1c calculated from the fructosamine is great, at 6.5% 07/31/2016: HbA1c calculated from the fructosamine is great, at 6.15% Lab Results  Component Value Date   HGBA1C 11 08/14/2017   HGBA1C 7.9 07/31/2016   HGBA1C 7.9 04/29/2016   He is on: - Lantus 20 units in the evening - still takes this 1-2x a week - Metformin 1000 mg 2x a day  - Humalog 3x a day, 10-15 min before meals: -Still takes this only when sugars are high - 8 units before a smaller meal - 10 units before a regular meal - 12-14 units before a large meal  Checks sugars 1-3X a day per review of his log : - am:143-197, 205, 247, 271 (forgets insulin) >> 89-203, 248 >> 170-260 - 2h after b'fast: n/c  - lunch:  157-296 >> 113-209 >> 196-288 >> 146-261, 348 - 2h after lunch: n/c - dinner:   n/c >> 123-220, 270 >> 171-297 >> 187-251 - 2h after dinner: 139-280, 373 >> 173-284 >> n/c - bedtime: n/c Highest  273 >> 293 >> 348 Lowest  113 >> 89 >> 146  + stage 2-3 CKD, last BUN/creatinine: Lab Results  Component Value Date   BUN 25 (H) 12/12/2017   CREATININE 0.98 12/12/2017   Lab Results  Component Value Date   GFR 74.54 11/25/2016   MICRALBCREAT 7.6 09/15/2016  He is allergic to ACE inhibitors and ARB's.  Last ACR normal: Lab Results  Component Value Date   MICRALBCREAT 7.6 09/15/2016   MICRALBCREAT 12.7 04/28/2014   MICRALBCREAT 25.3 12/17/2009   MICRALBCREAT 22.6 02/13/2009   -+ HL: Lab Results  Component Value Date   CHOL 133 01/14/2018   HDL 38.90 (L) 01/14/2018   LDLCALC 58 09/01/2014   LDLDIRECT 70.0 01/14/2018   TRIG 388.0 (H) 01/14/2018   CHOLHDL 3 01/14/2018  On Lipitor. On ASA 81. - Last eye exam was in 03/2018: No DR. + cataract - no numbness and tingling in feet.  He still smokes 1 PPD. He is too short of breath for any type of activity, he tells me. He is unstable on his feet because of back pain. Around early 2015, he was hospitalized after syncope, was in ICU, was on assisted ventilation, expressive aphasia recovered- believed to have a stroke, now better.    His wife has stage 3 lung cancer >> ChTx.  ROS: Constitutional: no weight gain/no weight loss, no fatigue, no subjective hyperthermia, no subjective hypothermia Eyes: no blurry vision, no xerophthalmia ENT: no sore throat, no nodules palpated in throat, no dysphagia, no odynophagia, no hoarseness Cardiovascular: no CP/no SOB/no palpitations/no leg swelling Respiratory: no cough/no SOB/no wheezing Gastrointestinal: no N/no V/no D/no C/no acid reflux Musculoskeletal: no muscle aches/no joint aches Skin: no rashes, no hair loss Neurological:  no tremors/no numbness/no tingling/no dizziness  I reviewed pt's medications, allergies, PMH, social hx, family hx, and changes were documented in the history of present illness. Otherwise, unchanged from my initial visit note.  Past Medical History:  Diagnosis Date  . Allergic rhinitis   . Asthma   . COPD (chronic obstructive pulmonary disease) ([place])   . Dermatitis seborrheica   . Diabetes mellitus type II   . ED (erectile dysfunction)   . Frozen shoulder   . HLD (hyperlipidemia)   .  HTN (hypertension)   . Hyperkalemia   . Kidney stone   . Other diseases of lung, not elsewhere classified   . Pulmonary nodule, right    lower lobe  . Rotator cuff injury    right  . Shoulder pain   . Stroke [facility])    pt states "last year"  . Tobacco abuse    Past Surgical History:  Procedure Laterality Date  . CERVICAL DISCECTOMY  1998  . EEG    . ETT  11/1994  . TEE WITHOUT CARDIOVERSION  12/03/2011   Procedure: TRANSESOPHAGEAL ECHOCARDIOGRAM (TEE);  Surgeon: Lelon Perla, MD;  Location: [facility] ENDOSCOPY;  Service: Cardiovascular;  Laterality: N/A;  . TM repair     Social History   Socioeconomic History  . Marital status: Married    Spouse name: Not on file  . Number of children: Not on file  . Years of education: Not on file  . Highest education level: Not on file  Occupational History  . Occupation: Retired    Comment: Air cabin crew  Social Needs  . Financial resource strain: Not on file  . Food insecurity:    Worry: Not on file    Inability: Not on file  . Transportation needs:    Medical: Not on file    Non-medical: Not on file  Tobacco Use  . Smoking status: Current Every Day Smoker    Packs/day: 1.00    Types: Cigarettes  . Smokeless tobacco: Never Used  Substance and Sexual Activity  . Alcohol use: No    Alcohol/week: 0.0 oz    Comment: Quit 40 years ago  . Drug use: No  . Sexual activity: Never    Partners: Female  Lifestyle  . Physical activity:    Days per week: Not on file    Minutes per session: Not on file  . Stress: Not on file  Relationships  . Social connections:    Talks on phone: Not on file    Gets together: Not on file    Attends religious service: Not on file    Active member of club or organization: Not on file    Attends meetings of clubs or organizations: Not on file    Relationship status: Not on file  . Intimate partner violence:    Fear of current or ex partner: Not on file    Emotionally abused: Not on file    Physically  abused: Not on file    Forced sexual activity: Not on file  Other Topics Concern  . Not on file  Social History Narrative  . Not on file   Current Outpatient Medications on File Prior to Visit  Medication Sig Dispense Refill  . ACCU-CHEK AVIVA PLUS test strip USE TO CHECK BLOOD GLUCOSE LEVELS 3 TO 4TIMES DAILY 200 each 2  . albuterol (PROVENTIL) (2.5 MG/3ML) 0.083% nebulizer solution Take 3 mLs (2.5 mg total) every 4 (four) hours as needed by nebulization for shortness of breath. 75  mL 0  . albuterol (VENTOLIN HFA) 108 (90 Base) MCG/ACT inhaler Inhale 2 puffs every 6 (six) hours as needed into the lungs for wheezing. 1 Inhaler 0  . aspirin 81 MG tablet Take 1 tablet (81 mg total) by mouth daily. 30 tablet 0  . atorvastatin (LIPITOR) 10 MG tablet Take 1 tablet (10 mg total) by mouth daily. 90 tablet 1  . Blood Glucose Monitoring Suppl (ACCU-CHEK AVIVA PLUS) W/DEVICE KIT USE AS DIRECTED 1 kit 0  . dextromethorphan-guaiFENesin (MUCINEX DM) 30-600 MG 12hr tablet Take 1 tablet 2 (two) times daily by mouth. 30 tablet 0  . fluticasone furoate-vilanterol (BREO ELLIPTA) 100-25 MCG/INH AEPB Inhale 1 puff daily into the lungs. 60 each 0  . glipiZIDE (GLUCOTROL XL) 10 MG 24 hr tablet TAKE 1 TABLET IN THE  MORNING AND HALF A TABLET  BEFORE DINNER (Patient taking differently: TAKE 1 TABLET IN THE  MORNING) 135 tablet 2  . GLOBAL EASE INJECT PEN NEEDLES 32G X 4 MM MISC AS DIRECTED 100 each 2  . guaiFENesin-dextromethorphan (ROBITUSSIN DM) 100-10 MG/5ML syrup Take 5 mLs by mouth every 4 (four) hours as needed for cough. 118 mL 0  . insulin glargine (LANTUS) 100 unit/mL SOPN Inject 0.2 mLs (20 Units total) into the skin daily at 8 pm. 15 mL 2  . insulin lispro (HUMALOG) 100 UNIT/ML KiwkPen Inject 0.08-0.14 mLs (8-14 Units total) into the skin 3 (three) times daily. 15 mL 11  . lisinopril (PRINIVIL,ZESTRIL) 20 MG tablet Take 1 tablet (20 mg total) by mouth daily. 30 tablet 3  . metFORMIN (GLUCOPHAGE) 1000 MG  tablet Take 1,034m by mouth twice a day 180 tablet 3  . predniSONE (DELTASONE) 10 MG tablet Takes  4 tablets for 1 days, then 3 tablets for 1 days, then 2 tabs for 1 days, then 1 tab for 1 days, and then stop. 10 tablet 0   No current facility-administered medications on file prior to visit.    Allergies  Allergen Reactions  . Amoxicillin-Pot Clavulanate Other (See Comments)    Severe stomach pain.   . Quinapril Hcl     REACTION: back pain  . Valsartan     REACTION: back pain  . Varenicline Tartrate     REACTION: AMS, hallucinations, night mares   Family History  Problem Relation Age of Onset  . Asthma Sister   . Emphysema Sister   . Diabetes Mother   . Diabetes Brother   . Heart disease Brother    PE: There were no vitals taken for this visit. There is no height or weight on file to calculate BMI. Wt Readings from Last 3 Encounters:  01/14/18 177 lb 12.8 oz (80.6 kg)  12/10/17 175 lb 11.3 oz (79.7 kg)  10/16/17 177 lb 6.4 oz (80.5 kg)   Constitutional: overweight, in NAD, in wheelchair Eyes: PERRLA, EOMI, no exophthalmos ENT: moist mucous membranes, no thyromegaly, no cervical lymphadenopathy Cardiovascular: RRR, No MRG Respiratory: CTA B Gastrointestinal: abdomen soft, NT, ND, BS+ Musculoskeletal: no deformities, strength intact in all 4 Skin: moist, warm, no rashes Neurological: no tremor with outstretched hands, DTR normal in all 4  ASSESSMENT: 1. DM2, insulin-dependent, uncontrolled, with complications - Cerebrovascular disease-h/o stroke  2. HL  PLAN:  1. Patient with long-standing, uncontrolled, type of diabetes, on basal-bolus insulin regimen and also metformin.  We stopped glipizide at last visit.  At that time, sugars were slightly better, but he was taking Lantus sporadically and also was missing many doses of Humalog.  We discussed that these are not PRN meds take them every day. - At this visit, patient did not make any changes in his regimen.  He is  still not taking Lantus but maybe once a week and also takes Humalog only when sugars are high.  We again went over the rationale of taking Lantus daily and taking Humalog before meals.  I also explained to him that if he does not make an effort to start taking the regimen as prescribed, I cannot help him. - At the end of the visit, wife is telling me that patient is actually not getting up from the couch and not even showering.  She is concerned about him. - I suggested to:  Patient Instructions  Please continue: - Metformin 1000 mg 2x a day   Please start taking consistently: - Lantus 20 units in the evening - Humalog 3x a day, 10-15 min before meals: - 8 units before a smaller meal - 10 units before a regular meal - 12-14 units before a large meal  Please return in 3-4 months with your sugar log.   - today will check a fructosamine level  - continue checking sugars at different times of the day - check 3x a day, rotating checks - advised for yearly eye exams >> he is UTD - Return to clinic in 3-4 mo with sugar log     2. HL - Reviewed latest lipid panel from last visit: TG high, LDL at goal, HDL low Lab Results  Component Value Date   CHOL 133 01/14/2018   HDL 38.90 (L) 01/14/2018   LDLCALC 58 09/01/2014   LDLDIRECT 70.0 01/14/2018   TRIG 388.0 (H) 01/14/2018   CHOLHDL 3 01/14/2018  - Continues Lipitor without side effects.  - time spent with the patient and his wife: 25 minutes, of which >50% was spent in obtaining information about his s diabetes, reviewing his previous labs, evaluations, and medication regimen, counseling him about his condition (please see the discussed topics above), and developing a plan to further investigate and treat it; he and his wife had a number of questions which I addressed.  Office Visit on 04/23/2018  Component Date Value Ref Range Status  . Fructosamine 04/23/2018 339* 190 - 270 umol/L Final   HbA1c calculated from fructosamine is higher,  at 7.4%.  Philemon Kingdom, MD PhD [facility] Endocrinology

## 2018-04-23 NOTE — Patient Instructions (Addendum)
Please continue: - Metformin 1000 mg 2x a day   Please start taking consistently: - Lantus 20 units in the evening - Humalog 3x a day, 10-15 min before meals: - 8 units before a smaller meal - 10 units before a regular meal - 12-14 units before a large meal  Please return in 3-4 months with your sugar log.

## 2018-04-26 LAB — FRUCTOSAMINE: Fructosamine: 339 umol/L — ABNORMAL HIGH (ref 190–270)

## 2018-06-01 ENCOUNTER — Telehealth: Payer: Self-pay | Admitting: Emergency Medicine

## 2018-06-01 DIAGNOSIS — E1165 Type 2 diabetes mellitus with hyperglycemia: Secondary | ICD-10-CM

## 2018-06-01 DIAGNOSIS — Z794 Long term (current) use of insulin: Principal | ICD-10-CM

## 2018-06-01 MED ORDER — INSULIN LISPRO 100 UNIT/ML (KWIKPEN): 8.0000 [IU] | PEN_INJECTOR | Freq: Three times a day (TID) | SUBCUTANEOUS | 11 refills | Status: DC

## 2018-06-01 NOTE — Telephone Encounter (Signed)
Pt called and requested a refill on his insulin lispro (HUMALOG) 100 UNIT/ML KiwkPen. Pharmacy is [facility] [place]. Thanks.

## 2018-06-01 NOTE — Telephone Encounter (Signed)
Sent!

## 2018-06-07 ENCOUNTER — Telehealth: Payer: Self-pay | Admitting: Internal Medicine

## 2018-06-07 ENCOUNTER — Other Ambulatory Visit: Payer: Self-pay

## 2018-06-07 MED ORDER — ACCU-CHEK GUIDE W/DEVICE KIT: 1.0000 | PACK | Freq: Once | 0 refills | Status: DC

## 2018-06-07 MED ORDER — ACCU-CHEK FASTCLIX LANCET KIT: 1.0000 | PACK | Freq: Four times a day (QID) | 0 refills | Status: DC

## 2018-06-07 MED ORDER — GLUCOSE BLOOD VI STRP: ORAL_STRIP | 2 refills | Status: DC

## 2018-06-07 MED ORDER — ACCU-CHEK GUIDE W/DEVICE KIT: 1.0000 | PACK | Freq: Once | 0 refills | Status: AC

## 2018-06-07 NOTE — Telephone Encounter (Signed)
Patient needs RX for Accucheck Aviva glucose meter sent to [facility] on [address]. Patient has not had a meter since Friday (old one fell apart)

## 2018-06-07 NOTE — Telephone Encounter (Signed)
Pt is aware.  

## 2018-08-24 ENCOUNTER — Ambulatory Visit (INDEPENDENT_AMBULATORY_CARE_PROVIDER_SITE_OTHER): Payer: Medicare Other | Admitting: Internal Medicine

## 2018-08-24 VITALS — BP 130/60 | HR 112 | Ht 67.0 in | Wt 180.0 lb

## 2018-08-24 DIAGNOSIS — E1165 Type 2 diabetes mellitus with hyperglycemia: Secondary | ICD-10-CM | POA: Diagnosis not present

## 2018-08-24 DIAGNOSIS — Z794 Long term (current) use of insulin: Secondary | ICD-10-CM

## 2018-08-24 DIAGNOSIS — E785 Hyperlipidemia, unspecified: Secondary | ICD-10-CM

## 2018-08-24 LAB — POCT GLYCOSYLATED HEMOGLOBIN (HGB A1C): Hemoglobin A1C: 9.3 % — AB (ref 4.0–5.6)

## 2018-08-24 MED ORDER — INSULIN GLARGINE 100 UNITS/ML SOLOSTAR PEN: 25.0000 [IU] | PEN_INJECTOR | Freq: Every day | SUBCUTANEOUS | 3 refills | Status: DC

## 2018-08-24 NOTE — Patient Instructions (Addendum)
Please increase: - Lantus 25 units and move it in the evening  Please continue: - Metformin 1000 mg 2x a day  - Humalog 3x a day, 10-15 min before meals - 8 units before a smaller meal - 10 units before a regular meal - 12-14 units before a large meal  Please return in 3-4 months with your sugar log.

## 2018-08-24 NOTE — Progress Notes (Signed)
Patient ID: Jonathan [name], male   DOB: 1940/10/12, 77 y.o.   MRN: [id number]  Jonathan [name] is a 77 y.o.-year-old male, returning for follow up management of DM2, dx 1990, uncontrolled, insulin-dep since ~2876, without complications (h/o stroke, also mild CKD). Last visit 4 mo ago.  As always, he is here with his wife offers part of the history, especially related to his sugars, insulin doses, and activity.  Last hemoglobin A1c was: 04/23/2018: HbA1c calculated from fructosamine is higher, at 7.4%. 01/14/2018: HbA1c calculated from the fructosamine is better, at 6.7%. 10/16/2017: HbA1c calculated from the fructosamine is better, at 7.18%. 08/14/2017: HbA1c calculated from the fructosamine is 7.5% 05/14/2017: HbA1c calculated from the fructosamine is 7.4% 01/27/2017: HbA1c calculated from the fructosamine is great: 6.2% 10/30/2016: HbA1c calculated from the fructosamine is great, at 6.5% 07/31/2016: HbA1c calculated from the fructosamine is great, at 6.15% Lab Results  Component Value Date   HGBA1C 11 08/14/2017   HGBA1C 7.9 07/31/2016   HGBA1C 7.9 04/29/2016   He is on: - Lantus 20 units in am or in pm -at last visit he was taking this 1-2 times a week >> now taking it consistently - Metformin 1000 mg 2x a day  - Humalog 3x a day, 10-15 min before meals: -At last visit he was taken to sporadically >> now taking it consistently - 8 units before a smaller meal - 10 units before a regular meal - 12-14 units before a large meal  Checks sugars 3 times a day: - am: 89-203, 248 >> 170-260 >> 104-192, 219 - 2h after b'fast: n/c  - lunch: 196-288 >> 146-261, 348 >> 127-205, 220 - 2h after lunch: n/c  - dinner:  171-297 >> 187-251 >> 108-194 - 2h after dinner: 139-280, 373 >> 173-284  >> n/c - bedtime: n/c Highest  273 >> 293 >> 348 >> 220. Lowest  113 >> 89 >> 146 >> 104.  + Stage II-III CKD, last BUN/creatinine: Lab Results  Component Value Date   BUN 25 (H) 12/12/2017   CREATININE  0.98 12/12/2017   Lab Results  Component Value Date   GFR 74.54 11/25/2016   MICRALBCREAT 7.6 09/15/2016  Allergic to ACE inhibitor/ARB's.  Latest ACR normal: Lab Results  Component Value Date   MICRALBCREAT 7.6 09/15/2016   MICRALBCREAT 12.7 04/28/2014   MICRALBCREAT 25.3 12/17/2009   MICRALBCREAT 22.6 02/13/2009   -+ HL: Lab Results  Component Value Date   CHOL 133 01/14/2018   HDL 38.90 (L) 01/14/2018   LDLCALC 58 09/01/2014   LDLDIRECT 70.0 01/14/2018   TRIG 388.0 (H) 01/14/2018   CHOLHDL 3 01/14/2018  On Lipitor. - Last eye exam was in 03/2018: No DR, + cataract. - no numbness and tingling in feet.  He still smokes 1 PPD.  He tells me he is too short of breath for any type of activity.  He is unstable on his feet because of back pain.  Around early 2015, he was hospitalized after syncope, was in ICU, was on assisted ventilation, expressive aphasia recovered- believed to have a stroke, now better.    His wife has stage III lung cancer.  ROS: Constitutional: no weight gain/no weight loss, no fatigue, no subjective hyperthermia, no subjective hypothermia Eyes: no blurry vision, no xerophthalmia ENT: no sore throat, no nodules palpated in neck, no dysphagia, no odynophagia, no hoarseness Cardiovascular: no CP/+ SOB/no palpitations/no leg swelling Respiratory: no cough/+ SOB/no wheezing Gastrointestinal: no N/no V/no D/no C/no acid reflux Musculoskeletal: no muscle  aches/no joint aches Skin: no rashes, no hair loss Neurological: no tremors/no numbness/no tingling/no dizziness  I reviewed pt's medications, allergies, PMH, social hx, family hx, and changes were documented in the history of present illness. Otherwise, unchanged from my initial visit note.  Past Medical History:  Diagnosis Date  . Allergic rhinitis   . Asthma   . COPD (chronic obstructive pulmonary disease) ([place])   . Dermatitis seborrheica   . Diabetes mellitus type II   . ED (erectile dysfunction)    . Frozen shoulder   . HLD (hyperlipidemia)   . HTN (hypertension)   . Hyperkalemia   . Kidney stone   . Other diseases of lung, not elsewhere classified   . Pulmonary nodule, right    lower lobe  . Rotator cuff injury    right  . Shoulder pain   . Stroke [facility])    pt states "last year"  . Tobacco abuse    Past Surgical History:  Procedure Laterality Date  . CERVICAL DISCECTOMY  1998  . EEG    . ETT  11/1994  . TEE WITHOUT CARDIOVERSION  12/03/2011   Procedure: TRANSESOPHAGEAL ECHOCARDIOGRAM (TEE);  Surgeon: Lelon Perla, MD;  Location: [facility] ENDOSCOPY;  Service: Cardiovascular;  Laterality: N/A;  . TM repair     Social History   Socioeconomic History  . Marital status: Married    Spouse name: Not on file  . Number of children: Not on file  . Years of education: Not on file  . Highest education level: Not on file  Occupational History  . Occupation: Retired    Comment: Air cabin crew  Social Needs  . Financial resource strain: Not on file  . Food insecurity:    Worry: Not on file    Inability: Not on file  . Transportation needs:    Medical: Not on file    Non-medical: Not on file  Tobacco Use  . Smoking status: Current Every Day Smoker    Packs/day: 1.00    Types: Cigarettes  . Smokeless tobacco: Never Used  Substance and Sexual Activity  . Alcohol use: No    Alcohol/week: 0.0 standard drinks    Comment: Quit 40 years ago  . Drug use: No  . Sexual activity: Never    Partners: Female  Lifestyle  . Physical activity:    Days per week: Not on file    Minutes per session: Not on file  . Stress: Not on file  Relationships  . Social connections:    Talks on phone: Not on file    Gets together: Not on file    Attends religious service: Not on file    Active member of club or organization: Not on file    Attends meetings of clubs or organizations: Not on file    Relationship status: Not on file  . Intimate partner violence:    Fear of current or ex  partner: Not on file    Emotionally abused: Not on file    Physically abused: Not on file    Forced sexual activity: Not on file  Other Topics Concern  . Not on file  Social History Narrative  . Not on file   Current Outpatient Medications on File Prior to Visit  Medication Sig Dispense Refill  . albuterol (PROVENTIL) (2.5 MG/3ML) 0.083% nebulizer solution Take 3 mLs (2.5 mg total) every 4 (four) hours as needed by nebulization for shortness of breath. 75 mL 0  . albuterol (VENTOLIN HFA) 108 (90  Base) MCG/ACT inhaler Inhale 2 puffs every 6 (six) hours as needed into the lungs for wheezing. 1 Inhaler 0  . aspirin 81 MG tablet Take 1 tablet (81 mg total) by mouth daily. 30 tablet 0  . atorvastatin (LIPITOR) 10 MG tablet Take 1 tablet (10 mg total) by mouth daily. 90 tablet 1  . dextromethorphan-guaiFENesin (MUCINEX DM) 30-600 MG 12hr tablet Take 1 tablet 2 (two) times daily by mouth. 30 tablet 0  . fluticasone furoate-vilanterol (BREO ELLIPTA) 100-25 MCG/INH AEPB Inhale 1 puff daily into the lungs. 60 each 0  . glipiZIDE (GLUCOTROL XL) 10 MG 24 hr tablet TAKE 1 TABLET IN THE  MORNING AND HALF A TABLET  BEFORE DINNER (Patient taking differently: TAKE 1 TABLET IN THE  MORNING) 135 tablet 2  . GLOBAL EASE INJECT PEN NEEDLES 32G X 4 MM MISC AS DIRECTED 100 each 2  . glucose blood (ACCU-CHEK GUIDE) test strip Use as instructed to test 4 times daily DX:E11.65 400 each 2  . guaiFENesin-dextromethorphan (ROBITUSSIN DM) 100-10 MG/5ML syrup Take 5 mLs by mouth every 4 (four) hours as needed for cough. 118 mL 0  . insulin glargine (LANTUS) 100 unit/mL SOPN Inject 0.2 mLs (20 Units total) into the skin daily at 8 pm. 15 mL 2  . insulin lispro (HUMALOG) 100 UNIT/ML KiwkPen Inject 0.08-0.14 mLs (8-14 Units total) into the skin 3 (three) times daily. 15 mL 11  . Lancets Misc. (ACCU-CHEK FASTCLIX LANCET) KIT 1 each by Does not apply route QID. DX: E11.65 400 kit 0  . lisinopril (PRINIVIL,ZESTRIL) 20 MG tablet  Take 1 tablet (20 mg total) by mouth daily. 30 tablet 3  . metFORMIN (GLUCOPHAGE) 1000 MG tablet Take 1,039m by mouth twice a day 180 tablet 3  . predniSONE (DELTASONE) 10 MG tablet Takes  4 tablets for 1 days, then 3 tablets for 1 days, then 2 tabs for 1 days, then 1 tab for 1 days, and then stop. 10 tablet 0   No current facility-administered medications on file prior to visit.    Allergies  Allergen Reactions  . Amoxicillin-Pot Clavulanate Other (See Comments)    Severe stomach pain.   . Quinapril Hcl     REACTION: back pain  . Valsartan     REACTION: back pain  . Varenicline Tartrate     REACTION: AMS, hallucinations, night mares   Family History  Problem Relation Age of Onset  . Asthma Sister   . Emphysema Sister   . Diabetes Mother   . Diabetes Brother   . Heart disease Brother    PE: BP 130/60   Pulse (!) 112   Ht _0  (1.702 m) Comment: measured  Wt 180 lb (81.6 kg)   SpO2 93%   BMI 28.19 kg/m  Body mass index is 27.13 kg/m. Wt Readings from Last 3 Encounters:  08/24/18 180 lb (81.6 kg)  04/23/18 173 lb 3.2 oz (78.6 kg)  01/14/18 177 lb 12.8 oz (80.6 kg)   Constitutional: overweight, in NAD, in wheelchair Eyes: PERRLA, EOMI, no exophthalmos ENT: moist mucous membranes, no thyromegaly, no cervical lymphadenopathy Cardiovascular: tachycardia, RR, No MRG Respiratory: CTA B Gastrointestinal: abdomen soft, NT, ND, BS+ Musculoskeletal: no deformities, strength intact in all 4 Skin: moist, warm, no rashes Neurological: no tremor with outstretched hands, DTR normal in all 4  ASSESSMENT: 1. DM2, insulin-dependent, uncontrolled, with complications - Cerebrovascular disease-h/o stroke  2. HL  3. Smoking  PLAN:  1. Patient with long-standing, uncontrolled, type 2 diabetes, on  basal-bolus insulin regimen and also metformin.  We stopped glipizide earlier in the year, when we started Humalog.  At last visit, he was taking Lantus only may be 1-2 times a week and  also Humalog inconsistently, only when sugars are high.  Therefore, sugars were still high and his HbA1c calculated from fructosamine was higher.  We discussed at that time that if he does not make an effort to take his medications as prescribed, I cannot help him.  At that time, his wife was telling me that the patient is actually not getting up from the couch and not even showering.  She was concerned about him. - At this visit, his sugars are better, after starting to take the insulin doses as advised. He still has some high CBGs, but overall, they are much better. Will increase his Lantus a little for now, but continue Humalog. Advised him not to take it if not eating. - I suggested to:  Patient Instructions  Please increase: - Lantus 25 units and move it in the evening  Please continue: - Metformin 1000 mg 2x a day  - Humalog 3x a day, 10-15 min before meals - 8 units before a smaller meal - 10 units before a regular meal - 12-14 units before a large meal  Please return in 3-4 months with your sugar log.   - today, HbA1c is 9.3%.  Fructosamine levels are more accurate for him.  We will check one today. - continue checking sugars at different times of the day - check 3x a day, rotating checks - advised for yearly eye exams >> he is UTD - Return to clinic in 3-4 mo with sugar log     2. HL  - Reviewed latest lipid panel from 12/2017: Triglycerides high, HDL low, but LDL at goal Lab Results  Component Value Date   CHOL 133 01/14/2018   HDL 38.90 (L) 01/14/2018   LDLCALC 58 09/01/2014   LDLDIRECT 70.0 01/14/2018   TRIG 388.0 (H) 01/14/2018   CHOLHDL 3 01/14/2018  - Continues Lipitor without side effects.  3. Smoking - still smokes 1 PPD >> strongly advised to stop  Office Visit on 08/24/2018  Component Date Value Ref Range Status  . Fructosamine 08/24/2018 307* 205 - 285 umol/L Final  . Hemoglobin A1C 08/24/2018 9.3* 4.0 - 5.6 % Final  Hba1c calculated from fructosamine has  improved to 6.8%.  Philemon Kingdom, MD PhD [facility] Endocrinology

## 2018-08-30 LAB — FRUCTOSAMINE: Fructosamine: 307 umol/L — ABNORMAL HIGH (ref 205–285)

## 2018-08-31 ENCOUNTER — Encounter: Payer: Self-pay | Admitting: Internal Medicine

## 2018-09-01 ENCOUNTER — Telehealth: Payer: Self-pay | Admitting: Internal Medicine

## 2018-09-01 NOTE — Telephone Encounter (Signed)
Patient was calling to get lab results-I gave him results and he stated he understood-no questions at this time

## 2018-09-01 NOTE — Telephone Encounter (Signed)
Patient returning call to him

## 2018-09-02 ENCOUNTER — Telehealth: Payer: Self-pay

## 2018-09-02 NOTE — Telephone Encounter (Signed)
error 

## 2018-10-20 ENCOUNTER — Other Ambulatory Visit: Payer: Self-pay

## 2018-10-20 MED ORDER — INSULIN PEN NEEDLE 32G X 4 MM MISC: 2 refills | Status: DC

## 2018-12-23 ENCOUNTER — Other Ambulatory Visit: Payer: Self-pay

## 2018-12-24 ENCOUNTER — Ambulatory Visit (INDEPENDENT_AMBULATORY_CARE_PROVIDER_SITE_OTHER): Payer: Medicare Other | Admitting: Internal Medicine

## 2018-12-24 ENCOUNTER — Other Ambulatory Visit: Payer: Self-pay

## 2018-12-24 ENCOUNTER — Encounter: Payer: Self-pay | Admitting: Internal Medicine

## 2018-12-24 ENCOUNTER — Other Ambulatory Visit: Payer: Self-pay | Admitting: Internal Medicine

## 2018-12-24 VITALS — BP 129/80 | HR 108 | Temp 97.7°F | Ht 67.0 in | Wt 178.0 lb

## 2018-12-24 DIAGNOSIS — E1165 Type 2 diabetes mellitus with hyperglycemia: Secondary | ICD-10-CM

## 2018-12-24 DIAGNOSIS — Z794 Long term (current) use of insulin: Secondary | ICD-10-CM

## 2018-12-24 DIAGNOSIS — E785 Hyperlipidemia, unspecified: Secondary | ICD-10-CM | POA: Diagnosis not present

## 2018-12-24 LAB — LIPID PANEL
Cholesterol: 196 mg/dL (ref 0–200)
HDL: 42.1 mg/dL (ref >39.00)
NonHDL: 154
Total CHOL/HDL Ratio: 5
Triglycerides: 289 mg/dL — ABNORMAL HIGH (ref 0.0–149.0)
VLDL: 57.8 mg/dL — ABNORMAL HIGH (ref 0.0–40.0)

## 2018-12-24 LAB — POCT GLYCOSYLATED HEMOGLOBIN (HGB A1C): Hemoglobin A1C: 10 % — AB (ref 4.0–5.6)

## 2018-12-24 LAB — LDL CHOLESTEROL, DIRECT: Direct LDL: 118 mg/dL

## 2018-12-24 MED ORDER — INSULIN GLARGINE 100 UNITS/ML SOLOSTAR PEN: 28.0000 [IU] | PEN_INJECTOR | Freq: Every day | SUBCUTANEOUS | 5 refills | Status: DC

## 2018-12-24 MED ORDER — INSULIN GLARGINE 100 UNIT/ML SOLOSTAR PEN: 28.0000 [IU] | PEN_INJECTOR | Freq: Every day | SUBCUTANEOUS | 11 refills | Status: DC

## 2018-12-24 MED ORDER — METFORMIN HCL 1000 MG PO TABS: ORAL_TABLET | ORAL | 3 refills | Status: DC

## 2018-12-24 NOTE — Patient Instructions (Addendum)
Please increase: - Lantus 28-30 units in the evening, around dinnertime  Please continue: - Metformin 1000 mg 2x a day  - Humalog 3x a day, 10-15 min before meals - 8 units before a smaller meal - 10 units before a regular meal - 12-14 units before a large meal  Please stop at the lab.  Please return in 4 months with your sugar log.

## 2018-12-24 NOTE — Progress Notes (Signed)
Patient ID: Jonathan [name], male   DOB: 03-Jul-1941, 78 y.o.   MRN: [id number]  Jonathan [name] is a 78 y.o.-year-old male, returning for follow-up for DM2, dx 1990, uncontrolled, insulin-dep since ~8338, without complications (h/o stroke, also mild CKD). Last visit 4 months ago.  Last hemoglobin A1c was: 08/24/2018: Hba1c calculated from fructosamine has improved to 6.8%. 04/23/2018: HbA1c calculated from fructosamine is higher, at 7.4%. 01/14/2018: HbA1c calculated from the fructosamine is better, at 6.7%. 10/16/2017: HbA1c calculated from the fructosamine is better, at 7.18%. 08/14/2017: HbA1c calculated from the fructosamine is 7.5% 05/14/2017: HbA1c calculated from the fructosamine is 7.4% 01/27/2017: HbA1c calculated from the fructosamine is great: 6.2% 10/30/2016: HbA1c calculated from the fructosamine is great, at 6.5% 07/31/2016: HbA1c calculated from the fructosamine is great, at 6.15% Lab Results  Component Value Date   HGBA1C 9.3 (A) 08/24/2018   HGBA1C 11 08/14/2017   HGBA1C 7.9 07/31/2016   He is on: - Lantus 20 >> 25 units in the evening - Metformin 1000 mg 2x a day  - Humalog 3x a day, 10-15 min before meals - 8 units before a smaller meal - 10 units before a regular meal - 12-14 units before a large meal  Checks sugars 3 times a day: - am: 89-203, 248 >> 170-260 >> 104-192, 219 >> 120-349 - 2h after b'fast: n/c  - lunch: 196-288 >> 146-261, 348 >> 127-205, 220 >> 96-190, 227, 270 - 2h after lunch: n/c  - dinner:  171-297 >> 187-251 >> 108-194 >> 90-180, 200 - 2h after dinner: 139-280, 373 >> 173-284  >> n/c - bedtime: n/c Lowest  146 >> 104 >> 90. Highest  348 >> 220 >> 349.  + Stage II-III CKD, last BUN/creatinine: Lab Results  Component Value Date   BUN 25 (H) 12/12/2017   CREATININE 0.98 12/12/2017   Lab Results  Component Value Date   GFR 74.54 11/25/2016   MICRALBCREAT 7.6 09/15/2016  Allergic to ACE inhibitors/ARB's.  ACR levels normal: Lab  Results  Component Value Date   MICRALBCREAT 7.6 09/15/2016   MICRALBCREAT 12.7 04/28/2014   MICRALBCREAT 25.3 12/17/2009   MICRALBCREAT 22.6 02/13/2009   -+ HL: Lab Results  Component Value Date   CHOL 133 01/14/2018   HDL 38.90 (L) 01/14/2018   LDLCALC 58 09/01/2014   LDLDIRECT 70.0 01/14/2018   TRIG 388.0 (H) 01/14/2018   CHOLHDL 3 01/14/2018  On Lipitor. - Last eye exam was in 03/2018: No DR, + cataract -He denies numbness and tingling in feet.  He still smokes 1 PPD.  He is too short of breath or any type of activity.  He is also unstable on his feet because of back pain.  Around early 2015, he was hospitalized after syncope, was in ICU, was on assisted ventilation, expressive aphasia recovered- believed to have a stroke, now better.  His wife has stage III lung cancer.  ROS: Constitutional: no weight gain/no weight loss, + fatigue, no subjective hyperthermia, no subjective hypothermia Eyes: no blurry vision, no xerophthalmia ENT: no sore throat, no nodules palpated in neck, no dysphagia, no odynophagia, no hoarseness Cardiovascular: no CP/+ SOB/no palpitations/no leg swelling Respiratory: no cough/+ SOB/+ wheezing Gastrointestinal: no N/no V/no D/no C/no acid reflux Musculoskeletal: no muscle aches/no joint aches Skin: no rashes, no hair loss Neurological: no tremors/no numbness/no tingling/no dizziness  I reviewed pt's medications, allergies, PMH, social hx, family hx, and changes were documented in the history of present illness. Otherwise, unchanged from my initial visit note.  Past Medical History:  Diagnosis Date  . Allergic rhinitis   . Asthma   . COPD (chronic obstructive pulmonary disease) ([place])   . Dermatitis seborrheica   . Diabetes mellitus type II   . ED (erectile dysfunction)   . Frozen shoulder   . HLD (hyperlipidemia)   . HTN (hypertension)   . Hyperkalemia   . Kidney stone   . Other diseases of lung, not elsewhere classified   . Pulmonary  nodule, right    lower lobe  . Rotator cuff injury    right  . Shoulder pain   . Stroke [facility])    pt states "last year"  . Tobacco abuse    Past Surgical History:  Procedure Laterality Date  . CERVICAL DISCECTOMY  1998  . EEG    . ETT  11/1994  . TEE WITHOUT CARDIOVERSION  12/03/2011   Procedure: TRANSESOPHAGEAL ECHOCARDIOGRAM (TEE);  Surgeon: Lelon Perla, MD;  Location: [facility] ENDOSCOPY;  Service: Cardiovascular;  Laterality: N/A;  . TM repair     Social History   Socioeconomic History  . Marital status: Married    Spouse name: Not on file  . Number of children: Not on file  . Years of education: Not on file  . Highest education level: Not on file  Occupational History  . Occupation: Retired    Comment: Air cabin crew  Social Needs  . Financial resource strain: Not on file  . Food insecurity:    Worry: Not on file    Inability: Not on file  . Transportation needs:    Medical: Not on file    Non-medical: Not on file  Tobacco Use  . Smoking status: Current Every Day Smoker    Packs/day: 1.00    Types: Cigarettes  . Smokeless tobacco: Never Used  Substance and Sexual Activity  . Alcohol use: No    Alcohol/week: 0.0 standard drinks    Comment: Quit 40 years ago  . Drug use: No  . Sexual activity: Never    Partners: Female  Lifestyle  . Physical activity:    Days per week: Not on file    Minutes per session: Not on file  . Stress: Not on file  Relationships  . Social connections:    Talks on phone: Not on file    Gets together: Not on file    Attends religious service: Not on file    Active member of club or organization: Not on file    Attends meetings of clubs or organizations: Not on file    Relationship status: Not on file  . Intimate partner violence:    Fear of current or ex partner: Not on file    Emotionally abused: Not on file    Physically abused: Not on file    Forced sexual activity: Not on file  Other Topics Concern  . Not on file  Social  History Narrative  . Not on file   Current Outpatient Medications on File Prior to Visit  Medication Sig Dispense Refill  . albuterol (PROVENTIL) (2.5 MG/3ML) 0.083% nebulizer solution Take 3 mLs (2.5 mg total) every 4 (four) hours as needed by nebulization for shortness of breath. 75 mL 0  . albuterol (VENTOLIN HFA) 108 (90 Base) MCG/ACT inhaler Inhale 2 puffs every 6 (six) hours as needed into the lungs for wheezing. 1 Inhaler 0  . aspirin 81 MG tablet Take 1 tablet (81 mg total) by mouth daily. 30 tablet 0  . atorvastatin (LIPITOR) 10 MG  tablet Take 1 tablet (10 mg total) by mouth daily. 90 tablet 1  . fluticasone furoate-vilanterol (BREO ELLIPTA) 100-25 MCG/INH AEPB Inhale 1 puff daily into the lungs. 60 each 0  . glucose blood (ACCU-CHEK GUIDE) test strip Use as instructed to test 4 times daily DX:E11.65 400 each 2  . insulin glargine (LANTUS) 100 unit/mL SOPN Inject 0.25 mLs (25 Units total) into the skin at bedtime. 15 mL 3  . insulin lispro (HUMALOG) 100 UNIT/ML KiwkPen Inject 0.08-0.14 mLs (8-14 Units total) into the skin 3 (three) times daily. 15 mL 11  . Insulin Pen Needle (BD PEN NEEDLE NANO U/F) 32G X 4 MM MISC Use to inject insulin 4 times a day. 100 each 2  . Lancets Misc. (ACCU-CHEK FASTCLIX LANCET) KIT 1 each by Does not apply route QID. DX: E11.65 400 kit 0  . lisinopril (PRINIVIL,ZESTRIL) 20 MG tablet Take 1 tablet (20 mg total) by mouth daily. 30 tablet 3  . metFORMIN (GLUCOPHAGE) 1000 MG tablet Take 1,071m by mouth twice a day 180 tablet 3  . predniSONE (DELTASONE) 10 MG tablet Takes  4 tablets for 1 days, then 3 tablets for 1 days, then 2 tabs for 1 days, then 1 tab for 1 days, and then stop. 10 tablet 0   No current facility-administered medications on file prior to visit.    Allergies  Allergen Reactions  . Amoxicillin-Pot Clavulanate Other (See Comments)    Severe stomach pain.   . Quinapril Hcl     REACTION: back pain  . Valsartan     REACTION: back pain  .  Varenicline Tartrate     REACTION: AMS, hallucinations, night mares   Family History  Problem Relation Age of Onset  . Asthma Sister   . Emphysema Sister   . Diabetes Mother   . Diabetes Brother   . Heart disease Brother    PE: BP 129/80   Pulse (!) 108   Temp 97.7 F (36.5 C)   Ht 5' 7"  (1.702 m)   Wt 178 lb (80.7 kg)   SpO2 94%   BMI 27.88 kg/m  Body mass index is 27.88 kg/m. Wt Readings from Last 3 Encounters:  12/24/18 178 lb (80.7 kg)  08/24/18 180 lb (81.6 kg)  04/23/18 173 lb 3.2 oz (78.6 kg)   Constitutional: overweight, in NAD Eyes: PERRLA, EOMI, no exophthalmos ENT: moist mucous membranes, no thyromegaly, no cervical lymphadenopathy Cardiovascular: tachycardia, RR, No MRG Respiratory: CTA B Gastrointestinal: abdomen soft, NT, ND, BS+ Musculoskeletal: no deformities, strength intact in all 4 Skin: moist, warm, no rashes Neurological: no tremor with outstretched hands, DTR normal in all 4  ASSESSMENT: 1. DM2, insulin-dependent, uncontrolled, with complications - Cerebrovascular disease-h/o stroke  2. HL  3. Smoking  PLAN:  1. Patient with longstanding, uncontrolled, type 2 diabetes, on basal-bolus insulin regimen, and also metformin.  We stopped glipizide at the beginning of last year, will restart Humalog.  He was initially not compliant with his Lantus and Humalog and sugars remain high even after starting on this regimen.  However, at last visit, he was doing better with checking his sugars consistently and taking the doses of Lantus and Humalog and the HbA1c calculated from fructosamine improved to 6.8%.  At last visit, he still had some high blood sugars, so increase the Lantus and continued the same dose of Humalog.  Also, advised him to not take the Humalog if he does not plan to eat.  -At this visit, sugars are higher in  the morning and are approximately the same as last time, variable, later in the day.  Upon questioning, patient is again missing Lantus  doses, at least approximately twice a week, as he falls asleep at night without taking it.  I advised him to move the Lantus to dinnertime and also increase the dose slightly.  I do not feel he needs changes in his Humalog regimen - I suggested to:  Patient Instructions  Please increase: - Lantus 28-30 units in the evening, around dinnertime  Please continue: - Metformin 1000 mg 2x a day  - Humalog 3x a day, 10-15 min before meals - 8 units before a smaller meal - 10 units before a regular meal - 12-14 units before a large meal  Please stop at the lab.  Please return in 4 months with your sugar log.   - today will check a fructosamine level along with his annual labs - HbA1c today: higher, 10% - continue checking sugars at different times of the day - check 3x a day, rotating checks - advised for yearly eye exams >> he is UTD - Return to clinic in 3-4 mo with sugar log     2. HL  - Reviewed latest lipid panel from 12/2017: TG high, LDL great, HDL low Lab Results  Component Value Date   CHOL 133 01/14/2018   HDL 38.90 (L) 01/14/2018   LDLCALC 58 09/01/2014   LDLDIRECT 70.0 01/14/2018   TRIG 388.0 (H) 01/14/2018   CHOLHDL 3 01/14/2018  - Continues Lipitor without side effects.  3. Smoking -He still smokes 1 pack a day.  We discussed at every visit including today about the absolute necessity to stop.  Office Visit on 12/24/2018  Component Date Value Ref Range Status  . Fructosamine 12/24/2018 330* 205 - 285 umol/L Final  . Hemoglobin A1C 12/24/2018 10.0* 4.0 - 5.6 % Final  . Glucose, Bld 12/24/2018 126* 65 - 99 mg/dL Final   Comment: .            Fasting reference interval . For someone without known diabetes, a glucose value >125 mg/dL indicates that they may have diabetes and this should be confirmed with a follow-up test. .   . BUN 12/24/2018 18  7 - 25 mg/dL Final  . Creat 12/24/2018 1.33* 0.70 - 1.18 mg/dL Final   Comment: For patients >38 years of age, the  reference limit for Creatinine is approximately 13% higher for people identified as African-American. .   . GFR, Est Non African American 12/24/2018 51* > OR = 60 mL/min/1.14m Final  . GFR, Est African American 12/24/2018 59* > OR = 60 mL/min/1.722mFinal  . BUN/Creatinine Ratio 12/24/2018 14  6 - 22 (calc) Final  . Sodium 12/24/2018 140  135 - 146 mmol/L Final  . Potassium 12/24/2018 5.7* 3.5 - 5.3 mmol/L Final  . Chloride 12/24/2018 99  98 - 110 mmol/L Final  . CO2 12/24/2018 28  20 - 32 mmol/L Final  . Calcium 12/24/2018 10.1  8.6 - 10.3 mg/dL Final  . Total Protein 12/24/2018 7.4  6.1 - 8.1 g/dL Final  . Albumin 12/24/2018 4.6  3.6 - 5.1 g/dL Final  . Globulin 12/24/2018 2.8  1.9 - 3.7 g/dL (calc) Final  . AG Ratio 12/24/2018 1.6  1.0 - 2.5 (calc) Final  . Total Bilirubin 12/24/2018 1.1  0.2 - 1.2 mg/dL Final  . Alkaline phosphatase (APISO) 12/24/2018 80  35 - 144 U/L Final  . AST 12/24/2018 19  10 -  35 U/L Final  . ALT 12/24/2018 18  9 - 46 U/L Final  . Cholesterol 12/24/2018 196  0 - 200 mg/dL Final   ATP III Classification       Desirable:  < 200 mg/dL               Borderline High:  200 - 239 mg/dL          High:  > = 240 mg/dL  . Triglycerides 12/24/2018 289.0* 0.0 - 149.0 mg/dL Final   Normal:  <150 mg/dLBorderline High:  150 - 199 mg/dL  . HDL 12/24/2018 42.10  >39.00 mg/dL Final  . VLDL 12/24/2018 57.8* 0.0 - 40.0 mg/dL Final  . Total CHOL/HDL Ratio 12/24/2018 5   Final                  Men          Women1/2 Average Risk     3.4          3.3Average Risk          5.0          4.42X Average Risk          9.6          7.13X Average Risk          15.0          11.0                      . NonHDL 12/24/2018 154.00   Final   NOTE:  Non-HDL goal should be 30 mg/dL higher than patient's LDL goal (i.e. LDL goal of < 70 mg/dL, would have non-HDL goal of < 100 mg/dL)  . Direct LDL 12/24/2018 118.0  mg/dL Final   Optimal:  <100 mg/dLNear or Above Optimal:  100-129 mg/dLBorderline  High:  130-159 mg/dLHigh:  160-189 mg/dLVery High:  >190 mg/dL   HbA1c calculated from fructosamine 7.22%, slightly higher than before.  His potassium is high and kidney function is lower.  Patient was called over the weekend to decrease potassium rich foods (he is not taking lisinopril) and also to arrange follow-up for another BMP.  He will have this checked in the next few days at PCPs office.  Philemon Kingdom, MD PhD [facility] Endocrinology

## 2018-12-27 ENCOUNTER — Other Ambulatory Visit (INDEPENDENT_AMBULATORY_CARE_PROVIDER_SITE_OTHER): Payer: Medicare Other

## 2018-12-27 ENCOUNTER — Telehealth (INDEPENDENT_AMBULATORY_CARE_PROVIDER_SITE_OTHER): Payer: Medicare Other | Admitting: Family Medicine

## 2018-12-27 ENCOUNTER — Other Ambulatory Visit: Payer: Self-pay

## 2018-12-27 DIAGNOSIS — I10 Essential (primary) hypertension: Secondary | ICD-10-CM | POA: Diagnosis not present

## 2018-12-27 DIAGNOSIS — E875 Hyperkalemia: Secondary | ICD-10-CM

## 2018-12-27 LAB — RENAL FUNCTION PANEL
Albumin: 4.3 g/dL (ref 3.5–5.2)
BUN: 13 mg/dL (ref 6–23)
CO2: 30 mEq/L (ref 19–32)
Calcium: 9.3 mg/dL (ref 8.4–10.5)
Chloride: 94 mEq/L — ABNORMAL LOW (ref 96–112)
Creatinine, Ser: 1.18 mg/dL (ref 0.40–1.50)
GFR: 59.62 mL/min — ABNORMAL LOW (ref >60.00)
Glucose, Bld: 237 mg/dL — ABNORMAL HIGH (ref 70–99)
Phosphorus: 2.9 mg/dL (ref 2.3–4.6)
Potassium: 4.7 mEq/L (ref 3.5–5.1)
Sodium: 133 mEq/L — ABNORMAL LOW (ref 135–145)

## 2018-12-27 LAB — COMPLETE METABOLIC PANEL WITH GFR
AG Ratio: 1.6 (calc) (ref 1.0–2.5)
ALT: 18 U/L (ref 9–46)
AST: 19 U/L (ref 10–35)
Albumin: 4.6 g/dL (ref 3.6–5.1)
Alkaline phosphatase (APISO): 80 U/L (ref 35–144)
BUN/Creatinine Ratio: 14 (calc) (ref 6–22)
BUN: 18 mg/dL (ref 7–25)
CO2: 28 mmol/L (ref 20–32)
Calcium: 10.1 mg/dL (ref 8.6–10.3)
Chloride: 99 mmol/L (ref 98–110)
Creat: 1.33 mg/dL — ABNORMAL HIGH (ref 0.70–1.18)
GFR, Est African American: 59 mL/min/{1.73_m2} — ABNORMAL LOW (ref >=60)
GFR, Est Non African American: 51 mL/min/{1.73_m2} — ABNORMAL LOW (ref >=60)
Globulin: 2.8 g/dL (calc) (ref 1.9–3.7)
Glucose, Bld: 126 mg/dL — ABNORMAL HIGH (ref 65–99)
Potassium: 5.7 mmol/L — ABNORMAL HIGH (ref 3.5–5.3)
Sodium: 140 mmol/L (ref 135–146)
Total Bilirubin: 1.1 mg/dL (ref 0.2–1.2)
Total Protein: 7.4 g/dL (ref 6.1–8.1)

## 2018-12-27 LAB — FRUCTOSAMINE: Fructosamine: 330 umol/L — ABNORMAL HIGH (ref 205–285)

## 2018-12-27 NOTE — Telephone Encounter (Signed)
-----   Message from Tammi Sou, [place] sent at 12/27/2018  9:31 AM EDT ----- Pt said Dr. Cruzita Lederer wanted him to get labs done asap so he wants to come in today. appt scheduled for this afternoon

## 2018-12-28 ENCOUNTER — Other Ambulatory Visit: Payer: Medicare Other

## 2018-12-28 ENCOUNTER — Telehealth: Payer: Self-pay | Admitting: Internal Medicine

## 2018-12-28 NOTE — Telephone Encounter (Signed)
Patient had repeat potassium done yesterday that came back normal.  Please advise if there was anything else he needed.

## 2018-12-28 NOTE — Telephone Encounter (Signed)
No, I was able to review Dr. Marliss Coots labs.  Sugar was high, so please tell him to make sure not to miss any insulin doses.

## 2018-12-28 NOTE — Telephone Encounter (Signed)
Patient stated that he was told he needed to come back into the office today for labs. He is not sure why he needs to do this because he stated he was just at dr tower's office yesterday and had labs in our office Friday  He wants to Double check with Dr Cruzita Lederer that he needs to come in before he drives out here  Please advise

## 2018-12-29 NOTE — Telephone Encounter (Signed)
I called patient, it rans a few times then sounded like someone answered the phone. I could hear breathing and tv noise. I said hello a few times with no response then call was disconnected.  If patient calls back please get a good contact number!

## 2019-05-02 ENCOUNTER — Other Ambulatory Visit: Payer: Self-pay | Admitting: Family Medicine

## 2019-05-03 NOTE — Telephone Encounter (Signed)
No recent appt but did have labs here in March, Amlodipine isn't on med list and lipitor hasn't been filled since 07/14/2017, please advise

## 2019-05-03 NOTE — Telephone Encounter (Signed)
I have not seen him since 2018  I actually did not know if he was still my patient He sees Dr Cruzita Lederer fairly frequently for diabetes   Please clarify with him Thanks

## 2019-05-03 NOTE — Telephone Encounter (Signed)
Left VM requesting pt to call the office back 

## 2019-05-06 NOTE — Telephone Encounter (Signed)
Pt doesn't have a new PCP, he just doesn't have transportation to get to the office right now. He said his care taker is sick. Pt doesn't have a smart phone or laptop so phone call appt scheduled for Monday. Will hold med until he sees Dr. Glori Bickers on Monday, pt said he has enough meds until Dr. Glori Bickers refills them (FYI: amlodipine isn't on med list)

## 2019-05-06 NOTE — Telephone Encounter (Signed)
I will see him then Thanks

## 2019-05-09 ENCOUNTER — Encounter: Payer: Self-pay | Admitting: Family Medicine

## 2019-05-09 ENCOUNTER — Ambulatory Visit (INDEPENDENT_AMBULATORY_CARE_PROVIDER_SITE_OTHER): Payer: Medicare Other | Admitting: Family Medicine

## 2019-05-09 ENCOUNTER — Other Ambulatory Visit: Payer: Self-pay

## 2019-05-09 DIAGNOSIS — Z794 Long term (current) use of insulin: Secondary | ICD-10-CM

## 2019-05-09 DIAGNOSIS — G40209 Localization-related (focal) (partial) symptomatic epilepsy and epileptic syndromes with complex partial seizures, not intractable, without status epilepticus: Secondary | ICD-10-CM

## 2019-05-09 DIAGNOSIS — E785 Hyperlipidemia, unspecified: Secondary | ICD-10-CM

## 2019-05-09 DIAGNOSIS — J9611 Chronic respiratory failure with hypoxia: Secondary | ICD-10-CM

## 2019-05-09 DIAGNOSIS — J449 Chronic obstructive pulmonary disease, unspecified: Secondary | ICD-10-CM

## 2019-05-09 DIAGNOSIS — Z720 Tobacco use: Secondary | ICD-10-CM

## 2019-05-09 DIAGNOSIS — E1165 Type 2 diabetes mellitus with hyperglycemia: Secondary | ICD-10-CM | POA: Diagnosis not present

## 2019-05-09 DIAGNOSIS — I10 Essential (primary) hypertension: Secondary | ICD-10-CM | POA: Diagnosis not present

## 2019-05-09 MED ORDER — ATORVASTATIN CALCIUM 10 MG PO TABS: 10.0000 mg | ORAL_TABLET | Freq: Every day | ORAL | 3 refills | Status: DC

## 2019-05-09 MED ORDER — AMLODIPINE BESYLATE 5 MG PO TABS: 5.0000 mg | ORAL_TABLET | Freq: Every day | ORAL | 3 refills | Status: DC

## 2019-05-09 NOTE — Assessment & Plan Note (Signed)
Pt continues to see Dr Cruzita Lederer  Has appt later this mo Lab Results  Component Value Date   HGBA1C 10.0 (A) 12/24/2018   Per pt -glucose levels are improved since last lantus adj in march  For upcoming appt  No longer on ace (? Why)  Taking statin  Continues to smoke Unsure if compliant with diet Cannot exercise due to copd

## 2019-05-09 NOTE — Assessment & Plan Note (Signed)
Will order lipids with next labs on 8/18 at [facility]  Disc goals for lipids and reasons to control them Rev last labs with pt Rev low sat fat diet in detail Diet is fair

## 2019-05-09 NOTE — Assessment & Plan Note (Signed)
Disc in detail risks of smoking and possible outcomes including copd, vascular/ heart disease, cancer , respiratory and sinus infections  Pt voices understanding Has cut down to 1/2 ppd approx  Not yet motivated to completely quit despite copd

## 2019-05-09 NOTE — Assessment & Plan Note (Signed)
Due to severe copd Unfortunately continues to smoke Unsure if motivated to totally quit  Enc strongly to consider

## 2019-05-09 NOTE — Assessment & Plan Note (Signed)
Pt continues to smoke Remains sob on exertion  Also chronic resp failure Albuterol and breo -on med list -unsure if he uses compliantly  Enc strongly to quit smoking  Unfortunately continues to refuse immunizations for flu and pneumonia as well

## 2019-05-09 NOTE — Assessment & Plan Note (Signed)
No new occurances

## 2019-05-09 NOTE — Assessment & Plan Note (Signed)
bp in fair control at this time  BP Readings from Last 1 Encounters:  12/24/18 129/80   No changes needed Most recent labs reviewed  Disc lifstyle change with low sodium diet and exercise  He will continue to monitor at home  Also have a visit to endocrinology with bp later this mo

## 2019-05-09 NOTE — Progress Notes (Signed)
Virtual Visit via Telephone Note  I connected with Jonathan [name] on 05/09/19 at  3:45 PM EDT by telephone and verified that I am speaking with the correct person using two identifiers.  Location: Patient: home Provider: office    I discussed the limitations, risks, security and privacy concerns of performing an evaluation and management service by telephone and the availability of in person appointments. I also discussed with the patient that there may be a patient responsible charge related to this service. The patient expressed understanding and agreed to proceed.   History of Present Illness: Pt presents for f/u of chronic medical problems   Is staying home during the pandemic   Declines all immunizations   Weight : 172 lb  Wt Readings from Last 3 Encounters:  12/24/18 178 lb (80.7 kg)  08/24/18 180 lb (81.6 kg)  04/23/18 173 lb 3.2 oz (78.6 kg)  wt is down  Eating ok   Smoking status : still smoking  Still smokes 1/2 pack per day average   Hypertension  BP Readings from Last 3 Encounters:  12/24/18 129/80  08/24/18 130/60  04/23/18 120/70   No problems with it  142/77 when exerting himself  Taking amlodipine 5 mg  Not on lisinopril   Keeps up with his eye exams 03/25/18  Per pt no retinopathy  Has cataracts    Copd and chronic resp failure  Fairly stable  Does not breathe well in the heat -so he stays in  He gets sob on exertion (he does not exert much)  Breo  Albuterol   DM2 Lab Results  Component Value Date   HGBA1C 10.0 (A) 12/24/2018  sees Dr Cruzita Lederer He thinks he is in better control now  Usually 100-150 glucose levels  Adj lantus in march  Is eating "healthy" -except for 3 am snack  Taking care of himself    Has appt with Dr Cruzita Lederer   Hyperlipidemia Lab Results  Component Value Date   CHOL 196 12/24/2018   HDL 42.10 12/24/2018   LDLCALC 58 09/01/2014   LDLDIRECT 118.0 12/24/2018   TRIG 289.0 (H) 12/24/2018   CHOLHDL 5 12/24/2018      Patient Active Problem List   Diagnosis Date Noted  . Routine general medical examination at a health care facility 11/25/2016  . Localization-related symptomatic epilepsy and epileptic syndromes with complex partial seizures, not intractable, without status epilepticus ([place]) 05/06/2016  . Intracranial vascular stenosis 05/06/2016  . History of stroke 05/06/2016  . Type 2 diabetes mellitus with hyperglycemia, with long-term current use of insulin ([place]) 12/04/2015  . History of shingles 09/08/2014  . Medically noncompliant 05/05/2014  . Leukocytosis 02/03/2014  . Chronic respiratory failure ([place]) 12/15/2013  . Immunization not carried out because of patient refusal 07/05/2012  . HLD (hyperlipidemia)   . Pulmonary nodule, right   . Allergic rhinitis   . ED (erectile dysfunction)   . Tobacco abuse   . COPD (chronic obstructive pulmonary disease) ([place]) 03/29/2010  . Essential hypertension 01/05/2007   Past Medical History:  Diagnosis Date  . Allergic rhinitis   . Asthma   . COPD (chronic obstructive pulmonary disease) ([place])   . Dermatitis seborrheica   . Diabetes mellitus type II   . ED (erectile dysfunction)   . Frozen shoulder   . HLD (hyperlipidemia)   . HTN (hypertension)   . Hyperkalemia   . Kidney stone   . Other diseases of lung, not elsewhere classified   . Pulmonary nodule, right  lower lobe  . Rotator cuff injury    right  . Shoulder pain   . Stroke [facility])    pt states "last year"  . Tobacco abuse    Past Surgical History:  Procedure Laterality Date  . CERVICAL DISCECTOMY  1998  . EEG    . ETT  11/1994  . TEE WITHOUT CARDIOVERSION  12/03/2011   Procedure: TRANSESOPHAGEAL ECHOCARDIOGRAM (TEE);  Surgeon: Lelon Perla, MD;  Location: [facility] ENDOSCOPY;  Service: Cardiovascular;  Laterality: N/A;  . TM repair     Social History   Tobacco Use  . Smoking status: Current Every Day Smoker    Packs/day: 1.00    Types: Cigarettes  . Smokeless tobacco: Never  Used  Substance Use Topics  . Alcohol use: No    Alcohol/week: 0.0 standard drinks    Comment: Quit 40 years ago  . Drug use: No   Family History  Problem Relation Age of Onset  . Asthma Sister   . Emphysema Sister   . Diabetes Mother   . Diabetes Brother   . Heart disease Brother    Allergies  Allergen Reactions  . Amoxicillin-Pot Clavulanate Other (See Comments)    Severe stomach pain.   . Quinapril Hcl     REACTION: back pain  . Valsartan     REACTION: back pain  . Varenicline Tartrate     REACTION: AMS, hallucinations, night mares   Current Outpatient Medications on File Prior to Visit  Medication Sig Dispense Refill  . albuterol (PROVENTIL) (2.5 MG/3ML) 0.083% nebulizer solution Take 3 mLs (2.5 mg total) every 4 (four) hours as needed by nebulization for shortness of breath. 75 mL 0  . albuterol (VENTOLIN HFA) 108 (90 Base) MCG/ACT inhaler Inhale 2 puffs every 6 (six) hours as needed into the lungs for wheezing. 1 Inhaler 0  . aspirin 81 MG tablet Take 1 tablet (81 mg total) by mouth daily. 30 tablet 0  . fluticasone furoate-vilanterol (BREO ELLIPTA) 100-25 MCG/INH AEPB Inhale 1 puff daily into the lungs. 60 each 0  . glucose blood (ACCU-CHEK GUIDE) test strip Use as instructed to test 4 times daily DX:E11.65 400 each 2  . Insulin Glargine (LANTUS) 100 UNIT/ML Solostar Pen Inject 28-30 Units into the skin daily with supper. 15 mL 11  . insulin lispro (HUMALOG) 100 UNIT/ML KiwkPen Inject 0.08-0.14 mLs (8-14 Units total) into the skin 3 (three) times daily. 15 mL 11  . Insulin Pen Needle (BD PEN NEEDLE NANO U/F) 32G X 4 MM MISC Use to inject insulin 4 times a day. 100 each 2  . Lancets Misc. (ACCU-CHEK FASTCLIX LANCET) KIT 1 each by Does not apply route QID. DX: E11.65 400 kit 0  . metFORMIN (GLUCOPHAGE) 1000 MG tablet Take 1,055m by mouth twice a day 180 tablet 3   No current facility-administered medications on file prior to visit.      Review of Systems   Constitutional: Negative for chills and fever.  HENT: Negative for congestion and sore throat.   Eyes: Negative for blurred vision, discharge and redness.  Respiratory: Positive for shortness of breath and wheezing. Negative for cough, hemoptysis and sputum production.   Cardiovascular: Negative for chest pain, palpitations and leg swelling.  Gastrointestinal: Negative for diarrhea, nausea and vomiting.  Skin: Negative for rash.  Neurological: Negative for dizziness and headaches.  Psychiatric/Behavioral: Negative for depression. The patient is not nervous/anxious.     Observations/Objective: Pt sounded well/in no distress  Baseline mildly hoarse voice Not  sob or coughing during interview Cognitively nl - but had a difficult time remembering what medicines he is on or understanding what they are for  Mood is good    Assessment and Plan: Problem List Items Addressed This Visit      Cardiovascular and Mediastinum   Essential hypertension - Primary    bp in fair control at this time  BP Readings from Last 1 Encounters:  12/24/18 129/80   No changes needed Most recent labs reviewed  Disc lifstyle change with low sodium diet and exercise  He will continue to monitor at home  Also have a visit to endocrinology with bp later this mo       Relevant Medications   amLODipine (NORVASC) 5 MG tablet   atorvastatin (LIPITOR) 10 MG tablet   Other Relevant Orders   CBC with Differential/Platelet   Comprehensive metabolic panel   Lipid panel   TSH     Respiratory   COPD (chronic obstructive pulmonary disease) (HCC)    Pt continues to smoke Remains sob on exertion  Also chronic resp failure Albuterol and breo -on med list -unsure if he uses compliantly  Enc strongly to quit smoking  Unfortunately continues to refuse immunizations for flu and pneumonia as well       Chronic respiratory failure ([place])    Due to severe copd Unfortunately continues to smoke Unsure if motivated to  totally quit  Enc strongly to consider         Endocrine   Type 2 diabetes mellitus with hyperglycemia, with long-term current use of insulin (HCC)    Pt continues to see Dr Cruzita Lederer  Has appt later this mo Lab Results  Component Value Date   HGBA1C 10.0 (A) 12/24/2018   Per pt -glucose levels are improved since last lantus adj in march  For upcoming appt  No longer on ace (? Why)  Taking statin  Continues to smoke Unsure if compliant with diet Cannot exercise due to copd      Relevant Medications   atorvastatin (LIPITOR) 10 MG tablet     Nervous and Auditory   Localization-related symptomatic epilepsy and epileptic syndromes with complex partial seizures, not intractable, without status epilepticus ([place])    No new occurances        Other   HLD (hyperlipidemia)    Will order lipids with next labs on 8/18 at [facility]  Disc goals for lipids and reasons to control them Rev last labs with pt Rev low sat fat diet in detail Diet is fair       Relevant Medications   amLODipine (NORVASC) 5 MG tablet   atorvastatin (LIPITOR) 10 MG tablet   Other Relevant Orders   Lipid panel   Tobacco abuse    Disc in detail risks of smoking and possible outcomes including copd, vascular/ heart disease, cancer , respiratory and sinus infections  Pt voices understanding Has cut down to 1/2 ppd approx  Not yet motivated to completely quit despite copd           Follow Up Instructions: Take all your bottles of medication to Dr Cruzita Lederer when you have your upcoming appointment so you can create a new medicine list   Call for your annual eye exam  Please consider quitting smoking   Take care of yourself   I will order labs for your visit to [facility]    I discussed the assessment and treatment plan with the patient. The patient was provided an opportunity  to ask questions and all were answered. The patient agreed with the plan and demonstrated an understanding of the instructions.   The  patient was advised to call back or seek an in-person evaluation if the symptoms worsen or if the condition fails to improve as anticipated.  I provided 23 minutes of non-face-to-face time during this encounter.   Loura Pardon, MD

## 2019-05-09 NOTE — Patient Instructions (Addendum)
Take all your bottles of medication to Dr Cruzita Lederer when you have your upcoming appointment so you can create a new medicine list   Call for your annual eye exam  Please consider quitting smoking   Take care of yourself   I will order labs for your visit to [facility]

## 2019-05-13 ENCOUNTER — Other Ambulatory Visit: Payer: Self-pay

## 2019-05-17 ENCOUNTER — Encounter: Payer: Self-pay | Admitting: Internal Medicine

## 2019-05-17 ENCOUNTER — Ambulatory Visit (INDEPENDENT_AMBULATORY_CARE_PROVIDER_SITE_OTHER): Payer: Medicare Other | Admitting: Internal Medicine

## 2019-05-17 ENCOUNTER — Other Ambulatory Visit: Payer: Self-pay

## 2019-05-17 VITALS — BP 120/60 | HR 90 | Temp 98.4°F | Ht 67.0 in | Wt 172.0 lb

## 2019-05-17 DIAGNOSIS — Z794 Long term (current) use of insulin: Secondary | ICD-10-CM | POA: Diagnosis not present

## 2019-05-17 DIAGNOSIS — E1165 Type 2 diabetes mellitus with hyperglycemia: Secondary | ICD-10-CM

## 2019-05-17 DIAGNOSIS — E785 Hyperlipidemia, unspecified: Secondary | ICD-10-CM

## 2019-05-17 DIAGNOSIS — E875 Hyperkalemia: Secondary | ICD-10-CM

## 2019-05-17 NOTE — Patient Instructions (Signed)
Please continue: - Lantus 28-30 units in the evening, around dinnertime - Metformin 1000 mg 2x a day  - Humalog 3x a day, 10-15 min before meals - 8 units before a smaller meal - 10 units before a regular meal - 12-14 units before a large meal  Please stop at the lab.  Please return in 4 months with your sugar log.

## 2019-05-17 NOTE — Progress Notes (Signed)
Patient ID: Jonathan [name], male   DOB: 10-21-1940, 78 y.o.   MRN: [id number]  Jonathan [name] is a 78 y.o.-year-old male, returning for follow-up for DM2, dx 1990, uncontrolled, insulin-dep since ~78, without complications (h/o stroke, also mild CKD). Last visit 5 months ago.  He is here with his wife who offers part of the history especially related to his blood sugars, activity level and medical history.  Last hemoglobin A1c was: 12/24/2018: HbA1c calculated from fructosamine 7.22%, slightly higher than before 08/24/2018: Hba1c calculated from fructosamine has improved to 6.8%. 04/23/2018: HbA1c calculated from fructosamine is higher, at 7.4%. 01/14/2018: HbA1c calculated from the fructosamine is better, at 6.7%. 10/16/2017: HbA1c calculated from the fructosamine is better, at 7.18%. 08/14/2017: HbA1c calculated from the fructosamine is 7.5% 05/14/2017: HbA1c calculated from the fructosamine is 7.4% 01/27/2017: HbA1c calculated from the fructosamine is great: 6.2% 10/30/2016: HbA1c calculated from the fructosamine is great, at 6.5% 07/31/2016: HbA1c calculated from the fructosamine is great, at 6.15% Lab Results  Component Value Date   HGBA1C 10.0 (A) 12/24/2018   HGBA1C 9.3 (A) 08/24/2018   HGBA1C 11 08/14/2017   He is on: - Lantus 28-30 units in the evening, around dinnertime - Metformin 1000 mg 2x a day  - Humalog 3x a day, 10-15 min before meals - 8 units before a smaller meal - 10 units before a regular meal - 12-14 units before a large meal  Checks sugars 3 times a day: - am: 89-203, 248 >> 170-260 >> 104-192, 219 >> 120-349 >> 73, 86-187, 205 - 2h after b'fast: n/c  - lunch: 146-261, 348 >> 127-205, 220 >> 96-190, 227, 270 >> 110-182, 192 - 2h after lunch: n/c  - dinner:  171-297 >> 187-251 >> 108-194 >> 90-180, 200 >> 107-195, 210 - 2h after dinner: 139-280, 373 >> 173-284  >> n/c - bedtime: n/c Lowest  146 >> 104 >> 90 >> 73 Highest  348 >> 220 >> 349 >> 210.  +  Stage II-III CKD, last BUN/creatinine: Lab Results  Component Value Date   BUN 13 12/27/2018   CREATININE 1.18 12/27/2018   Lab Results  Component Value Date   GFR 59.62 (L) 12/27/2018   MICRALBCREAT 7.6 09/15/2016  He is allergic to ACE inhibitor/ARB's.  ACR levels were normal: Lab Results  Component Value Date   MICRALBCREAT 7.6 09/15/2016   MICRALBCREAT 12.7 04/28/2014   MICRALBCREAT 25.3 12/17/2009   MICRALBCREAT 22.6 02/13/2009   + HL: Lab Results  Component Value Date   CHOL 196 12/24/2018   HDL 42.10 12/24/2018   LDLCALC 58 09/01/2014   LDLDIRECT 118.0 12/24/2018   TRIG 289.0 (H) 12/24/2018   CHOLHDL 5 12/24/2018  On Lipitor. - Last eye exam was in 03/2018: No DR, + cataract -He denies numbness and tingling in feet.  At last visit, his potassium was high, but normalized at last check: Lab Results  Component Value Date   K 4.7 12/27/2018   K 5.7 (H) 12/24/2018   K 4.5 12/12/2017   K 4.6 12/11/2017   K 4.8 12/10/2017   He still smokes 1 PPD.  He is too short of breath or any type of activity.  He is also unstable on his feet because of back pain.  Around early 2015, he was hospitalized after syncope, was in ICU, was on assisted ventilation, expressive aphasia recovered- believed to have a stroke, now better.  His wife has stage III lung cancer.  ROS: Constitutional: no weight gain/no weight loss, +  fatigue, no subjective hyperthermia, no subjective hypothermia Eyes: no blurry vision, no xerophthalmia ENT: no sore throat, no nodules palpated in neck, no dysphagia, no odynophagia, no hoarseness Cardiovascular: no CP/+SOB/no palpitations/no leg swelling Respiratory: no cough/+ SOB/+ wheezing Gastrointestinal: no N/no V/no D/no C/no acid reflux Musculoskeletal: no muscle aches/no joint aches Skin: no rashes, no hair loss Neurological: no tremors/no numbness/no tingling/no dizziness  I reviewed pt's medications, allergies, PMH, social hx, family hx, and  changes were documented in the history of present illness. Otherwise, unchanged from my initial visit note.  Past Medical History:  Diagnosis Date  . Allergic rhinitis   . Asthma   . COPD (chronic obstructive pulmonary disease) ([place])   . Dermatitis seborrheica   . Diabetes mellitus type II   . ED (erectile dysfunction)   . Frozen shoulder   . HLD (hyperlipidemia)   . HTN (hypertension)   . Hyperkalemia   . Kidney stone   . Other diseases of lung, not elsewhere classified   . Pulmonary nodule, right    lower lobe  . Rotator cuff injury    right  . Shoulder pain   . Stroke [facility])    pt states "last year"  . Tobacco abuse    Past Surgical History:  Procedure Laterality Date  . CERVICAL DISCECTOMY  1998  . EEG    . ETT  11/1994  . TEE WITHOUT CARDIOVERSION  12/03/2011   Procedure: TRANSESOPHAGEAL ECHOCARDIOGRAM (TEE);  Surgeon: Lelon Perla, MD;  Location: [facility] ENDOSCOPY;  Service: Cardiovascular;  Laterality: N/A;  . TM repair     Social History   Socioeconomic History  . Marital status: Married    Spouse name: Not on file  . Number of children: Not on file  . Years of education: Not on file  . Highest education level: Not on file  Occupational History  . Occupation: Retired    Comment: Air cabin crew  Social Needs  . Financial resource strain: Not on file  . Food insecurity    Worry: Not on file    Inability: Not on file  . Transportation needs    Medical: Not on file    Non-medical: Not on file  Tobacco Use  . Smoking status: Current Every Day Smoker    Packs/day: 1.00    Types: Cigarettes  . Smokeless tobacco: Never Used  Substance and Sexual Activity  . Alcohol use: No    Alcohol/week: 0.0 standard drinks    Comment: Quit 40 years ago  . Drug use: No  . Sexual activity: Never    Partners: Female  Lifestyle  . Physical activity    Days per week: Not on file    Minutes per session: Not on file  . Stress: Not on file  Relationships  . Social  Herbalist on phone: Not on file    Gets together: Not on file    Attends religious service: Not on file    Active member of club or organization: Not on file    Attends meetings of clubs or organizations: Not on file    Relationship status: Not on file  . Intimate partner violence    Fear of current or ex partner: Not on file    Emotionally abused: Not on file    Physically abused: Not on file    Forced sexual activity: Not on file  Other Topics Concern  . Not on file  Social History Narrative  . Not on file  Current Outpatient Medications on File Prior to Visit  Medication Sig Dispense Refill  . albuterol (PROVENTIL) (2.5 MG/3ML) 0.083% nebulizer solution Take 3 mLs (2.5 mg total) every 4 (four) hours as needed by nebulization for shortness of breath. 75 mL 0  . albuterol (VENTOLIN HFA) 108 (90 Base) MCG/ACT inhaler Inhale 2 puffs every 6 (six) hours as needed into the lungs for wheezing. 1 Inhaler 0  . amLODipine (NORVASC) 5 MG tablet Take 1 tablet (5 mg total) by mouth daily. 90 tablet 3  . aspirin 81 MG tablet Take 1 tablet (81 mg total) by mouth daily. 30 tablet 0  . atorvastatin (LIPITOR) 10 MG tablet Take 1 tablet (10 mg total) by mouth daily. 90 tablet 3  . fluticasone furoate-vilanterol (BREO ELLIPTA) 100-25 MCG/INH AEPB Inhale 1 puff daily into the lungs. 60 each 0  . glucose blood (ACCU-CHEK GUIDE) test strip Use as instructed to test 4 times daily DX:E11.65 400 each 2  . Insulin Glargine (LANTUS) 100 UNIT/ML Solostar Pen Inject 28-30 Units into the skin daily with supper. 15 mL 11  . insulin lispro (HUMALOG) 100 UNIT/ML KiwkPen Inject 0.08-0.14 mLs (8-14 Units total) into the skin 3 (three) times daily. 15 mL 11  . Insulin Pen Needle (BD PEN NEEDLE NANO U/F) 32G X 4 MM MISC Use to inject insulin 4 times a day. 100 each 2  . Lancets Misc. (ACCU-CHEK FASTCLIX LANCET) KIT 1 each by Does not apply route QID. DX: E11.65 400 kit 0  . metFORMIN (GLUCOPHAGE) 1000 MG  tablet Take 1,067m by mouth twice a day 180 tablet 3   No current facility-administered medications on file prior to visit.    Allergies  Allergen Reactions  . Amoxicillin-Pot Clavulanate Other (See Comments)    Severe stomach pain.   . Quinapril Hcl     REACTION: back pain  . Valsartan     REACTION: back pain  . Varenicline Tartrate     REACTION: AMS, hallucinations, night mares   Family History  Problem Relation Age of Onset  . Asthma Sister   . Emphysema Sister   . Diabetes Mother   . Diabetes Brother   . Heart disease Brother    PE: BP 120/60   Pulse 90   Ht 5' 7"  (1.702 m)   Wt 172 lb (78 kg)   SpO2 94%   BMI 26.94 kg/m  Body mass index is 26.94 kg/m. Wt Readings from Last 3 Encounters:  05/17/19 172 lb (78 kg)  12/24/18 178 lb (80.7 kg)  08/24/18 180 lb (81.6 kg)   Constitutional: overweight, in NAD Eyes: PERRLA, EOMI, no exophthalmos ENT: moist mucous membranes, no thyromegaly, no cervical lymphadenopathy Cardiovascular: RRR, No MRG Respiratory: CTA B Gastrointestinal: abdomen soft, NT, ND, BS+ Musculoskeletal: no deformities, strength intact in all 4 Skin: moist, warm, no rashes Neurological: no tremor with outstretched hands, DTR normal in all 4  ASSESSMENT: 1. DM2, insulin-dependent, uncontrolled, with complications - Cerebrovascular disease-h/o stroke  2. HL  3. Smoking  PLAN:  1. Patient with longstanding, uncontrolled, type 2 diabetes, on basal/bolus insulin regimen and also metformin.  He was initially noncompliant with his insulin doses but he does a better job now.  His fructosamine level improved, with latest HbA1c calculated from fructosamine at 7.22%.  At last visit, we did not change his regimen as he was still missing some Lantus doses approximately twice a week.  He is doing better now and his sugars have improved further.  They are not  all at goal, but he does not have any more sugars in the 300s and only very few in the 200s.  I advised  him to continue the current regimen. -I also advised him to start moving more, as now he is very inactive.  However, he is unsteady on his feet and he is afraid that he may fall. - I suggested to:  Patient Instructions  Please continue: - Lantus 28-30 units in the evening, around dinnertime - Metformin 1000 mg 2x a day  - Humalog 3x a day, 10-15 min before meals - 8 units before a smaller meal - 10 units before a regular meal - 12-14 units before a large meal  Please stop at the lab.  Please return in 4 months with your sugar log.   - we we will check a fructosamine level today - advised to check sugars at different times of the day - 3x a day, rotating check times - advised for yearly eye exams >> he is not UTD - refuses vaccines! - return to clinic in 4 months     2.  Hyperlipidemia - Reviewed latest lipid panel from 11/2018: LDL above goal, triglycerides also high, HDL normal Lab Results  Component Value Date   CHOL 196 12/24/2018   HDL 42.10 12/24/2018   LDLCALC 58 09/01/2014   LDLDIRECT 118.0 12/24/2018   TRIG 289.0 (H) 12/24/2018   CHOLHDL 5 12/24/2018  - Continues Lipitor without side effects.  3.  Hyperkalemia -High potassium level at last visit -This was rechecked and the latest level was normal -We will repeat a level today  Component     Latest Ref Rng & Units 05/17/2019          Glucose     65 - 99 mg/dL 201 (H)  BUN     7 - 25 mg/dL 13  Creatinine     0.70 - 1.18 mg/dL 1.09  GFR, Est Non African American     > OR = 60 mL/min/1.44m 65  GFR, Est African American     > OR = 60 mL/min/1.729m75  BUN/Creatinine Ratio     6 - 22 (calc) NOT APPLICABLE  Sodium     13832 146 mmol/L 134 (L)  Potassium     3.5 - 5.3 mmol/L 5.0  Chloride     98 - 110 mmol/L 97 (L)  CO2     20 - 32 mmol/L 27  Calcium     8.6 - 10.3 mg/dL 9.3  Fructosamine     205 - 285 umol/L 286 (H)   HbA1c calculated from fructosamine: 6.47%, improved.  CrPhilemon KingdomMD  PhD [facility]

## 2019-05-19 LAB — BASIC METABOLIC PANEL WITH GFR
BUN: 13 mg/dL (ref 7–25)
CO2: 27 mmol/L (ref 20–32)
Calcium: 9.3 mg/dL (ref 8.6–10.3)
Chloride: 97 mmol/L — ABNORMAL LOW (ref 98–110)
Creat: 1.09 mg/dL (ref 0.70–1.18)
GFR, Est African American: 75 mL/min/{1.73_m2} (ref >=60)
GFR, Est Non African American: 65 mL/min/{1.73_m2} (ref >=60)
Glucose, Bld: 201 mg/dL — ABNORMAL HIGH (ref 65–99)
Potassium: 5 mmol/L (ref 3.5–5.3)
Sodium: 134 mmol/L — ABNORMAL LOW (ref 135–146)

## 2019-05-19 LAB — FRUCTOSAMINE: Fructosamine: 286 umol/L — ABNORMAL HIGH (ref 205–285)

## 2019-06-26 ENCOUNTER — Other Ambulatory Visit: Payer: Self-pay | Admitting: Internal Medicine

## 2019-09-06 ENCOUNTER — Other Ambulatory Visit: Payer: Self-pay | Admitting: Internal Medicine

## 2019-09-06 DIAGNOSIS — E1165 Type 2 diabetes mellitus with hyperglycemia: Secondary | ICD-10-CM

## 2019-09-06 DIAGNOSIS — Z794 Long term (current) use of insulin: Secondary | ICD-10-CM

## 2019-09-12 ENCOUNTER — Other Ambulatory Visit: Payer: Self-pay

## 2019-09-13 ENCOUNTER — Encounter: Payer: Self-pay | Admitting: Internal Medicine

## 2019-09-13 ENCOUNTER — Ambulatory Visit (INDEPENDENT_AMBULATORY_CARE_PROVIDER_SITE_OTHER): Payer: Medicare Other | Admitting: Internal Medicine

## 2019-09-13 VITALS — BP 142/70 | HR 102 | Ht 67.0 in | Wt 162.0 lb

## 2019-09-13 DIAGNOSIS — E1165 Type 2 diabetes mellitus with hyperglycemia: Secondary | ICD-10-CM

## 2019-09-13 DIAGNOSIS — Z794 Long term (current) use of insulin: Secondary | ICD-10-CM | POA: Diagnosis not present

## 2019-09-13 DIAGNOSIS — E875 Hyperkalemia: Secondary | ICD-10-CM | POA: Diagnosis not present

## 2019-09-13 DIAGNOSIS — E785 Hyperlipidemia, unspecified: Secondary | ICD-10-CM

## 2019-09-13 LAB — POCT GLYCOSYLATED HEMOGLOBIN (HGB A1C): Hemoglobin A1C: 10 % — AB (ref 4.0–5.6)

## 2019-09-13 NOTE — Addendum Note (Signed)
Addended by: Cardell Peach I on: 09/13/2019 03:40 PM   Modules accepted: Orders

## 2019-09-13 NOTE — Progress Notes (Addendum)
Patient ID: Jonathan [name], male   DOB: 1941/03/10, 78 y.o.   MRN: [id number]  This visit occurred during the SARS-CoV-2 public health emergency.  Safety protocols were in place, including screening questions prior to the visit, additional usage of staff PPE, and extensive cleaning of exam room while observing appropriate contact time as indicated for disinfecting solutions.    Jonathan [name] is a 78 y.o.-year-old male, returning for follow-up for DM2, dx 1990, uncontrolled, insulin-dep since ~8891, without complications (h/o stroke, also mild CKD). Last visit 4 months ago.   Last hemoglobin A1c was: 05/17/2019: HbA1c calculated from fructosamine: 6.47%, improved. 12/24/2018: HbA1c calculated from fructosamine 7.22%, slightly higher than before 08/24/2018: Hba1c calculated from fructosamine has improved to 6.8%. 04/23/2018: HbA1c calculated from fructosamine is higher, at 7.4%. 01/14/2018: HbA1c calculated from the fructosamine is better, at 6.7%. 10/16/2017: HbA1c calculated from the fructosamine is better, at 7.18%. 08/14/2017: HbA1c calculated from the fructosamine is 7.5% 05/14/2017: HbA1c calculated from the fructosamine is 7.4% 01/27/2017: HbA1c calculated from the fructosamine is great: 6.2% 10/30/2016: HbA1c calculated from the fructosamine is great, at 6.5% 07/31/2016: HbA1c calculated from the fructosamine is great, at 6.15% Lab Results  Component Value Date   HGBA1C 10.0 (A) 12/24/2018   HGBA1C 9.3 (A) 08/24/2018   HGBA1C 11 08/14/2017   He is on: - Lantus 28-30 units in the evening, around dinnertime - Metformin 1000 mg 2x a day  - Humalog 3x a day, 10-15 min before meals - 8 units before a smaller meal - 10 units before a regular meal - 12-14 units before a large meal  Checks sugars 3 times a day: - am: 120-349 >> 73, 86-187, 205 >> 120-190, 205 (he is up all night - snack at 3 am) - 2h after b'fast: n/c  - lunch: 96-190, 227, 270 >> 110-182, 192 >> 91, 100-190, 200,  215 - 2h after lunch: n/c  - dinner: 108-194 >> 90-180, 200 >> 107-195, 210  - 2h after dinner: 139-280, 373 >> 173-284  >> n/c >> 80, 137-194, 201 - bedtime: n/c Lowest  90 >> 73 >> 80 Highest  349 >> 210 >> 215  + Stage II-III CKD, last BUN/creatinine: Lab Results  Component Value Date   BUN 13 05/17/2019   CREATININE 1.09 05/17/2019   Lab Results  Component Value Date   GFR 59.62 (L) 12/27/2018   MICRALBCREAT 7.6 09/15/2016  He is allergic to ACE inhibitor/ARB's.  ACR level was normal: Lab Results  Component Value Date   MICRALBCREAT 7.6 09/15/2016   MICRALBCREAT 12.7 04/28/2014   MICRALBCREAT 25.3 12/17/2009   MICRALBCREAT 22.6 02/13/2009   + HL: Lab Results  Component Value Date   CHOL 196 12/24/2018   HDL 42.10 12/24/2018   LDLCALC 58 09/01/2014   LDLDIRECT 118.0 12/24/2018   TRIG 289.0 (H) 12/24/2018   CHOLHDL 5 12/24/2018  On Lipitor. - Last eye exam was in 03/2018: No DR, + cataract. -No numbness and tingling in feet.  He had a high potassium at the previous visit, at last visit this was normal: Lab Results  Component Value Date   K 5.0 05/17/2019   K 4.7 12/27/2018   K 5.7 (H) 12/24/2018   K 4.5 12/12/2017   K 4.6 12/11/2017   He still smokes 1/2-3/4 PPD.  He is too short of breath for any type of activity and also has back pain so is unstable on his feet.  He is in a wheelchair.  Around early 2015, he  was hospitalized after syncope, was in ICU, was on assisted ventilation, expressive aphasia recovered- believed to have a stroke, now better.  His wife has stage III lung cancer.  ROS: Constitutional: no weight gain/no weight loss, + fatigue, no subjective hyperthermia, no subjective hypothermia Eyes: no blurry vision, no xerophthalmia ENT: no sore throat, no nodules palpated in neck, no dysphagia, no odynophagia, no hoarseness Cardiovascular: no CP/+ SOB/no palpitations/no leg swelling Respiratory: no cough/+ SOB/+ wheezing Gastrointestinal: no  N/no V/no D/no C/no acid reflux Musculoskeletal: + muscle aches/+ joint aches Skin: no rashes, no hair loss Neurological: no tremors/no numbness/no tingling/no dizziness, + disequilibrium  I reviewed pt's medications, allergies, PMH, social hx, family hx, and changes were documented in the history of present illness. Otherwise, unchanged from my initial visit note.  Past Medical History:  Diagnosis Date  . Allergic rhinitis   . Asthma   . COPD (chronic obstructive pulmonary disease) ([place])   . Dermatitis seborrheica   . Diabetes mellitus type II   . ED (erectile dysfunction)   . Frozen shoulder   . HLD (hyperlipidemia)   . HTN (hypertension)   . Hyperkalemia   . Kidney stone   . Other diseases of lung, not elsewhere classified   . Pulmonary nodule, right    lower lobe  . Rotator cuff injury    right  . Shoulder pain   . Stroke [facility])    pt states "last year"  . Tobacco abuse    Past Surgical History:  Procedure Laterality Date  . CERVICAL DISCECTOMY  1998  . EEG    . ETT  11/1994  . TEE WITHOUT CARDIOVERSION  12/03/2011   Procedure: TRANSESOPHAGEAL ECHOCARDIOGRAM (TEE);  Surgeon: Lelon Perla, MD;  Location: [facility] ENDOSCOPY;  Service: Cardiovascular;  Laterality: N/A;  . TM repair     Social History   Socioeconomic History  . Marital status: Married    Spouse name: Not on file  . Number of children: Not on file  . Years of education: Not on file  . Highest education level: Not on file  Occupational History  . Occupation: Retired    Comment: Air cabin crew  Tobacco Use  . Smoking status: Current Every Day Smoker    Packs/day: 1.00    Types: Cigarettes  . Smokeless tobacco: Never Used  Substance and Sexual Activity  . Alcohol use: No    Alcohol/week: 0.0 standard drinks    Comment: Quit 40 years ago  . Drug use: No  . Sexual activity: Never    Partners: Female  Other Topics Concern  . Not on file  Social History Narrative  . Not on file   Social  Determinants of Health   Financial Resource Strain:   . Difficulty of Paying Living Expenses: Not on file  Food Insecurity:   . Worried About Charity fundraiser in the Last Year: Not on file  . Ran Out of Food in the Last Year: Not on file  Transportation Needs:   . Lack of Transportation (Medical): Not on file  . Lack of Transportation (Non-Medical): Not on file  Physical Activity:   . Days of Exercise per Week: Not on file  . Minutes of Exercise per Session: Not on file  Stress:   . Feeling of Stress : Not on file  Social Connections:   . Frequency of Communication with Friends and Family: Not on file  . Frequency of Social Gatherings with Friends and Family: Not on file  . Attends  Religious Services: Not on file  . Active Member of Clubs or Organizations: Not on file  . Attends Archivist Meetings: Not on file  . Marital Status: Not on file  Intimate Partner Violence:   . Fear of Current or Ex-Partner: Not on file  . Emotionally Abused: Not on file  . Physically Abused: Not on file  . Sexually Abused: Not on file   Current Outpatient Medications on File Prior to Visit  Medication Sig Dispense Refill  . ACCU-CHEK GUIDE test strip USE AS INSTRUCTED TO TEST 4 TIMES DAILY DX:E11.65 100 strip 11  . albuterol (PROVENTIL) (2.5 MG/3ML) 0.083% nebulizer solution Take 3 mLs (2.5 mg total) every 4 (four) hours as needed by nebulization for shortness of breath. 75 mL 0  . albuterol (VENTOLIN HFA) 108 (90 Base) MCG/ACT inhaler Inhale 2 puffs every 6 (six) hours as needed into the lungs for wheezing. 1 Inhaler 0  . amLODipine (NORVASC) 5 MG tablet Take 1 tablet (5 mg total) by mouth daily. 90 tablet 3  . aspirin 81 MG tablet Take 1 tablet (81 mg total) by mouth daily. 30 tablet 0  . atorvastatin (LIPITOR) 10 MG tablet Take 1 tablet (10 mg total) by mouth daily. 90 tablet 3  . fluticasone furoate-vilanterol (BREO ELLIPTA) 100-25 MCG/INH AEPB Inhale 1 puff daily into the lungs. 60  each 0  . HUMALOG KWIKPEN 100 UNIT/ML KwikPen INJECT 8-14 UNITS INTO THE SKIN 3 TIMES DAILY. 15 mL 4  . Insulin Glargine (LANTUS) 100 UNIT/ML Solostar Pen Inject 28-30 Units into the skin daily with supper. 15 mL 11  . Insulin Pen Needle (BD PEN NEEDLE NANO U/F) 32G X 4 MM MISC Use to inject insulin 4 times a day. 100 each 2  . Lancets Misc. (ACCU-CHEK FASTCLIX LANCET) KIT 1 each by Does not apply route QID. DX: E11.65 400 kit 0  . metFORMIN (GLUCOPHAGE) 1000 MG tablet Take 1,061m by mouth twice a day 180 tablet 3   No current facility-administered medications on file prior to visit.   Allergies  Allergen Reactions  . Amoxicillin-Pot Clavulanate Other (See Comments)    Severe stomach pain.   . Quinapril Hcl     REACTION: back pain  . Valsartan     REACTION: back pain  . Varenicline Tartrate     REACTION: AMS, hallucinations, night mares   Family History  Problem Relation Age of Onset  . Asthma Sister   . Emphysema Sister   . Diabetes Mother   . Diabetes Brother   . Heart disease Brother    PE: BP (!) 142/70   Pulse (!) 102   Ht 5' 7"  (1.702 m)   Wt 162 lb (73.5 kg)   SpO2 91%   BMI 25.37 kg/m  Body mass index is 25.37 kg/m. Wt Readings from Last 3 Encounters:  09/13/19 162 lb (73.5 kg)  05/17/19 172 lb (78 kg)  12/24/18 178 lb (80.7 kg)   Constitutional: overweight, in NAD in wheelchair Eyes: PERRLA, EOMI, no exophthalmos ENT: moist mucous membranes, no thyromegaly, no cervical lymphadenopathy Cardiovascular: Tachycardia RR, No MRG Respiratory: CTA B Gastrointestinal: abdomen soft, NT, ND, BS+ Musculoskeletal: no deformities, strength intact in all 4 Skin: moist, warm, no rashes Neurological: no tremor with outstretched hands, DTR normal in all 4  ASSESSMENT: 1. DM2, insulin-dependent, uncontrolled, with complications - Cerebrovascular disease-h/o stroke  2. HL  3.  Hyperkalemia  PLAN:  1. Patient with longstanding, uncontrolled, type 2 diabetes, on  basal-bolus insulin regimen  and Metformin.  He was initially noncompliant with his insulin doses and sugar checks but he started to do a better job and his HbA1c levels improved significantly.  We are usually follow him by fructosamine's since his HbA1c levels are not very accurate for him.  At last visit, we did not change his regimen since most sugars were at or close to goal.  We also discussed about moving more as he was very inactive. -At this visit he is wondering whether Lantus is working for him and I assured him that it is.  I advised him to continue.  He wakes up with sugars higher than target in the morning most likely due to his snack at night.  He mentions that he worked nights for many years and as a consequence he cannot sleep during the night now.  Around 3 AM he had a snack, which she is not covering with insulin.  We discussed about covering this with 5 to 6 units of Humalog.  He will try to do this.  Otherwise, I do not feel he needs changes in his regimen.  Sugars are approximately the same as before before, with only occasional spikes.  I feel that improving his morning sugars will help with sugars later in the day. - I suggested to:  Patient Instructions  Please continue: - Lantus 28-30 units in the evening, around dinnertime - Metformin 1000 mg 2x a day  - Humalog 3x a day, 10-15 min before meals - 8 units before a smaller meal - 10 units before a regular meal - 12-14 units before a large meal  Please take 5-6 units of Humalog before the snack at night.  Please stop at the lab.  Please return in 4 months with your sugar log.    - we checked his HbA1c: 10%, however, we will also check a fructosamine level since the directly measured HbA1c levels are not very accurate for him - advised to check sugars at different times of the day - 4x a day, rotating check times - advised for yearly eye exams >> he is not UTD - refuses vaccines - return to clinic in 3-4 months    2.   Hyperlipidemia -Reviewed latest lipid panel from 11/2018: LDL above goal, triglycerides also high, HDL at goal Lab Results  Component Value Date   CHOL 196 12/24/2018   HDL 42.10 12/24/2018   LDLCALC 58 09/01/2014   LDLDIRECT 118.0 12/24/2018   TRIG 289.0 (H) 12/24/2018   CHOLHDL 5 12/24/2018  -Continues Lipitor without side effects.  3.  Hyperkalemia -He had a high potassium level in 11/2017 -Reviewed his latest potassium level from last visit that this was normal  Office Visit on 09/13/2019  Component Date Value Ref Range Status  . Fructosamine 09/13/2019 318* 205 - 285 umol/L Final  . Hemoglobin A1C 09/13/2019 10.0* 4.0 - 5.6 % Final   09/14/2019: HbA1c calculated from fructosamine is 7%.  Philemon Kingdom, MD PhD [facility] Endocrinology

## 2019-09-13 NOTE — Patient Instructions (Addendum)
Please continue: - Lantus 28-30 units in the evening, around dinnertime - Metformin 1000 mg 2x a day  - Humalog 3x a day, 10-15 min before meals - 8 units before a smaller meal - 10 units before a regular meal - 12-14 units before a large meal  Please take 5-6 units of Humalog before the snack at night.  Please stop at the lab.  Please return in 4 months with your sugar log.

## 2019-09-19 LAB — FRUCTOSAMINE: Fructosamine: 318 umol/L — ABNORMAL HIGH (ref 205–285)

## 2020-01-10 ENCOUNTER — Other Ambulatory Visit: Payer: Self-pay

## 2020-01-12 ENCOUNTER — Other Ambulatory Visit: Payer: Self-pay

## 2020-01-12 ENCOUNTER — Ambulatory Visit (INDEPENDENT_AMBULATORY_CARE_PROVIDER_SITE_OTHER): Payer: Medicare Other | Admitting: Internal Medicine

## 2020-01-12 ENCOUNTER — Encounter: Payer: Self-pay | Admitting: Internal Medicine

## 2020-01-12 VITALS — BP 122/60 | HR 93 | Ht 67.0 in | Wt 155.0 lb

## 2020-01-12 DIAGNOSIS — E785 Hyperlipidemia, unspecified: Secondary | ICD-10-CM | POA: Diagnosis not present

## 2020-01-12 DIAGNOSIS — E1165 Type 2 diabetes mellitus with hyperglycemia: Secondary | ICD-10-CM

## 2020-01-12 DIAGNOSIS — Z794 Long term (current) use of insulin: Secondary | ICD-10-CM

## 2020-01-12 NOTE — Patient Instructions (Signed)
Please continue: - Metformin 1000 mg 2x a day   Please increase: - Lantus to 35 units in the evening, around dinnertime - Humalog 3x a day, 10-15 min before meals - 10 units before a smaller meal - 12-14 units before a regular meal - 16 units before a large meal Please take 5-6 units of Humalog before the snack at night.  Please stop at the lab.  Please return in 4 months with your sugar log.

## 2020-01-12 NOTE — Progress Notes (Signed)
Patient ID: Jonathan [name], male   DOB: 03-09-1941, 79 y.o.   MRN: [id number]  This visit occurred during the SARS-CoV-2 public health emergency.  Safety protocols were in place, including screening questions prior to the visit, additional usage of staff PPE, and extensive cleaning of exam room while observing appropriate contact time as indicated for disinfecting solutions.   Jonathan [name] is a 79 y.o.-year-old male, returning for follow-up for DM2, dx 1990, uncontrolled, insulin-dep since ~4287, without complications (h/o stroke, also mild CKD). Last visit 4 months ago.  Reviewed HbA1c levels: 09/13/2019: HbA1c calculated from fructosamine: 7% Lab Results  Component Value Date   HGBA1C 10.0 (A) 09/13/2019   HGBA1C 10.0 (A) 12/24/2018   HGBA1C 9.3 (A) 08/24/2018  05/17/2019: HbA1c calculated from fructosamine: 6.47%, improved. 12/24/2018: HbA1c calculated from fructosamine 7.22%, slightly higher than before 08/24/2018: Hba1c calculated from fructosamine has improved to 6.8%. 04/23/2018: HbA1c calculated from fructosamine is higher, at 7.4%. 01/14/2018: HbA1c calculated from the fructosamine is better, at 6.7%. 10/16/2017: HbA1c calculated from the fructosamine is better, at 7.18%. 08/14/2017: HbA1c calculated from the fructosamine is 7.5% 05/14/2017: HbA1c calculated from the fructosamine is 7.4% 01/27/2017: HbA1c calculated from the fructosamine is great: 6.2% 10/30/2016: HbA1c calculated from the fructosamine is great, at 6.5% 07/31/2016: HbA1c calculated from the fructosamine is great, at 6.15%  He is on: - Lantus 28-30 units in the evening, around dinnertime - Metformin 1000 mg 2x a day  - Humalog 3x a day, 10-15 min before meals - 8 units before a smaller meal - 10 units before a regular meal - 12-14 units before a large meal - 5 to 6 units before snack-added 08/2019   Checks sugars 3 times a day: - am:  120-190, 205 (he is up all night - snack at 3 am) >> 140, 174-282, 336 -  2h after b'fast: n/c  - lunch: 110-182, 192 >> 91, 100-190, 200, 215 >> 212-321 - 2h after lunch: n/c  - dinner: 108-194 >> 90-180, 200 >> 107-195, 210  - 2h after dinner: 173-284  >> n/c >> 80, 137-194, 201 >> 212-289 - bedtime: n/c Lowest  90 >> 73 >> 80 >> 140 Highest  349 >> 210 >> 215 >> 336  + Stage II-III CKD, last BUN/creatinine: Lab Results  Component Value Date   BUN 13 05/17/2019   CREATININE 1.09 05/17/2019   Lab Results  Component Value Date   GFR 59.62 (L) 12/27/2018   MICRALBCREAT 7.6 09/15/2016  He Is allergic to ACE inhibitor/ARBs.  ACR levels normal: Lab Results  Component Value Date   MICRALBCREAT 7.6 09/15/2016   MICRALBCREAT 12.7 04/28/2014   MICRALBCREAT 25.3 12/17/2009   MICRALBCREAT 22.6 02/13/2009   + HL: Lab Results  Component Value Date   CHOL 196 12/24/2018   HDL 42.10 12/24/2018   LDLCALC 58 09/01/2014   LDLDIRECT 118.0 12/24/2018   TRIG 289.0 (H) 12/24/2018   CHOLHDL 5 12/24/2018  On Lipitor. - Last eye exam was in 03/2018: No DR, + cataract - no numbness and tingling in feet.  He has a history of hypothyroidism x1, afterwards, levels normalized: Lab Results  Component Value Date   K 5.0 05/17/2019   K 4.7 12/27/2018   K 5.7 (H) 12/24/2018   K 4.5 12/12/2017   K 4.6 12/11/2017   He still smokes 1/2-3/4 PPD.  He is too short of breath for any type of activity and also has back pain so is unstable on his feet.  He is  in a wheelchair.  Around early 2015, he was hospitalized after syncope, was in ICU, was on assisted ventilation, expressive aphasia recovered- believed to have a stroke, now better.  His wife has stage III lung cancer.  ROS: Constitutional: no weight gain/+ weight loss, + fatigue, no subjective hyperthermia, no subjective hypothermia Eyes: no blurry vision, no xerophthalmia ENT: no sore throat, no nodules palpated in neck, no dysphagia, no odynophagia, no hoarseness Cardiovascular: no CP/+ SOB/no palpitations/no leg  swelling Respiratory: no cough/+ SOB/no wheezing Gastrointestinal: no N/no V/no D/no C/no acid reflux Musculoskeletal: + Muscle aches/+ joint aches Skin: no rashes, no hair loss Neurological: no tremors/no numbness/no tingling/no dizziness, + imbalance  I reviewed pt's medications, allergies, PMH, social hx, family hx, and changes were documented in the history of present illness. Otherwise, unchanged from my initial visit note.   Past Medical History:  Diagnosis Date  . Allergic rhinitis   . Asthma   . COPD (chronic obstructive pulmonary disease) ([place])   . Dermatitis seborrheica   . Diabetes mellitus type II   . ED (erectile dysfunction)   . Frozen shoulder   . HLD (hyperlipidemia)   . HTN (hypertension)   . Hyperkalemia   . Kidney stone   . Other diseases of lung, not elsewhere classified   . Pulmonary nodule, right    lower lobe  . Rotator cuff injury    right  . Shoulder pain   . Stroke [facility])    pt states "last year"  . Tobacco abuse    Past Surgical History:  Procedure Laterality Date  . CERVICAL DISCECTOMY  1998  . EEG    . ETT  11/1994  . TEE WITHOUT CARDIOVERSION  12/03/2011   Procedure: TRANSESOPHAGEAL ECHOCARDIOGRAM (TEE);  Surgeon: Lelon Perla, MD;  Location: [facility] ENDOSCOPY;  Service: Cardiovascular;  Laterality: N/A;  . TM repair     Social History   Socioeconomic History  . Marital status: Married    Spouse name: Not on file  . Number of children: Not on file  . Years of education: Not on file  . Highest education level: Not on file  Occupational History  . Occupation: Retired    Comment: Air cabin crew  Tobacco Use  . Smoking status: Current Every Day Smoker    Packs/day: 1.00    Types: Cigarettes  . Smokeless tobacco: Never Used  Substance and Sexual Activity  . Alcohol use: No    Alcohol/week: 0.0 standard drinks    Comment: Quit 40 years ago  . Drug use: No  . Sexual activity: Never    Partners: Female  Other Topics Concern  . Not on  file  Social History Narrative  . Not on file   Social Determinants of Health   Financial Resource Strain:   . Difficulty of Paying Living Expenses:   Food Insecurity:   . Worried About Charity fundraiser in the Last Year:   . Arboriculturist in the Last Year:   Transportation Needs:   . Film/video editor (Medical):   [name] Lack of Transportation (Non-Medical):   Physical Activity:   . Days of Exercise per Week:   . Minutes of Exercise per Session:   Stress:   . Feeling of Stress :   Social Connections:   . Frequency of Communication with Friends and Family:   . Frequency of Social Gatherings with Friends and Family:   . Attends Religious Services:   . Active Member of Clubs or Organizations:   .  Attends Archivist Meetings:   [name] Marital Status:   Intimate Partner Violence:   . Fear of Current or Ex-Partner:   . Emotionally Abused:   [name] Physically Abused:   . Sexually Abused:    Current Outpatient Medications on File Prior to Visit  Medication Sig Dispense Refill  . ACCU-CHEK GUIDE test strip USE AS INSTRUCTED TO TEST 4 TIMES DAILY DX:E11.65 100 strip 11  . albuterol (PROVENTIL) (2.5 MG/3ML) 0.083% nebulizer solution Take 3 mLs (2.5 mg total) every 4 (four) hours as needed by nebulization for shortness of breath. 75 mL 0  . albuterol (VENTOLIN HFA) 108 (90 Base) MCG/ACT inhaler Inhale 2 puffs every 6 (six) hours as needed into the lungs for wheezing. 1 Inhaler 0  . amLODipine (NORVASC) 5 MG tablet Take 1 tablet (5 mg total) by mouth daily. 90 tablet 3  . aspirin 81 MG tablet Take 1 tablet (81 mg total) by mouth daily. 30 tablet 0  . atorvastatin (LIPITOR) 10 MG tablet Take 1 tablet (10 mg total) by mouth daily. 90 tablet 3  . fluticasone furoate-vilanterol (BREO ELLIPTA) 100-25 MCG/INH AEPB Inhale 1 puff daily into the lungs. 60 each 0  . HUMALOG KWIKPEN 100 UNIT/ML KwikPen INJECT 8-14 UNITS INTO THE SKIN 3 TIMES DAILY. 15 mL 4  . Insulin Glargine (LANTUS) 100  UNIT/ML Solostar Pen Inject 28-30 Units into the skin daily with supper. 15 mL 11  . Insulin Pen Needle (BD PEN NEEDLE NANO U/F) 32G X 4 MM MISC Use to inject insulin 4 times a day. 100 each 2  . Lancets Misc. (ACCU-CHEK FASTCLIX LANCET) KIT 1 each by Does not apply route QID. DX: E11.65 400 kit 0  . metFORMIN (GLUCOPHAGE) 1000 MG tablet Take 1,035m by mouth twice a day 180 tablet 3   No current facility-administered medications on file prior to visit.   Allergies  Allergen Reactions  . Amoxicillin-Pot Clavulanate Other (See Comments)    Severe stomach pain.   . Quinapril Hcl     REACTION: back pain  . Valsartan     REACTION: back pain  . Varenicline Tartrate     REACTION: AMS, hallucinations, night mares   Family History  Problem Relation Age of Onset  . Asthma Sister   . Emphysema Sister   . Diabetes Mother   . Diabetes Brother   . Heart disease Brother    PE: There were no vitals taken for this visit. There is no height or weight on file to calculate BMI. Wt Readings from Last 3 Encounters:  09/13/19 162 lb (73.5 kg)  05/17/19 172 lb (78 kg)  12/24/18 178 lb (80.7 kg)   Constitutional: overweight, in NAD, in wheelchair Eyes: PERRLA, EOMI, no exophthalmos ENT: moist mucous membranes, no thyromegaly, no cervical lymphadenopathy Cardiovascular: RRR, No MRG Respiratory: CTA B Gastrointestinal: abdomen soft, NT, ND, BS+ Musculoskeletal: no deformities, strength intact in all 4 Skin: moist, warm, no rashes Neurological: no tremor with outstretched hands, DTR normal in all 4  ASSESSMENT: 1. DM2, insulin-dependent, uncontrolled, with complications - Cerebrovascular disease-h/o stroke  2. HL  PLAN:  1. Patient with longstanding, uncontrolled, type 2 diabetes, on basal/bolus insulin regimen and Metformin.  She was initially noncompliant with insulin doses and sugar checks but his sugars improved significantly along with his HbA1c after he started to check sugars  consistently and take insulin as prescribed.  At last visit he describes having snacks at night and sugars in the morning were higher.  I advised  him to take a low amount of Humalog if he had a snack at night.  Otherwise, we did not change his regimen. -At this visit, he brings a log that is not dated and has multiple CBGs.  I am not sure if I am looking at the latest entries.  However, he does feel that his sugars are higher in the last few months and he cannot explain it.  He does not feel his insulin is degraded, he did not change his diet or activity.  He does not feel sick. -Reviewing the sugar that I believe was checked in the last few weeks, they are much higher, in the 200s and in the 300s.  Therefore, for now, I advised him to increase the Lantus and also his Humalog.  I am suspecting he may miss some of these doses, which was happening in the past...  - I suggested to:  Patient Instructions  Please continue: - Metformin 1000 mg 2x a day   Please increase: - Lantus to 35 units in the evening, around dinnertime - Humalog 3x a day, 10-15 min before meals - 10 units before a smaller meal - 12-14 units before a regular meal - 16 units before a large meal Please take 5-6 units of Humalog before the snack at night.  Please stop at the lab.  Please return in 4 months with your sugar log.   - we we will check a fructosamine level today - advised to check sugars at different times of the day - 3x a day, rotating check times - advised for yearly eye exams >> he is not UTD - will check annual labs today - return to clinic in 4 months    2.  Hyperlipidemia -Regulated lipid panel from a year ago: LDL increased, above goal, triglycerides high: Lab Results  Component Value Date   CHOL 196 12/24/2018   HDL 42.10 12/24/2018   LDLCALC 58 09/01/2014   LDLDIRECT 118.0 12/24/2018   TRIG 289.0 (H) 12/24/2018   CHOLHDL 5 12/24/2018  -Continues Lipitor without side effects. -We will check  lipids today  Pt did not stop at the lab...  Philemon Kingdom, MD PhD [facility]

## 2020-01-16 ENCOUNTER — Other Ambulatory Visit: Payer: Self-pay | Admitting: Internal Medicine

## 2020-01-16 DIAGNOSIS — E1165 Type 2 diabetes mellitus with hyperglycemia: Secondary | ICD-10-CM

## 2020-01-30 ENCOUNTER — Telehealth: Payer: Self-pay | Admitting: Family Medicine

## 2020-01-30 NOTE — Progress Notes (Signed)
°  Chronic Care Management   Note  01/30/2020 Name: Jonathan [name] MRN: [id number] DOB: Dec 01, 1940  Jonathan [name] is a 79 y.o. year old male who is a primary care patient of Tower, Wynelle Fanny, MD. I reached out to Rise Paganini by phone today in response to a referral sent by [name]'s PCP, Tower, Wynelle Fanny, MD.   [name] was given information about Chronic Care Management services today including:  1. CCM service includes personalized support from designated clinical staff supervised by his physician, including individualized plan of care and coordination with other care providers 2. 24/7 contact phone numbers for assistance for urgent and routine care needs. 3. Service will only be billed when office clinical staff spend 20 minutes or more in a month to coordinate care. 4. Only one practitioner may furnish and bill the service in a calendar month. 5. The patient may stop CCM services at any time (effective at the end of the month) by phone call to the office staff.   Patient agreed to services and verbal consent obtained.    This note is not being shared with the patient for the following reason: To respect privacy (The patient or proxy has requested that the information not be shared).  Follow up plan:   Raynicia Dukes [facility] Scheduler

## 2020-01-30 NOTE — Progress Notes (Signed)
  Chronic Care Management   Outreach Note  01/30/2020 Name: DEONTAY DEIGNAN MRN: [id number] DOB: 02/20/1941  Referred by: Tower, Wynelle Fanny, MD Reason for referral : No chief complaint on file.   An unsuccessful telephone outreach was attempted today. The patient was referred to the pharmacist for assistance with care management and care coordination.    This note is not being shared with the patient for the following reason: To respect privacy (The patient or proxy has requested that the information not be shared).  Follow Up Plan:   Raynicia Dukes [employer] Scheduler

## 2020-03-01 ENCOUNTER — Telehealth: Payer: Self-pay

## 2020-03-01 DIAGNOSIS — I10 Essential (primary) hypertension: Secondary | ICD-10-CM

## 2020-03-01 DIAGNOSIS — J449 Chronic obstructive pulmonary disease, unspecified: Secondary | ICD-10-CM

## 2020-03-01 NOTE — Telephone Encounter (Signed)
Referral created.

## 2020-03-01 NOTE — Telephone Encounter (Signed)
Per written referral from PCP, requesting referral in Epic for Rise Paganini to chronic care management pharmacy services for the following conditions:   Essential hypertension, benign  [I10]  COPD [J44.9]  Debbora Dus, PharmD Clinical Pharmacist [facility] at [facility] [phone]

## 2020-03-07 NOTE — Chronic Care Management (AMB) (Deleted)
Chronic Care Management Pharmacy  Name: Jonathan [name]  MRN: [id number] DOB: June 14, 1941  Chief Complaint/ HPI  Jonathan [name],  79 y.o. , male presents for their Initial CCM visit with the clinical pharmacist via telephone due to COVID-19 Pandemic.  PCP : Abner Greenspan, MD  Their chronic conditions include: Hypertension, COPD, type 2 diabetes, hyperlipidemia, allergic rhinitis, tobacco abuse, history of epilepsy   Office Visits:05/08/20: Patient presented to Dr. Glori Bickers for HTN follow-up. Patient self-discontinued lisinopril (unclear reason), restarted amlodipine. No medication changes made during visit.  Consult Visit: 01/12/20: Patient presented to Dr. Cruzita Lederer (endocrinology) for T2DM follow-up. Patient reports snacking at night. Blood sugar readings higher (200-300), possibly due to missed insulin doses. Lantus increased to 35 units QHS, Humalog TID 10u for small meal, 12-14 units for regular meal, 16 units for large meal, 5-6 units for evening snack 09/13/19: Patient presented to Dr. Cruzita Lederer (endocrinology) for T2DM follow-up. A1c stable at 10%, but fructosamine had calculated A1c of 7.0%. Patient instructed to continue regimen, but to add small humalog dose to cover nighttime snacks.  Medications: Outpatient Encounter Medications as of 03/08/2020  Medication Sig Note  . ACCU-CHEK GUIDE test strip USE AS INSTRUCTED TO TEST 4 TIMES DAILY DX:E11.65   . albuterol (PROVENTIL) (2.5 MG/3ML) 0.083% nebulizer solution Take 3 mLs (2.5 mg total) every 4 (four) hours as needed by nebulization for shortness of breath.   [name] albuterol (VENTOLIN HFA) 108 (90 Base) MCG/ACT inhaler Inhale 2 puffs every 6 (six) hours as needed into the lungs for wheezing. 12/09/2017: Pt unsure if he used this or his BREO/Albuterol today   . amLODipine (NORVASC) 5 MG tablet Take 1 tablet (5 mg total) by mouth daily.   [name] aspirin 81 MG tablet Take 1 tablet (81 mg total) by mouth daily.   [name] atorvastatin (LIPITOR) 10 MG tablet  Take 1 tablet (10 mg total) by mouth daily.   . fluticasone furoate-vilanterol (BREO ELLIPTA) 100-25 MCG/INH AEPB Inhale 1 puff daily into the lungs. 12/09/2017: Pt unsure if he used this or his BREO/Albuterol today   . HUMALOG KWIKPEN 100 UNIT/ML KwikPen INJECT 8-14 UNITS INTO THE SKIN 3 TIMES DAILY.   [name] Insulin Glargine (LANTUS) 100 UNIT/ML Solostar Pen Inject 28-30 Units into the skin daily with supper.   . Insulin Pen Needle (BD PEN NEEDLE NANO U/F) 32G X 4 MM MISC Use to inject insulin 4 times a day.   . Lancets Misc. (ACCU-CHEK FASTCLIX LANCET) KIT 1 each by Does not apply route QID. DX: E11.65   . metFORMIN (GLUCOPHAGE) 1000 MG tablet TAKE ONE TABLET BY MOUTH  TWICE DAILY    No facility-administered encounter medications on file as of 03/08/2020.     Current Diagnosis/Assessment:  Goals Addressed   None     COPD / Asthma / Tobacco   Previously seen by Dr. Elsworth Soho (Pulmonology) last visit 2012  Last spirometry score: moderate airway obstruction with FEv1 54% (04/19/10)  Gold Grade: Gold 2 (FEV1 50-79%) Current COPD Classification:  {CHL HP [facility]stream Pharm COPD Classification:[phone]}  Eosinophil count:   Lab Results  Component Value Date/Time   EOSPCT 2 12/09/2017 06:22 PM  %                               Eos (Absolute):  Lab Results  Component Value Date/Time   EOSABS 0.1 12/09/2017 06:22 PM    Tobacco Status:  Social History   Tobacco  Use  Smoking Status Current Every Day Smoker  . Packs/day: 1.00  . Types: Cigarettes  Smokeless Tobacco Never Used    Patient has failed these meds in past: *** Patient is currently {CHL Controlled/Uncontrolled:[phone]} on the following medications: ***  Proventil 0.083% neb  Ventolin HFA 108 mcg/act 2 puff q6hr PRN  Breo Ellipta 100-25 mcg/inh 1 puff daily Using maintenance inhaler regularly? {yes/no:20286} Frequency of rescue inhaler use:  {CHL HP [facility] Pharm Inhaler JHER:[id number]}  We discussed:  {CHL HP Upstream  Pharmacy discussion:[phone]}  Plan  Continue {CHL HP Upstream Pharmacy [phone]} ,  Diabetes   Managed by Dr. Cruzita Lederer (endocrinology) Inhalers currently filled by Dr.Silva (inpatient)  Recent Relevant Labs: Lab Results  Component Value Date/Time   HGBA1C 10.0 (A) 09/13/2019 03:39 PM   HGBA1C 10.0 (A) 12/24/2018 03:01 PM   HGBA1C 7.2 (H) 03/06/2015 02:16 PM   HGBA1C 7.9 (H) 12/01/2014 09:17 AM   MICROALBUR 10.5 (H) 09/15/2016 02:56 PM   MICROALBUR 16.2 (H) 04/28/2014 08:09 AM     Checking BG: {CHL HP Blood Glucose Monitoring Frequency:[phone]}  Recent FBG Readings: Recent pre-meal BG readings: *** Recent 2hr PP BG readings:  *** Recent HS BG readings: ***  Patient has failed these meds in past: *** Patient is currently {CHL Controlled/Uncontrolled:[phone]} on the following medications:   Humalog 8-14 units TID    10 units for small meal   12-14 units for regular meal    16 units for large meal   5-6 units for evening snack  Lantus 35 units daily with supper  Metformin 1000 mg BID  Last diabetic Foot exam:  Lab Results  Component Value Date/Time   HMDIABEYEEXA negative 02/19/2011 12:00 AM    Last diabetic Eye exam:  Lab Results  Component Value Date/Time   HMDIABFOOTEX normal 12/12/2014 12:00 AM     We discussed: {CHL HP Upstream Pharmacy discussion:[phone]}  Plan  Continue {CHL HP Upstream Pharmacy [phone]}   Hypertension   BP today is:  {CHL HP UPSTREAM Pharmacist BP ranges:[phone]}  Office blood pressures are  BP Readings from Last 3 Encounters:  01/12/20 122/60  09/13/19 (!) 142/70  05/17/19 120/60    Patient has failed these meds in the past: Valsartan (back pain), quinapril (back pain), lisinopril Patient is currently {CHL Controlled/Uncontrolled:[phone]} on the following medications:   Amlodipine 5 mg daily  Patient checks BP at home {CHL HP BP Monitoring Frequency:[phone]}  Patient home BP readings are  ranging: ***  We discussed {CHL HP Upstream Pharmacy discussion:[phone]}  Plan  Continue {CHL HP Upstream Pharmacy [phone]}   Hyperlipidemia   History of stroke 2013  Lipid Panel     Component Value Date/Time   CHOL 196 12/24/2018 1519   TRIG 289.0 (H) 12/24/2018 1519   HDL 42.10 12/24/2018 1519   LDLCALC 58 09/01/2014 0935   LDLDIRECT 118.0 12/24/2018 1519     The 10-year ASCVD risk score Mikey Bussing DC Jr., et al., 2013) is: 55.1%   Values used to calculate the score:     Age: 38 years     Sex: Male     Is Non-Hispanic African American: No     Diabetic: Yes     Tobacco smoker: Yes     Systolic Blood Pressure: 563 mmHg     Is BP treated: Yes     HDL Cholesterol: 42.1 mg/dL     Total Cholesterol: 196 mg/dL   Patient has failed these meds in past: *** Patient is currently {CHL Controlled/Uncontrolled:[phone]} on the following  medications:   Aspirin 81 mg daily   Atorvastatin 10 mg daily  We discussed:  {CHL HP Upstream Pharmacy [phone]}  Plan  Continue {CHL HP Upstream Pharmacy OECXF:[id number]}  Tobacco Abuse   Tobacco Status:  Social History   Tobacco Use  Smoking Status Current Every Day Smoker  . Packs/day: 1.00  . Types: Cigarettes  Smokeless Tobacco Never Used    Patient smokes {Time to first cigarette:23873} Patient triggers include: {Smoking Triggers:23882} On a scale of 1-10, reports MOTIVATION to quit is *** On a scale of 1-10, reports CONFIDENCE in quitting is ***  Patient has failed these meds in past: *** Patient is currently {CHL Controlled/Uncontrolled:[phone]} on the following medications: ***  We discussed:  {Smoking Cessation Counseling:23883}  Plan  Continue {CHL HP [facility]stream Pharmacy GZFPO:[id number]}  Vaccines   Reviewed and discussed patient's vaccination history.    Immunization History  Administered Date(s) Administered  . Td 09/29/2002    Plan  Recommended patient receive *** vaccine in  *** office/pharmacy.   Medication Management   Pt uses [facility] pharmacy for all medications Uses pill box? {Yes or If no, why not?:20788} Pt endorses ***% compliance  We discussed: ***  Plan  {US Pharmacy ZXYO:11886}    Follow up: *** month phone visit  ***

## 2020-03-08 ENCOUNTER — Telehealth: Payer: Medicare Other

## 2020-03-08 NOTE — Chronic Care Management (AMB) (Deleted)
Chronic Care Management Pharmacy  Name: Jonathan [name]  MRN: [id number] DOB: 10/09/1940  Chief Complaint/ HPI  Jonathan [name],  79 y.o. , male presents for their Initial CCM visit with the clinical pharmacist via telephone due to COVID-19 Pandemic.  PCP : Abner Greenspan, MD  Their chronic conditions include: Hypertension, COPD, type 2 diabetes, hyperlipidemia, allergic rhinitis, tobacco abuse, history of epilepsy   Office Visits:05/08/20: Patient presented to Dr. Glori Bickers for HTN follow-up. Patient self-discontinued lisinopril (unclear reason), restarted amlodipine. No medication changes made during visit.  Consult Visit: 01/12/20: Patient presented to Dr. Cruzita Lederer (endocrinology) for T2DM follow-up. Patient reports snacking at night. Blood sugar readings higher (200-300), possibly due to missed insulin doses. Lantus increased to 35 units QHS, Humalog TID 10u for small meal, 12-14 units for regular meal, 16 units for large meal, 5-6 units for evening snack 09/13/19: Patient presented to Dr. Cruzita Lederer (endocrinology) for T2DM follow-up. A1c stable at 10%, but fructosamine had calculated A1c of 7.0%. Patient instructed to continue regimen, but to add small humalog dose to cover nighttime snacks.  Medications: Outpatient Encounter Medications as of 03/08/2020  Medication Sig Note  . ACCU-CHEK GUIDE test strip USE AS INSTRUCTED TO TEST 4 TIMES DAILY DX:E11.65   . albuterol (PROVENTIL) (2.5 MG/3ML) 0.083% nebulizer solution Take 3 mLs (2.5 mg total) every 4 (four) hours as needed by nebulization for shortness of breath.   [name] albuterol (VENTOLIN HFA) 108 (90 Base) MCG/ACT inhaler Inhale 2 puffs every 6 (six) hours as needed into the lungs for wheezing. 12/09/2017: Pt unsure if he used this or his BREO/Albuterol today   . amLODipine (NORVASC) 5 MG tablet Take 1 tablet (5 mg total) by mouth daily.   [name] aspirin 81 MG tablet Take 1 tablet (81 mg total) by mouth daily.   [name] atorvastatin (LIPITOR) 10 MG tablet  Take 1 tablet (10 mg total) by mouth daily.   . fluticasone furoate-vilanterol (BREO ELLIPTA) 100-25 MCG/INH AEPB Inhale 1 puff daily into the lungs. 12/09/2017: Pt unsure if he used this or his BREO/Albuterol today   . HUMALOG KWIKPEN 100 UNIT/ML KwikPen INJECT 8-14 UNITS INTO THE SKIN 3 TIMES DAILY.   [name] Insulin Glargine (LANTUS) 100 UNIT/ML Solostar Pen Inject 28-30 Units into the skin daily with supper.   . Insulin Pen Needle (BD PEN NEEDLE NANO U/F) 32G X 4 MM MISC Use to inject insulin 4 times a day.   . Lancets Misc. (ACCU-CHEK FASTCLIX LANCET) KIT 1 each by Does not apply route QID. DX: E11.65   . metFORMIN (GLUCOPHAGE) 1000 MG tablet TAKE ONE TABLET BY MOUTH  TWICE DAILY    No facility-administered encounter medications on file as of 03/08/2020.   Current Diagnosis/Assessment:  Goals Addressed   None     COPD / Tobacco   Previously seen by Dr. Elsworth Soho (Pulmonology) last visit 2012  Last spirometry score: moderate airway obstruction with FEV1 54% (04/19/10)  Gold Grade: Gold 2 (FEV1 50-79%) Current COPD Classification:  {CHL HP Upstream Pharm COPD Classification:[phone]}  Eosinophil count:   Lab Results  Component Value Date/Time   EOSPCT 2 12/09/2017 06:22 PM  %                               Eos (Absolute):  Lab Results  Component Value Date/Time   EOSABS 0.1 12/09/2017 06:22 PM   Tobacco Status:  Social History   Tobacco Use  Smoking Status Current  Every Day Smoker  . Packs/day: 1.00  . Types: Cigarettes  Smokeless Tobacco Never Used   Patient has failed these meds in past: *** Patient is currently {CHL Controlled/[phone]} on the following medications: ***   Proventil 0.083% neb   Ventolin HFA 108 mcg/act 2 puff q6hr PRN   Breo Ellipta 100-25 mcg/inh 1 puff daily Using maintenance inhaler regularly? {yes/no:20286} Frequency of rescue inhaler use:  {CHL HP Upstream Pharm Inhaler QJJH:4174081448}  We discussed:  {CHL HP Upstream Pharmacy  discussion:[phone]}  Plan: Continue {CHL HP Upstream Pharmacy JEHUD:1497026378}  Diabetes   Managed by Dr. Cruzita Lederer (endocrinology) Inhalers currently filled by Dr.Silva (inpatient)  Recent Relevant Labs: Lab Results  Component Value Date/Time   HGBA1C 10.0 (A) 09/13/2019 03:39 PM   HGBA1C 10.0 (A) 12/24/2018 03:01 PM   HGBA1C 7.2 (H) 03/06/2015 02:16 PM   HGBA1C 7.9 (H) 12/01/2014 09:17 AM   MICROALBUR 10.5 (H) 09/15/2016 02:56 PM   MICROALBUR 16.2 (H) 04/28/2014 08:09 AM     Checking BG: {CHL HP Blood Glucose Monitoring [phone]}  Recent FBG Readings: Recent pre-meal BG readings: *** Recent 2hr PP BG readings:  *** Recent HS BG readings: ***  Patient has failed these meds in past: *** Patient is currently {CHL Controlled/[phone]} on the following medications:    Humalog 8-14 units TID     10 units for small meal    12-14 units for regular meal     16 units for large meal    5-6 units for evening snack   Lantus 35 units daily with supper   Metformin 1000 mg BID  Last diabetic eye exam:  Lab Results  Component Value Date/Time   HMDIABEYEEXA negative 02/19/2011 12:00 AM    Last diabetic foot exam:  Lab Results  Component Value Date/Time   HMDIABFOOTEX normal 12/12/2014 12:00 AM     We discussed: {CHL HP Upstream Pharmacy discussion:[phone]}  Plan: Continue {CHL HP Upstream Pharmacy Plans:[phone]}   Hypertension   BP today is:  {CHL HP UPSTREAM Pharmacist BP ranges:[phone]}  Office blood pressures are  BP Readings from Last 3 Encounters:  01/12/20 122/60  09/13/19 (!) 142/70  05/17/19 120/60   Patient has failed these meds in the past: Valsartan (back pain), quinapril (back pain), lisinopril Patient is currently {CHL Controlled/[phone]} on the following medications:    Amlodipine 5 mg daily   Patient checks BP at home {CHL HP BP Monitoring Frequency:2268818537} Patient home BP readings are  ranging: ***  We discussed {CHL HP Upstream Pharmacy discussion:[phone]}  Plan: Continue {CHL HP Upstream Pharmacy Plans:[phone]}   Hyperlipidemia   History of stroke 2013  Lipid Panel     Component Value Date/Time   CHOL 196 12/24/2018 1519   TRIG 289.0 (H) 12/24/2018 1519   HDL 42.10 12/24/2018 1519   LDLCALC 58 09/01/2014 0935   LDLDIRECT 118.0 12/24/2018 1519    LDL goal < 70 Patient has failed these meds in past: *** Patient is currently {CHL Controlled/[phone]} on the following medications:    Aspirin 81 mg daily    Atorvastatin 10 mg daily  We discussed:  {CHL HP Upstream Pharmacy discussion:[phone]}  Plan: Continue {CHL HP Upstream Pharmacy Plans:[phone]}  Tobacco Abuse   Tobacco Status:  Social History   Tobacco Use  Smoking Status Current Every Day Smoker  . Packs/day: 1.00  . Types: Cigarettes  Smokeless Tobacco Never Used    Patient smokes {Time to first cigarette:23873} Patient triggers include: {Smoking Triggers:23882} On a scale of 1-10, reports MOTIVATION to  quit is *** On a scale of 1-10, reports CONFIDENCE in quitting is ***  Patient has failed these meds in past: *** Patient is currently {CHL Controlled/Uncontrolled:4055901942} on the following medications: ***  We discussed:  {Smoking Cessation Counseling:23883}  Plan  Continue {CHL HP Upstream Pharmacy MBOMQ:5927639432}  Vaccines   Reviewed and discussed patient's vaccination history.    Immunization History  Administered Date(s) Administered  . Td 09/29/2002    Plan  Recommended patient receive *** vaccine in *** office/pharmacy.   Medication Management   Pt uses [facility] pharmacy for all medications Uses pill box? {Yes or If no, why not?:20788} Pt endorses ***% compliance  We discussed: ***  Plan  {US Pharmacy WQVL:94446}    Follow up: *** month phone visit  ***  Debbora Dus, PharmD Clinical Pharmacist [facility] at  [facility] [phone]

## 2020-03-15 ENCOUNTER — Ambulatory Visit (INDEPENDENT_AMBULATORY_CARE_PROVIDER_SITE_OTHER): Payer: Medicare Other | Admitting: Family Medicine

## 2020-03-15 ENCOUNTER — Other Ambulatory Visit: Payer: Self-pay

## 2020-03-15 ENCOUNTER — Encounter: Payer: Self-pay | Admitting: Family Medicine

## 2020-03-15 VITALS — BP 114/68 | HR 102 | Temp 98.1°F | Ht 66.0 in | Wt 158.5 lb

## 2020-03-15 DIAGNOSIS — L03115 Cellulitis of right lower limb: Secondary | ICD-10-CM

## 2020-03-15 DIAGNOSIS — Z794 Long term (current) use of insulin: Secondary | ICD-10-CM

## 2020-03-15 DIAGNOSIS — E1165 Type 2 diabetes mellitus with hyperglycemia: Secondary | ICD-10-CM | POA: Diagnosis not present

## 2020-03-15 MED ORDER — CEPHALEXIN 500 MG PO CAPS: 500.0000 mg | ORAL_CAPSULE | Freq: Two times a day (BID) | ORAL | 0 refills | Status: AC

## 2020-03-15 NOTE — Progress Notes (Signed)
Subjective:     Jonathan [name] is a 79 y.o. male presenting for Foot Swelling (C/o right foot swelling.  Denies any pain. Started 1-2 wks ago.  Also, c/o of "spot" on foot.  Concerned due to DM.  Pt accompanied by daughter, Jeani Hawking- temp 97.9.)     HPI   #Right foot - spot on his foot - started 2 weeks ago - not painful - pt has diabetes - not getting worse - endorses some swelling in the foot as well - also has a spot on his hand - no fever/chills - no systemic symptoms  Lab Results  Component Value Date   HGBA1C 10.0 (A) 09/13/2019     Review of Systems   Social History   Tobacco Use  Smoking Status Current Every Day Smoker  . Packs/day: 1.00  . Types: Cigarettes  Smokeless Tobacco Never Used        Objective:    BP Readings from Last 3 Encounters:  03/15/20 114/68  01/12/20 122/60  09/13/19 (!) 142/70   Wt Readings from Last 3 Encounters:  03/15/20 158 lb 8 oz (71.9 kg)  01/12/20 155 lb (70.3 kg)  09/13/19 162 lb (73.5 kg)    BP 114/68 (BP Location: Left Arm, Patient Position: Sitting, Cuff Size: Normal)   Pulse (!) 102   Temp 98.1 F (36.7 C) (Temporal)   Ht 5\' 6"  (1.676 m)   Wt 158 lb 8 oz (71.9 kg)   SpO2 97%   BMI 25.58 kg/m    Physical Exam Constitutional:      Appearance: Normal appearance. He is not ill-appearing or diaphoretic.  HENT:     Right Ear: External ear normal.     Left Ear: External ear normal.     Nose: Nose normal.  Eyes:     General: No scleral icterus.    Extraocular Movements: Extraocular movements intact.     Conjunctiva/sclera: Conjunctivae normal.  Cardiovascular:     Rate and Rhythm: Normal rate.  Pulmonary:     Effort: Pulmonary effort is normal.  Musculoskeletal:     Cervical back: Neck supple.  Skin:    General: Skin is warm and dry.     Comments: Bilateral feet with dry skin, also with yellow/green flaking stuck on lesions. Area of concern is a small area of erythema overlaying the medial malleolus  distally. And compared to the left foot the right has slight diffuse erythema.  Neurological:     Mental Status: He is alert. Mental status is at baseline.  Psychiatric:        Mood and Affect: Mood normal.           Assessment & Plan:   Problem List Items Addressed This Visit      Endocrine   Type 2 diabetes mellitus with hyperglycemia, with long-term current use of insulin ([place])    Other Visit Diagnoses    Cellulitis of right lower extremity    -  Primary   Relevant Medications   cephALEXin (KEFLEX) 500 MG capsule     Erythema x 2 weeks in the setting of poorly controled DM. Elected trial of antibiotics in case there is an infection given that it has not improved. ER precautions if worsening.   If unimproved given location over the bone could consider XR.   Return in about 1 week (around 03/22/2020).  Lesleigh Noe, MD  This visit occurred during the SARS-CoV-2 public health emergency.  Safety protocols were in place, including  screening questions prior to the visit, additional usage of staff PPE, and extensive cleaning of exam room while observing appropriate contact time as indicated for disinfecting solutions.

## 2020-03-15 NOTE — Patient Instructions (Addendum)
#  Skin infection - use the antibiotic for 7 days - if the redness worsens or does not go away at the end of antibiotics return to see me or [place], Wynelle Fanny, MD   Consider a probiotic or eating yogurt while on the antibiotic

## 2020-03-30 ENCOUNTER — Telehealth: Payer: Self-pay

## 2020-03-30 ENCOUNTER — Other Ambulatory Visit (INDEPENDENT_AMBULATORY_CARE_PROVIDER_SITE_OTHER): Payer: Medicare Other

## 2020-03-30 ENCOUNTER — Other Ambulatory Visit: Payer: Self-pay

## 2020-03-30 DIAGNOSIS — I10 Essential (primary) hypertension: Secondary | ICD-10-CM | POA: Diagnosis not present

## 2020-03-30 DIAGNOSIS — E785 Hyperlipidemia, unspecified: Secondary | ICD-10-CM

## 2020-03-30 NOTE — Addendum Note (Signed)
Addended by: Ellamae Sia on: 03/30/2020 03:25 PM   Modules accepted: Orders

## 2020-03-30 NOTE — Telephone Encounter (Signed)
Patient came in for scheduled fasting lab work this afternoon around 330pm.  While here, his wife reported to lab technician that she thought he may be having a stroke as he has had slurred speech and facial drooping X 2 days.  I was immediately called to respond as triage nurse.   Assessment: 2 days ago, patient had a brief 1-2 minute episode where he was awake but was reported to be "non responsive" by the caregiver.  Ever since, caregiver has been concerned that he has had some facial drooping and slurred speech. He has had no further episodes since then.  He does not remember the episode and states that he feels fine and denies any issues since.   VS:  bp 112/60, HR: 100, Resp 12, slightly depressed, 02 Sats: 95% on RA, temp: 98.1  FSBS:  346 (Last ate a doughnut at 11am, did not take his insulin or oral hypoglycemics today).   Patient does report being noncompliant with all medications.   Neurologic;   patient is alert and oriented X 3, facial symmetry was present with maybe a miniscule "slowness" to left corner of mouth at times when he spoke. Smile was equal, PERRLA, was able to track without difficulty.  He denies any numbness or tingling to any parts of body or face and sense of touch was equal on both sides.  All extremities were normal with strength and movement.  He did report minimal intermittent tingling to left thumb on occasion for past several weeks but none at present.   His speech was mildly noted at times as slurring but he had an excess amount of saliva in his mouth and throat.  He did express noting increased drooling in the past week.  Once cleared, he spoke more clearly.   Respiratory - diminished lung sounds throughout with Resp Rate of 12 but denies any SOB and no immediate signs of hypoxia (skin and lip color WNL no signs of cyanosis).  He is using his inhaler daily and has albuterol prn. Has COPD, his episode possibly could be related to period of hypoxia.     Cardiovascular - Heart rhythm regular but mildly tachycardic; however his vital signs are per his usual baseline.  Denies any chest pain.  No signs of edema to extremities, Pulses present to upper and lower extremities weak, but equal. Not taking his cardiac medication with consistency.   Integumentary - Patient recently treated with abx for right foot cellulitis.  He has finished abx, the area is still tender to touch with mild pink discoloration but no signs of open wound, redness, warmth or progressing infection/cellulitis.   No Changes in Bowel/Bladder other than ongoing, chronic occasional incontinence.   Gait - patients gate is normal per his baseline.  He is unsteady, a fall risk and could benefit from a walker, but this is his normal pattern per family member present.   After discussing patient with Dr. Glori Bickers, it was determined that he was stable and not showing signs of an acute, active CVA and does not need to go to ER.  But, given that he is high risk for CVA, I did review warning signs of CVA/TIA and made sure a family member would be with him 24 hours per day to monitor.  If any of the warning sign symptoms, they are to call 911 and immediately go to the hospital.  Patient verbalized agreement with allowing caregiver to support him in this way if anything developed.  He does have  appointment next week on 7/9 with Dr. Glori Bickers to follow up further with his condition.   Patient and wife felt very comfortable with this plan and are in agreement.

## 2020-04-02 LAB — CBC WITH DIFFERENTIAL/PLATELET
Absolute Monocytes: 510 cells/uL (ref 200–950)
Basophils Absolute: 44 cells/uL (ref 0–200)
Basophils Relative: 0.5 %
Eosinophils Absolute: 141 cells/uL (ref 15–500)
Eosinophils Relative: 1.6 %
HCT: 54.4 % — ABNORMAL HIGH (ref 38.5–50.0)
Hemoglobin: 17.7 g/dL — ABNORMAL HIGH (ref 13.2–17.1)
Lymphs Abs: 1822 cells/uL (ref 850–3900)
MCH: 29 pg (ref 27.0–33.0)
MCHC: 32.5 g/dL (ref 32.0–36.0)
MCV: 89.2 fL (ref 80.0–100.0)
MPV: 10.9 fL (ref 7.5–12.5)
Monocytes Relative: 5.8 %
Neutro Abs: 6283 cells/uL (ref 1500–7800)
Neutrophils Relative %: 71.4 %
Platelets: 242 10*3/uL (ref 140–400)
RBC: 6.1 10*6/uL — ABNORMAL HIGH (ref 4.20–5.80)
RDW: 13.1 % (ref 11.0–15.0)
Total Lymphocyte: 20.7 %
WBC: 8.8 10*3/uL (ref 3.8–10.8)

## 2020-04-02 LAB — COMPREHENSIVE METABOLIC PANEL
AG Ratio: 1.8 (calc) (ref 1.0–2.5)
ALT: 9 U/L (ref 9–46)
AST: 12 U/L (ref 10–35)
Albumin: 4.1 g/dL (ref 3.6–5.1)
Alkaline phosphatase (APISO): 125 U/L (ref 35–144)
BUN: 16 mg/dL (ref 7–25)
CO2: 24 mmol/L (ref 20–32)
Calcium: 8.9 mg/dL (ref 8.6–10.3)
Chloride: 96 mmol/L — ABNORMAL LOW (ref 98–110)
Creat: 1.11 mg/dL (ref 0.70–1.18)
Globulin: 2.3 g/dL (calc) (ref 1.9–3.7)
Glucose, Bld: 446 mg/dL — ABNORMAL HIGH (ref 65–99)
Potassium: 4.1 mmol/L (ref 3.5–5.3)
Sodium: 135 mmol/L (ref 135–146)
Total Bilirubin: 0.9 mg/dL (ref 0.2–1.2)
Total Protein: 6.4 g/dL (ref 6.1–8.1)

## 2020-04-02 LAB — LIPID PANEL
Cholesterol: 187 mg/dL (ref <200)
HDL: 42 mg/dL (ref >=40)
LDL Cholesterol (Calc): 105 mg/dL (calc) — ABNORMAL HIGH
Non-HDL Cholesterol (Calc): 145 mg/dL (calc) — ABNORMAL HIGH (ref <130)
Total CHOL/HDL Ratio: 4.5 (calc) (ref <5.0)
Triglycerides: 298 mg/dL — ABNORMAL HIGH (ref <150)

## 2020-04-02 LAB — TSH: TSH: 2.79 mIU/L (ref 0.40–4.50)

## 2020-04-03 NOTE — Telephone Encounter (Signed)
Patient states his CBG has not gotten that high before and not since. I let patient know to contact us if his CBG run that high again before his appointment with [place] in a week.  Patient expressed understanding and agreement. No further questions.

## 2020-04-03 NOTE — Telephone Encounter (Signed)
M, can you please call him this morning to see if this is an isolated incident or his sugars have been consistently high?  He does have an appointment with me 1 week, but we may need to change his insulin dosing since then.  Also, I am not sure I understand the title of this message thread-did he really have slurred speech/facial droop?

## 2020-04-03 NOTE — Telephone Encounter (Signed)
Jonathan [name] - Client TELEPHONE ADVICE RECORD [facility] Patient Name: Jonathan [name] Gender: Male DOB: 1940-10-04 Age: 79 Y 9 M 8 D Return Phone Number: [phone] (Primary) Address: City/State/Zip: Unavailable Client Jonathan [name] - Client Client Site [place] Physician Jonathan [name] - MD Contact Type Call Who Is Calling Lab Lab Name [facility] Lab Phone Number [phone] Lab Tech Name Jonathan [name] Lab Reference Number [id number] Chief Complaint Lab Result (Critical or Stat) Call Type Lab Send to RN Reason for Call Report lab results Initial Comment Caller has urgent lab results Additional Comment Caller states he doesn't have zip code or state of residence. Translation No Nurse Assessment Nurse: Jonathan [name] Date/Time Jonathan [name] Time): 04/02/2020 2:45:25 PM Is there an on-call provider listed? ---Yes Please list name of person reporting value (Lab Employee) and a contact number. ---Jonathan [name] with [phone] Please document the following items: Lab name Lab value (read back to lab to verify) Reference range for lab value Date and time blood was drawn Collect time of birth for bilirubin results ---Ordered 03/30/20 collected 1528 Glucose 446 and verified by repeat analysis No previous Please collect the patient contact information from the lab. (name, phone number and address) ---NA Nurse: Jonathan [name] Date/Time (Eastern Time): 04/02/2020 2:45:11 PM Confirm and document reason for call. If symptomatic, describe symptoms. ---Caller's his blood sugar was 446 on 03/30/20 per lab. He takes Humalog 12 Units TID and Lantus 35 Units at [name]. Has the patient had close contact with a person known or suspected to have the novel coronavirus illness OR traveled / lives in area with major community spread (including international travel) in the last 14 days from the  onset of symptoms? * If Asymptomatic, screen for exposure and travel within the last 14 days. ---No Does the patient have any new or worsening symptoms? ---Yes Will a triage be completed? ---Yes Related visit to physician within the last 2 weeks? ---Yes Does the PT have any chronic conditions? (i.e. diabetes, asthma, this includes High risk factors for pregnancy, etc.) ---Yes PLEASE NOTE: All timestamps contained within this report are represented as [place] Standard Time. CONFIDENTIALTY NOTICE: This fax transmission is intended only for the addressee. It contains information that is legally privileged, confidential or otherwise protected from use or disclosure. If you are not the intended recipient, you are strictly prohibited from reviewing, disclosing, copying using or disseminating any of this information or taking any action in reliance on or regarding this information. If you have received this fax in error, please notify us immediately by telephone so that we can arrange for its return to [place]. Phone: [phone], Toll-Free: [phone], Fax: [phone] Page: 2 of 3 Call Id: [id number] Nurse Assessment List chronic conditions. ---diabetes Is this a behavioral health or substance abuse call? ---No Guidelines Guideline Title Affirmed Question Affirmed Notes Nurse Date/Time (Eastern Time) Diabetes - High Blood Sugar [1] Blood glucose > 300 mg/dL (16.7 mmol/L) AND [2] two or more times in a row Richards, RN, Jonathan [name] 04/02/2020 3:22:07 PM Diabetes - High Blood Sugar [1] Blood glucose > 300 mg/dL (16.7 mmol/L) AND [2] two or more times in a row Jonathan [name], Jonathan [name] 04/02/2020 3:37:31 PM Disp. Time Jonathan [name] Time) Disposition Final User 04/02/2020 2:51:25 PM Paged On Call back to [facility], Jonathan [name] 04/02/2020 3:28:01 PM Call PCP Now Jonathan [name] 04/02/2020 3:35:46 PM Paged On Call back to [facility], Jonathan [name] 04/02/2020 3:38:04 PM  Call PCP Now Yes  Jonathan Salt, RN, Jonathan [name] Disagree/Comply Comply Caller Understands Yes PreDisposition Call Doctor Care Advice Given Per Guideline CALL PCP NOW: CALL BACK IF: * Vomiting occurs * Rapid breathing occurs * You become worse. CALL PCP NOW: * Vomiting occurs CALL BACK IF: * Rapid breathing occurs * You become worse. Comments User: Jonathan Ditty, RN Date/Time Jonathan [name] Time): 04/02/2020 3:11:13 PM User: Jonathan Ditty, RN Date/Time Jonathan [name] Time): 04/02/2020 3:17:29 PM He takes Lantus 35 Units usually at [name] time Humalog 12 Units TID User: Jonathan Ditty, RN Date/Time (Eastern Time): 04/02/2020 3:25:59 PM BS 388 PLEASE NOTE: All timestamps contained within this report are represented as [place] Standard Time. CONFIDENTIALTY NOTICE: This fax transmission is intended only for the addressee. It contains information that is legally privileged, confidential or otherwise protected from use or disclosure. If you are not the intended recipient, you are strictly prohibited from reviewing, disclosing, copying using or disseminating any of this information or taking any action in reliance on or regarding this information. If you have received this fax in error, please notify us immediately by telephone so that we can arrange for its return to [place]. Phone: [phone], Toll-Free: [phone], Fax: [phone] Page: 3 of 3 Call Id: [id number] Paging DoctorName Phone DateTime Result/Outcome Message Type Notes Jonathan [name] - MD [phone] 04/02/2020 2:51:25 PM Paged On Call Back to Call Center Doctor Paged Please call Jonathan Chapel, RN with [facility] [phone]. Thanks Jonathan [name] - MD 04/02/2020 3:34:39 PM Spoke with On Call - General Message Result Spoke with Jonathan [name], reporting --- Critical Lab Ordered 03/30/20 collected 1528 Glucose 446 and verified by repeat analysis as reported by --- Jonathan [name] with [facility] [phone]. Patient Ph# [phone]. Order received to  call patient and do assessment; have him do blood sugar now. Jonathan [name] - MD [phone] 04/02/2020 3:35:44 PM Paged On Call Back to Call Center Doctor Paged Please call Jonathan Crown, RN with [facility] [phone]. Thanks. Jonathan [name] - MD 04/02/2020 3:55:02 PM Spoke with On Call - General Message Result Spoke with Jonathan [name], giving the BS results from Jonathan [name] as 3. He stated he had not checked his BS today at all. He had 2 glucometers but did not know where they were. It took 10 minutes to find them. Caller denies any Sx of high blood sugar. He stated he normally takes "an extra Humalog dose when he gets a high reading." Jonathan [name] gave orders to call patient back and tell him to increase Lantus dose from 35 Units to 40 units tonight and call the PCP office tomorrow. Upon calling patient back he states he has already taken Humalog 12 Units right before I called. He stated he will hold off on the Lantus dose increase to 40 Units. I urged patient to call Jonathan Glori Bickers tomorrow. Patient does not seem to have good control of blood sugars, stating all his FBS are 280-345. He states he is tired of sticking himself and he feels fine anyway.

## 2020-04-03 NOTE — Telephone Encounter (Signed)
I spoke with Mrs Blowe (DPR signed);she said pt had already left the house and pt was not answering his cell phone. Mrs Frutiger said she did not know if he cked his FBS this morning or not but pt seemed OK when he left the house earlier. Sending note to Dr Glori Bickers and Mrs Moan said after Dr Glori Bickers reviews can cb and let her know what to tell pt.

## 2020-04-03 NOTE — Telephone Encounter (Signed)
He sees Dr Cruzita Lederer for his diabetes-I will forward to her office  Please let him know

## 2020-04-06 ENCOUNTER — Encounter: Payer: Medicare Other | Admitting: Family Medicine

## 2020-04-09 ENCOUNTER — Other Ambulatory Visit: Payer: Self-pay

## 2020-04-09 ENCOUNTER — Encounter: Payer: Self-pay | Admitting: Family Medicine

## 2020-04-09 ENCOUNTER — Ambulatory Visit (INDEPENDENT_AMBULATORY_CARE_PROVIDER_SITE_OTHER): Payer: Medicare Other | Admitting: Family Medicine

## 2020-04-09 VITALS — BP 118/74 | HR 99 | Temp 96.3°F | Ht 65.0 in | Wt 159.5 lb

## 2020-04-09 DIAGNOSIS — G40209 Localization-related (focal) (partial) symptomatic epilepsy and epileptic syndromes with complex partial seizures, not intractable, without status epilepticus: Secondary | ICD-10-CM

## 2020-04-09 DIAGNOSIS — E1169 Type 2 diabetes mellitus with other specified complication: Secondary | ICD-10-CM

## 2020-04-09 DIAGNOSIS — Z720 Tobacco use: Secondary | ICD-10-CM

## 2020-04-09 DIAGNOSIS — Z0000 Encounter for general adult medical examination without abnormal findings: Secondary | ICD-10-CM

## 2020-04-09 DIAGNOSIS — E785 Hyperlipidemia, unspecified: Secondary | ICD-10-CM

## 2020-04-09 DIAGNOSIS — Z91199 Patient's noncompliance with other medical treatment and regimen due to unspecified reason: Secondary | ICD-10-CM

## 2020-04-09 DIAGNOSIS — J449 Chronic obstructive pulmonary disease, unspecified: Secondary | ICD-10-CM

## 2020-04-09 DIAGNOSIS — I10 Essential (primary) hypertension: Secondary | ICD-10-CM

## 2020-04-09 DIAGNOSIS — J9611 Chronic respiratory failure with hypoxia: Secondary | ICD-10-CM

## 2020-04-09 DIAGNOSIS — Z9119 Patient's noncompliance with other medical treatment and regimen: Secondary | ICD-10-CM

## 2020-04-09 DIAGNOSIS — H539 Unspecified visual disturbance: Secondary | ICD-10-CM

## 2020-04-09 DIAGNOSIS — E1165 Type 2 diabetes mellitus with hyperglycemia: Secondary | ICD-10-CM

## 2020-04-09 DIAGNOSIS — Z794 Long term (current) use of insulin: Secondary | ICD-10-CM

## 2020-04-09 MED ORDER — FLUTICASONE FUROATE-VILANTEROL 100-25 MCG/INH IN AEPB: 1.0000 | INHALATION_SPRAY | Freq: Every day | RESPIRATORY_TRACT | 0 refills | Status: DC

## 2020-04-09 MED ORDER — ALBUTEROL SULFATE HFA 108 (90 BASE) MCG/ACT IN AERS: 2.0000 | INHALATION_SPRAY | Freq: Four times a day (QID) | RESPIRATORY_TRACT | 3 refills | Status: AC | PRN

## 2020-04-09 MED ORDER — ALBUTEROL SULFATE HFA 108 (90 BASE) MCG/ACT IN AERS: 2.0000 | INHALATION_SPRAY | Freq: Four times a day (QID) | RESPIRATORY_TRACT | 3 refills | Status: DC | PRN

## 2020-04-09 MED ORDER — ATORVASTATIN CALCIUM 10 MG PO TABS: 10.0000 mg | ORAL_TABLET | Freq: Every day | ORAL | 3 refills | Status: DC

## 2020-04-09 NOTE — Assessment & Plan Note (Signed)
Lipids are up  ? If compliant with atorvastatin  Discussed importance of this Disc goals for lipids and reasons to control them Rev last labs with pt Rev low sat fat diet in detail

## 2020-04-09 NOTE — Assessment & Plan Note (Signed)
No recent seizures-family is unsure Declines medication or f/u with neuro at this time

## 2020-04-09 NOTE — Assessment & Plan Note (Signed)
Using 02 at night only  Continues to smoke-not interested in quitting Also unfortunately refuses all immunizations Refilled albuterol mdi and also breo ellipta Due for f/u with pulmonary -ref done

## 2020-04-09 NOTE — Assessment & Plan Note (Signed)
Disc in detail risks of smoking and possible outcomes including copd, vascular/ heart disease, cancer , respiratory and sinus infections  Pt voices understanding Pt has COPD Not interested in cessation

## 2020-04-09 NOTE — Assessment & Plan Note (Signed)
Reviewed health habits including diet and exercise and skin cancer prevention Reviewed appropriate screening tests for age  Also reviewed health mt list, fam hx and immunization status , as well as social and family history   See HPI Declines all imms incl covid /cannot convince /disc risks  Declines dexa - is high risk for OP given his smoking (enc to start vit D) Given materials to work on Insurance underwriter Some progressive memory/cognitive issues-enc him to f/u with neuro (high risk for stroke and h/o seizures)  Reviewed care team  Failed vision exam- ref made to opthy Failed hearing screen-declines further eval  Some features of depression- pt refuses counseling referral or medication for this  Foot care is not optimal-suggested help with nails and skin (he declines podiatry referral)

## 2020-04-09 NOTE — Patient Instructions (Addendum)
I want you to take vitamin D3 over the couner 2000 iu daily   Our office will call you to set up an opthy exam/diabetic eye exam  Also a pulmonary visit   Scrub feet and trim nails and get new shoes  If you want to see a podiatrist let us know   I highly recommend vaccinations if you change your mind   Think about quit smoking

## 2020-04-09 NOTE — Assessment & Plan Note (Signed)
Pt has poor understanding of medications and not reliable for taking them  Does not wish to quit smoking  Declines all imms  Poor self care

## 2020-04-09 NOTE — Assessment & Plan Note (Signed)
Pt has been off of amlodipine and currently not taking anything bp in fair control at this time  BP Readings from Last 1 Encounters:  04/09/20 118/74   No changes needed Most recent labs reviewed  Disc lifstyle change with low sodium diet and exercise   Will continue to watch- took amlodipine off med list

## 2020-04-09 NOTE — Assessment & Plan Note (Signed)
Needs vision exam and DM eye exam  Referral made

## 2020-04-09 NOTE — Progress Notes (Signed)
Subjective:    Patient ID: Jonathan [name], male    DOB: Jul 01, 1941, 79 y.o.   MRN: [id number]  This visit occurred during the SARS-CoV-2 public health emergency.  Safety protocols were in place, including screening questions prior to the visit, additional usage of staff PPE, and extensive cleaning of exam room while observing appropriate contact time as indicated for disinfecting solutions.    HPI Pt presents for amw and health mt exam   I have personally reviewed the Medicare Annual Wellness questionnaire and have noted 1. The patient's medical and social history 2. Their use of alcohol, tobacco or illicit drugs 3. Their current medications and supplements 4. The patient's functional ability including ADL's, fall risks, home safety risks and hearing or visual             impairment. 5. Diet and physical activities 6. Evidence for depression or mood disorders  The patients weight, height, BMI have been recorded in the chart and visual acuity is per eye clinic.  I have made referrals, counseling and provided education to the patient based review of the above and I have provided the pt with a written personalized care plan for preventive services. Reviewed and updated provider list, see scanned forms.  See scanned forms.  Routine anticipatory guidance given to patient.  See health maintenance. Colon cancer screening-out aged   Flu vaccine-declines  Tetanus vaccine -postponed for financial  Pneumovax-declines  Zoster vaccine- declines  covid status - declines vaccination  Hep C screening -declines   Dexa- declines  Falls- none Fractures-none  Supplements-none Exercise -not much due to breathing  Prostate health:  Out aged psa  No problems that he knows of  Nocturia 2 times at night (drinks a lot of water)   Advance directive-does not have  Given materials to work on   Cognitive function addressed- see scanned forms- and if abnormal then additional documentation follows.    He has noticed short term memory issues - recently  Having some trouble with finances  Has not become lost  Worse if he is tired  Has occasional drooling  Has slurred speech in the past-improved   Care team: PCP-[facility] [place]   PMH and [facility] reviewed  Meds, vitals, and allergies reviewed.   ROS: See HPI.  Otherwise negative.    Weight : Wt Readings from Last 3 Encounters:  04/09/20 159 lb 8 oz (72.3 kg)  03/15/20 158 lb 8 oz (71.9 kg)  01/12/20 155 lb (70.3 kg)   26.54 kg/m  Appetite is fair   Smoking- continues - about 1ppd  No intent to quit   Hearing/vision:  Hearing Screening   _0  _1  _2  _3  _4  _5  _6  _7  _8   Right ear:   0 0 0  0    Left ear:   40 0 0  0      Visual Acuity Screening   Right eye Left eye Both eyes  Without correction: 20/50 20/100 20/50  With correction:      Per pt cataracts  Also has old eye injury   He does not notice hearing issues right now  Declines audiology eval    Depression screening -pos in the past  Is down at times  Declines counseling or medication    He had a recent course of keflex for cellulitis of leg   His R foot swells occasionally  Has cut back on salt   HTN bp is stable today  No  cp or palpitations or headaches or edema  No side effects to medicines  BP Readings from Last 3 Encounters:  04/09/20 118/74  03/15/20 114/68  01/12/20 122/60    Out of amlodipine  Unsure how long  Does not seem to need it    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 If seizures lately  Does not want to take seizure medicine or see neuro back    Hyperlipidemia  Lab Results  Component Value Date   CHOL 187 03/30/2020   CHOL 196 12/24/2018   CHOL 133 01/14/2018   Lab Results  Component Value Date   HDL 42 03/30/2020   HDL 42.10 12/24/2018   HDL 38.90 (L) 01/14/2018   Lab Results  Component Value Date   LDLCALC 105 (H) 03/30/2020   LDLCALC 58 09/01/2014   LDLCALC 37 12/15/2013   Lab Results  Component Value Date   TRIG 298 (H) 03/30/2020   TRIG 289.0 (H) 12/24/2018   TRIG 388.0 (H) 01/14/2018   Lab Results  Component Value Date   CHOLHDL 4.5 03/30/2020   CHOLHDL 5 12/24/2018   CHOLHDL 3 01/14/2018   Lab Results  Component Value Date   LDLDIRECT 118.0 12/24/2018   LDLDIRECT 70.0 01/14/2018   LDLDIRECT 106.0 11/25/2016   Taking atorvastatin  Unsure if he has missed doses   Lab Results  Component Value Date   WBC 8.8 03/30/2020   HGB 17.7 (H) 03/30/2020   HCT 54.4 (H) 03/30/2020   MCV 89.2 03/30/2020   PLT 242 03/30/2020   Hb elevated most likely from smoking   Lab Results  Component Value Date   TSH 2.79 03/30/2020    Lab Results  Component Value Date   ALT 9 03/30/2020   AST 12 03/30/2020   ALKPHOS 76 11/25/2016   BILITOT 0.9 03/30/2020    Patient Active Problem List   Diagnosis Date Noted  . Medicare annual wellness visit, subsequent 04/09/2020  . Vision changes 04/09/2020  . Routine general medical examination at a health care facility 11/25/2016  . Localization-related symptomatic epilepsy and epileptic syndromes with complex partial seizures, not intractable, without status epilepticus ([place]) 05/06/2016  . Intracranial vascular stenosis 05/06/2016  . History of stroke 05/06/2016  . Type 2 diabetes mellitus with hyperglycemia, with long-term current use of insulin ([place]) 12/04/2015  . History of shingles  09/08/2014  . Medically noncompliant 05/05/2014  . Chronic respiratory failure ([place]) 12/15/2013  . Immunization not carried out because of patient refusal 07/05/2012  . Hyperlipidemia associated with type 2 diabetes mellitus ([place])   . Pulmonary nodule, right   . Allergic rhinitis   . ED (erectile dysfunction)   . Tobacco abuse   . COPD (chronic obstructive pulmonary disease) ([place]) 03/29/2010  . Essential hypertension 01/05/2007   Past Medical History:  Diagnosis Date  . Allergic rhinitis   . Asthma   . COPD (chronic obstructive pulmonary disease) ([place])   .  Dermatitis seborrheica   . Diabetes mellitus type II   . ED (erectile dysfunction)   . Frozen shoulder   . HLD (hyperlipidemia)   . HTN (hypertension)   . Hyperkalemia   . Kidney stone   . Other diseases of lung, not elsewhere classified   . Pulmonary nodule, right    lower lobe  . Rotator cuff injury    right  . Shoulder pain   . Stroke [facility])    pt states "last year"  . Tobacco abuse    Past Surgical History:  Procedure Laterality Date  . CERVICAL DISCECTOMY  1998  . EEG    . ETT  11/1994  . TEE WITHOUT CARDIOVERSION  12/03/2011   Procedure: TRANSESOPHAGEAL ECHOCARDIOGRAM (TEE);  Surgeon: Lelon Perla, MD;  Location: [facility] END[facility]COPY;  Service: Cardiovascular;  Laterality: N/A;  . TM repair     Social History   Tobacco Use  . Smoking status: Current Every Day Smoker    Packs/day: 1.00    Types: Cigarettes  . Smokeless tobacco: Never Used  Substance Use Topics  . Alcohol use: No    Alcohol/week: 0.0 standard drinks    Comment: Quit 40 years ago  . Drug use: No   Family History  Problem Relation Age of Onset  . Asthma Sister   . Emphysema Sister   . Diabetes Mother   . Diabetes Brother   . Heart disease Brother    Allergies  Allergen Reactions  . Amoxicillin-Pot Clavulanate Other (See Comments)    Severe stomach pain.   . Quinapril Hcl     REACTION: back pain  . Valsartan     REACTION: back pain    . Varenicline Tartrate     REACTION: AMS, hallucinations, night mares   Current Outpatient Medications on File Prior to Visit  Medication Sig Dispense Refill  . ACCU-CHEK GUIDE test strip USE AS INSTRUCTED TO TEST 4 TIMES DAILY DX:E11.65 100 strip 11  . albuterol (PROVENTIL) (2.5 MG/3ML) 0.083% nebulizer solution Take 3 mLs (2.5 mg total) every 4 (four) hours as needed by nebulization for shortness of breath. 75 mL 0  . aspirin 81 MG tablet Take 1 tablet (81 mg total) by mouth daily. 30 tablet 0  . HUMALOG KWIKPEN 100 UNIT/ML KwikPen INJECT 8-14 UNITS INTO THE SKIN 3 TIMES DAILY. 15 mL 4  . Insulin Glargine (LANTUS) 100 UNIT/ML Solostar Pen Inject 28-30 Units into the skin daily with supper. 15 mL 11  . Insulin Pen Needle (BD PEN NEEDLE NANO U/F) 32G X 4 MM MISC Use to inject insulin 4 times a day. 100 each 2  . Lancets Misc. (ACCU-CHEK FASTCLIX LANCET) KIT 1 each by Does not apply route QID. DX: E11.65 400 kit 0  . metFORMIN (GLUCOPHAGE) 1000 MG tablet TAKE ONE TABLET BY MOUTH  TWICE DAILY 180 tablet 3   No current facility-administered medications on file prior to visit.     Review of Systems  Constitutional: Positive for fatigue. Negative for activity change, appetite change, fever and unexpected weight change.  HENT: Negative for congestion, rhinorrhea, sore throat and trouble swallowing.   Eyes: Negative for pain, redness, itching and visual disturbance.  Respiratory: Positive for shortness of breath. Negative for cough, chest tightness and wheezing.        Sob on exertion  No wheezing today  Cardiovascular: Positive for leg swelling. Negative for chest pain and palpitations.       Some ankle edema  Gastrointestinal: Negative for abdominal pain,  blood in stool, constipation, diarrhea and nausea.  Endocrine: Negative for cold intolerance, heat intolerance, polydipsia and polyuria.  Genitourinary: Negative for difficulty urinating, dysuria, frequency and urgency.  Musculoskeletal:  Negative for arthralgias, joint swelling and myalgias.  Skin: Negative for pallor and rash.  Neurological: Negative for dizziness, tremors, weakness, numbness and headaches.  Hematological: Negative for adenopathy. Does not bruise/bleed easily.  Psychiatric/Behavioral: Positive for dysphoric mood. Negative for decreased concentration. The patient is not nervous/anxious.        Decreased short term memory with slowed cognition   No confusion         Objective:   Physical Exam Constitutional:      General: He is not in acute distress.    Appearance: Normal appearance. He is well-developed and normal weight. He is not ill-appearing or diaphoretic.  HENT:     Head: Normocephalic and atraumatic.     Right Ear: Tympanic membrane, ear canal and external ear normal.     Left Ear: Tympanic membrane, ear canal and external ear normal.     Nose: Nose normal. No congestion.     Mouth/Throat:     Mouth: Mucous membranes are moist.     Pharynx: Oropharynx is clear. No posterior oropharyngeal erythema.  Eyes:     General: No scleral icterus.       Right eye: No discharge.        Left eye: No discharge.     Conjunctiva/sclera: Conjunctivae normal.     Pupils: Pupils are equal, round, and reactive to light.  Neck:     Thyroid: No thyromegaly.     Vascular: No carotid bruit or JVD.  Cardiovascular:     Rate and Rhythm: Normal rate and regular rhythm.     Pulses: Normal pulses.     Heart sounds: Normal heart sounds. No gallop.   Pulmonary:     Effort: Pulmonary effort is normal. No respiratory distress.     Breath sounds: Normal breath sounds. No wheezing or rales.     Comments: Good air exch Chest:     Chest wall: No tenderness.  Abdominal:     General: Bowel sounds are normal. There is no distension or abdominal bruit.     Palpations: Abdomen is soft. There is no mass.     Tenderness: There is no abdominal tenderness.     Hernia: No hernia is present.  Musculoskeletal:         General: No tenderness.     Cervical back: Normal range of motion and neck supple. No rigidity. No muscular tenderness.     Right lower leg: Edema present.     Left lower leg: Edema present.     Comments: Kyphosis   Trace ankle edema bilat -not pitting   Varicosities in both veins   Lymphadenopathy:     Cervical: No cervical adenopathy.  Skin:    General: Skin is warm and dry.     Coloration: Skin is not pale.     Findings: No erythema or rash.     Comments: Solar aging with keratoses and lentigines Some scratches on arms bilat  Think skin /easy bruising   Nails are overgrown and brown in color   Feet have excessive built up dry skin but no ulcers or cracking   Neurological:     Mental Status: He is alert.     Cranial Nerves: No cranial nerve deficit.     Motor: No abnormal muscle tone.     Coordination: Coordination  normal.     Gait: Gait normal.     Deep Tendon Reflexes: Reflexes are normal and symmetric. Reflexes normal.     Comments: Speech is not slurred  No facial droop  Psychiatric:        Mood and Affect: Mood is depressed.        Cognition and Memory: He exhibits impaired recent memory.     Comments: Memory and cognition have slowed since last visit   Mood is generally depressed and irritable            Assessment & Plan:   Problem List Items Addressed This Visit      Cardiovascular and Mediastinum   Essential hypertension    Pt has been off of amlodipine and currently not taking anything bp in fair control at this time  BP Readings from Last 1 Encounters:  04/09/20 118/74   No changes needed Most recent labs reviewed  Disc lifstyle change with low sodium diet and exercise   Will continue to watch- took amlodipine off med list      Relevant Medications   atorvastatin (LIPITOR) 10 MG tablet     Respiratory   COPD (chronic obstructive pulmonary disease) ([place])    Using 02 at night only  Continues to smoke-not interested in quitting Also  unfortunately refuses all immunizations Refilled albuterol mdi and also breo ellipta Due for f/u with pulmonary -ref done      Relevant Medications   fluticasone furoate-vilanterol (BREO ELLIPTA) 100-25 MCG/INH AEPB   albuterol (VENTOLIN HFA) 108 (90 Base) MCG/ACT inhaler   Other Relevant Orders   Ambulatory referral to Pulmonology   Chronic respiratory failure ([place])    Due for pulmonary f/u  Some sob on exertion  ? If hypoxia affects mental status at times   Due for pulm f/u-will refer       Relevant Orders   Ambulatory referral to Pulmonology     Endocrine   Hyperlipidemia associated with type 2 diabetes mellitus ([place])    Lipids are up  ? If compliant with atorvastatin  Discussed importance of this Disc goals for lipids and reasons to control them Rev last labs with pt Rev low sat fat diet in detail       Relevant Medications   atorvastatin (LIPITOR) 10 MG tablet   Type 2 diabetes mellitus with hyperglycemia, with long-term current use of insulin ([place])    For f/u with endocrinology tomorrow  Increased blood glucose levels lately      Relevant Medications   atorvastatin (LIPITOR) 10 MG tablet   Other Relevant Orders   Ambulatory referral to Ophthalmology     Nervous and Auditory   Localization-related symptomatic epilepsy and epileptic syndromes with complex partial seizures, not intractable, without status epilepticus ([place])    No recent seizures-family is unsure Declines medication or f/u with neuro at this time         Other   Tobacco abuse    Disc in detail risks of smoking and possible outcomes including copd, vascular/ heart disease, cancer , respiratory and sinus infections  Pt voices understanding Pt has COPD Not interested in cessation       Medically noncompliant    Pt has poor understanding of medications and not reliable for taking them  Does not wish to quit smoking  Declines all imms  Poor self care        Routine general medical  examination at a health care facility    Reviewed health habits  including diet and exercise and skin cancer prevention Reviewed appropriate screening tests for age  Also reviewed health mt list, fam hx and immunization status , as well as social and family history   See HPI Declines all imms incl covid /cannot convince /disc risks  Declines dexa - is high risk for OP given his smoking (enc to start vit D) Given materials to work on Insurance underwriter Some progressive memory/cognitive issues-enc him to f/u with neuro (high risk for stroke and h/o seizures)  Reviewed care team  Failed vision exam- ref made to opthy Failed hearing screen-declines further eval  Some features of depression- pt refuses counseling referral or medication for this  Foot care is not optimal-suggested help with nails and skin (he declines podiatry referral)            Medicare annual wellness visit, subsequent - Primary    Reviewed health habits including diet and exercise and skin cancer prevention Reviewed appropriate screening tests for age  Also reviewed health mt list, fam hx and immunization status , as well as social and family history   See HPI Declines all imms incl covid /cannot convince /disc risks  Declines dexa - is high risk for OP given his smoking (enc to start vit D) Given materials to work on Insurance underwriter Some progressive memory/cognitive issues-enc him to f/u with neuro (high risk for stroke and h/o seizures)  Reviewed care team  Failed vision exam- ref made to opthy Failed hearing screen-declines further eval  Some features of depression- pt refuses counseling referral or medication for this  Foot care is not optimal-suggested help with nails and skin (he declines podiatry referral)       Vision changes    Needs vision exam and DM eye exam  Referral made      Relevant Orders   Ambulatory referral to Ophthalmology

## 2020-04-09 NOTE — Assessment & Plan Note (Signed)
Due for pulmonary f/u  Some sob on exertion  ? If hypoxia affects mental status at times   Due for pulm f/u-will refer

## 2020-04-09 NOTE — Assessment & Plan Note (Signed)
For f/u with endocrinology tomorrow  Increased blood glucose levels lately

## 2020-04-10 ENCOUNTER — Encounter: Payer: Self-pay | Admitting: Internal Medicine

## 2020-04-10 ENCOUNTER — Other Ambulatory Visit: Payer: Self-pay

## 2020-04-10 ENCOUNTER — Ambulatory Visit (INDEPENDENT_AMBULATORY_CARE_PROVIDER_SITE_OTHER): Payer: Medicare Other | Admitting: Internal Medicine

## 2020-04-10 VITALS — BP 110/80 | HR 88 | Ht 65.0 in | Wt 159.0 lb

## 2020-04-10 DIAGNOSIS — E785 Hyperlipidemia, unspecified: Secondary | ICD-10-CM

## 2020-04-10 DIAGNOSIS — E1165 Type 2 diabetes mellitus with hyperglycemia: Secondary | ICD-10-CM

## 2020-04-10 DIAGNOSIS — Z794 Long term (current) use of insulin: Secondary | ICD-10-CM

## 2020-04-10 LAB — POCT GLYCOSYLATED HEMOGLOBIN (HGB A1C): Hemoglobin A1C: 12.4 % — AB (ref 4.0–5.6)

## 2020-04-10 MED ORDER — INSULIN GLARGINE 100 UNIT/ML SOLOSTAR PEN: 42.0000 [IU] | PEN_INJECTOR | Freq: Every day | SUBCUTANEOUS | 11 refills | Status: AC

## 2020-04-10 NOTE — Addendum Note (Signed)
Addended by: Cardell Peach I on: 04/10/2020 03:59 PM   Modules accepted: Orders

## 2020-04-10 NOTE — Patient Instructions (Addendum)
Please continue: - Metformin 1000 mg 2x a day   Please increase: - Lantus 42 units in the evening, around dinnertime  Please continue: - Humalog 3x a day, 10-15 min before meals - 10 units before a smaller meal - 12-14 units before a regular meal - 16 units before a large meal  Please take 5-6 units of Humalog before the snack at night, but ideally no snacks when dark outside.  Please stop at the lab.  Please return in 4 months with your sugar log.

## 2020-04-10 NOTE — Progress Notes (Addendum)
Patient ID: Jonathan [name], male   DOB: 02-02-1941, 79 y.o.   MRN: [id number]  This visit occurred during the SARS-CoV-2 public health emergency.  Safety protocols were in place, including screening questions prior to the visit, additional usage of staff PPE, and extensive cleaning of exam room while observing appropriate contact time as indicated for disinfecting solutions.   Jonathan [name] is a 79 y.o.-year-old male, returning for follow-up for DM2, dx 1990, uncontrolled, insulin-dep since ~3532, without complications (h/o stroke, also mild CKD). Last visit 4 months ago.  He is here with his step daughter who offers part of the history about his dietary habits, activity, and blood sugars.  He had a very high blood sugar at 446 after last visit.  Reviewed HbA1c levels: 09/13/2019: HbA1c calculated from fructosamine: 7% Lab Results  Component Value Date   HGBA1C 10.0 (A) 09/13/2019   HGBA1C 10.0 (A) 12/24/2018   HGBA1C 9.3 (A) 08/24/2018  05/17/2019: HbA1c calculated from fructosamine: 6.47%, improved. 12/24/2018: HbA1c calculated from fructosamine 7.22%, slightly higher than before 08/24/2018: Hba1c calculated from fructosamine has improved to 6.8%. 04/23/2018: HbA1c calculated from fructosamine is higher, at 7.4%. 01/14/2018: HbA1c calculated from the fructosamine is better, at 6.7%. 10/16/2017: HbA1c calculated from the fructosamine is better, at 7.18%. 08/14/2017: HbA1c calculated from the fructosamine is 7.5% 05/14/2017: HbA1c calculated from the fructosamine is 7.4% 01/27/2017: HbA1c calculated from the fructosamine is great: 6.2% 10/30/2016: HbA1c calculated from the fructosamine is great, at 6.5% 07/31/2016: HbA1c calculated from the fructosamine is great, at 6.15%  He is on: - Metformin 1000 mg 2x a day  - Lantus 35 units in the evening, around dinnertime - Humalog 3x a day, 10-15 min before meals - 10 units before a smaller meal - 12-14 units before a regular meal - 16 units  before a large meal  He checks his sugars 3 times a day: - am:  120-190, 205 (he is up all night - snack at 3 am) >> 140, 174-282, 336 >> 297-358 - 2h after b'fast: n/c  - lunch: 110-182, 192 >> 91, 100-190, 200, 215 >> 212-321 >> 111, 214-355 - 2h after lunch: n/c >> 133 - dinner: 108-194 >> 90-180, 200 >> 107-195, 210 >> n/c - 2h after dinner: 173-284  >> n/c >> 80, 137-194, 201 >> 212-289 >> 184-303 - bedtime: n/c Lowest  90 >> 73 >> 80 >> 140 >> 111 Highest  349 >> 210 >> 215 >> 336 >> 445.  + Stage II-III CKD, last BUN/creatinine: Lab Results  Component Value Date   BUN 16 03/30/2020   CREATININE 1.11 03/30/2020   Lab Results  Component Value Date   GFR 59.62 (L) 12/27/2018   MICRALBCREAT 7.6 09/15/2016  He is allergic to ARB/ACE inhibitor's.  ACR levels normal: Lab Results  Component Value Date   MICRALBCREAT 7.6 09/15/2016   MICRALBCREAT 12.7 04/28/2014   MICRALBCREAT 25.3 12/17/2009   MICRALBCREAT 22.6 02/13/2009   + HL: Lab Results  Component Value Date   CHOL 187 03/30/2020   HDL 42 03/30/2020   LDLCALC 105 (H) 03/30/2020   LDLDIRECT 118.0 12/24/2018   TRIG 298 (H) 03/30/2020   CHOLHDL 4.5 03/30/2020  On Lipitor. - Last eye exam was in 03/2018: No DR, + cataract - no numbness and tingling in feet.  He has a history of hyperkalemia -1 episode, after which his potassium levels normalized: Lab Results  Component Value Date   K 4.1 03/30/2020   K 5.0 05/17/2019   K  4.7 12/27/2018   K 5.7 (H) 12/24/2018   K 4.5 12/12/2017   He still smokes 1/2-3/4 PPD.  He is still very short of breath and cannot perform any type of activity and has back pain so he is unstable on his feet.  He is in a wheelchair.  Around early 2015, he was hospitalized after syncope, was in ICU, was on assisted ventilation, expressive aphasia recovered- believed to have a stroke, now better.  His wife has stage III lung cancer.  ROS: Constitutional: no weight gain/+ weight loss, +  fatigue, no subjective hyperthermia, no subjective hypothermia Eyes: no blurry vision, no xerophthalmia ENT: no sore throat, no nodules palpated in neck, no dysphagia, no odynophagia, no hoarseness Cardiovascular: no CP/+ SOB/no palpitations/no leg swelling Respiratory: no cough/+ SOB/no wheezing Gastrointestinal: no N/no V/no D/no C/no acid reflux Musculoskeletal: +muscle aches/+ joint aches Skin: no rashes, no hair loss Neurological: no tremors/no numbness/no tingling/no dizziness, + imbalance  I reviewed pt's medications, allergies, PMH, social hx, family hx, and changes were documented in the history of present illness. Otherwise, unchanged from my initial visit note.  Past Medical History:  Diagnosis Date  . Allergic rhinitis   . Asthma   . COPD (chronic obstructive pulmonary disease) ([place])   . Dermatitis seborrheica   . Diabetes mellitus type II   . ED (erectile dysfunction)   . Frozen shoulder   . HLD (hyperlipidemia)   . HTN (hypertension)   . Hyperkalemia   . Kidney stone   . Other diseases of lung, not elsewhere classified   . Pulmonary nodule, right    lower lobe  . Rotator cuff injury    right  . Shoulder pain   . Stroke [facility])    pt states "last year"  . Tobacco abuse    Past Surgical History:  Procedure Laterality Date  . CERVICAL DISCECTOMY  1998  . EEG    . ETT  11/1994  . TEE WITHOUT CARDIOVERSION  12/03/2011   Procedure: TRANSESOPHAGEAL ECHOCARDIOGRAM (TEE);  Surgeon: Lelon Perla, MD;  Location: [facility] ENDOSCOPY;  Service: Cardiovascular;  Laterality: N/A;  . TM repair     Social History   Socioeconomic History  . Marital status: Married    Spouse name: Not on file  . Number of children: Not on file  . Years of education: Not on file  . Highest education level: Not on file  Occupational History  . Occupation: Retired    Comment: Air cabin crew  Tobacco Use  . Smoking status: Current Every Day Smoker    Packs/day: 1.00    Types: Cigarettes  .  Smokeless tobacco: Never Used  Substance and Sexual Activity  . Alcohol use: No    Alcohol/week: 0.0 standard drinks    Comment: Quit 40 years ago  . Drug use: No  . Sexual activity: Never    Partners: Female  Other Topics Concern  . Not on file  Social History Narrative  . Not on file   Social Determinants of Health   Financial Resource Strain:   . Difficulty of Paying Living Expenses:   Food Insecurity:   . Worried About Charity fundraiser in the Last Year:   . Arboriculturist in the Last Year:   Transportation Needs:   . Film/video editor (Medical):   [name] Lack of Transportation (Non-Medical):   Physical Activity:   . Days of Exercise per Week:   . Minutes of Exercise per Session:   Stress:   .  Feeling of Stress :   Social Connections:   . Frequency of Communication with Friends and Family:   . Frequency of Social Gatherings with Friends and Family:   . Attends Religious Services:   . Active Member of Clubs or Organizations:   . Attends Archivist Meetings:   [name] Marital Status:   Intimate Partner Violence:   . Fear of Current or Ex-Partner:   . Emotionally Abused:   [name] Physically Abused:   . Sexually Abused:    Current Outpatient Medications on File Prior to Visit  Medication Sig Dispense Refill  . ACCU-CHEK GUIDE test strip USE AS INSTRUCTED TO TEST 4 TIMES DAILY DX:E11.65 100 strip 11  . albuterol (PROVENTIL) (2.5 MG/3ML) 0.083% nebulizer solution Take 3 mLs (2.5 mg total) every 4 (four) hours as needed by nebulization for shortness of breath. 75 mL 0  . albuterol (VENTOLIN HFA) 108 (90 Base) MCG/ACT inhaler Inhale 2 puffs into the lungs every 6 (six) hours as needed for wheezing. 18 g 3  . aspirin 81 MG tablet Take 1 tablet (81 mg total) by mouth daily. 30 tablet 0  . atorvastatin (LIPITOR) 10 MG tablet Take 1 tablet (10 mg total) by mouth daily. 90 tablet 3  . fluticasone furoate-vilanterol (BREO ELLIPTA) 100-25 MCG/INH AEPB Inhale 1 puff into the  lungs daily. 90 each 0  . HUMALOG KWIKPEN 100 UNIT/ML KwikPen INJECT 8-14 UNITS INTO THE SKIN 3 TIMES DAILY. 15 mL 4  . Insulin Glargine (LANTUS) 100 UNIT/ML Solostar Pen Inject 28-30 Units into the skin daily with supper. 15 mL 11  . Insulin Pen Needle (BD PEN NEEDLE NANO U/F) 32G X 4 MM MISC Use to inject insulin 4 times a day. 100 each 2  . Lancets Misc. (ACCU-CHEK FASTCLIX LANCET) KIT 1 each by Does not apply route QID. DX: E11.65 400 kit 0  . metFORMIN (GLUCOPHAGE) 1000 MG tablet TAKE ONE TABLET BY MOUTH  TWICE DAILY 180 tablet 3   No current facility-administered medications on file prior to visit.   Allergies  Allergen Reactions  . Amoxicillin-Pot Clavulanate Other (See Comments)    Severe stomach pain.   . Quinapril Hcl     REACTION: back pain  . Valsartan     REACTION: back pain  . Varenicline Tartrate     REACTION: AMS, hallucinations, night mares   Family History  Problem Relation Age of Onset  . Asthma Sister   . Emphysema Sister   . Diabetes Mother   . Diabetes Brother   . Heart disease Brother    PE: BP 110/80   Pulse 88   Ht _0  (1.651 m)   Wt 159 lb (72.1 kg)   SpO2 95%   BMI 26.46 kg/m  Body mass index is 26.46 kg/m. Wt Readings from Last 3 Encounters:  04/10/20 159 lb (72.1 kg)  04/09/20 159 lb 8 oz (72.3 kg)  03/15/20 158 lb 8 oz (71.9 kg)   Constitutional: overweight, in NAD, in wheelchair Eyes: PERRLA, EOMI, no exophthalmos ENT: moist mucous membranes, no thyromegaly, no cervical lymphadenopathy Cardiovascular: RRR, No MRG Respiratory: CTA B Gastrointestinal: abdomen soft, NT, ND, BS+ Musculoskeletal: no deformities, strength intact in all 4 Skin: moist, warm, no rashes Neurological: no tremor with outstretched hands, DTR normal in all 4  ASSESSMENT: 1. DM2, insulin-dependent, uncontrolled, with complications - Cerebrovascular disease-h/o stroke  2. HL  PLAN:  1. Patient with longstanding, uncontrolled, type 2 diabetes, on basal-bolus  insulin regimen and Metformin.  He  was initially noncompliant with insulin doses and sugar checks blood sugars improved along with his HbA1c when he started to check sugars more consistently and take insulin as prescribed.  At last visit, however, he brought a log that was not dated, with multiple CBGs but it was unclear when they were checked.  He felt that his sugars were higher in the previous few months and he could not explain them.  He did not feel his insulin was degraded he did not change his diet or activity and did not feel sick.  We increased his Lantus and his Humalog, but I advised him against missing doses, which I believe was happening. -Reviewed latest HbA1c calculated from fructosamine, which was actually 7%, in vast contrast to the directly measured HbA1c which was 10% - at this visit, his sugars are very high as she continues to snack at night and not covering the snacks with insulin as advised. Advised him again to cover these with a lower Novlog dose and will also increase Lantus as his am sugars are the highest of the day. For now, continue the same Humalog dose and I advised him and his daughetr how to adjust the dose based on the meals -I also advised him to try to stay active and daughter will make sure that he goes out in the yard and about. - I suggested to:  Patient Instructions  Please continue: - Metformin 1000 mg 2x a day   Please increase: - Lantus 42 units in the evening, around dinnertime  Please continue: - Humalog 3x a day, 10-15 min before meals - 10 units before a smaller meal - 12-14 units before a regular meal - 16 units before a large meal  Please take 5-6 units of Humalog before the snack at night, but ideally no snacks when dark outside.  Please stop at the lab.  Please return in 4 months with your sugar log.   - we will check a fructosamine level today. HbA1c 12.4% (higher).   - advised to check sugars at different times of the day - 3-4x a day,  rotating check times - advised for yearly eye exams >> he is UTD - return to clinic in 4 months    2.  Hyperlipidemia -Reviewed lipid panel from earlier this month: LDL improved, but still above goal, triglycerides high: Lab Results  Component Value Date   CHOL 187 03/30/2020   HDL 42 03/30/2020   LDLCALC 105 (H) 03/30/2020   LDLDIRECT 118.0 12/24/2018   TRIG 298 (H) 03/30/2020   CHOLHDL 4.5 03/30/2020  -Continues Lipitor without side effects  Component     Latest Ref Rng & Units 04/10/2020  Hemoglobin A1C     4.0 - 5.6 % 12.4 (A)  Fructosamine     205 - 285 umol/L 423 (H)   HbA1c calc. From fructosamine is 8.8%, but in this case, the directly measured HbA1c appears to be more accurate for him.  Philemon Kingdom, MD PhD [facility] Endocrinology

## 2020-04-15 LAB — FRUCTOSAMINE: Fructosamine: 423 umol/L — ABNORMAL HIGH (ref 205–285)

## 2020-04-18 ENCOUNTER — Telehealth: Payer: Self-pay

## 2020-04-18 NOTE — Telephone Encounter (Signed)
Called patient, it rang a few times then sounded like someone answered, however, I could only hear what sounded like a tv in the background for several minutes.

## 2020-04-18 NOTE — Telephone Encounter (Signed)
-----   Message from Philemon Kingdom, MD sent at 04/15/2020  4:37 PM EDT ----- Lenna Sciara, can you please call pt: HbA1c calc. From fructosamine is 8.8%, but in this case, the directly measured HbA1c appears to be more accurate for him.

## 2020-05-03 ENCOUNTER — Other Ambulatory Visit: Payer: Self-pay

## 2020-05-03 ENCOUNTER — Encounter: Payer: Self-pay | Admitting: Pulmonary Disease

## 2020-05-03 ENCOUNTER — Ambulatory Visit: Payer: Medicare Other | Admitting: Pulmonary Disease

## 2020-05-03 VITALS — BP 114/72 | HR 88 | Temp 97.5°F | Ht 65.0 in | Wt 157.4 lb

## 2020-05-03 DIAGNOSIS — F17210 Nicotine dependence, cigarettes, uncomplicated: Secondary | ICD-10-CM

## 2020-05-03 DIAGNOSIS — J449 Chronic obstructive pulmonary disease, unspecified: Secondary | ICD-10-CM

## 2020-05-03 DIAGNOSIS — R0602 Shortness of breath: Secondary | ICD-10-CM

## 2020-05-03 MED ORDER — BREZTRI AEROSPHERE 160-9-4.8 MCG/ACT IN AERO: 2.0000 | INHALATION_SPRAY | Freq: Two times a day (BID) | RESPIRATORY_TRACT | 0 refills | Status: AC

## 2020-05-03 NOTE — Patient Instructions (Addendum)
We are giving you a trial of Breztri 2 inhalations twice a day.  Do not use Breo while using Breztri.  We are arranging for breathing tests.  We are also going to check a test of your oxygen level at nighttime when you sleep.  We will see you in follow-up in 4 to 6 weeks time.  Let us know how you are doing with the [facility].

## 2020-05-03 NOTE — Progress Notes (Signed)
 Assessment & Plan:  1. SOB (shortness of breath) (Primary)  2. COPD, severe (HCC)  3. Tobacco dependence due to cigarettes   Patient Instructions  We are giving you a trial of Breztri  2 inhalations twice a day.  Do not use Breo while using Breztri .  We are arranging for breathing tests.  We are also going to check a test of your oxygen level at nighttime when you sleep.  We will see you in follow-up in 4 to 6 weeks time.  Let us  know how you are doing with the Breztri .     Please note: late entry documentation due to logistical difficulties during COVID-19 pandemic. This note is filed for information purposes only, and is not intended to be used for billing, nor does it represent the full scope/nature of the visit in question. Please see any associated scanned media linked to date of encounter for additional pertinent information.  Subjective:    HPI: Jonathan [name] is a 79 y.o. male presenting to the pulmonology clinic on 05/03/2020 with report of: pulmonary consult (per Dr. Henry sob with exertion, wheezing and occ non prod cough. )     Outpatient Encounter Medications as of 05/03/2020  Medication Sig Note   albuterol  (VENTOLIN  HFA) 108 (90 Base) MCG/ACT inhaler Inhale 2 puffs into the lungs every 6 (six) hours as needed for wheezing.    [EXPIRED] Budeson-Glycopyrrol-Formoterol  (BREZTRI  AEROSPHERE) 160-9-4.8 MCG/ACT AERO Inhale 2 puffs into the lungs in the morning and at bedtime for 1 day.    HUMALOG  KWIKPEN 100 UNIT/ML KwikPen INJECT 8-14 UNITS INTO THE SKIN 3 TIMES DAILY. (Patient taking differently: Inject 5-16 Units into the skin See admin instructions. Inject 3 times a day with meals and as needed with evening snack: - 10 units with a smaller meal - 12-14 units with a regular meal - 16 units with a larger meal - 5-6 units with evening snack)    insulin  glargine (LANTUS ) 100 UNIT/ML Solostar Pen Inject 42 Units into the skin at bedtime.    metFORMIN   (GLUCOPHAGE ) 1000 MG tablet TAKE ONE TABLET BY MOUTH  TWICE DAILY (Patient taking differently: Take 1,000 mg by mouth 2 (two) times daily with a meal. )    [DISCONTINUED] ACCU-CHEK GUIDE test strip USE AS INSTRUCTED TO TEST 4 TIMES DAILY DX:E11.65    [DISCONTINUED] albuterol  (PROVENTIL ) (2.5 MG/3ML) 0.083% nebulizer solution Take 3 mLs (2.5 mg total) every 4 (four) hours as needed by nebulization for shortness of breath.    [DISCONTINUED] aspirin  81 MG tablet Take 1 tablet (81 mg total) by mouth daily.    [DISCONTINUED] atorvastatin  (LIPITOR) 10 MG tablet Take 1 tablet (10 mg total) by mouth daily.    [DISCONTINUED] fluticasone  furoate-vilanterol (BREO ELLIPTA ) 100-25 MCG/INH AEPB Inhale 1 puff into the lungs daily. (Patient not taking: Reported on 05/10/2020) 05/10/2020: As of 05/03/2020 -- trial of Breztri  x4-6 weeks. Pt not to use Breo while using Breztri    [DISCONTINUED] Insulin  Pen Needle (BD PEN NEEDLE NANO U/F) 32G X 4 MM MISC Use to inject insulin  4 times a day.    [DISCONTINUED] Lancets Misc. (ACCU-CHEK FASTCLIX LANCET) KIT 1 each by Does not apply route QID. DX: E11.65    No facility-administered encounter medications on file as of 05/03/2020.      Objective:   Vitals:   05/03/20 1034  BP: 114/72  Pulse: 88  Temp: (!) 97.5 F (36.4 C)  Height: 5' 5 (1.651 m)  Weight: 157 lb 6.4 oz (71.4 kg)  SpO2: 95%  TempSrc: Temporal  BMI (Calculated): 26.19     Physical exam documentation is limited by delayed entry of information.

## 2020-05-10 ENCOUNTER — Emergency Department (HOSPITAL_COMMUNITY): Payer: Medicare Other

## 2020-05-10 ENCOUNTER — Encounter (HOSPITAL_COMMUNITY): Payer: Self-pay

## 2020-05-10 ENCOUNTER — Other Ambulatory Visit: Payer: Self-pay

## 2020-05-10 ENCOUNTER — Emergency Department (HOSPITAL_COMMUNITY)
Admission: EM | Admit: 2020-05-10 | Discharge: 2020-05-11 | Disposition: A | Discharge status: Home / Self Care | Payer: Medicare Other | Source: Home / Self Care | Attending: Emergency Medicine | Admitting: Emergency Medicine

## 2020-05-10 DIAGNOSIS — Z794 Long term (current) use of insulin: Secondary | ICD-10-CM | POA: Insufficient documentation

## 2020-05-10 DIAGNOSIS — U071 COVID-19: Secondary | ICD-10-CM | POA: Insufficient documentation

## 2020-05-10 DIAGNOSIS — J1282 Pneumonia due to coronavirus disease 2019: Secondary | ICD-10-CM | POA: Diagnosis present

## 2020-05-10 DIAGNOSIS — R4182 Altered mental status, unspecified: Secondary | ICD-10-CM | POA: Insufficient documentation

## 2020-05-10 DIAGNOSIS — Z23 Encounter for immunization: Secondary | ICD-10-CM | POA: Diagnosis not present

## 2020-05-10 DIAGNOSIS — X58XXXA Exposure to other specified factors, initial encounter: Secondary | ICD-10-CM | POA: Diagnosis not present

## 2020-05-10 DIAGNOSIS — Z7982 Long term (current) use of aspirin: Secondary | ICD-10-CM | POA: Insufficient documentation

## 2020-05-10 DIAGNOSIS — R0602 Shortness of breath: Secondary | ICD-10-CM

## 2020-05-10 DIAGNOSIS — E1165 Type 2 diabetes mellitus with hyperglycemia: Secondary | ICD-10-CM | POA: Insufficient documentation

## 2020-05-10 DIAGNOSIS — W010XXA Fall on same level from slipping, tripping and stumbling without subsequent striking against object, initial encounter: Secondary | ICD-10-CM | POA: Diagnosis not present

## 2020-05-10 DIAGNOSIS — J441 Chronic obstructive pulmonary disease with (acute) exacerbation: Secondary | ICD-10-CM | POA: Insufficient documentation

## 2020-05-10 DIAGNOSIS — F17210 Nicotine dependence, cigarettes, uncomplicated: Secondary | ICD-10-CM | POA: Insufficient documentation

## 2020-05-10 DIAGNOSIS — I10 Essential (primary) hypertension: Secondary | ICD-10-CM | POA: Insufficient documentation

## 2020-05-10 DIAGNOSIS — J45909 Unspecified asthma, uncomplicated: Secondary | ICD-10-CM | POA: Insufficient documentation

## 2020-05-10 LAB — PROCALCITONIN: Procalcitonin: <0.1 ng/mL

## 2020-05-10 LAB — CBC WITH DIFFERENTIAL/PLATELET
Abs Immature Granulocytes: 0.05 10*3/uL (ref 0.00–0.07)
Basophils Absolute: 0 10*3/uL (ref 0.0–0.1)
Basophils Relative: 0 %
Eosinophils Absolute: 0 10*3/uL (ref 0.0–0.5)
Eosinophils Relative: 1 %
HCT: 50.5 % (ref 39.0–52.0)
Hemoglobin: 16.8 g/dL (ref 13.0–17.0)
Immature Granulocytes: 1 %
Lymphocytes Relative: 13 %
Lymphs Abs: 1 10*3/uL (ref 0.7–4.0)
MCH: 29.9 pg (ref 26.0–34.0)
MCHC: 33.3 g/dL (ref 30.0–36.0)
MCV: 89.9 fL (ref 80.0–100.0)
Monocytes Absolute: 0.9 10*3/uL (ref 0.1–1.0)
Monocytes Relative: 12 %
Neutro Abs: 5.6 10*3/uL (ref 1.7–7.7)
Neutrophils Relative %: 73 %
Platelets: 225 10*3/uL (ref 150–400)
RBC: 5.62 MIL/uL (ref 4.22–5.81)
RDW: 13.1 % (ref 11.5–15.5)
WBC: 7.6 10*3/uL (ref 4.0–10.5)
nRBC: 0 % (ref 0.0–0.2)

## 2020-05-10 LAB — COMPREHENSIVE METABOLIC PANEL
ALT: 11 U/L (ref 0–44)
AST: 17 U/L (ref 15–41)
Albumin: 2.9 g/dL — ABNORMAL LOW (ref 3.5–5.0)
Alkaline Phosphatase: 60 U/L (ref 38–126)
Anion gap: 7 (ref 5–15)
BUN: 11 mg/dL (ref 8–23)
CO2: 25 mmol/L (ref 22–32)
Calcium: 7 mg/dL — ABNORMAL LOW (ref 8.9–10.3)
Chloride: 102 mmol/L (ref 98–111)
Creatinine, Ser: 0.83 mg/dL (ref 0.61–1.24)
GFR calc Af Amer: >60 mL/min (ref >60)
GFR calc non Af Amer: >60 mL/min (ref >60)
Glucose, Bld: 193 mg/dL — ABNORMAL HIGH (ref 70–99)
Potassium: 3.7 mmol/L (ref 3.5–5.1)
Sodium: 134 mmol/L — ABNORMAL LOW (ref 135–145)
Total Bilirubin: 1.3 mg/dL — ABNORMAL HIGH (ref 0.3–1.2)
Total Protein: 5.8 g/dL — ABNORMAL LOW (ref 6.5–8.1)

## 2020-05-10 LAB — FERRITIN: Ferritin: 163 ng/mL (ref 24–336)

## 2020-05-10 LAB — ABO/RH: ABO/RH(D): O POS

## 2020-05-10 LAB — FIBRINOGEN: Fibrinogen: 800 mg/dL — ABNORMAL HIGH (ref 210–475)

## 2020-05-10 LAB — SARS CORONAVIRUS 2 BY RT PCR (HOSPITAL ORDER, PERFORMED IN ~~LOC~~ HOSPITAL LAB): SARS Coronavirus 2: POSITIVE — AB

## 2020-05-10 LAB — D-DIMER, QUANTITATIVE: D-Dimer, Quant: 0.95 ug/mL-FEU — ABNORMAL HIGH (ref 0.00–0.50)

## 2020-05-10 MED ORDER — FAMOTIDINE IN NACL 20-0.9 MG/50ML-% IV SOLN: 20.0000 mg | Freq: Once | INTRAVENOUS | Status: DC | PRN

## 2020-05-10 MED ORDER — SODIUM CHLORIDE 0.9 % IV SOLN: INTRAVENOUS | Status: DC | PRN

## 2020-05-10 MED ORDER — EPINEPHRINE 0.3 MG/0.3ML IJ SOAJ: 0.3000 mg | Freq: Once | INTRAMUSCULAR | Status: DC | PRN

## 2020-05-10 MED ORDER — SODIUM CHLORIDE 0.9 % IV SOLN
1200.0000 mg | Freq: Once | INTRAVENOUS | Status: AC
  Administered 2020-05-10: 1200 mg via INTRAVENOUS
  Filled 2020-05-10: qty 1200

## 2020-05-10 MED ORDER — ALBUTEROL SULFATE HFA 108 (90 BASE) MCG/ACT IN AERS: 2.0000 | INHALATION_SPRAY | Freq: Once | RESPIRATORY_TRACT | Status: DC | PRN

## 2020-05-10 MED ORDER — METHYLPREDNISOLONE SODIUM SUCC 125 MG IJ SOLR: 125.0000 mg | Freq: Once | INTRAMUSCULAR | Status: DC | PRN

## 2020-05-10 MED ORDER — DIPHENHYDRAMINE HCL 50 MG/ML IJ SOLN: 50.0000 mg | Freq: Once | INTRAMUSCULAR | Status: DC | PRN

## 2020-05-10 NOTE — ED Notes (Signed)
Date and time results received: 05/10/20 12:25 PM  (use smartphrase ".now" to insert current time)  Test: SARS Co-V II Critical Value: Positive  Name of Provider Notified: Dr. Eulis Foster  Orders Received? Or Actions Taken?:

## 2020-05-10 NOTE — ED Notes (Signed)
[facility] called for transport.  

## 2020-05-10 NOTE — Discharge Instructions (Addendum)
You have a Covid infection.  We have treated you with monoclonal antibodies to improve your chance for recovery.  For fever use Tylenol every 4 hours.  For cough use Robitussin-DM.  Follow-up with your primary care doctor for checkup in a week or so.  If you get worse, you can always return here.  There is not much more treatment that can be done for Covid besides symptomatic care at this time.

## 2020-05-10 NOTE — ED Notes (Signed)
Pt soiled bed with urine. Pt's linen changed and brief applied. Urinal at bedside. Pt refusing dinner tray at this time.

## 2020-05-10 NOTE — ED Triage Notes (Signed)
EMS reports from home, called out by wife stating over last 24 hrs multiple falls, No obvious injuries, small skin tear left forearm. No LOC, no blood thinners. Also reports productive cough X several days. Wife Positive Covid x 1 week ago. Pt hx of COPD has O2 home use but non-compliant, current heavy smoker.  BP 154/100 HR 102 RR 40 Sp02 96 RA CBG 236 Temp 97.8  18ga L forearm.

## 2020-05-10 NOTE — Progress Notes (Signed)
TOC CM referral received for transportation home due to weakness, [facility] transport will be safest for pt. Spoke to pt and gave permission to speak to dtr, [place]. States he has RW in the closet at home. Instructed pt to use RW and urinal at home to help avoid falls. He agreeable to [place]. He has friend that brings food and cooks for him. Spoke to dtr via phone, pt lives at home with girlfriend. She is also positive for COVID. Her daughter is bringing them food and supplies. Pt has cane at home but does not use assistive devices. Received permission from dtr to arrange COVID remote home program. DME orders faxed to [employer] for delivery on tomorrow, 05/11/2020. [place], [place] ED TOC CM [phone]

## 2020-05-10 NOTE — ED Provider Notes (Signed)
[facility] DEPT Provider Note   CSN: [id number] Arrival date & time: 05/10/20  0854     History Chief Complaint  Patient presents with  . Fall  . Cough    Jonathan [name] is a 79 y.o. male.  HPI He presents for evaluation of "falling," per EMS.  Apparently his wife called EMS with this report.  She is at home living with the patient.  She apparently has "Covid," at this time.  Patient states he does not know why he is here.  He is unable to give any additional history.  Level 5 caveat-altered mental status    Past Medical History:  Diagnosis Date  . Allergic rhinitis   . Asthma   . COPD (chronic obstructive pulmonary disease) ([place])   . Dermatitis seborrheica   . Diabetes mellitus type II   . ED (erectile dysfunction)   . Frozen shoulder   . HLD (hyperlipidemia)   . HTN (hypertension)   . Hyperkalemia   . Kidney stone   . Other diseases of lung, not elsewhere classified   . Pulmonary nodule, right    lower lobe  . Rotator cuff injury    right  . Shoulder pain   . Stroke [facility])    pt states "last year"  . Tobacco abuse     Patient Active Problem List   Diagnosis Date Noted  . Medicare annual wellness visit, subsequent 04/09/2020  . Vision changes 04/09/2020  . Routine general medical examination at a health care facility 11/25/2016  . Localization-related symptomatic epilepsy and epileptic syndromes with complex partial seizures, not intractable, without status epilepticus ([place]) 05/06/2016  . Intracranial vascular stenosis 05/06/2016  . History of stroke 05/06/2016  . Type 2 diabetes mellitus with hyperglycemia, with long-term current use of insulin ([place]) 12/04/2015  . History of shingles 09/08/2014  . Medically noncompliant 05/05/2014  . Chronic respiratory failure ([place]) 12/15/2013  . Immunization not carried out because of patient refusal 07/05/2012  . Hyperlipidemia associated with type 2 diabetes mellitus ([place])   . Pulmonary  nodule, right   . Allergic rhinitis   . ED (erectile dysfunction)   . Tobacco abuse   . COPD (chronic obstructive pulmonary disease) ([place]) 03/29/2010  . Essential hypertension 01/05/2007    Past Surgical History:  Procedure Laterality Date  . CERVICAL DISCECTOMY  1998  . EEG    . ETT  11/1994  . TEE WITHOUT CARDIOVERSION  12/03/2011   Procedure: TRANSESOPHAGEAL ECHOCARDIOGRAM (TEE);  Surgeon: Lelon Perla, MD;  Location: [facility] ENDOSCOPY;  Service: Cardiovascular;  Laterality: N/A;  . TM repair         Family History  Problem Relation Age of Onset  . Asthma Sister   . Emphysema Sister   . Diabetes Mother   . Diabetes Brother   . Heart disease Brother     Social History   Tobacco Use  . Smoking status: Current Every Day Smoker    Packs/day: 1.00    Years: 65.00    Pack years: 65.00    Types: Cigarettes  . Smokeless tobacco: Never Used  Substance Use Topics  . Alcohol use: No    Alcohol/week: 0.0 standard drinks    Comment: Quit 40 years ago  . Drug use: No    Home Medications Prior to Admission medications   Medication Sig Start Date End Date Taking? Authorizing Provider  albuterol (PROVENTIL) (2.5 MG/3ML) 0.083% nebulizer solution Take 3 mLs (2.5 mg total) every 4 (four) hours  as needed by nebulization for shortness of breath. 08/06/17  Yes Patrecia Pour, Christean Grief, MD  albuterol (VENTOLIN HFA) 108 (90 Base) MCG/ACT inhaler Inhale 2 puffs into the lungs every 6 (six) hours as needed for wheezing. 04/09/20  Yes Tower, Wynelle Fanny, MD  aspirin 81 MG tablet Take 1 tablet (81 mg total) by mouth daily. 11/26/13  Yes Ghimire, Henreitta Leber, MD  atorvastatin (LIPITOR) 10 MG tablet Take 1 tablet (10 mg total) by mouth daily. 04/09/20  Yes Tower, Wynelle Fanny, MD  Budeson-Glycopyrrol-Formoterol (BREZTRI AEROSPHERE) 160-9-4.8 MCG/ACT AERO Inhale 2 puffs into the lungs 2 (two) times daily.   Yes [provider]  HUMALOG KWIKPEN 100 UNIT/ML KwikPen INJECT 8-14 UNITS INTO THE SKIN 3 TIMES  DAILY. Patient taking differently: Inject 5-16 Units into the skin See admin instructions. Inject 3 times a day with meals and as needed with evening snack: - 10 units with a smaller meal - 12-14 units with a regular meal - 16 units with a larger meal - 5-6 units with evening snack 09/06/19  Yes Philemon Kingdom, MD  insulin glargine (LANTUS) 100 UNIT/ML Solostar Pen Inject 42 Units into the skin at bedtime. 04/10/20  Yes Philemon Kingdom, MD  metFORMIN (GLUCOPHAGE) 1000 MG tablet TAKE ONE TABLET BY MOUTH  TWICE DAILY Patient taking differently: Take 1,000 mg by mouth 2 (two) times daily with a meal.  01/17/20  Yes Philemon Kingdom, MD  ACCU-CHEK GUIDE test strip USE AS INSTRUCTED TO TEST 4 TIMES DAILY DX:E11.65 06/27/19   Philemon Kingdom, MD  fluticasone furoate-vilanterol (BREO ELLIPTA) 100-25 MCG/INH AEPB Inhale 1 puff into the lungs daily. Patient not taking: Reported on 05/10/2020 04/09/20   Tower, Roque Lias A, MD  Insulin Pen Needle (BD PEN NEEDLE NANO U/F) 32G X 4 MM MISC Use to inject insulin 4 times a day. 10/20/18   Philemon Kingdom, MD  Lancets Misc. (ACCU-CHEK FASTCLIX LANCET) KIT 1 each by Does not apply route QID. DX: E11.65 06/07/18   Philemon Kingdom, MD    Allergies    Amoxicillin-pot clavulanate, Quinapril hcl, Valsartan, and Varenicline tartrate  Review of Systems   Review of Systems  Unable to perform ROS: Mental status change    Physical Exam Updated Vital Signs BP (!) 169/73 (BP Location: Right Arm)   Pulse 97   Temp 98.9 F (37.2 C) (Oral)   Resp (!) 25   SpO2 96%   Physical Exam Vitals and nursing note reviewed.  Constitutional:      General: He is not in acute distress.    Appearance: He is well-developed. He is ill-appearing. He is not toxic-appearing or diaphoretic.     Comments: Elderly, frail  HENT:     Head: Normocephalic and atraumatic.     Right Ear: External ear normal.     Left Ear: External ear normal.  Eyes:     Conjunctiva/sclera:  Conjunctivae normal.     Pupils: Pupils are equal, round, and reactive to light.  Neck:     Trachea: Phonation normal.  Cardiovascular:     Rate and Rhythm: Normal rate and regular rhythm.  Pulmonary:     Effort: Pulmonary effort is normal.  Chest:     Chest wall: Tenderness (Right anterior, diffuse without crepitation, deformity or overlying skin changes.) present.  Abdominal:     General: There is no distension.     Palpations: Abdomen is soft.     Tenderness: There is no abdominal tenderness.  Musculoskeletal:        General:  No swelling, tenderness or signs of injury. Normal range of motion.     Cervical back: Normal range of motion and neck supple.  Skin:    General: Skin is warm and dry.     Comments: Superficial abrasions left forehead and left lateral elbow.  No active bleeding, crepitation or deformity associated with the abrasions.  Neurological:     Mental Status: He is alert.     Cranial Nerves: No cranial nerve deficit.     Sensory: No sensory deficit.     Motor: No abnormal muscle tone.     Coordination: Coordination normal.     Comments: No dysarthria, or aphasia.  Psychiatric:        Mood and Affect: Mood normal.        Behavior: Behavior normal.     ED Results / Procedures / Treatments   Labs (all labs ordered are listed, but only abnormal results are displayed) Labs Reviewed  SARS CORONAVIRUS 2 BY RT PCR (HOSPITAL ORDER, [facility]) - Abnormal; Notable for the following components:      Result Value   SARS Coronavirus 2 POSITIVE (*)    All other components within normal limits  COMPREHENSIVE METABOLIC PANEL - Abnormal; Notable for the following components:   Sodium 134 (*)    Glucose, Bld 193 (*)    Calcium 7.0 (*)    Total Protein 5.8 (*)    Albumin 2.9 (*)    Total Bilirubin 1.3 (*)    All other components within normal limits  D-DIMER, QUANTITATIVE (NOT AT [facility]) - Abnormal; Notable for the following components:    D-Dimer, Quant 0.95 (*)    All other components within normal limits  FIBRINOGEN - Abnormal; Notable for the following components:   Fibrinogen 800 (*)    All other components within normal limits  CBC WITH DIFFERENTIAL/PLATELET  FERRITIN  PROCALCITONIN  ABO/RH    EKG EKG Interpretation  Date/Time:  Thursday May 10 2020 10:27:49 EDT Ventricular Rate:  95 PR Interval:    QRS Duration: 98 QT Interval:  346 QTC Calculation: 435 R Axis:     Text Interpretation: Sinus rhythm Anterior infarct, old Minimal ST depression, lateral leads since last tracing no significant change Confirmed by Daleen Bo [phone]) on 05/10/2020 8:01:12 PM   Radiology Portable chest 1 View  Result Date: 05/10/2020 CLINICAL DATA:  Productive cough for several days. EXAM: PORTABLE CHEST 1 VIEW COMPARISON:  12/09/2017 FINDINGS: 0956 hours. Low volumes. Cardiopericardial silhouette is at upper limits of normal for size. The lungs are clear without focal pneumonia, edema, pneumothorax or pleural effusion. The visualized bony structures of the thorax show now acute abnormality. IMPRESSION: Low volume film without acute cardiopulmonary findings. Electronically Signed   By: Misty Stanley M.D.   On: 05/10/2020 10:18    Procedures .Critical Care Performed by: Daleen Bo, MD Authorized by: Daleen Bo, MD   Critical care provider statement:    Critical care time (minutes):  75   Critical care start time:  05/10/2020 8:55 PM   Critical care end time:  05/10/2020 4:09 PM   Critical care time was exclusive of:  Separately billable procedures and treating other patients   Critical care was necessary to treat or prevent imminent or life-threatening deterioration of the following conditions:  Respiratory failure   Critical care was time spent personally by me on the following activities:  Blood draw for specimens, development of treatment plan with patient or surrogate, discussions with consultants,  evaluation of  patient's response to treatment, examination of patient, obtaining history from patient or surrogate, ordering and performing treatments and interventions, ordering and review of laboratory studies, pulse oximetry, re-evaluation of patient's condition, review of old charts and ordering and review of radiographic studies   (including critical care time)  Medications Ordered in ED Medications  0.9 %  sodium chloride infusion (has no administration in time range)  diphenhydrAMINE (BENADRYL) injection 50 mg (has no administration in time range)  famotidine (PEPCID) IVPB 20 mg premix (has no administration in time range)  methylPREDNISolone sodium succinate (SOLU-MEDROL) 125 mg/2 mL injection 125 mg (has no administration in time range)  albuterol (VENTOLIN HFA) 108 (90 Base) MCG/ACT inhaler 2 puff (has no administration in time range)  EPINEPHrine (EPI-PEN) injection 0.3 mg (has no administration in time range)  casirivimab-imdevimab (REGEN-COV) 1,200 mg in sodium chloride 0.9 % 110 mL IVPB (0 mg Intravenous Stopped 05/10/20 1444)    ED Course  I have reviewed the triage vital signs and the nursing notes.  Pertinent labs & imaging results that were available during my care of the patient were reviewed by me and considered in my medical decision making (see chart for details).  Clinical Course as of May 10 2000  Thu May 10, 2020  1056 Normal  Procalcitonin [EW]  1056 Elevated  D-dimer, quantitative (not at [facility])(!) [EW]  1056 Elevated  Fibrinogen(!) [EW]  1056 Normal  CBC with Differential/Platelet [EW]  1056 Normal  Ferritin [EW]  1056 Normal except glucose high, calcium low, total protein low, albumin low, total bilirubin high  Comprehensive metabolic panel(!) [EW]  6861 No infiltrate or edema, interpreted by me  Portable chest 1 View [EW]  1253 Abnormal, positive for COVID-19 infection.  Oxygen saturation 97% on room air.  Monoclonal antibody infusion ordered to treat with infection,  and lessen chances for decompensation.  SARS Coronavirus 2 by RT PCR (hospital order, performed in [place] hospital lab) Nasopharyngeal Nasopharyngeal Swab(!) [EW]    Clinical Course User Index [EW] Daleen Bo, MD   MDM Rules/Calculators/A&P                           Patient Vitals for the past 24 hrs:  BP Temp Temp src Pulse Resp SpO2  05/10/20 1840 (!) 169/73 98.9 F (37.2 C) Oral 97 (!) 25 96 %  05/10/20 1645 (!) 132/53 -- -- 80 15 96 %  05/10/20 1556 136/81 99.2 F (37.3 C) Oral 87 (!) 28 98 %  05/10/20 1545 136/81 -- -- 96 15 95 %  05/10/20 1500 (!) 143/49 -- -- 80 15 97 %  05/10/20 1435 (!) 149/81 -- -- 87 (!) 31 96 %  05/10/20 1302 117/67 -- -- 85 18 96 %  05/10/20 1232 (!) 127/103 -- -- 80 -- 96 %  05/10/20 1126 139/71 (!) 100.4 F (38 C) Oral 93 20 95 %  05/10/20 0906 (!) 116/55 97.7 F (36.5 C) Oral 91 20 98 %    4:08 PM Reevaluation with update and discussion. After initial assessment and treatment, an updated evaluation reveals he has remained relatively stable during treatment including monoclonal antibody infusion.  Findings discussed with the patient.Daleen Bo   Medical Decision Making:  This patient is presenting for evaluation of "falling,", without reported injury, which does require a range of treatment options, and is a complaint that involves a high risk of morbidity and mortality. The differential diagnoses include injury, illness,  occult metabolic disorder. I decided to review old records, and in summary chronically ill male with COPD, presenting with falling.  He has not had a Covid vaccine, and his wife is currently infected with Covid.  I did not require  additional historical information from anyone.  Clinical Laboratory Tests Ordered, included CBC, Metabolic panel and Covid test, Covid inflammatory markers. Review indicates positive Covid testing, with inflammatory changes consistent with Covid infection. Radiologic Tests Ordered, chest  x-ray.  I independently Visualized: Chest x-ray images, which show no infiltrate or edema.  Cardiac Monitor Tracing which shows normal sinus rhythm    Critical Interventions-clinical evaluation, laboratory testing, radiographic imaging, observation reassessment  Jonathan [name] was evaluated in Emergency Department on 05/10/2020 for the symptoms described in the history of present illness. He was evaluated in the context of the global COVID-19 pandemic, which necessitated consideration that the patient might be at risk for infection with the SARS-CoV-2 virus that causes COVID-19. Institutional protocols and algorithms that pertain to the evaluation of patients at risk for COVID-19 are in a state of rapid change based on information released by regulatory bodies including the CDC and federal and state organizations. These policies and algorithms were followed during the patient's care in the ED.   After These Interventions, the Patient was reevaluated and was found to be stable for discharge with outpatient management for acute Covid infection.  Patient's wife and daughter were contacted and did not want to take him home because they felt he could not be managed there.  Therefore social services consult will be arranged for possible placement.  Patient does not require hospitalization.  He was treated with monoclonal antibody treatment in the ED.  CRITICAL CARE-yes Performed by: Daleen Bo  Nursing Notes Reviewed/ Care Coordinated Applicable Imaging Reviewed Interpretation of Laboratory Data incorporated into ED treatment  Disposition will be in conjunction with social services.    Final Clinical Impression(s) / ED Diagnoses Final diagnoses:  Shortness of breath  COVID-19 virus infection    Rx / DC Orders ED Discharge Orders    None       Daleen Bo, MD 05/10/20 2003

## 2020-05-11 ENCOUNTER — Emergency Department (HOSPITAL_COMMUNITY): Payer: Medicare Other

## 2020-05-11 ENCOUNTER — Encounter (HOSPITAL_COMMUNITY): Payer: Self-pay

## 2020-05-11 ENCOUNTER — Inpatient Hospital Stay (HOSPITAL_COMMUNITY)
Admission: EM | Admit: 2020-05-11 | Discharge: 2020-05-14 | DRG: 177 | Disposition: A | Discharge status: Home / Self Care | Payer: Medicare Other | Attending: Internal Medicine | Admitting: Internal Medicine

## 2020-05-11 DIAGNOSIS — E1165 Type 2 diabetes mellitus with hyperglycemia: Secondary | ICD-10-CM | POA: Diagnosis present

## 2020-05-11 DIAGNOSIS — I10 Essential (primary) hypertension: Secondary | ICD-10-CM | POA: Diagnosis present

## 2020-05-11 DIAGNOSIS — U071 COVID-19: Secondary | ICD-10-CM | POA: Diagnosis present

## 2020-05-11 DIAGNOSIS — R64 Cachexia: Secondary | ICD-10-CM | POA: Diagnosis present

## 2020-05-11 DIAGNOSIS — J9601 Acute respiratory failure with hypoxia: Secondary | ICD-10-CM | POA: Diagnosis present

## 2020-05-11 DIAGNOSIS — J309 Allergic rhinitis, unspecified: Secondary | ICD-10-CM | POA: Diagnosis present

## 2020-05-11 DIAGNOSIS — J1282 Pneumonia due to coronavirus disease 2019: Secondary | ICD-10-CM | POA: Diagnosis present

## 2020-05-11 DIAGNOSIS — S0081XA Abrasion of other part of head, initial encounter: Secondary | ICD-10-CM | POA: Diagnosis present

## 2020-05-11 DIAGNOSIS — E785 Hyperlipidemia, unspecified: Secondary | ICD-10-CM | POA: Diagnosis present

## 2020-05-11 DIAGNOSIS — J440 Chronic obstructive pulmonary disease with acute lower respiratory infection: Secondary | ICD-10-CM | POA: Diagnosis present

## 2020-05-11 DIAGNOSIS — Z888 Allergy status to other drugs, medicaments and biological substances status: Secondary | ICD-10-CM

## 2020-05-11 DIAGNOSIS — Z881 Allergy status to other antibiotic agents status: Secondary | ICD-10-CM

## 2020-05-11 DIAGNOSIS — Z825 Family history of asthma and other chronic lower respiratory diseases: Secondary | ICD-10-CM | POA: Diagnosis not present

## 2020-05-11 DIAGNOSIS — Z87442 Personal history of urinary calculi: Secondary | ICD-10-CM

## 2020-05-11 DIAGNOSIS — X58XXXA Exposure to other specified factors, initial encounter: Secondary | ICD-10-CM | POA: Diagnosis present

## 2020-05-11 DIAGNOSIS — Z833 Family history of diabetes mellitus: Secondary | ICD-10-CM | POA: Diagnosis not present

## 2020-05-11 DIAGNOSIS — R296 Repeated falls: Secondary | ICD-10-CM | POA: Diagnosis present

## 2020-05-11 DIAGNOSIS — W010XXA Fall on same level from slipping, tripping and stumbling without subsequent striking against object, initial encounter: Secondary | ICD-10-CM | POA: Diagnosis not present

## 2020-05-11 DIAGNOSIS — Z794 Long term (current) use of insulin: Secondary | ICD-10-CM

## 2020-05-11 DIAGNOSIS — F17210 Nicotine dependence, cigarettes, uncomplicated: Secondary | ICD-10-CM | POA: Diagnosis present

## 2020-05-11 DIAGNOSIS — Z7982 Long term (current) use of aspirin: Secondary | ICD-10-CM | POA: Diagnosis not present

## 2020-05-11 DIAGNOSIS — Z8673 Personal history of transient ischemic attack (TIA), and cerebral infarction without residual deficits: Secondary | ICD-10-CM

## 2020-05-11 DIAGNOSIS — Z8249 Family history of ischemic heart disease and other diseases of the circulatory system: Secondary | ICD-10-CM | POA: Diagnosis not present

## 2020-05-11 DIAGNOSIS — J449 Chronic obstructive pulmonary disease, unspecified: Secondary | ICD-10-CM | POA: Diagnosis present

## 2020-05-11 DIAGNOSIS — Z6826 Body mass index (BMI) 26.0-26.9, adult: Secondary | ICD-10-CM | POA: Diagnosis not present

## 2020-05-11 DIAGNOSIS — F0390 Unspecified dementia without behavioral disturbance: Secondary | ICD-10-CM | POA: Diagnosis present

## 2020-05-11 DIAGNOSIS — R778 Other specified abnormalities of plasma proteins: Secondary | ICD-10-CM

## 2020-05-11 DIAGNOSIS — Z79899 Other long term (current) drug therapy: Secondary | ICD-10-CM

## 2020-05-11 LAB — CBC WITH DIFFERENTIAL/PLATELET
Abs Immature Granulocytes: 0.05 10*3/uL (ref 0.00–0.07)
Basophils Absolute: 0 10*3/uL (ref 0.0–0.1)
Basophils Relative: 0 %
Eosinophils Absolute: 0 10*3/uL (ref 0.0–0.5)
Eosinophils Relative: 0 %
HCT: 54.6 % — ABNORMAL HIGH (ref 39.0–52.0)
Hemoglobin: 17.9 g/dL — ABNORMAL HIGH (ref 13.0–17.0)
Immature Granulocytes: 1 %
Lymphocytes Relative: 18 %
Lymphs Abs: 1.8 10*3/uL (ref 0.7–4.0)
MCH: 29.2 pg (ref 26.0–34.0)
MCHC: 32.8 g/dL (ref 30.0–36.0)
MCV: 88.9 fL (ref 80.0–100.0)
Monocytes Absolute: 1.3 10*3/uL — ABNORMAL HIGH (ref 0.1–1.0)
Monocytes Relative: 14 %
Neutro Abs: 6.4 10*3/uL (ref 1.7–7.7)
Neutrophils Relative %: 67 %
Platelets: 247 10*3/uL (ref 150–400)
RBC: 6.14 MIL/uL — ABNORMAL HIGH (ref 4.22–5.81)
RDW: 13.2 % (ref 11.5–15.5)
WBC: 9.5 10*3/uL (ref 4.0–10.5)
nRBC: 0 % (ref 0.0–0.2)

## 2020-05-11 LAB — COMPREHENSIVE METABOLIC PANEL
ALT: 16 U/L (ref 0–44)
AST: 32 U/L (ref 15–41)
Albumin: 3.6 g/dL (ref 3.5–5.0)
Alkaline Phosphatase: 68 U/L (ref 38–126)
Anion gap: 17 — ABNORMAL HIGH (ref 5–15)
BUN: 18 mg/dL (ref 8–23)
CO2: 25 mmol/L (ref 22–32)
Calcium: 8.8 mg/dL — ABNORMAL LOW (ref 8.9–10.3)
Chloride: 93 mmol/L — ABNORMAL LOW (ref 98–111)
Creatinine, Ser: 1.1 mg/dL (ref 0.61–1.24)
GFR calc Af Amer: >60 mL/min (ref >60)
GFR calc non Af Amer: >60 mL/min (ref >60)
Glucose, Bld: 138 mg/dL — ABNORMAL HIGH (ref 70–99)
Potassium: 4.1 mmol/L (ref 3.5–5.1)
Sodium: 135 mmol/L (ref 135–145)
Total Bilirubin: 1.5 mg/dL — ABNORMAL HIGH (ref 0.3–1.2)
Total Protein: 7 g/dL (ref 6.5–8.1)

## 2020-05-11 LAB — CBG MONITORING, ED
Glucose-Capillary: 237 mg/dL — ABNORMAL HIGH (ref 70–99)
Glucose-Capillary: 169 mg/dL — ABNORMAL HIGH (ref 70–99)

## 2020-05-11 LAB — TROPONIN I (HIGH SENSITIVITY)
Troponin I (High Sensitivity): 248 ng/L (ref <18)
Troponin I (High Sensitivity): 278 ng/L (ref <18)
Troponin I (High Sensitivity): 218 ng/L (ref <18)

## 2020-05-11 LAB — URINALYSIS, ROUTINE W REFLEX MICROSCOPIC
Bilirubin Urine: NEGATIVE
Glucose, UA: 50 mg/dL — AB
Ketones, ur: 20 mg/dL — AB
Leukocytes,Ua: NEGATIVE
Nitrite: NEGATIVE
Protein, ur: 100 mg/dL — AB
Specific Gravity, Urine: 1.024 (ref 1.005–1.030)
pH: 5 (ref 5.0–8.0)

## 2020-05-11 LAB — CREATININE, SERUM
Creatinine, Ser: 1.13 mg/dL (ref 0.61–1.24)
GFR calc Af Amer: >60 mL/min (ref >60)
GFR calc non Af Amer: >60 mL/min (ref >60)

## 2020-05-11 LAB — ABO/RH: ABO/RH(D): O POS

## 2020-05-11 MED ORDER — INSULIN GLARGINE 100 UNIT/ML ~~LOC~~ SOLN
20.0000 [IU] | Freq: Every day | SUBCUTANEOUS | Status: DC
  Administered 2020-05-11 – 2020-05-13 (×3): 20 [IU] via SUBCUTANEOUS
  Filled 2020-05-11 (×3): qty 0.2

## 2020-05-11 MED ORDER — SODIUM CHLORIDE 0.9 % IV SOLN
200.0000 mg | Freq: Once | INTRAVENOUS | Status: AC
  Administered 2020-05-11: 200 mg via INTRAVENOUS
  Filled 2020-05-11: qty 40

## 2020-05-11 MED ORDER — ENOXAPARIN SODIUM 40 MG/0.4ML ~~LOC~~ SOLN
40.0000 mg | SUBCUTANEOUS | Status: DC
  Administered 2020-05-11 – 2020-05-13 (×3): 40 mg via SUBCUTANEOUS
  Filled 2020-05-11 (×3): qty 0.4

## 2020-05-11 MED ORDER — DEXAMETHASONE SODIUM PHOSPHATE 10 MG/ML IJ SOLN
10.0000 mg | Freq: Once | INTRAMUSCULAR | Status: AC
  Administered 2020-05-11: 10 mg via INTRAVENOUS
  Filled 2020-05-11: qty 1

## 2020-05-11 MED ORDER — FLUTICASONE FUROATE-VILANTEROL 100-25 MCG/INH IN AEPB
1.0000 | INHALATION_SPRAY | Freq: Every day | RESPIRATORY_TRACT | Status: DC
  Administered 2020-05-13 – 2020-05-14 (×2): 1 via RESPIRATORY_TRACT
  Filled 2020-05-11: qty 28

## 2020-05-11 MED ORDER — BUDESON-GLYCOPYRROL-FORMOTEROL 160-9-4.8 MCG/ACT IN AERO: 2.0000 | INHALATION_SPRAY | Freq: Two times a day (BID) | RESPIRATORY_TRACT | Status: DC

## 2020-05-11 MED ORDER — SODIUM CHLORIDE 0.9 % IV SOLN
100.0000 mg | Freq: Every day | INTRAVENOUS | Status: DC
  Administered 2020-05-12 – 2020-05-14 (×3): 100 mg via INTRAVENOUS
  Filled 2020-05-11 (×4): qty 20

## 2020-05-11 MED ORDER — SODIUM CHLORIDE 0.9 % IV SOLN: INTRAVENOUS | Status: AC

## 2020-05-11 MED ORDER — ALBUTEROL SULFATE HFA 108 (90 BASE) MCG/ACT IN AERS
2.0000 | INHALATION_SPRAY | Freq: Four times a day (QID) | RESPIRATORY_TRACT | Status: DC | PRN
  Administered 2020-05-13 (×2): 2 via RESPIRATORY_TRACT
  Filled 2020-05-11: qty 6.7

## 2020-05-11 MED ORDER — INSULIN ASPART 100 UNIT/ML ~~LOC~~ SOLN
6.0000 [IU] | Freq: Three times a day (TID) | SUBCUTANEOUS | Status: DC
  Administered 2020-05-11 – 2020-05-14 (×9): 6 [IU] via SUBCUTANEOUS
  Filled 2020-05-11: qty 0.06

## 2020-05-11 MED ORDER — SODIUM CHLORIDE 0.9 % IV BOLUS
1000.0000 mL | Freq: Once | INTRAVENOUS | Status: AC
  Administered 2020-05-11: 1000 mL via INTRAVENOUS

## 2020-05-11 MED ORDER — ATORVASTATIN CALCIUM 10 MG PO TABS
10.0000 mg | ORAL_TABLET | Freq: Every day | ORAL | Status: DC
  Administered 2020-05-11 – 2020-05-14 (×4): 10 mg via ORAL
  Filled 2020-05-11 (×4): qty 1

## 2020-05-11 MED ORDER — METHYLPREDNISOLONE SODIUM SUCC 40 MG IJ SOLR
0.5000 mg/kg | Freq: Two times a day (BID) | INTRAMUSCULAR | Status: DC
  Administered 2020-05-11 – 2020-05-14 (×6): 35.6 mg via INTRAVENOUS
  Filled 2020-05-11 (×6): qty 1

## 2020-05-11 MED ORDER — ASPIRIN 81 MG PO CHEW
324.0000 mg | CHEWABLE_TABLET | Freq: Once | ORAL | Status: AC
  Administered 2020-05-11: 324 mg via ORAL
  Filled 2020-05-11: qty 4

## 2020-05-11 MED ORDER — ASPIRIN 81 MG PO CHEW
81.0000 mg | CHEWABLE_TABLET | Freq: Every day | ORAL | Status: DC
  Administered 2020-05-12 – 2020-05-14 (×3): 81 mg via ORAL
  Filled 2020-05-11 (×3): qty 1

## 2020-05-11 MED ORDER — POLYETHYLENE GLYCOL 3350 17 G PO PACK: 17.0000 g | PACK | Freq: Every day | ORAL | Status: DC | PRN

## 2020-05-11 MED ORDER — ALBUTEROL SULFATE (2.5 MG/3ML) 0.083% IN NEBU: 2.5000 mg | INHALATION_SOLUTION | RESPIRATORY_TRACT | Status: DC | PRN

## 2020-05-11 MED ORDER — ONDANSETRON HCL 4 MG/2ML IJ SOLN: 4.0000 mg | Freq: Four times a day (QID) | INTRAMUSCULAR | Status: DC | PRN

## 2020-05-11 MED ORDER — INSULIN DETEMIR 100 UNIT/ML ~~LOC~~ SOLN: 20.0000 [IU] | Freq: Every day | SUBCUTANEOUS | Status: DC

## 2020-05-11 MED ORDER — ALBUTEROL SULFATE HFA 108 (90 BASE) MCG/ACT IN AERS
4.0000 | INHALATION_SPRAY | Freq: Once | RESPIRATORY_TRACT | Status: AC
  Administered 2020-05-11: 4 via RESPIRATORY_TRACT
  Filled 2020-05-11: qty 6.7

## 2020-05-11 MED ORDER — ONDANSETRON HCL 4 MG PO TABS: 4.0000 mg | ORAL_TABLET | Freq: Four times a day (QID) | ORAL | Status: DC | PRN

## 2020-05-11 MED ORDER — INSULIN ASPART 100 UNIT/ML ~~LOC~~ SOLN
0.0000 [IU] | Freq: Three times a day (TID) | SUBCUTANEOUS | Status: DC
  Administered 2020-05-11: 7 [IU] via SUBCUTANEOUS
  Administered 2020-05-12 – 2020-05-13 (×3): 4 [IU] via SUBCUTANEOUS
  Administered 2020-05-13: 3 [IU] via SUBCUTANEOUS
  Administered 2020-05-14 (×2): 7 [IU] via SUBCUTANEOUS
  Filled 2020-05-11: qty 0.2

## 2020-05-11 MED ORDER — INSULIN ASPART 100 UNIT/ML ~~LOC~~ SOLN
0.0000 [IU] | Freq: Every day | SUBCUTANEOUS | Status: DC
  Administered 2020-05-12 – 2020-05-13 (×2): 3 [IU] via SUBCUTANEOUS
  Filled 2020-05-11: qty 0.05

## 2020-05-11 NOTE — ED Notes (Signed)
Turned pt's supplement O2 off

## 2020-05-11 NOTE — ED Notes (Signed)
Pt slipped while using the urinal. Pt has had no LOC, states that he hit his head, but stood up before the RN could get into the room. Pt A&Ox4. Pt placed on bed alarm. Pt re-taught about using the call bell and a condom catheter was placed on the pt. Floor coverage was paged. Pt changed to a high fall risk.

## 2020-05-11 NOTE — ED Provider Notes (Signed)
2nd trop. transitionof care consult. Physical Exam  BP 138/69 (BP Location: Right Arm)    Pulse 96    Temp 99.3 F (37.4 C) (Oral)    Resp 20    Ht 5\' 5"  (1.651 m)    Wt 71.2 kg    SpO2 95%    BMI 26.13 kg/m   Physical Exam  ED Course/Procedures     Procedures  MDM  Patient is back to the emergency department for reassessment.  He is Covid positive.  Patient has been desaturating off of oxygen down to 88% at rest.  Patient has severe comorbid illness of COPD, diabetes hypertension and stroke.  Patient had one treatment with monoclonal antibody.  At this time with persistent desaturations at rest requiring oxygen and known Covid, plan for admission.      Charlesetta Shanks, MD 05/11/20 1335

## 2020-05-11 NOTE — ED Notes (Signed)
Date and time results received: 05/11/20 10:14 AM (use smartphrase ".now" to insert current time)  Test: troponin Critical Value: 278  Name of Provider Notified: Dr. Vallery Ridge   Orders Received? Or Actions Taken?: MD notifed

## 2020-05-11 NOTE — H&P (Addendum)
History and Physical  Jonathan [name] NOI:[id number] DOB: May 04, 1941 DOA: 05/11/2020  PCP: Abner Greenspan, MD Patient coming from: Home  I have personally briefly reviewed patient's old medical records in [place]   Chief Complaint: SOB  HPI: Jonathan [name] is a 79 y.o. male past medical history of hypertension diabetes COPD not oxygen dependent who was recently seen in the ED on 05/10/2020 for falls evaluation did not showed significant symptoms, tested positive for SARS-CoV-2 he did get Regeneron, was not hypoxic is brought in by the family for ongoing falls, in the ED was found to be hypoxic.  The patient is a very nice person which does not want to provide a history and refuses to have a conversation.  The ED: Was hypoxic satting 88 with a procalcitonin less than 0.1, UA showed specific gravity of 1024 0 white blood cells chest x-ray showed no acute cardiopulmonary disease.   Review of Systems: All systems reviewed and apart from history of presenting illness, are negative.  Past Medical History:  Diagnosis Date  . Allergic rhinitis   . Asthma   . COPD (chronic obstructive pulmonary disease) ([place])   . Dermatitis seborrheica   . Diabetes mellitus type II   . ED (erectile dysfunction)   . Frozen shoulder   . HLD (hyperlipidemia)   . HTN (hypertension)   . Hyperkalemia   . Kidney stone   . Other diseases of lung, not elsewhere classified   . Pulmonary nodule, right    lower lobe  . Rotator cuff injury    right  . Shoulder pain   . Stroke [facility])    pt states "last year"  . Tobacco abuse    Past Surgical History:  Procedure Laterality Date  . CERVICAL DISCECTOMY  1998  . EEG    . ETT  11/1994  . TEE WITHOUT CARDIOVERSION  12/03/2011   Procedure: TRANSESOPHAGEAL ECHOCARDIOGRAM (TEE);  Surgeon: Lelon Perla, MD;  Location: [facility] ENDOSCOPY;  Service: Cardiovascular;  Laterality: N/A;  . TM repair     Social History:  reports that he has been smoking cigarettes.  He has a 65.00 pack-year smoking history. He has never used smokeless tobacco. He reports that he does not drink alcohol and does not use drugs.   Allergies  Allergen Reactions  . Amoxicillin-Pot Clavulanate Other (See Comments)    Severe stomach pain.   . Quinapril Hcl Other (See Comments)    back pain  . Valsartan Other (See Comments)    back pain  . Varenicline Tartrate Other (See Comments)    AMS, hallucinations, night mares    Family History  Problem Relation Age of Onset  . Asthma Sister   . Emphysema Sister   . Diabetes Mother   . Diabetes Brother   . Heart disease Brother     Prior to Admission medications   Medication Sig Start Date End Date Taking? Authorizing Provider  ACCU-CHEK GUIDE test strip USE AS INSTRUCTED TO TEST 4 TIMES DAILY DX:E11.65 06/27/19   Philemon Kingdom, MD  albuterol (PROVENTIL) (2.5 MG/3ML) 0.083% nebulizer solution Take 3 mLs (2.5 mg total) every 4 (four) hours as needed by nebulization for shortness of breath. 08/06/17   Doreatha Lew, MD  albuterol (VENTOLIN HFA) 108 (90 Base) MCG/ACT inhaler Inhale 2 puffs into the lungs every 6 (six) hours as needed for wheezing. 04/09/20   Tower, Wynelle Fanny, MD  aspirin 81 MG tablet Take 1 tablet (81 mg total) by  mouth daily. 11/26/13   Ghimire, Henreitta Leber, MD  atorvastatin (LIPITOR) 10 MG tablet Take 1 tablet (10 mg total) by mouth daily. 04/09/20   Tower, Wynelle Fanny, MD  Budeson-Glycopyrrol-Formoterol (BREZTRI AEROSPHERE) 160-9-4.8 MCG/ACT AERO Inhale 2 puffs into the lungs 2 (two) times daily.    [provider]  fluticasone furoate-vilanterol (BREO ELLIPTA) 100-25 MCG/INH AEPB Inhale 1 puff into the lungs daily. Patient not taking: Reported on 05/10/2020 04/09/20   Tower, Wynelle Fanny, MD  HUMALOG KWIKPEN 100 UNIT/ML KwikPen INJECT 8-14 UNITS INTO THE SKIN 3 TIMES DAILY. Patient taking differently: Inject 5-16 Units into the skin See admin instructions. Inject 3 times a day with meals and as needed with  evening snack: - 10 units with a smaller meal - 12-14 units with a regular meal - 16 units with a larger meal - 5-6 units with evening snack 09/06/19   Philemon Kingdom, MD  insulin glargine (LANTUS) 100 UNIT/ML Solostar Pen Inject 42 Units into the skin at bedtime. 04/10/20   Philemon Kingdom, MD  Insulin Pen Needle (BD PEN NEEDLE NANO U/F) 32G X 4 MM MISC Use to inject insulin 4 times a day. 10/20/18   Philemon Kingdom, MD  Lancets Misc. (ACCU-CHEK FASTCLIX LANCET) KIT 1 each by Does not apply route QID. DX: E11.65 06/07/18   Philemon Kingdom, MD  metFORMIN (GLUCOPHAGE) 1000 MG tablet TAKE ONE TABLET BY MOUTH  TWICE DAILY Patient taking differently: Take 1,000 mg by mouth 2 (two) times daily with a meal.  01/17/20   Philemon Kingdom, MD   Physical Exam: Vitals:   05/11/20 1001 05/11/20 1026 05/11/20 1030 05/11/20 1232  BP: 116/84  122/89 129/80  Pulse: 81  86 78  Resp: _0 Temp:      TempSrc:      SpO2: 95% (!) 88% 94% 92%  Weight:      Height:         General exam: Moderately built and nourished patient, lying comfortably supine on the gurney in no obvious distress.  Head, eyes and ENT: Mucous members are dry  Neck: Supple. No JVD.  Lymphatics:  Respiratory system: He basically refused physical exam, moving air movement without any.  Difficulties.  Neurological: Alert awake and oriented x3 nonfocal   Labs on Admission:  Basic Metabolic Panel: Recent Labs  Lab 05/10/20 0951 05/11/20 0611  NA 134* 135  K 3.7 4.1  CL 102 93*  CO2 25 25  GLUCOSE 193* 138*  BUN 11 18  CREATININE 0.83 1.10  CALCIUM 7.0* 8.8*   Liver Function Tests: Recent Labs  Lab 05/10/20 0951 05/11/20 0611  AST 17 32  ALT 11 16  ALKPHOS 60 68  BILITOT 1.3* 1.5*  PROT 5.8* 7.0  ALBUMIN 2.9* 3.6   No results for input(s): LIPASE, AMYLASE in the last 168 hours. No results for input(s): AMMONIA in the last 168 hours. CBC: Recent Labs  Lab 05/10/20 0951 05/11/20 0611  WBC 7.6  9.5  NEUTROABS 5.6 6.4  HGB 16.8 17.9*  HCT 50.5 54.6*  MCV 89.9 88.9  PLT 225 247   Cardiac Enzymes: No results for input(s): CKTOTAL, CKMB, CKMBINDEX, TROPONINI in the last 168 hours.  BNP (last 3 results) No results for input(s): PROBNP in the last 8760 hours. CBG: No results for input(s): GLUCAP in the last 168 hours.  Radiological Exams on Admission: DG Chest Portable 1 View  Result Date: 05/11/2020 CLINICAL DATA:  79 year old male positive COVID-19.  Cough. EXAM: PORTABLE  CHEST 1 VIEW COMPARISON:  Portable chest 05/10/2020 and earlier. FINDINGS: Portable AP semi upright view at 0312 hours. Stable lung volumes and mediastinal contours. Asymmetric attenuation of left upper lobe bronchovascular markings in keeping with emphysema. No superimposed pneumothorax, pulmonary edema, pleural effusion, or confluent pulmonary opacity. No acute osseous abnormality identified. IMPRESSION: Emphysema (ICD10-J43.9).  No acute cardiopulmonary abnormality. Electronically Signed   By: Genevie Ann M.D.   On: 05/11/2020 03:32   Portable chest 1 View  Result Date: 05/10/2020 CLINICAL DATA:  Productive cough for several days. EXAM: PORTABLE CHEST 1 VIEW COMPARISON:  12/09/2017 FINDINGS: 0956 hours. Low volumes. Cardiopericardial silhouette is at upper limits of normal for size. The lungs are clear without focal pneumonia, edema, pneumothorax or pleural effusion. The visualized bony structures of the thorax show now acute abnormality. IMPRESSION: Low volume film without acute cardiopulmonary findings. Electronically Signed   By: Misty Stanley M.D.   On: 05/10/2020 10:18    EKG: Independently reviewed.  Normal sinus rhythm normal axis no T wave abnormalities intervals are preserved.  Assessment/Plan Acute respiratory failure with hypoxia due to pneumonia due to COVID-19 virus: He did not want to talk about baricitinib or other medications, conversation was not pleasant, he was hostile and belligerent. Admit him  to MedSurg, start him on IV fluid hydration as he is significantly dry.  Chest x-ray did not show infiltrates it will probably showed after IV hydration. He did get Regeneron despite this he is hypoxic in the ED and was placed on 2 L of oxygen and oxygenation is improved. We will start him on IV steroids and Remdesivir. Strict I's and O's and daily weights, out of bed to chair, when in bed prone for at least 16 hours a day, use incentive spirometry and flutter valve.  Essential hypertension: Noted continue current home medications.  COPD (chronic obstructive pulmonary disease) (HCC) Appears at baseline no wheezing on physical exam.  Type 2 diabetes mellitus with hyperglycemia, with long-term current use of insulin (HCC) Discontinue 70/30 and metformin start on a long-acting insulin plus sliding scale. His blood glucose will be erratic we will check CBGs before meals and at bedtime   DVT Prophylaxis: lovenox Code Status: full  Family Communication: none  Disposition Plan: inaptient      It is my clinical opinion that admission to INPATIENT is reasonable and necessary in this 79 y.o. male COPD hypertension and diabetes mellitus who comes in with hypoxia due to COVID-19 syndrome dyspnea and steroids.  Given the aforementioned, the predictability of an adverse outcome is felt to be significant. I expect that the patient will require at least 2 midnights in the hospital to treat this condition.  Charlynne Cousins MD [facility]   05/11/2020, 1:45 PM

## 2020-05-11 NOTE — Social Work (Signed)
TOC CSW spoke with Jonathan [name]/girlfriend of pt, [phone].  Jonathan stated she had just been released from [facility] was at home.  She also stated pt could come back there.  Jonathan stated she could not care for herself and pt because she too has COVID.  CSW informed her that [facility] was arranged to come to the home to care for pt.  She stated, "that's good, but he fired the last [place] person."   CSW actively listened to Jonathan speak about the difficulty of caring for herself and pt.  CSW reassured her HH has been arranged and someone would be out to assist.  CSW inquired about someone having the ability to pick up pt and she stated no they are being quarantined.  CSW stated she would arrange for transportation.  Jonathan thanked CSW.  CSW will continue to follow for dc needs for transportation home.  Jonathan [name], MSW, [place] ED Transitions of CareClinical Social Worker [email or web address] [phone]

## 2020-05-11 NOTE — ED Notes (Signed)
Spoke with Sales executive who states that she spoke with patient's significant other who agreed to let patient come back home. Home health services have been setup for patient at home. When asked if patient could be transported home by [facility], this RN informed SW that patient is on Oxygen 2L via nasal canula. Since do not see that is in patient's home medication list. Will turn off oxygen to see how patient's O2 saturation level maintains before transporting home off oxygen.

## 2020-05-11 NOTE — ED Triage Notes (Signed)
Pt discharged from [facility] ED after treatment for a fall . Pt was discharge to home with original address [address] [name]. Which was the wrong address and the correct address [place] where the pt significant other states she can not take care of him because she is sick with COVID as well and sent the pt back to the hospital.

## 2020-05-11 NOTE — ED Provider Notes (Signed)
[facility] DEPT Provider Note   CSN: [id number] Arrival date & time: 05/11/20  0128     History Chief Complaint  Patient presents with  . [name]    Jonathan [name] is a 79 y.o. male.  Patient arrives by [facility].  He was discharged from the ED earlier this evening.  They attempted to take him home where patient's exwife Jonathan [name] told him that they have the wrong address.  She gave him the correct address of [address].  He apparently lives with a woman named Jonathan [name] who is his girlfriend.  EMS reports this woman of refused to accept patient because she could not care for him because she is sick herself.  They brought him back to the ED.  Patient denies any complaints.  He does not know why he is here.  He believes he is at home.  He states he is breathing at baseline.  He denies any head, neck, back, chest or abdominal pain.  Is oriented to person only.  Past medical history includes COPD, Covid positive, diabetes, hypertension, previous stroke.  Patient had a monoclonal antibody infusion while he was in the ED but did not meet inpatient criteria.  He was seen by case manager who apparently arrange for home health.  The history is provided by the patient, the EMS personnel and a friend. The history is limited by the condition of the patient.       Past Medical History:  Diagnosis Date  . Allergic rhinitis   . Asthma   . COPD (chronic obstructive pulmonary disease) ([place])   . Dermatitis seborrheica   . Diabetes mellitus type II   . ED (erectile dysfunction)   . Frozen shoulder   . HLD (hyperlipidemia)   . HTN (hypertension)   . Hyperkalemia   . Kidney stone   . Other diseases of lung, not elsewhere classified   . Pulmonary nodule, right    lower lobe  . Rotator cuff injury    right  . Shoulder pain   . Stroke [facility])    pt states "last year"  . Tobacco abuse     Patient Active Problem List   Diagnosis Date Noted  . Medicare annual  wellness visit, subsequent 04/09/2020  . Vision changes 04/09/2020  . Routine general medical examination at a health care facility 11/25/2016  . Localization-related symptomatic epilepsy and epileptic syndromes with complex partial seizures, not intractable, without status epilepticus ([place]) 05/06/2016  . Intracranial vascular stenosis 05/06/2016  . History of stroke 05/06/2016  . Type 2 diabetes mellitus with hyperglycemia, with long-term current use of insulin ([place]) 12/04/2015  . History of shingles 09/08/2014  . Medically noncompliant 05/05/2014  . Chronic respiratory failure ([place]) 12/15/2013  . Immunization not carried out because of patient refusal 07/05/2012  . Hyperlipidemia associated with type 2 diabetes mellitus ([place])   . Pulmonary nodule, right   . Allergic rhinitis   . ED (erectile dysfunction)   . Tobacco abuse   . COPD (chronic obstructive pulmonary disease) ([place]) 03/29/2010  . Essential hypertension 01/05/2007    Past Surgical History:  Procedure Laterality Date  . CERVICAL DISCECTOMY  1998  . EEG    . ETT  11/1994  . TEE WITHOUT CARDIOVERSION  12/03/2011   Procedure: TRANSESOPHAGEAL ECHOCARDIOGRAM (TEE);  Surgeon: Lelon Perla, MD;  Location: [facility] ENDOSCOPY;  Service: Cardiovascular;  Laterality: N/A;  . TM repair         Family History  Problem Relation  Age of Onset  . Asthma Sister   . Emphysema Sister   . Diabetes Mother   . Diabetes Brother   . Heart disease Brother     Social History   Tobacco Use  . Smoking status: Current Every Day Smoker    Packs/day: 1.00    Years: 65.00    Pack years: 65.00    Types: Cigarettes  . Smokeless tobacco: Never Used  Substance Use Topics  . Alcohol use: No    Alcohol/week: 0.0 standard drinks    Comment: Quit 40 years ago  . Drug use: No    Home Medications Prior to Admission medications   Medication Sig Start Date End Date Taking? Authorizing Provider  ACCU-CHEK GUIDE test strip USE AS INSTRUCTED TO TEST  4 TIMES DAILY DX:E11.65 06/27/19   Philemon Kingdom, MD  albuterol (PROVENTIL) (2.5 MG/3ML) 0.083% nebulizer solution Take 3 mLs (2.5 mg total) every 4 (four) hours as needed by nebulization for shortness of breath. 08/06/17   Doreatha Lew, MD  albuterol (VENTOLIN HFA) 108 (90 Base) MCG/ACT inhaler Inhale 2 puffs into the lungs every 6 (six) hours as needed for wheezing. 04/09/20   Tower, Wynelle Fanny, MD  aspirin 81 MG tablet Take 1 tablet (81 mg total) by mouth daily. 11/26/13   Ghimire, Henreitta Leber, MD  atorvastatin (LIPITOR) 10 MG tablet Take 1 tablet (10 mg total) by mouth daily. 04/09/20   Tower, Wynelle Fanny, MD  Budeson-Glycopyrrol-Formoterol (BREZTRI AEROSPHERE) 160-9-4.8 MCG/ACT AERO Inhale 2 puffs into the lungs 2 (two) times daily.    [provider]  fluticasone furoate-vilanterol (BREO ELLIPTA) 100-25 MCG/INH AEPB Inhale 1 puff into the lungs daily. Patient not taking: Reported on 05/10/2020 04/09/20   Tower, Wynelle Fanny, MD  HUMALOG KWIKPEN 100 UNIT/ML KwikPen INJECT 8-14 UNITS INTO THE SKIN 3 TIMES DAILY. Patient taking differently: Inject 5-16 Units into the skin See admin instructions. Inject 3 times a day with meals and as needed with evening snack: - 10 units with a smaller meal - 12-14 units with a regular meal - 16 units with a larger meal - 5-6 units with evening snack 09/06/19   Philemon Kingdom, MD  insulin glargine (LANTUS) 100 UNIT/ML Solostar Pen Inject 42 Units into the skin at bedtime. 04/10/20   Philemon Kingdom, MD  Insulin Pen Needle (BD PEN NEEDLE NANO U/F) 32G X 4 MM MISC Use to inject insulin 4 times a day. 10/20/18   Philemon Kingdom, MD  Lancets Misc. (ACCU-CHEK FASTCLIX LANCET) KIT 1 each by Does not apply route QID. DX: E11.65 06/07/18   Philemon Kingdom, MD  metFORMIN (GLUCOPHAGE) 1000 MG tablet TAKE ONE TABLET BY MOUTH  TWICE DAILY Patient taking differently: Take 1,000 mg by mouth 2 (two) times daily with a meal.  01/17/20   Philemon Kingdom, MD    Allergies     Amoxicillin-pot clavulanate, Quinapril hcl, Valsartan, and Varenicline tartrate  Review of Systems   Review of Systems  Unable to perform ROS: Dementia    Physical Exam Updated Vital Signs BP (!) 122/59 (BP Location: Left Arm)   Pulse 94   Temp (!) 101.2 F (38.4 C) (Oral)   Resp 17   SpO2 100%   Physical Exam Constitutional:      Appearance: He is ill-appearing.     Comments: Cachectic, chronically ill-appearing  Mildly increased work of breathing  HENT:     Head: Normocephalic and atraumatic.     Mouth/Throat:     Mouth: Mucous membranes are  dry.  Eyes:     Extraocular Movements: Extraocular movements intact.     Pupils: Pupils are equal, round, and reactive to light.  Cardiovascular:     Rate and Rhythm: Normal rate and regular rhythm.     Pulses: Normal pulses.     Heart sounds: No murmur heard.   Pulmonary:     Breath sounds: Wheezing present.  Abdominal:     Tenderness: There is no abdominal tenderness.  Musculoskeletal:        General: Normal range of motion.     Cervical back: Normal range of motion and neck supple.  Skin:    Capillary Refill: Capillary refill takes less than 2 seconds.     Comments: Abrasion left forehead  Scattered ecchymosis to extremities bilaterally without bony tenderness  Neurological:     Mental Status: He is alert.     Comments: Oriented to person only.  Moves all extremities, cranial nerves II to XII intact Follows commands.     ED Results / Procedures / Treatments   Labs (all labs ordered are listed, but only abnormal results are displayed) Labs Reviewed  CBC WITH DIFFERENTIAL/PLATELET - Abnormal; Notable for the following components:      Result Value   RBC 6.14 (*)    Hemoglobin 17.9 (*)    HCT 54.6 (*)    Monocytes Absolute 1.3 (*)    All other components within normal limits  COMPREHENSIVE METABOLIC PANEL - Abnormal; Notable for the following components:   Chloride 93 (*)    Glucose, Bld 138 (*)    Calcium  8.8 (*)    Total Bilirubin 1.5 (*)    Anion gap 17 (*)    All other components within normal limits  TROPONIN I (HIGH SENSITIVITY) - Abnormal; Notable for the following components:   Troponin I (High Sensitivity) 248 (*)    All other components within normal limits  CULTURE, BLOOD (ROUTINE X 2)  CULTURE, BLOOD (ROUTINE X 2)  URINALYSIS, ROUTINE W REFLEX MICROSCOPIC    EKG EKG Interpretation  Date/Time:  Friday May 11 2020 05:59:05 EDT Ventricular Rate:  95 PR Interval:    QRS Duration: 87 QT Interval:  331 QTC Calculation: 416 R Axis:   37 Text Interpretation: Sinus rhythm Anterior infarct, old No significant change was found Confirmed by Ezequiel Essex [phone]) on 05/11/2020 8:09:32 AM   Radiology Portable chest 1 View  Result Date: 05/10/2020 CLINICAL DATA:  Productive cough for several days. EXAM: PORTABLE CHEST 1 VIEW COMPARISON:  12/09/2017 FINDINGS: 0956 hours. Low volumes. Cardiopericardial silhouette is at upper limits of normal for size. The lungs are clear without focal pneumonia, edema, pneumothorax or pleural effusion. The visualized bony structures of the thorax show now acute abnormality. IMPRESSION: Low volume film without acute cardiopulmonary findings. Electronically Signed   By: Misty Stanley M.D.   On: 05/10/2020 10:18    Procedures Procedures (including critical care time)  Medications Ordered in ED Medications - No data to display  ED Course  I have reviewed the triage vital signs and the nursing notes.  Pertinent labs & imaging results that were available during my care of the patient were reviewed by me and considered in my medical decision making (see chart for details).    MDM Rules/Calculators/A&P                         Patient Covid positive from home with generalized weakness and inability to care for him  at home.  He denies any complaints.  He is febrile on arrival but not hypoxic.  Labs from earlier today reviewed that showed he is Covid  positive.  D-dimer was 0.95, fibrinogen was 800.  Low volume chest x-ray. He received monoclonal antibody fusion in the ED yesterday.  Patient's ex-wife Jonathan [name] give a phone number for Jonathan [name] with whom the patient lives.  [phone].  There is no answer at this number tonight.  Patient denies any complaints.  He is mildly tachypneic on 2 L nasal cannula.  He states he does wear oxygen at home occasionally.  He has wheezing throughout.  He is given albuterol and Decadron.  Chest x-ray does not show any pneumonia.  EKG is nonischemic.  He denies any chest pain.  Troponin is positive at 248.  He is given aspirin.  Second troponin will be pending.  Family has not called back. Transition of care consult is placed.  Dr. Johnney Killian to assume care at shift change pending second troponin.  May need CT angiogram to rule out pulmonary embolism if this continues to to climb and he continues to have shortness of breath.   Jonathan [name] was evaluated in Emergency Department on 05/11/2020 for the symptoms described in the history of present illness. He was evaluated in the context of the global COVID-19 pandemic, which necessitated consideration that the patient might be at risk for infection with the SARS-CoV-2 virus that causes COVID-19. Institutional protocols and algorithms that pertain to the evaluation of patients at risk for COVID-19 are in a state of rapid change based on information released by regulatory bodies including the CDC and federal and state organizations. These policies and algorithms were followed during the patient's care in the ED.  Final Clinical Impression(s) / ED Diagnoses Final diagnoses:  None    Rx / DC Orders ED Discharge Orders    None       Chloeanne Poteet, Annie Main, MD 05/11/20 [phone]

## 2020-05-11 NOTE — ED Notes (Signed)
Monitoring patient pulse ox saturation on room air. Pt consistently ranging from 88-90% on the room air. O2 2L via nasal canula placed back on patient. O2 came up to 94%.  Made [place] EDP aware. After reviewing patient lab work, will be consulted for admission.  Made [place] Social Worker aware.

## 2020-05-12 LAB — COMPREHENSIVE METABOLIC PANEL
ALT: 12 U/L (ref 0–44)
AST: 25 U/L (ref 15–41)
Albumin: 2.4 g/dL — ABNORMAL LOW (ref 3.5–5.0)
Alkaline Phosphatase: 48 U/L (ref 38–126)
Anion gap: 10 (ref 5–15)
BUN: 24 mg/dL — ABNORMAL HIGH (ref 8–23)
CO2: 23 mmol/L (ref 22–32)
Calcium: 6.5 mg/dL — ABNORMAL LOW (ref 8.9–10.3)
Chloride: 106 mmol/L (ref 98–111)
Creatinine, Ser: 0.76 mg/dL (ref 0.61–1.24)
GFR calc Af Amer: >60 mL/min (ref >60)
GFR calc non Af Amer: >60 mL/min (ref >60)
Glucose, Bld: 177 mg/dL — ABNORMAL HIGH (ref 70–99)
Potassium: 3.6 mmol/L (ref 3.5–5.1)
Sodium: 139 mmol/L (ref 135–145)
Total Bilirubin: 1 mg/dL (ref 0.3–1.2)
Total Protein: 5.2 g/dL — ABNORMAL LOW (ref 6.5–8.1)

## 2020-05-12 LAB — CBC WITH DIFFERENTIAL/PLATELET
Abs Immature Granulocytes: 0.05 10*3/uL (ref 0.00–0.07)
Basophils Absolute: 0 10*3/uL (ref 0.0–0.1)
Basophils Relative: 0 %
Eosinophils Absolute: 0 10*3/uL (ref 0.0–0.5)
Eosinophils Relative: 0 %
HCT: 43.8 % (ref 39.0–52.0)
Hemoglobin: 14.6 g/dL (ref 13.0–17.0)
Immature Granulocytes: 1 %
Lymphocytes Relative: 13 %
Lymphs Abs: 1.4 10*3/uL (ref 0.7–4.0)
MCH: 29.8 pg (ref 26.0–34.0)
MCHC: 33.3 g/dL (ref 30.0–36.0)
MCV: 89.4 fL (ref 80.0–100.0)
Monocytes Absolute: 0.4 10*3/uL (ref 0.1–1.0)
Monocytes Relative: 4 %
Neutro Abs: 8.6 10*3/uL — ABNORMAL HIGH (ref 1.7–7.7)
Neutrophils Relative %: 82 %
Platelets: 205 10*3/uL (ref 150–400)
RBC: 4.9 MIL/uL (ref 4.22–5.81)
RDW: 12.9 % (ref 11.5–15.5)
WBC: 10.5 10*3/uL (ref 4.0–10.5)
nRBC: 0 % (ref 0.0–0.2)

## 2020-05-12 LAB — C-REACTIVE PROTEIN: CRP: 12.7 mg/dL — ABNORMAL HIGH (ref <1.0)

## 2020-05-12 LAB — CBG MONITORING, ED
Glucose-Capillary: 197 mg/dL — ABNORMAL HIGH (ref 70–99)
Glucose-Capillary: 159 mg/dL — ABNORMAL HIGH (ref 70–99)
Glucose-Capillary: 114 mg/dL — ABNORMAL HIGH (ref 70–99)
Glucose-Capillary: 253 mg/dL — ABNORMAL HIGH (ref 70–99)

## 2020-05-12 LAB — D-DIMER, QUANTITATIVE: D-Dimer, Quant: 1.09 ug/mL-FEU — ABNORMAL HIGH (ref 0.00–0.50)

## 2020-05-12 NOTE — ED Notes (Signed)
He is at times confused, but as we speak with him, this somewhat improves. He repeatedly removes his O2, not recalling doing so. I frequently re-orient him and re-apply his o2.

## 2020-05-12 NOTE — Progress Notes (Signed)
PROGRESS NOTE    Jonathan [name]  ZSW:[id number] DOB: 25-Apr-1941 DOA: 05/11/2020 PCP: Abner Greenspan, MD   Brief Narrative:   Jonathan [name] is a 79 y.o. male past medical history of hypertension diabetes COPD not oxygen dependent who was recently seen in the ED on 05/10/2020 for falls evaluation did not showed significant symptoms, tested positive for SARS-CoV-2 he did get Regeneron, was not hypoxic is brought in by the family for ongoing falls, in the ED was found to be hypoxic.  The patient is a very nice person which does not want to provide a history and refuses to have a conversation. Was hypoxic satting 43 with a procalcitonin less than 0.1, UA showed specific gravity of 1024 0 white blood cells chest x-ray showed no acute cardiopulmonary disease.   Assessment & Plan:   Active Problems:   Essential hypertension   COPD (chronic obstructive pulmonary disease) (HCC)   Acute respiratory failure with hypoxia (HCC)   Type 2 diabetes mellitus with hyperglycemia, with long-term current use of insulin (HCC)   Pneumonia due to COVID-19 virus   Acute respiratory failure with hypoxia due to pneumonia due to COVID-19 virus: Chest x-ray without overt findings, no notable infiltrate or opacifications Status post Regeneron in the outpatient setting Continue Remdesivir 05/11/2020 through 05/15/2020  Continue IV steroids through 05/20/2020  Strict I's and O's and daily weights, out of bed to chair, when in bed prone for at least 16 hours a day, use incentive spirometry and flutter valve.  Continue to wean oxygen as tolerated, daily oxygen ambulatory screenings to evaluate for dyspnea with exertion and hypoxia with exertion. SpO2: (!) 89 % O2 Flow Rate (L/min): 2 L/min  Recent Labs    05/10/20 0951 05/12/20 0437 05/12/20 0438  DDIMER 0.95* 1.09*  --   FERRITIN 163  --   --   CRP  --   --  12.7*   Lab Results  Component Value Date   SARSCOV2NAA POSITIVE (A) 05/10/2020   Ambulatory  dysfunction  -Patient had what appears to be mechanical slip and fall overnight, no lingering symptoms -PT OT to evaluate to ensure safe ambulation, continue fall risk protocol, close monitoring/bed alarm  Essential hypertension: No home medications noted on med rec, will verify with pharmacy  Minimally elevated troponin, likely supply demand mismatch in the setting of hypoxia as above EKG shows normal sinus rhythm without any ST elevations or depressions -continue to follow clinically Currently no indication to involve cardiology given patient is asymptomatic without EKG changes Telemetry ongoing  COPD (chronic obstructive pulmonary disease) ([place]), not in acute exacerbation Patient appears to be without acute exacerbation, no wheeze/sputum production noted prior to admission  Markedly uncontrolled type 2 diabetes mellitus with hyperglycemia, with long-term current use of insulin ([place]) Patient's home medications include 70/30 and metformin -held at admission Continue long-acting insulin plus sliding scale. Continue to monitor glucose, hypoglycemic protocol ongoing Lab Results  Component Value Date   HGBA1C 12.4 (A) 04/10/2020    DVT Prophylaxis: lovenox Code Status: full  Family Communication: Patient to update  Status is: Inpatient  Dispo: The patient is from: Home              Anticipated d/c is to: To be determined              Anticipated d/c date is: 48 to 72 hours pending clinical course              Patient currently not medically stable  for discharge in the setting of acute severe COVID-19 pneumonia with ongoing symptoms, hypoxia need for IV fluids, IV steroids, IV antivirals as well as IV immunomodulators; as well as close monitoring in the setting of ambulatory dysfunction and hypoxia.  Consultants:   None  Procedures:   None indicated  Antimicrobials:  Remdesivir  Subjective: No acute issues or events overnight other than mechanical slip and fall,  respiratory status seems to be the same, review of systems otherwise somewhat difficult to obtain given patient's poor interaction with exam and interview.  Objective: Vitals:   05/12/20 0500 05/12/20 0531 05/12/20 0600 05/12/20 0630  BP: (!) 129/59 112/82 110/88 124/69  Pulse: 62 (!) 59 66 67  Resp: 18 19 20 19   Temp:      TempSrc:      SpO2: 91% 98% 95% 96%  Weight:      Height:        Intake/Output Summary (Last 24 hours) at 05/12/2020 0814 Last data filed at 05/11/2020 1550 Gross per 24 hour  Intake 1290 ml  Output --  Net 1290 ml   Filed Weights   05/11/20 0544  Weight: 71.2 kg    Examination:  General:  Pleasantly resting in bed, No acute distress. HEENT:  Normocephalic atraumatic.  Sclerae nonicteric, noninjected.  Extraocular movements intact bilaterally. Neck:  Without mass or deformity.  Trachea is midline. Lungs:  Clear to auscultate bilaterally without rhonchi, wheeze, or rales. Heart:  Regular rate and rhythm.  Without murmurs, rubs, or gallops. Abdomen:  Soft, nontender, nondistended.  Without guarding or rebound. Extremities: Without cyanosis, clubbing, edema, or obvious deformity. Vascular:  Dorsalis pedis and posterior tibial pulses palpable bilaterally. Skin:  Warm and dry, no erythema, no ulcerations.    Data Reviewed: I have personally reviewed following labs and imaging studies  CBC: Recent Labs  Lab 05/10/20 0951 05/11/20 0611 05/12/20 0437  WBC 7.6 9.5 10.5  NEUTROABS 5.6 6.4 8.6*  HGB 16.8 17.9* 14.6  HCT 50.5 54.6* 43.8  MCV 89.9 88.9 89.4  PLT 225 247 128   Basic Metabolic Panel: Recent Labs  Lab 05/10/20 0951 05/11/20 0611 05/11/20 1451 05/12/20 0437  NA 134* 135  --  139  K 3.7 4.1  --  3.6  CL 102 93*  --  106  CO2 25 25  --  23  GLUCOSE 193* 138*  --  177*  BUN 11 18  --  24*  CREATININE 0.83 1.10 1.13 0.76  CALCIUM 7.0* 8.8*  --  6.5*   GFR: Estimated Creatinine Clearance: 65.1 mL/min (by C-G formula based on SCr of  0.76 mg/dL). Liver Function Tests: Recent Labs  Lab 05/10/20 0951 05/11/20 0611 05/12/20 0437  AST 17 32 25  ALT 11 16 12   ALKPHOS 60 68 48  BILITOT 1.3* 1.5* 1.0  PROT 5.8* 7.0 5.2*  ALBUMIN 2.9* 3.6 2.4*   No results for input(s): LIPASE, AMYLASE in the last 168 hours. No results for input(s): AMMONIA in the last 168 hours. Coagulation Profile: No results for input(s): INR, PROTIME in the last 168 hours. Cardiac Enzymes: No results for input(s): CKTOTAL, CKMB, CKMBINDEX, TROPONINI in the last 168 hours. BNP (last 3 results) No results for input(s): PROBNP in the last 8760 hours. HbA1C: No results for input(s): HGBA1C in the last 72 hours. CBG: Recent Labs  Lab 05/11/20 1658 05/11/20 2200 05/12/20 0752  GLUCAP 237* 169* 197*   Lipid Profile: No results for input(s): CHOL, HDL, LDLCALC, TRIG, CHOLHDL, LDLDIRECT in  the last 72 hours. Thyroid Function Tests: No results for input(s): TSH, T4TOTAL, FREET4, T3FREE, THYROIDAB in the last 72 hours. Anemia Panel: Recent Labs    05/10/20 0951  FERRITIN 163   Sepsis Labs: Recent Labs  Lab 05/10/20 0951  PROCALCITON <0.10    Recent Results (from the past 240 hour(s))  SARS Coronavirus 2 by RT PCR (hospital order, performed in [facility] hospital lab) Nasopharyngeal Nasopharyngeal Swab     Status: Abnormal   Collection Time: 05/10/20  9:53 AM   Specimen: Nasopharyngeal Swab  Result Value Ref Range Status   SARS Coronavirus 2 POSITIVE (A) NEGATIVE Final    Comment: RESULT CALLED TO, READ BACK BY AND VERIFIED WITH: T.SMITH RN AT 4163 ON 05/10/20 BY S.VANHOORNE Performed at [facility], [place] [address] [place], [place] [address]          Radiology Studies: DG Chest Portable 1 View  Result Date: 05/11/2020 CLINICAL DATA:  79 year old male positive COVID-19.  Cough. EXAM: PORTABLE CHEST 1 VIEW COMPARISON:  Portable chest 05/10/2020 and earlier. FINDINGS: Portable AP semi upright view at 0312  hours. Stable lung volumes and mediastinal contours. Asymmetric attenuation of left upper lobe bronchovascular markings in keeping with emphysema. No superimposed pneumothorax, pulmonary edema, pleural effusion, or confluent pulmonary opacity. No acute osseous abnormality identified. IMPRESSION: Emphysema (ICD10-J43.9).  No acute cardiopulmonary abnormality. Electronically Signed   By: Genevie Ann M.D.   On: 05/11/2020 03:32   Portable chest 1 View  Result Date: 05/10/2020 CLINICAL DATA:  Productive cough for several days. EXAM: PORTABLE CHEST 1 VIEW COMPARISON:  12/09/2017 FINDINGS: 0956 hours. Low volumes. Cardiopericardial silhouette is at upper limits of normal for size. The lungs are clear without focal pneumonia, edema, pneumothorax or pleural effusion. The visualized bony structures of the thorax show now acute abnormality. IMPRESSION: Low volume film without acute cardiopulmonary findings. Electronically Signed   By: Misty Stanley M.D.   On: 05/10/2020 10:18        Scheduled Meds: . aspirin  81 mg Oral Daily  . atorvastatin  10 mg Oral Daily  . enoxaparin (LOVENOX) injection  40 mg Subcutaneous Q24H  . fluticasone furoate-vilanterol  1 puff Inhalation Daily  . insulin aspart  0-20 Units Subcutaneous TID WC  . insulin aspart  0-5 Units Subcutaneous QHS  . insulin aspart  6 Units Subcutaneous TID WC  . insulin glargine  20 Units Subcutaneous Daily  . methylPREDNISolone (SOLU-MEDROL) injection  0.5 mg/kg Intravenous Q12H   Continuous Infusions: . sodium chloride Stopped (05/12/20 0202)  . remdesivir 100 mg in NS 100 mL       LOS: 1 day   Time spent: 70min  Ryne Mctigue C Yitzchok Carriger, DO [facility]  If 7PM-7AM, please contact night-coverage [email or web address]  05/12/2020, 8:14 AM

## 2020-05-13 LAB — COMPREHENSIVE METABOLIC PANEL
ALT: 16 U/L (ref 0–44)
AST: 27 U/L (ref 15–41)
Albumin: 3.1 g/dL — ABNORMAL LOW (ref 3.5–5.0)
Alkaline Phosphatase: 62 U/L (ref 38–126)
Anion gap: 13 (ref 5–15)
BUN: 24 mg/dL — ABNORMAL HIGH (ref 8–23)
CO2: 26 mmol/L (ref 22–32)
Calcium: 8.3 mg/dL — ABNORMAL LOW (ref 8.9–10.3)
Chloride: 100 mmol/L (ref 98–111)
Creatinine, Ser: 0.83 mg/dL (ref 0.61–1.24)
GFR calc Af Amer: >60 mL/min (ref >60)
GFR calc non Af Amer: >60 mL/min (ref >60)
Glucose, Bld: 121 mg/dL — ABNORMAL HIGH (ref 70–99)
Potassium: 4.1 mmol/L (ref 3.5–5.1)
Sodium: 139 mmol/L (ref 135–145)
Total Bilirubin: 0.9 mg/dL (ref 0.3–1.2)
Total Protein: 6.3 g/dL — ABNORMAL LOW (ref 6.5–8.1)

## 2020-05-13 LAB — CBC WITH DIFFERENTIAL/PLATELET
Abs Immature Granulocytes: 0.11 10*3/uL — ABNORMAL HIGH (ref 0.00–0.07)
Basophils Absolute: 0 10*3/uL (ref 0.0–0.1)
Basophils Relative: 0 %
Eosinophils Absolute: 0 10*3/uL (ref 0.0–0.5)
Eosinophils Relative: 0 %
HCT: 51.5 % (ref 39.0–52.0)
Hemoglobin: 17.1 g/dL — ABNORMAL HIGH (ref 13.0–17.0)
Immature Granulocytes: 1 %
Lymphocytes Relative: 11 %
Lymphs Abs: 1.7 10*3/uL (ref 0.7–4.0)
MCH: 29.2 pg (ref 26.0–34.0)
MCHC: 33.2 g/dL (ref 30.0–36.0)
MCV: 88 fL (ref 80.0–100.0)
Monocytes Absolute: 0.8 10*3/uL (ref 0.1–1.0)
Monocytes Relative: 5 %
Neutro Abs: 12.7 10*3/uL — ABNORMAL HIGH (ref 1.7–7.7)
Neutrophils Relative %: 83 %
Platelets: 254 10*3/uL (ref 150–400)
RBC: 5.85 MIL/uL — ABNORMAL HIGH (ref 4.22–5.81)
RDW: 12.9 % (ref 11.5–15.5)
WBC: 15.3 10*3/uL — ABNORMAL HIGH (ref 4.0–10.5)
nRBC: 0 % (ref 0.0–0.2)

## 2020-05-13 LAB — CBG MONITORING, ED
Glucose-Capillary: 126 mg/dL — ABNORMAL HIGH (ref 70–99)
Glucose-Capillary: 98 mg/dL (ref 70–99)
Glucose-Capillary: 181 mg/dL — ABNORMAL HIGH (ref 70–99)
Glucose-Capillary: 293 mg/dL — ABNORMAL HIGH (ref 70–99)

## 2020-05-13 LAB — D-DIMER, QUANTITATIVE: D-Dimer, Quant: 0.5 ug/mL-FEU (ref 0.00–0.50)

## 2020-05-13 LAB — C-REACTIVE PROTEIN: CRP: 9.1 mg/dL — ABNORMAL HIGH (ref <1.0)

## 2020-05-13 NOTE — ED Notes (Signed)
Patient has been placed on a hospital bed at this time for comfort.

## 2020-05-13 NOTE — ED Notes (Signed)
Pt 02 was at 92% while walking in the room.

## 2020-05-13 NOTE — ED Notes (Signed)
SPO2 while ambulating: 92%. He remains in no distress.

## 2020-05-13 NOTE — ED Notes (Signed)
Assumed care of patient at this time, nad noted, sr up x2, bed locked and low, call bell w/I reach.  Will continue to monitor. ° °

## 2020-05-13 NOTE — Progress Notes (Signed)
PROGRESS NOTE    Jonathan [name]  ASN:[id number] DOB: 18-Dec-1940 DOA: 05/11/2020 PCP: Abner Greenspan, MD   Brief Narrative:   Jonathan [name] is a 79 y.o. male past medical history of hypertension diabetes COPD not oxygen dependent who was recently seen in the ED on 05/10/2020 for falls evaluation did not showed significant symptoms, tested positive for SARS-CoV-2 he did get Regeneron, was not hypoxic is brought in by the family for ongoing falls, in the ED was found to be hypoxic.  The patient is a very nice person which does not want to provide a history and refuses to have a conversation. Was hypoxic satting 12 with a procalcitonin less than 0.1, UA showed specific gravity of 1024 0 white blood cells chest x-ray showed no acute cardiopulmonary disease.   Assessment & Plan:   Active Problems:   Essential hypertension   COPD (chronic obstructive pulmonary disease) (HCC)   Acute respiratory failure with hypoxia (HCC)   Type 2 diabetes mellitus with hyperglycemia, with long-term current use of insulin (HCC)   Pneumonia due to COVID-19 virus   Acute respiratory failure with hypoxia due to pneumonia due to COVID-19 virus: Chest x-ray without overt findings, no notable infiltrate or opacifications Status post Regeneron in the outpatient setting Continue Remdesivir 05/11/2020 through 05/15/2020  Continue IV steroids through 05/20/2020  Strict I's and O's and daily weights, out of bed to chair, when in bed prone for at least 16 hours a day, use incentive spirometry and flutter valve.  Continue to wean oxygen as tolerated, daily oxygen ambulatory screenings to evaluate for dyspnea with exertion and hypoxia with exertion. SpO2: 93 % O2 Flow Rate (L/min): 2 L/min  Recent Labs    05/10/20 0951 05/12/20 0437 05/12/20 0438 05/13/20 0500  DDIMER 0.95* 1.09*  --  0.50  FERRITIN 163  --   --   --   CRP  --   --  12.7* 9.1*   Lab Results  Component Value Date   SARSCOV2NAA POSITIVE (A)  05/10/2020   Ambulatory dysfunction  -Patient had what appears to be mechanical slip and fall overnight, no lingering symptoms -PT OT to evaluate to ensure safe ambulation, continue fall risk protocol, close monitoring/bed alarm  Essential hypertension: No home medications noted on med rec, will verify with pharmacy  Minimally elevated troponin, likely supply demand mismatch in the setting of hypoxia as above EKG shows normal sinus rhythm without any ST elevations or depressions -continue to follow clinically Currently no indication to involve cardiology given patient is asymptomatic without EKG changes Telemetry ongoing  COPD (chronic obstructive pulmonary disease) ([place]), not in acute exacerbation Patient appears to be without acute exacerbation, no wheeze/sputum production noted prior to admission  Markedly uncontrolled type 2 diabetes mellitus with hyperglycemia, with long-term current use of insulin ([place]) Patient's home medications include 70/30 and metformin - held at admission Continue long-acting insulin plus sliding scale. Continue to monitor glucose, hypoglycemic protocol ongoing Lab Results  Component Value Date   HGBA1C 12.4 (A) 04/10/2020    DVT Prophylaxis: lovenox Code Status: full  Family Communication: Patient to update  Status is: Inpatient  Dispo: The patient is from: Home              Anticipated d/c is to: Home              Anticipated d/c date is: 24 hours pending clinical course              Patient currently  not medically stable for discharge in the setting of acute severe COVID-19 pneumonia with ongoing symptoms, hypoxia need for IV fluids, IV steroids, IV antivirals as well as IV immunomodulators; as well as close monitoring in the setting of ambulatory dysfunction and hypoxia.  Consultants:   None  Procedures:   None indicated  Antimicrobials:  Remdesivir  Subjective: No acute issues or events overnight; patient indicates his respiratory  status is markedly improving but still somewhat dyspneic even with minimal exertion around his room.  Denies nausea, vomiting, diarrhea, constipation, headache, fevers, chills.  Objective: Vitals:   05/13/20 0306 05/13/20 0500 05/13/20 0546 05/13/20 0700  BP: (!) 122/53 125/60  (!) 128/57  Pulse: 74 64 72 65  Resp: 18 18 18 18   Temp:      TempSrc:      SpO2: 95% 93% 95% 93%  Weight:      Height:       No intake or output data in the 24 hours ending 05/13/20 0727 Filed Weights   05/11/20 0544  Weight: 71.2 kg    Examination:  General:  Pleasantly resting in bed, No acute distress. HEENT:  Normocephalic atraumatic.  Sclerae nonicteric, noninjected.  Extraocular movements intact bilaterally. Neck:  Without mass or deformity.  Trachea is midline. Lungs:  Clear to auscultate bilaterally without rhonchi, wheeze, or rales. Heart:  Regular rate and rhythm.  Without murmurs, rubs, or gallops. Abdomen:  Soft, nontender, nondistended.  Without guarding or rebound. Extremities: Without cyanosis, clubbing, edema, or obvious deformity. Vascular:  Dorsalis pedis and posterior tibial pulses palpable bilaterally. Skin:  Warm and dry, no erythema, no ulcerations.    Data Reviewed: I have personally reviewed following labs and imaging studies  CBC: Recent Labs  Lab 05/10/20 0951 05/11/20 0611 05/12/20 0437 05/13/20 0500  WBC 7.6 9.5 10.5 15.3*  NEUTROABS 5.6 6.4 8.6* 12.7*  HGB 16.8 17.9* 14.6 17.1*  HCT 50.5 54.6* 43.8 51.5  MCV 89.9 88.9 89.4 88.0  PLT 225 247 205 811   Basic Metabolic Panel: Recent Labs  Lab 05/10/20 0951 05/11/20 0611 05/11/20 1451 05/12/20 0437 05/13/20 0500  NA 134* 135  --  139 139  K 3.7 4.1  --  3.6 4.1  CL 102 93*  --  106 100  CO2 25 25  --  23 26  GLUCOSE 193* 138*  --  177* 121*  BUN 11 18  --  24* 24*  CREATININE 0.83 1.10 1.13 0.76 0.83  CALCIUM 7.0* 8.8*  --  6.5* 8.3*   GFR: Estimated Creatinine Clearance: 62.8 mL/min (by C-G formula  based on SCr of 0.83 mg/dL). Liver Function Tests: Recent Labs  Lab 05/10/20 0951 05/11/20 0611 05/12/20 0437 05/13/20 0500  AST 17 32 25 27  ALT 11 16 12 16   ALKPHOS 60 68 48 62  BILITOT 1.3* 1.5* 1.0 0.9  PROT 5.8* 7.0 5.2* 6.3*  ALBUMIN 2.9* 3.6 2.4* 3.1*   No results for input(s): LIPASE, AMYLASE in the last 168 hours. No results for input(s): AMMONIA in the last 168 hours. Coagulation Profile: No results for input(s): INR, PROTIME in the last 168 hours. Cardiac Enzymes: No results for input(s): CKTOTAL, CKMB, CKMBINDEX, TROPONINI in the last 168 hours. BNP (last 3 results) No results for input(s): PROBNP in the last 8760 hours. HbA1C: No results for input(s): HGBA1C in the last 72 hours. CBG: Recent Labs  Lab 05/11/20 2200 05/12/20 0752 05/12/20 1137 05/12/20 1635 05/12/20 2103  GLUCAP 169* 197* 159* 114* 253*  Lipid Profile: No results for input(s): CHOL, HDL, LDLCALC, TRIG, CHOLHDL, LDLDIRECT in the last 72 hours. Thyroid Function Tests: No results for input(s): TSH, T4TOTAL, FREET4, T3FREE, THYROIDAB in the last 72 hours. Anemia Panel: Recent Labs    05/10/20 0951  FERRITIN 163   Sepsis Labs: Recent Labs  Lab 05/10/20 0951  PROCALCITON <0.10    Recent Results (from the past 240 hour(s))  SARS Coronavirus 2 by RT PCR (hospital order, performed in [facility] hospital lab) Nasopharyngeal Nasopharyngeal Swab     Status: Abnormal   Collection Time: 05/10/20  9:53 AM   Specimen: Nasopharyngeal Swab  Result Value Ref Range Status   SARS Coronavirus 2 POSITIVE (A) NEGATIVE Final    Comment: RESULT CALLED TO, READ BACK BY AND VERIFIED WITH: T.SMITH RN AT 7948 ON 05/10/20 BY S.VANHOORNE Performed at [facility], [place] [address], [place], [place] [address]   Blood culture (routine x 2)     Status: None (Preliminary result)   Collection Time: 05/11/20  6:11 AM   Specimen: BLOOD  Result Value Ref Range Status   Specimen Description    Final    BLOOD RIGHT ANTECUBITAL Performed at [facility] [address], [place], [place] [address]    Special Requests   Final    BOTTLES DRAWN AEROBIC AND ANAEROBIC Blood Culture adequate volume Performed at [place] [address], [place], [place] [address]    Culture   Final    NO GROWTH 1 DAY Performed at [facility], [place] [address], [place], [place] [address]    Report Status PENDING  Incomplete  Blood culture (routine x 2)     Status: None (Preliminary result)   Collection Time: 05/11/20  6:30 AM   Specimen: BLOOD  Result Value Ref Range Status   Specimen Description   Final    BLOOD LEFT ANTECUBITAL Performed at [facility] [address], [place], [place] [address]    Special Requests   Final    BOTTLES DRAWN AEROBIC AND ANAEROBIC Blood Culture adequate volume Performed at [facility] [address] [place], [place] [address]    Culture   Final    NO GROWTH 1 DAY Performed at [facility], [place] [address] [place],  [address]    Report Status PENDING  Incomplete    Radiology Studies: No results found.  Scheduled Meds: . aspirin  81 mg Oral Daily  . atorvastatin  10 mg Oral Daily  . enoxaparin (LOVENOX) injection  40 mg Subcutaneous Q24H  . fluticasone furoate-vilanterol  1 puff Inhalation Daily  . insulin aspart  0-20 Units Subcutaneous TID WC  . insulin aspart  0-5 Units Subcutaneous QHS  . insulin aspart  6 Units Subcutaneous TID WC  . insulin glargine  20 Units Subcutaneous Daily  . methylPREDNISolone (SOLU-MEDROL) injection  0.5 mg/kg Intravenous Q12H   Continuous Infusions: . remdesivir 100 mg in NS 100 mL Stopped (05/12/20 1100)     LOS: 2 days   Time spent: 18min  Josedaniel Haye C Kimaya Whitlatch, DO [facility]  If 7PM-7AM, please contact night-coverage [email or web address]  05/13/2020, 7:27 AM

## 2020-05-13 NOTE — ED Notes (Signed)
Other than a nagging, occasional cough, he is doing very well on room air.

## 2020-05-14 LAB — CBC WITH DIFFERENTIAL/PLATELET
Abs Immature Granulocytes: 0.07 10*3/uL (ref 0.00–0.07)
Basophils Absolute: 0 10*3/uL (ref 0.0–0.1)
Basophils Relative: 0 %
Eosinophils Absolute: 0 10*3/uL (ref 0.0–0.5)
Eosinophils Relative: 0 %
HCT: 51.7 % (ref 39.0–52.0)
Hemoglobin: 17.2 g/dL — ABNORMAL HIGH (ref 13.0–17.0)
Immature Granulocytes: 1 %
Lymphocytes Relative: 14 %
Lymphs Abs: 1.7 10*3/uL (ref 0.7–4.0)
MCH: 29.3 pg (ref 26.0–34.0)
MCHC: 33.3 g/dL (ref 30.0–36.0)
MCV: 87.9 fL (ref 80.0–100.0)
Monocytes Absolute: 0.8 10*3/uL (ref 0.1–1.0)
Monocytes Relative: 6 %
Neutro Abs: 9.9 10*3/uL — ABNORMAL HIGH (ref 1.7–7.7)
Neutrophils Relative %: 79 %
Platelets: 252 10*3/uL (ref 150–400)
RBC: 5.88 MIL/uL — ABNORMAL HIGH (ref 4.22–5.81)
RDW: 12.9 % (ref 11.5–15.5)
WBC: 12.4 10*3/uL — ABNORMAL HIGH (ref 4.0–10.5)
nRBC: 0 % (ref 0.0–0.2)

## 2020-05-14 LAB — COMPREHENSIVE METABOLIC PANEL
ALT: 17 U/L (ref 0–44)
AST: 24 U/L (ref 15–41)
Albumin: 3.1 g/dL — ABNORMAL LOW (ref 3.5–5.0)
Alkaline Phosphatase: 60 U/L (ref 38–126)
Anion gap: 12 (ref 5–15)
BUN: 23 mg/dL (ref 8–23)
CO2: 27 mmol/L (ref 22–32)
Calcium: 8.3 mg/dL — ABNORMAL LOW (ref 8.9–10.3)
Chloride: 97 mmol/L — ABNORMAL LOW (ref 98–111)
Creatinine, Ser: 0.94 mg/dL (ref 0.61–1.24)
GFR calc Af Amer: >60 mL/min (ref >60)
GFR calc non Af Amer: >60 mL/min (ref >60)
Glucose, Bld: 214 mg/dL — ABNORMAL HIGH (ref 70–99)
Potassium: 4.3 mmol/L (ref 3.5–5.1)
Sodium: 136 mmol/L (ref 135–145)
Total Bilirubin: 0.9 mg/dL (ref 0.3–1.2)
Total Protein: 6.4 g/dL — ABNORMAL LOW (ref 6.5–8.1)

## 2020-05-14 LAB — CBG MONITORING, ED
Glucose-Capillary: 210 mg/dL — ABNORMAL HIGH (ref 70–99)
Glucose-Capillary: 203 mg/dL — ABNORMAL HIGH (ref 70–99)

## 2020-05-14 LAB — D-DIMER, QUANTITATIVE: D-Dimer, Quant: <0.27 ug/mL-FEU (ref 0.00–0.50)

## 2020-05-14 LAB — C-REACTIVE PROTEIN: CRP: 6.7 mg/dL — ABNORMAL HIGH (ref <1.0)

## 2020-05-14 MED ORDER — PREDNISONE 10 MG PO TABS
ORAL_TABLET | ORAL | 0 refills | Status: DC
Start: 2020-05-14 — End: 2020-05-30

## 2020-05-14 NOTE — ED Notes (Signed)
Patient is resting comfortably. 

## 2020-05-14 NOTE — Discharge Summary (Signed)
Physician Discharge Summary  Jonathan [name] HOO:[id number] DOB: [birth year] DOA: 05/11/2020  PCP: Abner Greenspan, MD  Admit date: 05/11/2020 Discharge date: 05/14/2020  Admitted From: Home Disposition: Home  Recommendations for Outpatient Follow-up:  1. Follow up with PCP in 1-2 weeks 2. Please obtain BMP/CBC in one week 3. Report to [place] clinic 05/15/2020 as scheduled  Home Health: None Equipment/Devices: None  Discharge Condition: Stable CODE STATUS: Full Diet recommendation: Diabetic diet as tolerated  Brief/Interim Summary: Jonathan Norville Willettis a 79 y.o.malepast medical history of hypertension diabetes COPD not oxygen dependent who was recently seen in the ED on 05/10/2020 for falls evaluation did not showed significant symptoms, tested positive for SARS-CoV-2 he did get Regeneron, was not hypoxic is brought in by the family for ongoing falls, in the ED was found to be hypoxic. The patient is a very nice person which does not want to provide a history and refuses to have a conversation. Was hypoxic satting 49 with a procalcitonin less than 0.1, UA showed specific gravity of 1024 0 white blood cells chest x-ray showed no acute cardiopulmonary disease.  Patient met as above with acute hypoxic respiratory failure in the setting of COVID-19 pneumonia with concurrent history of COPD hypertension diabetes, patient improved drastically on Remdesivir, Solu-Medrol and supportive care.  Over the past 40 hours patient has been able to be weaned off of oxygen, ambulating now on room air without any ongoing symptoms of fatigue dyspnea or hypoxia.  Patient will be discharged on remainder of steroid taper, final dose of Remdesivir scheduled for 05/15/2020 as discussed.  Patient agreeable to follow-up for last dose.  We discussed at length the need for quarantine for 21 days, he states that this should not be a problem especially as he lives alone.  Patient otherwise stable and agreeable for discharge,  recommend close follow-up with PCP via televisit or phone call, subsequent evaluation after quarantine as well as recommendation for Covid vaccine at bedside.  Patient continues to relay that he has no interest in getting the Covid vaccine.  Discharge Diagnoses:  Active Problems:   Essential hypertension   COPD (chronic obstructive pulmonary disease) (HCC)   Acute respiratory failure with hypoxia (HCC)   Type 2 diabetes mellitus with hyperglycemia, with long-term current use of insulin (HCC)   Pneumonia due to COVID-19 virus  Discharge Instructions  Discharge Instructions    Call MD for:  difficulty breathing, headache or visual disturbances   Complete by: As directed    Call MD for:  extreme fatigue   Complete by: As directed    Call MD for:  temperature >100.4   Complete by: As directed    Diet Carb Modified   Complete by: As directed    Increase activity slowly   Complete by: As directed      Allergies as of 05/14/2020      Reactions   Amoxicillin-pot Clavulanate Other (See Comments)   Severe stomach pain.    Quinapril Hcl Other (See Comments)   back pain   Valsartan Other (See Comments)   back pain   Varenicline Tartrate Other (See Comments)   AMS, hallucinations, night mares      Medication List    STOP taking these medications   fluticasone furoate-vilanterol 100-25 MCG/INH Aepb Commonly known as: BREO ELLIPTA     TAKE these medications   Accu-Chek FastClix Lancet Kit 1 each by Does not apply route QID. DX: E11.65   Accu-Chek Guide test strip Generic drug: glucose blood  USE AS INSTRUCTED TO TEST 4 TIMES DAILY DX:E11.65   albuterol (2.5 MG/3ML) 0.083% nebulizer solution Commonly known as: PROVENTIL Take 3 mLs (2.5 mg total) every 4 (four) hours as needed by nebulization for shortness of breath.   albuterol 108 (90 Base) MCG/ACT inhaler Commonly known as: Ventolin HFA Inhale 2 puffs into the lungs every 6 (six) hours as needed for wheezing.   aspirin 81  MG tablet Take 1 tablet (81 mg total) by mouth daily.   atorvastatin 10 MG tablet Commonly known as: LIPITOR Take 1 tablet (10 mg total) by mouth daily.   Breztri Aerosphere 160-9-4.8 MCG/ACT Aero Generic drug: Budeson-Glycopyrrol-Formoterol Inhale 2 puffs into the lungs 2 (two) times daily.   HumaLOG KwikPen 100 UNIT/ML KwikPen Generic drug: insulin lispro INJECT 8-14 UNITS INTO THE SKIN 3 TIMES DAILY. What changed: See the new instructions.   insulin glargine 100 UNIT/ML Solostar Pen Commonly known as: LANTUS Inject 42 Units into the skin at bedtime.   Insulin Pen Needle 32G X 4 MM Misc Commonly known as: BD Pen Needle Nano U/F Use to inject insulin 4 times a day.   metFORMIN 1000 MG tablet Commonly known as: GLUCOPHAGE TAKE ONE TABLET BY MOUTH  TWICE DAILY What changed:   how much to take  how to take this  when to take this  additional instructions   predniSONE 10 MG tablet Commonly known as: DELTASONE Take 4 tablets (40 mg total) by mouth daily for 3 days, THEN 3 tablets (30 mg total) daily for 3 days, THEN 2 tablets (20 mg total) daily for 3 days, THEN 1 tablet (10 mg total) daily for 3 days. Start taking on: May 14, 2020       Allergies  Allergen Reactions  . Amoxicillin-Pot Clavulanate Other (See Comments)    Severe stomach pain.   . Quinapril Hcl Other (See Comments)    back pain  . Valsartan Other (See Comments)    back pain  . Varenicline Tartrate Other (See Comments)    AMS, hallucinations, night mares    Consultations:  None   Procedures/Studies: DG Chest Portable 1 View  Result Date: 05/11/2020 CLINICAL DATA:  79 year old male positive COVID-19.  Cough. EXAM: PORTABLE CHEST 1 VIEW COMPARISON:  Portable chest 05/10/2020 and earlier. FINDINGS: Portable AP semi upright view at 0312 hours. Stable lung volumes and mediastinal contours. Asymmetric attenuation of left upper lobe bronchovascular markings in keeping with emphysema. No  superimposed pneumothorax, pulmonary edema, pleural effusion, or confluent pulmonary opacity. No acute osseous abnormality identified. IMPRESSION: Emphysema (ICD10-J43.9).  No acute cardiopulmonary abnormality. Electronically Signed   By: Genevie Ann M.D.   On: 05/11/2020 03:32   Portable chest 1 View  Result Date: 05/10/2020 CLINICAL DATA:  Productive cough for several days. EXAM: PORTABLE CHEST 1 VIEW COMPARISON:  12/09/2017 FINDINGS: 0956 hours. Low volumes. Cardiopericardial silhouette is at upper limits of normal for size. The lungs are clear without focal pneumonia, edema, pneumothorax or pleural effusion. The visualized bony structures of the thorax show now acute abnormality. IMPRESSION: Low volume film without acute cardiopulmonary findings. Electronically Signed   By: Misty Stanley M.D.   On: 05/10/2020 10:18     Subjective: No acute issues or events overnight, patient tolerating p.o. well, ambulating around the room this morning upon evaluation with sats in the mid 90s no dyspnea noted.  Patient otherwise denies nausea, vomiting, diarrhea, constipation, headache, fevers, chills.   Discharge Exam: Vitals:   05/14/20 1130 05/14/20 1216  BP: (!) 143/76 [name])  100/54  Pulse: 99 96  Resp: 20 18  Temp:    SpO2: 94% 94%   Vitals:   05/14/20 0948 05/14/20 1016 05/14/20 1130 05/14/20 1216  BP:  (!) 145/64 (!) 143/76 (!) 100/54  Pulse: 91 91 99 96  Resp:  (!) 22 20 18   Temp:      TempSrc:      SpO2: 92% 90% 94% 94%  Weight:      Height:        General: Pt is alert, awake, not in acute distress Cardiovascular: RRR, S1/S2 +, no rubs, no gallops Respiratory: CTA bilaterally, no wheezing, no rhonchi Abdominal: Soft, NT, ND, bowel sounds + Extremities: no edema, no cyanosis    The results of significant diagnostics from this hospitalization (including imaging, microbiology, ancillary and laboratory) are listed below for reference.     Microbiology: Recent Results (from the past 240  hour(s))  SARS Coronavirus 2 by RT PCR (hospital order, performed in [facility] hospital lab) Nasopharyngeal Nasopharyngeal Swab     Status: Abnormal   Collection Time: 05/10/20  9:53 AM   Specimen: Nasopharyngeal Swab  Result Value Ref Range Status   SARS Coronavirus 2 POSITIVE (A) NEGATIVE Final    Comment: RESULT CALLED TO, READ BACK BY AND VERIFIED WITH: T.SMITH RN AT 2774 ON 05/10/20 BY S.VANHOORNE Performed at [facility], [place] [address] [place], [place] [address]   Blood culture (routine x 2)     Status: None (Preliminary result)   Collection Time: 05/11/20  6:11 AM   Specimen: BLOOD  Result Value Ref Range Status   Specimen Description   Final    BLOOD RIGHT ANTECUBITAL Performed at [facility] [address] [place], [place] [address]    Special Requests   Final    BOTTLES DRAWN AEROBIC AND ANAEROBIC Blood Culture adequate volume Performed at [facility] [address], [place], [place] [address]    Culture   Final    NO GROWTH 3 DAYS Performed at [facility], [place] [address], [place], [place] [address]    Report Status PENDING  Incomplete  Blood culture (routine x 2)     Status: None (Preliminary result)   Collection Time: 05/11/20  6:30 AM   Specimen: BLOOD  Result Value Ref Range Status   Specimen Description   Final    BLOOD LEFT ANTECUBITAL Performed at [facility] [address] [place], [place] [address]    Special Requests   Final    BOTTLES DRAWN AEROBIC AND ANAEROBIC Blood Culture adequate volume Performed at [facility] [address] [place], [place] [address]    Culture   Final    NO GROWTH 3 DAYS Performed at [facility], [place] [address], [place],  [address]    Report Status PENDING  Incomplete     Labs: BNP (last 3 results) No results for input(s): BNP in the last 8760 hours. Basic Metabolic Panel: Recent Labs  Lab  05/10/20 0951 05/10/20 0951 05/11/20 0611 05/11/20 1451 05/12/20 0437 05/13/20 0500 05/14/20 0422  NA 134*  --  135  --  139 139 136  K 3.7  --  4.1  --  3.6 4.1 4.3  CL 102  --  93*  --  106 100 97*  CO2 25  --  25  --  23 26 27   GLUCOSE 193*  --  138*  --  177* 121*  214*  BUN 11  --  18  --  24* 24* 23  CREATININE 0.83   < > 1.10 1.13 0.76 0.83 0.94  CALCIUM 7.0*  --  8.8*  --  6.5* 8.3* 8.3*   < > = values in this interval not displayed.   Liver Function Tests: Recent Labs  Lab 05/10/20 0951 05/11/20 0611 05/12/20 0437 05/13/20 0500 05/14/20 0422  AST 17 32 25 27 24   ALT 11 16 12 16 17   ALKPHOS 60 68 48 62 60  BILITOT 1.3* 1.5* 1.0 0.9 0.9  PROT 5.8* 7.0 5.2* 6.3* 6.4*  ALBUMIN 2.9* 3.6 2.4* 3.1* 3.1*   No results for input(s): LIPASE, AMYLASE in the last 168 hours. No results for input(s): AMMONIA in the last 168 hours. CBC: Recent Labs  Lab 05/10/20 0951 05/11/20 0611 05/12/20 0437 05/13/20 0500 05/14/20 0422  WBC 7.6 9.5 10.5 15.3* 12.4*  NEUTROABS 5.6 6.4 8.6* 12.7* 9.9*  HGB 16.8 17.9* 14.6 17.1* 17.2*  HCT 50.5 54.6* 43.8 51.5 51.7  MCV 89.9 88.9 89.4 88.0 87.9  PLT 225 247 205 254 252   Cardiac Enzymes: No results for input(s): CKTOTAL, CKMB, CKMBINDEX, TROPONINI in the last 168 hours. BNP: Invalid input(s): POCBNP CBG: Recent Labs  Lab 05/13/20 1157 05/13/20 1650 05/13/20 2122 05/14/20 0759 05/14/20 1136  GLUCAP 98 181* 293* 210* 203*   D-Dimer Recent Labs    05/13/20 0500 05/14/20 0422  DDIMER 0.50 <0.27   Hgb A1c No results for input(s): HGBA1C in the last 72 hours. Lipid Profile No results for input(s): CHOL, HDL, LDLCALC, TRIG, CHOLHDL, LDLDIRECT in the last 72 hours. Thyroid function studies No results for input(s): TSH, T4TOTAL, T3FREE, THYROIDAB in the last 72 hours.  Invalid input(s): FREET3 Anemia work up No results for input(s): VITAMINB12, FOLATE, FERRITIN, TIBC, IRON, RETICCTPCT in the last 72 hours. Urinalysis     Component Value Date/Time   COLORURINE YELLOW 05/11/2020 0930   APPEARANCEUR CLEAR 05/11/2020 0930   LABSPEC 1.024 05/11/2020 0930   PHURINE 5.0 05/11/2020 0930   GLUCOSEU 50 (A) 05/11/2020 0930   HGBUR MODERATE (A) 05/11/2020 0930   HGBUR large 08/30/2007 1447   BILIRUBINUR NEGATIVE 05/11/2020 0930   BILIRUBINUR neg. 02/21/2014 1503   KETONESUR 20 (A) 05/11/2020 0930   PROTEINUR 100 (A) 05/11/2020 0930   UROBILINOGEN 0.2 02/21/2014 1503   UROBILINOGEN 1.0 12/14/2013 1652   NITRITE NEGATIVE 05/11/2020 0930   LEUKOCYTESUR NEGATIVE 05/11/2020 0930   Sepsis Labs Invalid input(s): PROCALCITONIN,  WBC,  LACTICIDVEN Microbiology Recent Results (from the past 240 hour(s))  SARS Coronavirus 2 by RT PCR (hospital order, performed in [place] hospital lab) Nasopharyngeal Nasopharyngeal Swab     Status: Abnormal   Collection Time: 05/10/20  9:53 AM   Specimen: Nasopharyngeal Swab  Result Value Ref Range Status   SARS Coronavirus 2 POSITIVE (A) NEGATIVE Final    Comment: RESULT CALLED TO, READ BACK BY AND VERIFIED WITH: T.SMITH RN AT 9485 ON 05/10/20 BY S.VANHOORNE Performed at [facility], [place] [address], [place], [place] [address]   Blood culture (routine x 2)     Status: None (Preliminary result)   Collection Time: 05/11/20  6:11 AM   Specimen: BLOOD  Result Value Ref Range Status   Specimen Description   Final    BLOOD RIGHT ANTECUBITAL Performed at [facility] [address], [place], [place] [address]    Special Requests   Final    BOTTLES DRAWN AEROBIC AND  ANAEROBIC Blood Culture adequate volume Performed at [facility] [address], [place], [place] [address]    Culture   Final    NO GROWTH 3 DAYS Performed at [facility], [place] [address], [place], [place] [address]    Report Status PENDING  Incomplete  Blood culture (routine x 2)     Status: None (Preliminary result)   Collection Time: 05/11/20   6:30 AM   Specimen: BLOOD  Result Value Ref Range Status   Specimen Description   Final    BLOOD LEFT ANTECUBITAL Performed at [facility] [address], [place], [place] [address]    Special Requests   Final    BOTTLES DRAWN AEROBIC AND ANAEROBIC Blood Culture adequate volume Performed at [facility] [address], [place], [place] [address]    Culture   Final    NO GROWTH 3 DAYS Performed at [facility], [place] [address], [place], [place] [address]    Report Status PENDING  Incomplete     Time coordinating discharge: Over 30 minutes  SIGNED:   Little Ishikawa, DO [facility] 05/14/2020, 6:34 PM Pager   If 7PM-7AM, please contact night-coverage [email or web address]

## 2020-05-14 NOTE — ED Notes (Signed)
Pateint ambulated in room without assistance, oxygen ranged from 90-94% RA. No distress.

## 2020-05-14 NOTE — Discharge Instructions (Signed)
You are scheduled for a Remdesivir infusion on 8/17 at 2pm.  Please come to [place], you will see a COVID infusion banner by the road.  Enter there and turn left.  There are marked spaces for Infusion.  Call the number on the sign or [phone] and someone will come out and bring you inside.  If someone is driving you please come to the same area and call the number and someone will come outside to get you. Thank you!

## 2020-05-14 NOTE — Care Management Important Message (Signed)
Important Message  Patient Details  Name: MAKAR SLATTER MRN: [id number] Date of Birth: 03-28-1941   Medicare Important Message Given:  Yes   Provided copy to patient, patient is positive for COVID, unable to obtain signature. ED RN will discuss with patient at discharge.   Erenest Rasher, RN 05/14/2020, 12:23 PM

## 2020-05-14 NOTE — ED Notes (Signed)
SW notified of impending discharge, she will call [facility] to take patient back home.

## 2020-05-14 NOTE — Progress Notes (Signed)
Patient scheduled for a Remdesivir infusion at 2pm on 8/17.  Have patient come to [place], you will see a COVID infusion banner by the road.  Enter there and turn left.  There are marked spaces for Infusion.  Call the number on the sign or [phone] and someone will come out and bring you inside.  If someone is driving you please come to the same area and call the number and someone will come outside to get you. Thank you!

## 2020-05-14 NOTE — Social Work (Signed)
TOC CSW spoke with [place], Therapist, sports pt needs transportation home.  Pt signed Buyer, retail and is being transported home to [facility] at [facility], [place], [place].  [facility] has been called for pt.    CSW will continue to follow for dc needs.  Marigny Borre Tarpley-Carter, MSW, LCSW-A                  [facility] ED Transitions of CareClinical Social Worker [email or web address] [phone]

## 2020-05-15 ENCOUNTER — Ambulatory Visit (HOSPITAL_COMMUNITY): Payer: Medicare Other

## 2020-05-16 LAB — CULTURE, BLOOD (ROUTINE X 2)
Culture: NO GROWTH
Culture: NO GROWTH
Special Requests: ADEQUATE
Special Requests: ADEQUATE

## 2020-05-18 ENCOUNTER — Emergency Department: Payer: Medicare Other

## 2020-05-18 ENCOUNTER — Other Ambulatory Visit: Payer: Self-pay

## 2020-05-18 ENCOUNTER — Inpatient Hospital Stay
Admission: EM | Admit: 2020-05-18 | Discharge: 2020-05-29 | DRG: 166 | Disposition: A | Discharge status: Hospice | Payer: Medicare Other | Attending: Internal Medicine | Admitting: Internal Medicine

## 2020-05-18 ENCOUNTER — Inpatient Hospital Stay: Payer: Medicare Other

## 2020-05-18 ENCOUNTER — Encounter: Payer: Self-pay | Admitting: Emergency Medicine

## 2020-05-18 ENCOUNTER — Telehealth: Payer: Self-pay

## 2020-05-18 DIAGNOSIS — S2241XA Multiple fractures of ribs, right side, initial encounter for closed fracture: Secondary | ICD-10-CM | POA: Diagnosis not present

## 2020-05-18 DIAGNOSIS — Z515 Encounter for palliative care: Secondary | ICD-10-CM | POA: Diagnosis not present

## 2020-05-18 DIAGNOSIS — Z6826 Body mass index (BMI) 26.0-26.9, adult: Secondary | ICD-10-CM | POA: Diagnosis not present

## 2020-05-18 DIAGNOSIS — W19XXXA Unspecified fall, initial encounter: Secondary | ICD-10-CM | POA: Diagnosis present

## 2020-05-18 DIAGNOSIS — U071 COVID-19: Secondary | ICD-10-CM | POA: Diagnosis present

## 2020-05-18 DIAGNOSIS — R5381 Other malaise: Secondary | ICD-10-CM

## 2020-05-18 DIAGNOSIS — J441 Chronic obstructive pulmonary disease with (acute) exacerbation: Secondary | ICD-10-CM | POA: Diagnosis present

## 2020-05-18 DIAGNOSIS — Z880 Allergy status to penicillin: Secondary | ICD-10-CM

## 2020-05-18 DIAGNOSIS — I5031 Acute diastolic (congestive) heart failure: Secondary | ICD-10-CM | POA: Diagnosis not present

## 2020-05-18 DIAGNOSIS — Z720 Tobacco use: Secondary | ICD-10-CM | POA: Diagnosis present

## 2020-05-18 DIAGNOSIS — Z7951 Long term (current) use of inhaled steroids: Secondary | ICD-10-CM

## 2020-05-18 DIAGNOSIS — Z825 Family history of asthma and other chronic lower respiratory diseases: Secondary | ICD-10-CM

## 2020-05-18 DIAGNOSIS — R54 Age-related physical debility: Secondary | ICD-10-CM | POA: Diagnosis present

## 2020-05-18 DIAGNOSIS — Z7189 Other specified counseling: Secondary | ICD-10-CM

## 2020-05-18 DIAGNOSIS — B9561 Methicillin susceptible Staphylococcus aureus infection as the cause of diseases classified elsewhere: Secondary | ICD-10-CM | POA: Diagnosis present

## 2020-05-18 DIAGNOSIS — E1165 Type 2 diabetes mellitus with hyperglycemia: Secondary | ICD-10-CM | POA: Diagnosis present

## 2020-05-18 DIAGNOSIS — Y92019 Unspecified place in single-family (private) house as the place of occurrence of the external cause: Secondary | ICD-10-CM

## 2020-05-18 DIAGNOSIS — R627 Adult failure to thrive: Secondary | ICD-10-CM | POA: Diagnosis present

## 2020-05-18 DIAGNOSIS — Z794 Long term (current) use of insulin: Secondary | ICD-10-CM

## 2020-05-18 DIAGNOSIS — M7502 Adhesive capsulitis of left shoulder: Secondary | ICD-10-CM | POA: Diagnosis present

## 2020-05-18 DIAGNOSIS — R918 Other nonspecific abnormal finding of lung field: Secondary | ICD-10-CM | POA: Diagnosis present

## 2020-05-18 DIAGNOSIS — I10 Essential (primary) hypertension: Secondary | ICD-10-CM | POA: Diagnosis present

## 2020-05-18 DIAGNOSIS — E876 Hypokalemia: Secondary | ICD-10-CM | POA: Diagnosis present

## 2020-05-18 DIAGNOSIS — Z888 Allergy status to other drugs, medicaments and biological substances status: Secondary | ICD-10-CM

## 2020-05-18 DIAGNOSIS — E785 Hyperlipidemia, unspecified: Secondary | ICD-10-CM | POA: Diagnosis present

## 2020-05-18 DIAGNOSIS — J9601 Acute respiratory failure with hypoxia: Secondary | ICD-10-CM | POA: Diagnosis present

## 2020-05-18 DIAGNOSIS — Z66 Do not resuscitate: Secondary | ICD-10-CM | POA: Diagnosis not present

## 2020-05-18 DIAGNOSIS — S2239XA Fracture of one rib, unspecified side, initial encounter for closed fracture: Secondary | ICD-10-CM

## 2020-05-18 DIAGNOSIS — M79645 Pain in left finger(s): Secondary | ICD-10-CM

## 2020-05-18 DIAGNOSIS — I700 Atherosclerosis of aorta: Secondary | ICD-10-CM | POA: Diagnosis present

## 2020-05-18 DIAGNOSIS — Z7982 Long term (current) use of aspirin: Secondary | ICD-10-CM

## 2020-05-18 DIAGNOSIS — Z87442 Personal history of urinary calculi: Secondary | ICD-10-CM

## 2020-05-18 DIAGNOSIS — IMO0002 Reserved for concepts with insufficient information to code with codable children: Secondary | ICD-10-CM | POA: Diagnosis present

## 2020-05-18 DIAGNOSIS — I70228 Atherosclerosis of native arteries of extremities with rest pain, other extremity: Secondary | ICD-10-CM | POA: Diagnosis present

## 2020-05-18 DIAGNOSIS — R7881 Bacteremia: Secondary | ICD-10-CM | POA: Diagnosis present

## 2020-05-18 DIAGNOSIS — Z833 Family history of diabetes mellitus: Secondary | ICD-10-CM

## 2020-05-18 DIAGNOSIS — S2231XA Fracture of one rib, right side, initial encounter for closed fracture: Secondary | ICD-10-CM | POA: Diagnosis present

## 2020-05-18 DIAGNOSIS — J9611 Chronic respiratory failure with hypoxia: Secondary | ICD-10-CM | POA: Diagnosis not present

## 2020-05-18 DIAGNOSIS — G8929 Other chronic pain: Secondary | ICD-10-CM | POA: Diagnosis present

## 2020-05-18 DIAGNOSIS — Z8673 Personal history of transient ischemic attack (TIA), and cerebral infarction without residual deficits: Secondary | ICD-10-CM

## 2020-05-18 DIAGNOSIS — F17210 Nicotine dependence, cigarettes, uncomplicated: Secondary | ICD-10-CM | POA: Diagnosis present

## 2020-05-18 DIAGNOSIS — R0902 Hypoxemia: Secondary | ICD-10-CM

## 2020-05-18 DIAGNOSIS — M25512 Pain in left shoulder: Secondary | ICD-10-CM

## 2020-05-18 DIAGNOSIS — J439 Emphysema, unspecified: Secondary | ICD-10-CM | POA: Diagnosis present

## 2020-05-18 DIAGNOSIS — T17908A Unspecified foreign body in respiratory tract, part unspecified causing other injury, initial encounter: Secondary | ICD-10-CM

## 2020-05-18 DIAGNOSIS — R296 Repeated falls: Secondary | ICD-10-CM | POA: Diagnosis present

## 2020-05-18 DIAGNOSIS — I998 Other disorder of circulatory system: Secondary | ICD-10-CM | POA: Diagnosis not present

## 2020-05-18 DIAGNOSIS — Z79899 Other long term (current) drug therapy: Secondary | ICD-10-CM

## 2020-05-18 DIAGNOSIS — I2510 Atherosclerotic heart disease of native coronary artery without angina pectoris: Secondary | ICD-10-CM | POA: Diagnosis present

## 2020-05-18 DIAGNOSIS — Z8249 Family history of ischemic heart disease and other diseases of the circulatory system: Secondary | ICD-10-CM

## 2020-05-18 LAB — BLOOD GAS, VENOUS
Acid-Base Excess: 1.6 mmol/L (ref 0.0–2.0)
Bicarbonate: 27.2 mmol/L (ref 20.0–28.0)
O2 Saturation: 76.9 %
Patient temperature: 37
pCO2, Ven: 45 mmHg (ref 44.0–60.0)
pH, Ven: 7.39 (ref 7.250–7.430)
pO2, Ven: 42 mmHg (ref 32.0–45.0)

## 2020-05-18 LAB — CBC WITH DIFFERENTIAL/PLATELET
Abs Immature Granulocytes: 0.47 10*3/uL — ABNORMAL HIGH (ref 0.00–0.07)
Basophils Absolute: 0.1 10*3/uL (ref 0.0–0.1)
Basophils Relative: 0 %
Eosinophils Absolute: 0 10*3/uL (ref 0.0–0.5)
Eosinophils Relative: 0 %
HCT: 52.9 % — ABNORMAL HIGH (ref 39.0–52.0)
Hemoglobin: 17.7 g/dL — ABNORMAL HIGH (ref 13.0–17.0)
Immature Granulocytes: 2 %
Lymphocytes Relative: 7 %
Lymphs Abs: 1.7 10*3/uL (ref 0.7–4.0)
MCH: 29.4 pg (ref 26.0–34.0)
MCHC: 33.5 g/dL (ref 30.0–36.0)
MCV: 87.7 fL (ref 80.0–100.0)
Monocytes Absolute: 1.1 10*3/uL — ABNORMAL HIGH (ref 0.1–1.0)
Monocytes Relative: 5 %
Neutro Abs: 20.8 10*3/uL — ABNORMAL HIGH (ref 1.7–7.7)
Neutrophils Relative %: 86 %
Platelets: 329 10*3/uL (ref 150–400)
RBC: 6.03 MIL/uL — ABNORMAL HIGH (ref 4.22–5.81)
RDW: 13.1 % (ref 11.5–15.5)
WBC: 24.2 10*3/uL — ABNORMAL HIGH (ref 4.0–10.5)
nRBC: 0 % (ref 0.0–0.2)

## 2020-05-18 LAB — COMPREHENSIVE METABOLIC PANEL WITH GFR
ALT: 20 U/L (ref 0–44)
AST: 24 U/L (ref 15–41)
Albumin: 2.8 g/dL — ABNORMAL LOW (ref 3.5–5.0)
Alkaline Phosphatase: 87 U/L (ref 38–126)
Anion gap: 18 — ABNORMAL HIGH (ref 5–15)
BUN: 24 mg/dL — ABNORMAL HIGH (ref 8–23)
CO2: 27 mmol/L (ref 22–32)
Calcium: 8.7 mg/dL — ABNORMAL LOW (ref 8.9–10.3)
Chloride: 90 mmol/L — ABNORMAL LOW (ref 98–111)
Creatinine, Ser: 1.05 mg/dL (ref 0.61–1.24)
GFR calc Af Amer: >60 mL/min
GFR calc non Af Amer: >60 mL/min
Glucose, Bld: 285 mg/dL — ABNORMAL HIGH (ref 70–99)
Potassium: 3.5 mmol/L (ref 3.5–5.1)
Sodium: 135 mmol/L (ref 135–145)
Total Bilirubin: 2 mg/dL — ABNORMAL HIGH (ref 0.3–1.2)
Total Protein: 7.2 g/dL (ref 6.5–8.1)

## 2020-05-18 LAB — FERRITIN: Ferritin: 833 ng/mL — ABNORMAL HIGH (ref 24–336)

## 2020-05-18 LAB — C-REACTIVE PROTEIN: CRP: 30.9 mg/dL — ABNORMAL HIGH (ref <1.0)

## 2020-05-18 LAB — TRIGLYCERIDES: Triglycerides: 117 mg/dL

## 2020-05-18 LAB — FIBRIN DERIVATIVES D-DIMER (ARMC ONLY): Fibrin derivatives D-dimer (ARMC): 1671 ng/mL (FEU) — ABNORMAL HIGH (ref 0.00–499.00)

## 2020-05-18 LAB — PROCALCITONIN: Procalcitonin: 0.29 ng/mL

## 2020-05-18 LAB — LACTIC ACID, PLASMA
Lactic Acid, Venous: 2.6 mmol/L (ref 0.5–1.9)
Lactic Acid, Venous: 2.2 mmol/L (ref 0.5–1.9)

## 2020-05-18 LAB — FIBRINOGEN: Fibrinogen: >750 mg/dL — ABNORMAL HIGH (ref 210–475)

## 2020-05-18 LAB — LACTATE DEHYDROGENASE: LDH: 174 U/L (ref 98–192)

## 2020-05-18 LAB — GLUCOSE, CAPILLARY
Glucose-Capillary: 276 mg/dL — ABNORMAL HIGH (ref 70–99)
Glucose-Capillary: 276 mg/dL — ABNORMAL HIGH (ref 70–99)

## 2020-05-18 MED ORDER — DIPHENHYDRAMINE HCL 50 MG/ML IJ SOLN: 50.0000 mg | Freq: Once | INTRAMUSCULAR | Status: DC | PRN

## 2020-05-18 MED ORDER — NICOTINE 21 MG/24HR TD PT24
21.0000 mg | MEDICATED_PATCH | Freq: Every day | TRANSDERMAL | Status: DC
  Administered 2020-05-18 – 2020-05-29 (×11): 21 mg via TRANSDERMAL
  Filled 2020-05-18 (×11): qty 1

## 2020-05-18 MED ORDER — METHYLPREDNISOLONE SODIUM SUCC 125 MG IJ SOLR: 125.0000 mg | Freq: Once | INTRAMUSCULAR | Status: DC | PRN

## 2020-05-18 MED ORDER — SODIUM CHLORIDE 0.9 % IV SOLN
100.0000 mg | Freq: Once | INTRAVENOUS | Status: AC
  Administered 2020-05-19: 11:00:00 100 mg via INTRAVENOUS
  Filled 2020-05-18: qty 20

## 2020-05-18 MED ORDER — ENOXAPARIN SODIUM 40 MG/0.4ML ~~LOC~~ SOLN
40.0000 mg | SUBCUTANEOUS | Status: DC
  Administered 2020-05-18 – 2020-05-26 (×9): 40 mg via SUBCUTANEOUS
  Filled 2020-05-18 (×9): qty 0.4

## 2020-05-18 MED ORDER — SODIUM CHLORIDE 0.9 % IV SOLN
500.0000 mg | Freq: Once | INTRAVENOUS | Status: AC
  Administered 2020-05-18: 500 mg via INTRAVENOUS
  Filled 2020-05-18: qty 500

## 2020-05-18 MED ORDER — SODIUM CHLORIDE 0.9 % IV SOLN
2.0000 g | Freq: Once | INTRAVENOUS | Status: AC
  Administered 2020-05-18: 2 g via INTRAVENOUS
  Filled 2020-05-18: qty 20

## 2020-05-18 MED ORDER — SODIUM CHLORIDE 0.9% FLUSH
3.0000 mL | Freq: Two times a day (BID) | INTRAVENOUS | Status: DC
  Administered 2020-05-18: 3 mL via INTRAVENOUS

## 2020-05-18 MED ORDER — PREDNISONE 20 MG PO TABS
40.0000 mg | ORAL_TABLET | Freq: Every day | ORAL | Status: DC
  Administered 2020-05-19: 22:00:00 40 mg via ORAL
  Filled 2020-05-18 (×2): qty 2

## 2020-05-18 MED ORDER — SODIUM CHLORIDE 0.9 % IV SOLN: 100.0000 mg | Freq: Once | INTRAVENOUS | Status: DC

## 2020-05-18 MED ORDER — FAMOTIDINE IN NACL 20-0.9 MG/50ML-% IV SOLN: 20.0000 mg | Freq: Once | INTRAVENOUS | Status: DC | PRN

## 2020-05-18 MED ORDER — OXYCODONE HCL 5 MG PO TABS
5.0000 mg | ORAL_TABLET | Freq: Four times a day (QID) | ORAL | Status: DC | PRN
  Administered 2020-05-18 – 2020-05-29 (×10): 5 mg via ORAL
  Filled 2020-05-18 (×10): qty 1

## 2020-05-18 MED ORDER — INSULIN ASPART 100 UNIT/ML ~~LOC~~ SOLN
0.0000 [IU] | Freq: Three times a day (TID) | SUBCUTANEOUS | Status: DC
  Administered 2020-05-18 – 2020-05-19 (×2): 8 [IU] via SUBCUTANEOUS
  Administered 2020-05-19: 3 [IU] via SUBCUTANEOUS
  Administered 2020-05-20: 11 [IU] via SUBCUTANEOUS
  Administered 2020-05-21: 18:00:00 2 [IU] via SUBCUTANEOUS
  Administered 2020-05-21 – 2020-05-25 (×3): 3 [IU] via SUBCUTANEOUS
  Administered 2020-05-25: 5 [IU] via SUBCUTANEOUS
  Administered 2020-05-26 – 2020-05-28 (×5): 3 [IU] via SUBCUTANEOUS
  Administered 2020-05-28: 2 [IU] via SUBCUTANEOUS
  Administered 2020-05-29 (×2): 3 [IU] via SUBCUTANEOUS
  Administered 2020-05-29: 2 [IU] via SUBCUTANEOUS
  Filled 2020-05-18 (×18): qty 1

## 2020-05-18 MED ORDER — DIPHENHYDRAMINE HCL 50 MG/ML IJ SOLN
50.0000 mg | Freq: Once | INTRAMUSCULAR | Status: DC | PRN
  Filled 2020-05-18: qty 1

## 2020-05-18 MED ORDER — ASPIRIN EC 81 MG PO TBEC
81.0000 mg | DELAYED_RELEASE_TABLET | Freq: Every day | ORAL | Status: DC
  Administered 2020-05-19 – 2020-05-29 (×9): 81 mg via ORAL
  Filled 2020-05-18 (×9): qty 1

## 2020-05-18 MED ORDER — ALBUTEROL SULFATE HFA 108 (90 BASE) MCG/ACT IN AERS: 2.0000 | INHALATION_SPRAY | Freq: Once | RESPIRATORY_TRACT | Status: DC | PRN

## 2020-05-18 MED ORDER — SODIUM CHLORIDE 0.9 % IV SOLN: 250.0000 mL | INTRAVENOUS | Status: DC | PRN

## 2020-05-18 MED ORDER — EPINEPHRINE 0.3 MG/0.3ML IJ SOAJ: 0.3000 mg | Freq: Once | INTRAMUSCULAR | Status: DC | PRN

## 2020-05-18 MED ORDER — SODIUM CHLORIDE 0.9% FLUSH: 3.0000 mL | INTRAVENOUS | Status: DC | PRN

## 2020-05-18 MED ORDER — ALBUTEROL SULFATE HFA 108 (90 BASE) MCG/ACT IN AERS
2.0000 | INHALATION_SPRAY | Freq: Four times a day (QID) | RESPIRATORY_TRACT | Status: DC | PRN
  Administered 2020-05-25 – 2020-05-26 (×2): 2 via RESPIRATORY_TRACT
  Filled 2020-05-18 (×2): qty 6.7

## 2020-05-18 MED ORDER — INSULIN DETEMIR 100 UNIT/ML ~~LOC~~ SOLN
20.0000 [IU] | Freq: Every day | SUBCUTANEOUS | Status: DC
  Administered 2020-05-18 – 2020-05-19 (×2): 20 [IU] via SUBCUTANEOUS
  Filled 2020-05-18 (×4): qty 0.2

## 2020-05-18 MED ORDER — EPINEPHRINE 0.3 MG/0.3ML IJ SOAJ
0.3000 mg | Freq: Once | INTRAMUSCULAR | Status: DC | PRN
  Filled 2020-05-18: qty 0.3

## 2020-05-18 MED ORDER — ALBUTEROL SULFATE (2.5 MG/3ML) 0.083% IN NEBU
3.0000 mL | INHALATION_SOLUTION | Freq: Once | RESPIRATORY_TRACT | Status: DC | PRN
  Filled 2020-05-18: qty 3

## 2020-05-18 MED ORDER — IOHEXOL 350 MG/ML SOLN
75.0000 mL | Freq: Once | INTRAVENOUS | Status: AC | PRN
  Administered 2020-05-18: 75 mL via INTRAVENOUS

## 2020-05-18 MED ORDER — ATORVASTATIN CALCIUM 10 MG PO TABS
10.0000 mg | ORAL_TABLET | Freq: Every day | ORAL | Status: DC
  Administered 2020-05-19 – 2020-05-29 (×9): 10 mg via ORAL
  Filled 2020-05-18 (×13): qty 1

## 2020-05-18 MED ORDER — SODIUM CHLORIDE 0.9 % IV SOLN: INTRAVENOUS | Status: DC | PRN

## 2020-05-18 MED ORDER — BUDESON-GLYCOPYRROL-FORMOTEROL 160-9-4.8 MCG/ACT IN AERO: 2.0000 | INHALATION_SPRAY | Freq: Two times a day (BID) | RESPIRATORY_TRACT | Status: DC

## 2020-05-18 MED ORDER — SODIUM CHLORIDE 0.9 % IV SOLN
INTRAVENOUS | Status: DC | PRN
  Administered 2020-05-19: 05:00:00 250 mL via INTRAVENOUS

## 2020-05-18 MED ORDER — METHYLPREDNISOLONE SODIUM SUCC 40 MG IJ SOLR
40.0000 mg | Freq: Two times a day (BID) | INTRAMUSCULAR | Status: AC
  Administered 2020-05-18 – 2020-05-19 (×2): 40 mg via INTRAVENOUS
  Filled 2020-05-18 (×2): qty 1

## 2020-05-18 NOTE — Telephone Encounter (Signed)
Spoke to patient's friend, Faith (DPR) in regards to date/time of covid test prior to PFT. Faith stated that patient is currently positive for covid. Faith is aware that PFT will be canceled. Advised Faith that [facility] is not currently scheduling PFT's for August or September, and our office will contact patient to schedule once [facility] is scheduling again.  Faith mentioned during our conversation that patient is very fatigue, weak, cramping and urinating on himself. Patient is sleeping a lot and unable to walk. Faith stated that patient will not take any medication including insulin.  I have recommended ED for further evaluation due to noted symptoms.  Faith voiced her understanding and had no further questions.   Will route to Dr. Patsey Berthold as an Juluis Rainier.  Rhonda, please cancel PFT. Thanks

## 2020-05-18 NOTE — Telephone Encounter (Signed)
Called and spoke with Marcie Bal in Cardiopulmonary and canceled PFT for patient. Rhonda J Cobb

## 2020-05-18 NOTE — ED Triage Notes (Signed)
Pt to ER via EMS from home with c/o increasing SHOB, weakness and general feeling of unwellness.  Pt Diagnosed with Covid on Monday and has continued to decline since that time.

## 2020-05-18 NOTE — H&P (Addendum)
History and Physical    Jonathan [name] UYQ:[id number] DOB: Dec 21, 1940 DOA: 05/18/2020  PCP: Abner Greenspan, MD   Patient coming from: Home  I have personally briefly reviewed patient's old medical records in [place]  Chief Complaint: Shortness of breath   HPI: Jonathan [name] is a 79 y.o. 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  He tested positive for SARS-CoV-2 and received Regeneron and was discharged home. He returned to the emergency room because of recurrent falls and at that time was found to be hypoxic with pulse oximetry of 88% on room air.  Chest x-ray was consistent with his known history of COPD.  He was admitted to the hospital and was treated with remdesivir, Solu-Medrol and supportive care.  He was weaned off oxygen and was said to have ambulated on room air without any further episodes of hypoxia.  He was discharged home on a steroid taper and advised to complete his remdesivir dose as an outpatient. He presents to the emergency room for evaluation of worsening shortness of breath and fatigue.  He has a cough which is nonproductive but denies having any fever or chills.  He was noted to have pulse oximetry of 86% on room air.  He was also noted to have wheezing in the emergency room.  Patient received Solu-Medrol, magnesium and bronchodilator treatment by EMS without any improvement in his symptoms. He denies having any chest pain, no abdominal pain, no nausea, no vomiting, no changes in his bowel habits or urinary symptoms. Chest x-ray reviewed by me shows mild interstitial thickening bilaterally, probably indicative of a degree of chronic bronchitis. No edema or airspace opacity.  No cardiomegaly Venous blood gas shows a pH of 7.39, PCO2 45, PO2 42, sodium 135, potassium 3.5, bicarb 27, glucose 285, BUN 24, creatinine 1.05, LDH 174, lactic acid 2.6,  procalcitonin 0.29, white count 24.2 with a left shift, hemoglobin 17.7, hematocrit 52.9     ED Course: Patient is a 79 year old male who was recently discharged from the hospital after treatment for COVID-19 viral infection.  He presents to the ER today for evaluation of worsening shortness of breath, nonproductive cough and wheezing and was found to be hypoxic with room air pulse oximetry at rest of 86%.  Pulse oximetry improved following oxygen supplementation at 3 L.  Patient was tachycardic and tachypneic and had leukocytosis also received a dose of Rocephin and Zithromax in the ER.  He will be admitted to the hospital for further evaluation.  Review of Systems: As per HPI otherwise 10 point review of systems negative.    Past Medical History:  Diagnosis Date  . Allergic rhinitis   . Asthma   . COPD (chronic obstructive pulmonary disease) ([place])   . Dermatitis seborrheica   . Diabetes mellitus type II   . ED (erectile dysfunction)   . Frozen shoulder   . HLD (hyperlipidemia)   . HTN (hypertension)   . Hyperkalemia   . Kidney stone   . Other diseases of lung, not elsewhere classified   . Pulmonary nodule, right    lower lobe  . Rotator cuff injury    right  . Shoulder pain   . Stroke [facility])    pt states "last year"  . Tobacco abuse     Past Surgical History:  Procedure Laterality Date  . CERVICAL DISCECTOMY  1998  . EEG    .  ETT  11/1994  . TEE WITHOUT CARDIOVERSION  12/03/2011   Procedure: TRANSESOPHAGEAL ECHOCARDIOGRAM (TEE);  Surgeon: Lelon Perla, MD;  Location: [facility] ENDOSCOPY;  Service: Cardiovascular;  Laterality: N/A;  . TM repair       reports that he has been smoking cigarettes. He has a 65.00 pack-year smoking history. He has never used smokeless tobacco. He reports that he does not drink alcohol and does not use drugs.  Allergies  Allergen Reactions  . Amoxicillin-Pot Clavulanate Other (See Comments)    Severe stomach pain.   . Quinapril Hcl Other (See  Comments)    back pain  . Valsartan Other (See Comments)    back pain  . Varenicline Tartrate Other (See Comments)    AMS, hallucinations, night mares    Family History  Problem Relation Age of Onset  . Asthma Sister   . Emphysema Sister   . Diabetes Mother   . Diabetes Brother   . Heart disease Brother      Prior to Admission medications   Medication Sig Start Date End Date Taking? Authorizing Provider  ACCU-CHEK GUIDE test strip USE AS INSTRUCTED TO TEST 4 TIMES DAILY DX:E11.65 06/27/19   Philemon Kingdom, MD  albuterol (PROVENTIL) (2.5 MG/3ML) 0.083% nebulizer solution Take 3 mLs (2.5 mg total) every 4 (four) hours as needed by nebulization for shortness of breath. 08/06/17   Doreatha Lew, MD  albuterol (VENTOLIN HFA) 108 (90 Base) MCG/ACT inhaler Inhale 2 puffs into the lungs every 6 (six) hours as needed for wheezing. 04/09/20   Tower, Wynelle Fanny, MD  aspirin 81 MG tablet Take 1 tablet (81 mg total) by mouth daily. 11/26/13   Ghimire, Henreitta Leber, MD  atorvastatin (LIPITOR) 10 MG tablet Take 1 tablet (10 mg total) by mouth daily. 04/09/20   Tower, Wynelle Fanny, MD  Budeson-Glycopyrrol-Formoterol (BREZTRI AEROSPHERE) 160-9-4.8 MCG/ACT AERO Inhale 2 puffs into the lungs 2 (two) times daily.    [provider]  HUMALOG KWIKPEN 100 UNIT/ML KwikPen INJECT 8-14 UNITS INTO THE SKIN 3 TIMES DAILY. Patient taking differently: Inject 5-16 Units into the skin See admin instructions. Inject 3 times a day with meals and as needed with evening snack: - 10 units with a smaller meal - 12-14 units with a regular meal - 16 units with a larger meal - 5-6 units with evening snack 09/06/19   Philemon Kingdom, MD  insulin glargine (LANTUS) 100 UNIT/ML Solostar Pen Inject 42 Units into the skin at bedtime. 04/10/20   Philemon Kingdom, MD  Insulin Pen Needle (BD PEN NEEDLE NANO U/F) 32G X 4 MM MISC Use to inject insulin 4 times a day. 10/20/18   Philemon Kingdom, MD  Lancets Misc. (ACCU-CHEK  FASTCLIX LANCET) KIT 1 each by Does not apply route QID. DX: E11.65 06/07/18   Philemon Kingdom, MD  metFORMIN (GLUCOPHAGE) 1000 MG tablet TAKE ONE TABLET BY MOUTH  TWICE DAILY Patient taking differently: Take 1,000 mg by mouth 2 (two) times daily with a meal.  01/17/20   Philemon Kingdom, MD  predniSONE (DELTASONE) 10 MG tablet Take 4 tablets (40 mg total) by mouth daily for 3 days, THEN 3 tablets (30 mg total) daily for 3 days, THEN 2 tablets (20 mg total) daily for 3 days, THEN 1 tablet (10 mg total) daily for 3 days. 05/14/20 05/26/20  Little Ishikawa, MD    Physical Exam: Vitals:   05/18/20 1230 05/18/20 1300 05/18/20 1330 05/18/20 1400  BP: (!) 124/54 (!) 114/53 (!) 132/47 [name])  120/51  Pulse: (!) 107  (!) 101 94  Resp: (!) 22 (!) 25 (!) 33 (!) 21  SpO2: 98%  91% 95%  Weight:      Height:         Vitals:   05/18/20 1230 05/18/20 1300 05/18/20 1330 05/18/20 1400  BP: (!) 124/54 (!) 114/53 (!) 132/47 (!) 120/51  Pulse: (!) 107  (!) 101 94  Resp: (!) 22 (!) 25 (!) 33 (!) 21  SpO2: 98%  91% 95%  Weight:      Height:        Constitutional: NAD, alert and oriented x 3.  Chronically ill-appearing. Eyes: PERRL, lids and conjunctivae pallor ENMT: Mucous membranes are moist.  Neck: normal, supple, no masses, no thyromegaly Respiratory: Bilateral air entry, scattered wheezing, no crackles. Normal respiratory effort. No accessory muscle use.  Cardiovascular: Tachycardic, no murmurs / rubs / gallops. No extremity edema. 2+ pedal pulses. No carotid bruits.  Abdomen: no tenderness, no masses palpated. No hepatosplenomegaly. Bowel sounds positive.  Musculoskeletal: no clubbing / cyanosis. No joint deformity upper and lower extremities.  Skin: no rashes, lesions, ulcers.  Neurologic: No gross focal neurologic deficit.  Generalized weakness Psychiatric: Normal mood and affect.   Labs on Admission: I have personally reviewed following labs and imaging studies  CBC: Recent Labs  Lab  05/12/20 0437 05/13/20 0500 05/14/20 0422 05/18/20 1220  WBC 10.5 15.3* 12.4* 24.2*  NEUTROABS 8.6* 12.7* 9.9* 20.8*  HGB 14.6 17.1* 17.2* 17.7*  HCT 43.8 51.5 51.7 52.9*  MCV 89.4 88.0 87.9 87.7  PLT 205 254 252 478   Basic Metabolic Panel: Recent Labs  Lab 05/12/20 0437 05/13/20 0500 05/14/20 0422 05/18/20 1220  NA 139 139 136 135  K 3.6 4.1 4.3 3.5  CL 106 100 97* 90*  CO2 23 26 27 27   GLUCOSE 177* 121* 214* 285*  BUN 24* 24* 23 24*  CREATININE 0.76 0.83 0.94 1.05  CALCIUM 6.5* 8.3* 8.3* 8.7*   GFR: Estimated Creatinine Clearance: 49.6 mL/min (by C-G formula based on SCr of 1.05 mg/dL). Liver Function Tests: Recent Labs  Lab 05/12/20 0437 05/13/20 0500 05/14/20 0422 05/18/20 1220  AST 25 27 24 24   ALT 12 16 17 20   ALKPHOS 48 62 60 87  BILITOT 1.0 0.9 0.9 2.0*  PROT 5.2* 6.3* 6.4* 7.2  ALBUMIN 2.4* 3.1* 3.1* 2.8*   No results for input(s): LIPASE, AMYLASE in the last 168 hours. No results for input(s): AMMONIA in the last 168 hours. Coagulation Profile: No results for input(s): INR, PROTIME in the last 168 hours. Cardiac Enzymes: No results for input(s): CKTOTAL, CKMB, CKMBINDEX, TROPONINI in the last 168 hours. BNP (last 3 results) No results for input(s): PROBNP in the last 8760 hours. HbA1C: No results for input(s): HGBA1C in the last 72 hours. CBG: Recent Labs  Lab 05/13/20 1157 05/13/20 1650 05/13/20 2122 05/14/20 0759 05/14/20 1136  GLUCAP 98 181* 293* 210* 203*   Lipid Profile: Recent Labs    05/18/20 1220  TRIG 117   Thyroid Function Tests: No results for input(s): TSH, T4TOTAL, FREET4, T3FREE, THYROIDAB in the last 72 hours. Anemia Panel: Recent Labs    05/18/20 1220  FERRITIN 833*   Urine analysis:    Component Value Date/Time   COLORURINE YELLOW 05/11/2020 0930   APPEARANCEUR CLEAR 05/11/2020 0930   LABSPEC 1.024 05/11/2020 0930   PHURINE 5.0 05/11/2020 0930   GLUCOSEU 50 (A) 05/11/2020 0930   HGBUR MODERATE (A)  05/11/2020 0930  HGBUR large 08/30/2007 1447   BILIRUBINUR NEGATIVE 05/11/2020 0930   BILIRUBINUR neg. 02/21/2014 1503   KETONESUR 20 (A) 05/11/2020 0930   PROTEINUR 100 (A) 05/11/2020 0930   UROBILINOGEN 0.2 02/21/2014 1503   UROBILINOGEN 1.0 12/14/2013 1652   NITRITE NEGATIVE 05/11/2020 0930   LEUKOCYTESUR NEGATIVE 05/11/2020 0930    Radiological Exams on Admission: DG Chest Port 1 View  Result Date: 05/18/2020 CLINICAL DATA:  Shortness of breath EXAM: PORTABLE CHEST 1 VIEW COMPARISON:  May 11, 2020 FINDINGS: There is mild interstitial thickening bilaterally. No edema or airspace opacity. Heart size and pulmonary vascularity are normal. No adenopathy. There is aortic atherosclerosis. No bone lesions IMPRESSION: Mild interstitial thickening bilaterally, probably indicative of a degree of chronic bronchitis. No edema or airspace opacity. Heart size within normal limits. Aortic Atherosclerosis (ICD10-I70.0). Electronically Signed   By: Lowella Grip III M.D.   On: 05/18/2020 12:34    EKG: Independently reviewed.  Sinus tachycardia  Assessment/Plan Principal Problem:   COPD with acute exacerbation (HCC) Active Problems:   Tobacco abuse   Physical deconditioning   COVID-19 virus infection    COPD with acute exacerbation Patient with an underlying history of COPD and recent COVID-19 viral infection who presents to the ER for evaluation of worsening shortness of breath at rest associated with a nonproductive cough and wheezing. Patient noted to be hypoxic with room air pulse oximetry at rest of 86%, this has improved with oxygen supplementation at 3 L Continue as needed and scheduled bronchodilator therapy Continue inhaled steroids Obtain CTA of the chest for further evaluation and to rule out PE Patient will need to be assessed for home oxygen need prior to discharge   Nicotine dependence Patient admits to smoking 1 and half pack of cigarettes daily Smoking cessation was  discussed with him in detail and he is willing to quit smoking We will place patient on nicotine transdermal patch 21 mg daily   Physical deconditioning Secondary to acute illness We will request PT evaluation   COVID-19 viral infection Patient initially received Regeneron and then 3 doses of remdesivir as well as systemic steroids Continue supportive care   Diabetes mellitus With hyperglycemia Place patient on consistent carbohydrate diet Glycemic control with sliding scale coverage  DVT prophylaxis: Lovenox Code Status: Full code Family Communication: Greater than 50% of time was spent discussing patient's condition and plan of care with him at the bedside.  He verbalizes understanding and agrees with the plan. Disposition Plan: Back to previous home environment Consults called: None    Antanasia Kaczynski MD [facility]     05/18/2020, 3:43 PM

## 2020-05-18 NOTE — Telephone Encounter (Signed)
Needs to go to the ED, need to call EMS to take him to the ED.

## 2020-05-18 NOTE — Telephone Encounter (Signed)
Both patient and Faith are aware of below recommendations. Faith will call EMS now.  Nothing further is needed at this time.

## 2020-05-18 NOTE — ED Notes (Signed)
PT incontinent of urine.  Pt cleaned, linens changed, pt placed in hospital gown.  External catheter placed.  Call bell in reach, will monitor.

## 2020-05-18 NOTE — ED Provider Notes (Signed)
[facility] Emergency Department Provider Note  ____________________________________________  Time seen: Approximately 12:49 PM  I have reviewed the triage vital signs and the nursing notes.   HISTORY  Chief Complaint Shortness of Breath  Level 5 Caveat: Portions of the History and Physical including HPI and review of systems are unable to be completely obtained due to patient being a poor historian    HPI Jonathan [name] is a 78 y.o. male with a history of COPD hypertension stroke and recent Covid infection who is brought to the ED by EMS due to worsening shortness of breath and generalized weakness.  Patient reports loss of appetite, severe dyspnea on exertion at home.  He was admitted to the hospital August 13 through August 16, had at least 3 days of remdesivir, reportedly had Regeneron treatment as well.   EMS initiated treatment with CPAP, gave 2 g IV magnesium, 125 mg IV Solu-Medrol, 1 DuoNeb, 2 albuterol nebs.   Past Medical History:  Diagnosis Date  . Allergic rhinitis   . Asthma   . COPD (chronic obstructive pulmonary disease) (HCC)   . Dermatitis seborrheica   . Diabetes mellitus type II   . ED (erectile dysfunction)   . Frozen shoulder   . HLD (hyperlipidemia)   . HTN (hypertension)   . Hyperkalemia   . Kidney stone   . Other diseases of lung, not elsewhere classified   . Pulmonary nodule, right    lower lobe  . Rotator cuff injury    right  . Shoulder pain   . Stroke (HCC)    pt states "last year"  . Tobacco abuse      Patient Active Problem List   Diagnosis Date Noted  . Pneumonia due to COVID-19 virus 05/11/2020  . Medicare annual wellness visit, subsequent 04/09/2020  . Vision changes 04/09/2020  . Routine general medical examination at a health care facility 11/25/2016  . Localization-related symptomatic epilepsy and epileptic syndromes with complex partial seizures, not intractable, without status epilepticus (HCC)  05/06/2016  . Intracranial vascular stenosis 05/06/2016  . History of stroke 05/06/2016  . Type 2 diabetes mellitus with hyperglycemia, with long-term current use of insulin (HCC) 12/04/2015  . History of shingles 09/08/2014  . Medically noncompliant 05/05/2014  . Chronic respiratory failure (HCC) 12/15/2013  . Acute respiratory failure with hypoxia (HCC) 11/28/2013  . Immunization not carried out because of patient refusal 07/05/2012  . Hyperlipidemia associated with type 2 diabetes mellitus (HCC)   . Pulmonary nodule, right   . Allergic rhinitis   . ED (erectile dysfunction)   . Tobacco abuse   . COPD (chronic obstructive pulmonary disease) (HCC) 03/29/2010  . Essential hypertension 01/05/2007     Past Surgical History:  Procedure Laterality Date  . CERVICAL DISCECTOMY  1998  . EEG    . ETT  11/1994  . TEE WITHOUT CARDIOVERSION  12/03/2011   Procedure: TRANSESOPHAGEAL ECHOCARDIOGRAM (TEE);  Surgeon: Brian S Crenshaw, MD;  Location: [facility] ENDOSCOPY;  Service: Cardiovascular;  Laterality: N/A;  . TM repair       Prior to Admission medications   Medication Sig Start Date End Date Taking? Authorizing Provider  ACCU-CHEK GUIDE test strip USE AS INSTRUCTED TO TEST 4 TIMES DAILY DX:E11.65 06/27/19   Gherghe, Cristina, MD  albuterol (PROVENTIL) (2.5 MG/3ML) 0.083% nebulizer solution Take 3 mLs (2.5 mg total) every 4 (four) hours as needed by nebulization for shortness of breath. 08/06/17   Silva Zapata, Edwin, MD  albuterol (VENTOLIN HFA) 108 (  90 Base) MCG/ACT inhaler Inhale 2 puffs into the lungs every 6 (six) hours as needed for wheezing. 04/09/20   Tower, Wynelle Fanny, MD  aspirin 81 MG tablet Take 1 tablet (81 mg total) by mouth daily. 11/26/13   Ghimire, Henreitta Leber, MD  atorvastatin (LIPITOR) 10 MG tablet Take 1 tablet (10 mg total) by mouth daily. 04/09/20   Tower, Wynelle Fanny, MD  Budeson-Glycopyrrol-Formoterol (BREZTRI AEROSPHERE) 160-9-4.8 MCG/ACT AERO Inhale 2 puffs into the lungs 2 (two) times  daily.    [provider]  HUMALOG KWIKPEN 100 UNIT/ML KwikPen INJECT 8-14 UNITS INTO THE SKIN 3 TIMES DAILY. Patient taking differently: Inject 5-16 Units into the skin See admin instructions. Inject 3 times a day with meals and as needed with evening snack: - 10 units with a smaller meal - 12-14 units with a regular meal - 16 units with a larger meal - 5-6 units with evening snack 09/06/19   Philemon Kingdom, MD  insulin glargine (LANTUS) 100 UNIT/ML Solostar Pen Inject 42 Units into the skin at bedtime. 04/10/20   Philemon Kingdom, MD  Insulin Pen Needle (BD PEN NEEDLE NANO U/F) 32G X 4 MM MISC Use to inject insulin 4 times a day. 10/20/18   Philemon Kingdom, MD  Lancets Misc. (ACCU-CHEK FASTCLIX LANCET) KIT 1 each by Does not apply route QID. DX: E11.65 06/07/18   Philemon Kingdom, MD  metFORMIN (GLUCOPHAGE) 1000 MG tablet TAKE ONE TABLET BY MOUTH  TWICE DAILY Patient taking differently: Take 1,000 mg by mouth 2 (two) times daily with a meal.  01/17/20   Philemon Kingdom, MD  predniSONE (DELTASONE) 10 MG tablet Take 4 tablets (40 mg total) by mouth daily for 3 days, THEN 3 tablets (30 mg total) daily for 3 days, THEN 2 tablets (20 mg total) daily for 3 days, THEN 1 tablet (10 mg total) daily for 3 days. 05/14/20 05/26/20  Little Ishikawa, MD     Allergies Amoxicillin-pot clavulanate, Quinapril hcl, Valsartan, and Varenicline tartrate   Family History  Problem Relation Age of Onset  . Asthma Sister   . Emphysema Sister   . Diabetes Mother   . Diabetes Brother   . Heart disease Brother     Social History Social History   Tobacco Use  . Smoking status: Current Every Day Smoker    Packs/day: 1.00    Years: 65.00    Pack years: 65.00    Types: Cigarettes  . Smokeless tobacco: Never Used  Vaping Use  . Vaping Use: Never used  Substance Use Topics  . Alcohol use: No    Alcohol/week: 0.0 standard drinks    Comment: Quit 40 years ago  . Drug use: No    Review of  Systems  Constitutional:   No fever or chills.  ENT:   No sore throat. No rhinorrhea. Cardiovascular:   No chest pain or syncope. Respiratory:   Positive shortness of breath and nonproductive cough cough. Gastrointestinal:   Negative for abdominal pain, vomiting and diarrhea.  Musculoskeletal:   Negative for focal pain or swelling All other systems reviewed and are negative except as documented above in ROS and HPI.  ____________________________________________   PHYSICAL EXAM:  VITAL SIGNS: ED Triage Vitals  Enc Vitals Group     BP 05/18/20 1212 132/76     Pulse Rate 05/18/20 1212 (!) 105     Resp 05/18/20 1212 (!) 22     Temp --      Temp src --  SpO2 05/18/20 1212 99 %     Weight 05/18/20 1213 156 lb 15.5 oz (71.2 kg)     Height 05/18/20 1213 5' 5" (1.651 m)     Head Circumference --      Peak Flow --      Pain Score 05/18/20 1212 8     Pain Loc --      Pain Edu? --      Excl. in [place]? --     Vital signs reviewed, nursing assessments reviewed.   Constitutional:   Alert and oriented.  Ill-appearing. Eyes:   Conjunctivae are normal. EOMI. PERRL. ENT      Head:   Normocephalic and atraumatic.      Nose: Normal.      Mouth/Throat:   Dry mucous membranes.      Neck:   No meningismus. Full ROM. Hematological/Lymphatic/Immunilogical:   No cervical lymphadenopathy. Cardiovascular:   Tachycardia heart rate 105. Symmetric bilateral radial and DP pulses.  No murmurs. Cap refill less than 2 seconds. Respiratory: Tachypnea with a respiratory rate of 24, mild retractions.  Diffuse inspiratory rhonchi, prolonged expiratory phase, and expiratory wheezing Gastrointestinal:   Soft and nontender. Non distended. There is no CVA tenderness.  No rebound, rigidity, or guarding. Genitourinary:   deferred Musculoskeletal:   Normal range of motion in all extremities. No joint effusions.  No lower extremity tenderness.  No edema. Neurologic:   Normal speech and language.  Motor grossly  intact. No acute focal neurologic deficits are appreciated.  Skin:    Skin is warm, dry and intact. No rash noted.  No petechiae, purpura, or bullae.  ____________________________________________    LABS (pertinent positives/negatives) (all labs ordered are listed, but only abnormal results are displayed) Labs Reviewed  CBC WITH DIFFERENTIAL/PLATELET - Abnormal; Notable for the following components:      Result Value   WBC 24.2 (*)    RBC 6.03 (*)    Hemoglobin 17.7 (*)    HCT 52.9 (*)    Neutro Abs 20.8 (*)    Monocytes Absolute 1.1 (*)    Abs Immature Granulocytes 0.47 (*)    All other components within normal limits  CULTURE, BLOOD (ROUTINE X 2)  CULTURE, BLOOD (ROUTINE X 2)  LACTIC ACID, PLASMA  LACTIC ACID, PLASMA  COMPREHENSIVE METABOLIC PANEL  FIBRIN DERIVATIVES D-DIMER ([facility] ONLY)  PROCALCITONIN  LACTATE DEHYDROGENASE  FERRITIN  TRIGLYCERIDES  FIBRINOGEN  C-REACTIVE PROTEIN   ____________________________________________   EKG  Interpreted by me Sinus tachycardia rate 105.  Normal axis and intervals.  Normal QRS ST segments and T waves.  No evidence of right heart strain.  ____________________________________________    RADIOLOGY  DG Chest Port 1 View  Result Date: 05/18/2020 CLINICAL DATA:  Shortness of breath EXAM: PORTABLE CHEST 1 VIEW COMPARISON:  May 11, 2020 FINDINGS: There is mild interstitial thickening bilaterally. No edema or airspace opacity. Heart size and pulmonary vascularity are normal. No adenopathy. There is aortic atherosclerosis. No bone lesions IMPRESSION: Mild interstitial thickening bilaterally, probably indicative of a degree of chronic bronchitis. No edema or airspace opacity. Heart size within normal limits. Aortic Atherosclerosis (ICD10-I70.0). Electronically Signed   By: Lowella Grip III M.D.   On: 05/18/2020 12:34    ____________________________________________   PROCEDURES .Critical Care Performed by: Carrie Mew, MD Authorized by: Carrie Mew, MD   Critical care provider statement:    Critical care time (minutes):  35   Critical care time was exclusive of:  Separately billable procedures and treating  other patients   Critical care was necessary to treat or prevent imminent or life-threatening deterioration of the following conditions:  Respiratory failure and sepsis   Critical care was time spent personally by me on the following activities:  Development of treatment plan with patient or surrogate, discussions with consultants, evaluation of patient's response to treatment, examination of patient, obtaining history from patient or surrogate, ordering and performing treatments and interventions, ordering and review of laboratory studies, ordering and review of radiographic studies, pulse oximetry, re-evaluation of patient's condition and review of old charts    ____________________________________________  DIFFERENTIAL DIAGNOSIS   COPD exacerbation, worsening Covid pneumonitis, bacterial pneumonia, pneumothorax, pulmonary edema, pleural effusion  CLINICAL IMPRESSION / ASSESSMENT AND PLAN / ED COURSE  Medications ordered in the ED: Medications  cefTRIAXone (ROCEPHIN) 2 g in sodium chloride 0.9 % 100 mL IVPB (has no administration in time range)  azithromycin (ZITHROMAX) 500 mg in sodium chloride 0.9 % 250 mL IVPB (has no administration in time range)    Pertinent labs & imaging results that were available during my care of the patient were reviewed by me and considered in my medical decision making (see chart for details).  Jonathan [name] was evaluated in Emergency Department on 05/18/2020 for the symptoms described in the history of present illness. He was evaluated in the context of the global COVID-19 pandemic, which necessitated consideration that the patient might be at risk for infection with the SARS-CoV-2 virus that causes COVID-19. Institutional protocols and algorithms that  pertain to the evaluation of patients at risk for COVID-19 are in a state of rapid change based on information released by regulatory bodies including the CDC and federal and state organizations. These policies and algorithms were followed during the patient's care in the ED.     Clinical Course as of May 18 1252  Fri May 18, 2020  1224 Patient presents with worsening shortness of breath in the setting of recent Covid diagnosis 4 days ago.  He is wheezing and prolonged expirations, concerning for exacerbation of undiagnosed COPD.  Chest x-ray shows diffuse hazy opacities.  He has severe fatigue.  Appears dehydrated.    He has received IV Solu-Medrol, magnesium, and 3 neb treatments by EMS without significant improvement in symptoms. Will check labs, order remdesivir and plan to admit given his age and comorbidities.   [PS]  1248 Oxygen saturation 86% on room air with good waveform.  Nasal cannula applied.  Has a leukocytosis of 24,000 with tachycardia tachypnea, concerning for sepsis.  Will give ceftriaxone and azithromycin.  Will need to admit.   [PS]    Clinical Course User Index [PS] , , MD     ____________________________________________   FINAL CLINICAL IMPRESSION(S) / ED DIAGNOSES    Final diagnoses:  COPD exacerbation (HCC)  Acute respiratory failure with hypoxia (HCC)  COVID-19 virus infection     ED Discharge Orders    None      Portions of this note were generated with dragon dictation software. Dictation errors may occur despite best attempts at proofreading.   , , MD 05/18/20 1253  

## 2020-05-18 NOTE — Telephone Encounter (Signed)
PFT needs to be R/S in October.  Note placed in recall folder for PFT. Rhonda J Cobb

## 2020-05-19 ENCOUNTER — Inpatient Hospital Stay: Payer: Medicare Other

## 2020-05-19 ENCOUNTER — Encounter: Payer: Self-pay | Admitting: Internal Medicine

## 2020-05-19 ENCOUNTER — Inpatient Hospital Stay (HOSPITAL_COMMUNITY)
Admit: 2020-05-19 | Discharge: 2020-05-19 | Disposition: A | Discharge status: Home / Self Care | Payer: Medicare Other | Attending: Family Medicine | Admitting: Family Medicine

## 2020-05-19 DIAGNOSIS — R7881 Bacteremia: Secondary | ICD-10-CM

## 2020-05-19 DIAGNOSIS — J9611 Chronic respiratory failure with hypoxia: Secondary | ICD-10-CM

## 2020-05-19 DIAGNOSIS — I5031 Acute diastolic (congestive) heart failure: Secondary | ICD-10-CM

## 2020-05-19 DIAGNOSIS — S2239XA Fracture of one rib, unspecified side, initial encounter for closed fracture: Secondary | ICD-10-CM

## 2020-05-19 DIAGNOSIS — J439 Emphysema, unspecified: Secondary | ICD-10-CM

## 2020-05-19 DIAGNOSIS — Z794 Long term (current) use of insulin: Secondary | ICD-10-CM

## 2020-05-19 DIAGNOSIS — R918 Other nonspecific abnormal finding of lung field: Secondary | ICD-10-CM

## 2020-05-19 DIAGNOSIS — I700 Atherosclerosis of aorta: Secondary | ICD-10-CM

## 2020-05-19 DIAGNOSIS — B9561 Methicillin susceptible Staphylococcus aureus infection as the cause of diseases classified elsewhere: Secondary | ICD-10-CM

## 2020-05-19 DIAGNOSIS — S2241XA Multiple fractures of ribs, right side, initial encounter for closed fracture: Secondary | ICD-10-CM

## 2020-05-19 HISTORY — DX: Emphysema, unspecified: J43.9

## 2020-05-19 HISTORY — DX: Atherosclerosis of aorta: I70.0

## 2020-05-19 LAB — BLOOD CULTURE ID PANEL (REFLEXED) - BCID2

## 2020-05-19 LAB — GLUCOSE, CAPILLARY
Glucose-Capillary: 156 mg/dL — ABNORMAL HIGH (ref 70–99)
Glucose-Capillary: 279 mg/dL — ABNORMAL HIGH (ref 70–99)
Glucose-Capillary: 146 mg/dL — ABNORMAL HIGH (ref 70–99)

## 2020-05-19 LAB — CBC
HCT: 47.9 % (ref 39.0–52.0)
Hemoglobin: 16.5 g/dL (ref 13.0–17.0)
MCH: 29.3 pg (ref 26.0–34.0)
MCHC: 34.4 g/dL (ref 30.0–36.0)
MCV: 85.1 fL (ref 80.0–100.0)
Platelets: 319 10*3/uL (ref 150–400)
RBC: 5.63 MIL/uL (ref 4.22–5.81)
RDW: 12.9 % (ref 11.5–15.5)
WBC: 29.1 10*3/uL — ABNORMAL HIGH (ref 4.0–10.5)
nRBC: 0 % (ref 0.0–0.2)

## 2020-05-19 LAB — BASIC METABOLIC PANEL
Anion gap: 13 (ref 5–15)
BUN: 23 mg/dL (ref 8–23)
CO2: 31 mmol/L (ref 22–32)
Calcium: 8.6 mg/dL — ABNORMAL LOW (ref 8.9–10.3)
Chloride: 92 mmol/L — ABNORMAL LOW (ref 98–111)
Creatinine, Ser: 0.91 mg/dL (ref 0.61–1.24)
GFR calc Af Amer: >60 mL/min (ref >60)
GFR calc non Af Amer: >60 mL/min (ref >60)
Glucose, Bld: 204 mg/dL — ABNORMAL HIGH (ref 70–99)
Potassium: 3.1 mmol/L — ABNORMAL LOW (ref 3.5–5.1)
Sodium: 136 mmol/L (ref 135–145)

## 2020-05-19 LAB — ECHOCARDIOGRAM COMPLETE
Height: 65 in
S' Lateral: 2.76 cm
Weight: 2511.48 oz

## 2020-05-19 LAB — HIV ANTIBODY (ROUTINE TESTING W REFLEX): HIV Screen 4th Generation wRfx: NONREACTIVE

## 2020-05-19 MED ORDER — POTASSIUM CHLORIDE CRYS ER 20 MEQ PO TBCR
40.0000 meq | EXTENDED_RELEASE_TABLET | ORAL | Status: AC
  Administered 2020-05-19: 11:00:00 40 meq via ORAL
  Filled 2020-05-19: qty 2

## 2020-05-19 MED ORDER — CEFAZOLIN SODIUM-DEXTROSE 2-4 GM/100ML-% IV SOLN
2.0000 g | Freq: Three times a day (TID) | INTRAVENOUS | Status: DC
  Administered 2020-05-19 – 2020-05-22 (×11): 2 g via INTRAVENOUS
  Filled 2020-05-19 (×15): qty 100

## 2020-05-19 MED ORDER — UMECLIDINIUM BROMIDE 62.5 MCG/INH IN AEPB
1.0000 | INHALATION_SPRAY | Freq: Every day | RESPIRATORY_TRACT | Status: DC
  Administered 2020-05-20 – 2020-05-29 (×9): 1 via RESPIRATORY_TRACT
  Filled 2020-05-19: qty 7

## 2020-05-19 MED ORDER — MOMETASONE FURO-FORMOTEROL FUM 100-5 MCG/ACT IN AERO
2.0000 | INHALATION_SPRAY | Freq: Two times a day (BID) | RESPIRATORY_TRACT | Status: DC
  Administered 2020-05-19 – 2020-05-29 (×16): 2 via RESPIRATORY_TRACT
  Filled 2020-05-19 (×2): qty 8.8

## 2020-05-19 NOTE — Assessment & Plan Note (Signed)
continue statin

## 2020-05-19 NOTE — Assessment & Plan Note (Addendum)
--  Stable.  Will decrease long-acting insulin tonight in anticipation of n.p.o. status tomorrow.

## 2020-05-19 NOTE — Evaluation (Signed)
Physical Therapy Evaluation Patient Details Name: Jonathan [name] MRN: [id number] DOB: [birth year] Today's Date: 05/19/2020   History of Present Illness  Pt is a 79 y.o. 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  He tested positive for SARS-CoV-2 and received Regeneron and was discharged home.  MD assessment now includes: COPD with acute exacerbation, deconditioning, and Covid-19.    Clinical Impression  Pt was confused and somewhat agitated at times during the session but with extra time, cuing, and encouragement was able to participate.  Pt required physical assistance with all mobility tasks along with extra time and effort.  Pt was generally unsteady in standing and was only able to take several small, shuffling steps near the EOB before returning to sitting.  Pt's SpO2 and HR were WNL during the session with no c/o adverse symptoms.  Pt will benefit from PT services in a SNF setting upon discharge to safely address deficits listed in patient problem list for decreased caregiver assistance and eventual return to PLOF.      Follow Up Recommendations SNF    Equipment Recommendations  Other (comment) (Unable to determine secondary to pt cognition)    Recommendations for Other Services       Precautions / Restrictions Precautions Precautions: Fall Restrictions Weight Bearing Restrictions: No      Mobility  Bed Mobility Overal bed mobility: Needs Assistance Bed Mobility: Supine to Sit;Sit to Supine     Supine to sit: Mod assist;HOB elevated Sit to supine: Min assist   General bed mobility comments: Min to mod A for BLE and trunk control most notably with sup to sit  Transfers Overall transfer level: Needs assistance Equipment used: Rolling walker (2 wheeled) Transfers: Sit to/from Stand Sit to Stand: Min assist;From elevated surface         General transfer  comment: Min A to come to full upright standing with max verbal and tactile cues for sequencing  Ambulation/Gait Ambulation/Gait assistance: Min assist Gait Distance (Feet): 1 Feet Assistive device: Rolling walker (2 wheeled) Gait Pattern/deviations: Step-to pattern;Trunk flexed;Shuffle Gait velocity: decreased   General Gait Details: Pt agitated and generally refusing to attempt ambulation stating "this is pointless" but did take 2-3 small, effortful, shuffle steps near the EOB  Stairs            Wheelchair Mobility    Modified Rankin (Stroke Patients Only)       Balance Overall balance assessment: Needs assistance Sitting-balance support: Feet supported;Bilateral upper extremity supported Sitting balance-Leahy Scale: Fair     Standing balance support: Bilateral upper extremity supported;During functional activity Standing balance-Leahy Scale: Poor Standing balance comment: Min A for stability in standing                             Pertinent Vitals/Pain Pain Assessment: No/denies pain    Home Living Family/patient expects to be discharged to:: Private residence Living Arrangements: Alone Available Help at Discharge: Family;Available PRN/intermittently             Additional Comments: Unable to obtain detailed history secondary to pt confusion and agitation; pt stated lived alone "here" in the hospital and has a daughter that he sees occasionally, no further details able to be obtained    Prior Function           Comments: PLOF unclear with patient unable to provide secondary  to agitation and decreased cognition     Hand Dominance        Extremity/Trunk Assessment   Upper Extremity Assessment Upper Extremity Assessment: Generalized weakness    Lower Extremity Assessment Lower Extremity Assessment: Generalized weakness       Communication      Cognition Arousal/Alertness: Awake/alert Behavior During Therapy:  Restless;Agitated Overall Cognitive Status: No family/caregiver present to determine baseline cognitive functioning                                 General Comments: Pt stated that he lives "here" in the hospital; agitated with difficulty following commands      General Comments      Exercises Total Joint Exercises Heel Slides: AROM;Both;5 reps Hip ABduction/ADduction: AAROM;Both;5 reps Straight Leg Raises: AAROM;Both;5 reps Long Arc Quad: AROM;Strengthening;Both;10 reps Knee Flexion: AROM;Strengthening;Both;10 reps Other Exercises Other Exercises: Static sitting unsupported at the EOB for increased core strength and improved activity tolerance   Assessment/Plan    PT Assessment Patient needs continued PT services  PT Problem List Decreased strength;Decreased activity tolerance;Decreased balance;Decreased mobility;Decreased knowledge of use of DME;Decreased safety awareness       PT Treatment Interventions DME instruction;Gait training;Functional mobility training;Therapeutic activities;Stair training;Therapeutic exercise;Balance training;Patient/family education    PT Goals (Current goals can be found in the Care Plan section)  Acute Rehab PT Goals PT Goal Formulation: Patient unable to participate in goal setting Time For Goal Achievement: 06/01/20 Potential to Achieve Goals: Fair    Frequency Min 2X/week   Barriers to discharge        Co-evaluation               AM-PAC PT "6 Clicks" Mobility  Outcome Measure Help needed turning from your back to your side while in a flat bed without using bedrails?: A Little Help needed moving from lying on your back to sitting on the side of a flat bed without using bedrails?: A Little Help needed moving to and from a bed to a chair (including a wheelchair)?: A Little Help needed standing up from a chair using your arms (e.g., wheelchair or bedside chair)?: A Little Help needed to walk in hospital room?: A  Lot Help needed climbing 3-5 steps with a railing? : Total 6 Click Score: 15    End of Session Equipment Utilized During Treatment: Gait belt;Oxygen Activity Tolerance: Treatment limited secondary to agitation Patient left: in bed;with call bell/phone within reach;with bed alarm set;Other (comment) (Pt declined up in chair) Nurse Communication: Mobility status PT Visit Diagnosis: Unsteadiness on feet (R26.81);Difficulty in walking, not elsewhere classified (R26.2);Muscle weakness (generalized) (M62.81)    Time: 0962-8366 PT Time Calculation (min) (ACUTE ONLY): 39 min   Charges:   PT Evaluation $PT Eval Moderate Complexity: 1 Mod PT Treatments $Therapeutic Exercise: 8-22 mins        D. Royetta Asal PT, DPT 05/19/20, 1:47 PM

## 2020-05-19 NOTE — Assessment & Plan Note (Addendum)
--  remains afebrile, vital signs stable. --sepsis ruled out --Source remains unclear.  Remote neck surgery.  No other hardware.  No drug use.  Skin appears unremarkable except for distal left ring finger, which has circumferential erythema. Tender but no abscess or lesions. Xray probably nonacute.  --We will continue cefazolin.  Repeat cultures no growth to date.  Infectious disease following, appreciated. --2D echocardiogram unrevealing.  TEE planned 8/25.

## 2020-05-19 NOTE — Progress Notes (Signed)
PROGRESS NOTE  Jonathan [name] HUT:[id number] DOB: 1941/05/17 DOA: 05/18/2020 PCP: Abner Greenspan, MD  Brief History   79 year old male who was recently discharged from the hospital after treatment for COVID-19 viral infection.  Presented to ED for evaluation of worsening shortness of breath, nonproductive cough and wheezing and was found to be hypoxic with room air pulse oximetry at rest of 86%.  Admitted for COPD exacerbation in the context of recent Covid infection.  . Admit 8/13 to 8/16 for COVID tx w/ remdesivir, steroids. Discharged on steroid taper and compelte outpt remdesivir.  . 8/13 ED, hypoxic, admitted for COVID . 8/12 ED, COVID+, received MAB and was d/c from ED   A & P  Bacteremia due to Staphylococcus aureus --Afebrile, vital signs stable --Source unclear.  Remote neck surgery.  No other hardware.  No drug use.  Skin appears unremarkable. --Continue cefazolin.  Follow-up culture results and sensitivities.  Infectious disease consultation 8/23. --2D echocardiogram unrevealing.  Likely TEE next week.  COPD with acute exacerbation ([place]) --Appears resolved at this point.  Reports breathing appears to be at baseline.  On oxygen at home. --Change to oral steroids.  Continue bronchodilators.  Continue supplemental oxygen.  COVID-19 virus infection --Status post treatment last hospitalization with remdesivir and steroids.  Appears asymptomatic at this time.  Also received monoclonal antibody infusion 8/12. --Monitor clinically.  Type 2 diabetes mellitus with hyperglycemia, with long-term current use of insulin (HCC) --Stable.  Continue current management with Lantus, sliding scale insulin  Closed rib fracture --Etiology unclear.  History unreliable.  Reports some pain.  Minimally displaced right anterolateral seventh rib fracture, with small surrounding hematoma. No evidence of hemothorax or pneumothorax.  Monitor clinically.  Lung mass --1.4 cm spiculated right lower lobe  mass, highly concerning for bronchogenic malignancy. PET-CT is recommended for further Evaluation. --f/u as outpt  Aortic atherosclerosis ([place]) --continue statin  Emphysema lung ([place]) --as per COPD  Chronic respiratory failure with hypoxia (HCC) --stable, continue current care   Disposition Plan:  Discussion: Clinically appears better.  Continue treatment as above.  Status is: Inpatient  Remains inpatient appropriate because:IV treatments appropriate due to intensity of illness or inability to take PO   Dispo: The patient is from: Home              Anticipated d/c is to: Home              Anticipated d/c date is: 3 days              Patient currently is not medically stable to d/c.  DVT prophylaxis: enoxaparin (LOVENOX) injection 40 mg Start: 05/18/20 1630  Code Status: Full Family Communication:  none  Murray Hodgkins, MD  [facility] Direct contact: see [email or web address] (further directions at bottom of note if needed) 7PM-7AM contact night coverage as at bottom of note 05/19/2020, 6:08 PM  LOS: 1 day   Significant Hospital Events   . 8/20 admit   Consults:  . ID 8/23   Procedures:  .   Significant Diagnostic Tests:  [name]    Micro Data:  . 8/20 BC > GPC ID MSSA    Antimicrobials:  . Cefazolin 8/2 >  Interval History/Subjective  Feels ok, breathing fine, only pain is right rib cage.  When asked does say he has left shoulder pain Doesn't remember falling.  RN has noted confusion.  Objective   Vitals:  Vitals:   05/19/20 1200 05/19/20 1625  BP: (!) 142/52 131/65  Pulse: 88  99  Resp: 20 (!) 23  Temp: (!) 97.2 F (36.2 C) 97.9 F (36.6 C)  SpO2: 99% 95%    Exam:  Constitutional:   . Appears calm and comfortable ENMT:  . grossly normal hearing  Respiratory:  . CTA bilaterally, no w/r/r.  . Respiratory effort normal. Cardiovascular:  . RRR, no m/r/g . No LE extremity edema   Abdomen:  . Soft ntnd Musculoskeletal:  . RUE, LUE, RLE, LLE    o strength and tone normal, no atrophy, no abnormal movements o No tenderness, masses . Left shoulder is nontender and appears unremarkable. Range of movement appears intact. . There is some pain over the right rib cage Skin:  . No rashes, lesions, ulcers noted of significance . palpation of skin: no induration or nodules Psychiatric:  . Mental status o Mood, affect appropriate o Oriented to self location . confused    I have personally reviewed the following:   Today's Data  . CBG noted . K+ 3.1 . WBC 29.1  Scheduled Meds: . aspirin EC  81 mg Oral Daily  . atorvastatin  10 mg Oral Daily  . enoxaparin (LOVENOX) injection  40 mg Subcutaneous Q24H  . insulin aspart  0-15 Units Subcutaneous TID WC  . insulin detemir  20 Units Subcutaneous QHS  . mometasone-formoterol  2 puff Inhalation BID   And  . umeclidinium bromide  1 puff Inhalation Daily  . nicotine  21 mg Transdermal Daily  . predniSONE  40 mg Oral Q breakfast   Continuous Infusions: . sodium chloride 250 mL (05/19/20 0507)  .  ceFAZolin (ANCEF) IV 2 g (05/19/20 1429)  . famotidine (PEPCID) IV      Principal Problem:   Bacteremia due to Staphylococcus aureus Active Problems:   COPD with acute exacerbation (HCC)   Lung mass   Chronic respiratory failure with hypoxia ([place])   COVID-19 virus infection   Tobacco abuse   Physical deconditioning   Type 2 diabetes mellitus with hyperglycemia, with long-term current use of insulin (HCC)   Closed rib fracture   Aortic atherosclerosis ([place])   Emphysema lung ([place])   LOS: 1 day   How to contact the [facility] Attending or Consulting provider 7A - 7P or covering provider during after hours [place], for this patient?  1. Check the care team in [facility] and look for a) attending/consulting TR[facility] provider listed and b) the [facility] team listed 2. Log into [email or web address] and use [place] universal password to access. If you do not have the password, please contact the hospital  operator. 3. Locate the [facility] provider you are looking for under [facility] and page to a number that you can be directly reached. 4. If you still have difficulty reaching the provider, please page the [facility] (Director on Call) for the Hospitalists listed on amion for assistance.

## 2020-05-19 NOTE — Progress Notes (Signed)
*  PRELIMINARY RESULTS* Echocardiogram 2D Echocardiogram has been performed.  Jonathan [name] 05/19/2020, 9:55 AM

## 2020-05-19 NOTE — Assessment & Plan Note (Addendum)
--  1.4 cm spiculated right lower lobe mass, highly concerning for bronchogenic malignancy. PET-CT is recommended for further Evaluation. --f/u as outpt -- I discussed w/ wife and daughter by telephone 8/22

## 2020-05-19 NOTE — Assessment & Plan Note (Addendum)
--  Status post treatment last hospitalization with remdesivir and steroids.  Appears asymptomatic at this time.  Also received monoclonal antibody infusion 8/12. --Asymptomatic.

## 2020-05-19 NOTE — Assessment & Plan Note (Signed)
--  as per COPD

## 2020-05-19 NOTE — Progress Notes (Signed)
Pharmacy Antibiotic Note  Jonathan [name] is a 79 y.o. male admitted on 05/18/2020 with bacteremia.  Pharmacy has been consulted for Ancef dosing.  Plan: Ancef 2gm IV q8hrs  Height: 5\' 5"  (165.1 cm) Weight: 71.2 kg (156 lb 15.5 oz) IBW/kg (Calculated) : 61.5  Temp (24hrs), Avg:97.8 F (36.6 C), Min:97.7 F (36.5 C), Max:97.8 F (36.6 C)  Recent Labs  Lab 05/12/20 0437 05/13/20 0500 05/14/20 0422 05/18/20 1220 05/18/20 1644  WBC 10.5 15.3* 12.4* 24.2*  --   CREATININE 0.76 0.83 0.94 1.05  --   LATICACIDVEN  --   --   --  2.6* 2.2*    Estimated Creatinine Clearance: 49.6 mL/min (by C-G formula based on SCr of 1.05 mg/dL).    Allergies  Allergen Reactions  . Amoxicillin-Pot Clavulanate Other (See Comments)    Severe stomach pain.   . Quinapril Hcl Other (See Comments)    back pain  . Valsartan Other (See Comments)    back pain  . Varenicline Tartrate Other (See Comments)    AMS, hallucinations, night mares    Antimicrobials this admission:   >>    >>   Dose adjustments this admission:   Microbiology results:  BCx:   UCx:    Sputum:    MRSA PCR:   Thank you for allowing pharmacy to be a part of this patient's care.  Hart Robinsons A 05/19/2020 1:28 AM

## 2020-05-19 NOTE — Assessment & Plan Note (Addendum)
--  resolved. On oxygen at home. --Continue bronchodilators.  Continue supplemental oxygen.

## 2020-05-19 NOTE — Assessment & Plan Note (Addendum)
--  Minimally displaced right anterolateral seventh rib fracture, with small surrounding hematoma. No evidence of hemothorax or pneumothorax.  Monitor clinically. --secondary to fall at home

## 2020-05-19 NOTE — Progress Notes (Signed)
PHARMACY - PHYSICIAN COMMUNICATION CRITICAL VALUE ALERT - BLOOD CULTURE IDENTIFICATION (BCID)  Jonathan [name] is an 79 y.o. male who presented to [facility] on 05/18/2020 with a chief complaint of hypoxia.  Assessment:  Lab reports 4/4 bottles + for GPC, staph species, MecA not detected  Name of physician (or Provider) Contacted: [name], NP  Current antibiotics: Rocephin, Zithromax  Changes to prescribed antibiotics recommended: change to Cefazolin per protocol Recommendations accepted by provider  Results for orders placed or performed during the hospital encounter of 05/18/20  Blood Culture ID Panel (Reflexed) (Collected: 05/18/2020 12:27 PM)  Result Value Ref Range   Enterococcus faecalis NOT DETECTED NOT DETECTED   Enterococcus Faecium NOT DETECTED NOT DETECTED   Listeria monocytogenes NOT DETECTED NOT DETECTED   Staphylococcus species DETECTED (A) NOT DETECTED   Staphylococcus aureus (BCID) DETECTED (A) NOT DETECTED   Staphylococcus epidermidis NOT DETECTED NOT DETECTED   Staphylococcus lugdunensis NOT DETECTED NOT DETECTED   Streptococcus species NOT DETECTED NOT DETECTED   Streptococcus agalactiae NOT DETECTED NOT DETECTED   Streptococcus pneumoniae NOT DETECTED NOT DETECTED   Streptococcus pyogenes NOT DETECTED NOT DETECTED   A.calcoaceticus-baumannii NOT DETECTED NOT DETECTED   Bacteroides fragilis NOT DETECTED NOT DETECTED   Enterobacterales NOT DETECTED NOT DETECTED   Enterobacter cloacae complex NOT DETECTED NOT DETECTED   Escherichia coli NOT DETECTED NOT DETECTED   Klebsiella aerogenes NOT DETECTED NOT DETECTED   Klebsiella oxytoca NOT DETECTED NOT DETECTED   Klebsiella pneumoniae NOT DETECTED NOT DETECTED   Proteus species NOT DETECTED NOT DETECTED   Salmonella species NOT DETECTED NOT DETECTED   Serratia marcescens NOT DETECTED NOT DETECTED   Haemophilus influenzae NOT DETECTED NOT DETECTED   Neisseria meningitidis NOT DETECTED NOT DETECTED   Pseudomonas  aeruginosa NOT DETECTED NOT DETECTED   Stenotrophomonas maltophilia NOT DETECTED NOT DETECTED   Candida albicans NOT DETECTED NOT DETECTED   Candida auris NOT DETECTED NOT DETECTED   Candida glabrata NOT DETECTED NOT DETECTED   Candida krusei NOT DETECTED NOT DETECTED   Candida parapsilosis NOT DETECTED NOT DETECTED   Candida tropicalis NOT DETECTED NOT DETECTED   Cryptococcus neoformans/gattii NOT DETECTED NOT DETECTED   Meth resistant mecA/C and MREJ NOT DETECTED NOT DETECTED    Hart Robinsons A 05/19/2020  1:08 AM

## 2020-05-19 NOTE — Evaluation (Signed)
Occupational Therapy Evaluation Patient Details Name: Jonathan [name] MRN: [id number] DOB: [birth year] Today's Date: 05/19/2020    History of Present Illness Pt is a 79 y.o. 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  He tested positive for SARS-CoV-2 and received Regeneron and was discharged home.  MD assessment now includes: COPD with acute exacerbation, deconditioning, and Covid-19.   Clinical Impression   Jonathan [name] was seen for OT evaluation this date. Pt is poor historian, reluctant to answer many questions. Reports independence c ADLs, assist from wife for IADLs, unclear mobility baseline. Pt is A&O x4 however makes inconsistent reports and appears intermittently confused (see Cognition below). Pt presents to acute OT demonstrating impaired ADL performance and functional mobility 2/2 decreased safety awareness, functional strength/ROM/balance deficits, and decreased activity tolerance. Pt currently requires SETUP self-drinking at bed level. MOD I doff B socks at bed level - increased time and VCs for technique. Pt attempted sup>sit using B rails and MOD A from OT however pt aborted attempt and requested bed control use to achieve upright posture for self-drinking - pt deferred all further mobility. Pt would benefit from skilled OT to address noted impairments and functional limitations (see below for any additional details) in order to maximize safety and independence while minimizing falls risk and caregiver burden. Upon hospital discharge, recommend STR to maximize pt safety and return to PLOF.     Follow Up Recommendations  SNF    Equipment Recommendations   (TBD)    Recommendations for Other Services       Precautions / Restrictions Precautions Precautions: Fall Restrictions Weight Bearing Restrictions: No      Mobility Bed Mobility Overal bed mobility: Needs  Assistance   General bed mobility comments: Pt attempted sup>sit using B rails and MOD A fro OT however pt aborted attempt and requested bed control use to achieve upright posture for self-drinking - pt deferred all further mobility   Transfers     General transfer comment: Not tested    Balance Overall balance assessment: Needs assistance      ADL either performed or assessed with clinical judgement   ADL Overall ADL's : Needs assistance/impaired        General ADL Comments: SETUP self-drinking at bed level. MOD I doff B socks at bed level - increased time and VCs for technique.      Pertinent Vitals/Pain Pain Assessment: No/denies pain     Hand Dominance Right   Extremity/Trunk Assessment Upper Extremity Assessment Upper Extremity Assessment: LUE deficits/detail;Generalized weakness LUE Deficits / Details: Reports bone spur in LU, shoulder flexion AROM ~90*   Lower Extremity Assessment Lower Extremity Assessment: Generalized weakness       Communication Communication Communication: No difficulties   Cognition Arousal/Alertness: Awake/alert Behavior During Therapy: Agitated Overall Cognitive Status: No family/caregiver present to determine baseline cognitive functioning    General Comments: Pt A&O x4 - however still sppears mildly confused (states location as hospital but unable to name, states time as 9pm (suspect difficulty reading clock) and unsure how long he has been here but knows current president, states reason for admit "they say covid but we don't know how it happened."   General Comments       Exercises Exercises: Other exercises Other Exercises Other Exercises: Pt educated re: OT role, importance of mobility for functional strengthening, falls prevention Other Exercises: LBD, self-drinking, bed mobility   Shoulder Instructions  Home Living Family/patient expects to be discharged to:: Private residence Living Arrangements:  Children;Spouse/significant other Available Help at Discharge: Family;Available PRN/intermittently Type of Home: Mobile home Home Access: Stairs to enter Entrance Stairs-Number of Steps: 7   Home Layout: One level    Additional Comments: Pt poor historian, reprts "regular shower" and endorses use of AD (unclear which type or how fq). Reports his child and his "old lady" live with him      Prior Functioning/Environment      Comments: Difficult to determine - states "sometimes" uses walker. Reports Wife does IADLs, states he needs no help to dress/bathe        OT Problem List: Decreased strength;Decreased activity tolerance;Impaired balance (sitting and/or standing);Decreased safety awareness      OT Treatment/Interventions: Self-care/ADL training;Therapeutic exercise;Energy conservation;DME and/or AE instruction;Therapeutic activities;Cognitive remediation/compensation;Patient/family education;Balance training    OT Goals(Current goals can be found in the care plan section) Acute Rehab OT Goals Patient Stated Goal: To return home OT Goal Formulation: With patient Time For Goal Achievement: 06/02/20 Potential to Achieve Goals: Fair ADL Goals Pt Will Perform Eating: with modified independence;sitting Pt Will Perform Lower Body Dressing: with min guard assist;sit to/from stand (c LRAD PRN) Pt Will Transfer to Toilet: with min assist;stand pivot transfer;bedside commode (c LRAD PRN)  OT Frequency: Min 1X/week   Barriers to D/C: Inaccessible home environment          AM-PAC OT "6 Clicks" Daily Activity     Outcome Measure Help from another person eating meals?: None Help from another person taking care of personal grooming?: A Little Help from another person toileting, which includes using toliet, bedpan, or urinal?: A Lot Help from another person bathing (including washing, rinsing, drying)?: A Lot Help from another person to put on and taking off regular upper body  clothing?: A Little Help from another person to put on and taking off regular lower body clothing?: A Lot 6 Click Score: 16   End of Session    Activity Tolerance: Patient limited by fatigue Patient left: in bed;with call bell/phone within reach;with bed alarm set  OT Visit Diagnosis: Other abnormalities of gait and mobility (R26.89);Muscle weakness (generalized) (M62.81)                Time: 1341-1400 OT Time Calculation (min): 19 min Charges:  OT General Charges $OT Visit: 1 Visit OT Evaluation $OT Eval Low Complexity: 1 Low OT Treatments $Self Care/Home Management : 8-22 mins  Dessie Coma, M.S. OTR/L  05/19/20, 3:24 PM  ascom [phone]

## 2020-05-19 NOTE — Assessment & Plan Note (Addendum)
--  remains stable , continue current care

## 2020-05-19 NOTE — Hospital Course (Addendum)
79 year old male who was recently discharged from the hospital after treatment for COVID-19 viral infection.  Presented to ED for evaluation of worsening shortness of breath, nonproductive cough and wheezing and was found to be hypoxic with room air pulse oximetry at rest of 86%.  Admitted for COPD exacerbation in the context of recent Covid infection.  COPD exacerbation resolved overnight.  Was found to have MSSA bacteremia which has become the dominant reason for ongoing hospitalization.  Initially refused TEE 8/23, but now agreeable to TEE 8/25.  Source of bacteremia unclear, could be phenomenon related to Covid.  Developed spontaneous left ring finger erythema distally of unclear significance.  Orthopedics following.  Seen by orthopedics, infectious disease and vascular surgery.  Plan for angiogram 8/25 to exclude vascular pathology.  Admit 8/13 to 8/16 for COVID tx w/ remdesivir, steroids. Discharged on steroid taper and compelte outpt remdesivir.  8/13 ED, hypoxic, admitted for COVID 8/12 ED, COVID+, received MAB and was d/c from ED

## 2020-05-20 ENCOUNTER — Inpatient Hospital Stay: Payer: Medicare Other

## 2020-05-20 DIAGNOSIS — M79645 Pain in left finger(s): Secondary | ICD-10-CM

## 2020-05-20 LAB — CBC
HCT: 47.1 % (ref 39.0–52.0)
Hemoglobin: 16.5 g/dL (ref 13.0–17.0)
MCH: 29.9 pg (ref 26.0–34.0)
MCHC: 35 g/dL (ref 30.0–36.0)
MCV: 85.3 fL (ref 80.0–100.0)
Platelets: 305 10*3/uL (ref 150–400)
RBC: 5.52 MIL/uL (ref 4.22–5.81)
RDW: 12.9 % (ref 11.5–15.5)
WBC: 28.5 10*3/uL — ABNORMAL HIGH (ref 4.0–10.5)
nRBC: 0 % (ref 0.0–0.2)

## 2020-05-20 LAB — MAGNESIUM: Magnesium: 2.1 mg/dL (ref 1.7–2.4)

## 2020-05-20 LAB — GLUCOSE, CAPILLARY
Glucose-Capillary: 255 mg/dL — ABNORMAL HIGH (ref 70–99)
Glucose-Capillary: 270 mg/dL — ABNORMAL HIGH (ref 70–99)
Glucose-Capillary: 312 mg/dL — ABNORMAL HIGH (ref 70–99)
Glucose-Capillary: 258 mg/dL — ABNORMAL HIGH (ref 70–99)

## 2020-05-20 MED ORDER — INSULIN DETEMIR 100 UNIT/ML ~~LOC~~ SOLN
40.0000 [IU] | Freq: Every day | SUBCUTANEOUS | Status: DC
  Administered 2020-05-20: 23:00:00 40 [IU] via SUBCUTANEOUS
  Filled 2020-05-20 (×2): qty 0.4

## 2020-05-20 NOTE — Assessment & Plan Note (Addendum)
--  xray nonacute, etiology unclear; may be Covid phenomenon,  --Appreciate orthopedic evaluation.  Will continue to follow.  Etiology unclear. --Appreciate vascular surgery evaluation, plan for angiogram tomorrow to exclude underlying vascular pathology.

## 2020-05-20 NOTE — Evaluation (Addendum)
Clinical/Bedside Swallow Evaluation Patient Details  Name: Jonathan [name] MRN: [id number] Date of Birth: 01/12/1941  Today's Date: 05/20/2020 Time: SLP Start Time (ACUTE ONLY): 1205 SLP Stop Time (ACUTE ONLY): 1305 SLP Time Calculation (min) (ACUTE ONLY): 60 min  Past Medical History:  Past Medical History:  Diagnosis Date  . Allergic rhinitis   . Aortic atherosclerosis ([place]) 05/19/2020  . Asthma   . COPD (chronic obstructive pulmonary disease) ([place])   . Dermatitis seborrheica   . Diabetes mellitus type II   . ED (erectile dysfunction)   . Emphysema lung ([place]) 05/19/2020  . Frozen shoulder   . HLD (hyperlipidemia)   . HTN (hypertension)   . Hyperkalemia   . Kidney stone   . Other diseases of lung, not elsewhere classified   . Pulmonary nodule, right    lower lobe  . Rotator cuff injury    right  . Shoulder pain   . Stroke [facility])    pt states "last year"  . Tobacco abuse    Past Surgical History:  Past Surgical History:  Procedure Laterality Date  . CERVICAL DISCECTOMY  1998  . EEG    . ETT  11/1994  . TEE WITHOUT CARDIOVERSION  12/03/2011   Procedure: TRANSESOPHAGEAL ECHOCARDIOGRAM (TEE);  Surgeon: Lelon Perla, MD;  Location: [facility] ENDOSCOPY;  Service: Cardiovascular;  Laterality: N/A;  . TM repair     HPI:  Pt is a 79 y.o. 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  He tested positive for SARS-CoV-2 and received Regeneron and was discharged home.  He returned to the emergency room because of recurrent falls and at that time was found to be hypoxic with pulse oximetry of 88% on room air.  Chest x-ray was consistent with his known history of COPD.  He was admitted to the hospital and was treated with remdesivir, Solu-Medrol and supportive care.  He was weaned off oxygen and was said to have ambulated on room air without any further episodes of hypoxia.  He was  discharged home on a steroid taper and advised to complete his remdesivir dose as an outpatient.  Chest x-ray reviewed by me shows mild interstitial thickening bilaterally, probably indicative of a degree of chronic bronchitis. No edema or airspace opacity.  MD is treating pt for Bacteremia d/t Staph.  Unsure of pt's Baseline Cognitive status - no recent brain imaging but 2015 scan revealed diffuse white matter changes and atrophy then.   Assessment / Plan / Recommendation Clinical Impression  Pt appears to present w/ adequate oropharyngeal phase swallow w/ No overt oropharyngeal phase dysphagia noted, No neuromuscular deficits noted.Pt consumed po trials w/ No overt, clinical s/s of aspiration during po trials. Pt appears at reduced risk for aspiration when following general aspiration precautions. Staph.  Unsure of pt's Baseline Cognitive status - no recent brain imaging but 2015 scan revealed diffuse white matter changes and atrophy then. Any Cognitive decline incluing easy to distract can impact focus on swallowing thus increase risk for an aspiration event. Pt described he "drank too much to finish and had to breathe at the same time"; he also endorsed getting "choked in the shower the other day". During po trials, pt consumed all consistencies w/ no overt coughing, decline in vocal quality, or change in respiratory presentation during/post trials. Pt was instructed to use Single, Smaller sips more Slowly. He was also sitting FULLY upright in  bed which "not comfortable for my butt". Pt was also instructed on NOT TALKING W/ FOODS/LIQUIDS IN HIS MOUTH. Oral phase appeared grossly [facility] w/ timely bolus management, mastication, and control of bolus propulsion for A-P transfer for swallowing. Pt had to use more anterior mastication of increased texture d/t missing his Molars. Suspect if foods aren't chewed sufficiently, it could be a choking hazard of attempting to swallow whole chunks of foods. Oral clearing  achieved w/ all trial consistencies. OM Exam appeared [facility] w/ no unilateral weakness noted. Speech Clear. Pt fed self w/ setup support. Recommend a Mech Soft consistency diet w/ MINCED meats, gravy, moistened foods; Thin liquidsVIA CUP. Recommend general aspiration precautions, Pills WHOLE in Puree for safer, easier swallowing. Education given on Pills in Puree; food consistencies and easy to eat options; general aspiration precautions. NSG to reconsult if any new needs arise. NSG agreed.  SLP Visit Diagnosis: Dysphagia, unspecified (R13.10)    Aspiration Risk   (reduced following precautions)    Diet Recommendation  Mech Soft diet w/ MEATS MINCED w/ gravies added to moisten foods; Thin liquids VIA CUP ONLY. General aspiration and Reflux precautions  Medication Administration: Whole meds with puree (for safer swallowing)    Other  Recommendations Recommended Consults:  (Dietician following) Oral Care Recommendations: Oral care BID;Oral care before and after PO;Patient independent with oral care Other Recommendations:  (n/a)   Follow up Recommendations None      Frequency and Duration  (n/a)   (n/a)       Prognosis Prognosis for Safe Diet Advancement: Fair (-Good) Barriers to Reach Goals:  (unsure of pt's baseline Cognitive status)      Swallow Study   General Date of Onset: 05/18/20 HPI: Pt is a 79 y.o. 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  He tested positive for SARS-CoV-2 and received Regeneron and was discharged home.  He returned to the emergency room because of recurrent falls and at that time was found to be hypoxic with pulse oximetry of 88% on room air.  Chest x-ray was consistent with his known history of COPD.  He was admitted to the hospital and was treated with remdesivir, Solu-Medrol and supportive care.  He was weaned off oxygen and was said to have  ambulated on room air without any further episodes of hypoxia.  He was discharged home on a steroid taper and advised to complete his remdesivir dose as an outpatient.  Chest x-ray reviewed by me shows mild interstitial thickening bilaterally, probably indicative of a degree of chronic bronchitis. No edema or airspace opacity.  MD is treating pt for Bacteremia d/t Staph.  Unsure of pt's Baseline Cognitive status - no recent brain imaging but 2015 scan revealed diffuse white matter changes and atrophy then. Type of Study: Bedside Swallow Evaluation Previous Swallow Assessment: none Diet Prior to this Study: NPO (regular diet at home) Temperature Spikes Noted: No (wbc 28.5) Respiratory Status: Nasal cannula (1-2L) History of Recent Intubation: No Behavior/Cognition: Alert;Cooperative;Pleasant mood;Confused;Distractible;Requires cueing Oral Cavity Assessment: Dry (min) Oral Care Completed by SLP: Yes Oral Cavity - Dentition: Missing dentition (no back teeth/molars) Vision: Functional for self-feeding Self-Feeding Abilities: Able to feed self;Needs set up;Needs assist Patient Positioning: Upright in bed (needed full positioning) Baseline Vocal Quality: Normal Volitional Cough: Strong Volitional Swallow: Able to elicit    Oral/Motor/Sensory Function Overall Oral Motor/Sensory Function: Within functional limits   Ice Chips Ice chips:  Within functional limits Presentation: Spoon (fed; 4 trials) Other Comments: often talked w/ trials in mouth   Thin Liquid Thin Liquid: Within functional limits Presentation: Cup;Self Fed (10 trials) Other Comments: one sip at a time, slowly; no talking    Nectar Thick Nectar Thick Liquid: Not tested   Honey Thick Honey Thick Liquid: Not tested   Puree Puree: Within functional limits Presentation: Spoon (fed; 10 trials) Other Comments: often talked   Solid     Solid: Impaired Presentation: Spoon (fed; 6 trials of graham cracker in puree) Oral Phase  Impairments: Impaired mastication (missing molars) Oral Phase Functional Implications: Impaired mastication (min increase times for anterior mastication) Pharyngeal Phase Impairments:  (none) Other Comments: often talked       Orinda Kenner, MS, CCC-SLP [employer] 05/20/2020,1:19 PM

## 2020-05-20 NOTE — Progress Notes (Signed)
Initial Nutrition Assessment  DOCUMENTATION CODES:   Not applicable  INTERVENTION:  Will monitor for diet advancement following SLP evaluation and make appropriate recommendations.  NUTRITION DIAGNOSIS:   Increased nutrient needs related to catabolic illness (AECOPD, COVID-19) as evidenced by estimated needs.  GOAL:   Patient will meet greater than or equal to 90% of their needs  MONITOR:   PO intake, Diet advancement, Labs, Weight trends, I & O's  REASON FOR ASSESSMENT:   Consult Assessment of nutrition requirement/status  ASSESSMENT:   79 year old male with PMHx of HTN, HLD, COPD, DM, asthma who was recently discharged after treatment for COVID-19 now admitted with bacteremia due to staphylococcus aureus, acute exacerbation of COPD, also with 1.4cm spiculated right lower lobe mass concerning for malignancy.   Attempted to call patient over the phone several times but call unable to go through. Per review of chart patient was noted choking after drinking his water this morning. He has now been made NPO and is pending SLP evaluation. Will monitor for SLP evaluation and diet advancement. Patient will benefit from oral nutrition supplement once diet is able to be advanced.  According to chart patient was 78 kg on 05/17/2019. He is now documented to be 71.2 kg (156.97 lbs). He has lost 6.8 kg (8.7% body weight) over the past year, which is not significant for time frame.  Medications reviewed and include: Novolog 0-15 units TID, Levemir 20 units QHS, prednisone 40 mg daily, cefazolin, famotidine.  Labs reviewed: CBG 146-255, Potassium 3.1, Chloride 92.  Unable to determine if patient meets criteria for malnutrition at this time.  NUTRITION - FOCUSED PHYSICAL EXAM:  Unable to complete as RD is working remotely.  Diet Order:   Diet Order            Diet NPO time specified Except for: Sips with Meds  Diet effective now                EDUCATION NEEDS:   No education  needs have been identified at this time  Skin:  Skin Assessment: Reviewed RN Assessment  Last BM:  Unknown  Height:   Ht Readings from Last 1 Encounters:  05/18/20 5\' 5"  (1.651 m)   Weight:   Wt Readings from Last 1 Encounters:  05/18/20 71.2 kg   BMI:  Body mass index is 26.12 kg/m.  Estimated Nutritional Needs:   Kcal:  1900-2100  Protein:  95-105 grams  Fluid:  1.8 L/day  Jacklynn Barnacle, MS, RD, LDN Pager number available on Amion

## 2020-05-20 NOTE — Consult Note (Addendum)
Date of Admission:  05/18/2020          Reason for Consult: MSSA bacteremia    Referring Provider: Olin Pia Consult   Assessment:  1. MSSA bacteremia without clear source 2. Recent COVID 19 infection 3. Spiculated lesion concerning for malignancy on CT of lungs 4. Hx of rotator cuff surgery and shoulder pain 5. Left finger pain 6. CAD 7. COPD 8. DM  Plan:  1. Continue cefazolin 2. Blood cultures need to be repeated to ensure clearance 3. DO NOT place central line until cultures clearing 4. He needs TEE 5. Would MRI L shoulder 6. Monitor finger, other potential sites of infection 7. Spiculated lung lesion will need to be further worked up  Case discussed with Dr. Sarajane Jews.  Dr. Delaine Lame to provide formal in person consult tomorrow.   Principal Problem:   Bacteremia due to Staphylococcus aureus Active Problems:   Tobacco abuse   Physical deconditioning   Type 2 diabetes mellitus with hyperglycemia, with long-term current use of insulin (HCC)   COVID-19 virus infection   COPD with acute exacerbation (HCC)   Closed rib fracture   Lung mass   Aortic atherosclerosis (HCC)   Emphysema lung (HCC)   Chronic respiratory failure with hypoxia (HCC)   Finger pain, left   Scheduled Meds: . aspirin EC  81 mg Oral Daily  . atorvastatin  10 mg Oral Daily  . enoxaparin (LOVENOX) injection  40 mg Subcutaneous Q24H  . insulin aspart  0-15 Units Subcutaneous TID WC  . insulin detemir  20 Units Subcutaneous QHS  . mometasone-formoterol  2 puff Inhalation BID   And  . umeclidinium bromide  1 puff Inhalation Daily  . nicotine  21 mg Transdermal Daily  . predniSONE  40 mg Oral Q breakfast   Continuous Infusions: . sodium chloride 250 mL (05/19/20 0507)  .  ceFAZolin (ANCEF) IV 2 g (05/20/20 1442)  . famotidine (PEPCID) IV     PRN Meds:.sodium chloride, albuterol, albuterol, diphenhydrAMINE, EPINEPHrine, famotidine (PEPCID) IV, oxyCODONE  HPI: Jonathan [name]  is a 79 y.o. male recently DC from the hospital after having suffered COVID 19 infection. He has hx of DM, CAD< COPD and was admitted with cough, wheezing and hypoxia. CT of the lungs showed spiculated lesion. Blood cultures taken during admission are growing MSSA. TTE did not show a vegetation. He has chronic left shoulder pain though this needs aggressive imaging given he is bacteremic. Apparently his finger on left side is erythematous. Plain films of shoulder and finger unrevealing. FInger may need more aggressive imaging as well.   Review of Systems: Review of Systems  Unable to perform ROS: Other    Past Medical History:  Diagnosis Date  . Allergic rhinitis   . Aortic atherosclerosis ([place]) 05/19/2020  . Asthma   . COPD (chronic obstructive pulmonary disease) ([place])   . Dermatitis seborrheica   . Diabetes mellitus type II   . ED (erectile dysfunction)   . Emphysema lung ([place]) 05/19/2020  . Frozen shoulder   . HLD (hyperlipidemia)   . HTN (hypertension)   . Hyperkalemia   . Kidney stone   . Other diseases of lung, not elsewhere classified   . Pulmonary nodule, right    lower lobe  . Rotator cuff injury    right  . Shoulder pain   . Stroke [facility])    pt states "last year"  . Tobacco abuse     Social History   Tobacco Use  .  Smoking status: Current Every Day Smoker    Packs/day: 1.00    Years: 65.00    Pack years: 65.00    Types: Cigarettes  . Smokeless tobacco: Never Used  Vaping Use  . Vaping Use: Never used  Substance Use Topics  . Alcohol use: No    Alcohol/week: 0.0 standard drinks    Comment: Quit 40 years ago  . Drug use: No    Family History  Problem Relation Age of Onset  . Asthma Sister   . Emphysema Sister   . Diabetes Mother   . Diabetes Brother   . Heart disease Brother    Allergies  Allergen Reactions  . Amoxicillin-Pot Clavulanate Other (See Comments)    Severe stomach pain.   . Quinapril Hcl Other (See Comments)    back pain  . Valsartan  Other (See Comments)    back pain  . Varenicline Tartrate Other (See Comments)    AMS, hallucinations, night mares    OBJECTIVE: Blood pressure (!) 121/59, pulse 86, temperature 97.6 F (36.4 C), temperature source Axillary, resp. rate 20, height 5\' 5"  (1.651 m), weight 71.2 kg, SpO2 97 %.  Physical Exam Vitals reviewed.     Lab Results Lab Results  Component Value Date   WBC 28.5 (H) 05/20/2020   HGB 16.5 05/20/2020   HCT 47.1 05/20/2020   MCV 85.3 05/20/2020   PLT 305 05/20/2020    Lab Results  Component Value Date   CREATININE 0.91 05/19/2020   BUN 23 05/19/2020   NA 136 05/19/2020   K 3.1 (L) 05/19/2020   CL 92 (L) 05/19/2020   CO2 31 05/19/2020    Lab Results  Component Value Date   ALT 20 05/18/2020   AST 24 05/18/2020   ALKPHOS 87 05/18/2020   BILITOT 2.0 (H) 05/18/2020     Microbiology: Recent Results (from the past 240 hour(s))  Blood culture (routine x 2)     Status: None   Collection Time: 05/11/20  6:11 AM   Specimen: BLOOD  Result Value Ref Range Status   Specimen Description   Final    BLOOD RIGHT ANTECUBITAL Performed at [facility], [place] [address], [place], [place] [address]    Special Requests   Final    BOTTLES DRAWN AEROBIC AND ANAEROBIC Blood Culture adequate volume Performed at [facility] [address] [place], [place] [address]    Culture   Final    NO GROWTH 5 DAYS Performed at [facility], [place] [address] [place], [place] [address]    Report Status 05/16/2020 FINAL  Final  Blood culture (routine x 2)     Status: None   Collection Time: 05/11/20  6:30 AM   Specimen: BLOOD  Result Value Ref Range Status   Specimen Description   Final    BLOOD LEFT ANTECUBITAL Performed at [facility] [address] [place], [place] [address]    Special Requests   Final    BOTTLES DRAWN AEROBIC AND ANAEROBIC Blood Culture adequate volume Performed at [facility] [address], [place], [place] [address]    Culture   Final    NO GROWTH 5 DAYS Performed at [facility], [place] [address], [place], [place] [address]    Report Status 05/16/2020 FINAL  Final  Blood Culture (routine x 2)     Status: Abnormal (Preliminary result)   Collection Time: 05/18/20 12:27 PM   Specimen: BLOOD  Result Value Ref Range Status   Specimen Description   Final    BLOOD BLOOD LEFT HAND Performed at [facility], [address] [place], [place] [address]    Special Requests   Final    BOTTLES DRAWN AEROBIC AND ANAEROBIC Blood Culture adequate volume Performed at [facility], [address] [place], [place] [address]    Culture  Setup Time   Final    Organism ID to follow [place] IN BOTH AEROBIC AND ANAEROBIC BOTTLES CRITICAL RESULT CALLED TO, READ BACK BY AND VERIFIED WITH: SCOTT HALL 05/19/20 AT 0010 HS Performed at [facility], [address] [place], [place] [address]    Culture STAPHYLOCOCCUS AUREUS (A)  Final   Report Status PENDING  Incomplete  Blood Culture (routine x 2)     Status: Abnormal (Preliminary result)   Collection Time: 05/18/20 12:27 PM   Specimen: BLOOD  Result Value Ref Range Status   Specimen Description   Final    BLOOD BLOOD LEFT FOREARM Performed at [facility], [address] [place], [place] [address]    Special Requests   Final    BOTTLES DRAWN AEROBIC AND ANAEROBIC Blood Culture adequate volume Performed at [facility], [address], [place], [place] [address]    Culture  Setup Time   Final    GRAM POSITIVE COCCI IN BOTH AEROBIC AND ANAEROBIC BOTTLES CRITICAL VALUE NOTED.  VALUE IS CONSISTENT WITH PREVIOUSLY REPORTED AND CALLED VALUE. Performed at [facility], [address] [place], [place] [address]    Culture STAPHYLOCOCCUS AUREUS (A)  Final   Report Status PENDING  Incomplete  Blood Culture ID Panel (Reflexed)     Status:  Abnormal   Collection Time: 05/18/20 12:27 PM  Result Value Ref Range Status   Enterococcus faecalis NOT DETECTED NOT DETECTED Final   Enterococcus Faecium NOT DETECTED NOT DETECTED Final   Listeria monocytogenes NOT DETECTED NOT DETECTED Final   Staphylococcus species DETECTED (A) NOT DETECTED Final    Comment: CRITICAL RESULT CALLED TO, READ BACK BY AND VERIFIED WITH: SCOTT HALL 05/19/20 AT 0010 HS    Staphylococcus aureus (BCID) DETECTED (A) NOT DETECTED Final    Comment: CRITICAL RESULT CALLED TO, READ BACK BY AND VERIFIED WITH: SCOTT HALL 05/19/20 AT 0010 HS    Staphylococcus epidermidis NOT DETECTED NOT DETECTED Final   Staphylococcus lugdunensis NOT DETECTED NOT DETECTED Final   Streptococcus species NOT DETECTED NOT DETECTED Final   Streptococcus agalactiae NOT DETECTED NOT DETECTED Final   Streptococcus pneumoniae NOT DETECTED NOT DETECTED Final   Streptococcus pyogenes NOT DETECTED NOT DETECTED Final   A.calcoaceticus-baumannii NOT DETECTED NOT DETECTED Final   Bacteroides fragilis NOT DETECTED NOT DETECTED Final   Enterobacterales NOT DETECTED NOT DETECTED Final   Enterobacter cloacae complex NOT DETECTED NOT DETECTED Final   Escherichia coli NOT DETECTED NOT DETECTED Final   Klebsiella aerogenes NOT DETECTED NOT DETECTED Final   Klebsiella oxytoca NOT DETECTED NOT DETECTED Final   Klebsiella pneumoniae NOT DETECTED NOT DETECTED Final   Proteus species NOT DETECTED NOT DETECTED Final   Salmonella species NOT DETECTED NOT DETECTED Final   Serratia marcescens NOT DETECTED NOT DETECTED Final   Haemophilus influenzae NOT DETECTED NOT DETECTED Final   Neisseria meningitidis NOT DETECTED NOT DETECTED Final   Pseudomonas aeruginosa NOT DETECTED NOT DETECTED Final   Stenotrophomonas maltophilia NOT DETECTED NOT DETECTED Final   Candida albicans NOT DETECTED NOT DETECTED Final   Candida auris  NOT DETECTED NOT DETECTED Final   Candida glabrata NOT DETECTED NOT DETECTED Final    Candida krusei NOT DETECTED NOT DETECTED Final   Candida parapsilosis NOT DETECTED NOT DETECTED Final   Candida tropicalis NOT DETECTED NOT DETECTED Final   Cryptococcus neoformans/gattii NOT DETECTED NOT DETECTED Final   Meth resistant mecA/C and MREJ NOT DETECTED NOT DETECTED Final    Comment: Performed at [facility], [address], [place], [place] [address]    Jonathan [name], [facility] [phone] pager  05/20/2020, 3:09 PM

## 2020-05-20 NOTE — Progress Notes (Signed)
PROGRESS NOTE  Jonathan [name] NLZ:[id number] DOB: 05-23-1941 DOA: 05/18/2020 PCP: Abner Greenspan, MD  Brief History   79 year old male who was recently discharged from the hospital after treatment for COVID-19 viral infection.  Presented to ED for evaluation of worsening shortness of breath, nonproductive cough and wheezing and was found to be hypoxic with room air pulse oximetry at rest of 86%.  Admitted for COPD exacerbation in the context of recent Covid infection.  . Admit 8/13 to 8/16 for COVID tx w/ remdesivir, steroids. Discharged on steroid taper and compelte outpt remdesivir.  . 8/13 ED, hypoxic, admitted for COVID . 8/12 ED, COVID+, received MAB and was d/c from ED   A & P  Bacteremia due to Staphylococcus aureus --remains afebril, vital signs stable --Source remains unclear.  Remote neck surgery.  No other hardware.  No drug use.  Skin appears unremarkable except for distal left ring finger, which has circumferential erythema. Tender but no abscess or lesions. Ddx infection, gout, fx. Will check xray and follow. Continue abx. --Continue cefazolin.  Follow-up culture results and sensitivities.  Infectious disease consultation 8/23. --2D echocardiogram unrevealing.  TEE next week.  COPD with acute exacerbation ([place]) --resolved. On oxygen at home. --Continue oral steroids.  Continue bronchodilators.  Continue supplemental oxygen.  COVID-19 virus infection --Status post treatment last hospitalization with remdesivir and steroids.  Appears asymptomatic at this time.  Also received monoclonal antibody infusion 8/12. --Monitor clinically.  Type 2 diabetes mellitus with hyperglycemia, with long-term current use of insulin (HCC) --labile.  Continue current management with Lantus, sliding scale insulin but if high tomorrow will increase Lantus or add meal coverage  Closed rib fracture --Etiology unclear.  History unreliable.  Reports some pain.  Minimally displaced right  anterolateral seventh rib fracture, with small surrounding hematoma. No evidence of hemothorax or pneumothorax.  Monitor clinically.  Lung mass --1.4 cm spiculated right lower lobe mass, highly concerning for bronchogenic malignancy. PET-CT is recommended for further Evaluation. --f/u as outpt  Aortic atherosclerosis ([place]) --continue statin  Emphysema lung ([place]) --as per COPD  Chronic respiratory failure with hypoxia (HCC) --remains stable , continue current care  Finger pain, left --check xray, continue abx, continue prednisone; follow clinically   Disposition Plan:  Discussion: Respiratory issues appear stable. MSSA bacteremia of unknown source.  Reported left shoulder pain on admission, imaging reveals longstanding chronic issues with the shoulder that would explain pain.  No signs or symptoms of infection he moved quite well yesterday.  We will investigate left ring finger erythema, check x-ray, continue antibiotics.  If worsens, will consult with orthopedics.  Status is: Inpatient  Remains inpatient appropriate because:IV treatments appropriate due to intensity of illness or inability to take PO  Dispo: The patient is from: Home              Anticipated d/c is to: Home              Anticipated d/c date is: 3 days              Patient currently is not medically stable to d/c.  DVT prophylaxis: enoxaparin (LOVENOX) injection 40 mg Start: 05/18/20 1630  Code Status: Full Family Communication: updated wife and daughter by telephone  Murray Hodgkins, MD  [facility] Direct contact: see [email or web address] (further directions at bottom of note if needed) 7PM-7AM contact night coverage as at bottom of note 05/20/2020, 5:08 PM  LOS: 2 days   Significant Hospital Events   . 8/20 admit  Consults:  . ID 8/23   Procedures:  .   Significant Diagnostic Tests:  [name]    Micro Data:  . 8/20 BC > GPC ID MSSA    Antimicrobials:  . Cefazolin 8/2 >  Interval History/Subjective    Confused at times per RN, other times appropriate Reports pain all over Left ringer pain for a few days Breathing fine  Objective   Vitals:  Vitals:   05/20/20 1104 05/20/20 1629  BP: (!) 121/59 (!) 116/52  Pulse: 86 94  Resp: 20 20  Temp: 97.6 F (36.4 C) 97.6 F (36.4 C)  SpO2: 97% 95%    Exam:  Constitutional:   . Appears calm and comfortable ENMT:  . grossly normal hearing  Respiratory:  . CTA bilaterally, no w/r/r.  . Respiratory effort normal.  Cardiovascular:  . RRR, no m/r/g . No LE extremity edema   Abdomen:  . Soft ntnd Musculoskeletal:  . Digits/nails BUE: left ring finger distal erythema, tenderness to touch, no wound, no lesions, no edema Skin:  . No rashes, lesions, ulcers except as above . Back, anus, perineum examined 8/21, unremarkabe . Scrotum, penis unremarkable Psychiatric:  . Mental status o Mood, affect appropriate . Confused as to location .  I have personally reviewed the following:   Today's Data  . CBG high, generally in 200s . Mg WNL . WBC no sig change, 28.5  Scheduled Meds: . aspirin EC  81 mg Oral Daily  . atorvastatin  10 mg Oral Daily  . enoxaparin (LOVENOX) injection  40 mg Subcutaneous Q24H  . insulin aspart  0-15 Units Subcutaneous TID WC  . insulin detemir  40 Units Subcutaneous QHS  . mometasone-formoterol  2 puff Inhalation BID   And  . umeclidinium bromide  1 puff Inhalation Daily  . nicotine  21 mg Transdermal Daily   Continuous Infusions: . sodium chloride 250 mL (05/19/20 0507)  .  ceFAZolin (ANCEF) IV 2 g (05/20/20 1442)  . famotidine (PEPCID) IV      Principal Problem:   Bacteremia due to Staphylococcus aureus Active Problems:   COPD with acute exacerbation (HCC)   Lung mass   Chronic respiratory failure with hypoxia (HCC)   Finger pain, left   COVID-19 virus infection   Tobacco abuse   Physical deconditioning   Type 2 diabetes mellitus with hyperglycemia, with long-term current use of insulin  (HCC)   Closed rib fracture   Aortic atherosclerosis (HCC)   Emphysema lung (HCC)   LOS: 2 days   How to contact the [facility] Attending or Consulting provider 7A - 7P or covering provider during after hours [place], for this patient?  1. Check the care team in [facility] and look for a) attending/consulting TRH provider listed and b) the [facility] team listed 2. Log into [email or web address] and use [place]'s universal password to access. If you do not have the password, please contact the hospital operator. 3. Locate the [facility] provider you are looking for under [facility] and page to a number that you can be directly reached. 4. If you still have difficulty reaching the provider, please page the [facility] (Director on Call) for the Hospitalists listed on amion for assistance.

## 2020-05-20 NOTE — Progress Notes (Signed)
RN called to patient rooms by NT. ON arrival, Patient noted choking after drinking water this morning and appearing in distress turning bluish in the face. Patient stated I feeling like I couldn't breath, like I couldn't get anything in or out. Sats were 87% on 3L [place]. HOB was elevated. NT stated patient request she slap his back to allow him to breathe better. RN contacted on call provider. New orders placed for chest xray. Patient is now awake and alert, RR is 20. HR 101, Sats 97%. Endorsed to Public house manager.

## 2020-05-20 NOTE — Progress Notes (Addendum)
PROGRESS NOTE  Jonathan [name] PYP:[id number] DOB: 1941-08-21 DOA: 05/18/2020 PCP: Abner Greenspan, MD  Brief History   79 year old male who was recently discharged from the hospital after treatment for COVID-19 viral infection.  Presented to ED for evaluation of worsening shortness of breath, nonproductive cough and wheezing and was found to be hypoxic with room air pulse oximetry at rest of 86%.  Admitted for COPD exacerbation in the context of recent Covid infection.  . Admit 8/13 to 8/16 for COVID tx w/ remdesivir, steroids. Discharged on steroid taper and compelte outpt remdesivir.  . 8/13 ED, hypoxic, admitted for COVID . 8/12 ED, COVID+, received MAB and was d/c from ED   A & P  Bacteremia due to Staphylococcus aureus --remains afebril, vital signs stable --Source remains unclear.  Remote neck surgery.  No other hardware.  No drug use.  Skin appears unremarkable except for distal left ring finger, which has circumferential erythema. Tender but no abscess or lesions. Ddx infection, gout, fx. Will check xray and follow. Continue abx. --Continue cefazolin.  Follow-up culture results and sensitivities.  Infectious disease consultation 8/23. --2D echocardiogram unrevealing.  TEE next week.  COPD with acute exacerbation ([place]) --resolved. On oxygen at home. --Continue oral steroids.  Continue bronchodilators.  Continue supplemental oxygen.  COVID-19 virus infection --Status post treatment last hospitalization with remdesivir and steroids.  Appears asymptomatic at this time.  Also received monoclonal antibody infusion 8/12. --Monitor clinically.  Type 2 diabetes mellitus with hyperglycemia, with long-term current use of insulin (HCC) --labile.  Continue current management with Lantus, sliding scale insulin but if high tomorrow will increase Lantus or add meal coverage  Closed rib fracture --Etiology unclear.  History unreliable.  Reports some pain.  Minimally displaced right  anterolateral seventh rib fracture, with small surrounding hematoma. No evidence of hemothorax or pneumothorax.  Monitor clinically.  Lung mass --1.4 cm spiculated right lower lobe mass, highly concerning for bronchogenic malignancy. PET-CT is recommended for further Evaluation. --f/u as outpt  Aortic atherosclerosis ([place]) --continue statin  Emphysema lung ([place]) --as per COPD  Chronic respiratory failure with hypoxia (HCC) --remains stable , continue current care  Finger pain, left --check xray, continue abx, continue prednisone; follow clinically   Disposition Plan:  Discussion: Respiratory issues appear stable. MSSA bacteremia of unknown source.  Reported left shoulder pain on admission, imaging reveals longstanding chronic issues with the shoulder that would explain pain.  No signs or symptoms of infection he moved quite well yesterday.  We will investigate left ring finger erythema, check x-ray, continue antibiotics.  If worsens, will consult with orthopedics.  Status is: Inpatient  Remains inpatient appropriate because:IV treatments appropriate due to intensity of illness or inability to take PO  Dispo: The patient is from: Home              Anticipated d/c is to: Home              Anticipated d/c date is: 3 days              Patient currently is not medically stable to d/c.  DVT prophylaxis: enoxaparin (LOVENOX) injection 40 mg Start: 05/18/20 1630  Code Status: Full Family Communication:  none  Murray Hodgkins, MD  [facility] Direct contact: see [email or web address] (further directions at bottom of note if needed) 7PM-7AM contact night coverage as at bottom of note 05/20/2020, 3:03 PM  LOS: 2 days   Significant Hospital Events   . 8/20 admit   Consults:  .  ID 8/23   Procedures:  .   Significant Diagnostic Tests:  [name]    Micro Data:  . 8/20 BC > GPC ID MSSA    Antimicrobials:  . Cefazolin 8/2 >  Interval History/Subjective  Confused at times per RN, other  times appropriate Reports pain all over Left ringer pain for a few days Breathing fine  Objective   Vitals:  Vitals:   05/20/20 0813 05/20/20 1104  BP: (!) 118/58 (!) 121/59  Pulse: 91 86  Resp: (!) 23 20  Temp: 98.9 F (37.2 C) 97.6 F (36.4 C)  SpO2: 99% 97%    Exam:  Constitutional:   . Appears calm and comfortable ENMT:  . grossly normal hearing  Respiratory:  . CTA bilaterally, no w/r/r.  . Respiratory effort normal.  Cardiovascular:  . RRR, no m/r/g . No LE extremity edema   Abdomen:  . Soft ntnd Musculoskeletal:  . Digits/nails BUE: left ring finger distal erythema, tenderness to touch, no wound, no lesions, no edema Skin:  . No rashes, lesions, ulcers except as above . Back, anus, perineum examined 8/21, unremarkabe . Scrotum, penis unremarkable Psychiatric:  . Mental status o Mood, affect appropriate . Confused as to location .  I have personally reviewed the following:   Today's Data  . CBG high, generally in 200s . Mg WNL . WBC no sig change, 28.5  Scheduled Meds: . aspirin EC  81 mg Oral Daily  . atorvastatin  10 mg Oral Daily  . enoxaparin (LOVENOX) injection  40 mg Subcutaneous Q24H  . insulin aspart  0-15 Units Subcutaneous TID WC  . insulin detemir  20 Units Subcutaneous QHS  . mometasone-formoterol  2 puff Inhalation BID   And  . umeclidinium bromide  1 puff Inhalation Daily  . nicotine  21 mg Transdermal Daily  . predniSONE  40 mg Oral Q breakfast   Continuous Infusions: . sodium chloride 250 mL (05/19/20 0507)  .  ceFAZolin (ANCEF) IV 2 g (05/20/20 1442)  . famotidine (PEPCID) IV      Principal Problem:   Bacteremia due to Staphylococcus aureus Active Problems:   COPD with acute exacerbation (HCC)   Lung mass   Chronic respiratory failure with hypoxia (HCC)   Finger pain, left   COVID-19 virus infection   Tobacco abuse   Physical deconditioning   Type 2 diabetes mellitus with hyperglycemia, with long-term current use of  insulin (HCC)   Closed rib fracture   Aortic atherosclerosis (HCC)   Emphysema lung (HCC)   LOS: 2 days   How to contact the [facility] Attending or Consulting provider 7A - 7P or covering provider during after hours [place], for this patient?  1. Check the care team in [facility] and look for a) attending/consulting TRH provider listed and b) the [facility] team listed 2. Log into [email or web address] and use [place]'s universal password to access. If you do not have the password, please contact the hospital operator. 3. Locate the [facility] provider you are looking for under [facility] and page to a number that you can be directly reached. 4. If you still have difficulty reaching the provider, please page the [facility] (Director on Call) for the Hospitalists listed on amion for assistance.

## 2020-05-21 DIAGNOSIS — M79645 Pain in left finger(s): Secondary | ICD-10-CM

## 2020-05-21 LAB — CULTURE, BLOOD (ROUTINE X 2)
Special Requests: ADEQUATE
Special Requests: ADEQUATE

## 2020-05-21 LAB — CBC
HCT: 52.4 % — ABNORMAL HIGH (ref 39.0–52.0)
Hemoglobin: 17.8 g/dL — ABNORMAL HIGH (ref 13.0–17.0)
MCH: 28.8 pg (ref 26.0–34.0)
MCHC: 34 g/dL (ref 30.0–36.0)
MCV: 84.9 fL (ref 80.0–100.0)
Platelets: 289 10*3/uL (ref 150–400)
RBC: 6.17 MIL/uL — ABNORMAL HIGH (ref 4.22–5.81)
RDW: 13.2 % (ref 11.5–15.5)
WBC: 28 10*3/uL — ABNORMAL HIGH (ref 4.0–10.5)
nRBC: 0 % (ref 0.0–0.2)

## 2020-05-21 LAB — GLUCOSE, CAPILLARY
Glucose-Capillary: 155 mg/dL — ABNORMAL HIGH (ref 70–99)
Glucose-Capillary: 64 mg/dL — ABNORMAL LOW (ref 70–99)
Glucose-Capillary: 128 mg/dL — ABNORMAL HIGH (ref 70–99)
Glucose-Capillary: 113 mg/dL — ABNORMAL HIGH (ref 70–99)

## 2020-05-21 MED ORDER — POLYETHYLENE GLYCOL 3350 17 G PO PACK
17.0000 g | PACK | Freq: Two times a day (BID) | ORAL | Status: DC
  Administered 2020-05-21 – 2020-05-26 (×8): 17 g via ORAL
  Filled 2020-05-21 (×12): qty 1

## 2020-05-21 MED ORDER — SENNOSIDES-DOCUSATE SODIUM 8.6-50 MG PO TABS
1.0000 | ORAL_TABLET | Freq: Every day | ORAL | Status: DC
  Administered 2020-05-21 – 2020-05-28 (×7): 1 via ORAL
  Filled 2020-05-21 (×8): qty 1

## 2020-05-21 MED ORDER — INSULIN DETEMIR 100 UNIT/ML ~~LOC~~ SOLN
20.0000 [IU] | Freq: Every day | SUBCUTANEOUS | Status: DC
  Administered 2020-05-21: 22:00:00 20 [IU] via SUBCUTANEOUS
  Filled 2020-05-21 (×2): qty 0.2

## 2020-05-21 NOTE — Care Management Important Message (Signed)
Important Message  Patient Details  Name: Jonathan [name] MRN: [id number] Date of Birth: 1941-09-14   Medicare Important Message Given:  Yes  Reviewed with patient's wife.    Dannette Barbara 05/21/2020, 1:25 PM

## 2020-05-21 NOTE — TOC Initial Note (Signed)
Transition of Care [facility]) - Initial/Assessment Note    Patient Details  Name: Jonathan [name] MRN: [id number] Date of Birth: Jul 19, 1941  Transition of Care [facility]) CM/SW Contact:    Shelbie Ammons, RN Phone Number: 05/21/2020, 9:54 AM  Clinical Narrative:    RNCM reached out and spoke with patient's wife by telephone after being unable to get patient on phone. RNCM explained reason for call and that the current recommendations are for patient to go to SNF after hospital due to need for additional intensive therapy. Wife verbalizes that patient has went to [facility] several years ago and did well there and that she is hopeful that he will be agreeable to go again. Wife is agreeable to bed search being initiated and then when bed offers are made to present them to patient and wife at the same time.  RNCM verified PASSR, completed FL-2 and initiated bed search. RNCM will follow and present bed offers when received.          Expected Discharge Plan: Skilled Nursing Facility Barriers to Discharge: Continued Medical Work up   Patient Goals and CMS Choice     Choice offered to / list presented to : Spouse  Expected Discharge Plan and Services Expected Discharge Plan: [place] Acute Care Choice: [place] Living arrangements for the past 2 months: Single Family Home                                      Prior Living Arrangements/Services Living arrangements for the past 2 months: Single Family Home Lives with:: Spouse Patient language and need for interpreter reviewed:: Yes        Need for Family Participation in Patient Care: Yes (Comment) Care giver support system in place?: Yes (comment)   Criminal Activity/Legal Involvement Pertinent to Current Situation/Hospitalization: No - Comment as needed  Activities of Daily Living      Permission Sought/Granted                  Emotional Assessment   Attitude/Demeanor/Rapport:  Engaged Affect (typically observed): Appropriate Orientation: : Oriented to Self, Oriented to Place, Oriented to  Time, Oriented to Situation Alcohol / Substance Use: Not Applicable Psych Involvement: No (comment)  Admission diagnosis:  COPD exacerbation ([place]) [J44.1] Acute respiratory failure with hypoxia ([place]) [J96.01] COPD with acute exacerbation ([place]) [J44.1] COVID-19 virus infection [U07.1] Patient Active Problem List   Diagnosis Date Noted  . Finger pain, left 05/20/2020  . Bacteremia due to Staphylococcus aureus 05/19/2020  . Closed rib fracture 05/19/2020  . Lung mass 05/19/2020  . Aortic atherosclerosis ([place]) 05/19/2020  . Emphysema lung ([place]) 05/19/2020  . Chronic respiratory failure with hypoxia ([place]) 05/19/2020  . COPD with acute exacerbation ([place]) 05/18/2020  . COVID-19 virus infection 05/11/2020  . Medicare annual wellness visit, subsequent 04/09/2020  . Vision changes 04/09/2020  . Routine general medical examination at a health care facility 11/25/2016  . Localization-related symptomatic epilepsy and epileptic syndromes with complex partial seizures, not intractable, without status epilepticus ([place]) 05/06/2016  . Intracranial vascular stenosis 05/06/2016  . History of stroke 05/06/2016  . Type 2 diabetes mellitus with hyperglycemia, with long-term current use of insulin ([place]) 12/04/2015  . History of shingles 09/08/2014  . Medically noncompliant 05/05/2014  . Physical deconditioning 12/20/2013  . Chronic respiratory failure ([place]) 12/15/2013  . Acute respiratory failure with hypoxia (  [place]) 11/28/2013  . Immunization not carried out because of patient refusal 07/05/2012  . Hyperlipidemia associated with type 2 diabetes mellitus ([place])   . Pulmonary nodule, right   . Allergic rhinitis   . ED (erectile dysfunction)   . Tobacco abuse   . COPD (chronic obstructive pulmonary disease) ([place]) 03/29/2010  . Essential hypertension 01/05/2007   PCP:  Abner Greenspan,  MD Pharmacy:   [facility], [address], [address] [place], [address] [place] [address] Phone: [phone] Fax: [phone]  [facility], [place], [place] [address], [place] [address] Phone: [phone] Fax: [phone]  [id number] [place], [address] [place] [address] Phone: [phone] Fax: [phone]     Social Determinants of Health (SDOH) Interventions    Readmission Risk Interventions No flowsheet data found.

## 2020-05-21 NOTE — Consult Note (Addendum)
NAME: Jonathan [name]  DOB: 09/26/1941  MRN: [id number]  Date/Time: 05/21/2020 2:14 PM  REQUESTING PROVIDER: Dr. Sarajane Jews Subjective:  REASON FOR CONSULT: Staph aureus bacteremia ?Chart reviewed.  No history available from patient. Jonathan [name] is a 79 y.o. male with a history of COPD, diabetes mellitus, hyperlipidemia, hypertension, recently diagnosed Covid is admitted with shortness of breath. Patient initially presented to [facility] long ED on 05/10/2020 with multiple falls the past 24 hours.  He also was having cough for a few days.  His partner had tested positive for Covid a week ago.  So he was given monoclonal antibodies and initially the partner did not want him back home because She was not well.  Case manager was involved and after talking to his daughter patient was discharged home.  But his partner refused to take him because she could not care for him and hence he was sent back to the ED on 05/11/2020.Jonathan [name]  He was admitted to the hospital to receive remdesivir and steroids because of hypoxia.  He received 3 days of remdesivir and was discharged and asked to continue that at the infusion clinic.  He came back to [facility] ED on 05/18/2020 complaining of increasing shortness of breath, generalized weakness and feeling unwell.  He was a cough having a cough which was nonproductive.  Did not have any fever or chills.  In the ED the pulse ox was 86% on room air.  Temperature was 97.7, BP 132/76, heart rate of 105.  Labs revealed WBC of 24.2, hemoglobin of 17.7 and platelet count of 329.  Ferritin was 833, CRP was 30.9, lactate 2.6, procalcitonin 0.29, and creatinine was 1.05. He was started on ceftriaxone.  As blood culture is positive for staph aureus I am seeing the patient. Patient is a poor historian unable to get any proper history from him.  Past Medical History:  Diagnosis Date  . Allergic rhinitis   . Aortic atherosclerosis ([place]) 05/19/2020  . Asthma   . COPD (chronic  obstructive pulmonary disease) ([place])   . Dermatitis seborrheica   . Diabetes mellitus type II   . ED (erectile dysfunction)   . Emphysema lung ([place]) 05/19/2020  . Frozen shoulder   . HLD (hyperlipidemia)   . HTN (hypertension)   . Hyperkalemia   . Kidney stone   . Other diseases of lung, not elsewhere classified   . Pulmonary nodule, right    lower lobe  . Rotator cuff injury    right  . Shoulder pain   . Stroke [facility])    pt states "last year"  . Tobacco abuse     Past Surgical History:  Procedure Laterality Date  . CERVICAL DISCECTOMY  1998  . EEG    . ETT  11/1994  . TEE WITHOUT CARDIOVERSION  12/03/2011   Procedure: TRANSESOPHAGEAL ECHOCARDIOGRAM (TEE);  Surgeon: Lelon Perla, MD;  Location: [facility] ENDOSCOPY;  Service: Cardiovascular;  Laterality: N/A;  . TM repair      Social History   Socioeconomic History  . Marital status: Married    Spouse name: Not on file  . Number of children: Not on file  . Years of education: Not on file  . Highest education level: Not on file  Occupational History  . Occupation: Retired    Comment: Air cabin crew  Tobacco Use  . Smoking status: Current Every Day Smoker    Packs/day: 1.00    Years: 65.00    Pack years: 65.00  Types: Cigarettes  . Smokeless tobacco: Never Used  Vaping Use  . Vaping Use: Never used  Substance and Sexual Activity  . Alcohol use: No    Alcohol/week: 0.0 standard drinks    Comment: Quit 40 years ago  . Drug use: No  . Sexual activity: Never    Partners: Female  Other Topics Concern  . Not on file  Social History Narrative  . Not on file   Social Determinants of Health   Financial Resource Strain:   . Difficulty of Paying Living Expenses: Not on file  Food Insecurity:   . Worried About Charity fundraiser in the Last Year: Not on file  . Ran Out of Food in the Last Year: Not on file  Transportation Needs:   . Lack of Transportation (Medical): Not on file  . Lack of Transportation  (Non-Medical): Not on file  Physical Activity:   . Days of Exercise per Week: Not on file  . Minutes of Exercise per Session: Not on file  Stress:   . Feeling of Stress : Not on file  Social Connections:   . Frequency of Communication with Friends and Family: Not on file  . Frequency of Social Gatherings with Friends and Family: Not on file  . Attends Religious Services: Not on file  . Active Member of Clubs or Organizations: Not on file  . Attends Archivist Meetings: Not on file  . Marital Status: Not on file  Intimate Partner Violence:   . Fear of Current or Ex-Partner: Not on file  . Emotionally Abused: Not on file  . Physically Abused: Not on file  . Sexually Abused: Not on file    Family History  Problem Relation Age of Onset  . Asthma Sister   . Emphysema Sister   . Diabetes Mother   . Diabetes Brother   . Heart disease Brother    Allergies  Allergen Reactions  . Amoxicillin-Pot Clavulanate Other (See Comments)    Severe stomach pain.   . Quinapril Hcl Other (See Comments)    back pain  . Valsartan Other (See Comments)    back pain  . Varenicline Tartrate Other (See Comments)    AMS, hallucinations, night mares    ? Current Facility-Administered Medications  Medication Dose Route Frequency Provider Last Rate Last Admin  . 0.9 %  sodium chloride infusion   Intravenous PRN Little Ishikawa, MD 10 mL/hr at 05/19/20 0507 250 mL at 05/19/20 0507  . albuterol (PROVENTIL) (2.5 MG/3ML) 0.083% nebulizer solution 3 mL  3 mL Inhalation Once PRN Little Ishikawa, MD      . albuterol (VENTOLIN HFA) 108 (90 Base) MCG/ACT inhaler 2 puff  2 puff Inhalation Q6H PRN Agbata, Tochukwu, MD      . aspirin EC tablet 81 mg  81 mg Oral Daily Agbata, Tochukwu, MD   81 mg at 05/21/20 1149  . atorvastatin (LIPITOR) tablet 10 mg  10 mg Oral Daily Agbata, Tochukwu, MD   10 mg at 05/21/20 1149  . ceFAZolin (ANCEF) IVPB 2g/100 mL premix  2 g Intravenous Q8H Hall, Scott A, RPH  200 mL/hr at 05/21/20 0607 2 g at 05/21/20 7106  . diphenhydrAMINE (BENADRYL) injection 50 mg  50 mg Intravenous Once PRN Little Ishikawa, MD      . enoxaparin (LOVENOX) injection 40 mg  40 mg Subcutaneous Q24H Agbata, Tochukwu, MD   40 mg at 05/20/20 2252  . EPINEPHrine (EPI-PEN) injection 0.3 mg  0.3  mg Intramuscular Once PRN Little Ishikawa, MD      . famotidine (PEPCID) IVPB 20 mg premix  20 mg Intravenous Once PRN Little Ishikawa, MD      . insulin aspart (novoLOG) injection 0-15 Units  0-15 Units Subcutaneous TID WC Agbata, Tochukwu, MD   3 Units at 05/21/20 1019  . insulin detemir (LEVEMIR) injection 40 Units  40 Units Subcutaneous QHS Samuella Cota, MD   40 Units at 05/20/20 2253  . mometasone-formoterol (DULERA) 100-5 MCG/ACT inhaler 2 puff  2 puff Inhalation BID Samuella Cota, MD   2 puff at 05/21/20 0820   And  . umeclidinium bromide (INCRUSE ELLIPTA) 62.5 MCG/INH 1 puff  1 puff Inhalation Daily Samuella Cota, MD   1 puff at 05/21/20 0820  . nicotine (NICODERM CQ - dosed in mg/24 hours) patch 21 mg  21 mg Transdermal Daily Agbata, Tochukwu, MD   21 mg at 05/21/20 0820  . oxyCODONE (Oxy IR/ROXICODONE) immediate release tablet 5 mg  5 mg Oral Q6H PRN Lang Snow, NP   5 mg at 05/21/20 0608  . senna-docusate (Senokot-S) tablet 1 tablet  1 tablet Oral QHS Samuella Cota, MD         Abtx:  Anti-infectives (From admission, onward)   Start     Dose/Rate Route Frequency Ordered Stop   05/19/20 1000  remdesivir 100 mg in sodium chloride 0.9 % 100 mL IVPB        100 mg 200 mL/hr over 30 Minutes Intravenous  Once 05/18/20 2112 05/19/20 1108   05/19/20 0500  ceFAZolin (ANCEF) IVPB 2g/100 mL premix        2 g 200 mL/hr over 30 Minutes Intravenous Every 8 hours 05/19/20 0127     05/18/20 2115  remdesivir 100 mg in sodium chloride 0.9 % 100 mL IVPB  Status:  Discontinued        100 mg 200 mL/hr over 30 Minutes Intravenous  Once 05/18/20 2112  05/18/20 2157   05/18/20 1300  cefTRIAXone (ROCEPHIN) 2 g in sodium chloride 0.9 % 100 mL IVPB        2 g 200 mL/hr over 30 Minutes Intravenous  Once 05/18/20 1249 05/18/20 1456   05/18/20 1300  azithromycin (ZITHROMAX) 500 mg in sodium chloride 0.9 % 250 mL IVPB        500 mg 250 mL/hr over 60 Minutes Intravenous  Once 05/18/20 1249 05/18/20 1609      REVIEW OF SYSTEMS:  Patient repeatedly says he is very thirsty.  He does not know why he is in the hospital. He says he is very tired and weak   Objective:  VITALS:  BP (!) 150/62 (BP Location: Right Arm)   Pulse 84   Temp (!) 97.4 F (36.3 C) (Oral)   Resp 20   Ht 5\' 5"  (1.651 m)   Wt 71.2 kg   SpO2 93%   BMI 26.12 kg/m  PHYSICAL EXAM:  General: Chronically ill Lethargic  Head: Normocephalic, without obvious abnormality, atraumatic. Eyes: Conjunctivae clear, anicteric sclerae. Pupils are equal Could not examine his ear nose and throat. Neck: Supple,  Back: No CVA tenderness. Lungs: Bilateral air entry. Crypts in the bases  Heart: S1-S2 Abdomen: Soft, non-tender,not distended. Bowel sounds normal. No masses Extremities: Left ring finger swollen erythematous and very painful to touch  As such fingertips have some erythema      skin: Skin is very thin with tears and age-related changes, bruising  Lymph: Cervical, supraclavicular normal. Neurologic: Grossly non-focal.  Could not be examined in detail Pertinent Labs Lab Results CBC    Component Value Date/Time   WBC 28.0 (H) 05/21/2020 0537   RBC 6.17 (H) 05/21/2020 0537   HGB 17.8 (H) 05/21/2020 0537   HCT 52.4 (H) 05/21/2020 0537   PLT 289 05/21/2020 0537   MCV 84.9 05/21/2020 0537   MCH 28.8 05/21/2020 0537   MCHC 34.0 05/21/2020 0537   RDW 13.2 05/21/2020 0537   LYMPHSABS 1.7 05/18/2020 1220   MONOABS 1.1 (H) 05/18/2020 1220   EOSABS 0.0 05/18/2020 1220   BASOSABS 0.1 05/18/2020 1220    CMP Latest Ref Rng & Units 05/19/2020 05/18/2020 05/14/2020    Glucose 70 - 99 mg/dL 204(H) 285(H) 214(H)  BUN 8 - 23 mg/dL 23 24(H) 23  Creatinine 0.61 - 1.24 mg/dL 0.91 1.05 0.94  Sodium 135 - 145 mmol/L 136 135 136  Potassium 3.5 - 5.1 mmol/L 3.1(L) 3.5 4.3  Chloride 98 - 111 mmol/L 92(L) 90(L) 97(L)  CO2 22 - 32 mmol/L 31 27 27   Calcium 8.9 - 10.3 mg/dL 8.6(L) 8.7(L) 8.3(L)  Total Protein 6.5 - 8.1 g/dL - 7.2 6.4(L)  Total Bilirubin 0.3 - 1.2 mg/dL - 2.0(H) 0.9  Alkaline Phos 38 - 126 U/L - 87 60  AST 15 - 41 U/L - 24 24  ALT 0 - 44 U/L - 20 17      Microbiology: Recent Results (from the past 240 hour(s))  Blood Culture (routine x 2)     Status: Abnormal   Collection Time: 05/18/20 12:27 PM   Specimen: BLOOD  Result Value Ref Range Status   Specimen Description   Final    BLOOD BLOOD LEFT HAND Performed at [facility], [address], [place], [place] [address]    Special Requests   Final    BOTTLES DRAWN AEROBIC AND ANAEROBIC Blood Culture adequate volume Performed at [facility], [address] [place], [place] [address]    Culture  Setup Time   Final    Organism ID to follow [place] AND ANAEROBIC BOTTLES CRITICAL RESULT CALLED TO, READ BACK BY AND VERIFIED WITH: SCOTT HALL 05/19/20 AT 0010 HS Performed at [facility], [address] [place], [place] [address]    Culture STAPHYLOCOCCUS AUREUS (A)  Final   Report Status 05/21/2020 FINAL  Final   Organism ID, Bacteria STAPHYLOCOCCUS AUREUS  Final      Susceptibility   Staphylococcus aureus - MIC*    CIPROFLOXACIN <=0.5 SENSITIVE Sensitive     ERYTHROMYCIN >=8 RESISTANT Resistant     GENTAMICIN <=0.5 SENSITIVE Sensitive     OXACILLIN 0.5 SENSITIVE Sensitive     TETRACYCLINE <=1 SENSITIVE Sensitive     VANCOMYCIN <=0.5 SENSITIVE Sensitive     TRIMETH/SULFA <=10 SENSITIVE Sensitive     CLINDAMYCIN <=0.25 SENSITIVE Sensitive     RIFAMPIN <=0.5 SENSITIVE Sensitive     Inducible Clindamycin NEGATIVE Sensitive     *  STAPHYLOCOCCUS AUREUS  Blood Culture (routine x 2)     Status: Abnormal   Collection Time: 05/18/20 12:27 PM   Specimen: BLOOD  Result Value Ref Range Status   Specimen Description   Final    BLOOD BLOOD LEFT FOREARM Performed at [facility], [address], [place], [place] [address]    Special Requests   Final    BOTTLES DRAWN AEROBIC AND ANAEROBIC Blood Culture adequate volume Performed at [facility], [place], [place] [address]    Culture  Setup Time   Final    GRAM POSITIVE COCCI IN BOTH AEROBIC AND ANAEROBIC BOTTLES CRITICAL VALUE NOTED.  VALUE IS CONSISTENT WITH PREVIOUSLY REPORTED AND CALLED VALUE. Performed at [facility], [address] [place], [place] [address]    Culture (A)  Final    STAPHYLOCOCCUS AUREUS SUSCEPTIBILITIES PERFORMED ON PREVIOUS CULTURE WITHIN THE LAST 5 DAYS. Performed at [facility], [place], [place], [place] [address]    Report Status 05/21/2020 FINAL  Final  Blood Culture ID Panel (Reflexed)     Status: Abnormal   Collection Time: 05/18/20 12:27 PM  Result Value Ref Range Status   Enterococcus faecalis NOT DETECTED NOT DETECTED Final   Enterococcus Faecium NOT DETECTED NOT DETECTED Final   Listeria monocytogenes NOT DETECTED NOT DETECTED Final   Staphylococcus species DETECTED (A) NOT DETECTED Final    Comment: CRITICAL RESULT CALLED TO, READ BACK BY AND VERIFIED WITH: SCOTT HALL 05/19/20 AT 0010 HS    Staphylococcus aureus (BCID) DETECTED (A) NOT DETECTED Final    Comment: CRITICAL RESULT CALLED TO, READ BACK BY AND VERIFIED WITH: SCOTT HALL 05/19/20 AT 0010 HS    Staphylococcus epidermidis NOT DETECTED NOT DETECTED Final   Staphylococcus lugdunensis NOT DETECTED NOT DETECTED Final   Streptococcus species NOT DETECTED NOT DETECTED Final   Streptococcus agalactiae NOT DETECTED NOT DETECTED Final   Streptococcus pneumoniae NOT DETECTED NOT DETECTED Final   Streptococcus pyogenes  NOT DETECTED NOT DETECTED Final   A.calcoaceticus-baumannii NOT DETECTED NOT DETECTED Final   Bacteroides fragilis NOT DETECTED NOT DETECTED Final   Enterobacterales NOT DETECTED NOT DETECTED Final   Enterobacter cloacae complex NOT DETECTED NOT DETECTED Final   Escherichia coli NOT DETECTED NOT DETECTED Final   Klebsiella aerogenes NOT DETECTED NOT DETECTED Final   Klebsiella oxytoca NOT DETECTED NOT DETECTED Final   Klebsiella pneumoniae NOT DETECTED NOT DETECTED Final   Proteus species NOT DETECTED NOT DETECTED Final   Salmonella species NOT DETECTED NOT DETECTED Final   Serratia marcescens NOT DETECTED NOT DETECTED Final   Haemophilus influenzae NOT DETECTED NOT DETECTED Final   Neisseria meningitidis NOT DETECTED NOT DETECTED Final   Pseudomonas aeruginosa NOT DETECTED NOT DETECTED Final   Stenotrophomonas maltophilia NOT DETECTED NOT DETECTED Final   Candida albicans NOT DETECTED NOT DETECTED Final   Candida auris NOT DETECTED NOT DETECTED Final   Candida glabrata NOT DETECTED NOT DETECTED Final   Candida krusei NOT DETECTED NOT DETECTED Final   Candida parapsilosis NOT DETECTED NOT DETECTED Final   Candida tropicalis NOT DETECTED NOT DETECTED Final   Cryptococcus neoformans/gattii NOT DETECTED NOT DETECTED Final   Meth resistant mecA/C and MREJ NOT DETECTED NOT DETECTED Final    Comment: Performed at [facility], [address] [place], [place] [address]  Culture, blood (Routine X 2) w Reflex to ID Panel     Status: None (Preliminary result)   Collection Time: 05/20/20  4:42 PM   Specimen: BLOOD  Result Value Ref Range Status   Specimen Description BLOOD BLOOD LEFT WRIST  Final   Special Requests   Final    BOTTLES DRAWN AEROBIC AND ANAEROBIC Blood Culture adequate volume   Culture   Final    NO GROWTH < 24 HOURS Performed at [facility], [address] [place], [place] [address]    Report Status PENDING  Incomplete  Culture, blood (Routine X 2) w  Reflex to ID Panel     Status: None (Preliminary result)   Collection Time: 05/20/20  4:46 PM   Specimen: BLOOD  Result Value Ref Range Status   Specimen Description BLOOD LEFT ANTECUBITAL  Final   Special Requests   Final    BOTTLES DRAWN AEROBIC AND ANAEROBIC Blood Culture results may not be optimal due to an excessive volume of blood received in culture bottles   Culture   Final    NO GROWTH < 24 HOURS Performed at [facility], [address], [place], [place] [address]    Report Status PENDING  Incomplete    IMAGING RESULTS:  I have personally reviewed the films ?There is a minimally displaced right anterolateral seventh rib fracture. There is a small amount of subcutaneous gas within the adjacent soft tissues, as well as a small hematoma surrounding the fracture line and extending into the pleural space, measuring up to 2 cm in thickness.   Impression/Recommendation ?Patient with Covid diagnosed on 05/10/2020 and received Regeneron monoclonal antibody, remdesivir and now has staph aureus bacteremia  Staph aureus bacteremia unclear source. Has a tender painful left third finger.  Wonder whether it is the source or result of the bacteremia Repeat blood cultures have been negative so far 2D echo has been done but is a poor study and the limited study shows valves to be okay. He will need TEE. He has leukocytosis and hence would recommend hand surgeon to evaluate the patient. No hardware ?We will change the cefazolin to nafcillin as leukocytes persist.  COPD on oxygen 1.4 cm spiculated nodule on the right lower lobe concerning for malignancy No pneumonia  Hypertension  Hyperlipidemia on atorvastatin Smoker.   Diabetes mellitus currently on sliding scale.   Discussed the management with the hospitalist.   ? ________________________________________ Note:  This document was prepared using Dragon voice recognition software and may include unintentional dictation  errors.

## 2020-05-21 NOTE — NC FL2 (Signed)
[place] LEVEL OF CARE SCREENING TOOL     IDENTIFICATION  Patient Name: Jonathan [name] Birthdate: May 01, 1941 Sex: male Admission Date (Current Location): 05/18/2020  [place] and [place] Number:  Engineering geologist and Address:  [facility], [address], [place], [place] [address]      Provider Number: [id number]  Attending Physician Name and Address:  Samuella Cota, MD  Relative Name and Phone Number:  Gardy Montanari [phone]    Current Level of Care: Hospital Recommended Level of Care: [place] Prior Approval Number:    Date Approved/Denied:   PASRR Number: [id number]  Discharge Plan: SNF    Current Diagnoses: Patient Active Problem List   Diagnosis Date Noted  . Finger pain, left 05/20/2020  . Bacteremia due to Staphylococcus aureus 05/19/2020  . Closed rib fracture 05/19/2020  . Lung mass 05/19/2020  . Aortic atherosclerosis ([place]) 05/19/2020  . Emphysema lung ([place]) 05/19/2020  . Chronic respiratory failure with hypoxia ([place]) 05/19/2020  . COPD with acute exacerbation ([place]) 05/18/2020  . COVID-19 virus infection 05/11/2020  . Medicare annual wellness visit, subsequent 04/09/2020  . Vision changes 04/09/2020  . Routine general medical examination at a health care facility 11/25/2016  . Localization-related symptomatic epilepsy and epileptic syndromes with complex partial seizures, not intractable, without status epilepticus ([place]) 05/06/2016  . Intracranial vascular stenosis 05/06/2016  . History of stroke 05/06/2016  . Type 2 diabetes mellitus with hyperglycemia, with long-term current use of insulin ([place]) 12/04/2015  . History of shingles 09/08/2014  . Medically noncompliant 05/05/2014  . Physical deconditioning 12/20/2013  . Chronic respiratory failure ([place]) 12/15/2013  . Acute respiratory failure with hypoxia ([place]) 11/28/2013  . Immunization not carried out because of patient refusal  07/05/2012  . Hyperlipidemia associated with type 2 diabetes mellitus ([place])   . Pulmonary nodule, right   . Allergic rhinitis   . ED (erectile dysfunction)   . Tobacco abuse   . COPD (chronic obstructive pulmonary disease) ([place]) 03/29/2010  . Essential hypertension 01/05/2007    Orientation RESPIRATION BLADDER Height & Weight     Self, Place, Time  Normal Incontinent Weight: 71.2 kg Height:  5\' 5"  (165.1 cm)  BEHAVIORAL SYMPTOMS/MOOD NEUROLOGICAL BOWEL NUTRITION STATUS      Incontinent Diet (Dysphagia 3)  AMBULATORY STATUS COMMUNICATION OF NEEDS Skin   Extensive Assist Verbally Normal                       Personal Care Assistance Level of Assistance  Bathing, Feeding, Dressing Bathing Assistance: Limited assistance Feeding assistance: Limited assistance Dressing Assistance: Limited assistance     Functional Limitations Info             SPECIAL CARE FACTORS FREQUENCY  PT (By licensed PT), OT (By licensed OT)                    Contractures Contractures Info: Not present    Additional Factors Info  Code Status, Allergies Code Status Info: Full Allergies Info: Amoxicillin, Quinapril, Valsartan, Varenicline Tartrate           Current Medications (05/21/2020):  This is the current hospital active medication list Current Facility-Administered Medications  Medication Dose Route Frequency Provider Last Rate Last Admin  . 0.9 %  sodium chloride infusion   Intravenous PRN Little Ishikawa, MD 10 mL/hr at 05/19/20 0507 250 mL at 05/19/20 0507  . albuterol (PROVENTIL) (2.5 MG/3ML) 0.083% nebulizer solution 3  mL  3 mL Inhalation Once PRN Little Ishikawa, MD      . albuterol (VENTOLIN HFA) 108 (90 Base) MCG/ACT inhaler 2 puff  2 puff Inhalation Q6H PRN Agbata, Tochukwu, MD      . aspirin EC tablet 81 mg  81 mg Oral Daily Agbata, Tochukwu, MD   81 mg at 05/20/20 0819  . atorvastatin (LIPITOR) tablet 10 mg  10 mg Oral Daily Agbata, Tochukwu, MD   10 mg at  05/20/20 0820  . ceFAZolin (ANCEF) IVPB 2g/100 mL premix  2 g Intravenous Q8H Hall, Scott A, RPH 200 mL/hr at 05/21/20 0607 2 g at 05/21/20 1165  . diphenhydrAMINE (BENADRYL) injection 50 mg  50 mg Intravenous Once PRN Little Ishikawa, MD      . enoxaparin (LOVENOX) injection 40 mg  40 mg Subcutaneous Q24H Agbata, Tochukwu, MD   40 mg at 05/20/20 2252  . EPINEPHrine (EPI-PEN) injection 0.3 mg  0.3 mg Intramuscular Once PRN Little Ishikawa, MD      . famotidine (PEPCID) IVPB 20 mg premix  20 mg Intravenous Once PRN Little Ishikawa, MD      . insulin aspart (novoLOG) injection 0-15 Units  0-15 Units Subcutaneous TID WC Agbata, Tochukwu, MD   11 Units at 05/20/20 1710  . insulin detemir (LEVEMIR) injection 40 Units  40 Units Subcutaneous QHS Samuella Cota, MD   40 Units at 05/20/20 2253  . mometasone-formoterol (DULERA) 100-5 MCG/ACT inhaler 2 puff  2 puff Inhalation BID Samuella Cota, MD   2 puff at 05/21/20 0820   And  . umeclidinium bromide (INCRUSE ELLIPTA) 62.5 MCG/INH 1 puff  1 puff Inhalation Daily Samuella Cota, MD   1 puff at 05/21/20 0820  . nicotine (NICODERM CQ - dosed in mg/24 hours) patch 21 mg  21 mg Transdermal Daily Agbata, Tochukwu, MD   21 mg at 05/21/20 0820  . oxyCODONE (Oxy IR/ROXICODONE) immediate release tablet 5 mg  5 mg Oral Q6H PRN Lang Snow, NP   5 mg at 05/21/20 0608     Discharge Medications: Please see discharge summary for a list of discharge medications.  Relevant Imaging Results:  Relevant Lab Results:   Additional Information SS# [id number]  Shelbie Ammons, RN

## 2020-05-21 NOTE — Progress Notes (Addendum)
PROGRESS NOTE  Jonathan [name] ZOX:[id number] DOB: Mar 17, 1941 DOA: 05/18/2020 PCP: Abner Greenspan, MD  Brief History   79 year old male who was recently discharged from the hospital after treatment for COVID-19 viral infection.  Presented to ED for evaluation of worsening shortness of breath, nonproductive cough and wheezing and was found to be hypoxic with room air pulse oximetry at rest of 86%.  Admitted for COPD exacerbation in the context of recent Covid infection.  . Admit 8/13 to 8/16 for COVID tx w/ remdesivir, steroids. Discharged on steroid taper and compelte outpt remdesivir.  . 8/13 ED, hypoxic, admitted for COVID . 8/12 ED, COVID+, received MAB and was d/c from ED   A & P  Bacteremia due to Staphylococcus aureus --remains afebrile, vital signs stable --Source remains unclear.  Remote neck surgery.  No other hardware.  No drug use.  Skin appears unremarkable except for distal left ring finger, which has circumferential erythema, no change today. Tender but no abscess or lesions. Xray nonacute. Discussed w/ Dr. Delaine Lame, may be COVID phenomenon, will monitor --Continue cefazolin.  Follow-up culture results and sensitivities.  Infectious disease consultation today, discussed w/ Dr. Delaine Lame.  --2D echocardiogram unrevealing.  TEE planned but pt refused; cardiology will followup to see if can be scheduled for Wednesday.  COPD with acute exacerbation ([place]) --resolved. On oxygen at home. --Continue bronchodilators.  Continue supplemental oxygen.  COVID-19 virus infection --Status post treatment last hospitalization with remdesivir and steroids.  Appears asymptomatic at this time.  Also received monoclonal antibody infusion 8/12. --Asymptomatic.  Type 2 diabetes mellitus with hyperglycemia, with long-term current use of insulin (HCC) --labile w/ low today while NPO.  Will decrease Lantus 50% and monitor now that pt off steroids.  Closed rib fracture --Minimally displaced  right anterolateral seventh rib fracture, with small surrounding hematoma. No evidence of hemothorax or pneumothorax.  Monitor clinically. 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 PET-CT is recommended for further Evaluation. --f/u as outpt -- I discussed w/ wife and daughter by telephone 8/22  Aortic atherosclerosis ([place]) --continue statin  Emphysema lung ([place]) --as per COPD  Chronic respiratory failure with hypoxia ([place]) --remains stable , continue current care  Finger pain, left --xray nonacute, etiology unclear; may be Covid phenomenon, no evidence of abscess or definite infection; appears perfused; will continue antibiotic and monitor.   Disposition Plan:  Discussion: main issue is MSSA bacteremia. Needs TEE, but refused today. Will see if can get Wednesday. Discussed w/ ID, appreciate consultation, will continue cefazolin.  Status is: Inpatient  Remains inpatient appropriate because:IV treatments appropriate due to intensity of illness or inability to take PO  Dispo: The patient is from: Home              Anticipated d/c is to: Home              Anticipated d/c date is: 3 days              Patient currently is not medically stable to d/c.  DVT prophylaxis: enoxaparin (LOVENOX) injection 40 mg Start: 05/18/20 1630  Code Status: Full Family Communication:  Murray Hodgkins, MD  [facility] Direct contact: see [email or web address] (further directions at bottom of note if needed) 7PM-7AM contact night coverage as at bottom of note 05/21/2020, 2:55 PM  LOS: 3 days   Significant Hospital Events   . 8/20 admit   Consults:  . ID 8/23  Procedures:  .   Significant Diagnostic Tests:  [name]    Micro Data:  . 8/20 BC > [facility] ID MSSA    Antimicrobials:  . Cefazolin 8/2 >  Interval History/Subjective  Feels ok today, angry about NPO Left finger pain  Objective   Vitals:  Vitals:     05/21/20 0821 05/21/20 1200  BP: 123/62 (!) 150/62  Pulse: 87 84  Resp: 20 20  Temp: (!) 97.5 F (36.4 C) (!) 97.4 F (36.3 C)  SpO2: 98% 93%    Exam: Constitutional:   . Appears irritable, but not uncomfortable or toxic ENMT:  . grossly normal hearing  Respiratory:  . CTA bilaterally, no w/r/r.  . Respiratory effort normal. Cardiovascular:  . RRR, no m/r/g . No LE extremity edema   Skin:  . Left ring finger w/o change, tender to touch, red; appears well-perfused. No fluctuance.  Psychiatric:  . Mental status o Mood, affect appropriate  I have personally reviewed the following:   Today's Data  . CBG 64 at noon was NPO . WBC w/o change 28.0 . Finger xray negative for acute process  Scheduled Meds: . aspirin EC  81 mg Oral Daily  . atorvastatin  10 mg Oral Daily  . enoxaparin (LOVENOX) injection  40 mg Subcutaneous Q24H  . insulin aspart  0-15 Units Subcutaneous TID WC  . insulin detemir  20 Units Subcutaneous QHS  . mometasone-formoterol  2 puff Inhalation BID   And  . umeclidinium bromide  1 puff Inhalation Daily  . nicotine  21 mg Transdermal Daily  . senna-docusate  1 tablet Oral QHS   Continuous Infusions: . sodium chloride 250 mL (05/19/20 0507)  .  ceFAZolin (ANCEF) IV 2 g (05/21/20 0947)  . famotidine (PEPCID) IV      Principal Problem:   Bacteremia due to Staphylococcus aureus Active Problems:   Type 2 diabetes mellitus with hyperglycemia, with long-term current use of insulin (HCC)   COPD with acute exacerbation (HCC)   Lung mass   Chronic respiratory failure with hypoxia (HCC)   Finger pain, left   COVID-19 virus infection   Closed rib fracture   Tobacco abuse   Physical deconditioning   Aortic atherosclerosis (HCC)   Emphysema lung (HCC)   LOS: 3 days   How to contact the [facility] Attending or Consulting provider 7A - 7P or covering provider during after hours [place], for this patient?  1. Check the care team in [facility] and look for a)  attending/consulting [facility] provider listed and b) the [facility] team listed 2. Log into [email or web address] and use [place]'s universal password to access. If you do not have the password, please contact the hospital operator. 3. Locate the [facility] provider you are looking for under [facility] and page to a number that you can be directly reached. 4. If you still have difficulty reaching the provider, please page the [facility] (Director on Call) for the Hospitalists listed on amion for assistance.

## 2020-05-21 NOTE — Progress Notes (Signed)
    [facility] has been requested to perform a transesophageal echocardiogram on Jonathan [name] for bacteremia.  After careful review of history and examination, the risks and benefits of transesophageal echocardiogram have been explained including risks of esophageal damage, perforation (1:10,000 risk), bleeding, pharyngeal hematoma as well as other potential complications associated with conscious sedation including aspiration, arrhythmia, respiratory failure and death. Alternatives to treatment were discussed, questions were answered. Pt does not wish to proceed with TEE at this time.  Murray Hodgkins, NP  05/21/2020 9:01 AM

## 2020-05-21 NOTE — Progress Notes (Signed)
Physical Therapy Treatment Patient Details Name: Jonathan [name] MRN: [id number] DOB: 1941-02-17 Today's Date: 05/21/2020    History of Present Illness Pt is a 79 y.o. 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  He tested positive for SARS-CoV-2 and received Regeneron and was discharged home.  MD assessment now includes: COPD with acute exacerbation, deconditioning, and Covid-19.    PT Comments    Patient agitated and needs encouragement to participate with PT. Sp02 measured at 90-100% on 1 L 02 with activity. Patient needs Mod A to sit up on edge of bed with cues for task initiation. Mod A for sit to stand transfer with encouragement and cues for participation. Standing tolerance less than 30 seconds with Min A- Mod A to maintain midline with intermittent posterior lean. Patient participated with LE therapeutic exercises in sitting and supine position with encouragement. Recommend to continue PT to maximize function and safety to facilitate independence. SNF is appropriate discharge disposition at this time.    Follow Up Recommendations  SNF     Equipment Recommendations  Other (comment) (to be determinted at next level of care )    Recommendations for Other Services       Precautions / Restrictions Precautions Precautions: Fall Restrictions Weight Bearing Restrictions: No    Mobility  Bed Mobility Overal bed mobility: Needs Assistance Bed Mobility: Supine to Sit;Sit to Supine     Supine to sit: Mod assist Sit to supine: Min assist   General bed mobility comments: Mod for trunk and BLE support. verbal cues for task initiation. Min A for short sitting to supine for LE support.   Transfers Overall transfer level: Needs assistance Equipment used: None Transfers: Sit to/from Stand Sit to Stand: Mod assist         General transfer comment: lifting and lowering  assistance provided during transfers. verbal cues for sequencing and task initiation. needs encouragement to participate with any OOB activity   Ambulation/Gait             General Gait Details: not assessed due to limited standing tolerance and fatigue with activity. questionable if patient is providing full effort with mobility attempts    Stairs             Wheelchair Mobility    Modified Rankin (Stroke Patients Only)       Balance   Sitting-balance support: Feet supported;Bilateral upper extremity supported Sitting balance-Leahy Scale: Fair     Standing balance support: During functional activity;No upper extremity supported Standing balance-Leahy Scale: Poor Standing balance comment: Min A- Mod A required. standing tolerance less than 30 seconds. intermittent posterior loss of balance                             Cognition Arousal/Alertness: Awake/alert Behavior During Therapy: Agitated;Restless Overall Cognitive Status: No family/caregiver present to determine baseline cognitive functioning                                 General Comments: patient is able to follow single step commands with increased time. patient needs encouragement to participate with PT and is agitated during session       Exercises Total Joint Exercises Ankle Circles/Pumps: AROM;Strengthening;Both;10 reps;Supine Heel Slides: AAROM;Strengthening;Both;10 reps;Supine Hip ABduction/ADduction: AROM;Strengthening;Both;10 reps;Supine Long Arc Quad: AAROM;Strengthening;Both;10 reps;Seated Other  Exercises Other Exercises: verbal cues for technique and participation with exercises     General Comments        Pertinent Vitals/Pain Pain Location: pain reported in left shoulder and left fingers. patient does not provide numerical value  Pain Descriptors / Indicators: Sore Pain Intervention(s): Limited activity within patient's tolerance    Home Living                       Prior Function            PT Goals (current goals can now be found in the care plan section) Acute Rehab PT Goals Patient Stated Goal: to return home  PT Goal Formulation: Patient unable to participate in goal setting Time For Goal Achievement: 06/01/20 Potential to Achieve Goals: Fair    Frequency    Min 2X/week      PT Plan Current plan remains appropriate    Co-evaluation              AM-PAC PT "6 Clicks" Mobility   Outcome Measure  Help needed turning from your back to your side while in a flat bed without using bedrails?: A Little Help needed moving from lying on your back to sitting on the side of a flat bed without using bedrails?: A Little Help needed moving to and from a bed to a chair (including a wheelchair)?: A Little Help needed standing up from a chair using your arms (e.g., wheelchair or bedside chair)?: A Little Help needed to walk in hospital room?: A Lot Help needed climbing 3-5 steps with a railing? : Total 6 Click Score: 15    End of Session Equipment Utilized During Treatment: Gait belt;Oxygen Activity Tolerance: Treatment limited secondary to agitation Patient left: in bed;with call bell/phone within reach;with bed alarm set Nurse Communication: Mobility status PT Visit Diagnosis: Unsteadiness on feet (R26.81);Difficulty in walking, not elsewhere classified (R26.2);Muscle weakness (generalized) (M62.81)     Time: 1020-1100 PT Time Calculation (min) (ACUTE ONLY): 40 min  Charges:  $Therapeutic Exercise: 8-22 mins $Therapeutic Activity: 23-37 mins                     Jonathan [name], PT, MPT    Percell Locus 05/21/2020, 11:42 AM

## 2020-05-21 NOTE — Progress Notes (Signed)
    [facility] has been requested to perform a transesophageal echocardiogram on Jonathan [name] for bacteremia.  After careful review of history and examination, the risks and benefits of transesophageal echocardiogram have been explained including risks of esophageal damage, perforation (1:10,000 risk), bleeding, pharyngeal hematoma as well as other potential complications associated with conscious sedation including aspiration, arrhythmia, respiratory failure and death. Alternatives to treatment were discussed, questions were answered. Patient is now willing to proceed. Will schedule for Wednesday 8/25 AM.  Murray Hodgkins, NP  05/21/2020 4:28 PM

## 2020-05-22 ENCOUNTER — Other Ambulatory Visit (INDEPENDENT_AMBULATORY_CARE_PROVIDER_SITE_OTHER): Payer: Self-pay | Admitting: Vascular Surgery

## 2020-05-22 ENCOUNTER — Other Ambulatory Visit: Payer: Medicare Other

## 2020-05-22 LAB — CBC
HCT: 47.6 % (ref 39.0–52.0)
Hemoglobin: 16.2 g/dL (ref 13.0–17.0)
MCH: 29.2 pg (ref 26.0–34.0)
MCHC: 34 g/dL (ref 30.0–36.0)
MCV: 85.9 fL (ref 80.0–100.0)
Platelets: 275 10*3/uL (ref 150–400)
RBC: 5.54 MIL/uL (ref 4.22–5.81)
RDW: 12.8 % (ref 11.5–15.5)
WBC: 21.5 10*3/uL — ABNORMAL HIGH (ref 4.0–10.5)
nRBC: 0 % (ref 0.0–0.2)

## 2020-05-22 LAB — GLUCOSE, CAPILLARY
Glucose-Capillary: 110 mg/dL — ABNORMAL HIGH (ref 70–99)
Glucose-Capillary: 98 mg/dL (ref 70–99)
Glucose-Capillary: 92 mg/dL (ref 70–99)
Glucose-Capillary: 118 mg/dL — ABNORMAL HIGH (ref 70–99)

## 2020-05-22 MED ORDER — INSULIN DETEMIR 100 UNIT/ML ~~LOC~~ SOLN
10.0000 [IU] | Freq: Every day | SUBCUTANEOUS | Status: DC
  Administered 2020-05-22 – 2020-05-29 (×6): 10 [IU] via SUBCUTANEOUS
  Filled 2020-05-22 (×8): qty 0.1

## 2020-05-22 MED ORDER — SODIUM CHLORIDE 0.9 % IV SOLN
12.0000 g | INTRAVENOUS | Status: DC
  Administered 2020-05-22 – 2020-05-28 (×6): 12 g via INTRAVENOUS
  Filled 2020-05-22 (×8): qty 12000

## 2020-05-22 MED ORDER — SODIUM CHLORIDE 0.9 % IV SOLN: INTRAVENOUS | Status: DC

## 2020-05-22 NOTE — Consult Note (Signed)
[facility] Consult Note  MRN : [id number]  EYTAN [name] is a 79 y.o. (March 12, 1941) male who presents with chief complaint of  Chief Complaint  Patient presents with  . Shortness of Breath   History of Present Illness: Of note, the patient is not a good historian.  Information for this consult was obtained through talking to the patient, clinical team members and previous epic notation.  Jonathan [name] is a 79 year old male with medical history significant for hypertension, diabetes myelitis COPD not oxygen dependent who was initially seen in the ED on 05/10/2020 for falls.  He tested positive for SARS-CoV-2 and received Regeneron and was discharged home.  He returned to the emergency room because of recurrent falls and at that time was found to be hypoxic with pulse oximetry of 88% on room air.  Chest x-ray was consistent with his known history of COPD.  He was admitted to the hospital and was treated with remdesivir, Solu-Medrol and supportive care.  He was weaned off oxygen and was said to have ambulated on room air without any further episodes of hypoxia.  He was discharged home on a steroid taper and advised to complete his remdesivir dose as an outpatient.  He presented to the emergency room for evaluation of worsening shortness of breath and fatigue on 05/18/20.   Patient was admitted for COPD with acute exacerbation, COVID-19 viral infection and physical deconditioning.  Physical exam was notable for a left fourth finger to be significantly erythematous and tender. The patient cannot recall a specific traumatic injury or when this may have started.  Vascular surgery was consulted by Dr. Sarajane Jews to rule out any underlying vascular etiology.   Current Facility-Administered Medications  Medication Dose Route Frequency Provider Last Rate Last Admin  . 0.9 %  sodium chloride infusion   Intravenous PRN Little Ishikawa, MD 10 mL/hr at 05/19/20 0507 250  mL at 05/19/20 0507  . albuterol (PROVENTIL) (2.5 MG/3ML) 0.083% nebulizer solution 3 mL  3 mL Inhalation Once PRN Little Ishikawa, MD      . albuterol (VENTOLIN HFA) 108 (90 Base) MCG/ACT inhaler 2 puff  2 puff Inhalation Q6H PRN Agbata, Tochukwu, MD      . aspirin EC tablet 81 mg  81 mg Oral Daily Agbata, Tochukwu, MD   81 mg at 05/22/20 1048  . atorvastatin (LIPITOR) tablet 10 mg  10 mg Oral Daily Agbata, Tochukwu, MD   10 mg at 05/22/20 1048  . ceFAZolin (ANCEF) IVPB 2g/100 mL premix  2 g Intravenous Q8H Hall, Scott A, RPH 200 mL/hr at 05/22/20 1256 2 g at 05/22/20 1256  . diphenhydrAMINE (BENADRYL) injection 50 mg  50 mg Intravenous Once PRN Little Ishikawa, MD      . enoxaparin (LOVENOX) injection 40 mg  40 mg Subcutaneous Q24H Agbata, Tochukwu, MD   40 mg at 05/21/20 1607  . EPINEPHrine (EPI-PEN) injection 0.3 mg  0.3 mg Intramuscular Once PRN Little Ishikawa, MD      . famotidine (PEPCID) IVPB 20 mg premix  20 mg Intravenous Once PRN Little Ishikawa, MD      . insulin aspart (novoLOG) injection 0-15 Units  0-15 Units Subcutaneous TID WC Agbata, Tochukwu, MD   2 Units at 05/21/20 1802  . insulin detemir (LEVEMIR) injection 20 Units  20 Units Subcutaneous QHS Samuella Cota, MD   20 Units at 05/21/20 2145  . mometasone-formoterol (DULERA) 100-5 MCG/ACT inhaler 2 puff  2 puff Inhalation BID Samuella Cota, MD   2 puff at 05/22/20 1049   And  . umeclidinium bromide (INCRUSE ELLIPTA) 62.5 MCG/INH 1 puff  1 puff Inhalation Daily Samuella Cota, MD   1 puff at 05/22/20 1049  . nicotine (NICODERM CQ - dosed in mg/24 hours) patch 21 mg  21 mg Transdermal Daily Agbata, Tochukwu, MD   21 mg at 05/22/20 1049  . oxyCODONE (Oxy IR/ROXICODONE) immediate release tablet 5 mg  5 mg Oral Q6H PRN Lang Snow, NP   5 mg at 05/22/20 0533  . polyethylene glycol (MIRALAX / GLYCOLAX) packet 17 g  17 g Oral BID Samuella Cota, MD   17 g at 05/22/20 1049  .  senna-docusate (Senokot-S) tablet 1 tablet  1 tablet Oral QHS Samuella Cota, MD   1 tablet at 05/21/20 2146   Past Medical History:  Diagnosis Date  . Allergic rhinitis   . Aortic atherosclerosis ([place]) 05/19/2020  . Asthma   . COPD (chronic obstructive pulmonary disease) ([place])   . Dermatitis seborrheica   . Diabetes mellitus type II   . ED (erectile dysfunction)   . Emphysema lung ([place]) 05/19/2020  . Frozen shoulder   . HLD (hyperlipidemia)   . HTN (hypertension)   . Hyperkalemia   . Kidney stone   . Other diseases of lung, not elsewhere classified   . Pulmonary nodule, right    lower lobe  . Rotator cuff injury    right  . Shoulder pain   . Stroke [facility])    pt states "last year"  . Tobacco abuse    Past Surgical History:  Procedure Laterality Date  . CERVICAL DISCECTOMY  1998  . EEG    . ETT  11/1994  . TEE WITHOUT CARDIOVERSION  12/03/2011   Procedure: TRANSESOPHAGEAL ECHOCARDIOGRAM (TEE);  Surgeon: Lelon Perla, MD;  [facility] ENDOSCOPY;  Service: Cardiovascular;  Laterality: N/A;  . TM repair     Social History Social History   Tobacco Use  . Smoking status: Current Every Day Smoker    Packs/day: 1.00    Years: 65.00    Pack years: 65.00    Types: Cigarettes  . Smokeless tobacco: Never Used  Vaping Use  . Vaping Use: Never used  Substance Use Topics  . Alcohol use: No    Alcohol/week: 0.0 standard drinks    Comment: Quit 40 years ago  . Drug use: No   Family History Family History  Problem Relation Age of Onset  . Asthma Sister   . Emphysema Sister   . Diabetes Mother   . Diabetes Brother   . Heart disease Brother   Unable to collect family history due to the patient's mental status.  Allergies  Allergen Reactions  . Amoxicillin-Pot Clavulanate Other (See Comments)    Severe stomach pain.   . Quinapril Hcl Other (See Comments)    back pain  . Valsartan Other (See Comments)    back pain  . Varenicline Tartrate Other (See Comments)     AMS, hallucinations, night mares   REVIEW OF SYSTEMS (Negative unless checked)  Constitutional: [] Weight loss  [] Fever  [] Chills Cardiac: [] Chest pain   [] Chest pressure   [] Palpitations   [x] Shortness of breath when laying flat   [x] Shortness of breath at rest   [x] Shortness of breath with exertion. Vascular:  [] Pain in legs with walking   [] Pain in legs at rest   [] Pain in legs when laying flat   []   Claudication   [] Pain in feet when walking  [] Pain in feet at rest  [] Pain in feet when laying flat   [] History of DVT   [] Phlebitis   [] Swelling in legs   [] Varicose veins   [] Non-healing ulcers Pulmonary:   [] Uses home oxygen   [] Productive cough   [] Hemoptysis   [] Wheeze  [x] COPD   [] Asthma Neurologic:  [] Dizziness  [] Blackouts   [] Seizures   [] History of stroke   [] History of TIA  [] Aphasia   [] Temporary blindness   [] Dysphagia   [] Weakness or numbness in arms   [] Weakness or numbness in legs Musculoskeletal:  [] Arthritis   [] Joint swelling   [] Joint pain   [] Low back pain Hematologic:  [] Easy bruising  [] Easy bleeding   [] Hypercoagulable state   [] Anemic  [] Hepatitis Gastrointestinal:  [] Blood in stool   [] Vomiting blood  [] Gastroesophageal reflux/heartburn   [] Difficulty swallowing. Genitourinary:  [] Chronic kidney disease   [] Difficult urination  [] Frequent urination  [] Burning with urination   [] Blood in urine Skin:  [] Rashes   [] Ulcers   [] Wounds Psychological:  [] History of anxiety   []  History of major depression.  Positive for left fourth finger erythema and discomfort.  Physical Examination  Vitals:   05/22/20 0001 05/22/20 0535 05/22/20 0817 05/22/20 1237  BP: (!) 120/56 (!) 111/59 (!) 163/84 (!) 124/46  Pulse:   (!) 101 100  Resp:  19 18 18   Temp: 97.8 F (36.6 C) 97.8 F (36.6 C) 98 F (36.7 C) 97.6 F (36.4 C)  TempSrc: Oral Oral Oral Oral  SpO2: 91% 94% 94% 92%  Weight:      Height:       Body mass index is 26.12 kg/m. Gen:  WD/WN, NAD Head: [place]/AT, No temporalis  wasting. Prominent temp pulse not noted. Ear/Nose/Throat: Hearing grossly intact, nares w/o erythema or drainage, oropharynx w/o Erythema/Exudate Eyes: Sclera non-icteric, conjunctiva clear Neck: Trachea midline.  No JVD.  Pulmonary:  Good air movement, respirations not labored, equal bilaterally.  Cardiac: RRR, normal S1, S2. Vascular:  Vessel Right Left  Radial Palpable Palpable  Ulnar Palpable Palpable  Brachial Palpable Palpable                           Left upper extremity:   2+ radial pulse palpated. Fingers are warm.  Very tender to palpation fourth finger.  Erythematous.  Motor/sensory is intact.  Skin is intact.  No wounds.   Gastrointestinal: soft, non-tender/non-distended. No guarding/reflex.  Musculoskeletal: M/S 5/5 throughout.  Extremities without ischemic changes.  No deformity or atrophy. No edema. Neurologic: Sensation grossly intact in extremities.  Symmetrical.  Speech is fluent. Motor exam as listed above. Psychiatric: Judgment intact, Mood & affect appropriate for pt's clinical situation. Dermatologic: No rashes or ulcers noted.  No cellulitis or open wounds. Lymph : No Cervical, Axillary, or Inguinal lymphadenopathy.  CBC Lab Results  Component Value Date   WBC 21.5 (H) 05/22/2020   HGB 16.2 05/22/2020   HCT 47.6 05/22/2020   MCV 85.9 05/22/2020   PLT 275 05/22/2020   BMET    Component Value Date/Time   NA 136 05/19/2020 0510   K 3.1 (L) 05/19/2020 0510   CL 92 (L) 05/19/2020 0510   CO2 31 05/19/2020 0510   GLUCOSE 204 (H) 05/19/2020 0510   BUN 23 05/19/2020 0510   CREATININE 0.91 05/19/2020 0510   CREATININE 1.11 03/30/2020 1528   CALCIUM 8.6 (L) 05/19/2020 0510   GFRNONAA >60 05/19/2020 0510  GFRNONAA 65 05/17/2019 1557   GFRAA >60 05/19/2020 0510   GFRAA 75 05/17/2019 1557   Estimated Creatinine Clearance: 57.3 mL/min (by C-G formula based on SCr of 0.91 mg/dL).  COAG Lab Results  Component Value Date   INR 1.04 11/28/2013   INR  0.9 09/08/2007   Radiology DG Chest 1 View  Result Date: 05/20/2020 CLINICAL DATA:  Airway aspiration EXAM: CHEST  1 VIEW COMPARISON:  Two days ago FINDINGS: Interstitial coarsening that is unchanged. Emphysema and hyperinflation. Nodular density at the right base which correlates with a rib fracture by CT. Additional pulmonary nodule in the right lower lobe on recent chest CT. Normal heart size when allowing for rotation. IMPRESSION: COPD without acute superimposed finding, reference chest CT from 2 days ago. Electronically Signed   By: Monte Fantasia M.D.   On: 05/20/2020 07:38   CT ANGIO CHEST PE W OR WO CONTRAST  Result Date: 05/18/2020 CLINICAL DATA:  Increasing shortness of breath and weakness, COVID-19 diagnosis 5 days ago EXAM: CT ANGIOGRAPHY CHEST WITH CONTRAST TECHNIQUE: Multidetector CT imaging of the chest was performed using the standard protocol during bolus administration of intravenous contrast. Multiplanar CT image reconstructions and MIPs were obtained to evaluate the vascular anatomy. CONTRAST:  74mL OMNIPAQUE IOHEXOL 350 MG/ML SOLN COMPARISON:  05/18/2020, 12/10/2017 FINDINGS: Cardiovascular: This is a technically adequate evaluation of the pulmonary vasculature. No filling defects or pulmonary emboli. The heart is not enlarged. No pericardial effusion. Extensive atherosclerosis throughout the coronary vasculature unchanged. Normal caliber of the thoracic aorta, with minimal atherosclerosis of the aortic arch. Mediastinum/Nodes: No enlarged mediastinal, hilar, or axillary lymph nodes. Thyroid gland, trachea, and esophagus demonstrate no significant findings. Lungs/Pleura: There is severe background emphysema, upper lobe predominant. A spiculated nodule within the superior segment of the right lower lobe measures 1.4 by 1.0 by 1.3 cm, highly concerning for pulmonary malignancy. PET-CT is recommended for further evaluation. There is a 0.6 cm nodule within the lateral basilar segment of  the right lower lobe reference image 74/6, stable since prior exam and benign. No other areas of consolidation, effusion, or pneumothorax. Prominent bronchial wall thickening within the left lower lobe consistent with bronchitis. Otherwise the central airways are patent. Upper Abdomen: Nonspecific nodular thickening of the bilateral adrenal glands, measuring up to 11 mm on the right and 11 mm on the left, slightly more pronounced than prior study. Statistically this stool likely reflects hyperplasia or adenoma, though could be evaluated at the time of follow-up PET CT. High attenuation material within the gallbladder consistent with noncalcified stones or sludge. No evidence of cholecystitis. Musculoskeletal: There is a minimally displaced right anterolateral seventh rib fracture. There is a small amount of subcutaneous gas within the adjacent soft tissues, as well as a small hematoma surrounding the fracture line and extending into the pleural space, measuring up to 2 cm in thickness. No evidence of hemothorax or pneumothorax. No other acute bony abnormalities. Reconstructed images demonstrate no additional findings. Review of the MIP images confirms the above findings. IMPRESSION: 1. 1.4 cm spiculated right lower lobe mass, highly concerning for bronchogenic malignancy. PET-CT is recommended for further evaluation. 2. No evidence of pulmonary embolus. 3. Minimally displaced right anterolateral seventh rib fracture, with small surrounding hematoma. No evidence of hemothorax or pneumothorax. 4. Gallbladder sludge, no evidence of cholecystitis. 5. Nonspecific nodular thickening of the adrenal glands. This could be evaluated at the time of follow-up PET CT. 6. Aortic Atherosclerosis (ICD10-I70.0) and Emphysema (ICD10-J43.9). Electronically Signed   By: Legrand Como  Owens Shark M.D.   On: 05/18/2020 16:40   DG Chest Port 1 View  Result Date: 05/18/2020 CLINICAL DATA:  Shortness of breath EXAM: PORTABLE CHEST 1 VIEW  COMPARISON:  May 11, 2020 FINDINGS: There is mild interstitial thickening bilaterally. No edema or airspace opacity. Heart size and pulmonary vascularity are normal. No adenopathy. There is aortic atherosclerosis. No bone lesions IMPRESSION: Mild interstitial thickening bilaterally, probably indicative of a degree of chronic bronchitis. No edema or airspace opacity. Heart size within normal limits. Aortic Atherosclerosis (ICD10-I70.0). Electronically Signed   By: Lowella Grip III M.D.   On: 05/18/2020 12:34   DG Chest Portable 1 View  Result Date: 05/11/2020 CLINICAL DATA:  79 year old male positive COVID-19.  Cough. EXAM: PORTABLE CHEST 1 VIEW COMPARISON:  Portable chest 05/10/2020 and earlier. FINDINGS: Portable AP semi upright view at 0312 hours. Stable lung volumes and mediastinal contours. Asymmetric attenuation of left upper lobe bronchovascular markings in keeping with emphysema. No superimposed pneumothorax, pulmonary edema, pleural effusion, or confluent pulmonary opacity. No acute osseous abnormality identified. IMPRESSION: Emphysema (ICD10-J43.9).  No acute cardiopulmonary abnormality. Electronically Signed   By: Genevie Ann M.D.   On: 05/11/2020 03:32   Portable chest 1 View  Result Date: 05/10/2020 CLINICAL DATA:  Productive cough for several days. EXAM: PORTABLE CHEST 1 VIEW COMPARISON:  12/09/2017 FINDINGS: 0956 hours. Low volumes. Cardiopericardial silhouette is at upper limits of normal for size. The lungs are clear without focal pneumonia, edema, pneumothorax or pleural effusion. The visualized bony structures of the thorax show now acute abnormality. IMPRESSION: Low volume film without acute cardiopulmonary findings. Electronically Signed   By: Misty Stanley M.D.   On: 05/10/2020 10:18   DG Shoulder Left Port  Result Date: 05/19/2020 CLINICAL DATA:  Left shoulder pain, unable to provide history. EXAM: LEFT SHOULDER COMPARISON:  Chest CT 12/10/2017 FINDINGS: The osseous structures  appear diffusely demineralized which may limit detection of small or nondisplaced fractures. Corticated fragment along the anteroinferior glenoid could reflect sequela of prior glenoid labral injury or intra-articular bodies. Background of mild glenohumeral arthrosis. High-riding appearance of the humeral head moderate acromioclavicular arthrosis is present as well. Acromioclavicular alignment is maintained albeit with some focal soft tissue thickening superficial to the acromioclavicular joint. Recommend correlation for point tenderness to assess for a [place] type 1 acromioclavicular injury. Remaining soft tissues are unremarkable. IMPRESSION: 1. Focal soft tissue thickening superficial to the acromioclavicular joint. Recommend correlation for point tenderness for the exclusion of a [place] type 1 acromioclavicular injury. 2. Corticated fragment along the anteroinferior glenoid could reflect sequela of prior glenoid labral injury or intra-articular bodies. 3. Mild to moderate glenohumeral and acromioclavicular arthrosis. 4. High-riding humeral head could reflect chronic underlying rotator cuff tendinopathy given presence on comparison. Electronically Signed   By: Lovena Le M.D.   On: 05/19/2020 19:48   DG Finger Ring Left  Result Date: 05/20/2020 CLINICAL DATA:  Left ring finger pain, swelling, and redness. EXAM: LEFT RING FINGER 2+V COMPARISON:  None. FINDINGS: No soft tissue gas identified. A soft tissue calcification adjacent to the proximal aspect of the distal third phalanx is likely from trauma. The soft tissue calcifications well corticated and I suspect this is nonacute. No other bony abnormalities. IMPRESSION: A soft tissue calcification adjacent to the proximal aspect of the distal third phalanx is likely posttraumatic. While the finding is age indeterminate, I suspected is nonacute. No other abnormalities. Electronically Signed   By: Dorise Bullion III M.D   On: 05/20/2020 15:41  ECHOCARDIOGRAM COMPLETE  Result Date: 05/19/2020    ECHOCARDIOGRAM REPORT   Patient Name:   BARNET BENAVIDES Date of Exam: 05/19/2020 Medical Rec #:  [id number]       Height:       65.0 in Accession #:    [id number]      Weight:       157.0 lb Date of Birth:  Aug 15, 1941       BSA:          1.785 m Patient Age:    47 years        BP:           190/75 mmHg Patient Gender: M               HR:           56 bpm. Exam Location:  [facility] Procedure: 2D Echo, Cardiac Doppler and Color Doppler Indications:     CHF- acute diastolic 235.57  History:         Patient has no prior history of Echocardiogram examinations.                  COPD; Risk Factors:Hypertension and Dyslipidemia. Tobacco                  abuse.  Sonographer:     Sherrie Sport RDCS (AE) Referring Phys:  [place] Diagnosing Phys: Kathlyn Sacramento MD  Sonographer Comments: Technically difficult study due to poor echo windows, no apical window and no parasternal window. IMPRESSIONS  1. Left ventricular ejection fraction, by estimation, is 55 to 60%. The left ventricle has normal function. Left ventricular endocardial border not optimally defined to evaluate regional wall motion. There is mild left ventricular hypertrophy. Left ventricular diastolic function could not be evaluated.  2. Right ventricular systolic function is normal. The right ventricular size is normal. Tricuspid regurgitation signal is inadequate for assessing PA pressure.  3. Left atrial size was mildly dilated.  4. The mitral valve is normal in structure. No evidence of mitral valve regurgitation. No evidence of mitral stenosis.  5. The aortic valve is normal in structure. Aortic valve regurgitation is not visualized. No aortic stenosis is present.  6. challenging image quality with limited views. FINDINGS  Left Ventricle: Left ventricular ejection fraction, by estimation, is 55 to 60%. The left ventricle has normal function. Left ventricular endocardial border not optimally defined to  evaluate regional wall motion. The left ventricular internal cavity size was normal in size. There is mild left ventricular hypertrophy. Left ventricular diastolic function could not be evaluated. Right Ventricle: The right ventricular size is normal. No increase in right ventricular wall thickness. Right ventricular systolic function is normal. Tricuspid regurgitation signal is inadequate for assessing PA pressure. Left Atrium: Left atrial size was mildly dilated. Right Atrium: Right atrial size was normal in size. Pericardium: There is no evidence of pericardial effusion. Mitral Valve: The mitral valve is normal in structure. Normal mobility of the mitral valve leaflets. No evidence of mitral valve regurgitation. No evidence of mitral valve stenosis. Tricuspid Valve: The tricuspid valve is normal in structure. Tricuspid valve regurgitation is not demonstrated. No evidence of tricuspid stenosis. Aortic Valve: The aortic valve is normal in structure. Aortic valve regurgitation is not visualized. No aortic stenosis is present. Pulmonic Valve: The pulmonic valve was normal in structure. Pulmonic valve regurgitation is not visualized. No evidence of pulmonic stenosis. Aorta: The aortic root is normal in size and structure. Venous: The  inferior vena cava was not well visualized. IAS/Shunts: No atrial level shunt detected by color flow Doppler.  LEFT VENTRICLE PLAX 2D LVIDd:         3.85 cm LVIDs:         2.76 cm LV PW:         1.23 cm LV IVS:        1.23 cm  LEFT ATRIUM         Index LA diam:    4.30 cm 2.41 cm/m                        PULMONIC VALVE AORTA                 PV Vmax:        0.69 m/s Ao Root diam: 2.50 cm PV Peak grad:   1.9 mmHg                       RVOT Peak grad: 6 mmHg  Kathlyn Sacramento MD Electronically signed by Kathlyn Sacramento MD Signature Date/Time: 05/19/2020/11:22:42 AM    Final    Assessment/Plan The patient is a 79 year old male with multiple medical issues including recent diagnosis with  Covid, weakness and deconditioning found to have left fourth finger erythema and discomfort  1.  Possible left fourth finger ischemia: Physical exam notable for significant tenderness and erythema to the left fourth finger.  Able to palpate a radial pulse however there is bluish discoloration to the base of the finger as well as erythema distally.  Very tender to palpation.  Motor function is preserved at this time.  Possible cardiac atheroemboli?  Recommend undergoing a left upper extremity angiogram with possible intervention and attempt assess the patient's anatomy and assess for atherosclerotic disease versus emboli / Covid? I did my best to explain the situation to the patient in regard to the risk benefits and the procedure itself.  Patient states he would like to proceed.  Plan on angiogram tomorrow with Dr. Lucky Cowboy.  2. Active COVID 19: Airborne precautions Given monoclonal antibodies initially  Treated with Remdesivir Followed by infectious disease  3. Tobacco Abuse: We had a discussion for approximately three minutes regarding the absolute need for smoking cessation due to the deleterious nature of tobacco on the vascular system. We discussed the tobacco use would diminish patency of any intervention, and likely significantly worsen progressio of disease. We discussed multiple agents for quitting including replacement therapy or medications to reduce cravings such as Chantix. The patient voices their understanding of the importance of smoking cessation.  Discussed with Dr. Mayme Genta, PA-C  05/22/2020 2:42 PM  This note was created with Dragon medical transcription system.  Any error is purely unintentional

## 2020-05-22 NOTE — Consult Note (Signed)
ORTHOPAEDIC CONSULTATION  REQUESTING PHYSICIAN: Samuella Cota, MD  Chief Complaint:   Left ring finger pain and swelling  History of Present Illness: The patient is a poor historian and history was obtained from discussion with the patient, primary team, and chart review.  Jonathan [name] is a 79 y.o. male Currently admitted for staph aureus bacteremia.  Of note, he was recently admitted to [facility] long ED on 05/10/2020 with COVID.  He was treated with monoclonal antibodies and remdesivir.  He represented to [facility] on 05/18/2020 after discharge due to increased shortness of breath, weakness, and feeling unwell.  There has been no obvious source of bacteremia to date.  The patient has significant erythema and tenderness about the left ring finger.  The patient cannot recall a specific traumatic injury or when this may have started.  This was first noted, per chart review, 2 days ago, at which point he had significant tenderness and circumferential erythema without notable abscess or fluid collection on exam.  Past Medical History:  Diagnosis Date  . Allergic rhinitis   . Aortic atherosclerosis ([place]) 05/19/2020  . Asthma   . COPD (chronic obstructive pulmonary disease) ([place])   . Dermatitis seborrheica   . Diabetes mellitus type II   . ED (erectile dysfunction)   . Emphysema lung ([place]) 05/19/2020  . Frozen shoulder   . HLD (hyperlipidemia)   . HTN (hypertension)   . Hyperkalemia   . Kidney stone   . Other diseases of lung, not elsewhere classified   . Pulmonary nodule, right    lower lobe  . Rotator cuff injury    right  . Shoulder pain   . Stroke [facility])    pt states "last year"  . Tobacco abuse    Past Surgical History:  Procedure Laterality Date  . CERVICAL DISCECTOMY  1998  . EEG    . ETT  11/1994  . TEE WITHOUT CARDIOVERSION  12/03/2011   Procedure: TRANSESOPHAGEAL ECHOCARDIOGRAM (TEE);  Surgeon: Lelon Perla,  MD;  Location: [facility] ENDOSCOPY;  Service: Cardiovascular;  Laterality: N/A;  . TM repair     Social History   Socioeconomic History  . Marital status: Married    Spouse name: Not on file  . Number of children: Not on file  . Years of education: Not on file  . Highest education level: Not on file  Occupational History  . Occupation: Retired    Comment: Air cabin crew  Tobacco Use  . Smoking status: Current Every Day Smoker    Packs/day: 1.00    Years: 65.00    Pack years: 65.00    Types: Cigarettes  . Smokeless tobacco: Never Used  Vaping Use  . Vaping Use: Never used  Substance and Sexual Activity  . Alcohol use: No    Alcohol/week: 0.0 standard drinks    Comment: Quit 40 years ago  . Drug use: No  . Sexual activity: Never    Partners: Female  Other Topics Concern  . Not on file  Social History Narrative  . Not on file   Social Determinants of Health   Financial Resource Strain:   . Difficulty of Paying Living Expenses: Not on file  Food Insecurity:   . Worried About Charity fundraiser in the Last Year: Not on file  . Ran Out of Food in the Last Year: Not on file  Transportation Needs:   . Lack of Transportation (Medical): Not on file  . Lack of Transportation (Non-Medical): Not on file  Physical Activity:   . Days of Exercise per Week: Not on file  . Minutes of Exercise per Session: Not on file  Stress:   . Feeling of Stress : Not on file  Social Connections:   . Frequency of Communication with Friends and Family: Not on file  . Frequency of Social Gatherings with Friends and Family: Not on file  . Attends Religious Services: Not on file  . Active Member of Clubs or Organizations: Not on file  . Attends Archivist Meetings: Not on file  . Marital Status: Not on file   Family History  Problem Relation Age of Onset  . Asthma Sister   . Emphysema Sister   . Diabetes Mother   . Diabetes Brother   . Heart disease Brother    Allergies  Allergen  Reactions  . Amoxicillin-Pot Clavulanate Other (See Comments)    Severe stomach pain.   . Quinapril Hcl Other (See Comments)    back pain  . Valsartan Other (See Comments)    back pain  . Varenicline Tartrate Other (See Comments)    AMS, hallucinations, night mares   Prior to Admission medications   Medication Sig Start Date End Date Taking? Authorizing Provider  ACCU-CHEK GUIDE test strip USE AS INSTRUCTED TO TEST 4 TIMES DAILY DX:E11.65 06/27/19   Philemon Kingdom, MD  albuterol (PROVENTIL) (2.5 MG/3ML) 0.083% nebulizer solution Take 3 mLs (2.5 mg total) every 4 (four) hours as needed by nebulization for shortness of breath. 08/06/17   Doreatha Lew, MD  albuterol (VENTOLIN HFA) 108 (90 Base) MCG/ACT inhaler Inhale 2 puffs into the lungs every 6 (six) hours as needed for wheezing. 04/09/20   Tower, Wynelle Fanny, MD  aspirin 81 MG tablet Take 1 tablet (81 mg total) by mouth daily. 11/26/13   Ghimire, Henreitta Leber, MD  atorvastatin (LIPITOR) 10 MG tablet Take 1 tablet (10 mg total) by mouth daily. 04/09/20   Tower, Wynelle Fanny, MD  Budeson-Glycopyrrol-Formoterol (BREZTRI AEROSPHERE) 160-9-4.8 MCG/ACT AERO Inhale 2 puffs into the lungs 2 (two) times daily.    [provider]  HUMALOG KWIKPEN 100 UNIT/ML KwikPen INJECT 8-14 UNITS INTO THE SKIN 3 TIMES DAILY. Patient taking differently: Inject 5-16 Units into the skin See admin instructions. Inject 3 times a day with meals and as needed with evening snack: - 10 units with a smaller meal - 12-14 units with a regular meal - 16 units with a larger meal - 5-6 units with evening snack 09/06/19   Philemon Kingdom, MD  insulin glargine (LANTUS) 100 UNIT/ML Solostar Pen Inject 42 Units into the skin at bedtime. 04/10/20   Philemon Kingdom, MD  Insulin Pen Needle (BD PEN NEEDLE NANO U/F) 32G X 4 MM MISC Use to inject insulin 4 times a day. 10/20/18   Philemon Kingdom, MD  Lancets Misc. (ACCU-CHEK FASTCLIX LANCET) KIT 1 each by Does not apply route QID.  DX: E11.65 06/07/18   Philemon Kingdom, MD  metFORMIN (GLUCOPHAGE) 1000 MG tablet TAKE ONE TABLET BY MOUTH  TWICE DAILY Patient taking differently: Take 1,000 mg by mouth 2 (two) times daily with a meal.  01/17/20   Philemon Kingdom, MD  predniSONE (DELTASONE) 10 MG tablet Take 4 tablets (40 mg total) by mouth daily for 3 days, THEN 3 tablets (30 mg total) daily for 3 days, THEN 2 tablets (20 mg total) daily for 3 days, THEN 1 tablet (10 mg total) daily for 3 days. 05/14/20 05/26/20  Little Ishikawa, MD  Recent Labs    05/20/20 0623 05/21/20 0537 05/22/20 0435  WBC 28.5* 28.0* 21.5*  HGB 16.5 17.8* 16.2  HCT 47.1 52.4* 47.6  PLT 305 289 275   DG Finger Ring Left  Result Date: 05/20/2020 CLINICAL DATA:  Left ring finger pain, swelling, and redness. EXAM: LEFT RING FINGER 2+V COMPARISON:  None. FINDINGS: No soft tissue gas identified. A soft tissue calcification adjacent to the proximal aspect of the distal third phalanx is likely from trauma. The soft tissue calcifications well corticated and I suspect this is nonacute. No other bony abnormalities. IMPRESSION: A soft tissue calcification adjacent to the proximal aspect of the distal third phalanx is likely posttraumatic. While the finding is age indeterminate, I suspected is nonacute. No other abnormalities. Electronically Signed   By: Dorise Bullion III M.D   On: 05/20/2020 15:41     Positive ROS: All other systems have been reviewed and were otherwise negative with the exception of those mentioned in the HPI and as above.  Physical Exam: BP (!) 124/46 (BP Location: Right Arm)   Pulse 100   Temp 97.6 F (36.4 C) (Oral)   Resp 18   Ht 5' 5"  (1.651 m)   Wt 71.2 kg   SpO2 92%   BMI 26.12 kg/m  General:  Alert, no acute distress Psychiatric:  Patient is competent for consent with normal mood and affect   Cardiovascular:  No pedal edema, regular rate and rhythm Respiratory:  No wheezing, non-labored breathing GI:  Abdomen is soft  and non-tender Skin:  No lesions in the area of chief complaint, no erythema Neurologic:  Sensation intact distally, CN grossly intact Lymphatic:  No axillary or cervical lymphadenopathy  Orthopedic Exam:  LUE: +ain/pin/u motor SILT r/u/m/ax +rad pulse Significant circumferential erythema about the entirety of the distal phalanx and extending over a majority of the proximal phalanx.  There is significant tenderness to palpation about the DIP joint both radially and ulnarly.  There is some mild soft tissue swelling.  However, there is no significant fluctuance of the finger pad or about the nailbed when compared to the adjacent fingers.  No significant tenderness about proximal flexor tendon sheath.  No fusiform swelling.   X-rays:  As above: Avulsion type fracture of the volar and radial aspect of the base of the distal phalanx.  This may be a subacute fracture.   Assessment/Plan: 79 year old male admitted for MSSA bacteremia with unclear etiology.  The patient was also recently admitted for Covid and treated with monoclonal antibodies and remdesivir.  He currently has significant left index finger pain and erythema.  Patient symptoms about the ring finger seem to be from an unclear etiology.  Differential diagnosis includes infection, acute fracture, possible connective tissue disorder, unexplained Covid etiology, versus alternative etiology.  1. The patient does have significant tenderness about the DIP joint both radially and ulnarly which could be consistent with acute fracture, but there does appear to be some cortication on the radiograph suggestive of more subacute process.  Additionally, the circumferential erythema is not explained by this fracture.  2. The patient may have a soft tissue infection, and the patient has been on antibiotics for this without significant improvement.  However, although there is some soft tissue swelling, there does not appear to be abscess or fluid  collection on exam amenable to drainage.  3.  Based on the unclear diagnosis, I recommend continued IV antibiotics in the event that this is an infectious etiology.  Can also consider  immobilization of the finger with a splint.  4.  We will plan to reevaluate tomorrow for any change in exam.1:26 PM     Leim Fabry   05/22/2020

## 2020-05-22 NOTE — Progress Notes (Signed)
Pharmacy Antibiotic Note  Jonathan [name] is a 79 y.o. male admitted on 05/18/2020 with MSSA bacteremia.  Pharmacy has been consulted for cefazolin dosing. ID Following.   Planning for TEE Wednesday 8/25 AM.   Plan: Day 4 IV antibiotics. Continue Cefazolin  2gm IV q8hrs  Height: 5\' 5"  (165.1 cm) Weight: 71.2 kg (156 lb 15.5 oz) IBW/kg (Calculated) : 61.5  Temp (24hrs), Avg:97.7 F (36.5 C), Min:97.4 F (36.3 C), Max:98 F (36.7 C)  Recent Labs  Lab 05/18/20 1220 05/18/20 1644 05/19/20 0510 05/20/20 0623 05/21/20 0537 05/22/20 0435  WBC 24.2*  --  29.1* 28.5* 28.0* 21.5*  CREATININE 1.05  --  0.91  --   --   --   LATICACIDVEN 2.6* 2.2*  --   --   --   --     Estimated Creatinine Clearance: 57.3 mL/min (by C-G formula based on SCr of 0.91 mg/dL).    Allergies  Allergen Reactions  . Amoxicillin-Pot Clavulanate Other (See Comments)    Severe stomach pain.   . Quinapril Hcl Other (See Comments)    back pain  . Valsartan Other (See Comments)    back pain  . Varenicline Tartrate Other (See Comments)    AMS, hallucinations, night mares    Antimicrobials this admission:  8/20 Cefazolin  >>   Microbiology results:  BCx: MSSA  Thank you for allowing pharmacy to be a part of this patient's care.  Pernell Dupre, PharmD, BCPS Clinical Pharmacist 05/22/2020 10:22 AM

## 2020-05-22 NOTE — Progress Notes (Addendum)
PROGRESS NOTE  Jonathan [name] JAS:[id number] DOB: 03-27-1941 DOA: 05/18/2020 PCP: Abner Greenspan, MD  Brief History   79 year old male who was recently discharged from the hospital after treatment for COVID-19 viral infection.  Presented to ED for evaluation of worsening shortness of breath, nonproductive cough and wheezing and was found to be hypoxic with room air pulse oximetry at rest of 86%.  Admitted for COPD exacerbation in the context of recent Covid infection.  COPD exacerbation resolved overnight.  Was found to have MSSA bacteremia which has become the dominant reason for ongoing hospitalization.  Initially refused TEE 8/23, but now agreeable to TEE 8/25.  Source of bacteremia unclear, could be phenomenon related to Covid.  Developed spontaneous left ring finger erythema distally of unclear significance.  Orthopedics following.  Seen by orthopedics, infectious disease and vascular surgery.  Plan for angiogram 8/25 to exclude vascular pathology.  . Admit 8/13 to 8/16 for COVID tx w/ remdesivir, steroids. Discharged on steroid taper and compelte outpt remdesivir.  . 8/13 ED, hypoxic, admitted for COVID . 8/12 ED, COVID+, received MAB and was d/c from ED   A & P  Bacteremia due to Staphylococcus aureus --remains afebrile, vital signs stable. --sepsis ruled out --Source remains unclear.  Remote neck surgery.  No other hardware.  No drug use.  Skin appears unremarkable except for distal left ring finger, which has circumferential erythema. Tender but no abscess or lesions. Xray probably nonacute.  --We will continue cefazolin.  Repeat cultures no growth to date.  Infectious disease following, appreciated. --2D echocardiogram unrevealing.  TEE planned 8/25.  Finger pain, left --xray nonacute, etiology unclear; may be Covid phenomenon,  --Appreciate orthopedic evaluation.  Will continue to follow.  Etiology unclear. --Appreciate vascular surgery evaluation, plan for angiogram tomorrow to  exclude underlying vascular pathology.  Type 2 diabetes mellitus with hyperglycemia, with long-term current use of insulin (HCC) --Stable.  Will decrease long-acting insulin tonight in anticipation of n.p.o. status tomorrow.  COPD with acute exacerbation ([place]) with acute hypoxic respiratory failure --resolved. On RA --Continue bronchodilators.  Continue supplemental oxygen.  COVID-19 virus infection --Status post treatment last hospitalization with remdesivir and steroids.  Appears asymptomatic at this time.  Also received monoclonal antibody infusion 8/12. --Asymptomatic.  Closed rib fracture --Minimally displaced right anterolateral seventh rib fracture, with small surrounding hematoma. No evidence of hemothorax or pneumothorax.  Monitor clinically. 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 PET-CT is recommended for further Evaluation. --f/u as outpt -- I discussed w/ wife and daughter by telephone 8/22  Aortic atherosclerosis ([place]) --continue statin  Emphysema lung ([place]) --as per COPD  Disposition Plan:  Discussion: Continue to treat MSSA bacteremia and investigate source.  TEE tomorrow.  Appreciate orthopedic and vascular evaluations.  Angiogram tomorrow.  Status is: Inpatient  Remains inpatient appropriate because:IV treatments appropriate due to intensity of illness or inability to take PO  Dispo: The patient is from: Home              Anticipated d/c is to: Home              Anticipated d/c date is: 3 days              Patient currently is not medically stable to d/c.  DVT prophylaxis: enoxaparin (LOVENOX) injection 40 mg Start: 05/18/20 1630  Code Status: Full Family Communication: updated wife by telephone  Murray Hodgkins, MD  [facility]  Direct contact: see [email or web address] (further directions at bottom of note if needed) 7PM-7AM contact night coverage as at bottom of  note 05/22/2020, 6:00 PM  LOS: 4 days   Significant Hospital Events   . 8/20 admit   Consults:  . ID 8/23 . Orthopedics . Vascular surgery   Procedures:  .   Significant Diagnostic Tests:  . TTE unremarkable   Micro Data:  . 8/20 BC > G[facility] ID MSSA    Antimicrobials:  . Cefazolin 8/2 >  Interval History/Subjective  Feels ok today; shoulder not bothering him. Breathing ok. Still has left finger pain.  Objective   Vitals:  Vitals:   05/22/20 1237 05/22/20 1753  BP: (!) 124/46 (!) 150/70  Pulse: 100 86  Resp: 18 18  Temp: 97.6 F (36.4 C) 97.7 F (36.5 C)  SpO2: 92% 92%    Exam: Constitutional:   . Appears calm and comfortable today lying on left side in the bed ENMT:  . grossly normal hearing  Respiratory:  . CTA bilaterally, no w/r/r.  . Respiratory effort normal. Cardiovascular:  . RRR, no m/r/g . No LE extremity edema   Musculoskeletal:  . Digits/nails BUE: no clubbing, cyanosis, petechiae, infection right hand . Left ring finger w/ erythema distally, very tender to touch, w/o much change compared to 8/23. Some skin peeling, no fluctuance Skin:  . No rashes, lesions, ulcers other than above. Back, legs, perineum, anus appear unremarkable Psychiatric:  . Mental status o Mood, affect appropriate  I have personally reviewed the following:   Today's Data  . CBG stable last 24 hours . WBC down to 21.5 . Repeat BC pending NGTD  Scheduled Meds: . aspirin EC  81 mg Oral Daily  . atorvastatin  10 mg Oral Daily  . enoxaparin (LOVENOX) injection  40 mg Subcutaneous Q24H  . insulin aspart  0-15 Units Subcutaneous TID WC  . insulin detemir  10 Units Subcutaneous QHS  . mometasone-formoterol  2 puff Inhalation BID   And  . umeclidinium bromide  1 puff Inhalation Daily  . nicotine  21 mg Transdermal Daily  . polyethylene glycol  17 g Oral BID  . senna-docusate  1 tablet Oral QHS   Continuous Infusions: . sodium chloride 250 mL (05/19/20 0507)  . [START  ON 05/23/2020] sodium chloride    . famotidine (PEPCID) IV    . nafcillin (NAFCIL) continuous infusion 12 g (05/22/20 1746)    Principal Problem:   Bacteremia due to Staphylococcus aureus Active Problems:   Finger pain, left   Type 2 diabetes mellitus with hyperglycemia, with long-term current use of insulin (HCC)   COVID-19 virus infection   COPD with acute exacerbation (HCC)   Closed rib fracture   Lung mass   Tobacco abuse   Physical deconditioning   Aortic atherosclerosis (HCC)   Emphysema lung ([place])   LOS: 4 days   How to contact the [facility] Attending or Consulting provider 7A - 7P or covering provider during after hours [place], for this patient?  1. Check the care team in [facility] and look for a) attending/consulting TR[facility] provider listed and b) the [facility] team listed 2. Log into [email or web address] and use [place]'s universal password to access. If you do not have the password, please contact the hospital operator. 3. Locate the [facility] provider you are looking for under [facility] and page to a number that you can be directly reached. 4. If you still have difficulty reaching the provider, please page the [facility] (  Director on Call) for the Hospitalists listed on amion for assistance.

## 2020-05-22 NOTE — H&P (View-Only) (Signed)
[facility] Vascular Consult Note  MRN : [id number]  Jonathan [name] is a 79 y.o. ([birth year]) male who presents with chief complaint of  Chief Complaint  Patient presents with   Shortness of Breath   History of Present Illness: Of note, the patient is not a good historian.  Information for this consult was obtained through talking to the patient, clinical team members and previous epic notation.  Jonathan [name] is a 79 year old male with medical history significant for hypertension, diabetes myelitis COPD not oxygen dependent who was initially seen in the ED on 05/10/2020 for falls.  He tested positive for SARS-CoV-2 and received Regeneron and was discharged home.  He returned to the emergency room because of recurrent falls and at that time was found to be hypoxic with pulse oximetry of 88% on room air.  Chest x-ray was consistent with his known history of COPD.  He was admitted to the hospital and was treated with remdesivir, Solu-Medrol and supportive care.  He was weaned off oxygen and was said to have ambulated on room air without any further episodes of hypoxia.  He was discharged home on a steroid taper and advised to complete his remdesivir dose as an outpatient.  He presented to the emergency room for evaluation of worsening shortness of breath and fatigue on 05/18/20.   Patient was admitted for COPD with acute exacerbation, COVID-19 viral infection and physical deconditioning.  Physical exam was notable for a left fourth finger to be significantly erythematous and tender. The patient cannot recall a specific traumatic injury or when this may have started.  Vascular surgery was consulted by Dr. Sarajane Jews to rule out any underlying vascular etiology.   Current Facility-Administered Medications  Medication Dose Route Frequency Provider Last Rate Last Admin   0.9 %  sodium chloride infusion   Intravenous PRN Little Ishikawa, MD 10 mL/hr at 05/19/20 0507 250  mL at 05/19/20 0507   albuterol (PROVENTIL) (2.5 MG/3ML) 0.083% nebulizer solution 3 mL  3 mL Inhalation Once PRN Little Ishikawa, MD       albuterol (VENTOLIN HFA) 108 (90 Base) MCG/ACT inhaler 2 puff  2 puff Inhalation Q6H PRN Agbata, Tochukwu, MD       aspirin EC tablet 81 mg  81 mg Oral Daily Agbata, Tochukwu, MD   81 mg at 05/22/20 1048   atorvastatin (LIPITOR) tablet 10 mg  10 mg Oral Daily Agbata, Tochukwu, MD   10 mg at 05/22/20 1048   ceFAZolin (ANCEF) IVPB 2g/100 mL premix  2 g Intravenous Q8H Hall, Scott A, RPH 200 mL/hr at 05/22/20 1256 2 g at 05/22/20 1256   diphenhydrAMINE (BENADRYL) injection 50 mg  50 mg Intravenous Once PRN Little Ishikawa, MD       enoxaparin (LOVENOX) injection 40 mg  40 mg Subcutaneous Q24H Agbata, Tochukwu, MD   40 mg at 05/21/20 1607   EPINEPHrine (EPI-PEN) injection 0.3 mg  0.3 mg Intramuscular Once PRN Little Ishikawa, MD       famotidine (PEPCID) IVPB 20 mg premix  20 mg Intravenous Once PRN Little Ishikawa, MD       insulin aspart (novoLOG) injection 0-15 Units  0-15 Units Subcutaneous TID WC Agbata, Tochukwu, MD   2 Units at 05/21/20 1802   insulin detemir (LEVEMIR) injection 20 Units  20 Units Subcutaneous QHS Samuella Cota, MD   20 Units at 05/21/20 2145   mometasone-formoterol (DULERA) 100-5 MCG/ACT inhaler 2 puff  2 puff Inhalation BID Samuella Cota, MD   2 puff at 05/22/20 1049   And   umeclidinium bromide (INCRUSE ELLIPTA) 62.5 MCG/INH 1 puff  1 puff Inhalation Daily Samuella Cota, MD   1 puff at 05/22/20 1049   nicotine (NICODERM CQ - dosed in mg/24 hours) patch 21 mg  21 mg Transdermal Daily Agbata, Tochukwu, MD   21 mg at 05/22/20 1049   oxyCODONE (Oxy IR/ROXICODONE) immediate release tablet 5 mg  5 mg Oral Q6H PRN Lang Snow, NP   5 mg at 05/22/20 0533   polyethylene glycol (MIRALAX / GLYCOLAX) packet 17 g  17 g Oral BID Samuella Cota, MD   17 g at 05/22/20 1049    senna-docusate (Senokot-S) tablet 1 tablet  1 tablet Oral QHS Samuella Cota, MD   1 tablet at 05/21/20 2146   Past Medical History:  Diagnosis Date   Allergic rhinitis    Aortic atherosclerosis ([place]) 05/19/2020   Asthma    COPD (chronic obstructive pulmonary disease) ([place])    Dermatitis seborrheica    Diabetes mellitus type II    ED (erectile dysfunction)    Emphysema lung ([place]) 05/19/2020   Frozen shoulder    HLD (hyperlipidemia)    HTN (hypertension)    Hyperkalemia    Kidney stone    Other diseases of lung, not elsewhere classified    Pulmonary nodule, right    lower lobe   Rotator cuff injury    right   Shoulder pain    Stroke [facility])    pt states "last year"   Tobacco abuse    Past Surgical History:  Procedure Laterality Date   CERVICAL DISCECTOMY  1998   EEG     ETT  11/1994   TEE WITHOUT CARDIOVERSION  12/03/2011   Procedure: TRANSESOPHAGEAL ECHOCARDIOGRAM (TEE);  Surgeon: Lelon Perla, MD;  Location: [facility] ENDOSCOPY;  Service: Cardiovascular;  Laterality: N/A;   TM repair     Social History Social History   Tobacco Use   Smoking status: Current Every Day Smoker    Packs/day: 1.00    Years: 65.00    Pack years: 65.00    Types: Cigarettes   Smokeless tobacco: Never Used  Scientific laboratory technician Use: Never used  Substance Use Topics   Alcohol use: No    Alcohol/week: 0.0 standard drinks    Comment: Quit 40 years ago   Drug use: No   Family History Family History  Problem Relation Age of Onset   Asthma Sister    Emphysema Sister    Diabetes Mother    Diabetes Brother    Heart disease Brother   Unable to collect family history due to the patient's mental status.  Allergies  Allergen Reactions   Amoxicillin-Pot Clavulanate Other (See Comments)    Severe stomach pain.    Quinapril Hcl Other (See Comments)    back pain   Valsartan Other (See Comments)    back pain   Varenicline Tartrate Other (See Comments)     AMS, hallucinations, night mares   REVIEW OF SYSTEMS (Negative unless checked)  Constitutional: [] Weight loss  [] Fever  [] Chills Cardiac: [] Chest pain   [] Chest pressure   [] Palpitations   [x] Shortness of breath when laying flat   [x] Shortness of breath at rest   [x] Shortness of breath with exertion. Vascular:  [] Pain in legs with walking   [] Pain in legs at rest   [] Pain in legs when laying flat   []   Claudication   [] Pain in feet when walking  [] Pain in feet at rest  [] Pain in feet when laying flat   [] History of DVT   [] Phlebitis   [] Swelling in legs   [] Varicose veins   [] Non-healing ulcers Pulmonary:   [] Uses home oxygen   [] Productive cough   [] Hemoptysis   [] Wheeze  [x] COPD   [] Asthma Neurologic:  [] Dizziness  [] Blackouts   [] Seizures   [] History of stroke   [] History of TIA  [] Aphasia   [] Temporary blindness   [] Dysphagia   [] Weakness or numbness in arms   [] Weakness or numbness in legs Musculoskeletal:  [] Arthritis   [] Joint swelling   [] Joint pain   [] Low back pain Hematologic:  [] Easy bruising  [] Easy bleeding   [] Hypercoagulable state   [] Anemic  [] Hepatitis Gastrointestinal:  [] Blood in stool   [] Vomiting blood  [] Gastroesophageal reflux/heartburn   [] Difficulty swallowing. Genitourinary:  [] Chronic kidney disease   [] Difficult urination  [] Frequent urination  [] Burning with urination   [] Blood in urine Skin:  [] Rashes   [] Ulcers   [] Wounds Psychological:  [] History of anxiety   []  History of major depression.  Positive for left fourth finger erythema and discomfort.  Physical Examination  Vitals:   05/22/20 0001 05/22/20 0535 05/22/20 0817 05/22/20 1237  BP: (!) 120/56 (!) 111/59 (!) 163/84 (!) 124/46  Pulse:   (!) 101 100  Resp:  19 18 18   Temp: 97.8 F (36.6 C) 97.8 F (36.6 C) 98 F (36.7 C) 97.6 F (36.4 C)  TempSrc: Oral Oral Oral Oral  SpO2: 91% 94% 94% 92%  Weight:      Height:       Body mass index is 26.12 kg/m. Gen:  WD/WN, NAD Head: [place]/AT, No temporalis  wasting. Prominent temp pulse not noted. Ear/Nose/Throat: Hearing grossly intact, nares w/o erythema or drainage, oropharynx w/o Erythema/Exudate Eyes: Sclera non-icteric, conjunctiva clear Neck: Trachea midline.  No JVD.  Pulmonary:  Good air movement, respirations not labored, equal bilaterally.  Cardiac: RRR, normal S1, S2. Vascular:  Vessel Right Left  Radial Palpable Palpable  Ulnar Palpable Palpable  Brachial Palpable Palpable                           Left upper extremity:   2+ radial pulse palpated. Fingers are warm.  Very tender to palpation fourth finger.  Erythematous.  Motor/sensory is intact.  Skin is intact.  No wounds.   Gastrointestinal: soft, non-tender/non-distended. No guarding/reflex.  Musculoskeletal: M/S 5/5 throughout.  Extremities without ischemic changes.  No deformity or atrophy. No edema. Neurologic: Sensation grossly intact in extremities.  Symmetrical.  Speech is fluent. Motor exam as listed above. Psychiatric: Judgment intact, Mood & affect appropriate for pt's clinical situation. Dermatologic: No rashes or ulcers noted.  No cellulitis or open wounds. Lymph : No Cervical, Axillary, or Inguinal lymphadenopathy.  CBC Lab Results  Component Value Date   WBC 21.5 (H) 05/22/2020   HGB 16.2 05/22/2020   HCT 47.6 05/22/2020   MCV 85.9 05/22/2020   PLT 275 05/22/2020   BMET    Component Value Date/Time   NA 136 05/19/2020 0510   K 3.1 (L) 05/19/2020 0510   CL 92 (L) 05/19/2020 0510   CO2 31 05/19/2020 0510   GLUCOSE 204 (H) 05/19/2020 0510   BUN 23 05/19/2020 0510   CREATININE 0.91 05/19/2020 0510   CREATININE 1.11 03/30/2020 1528   CALCIUM 8.6 (L) 05/19/2020 0510   GFRNONAA >60 05/19/2020 0510  GFRNONAA 65 05/17/2019 1557   GFRAA >60 05/19/2020 0510   GFRAA 75 05/17/2019 1557   Estimated Creatinine Clearance: 57.3 mL/min (by C-G formula based on SCr of 0.91 mg/dL).  COAG Lab Results  Component Value Date   INR 1.04 11/28/2013   INR  0.9 09/08/2007   Radiology DG Chest 1 View  Result Date: 05/20/2020 CLINICAL DATA:  Airway aspiration EXAM: CHEST  1 VIEW COMPARISON:  Two days ago FINDINGS: Interstitial coarsening that is unchanged. Emphysema and hyperinflation. Nodular density at the right base which correlates with a rib fracture by CT. Additional pulmonary nodule in the right lower lobe on recent chest CT. Normal heart size when allowing for rotation. IMPRESSION: COPD without acute superimposed finding, reference chest CT from 2 days ago. Electronically Signed   By: Monte Fantasia M.D.   On: 05/20/2020 07:38   CT ANGIO CHEST PE W OR WO CONTRAST  Result Date: 05/18/2020 CLINICAL DATA:  Increasing shortness of breath and weakness, COVID-19 diagnosis 5 days ago EXAM: CT ANGIOGRAPHY CHEST WITH CONTRAST TECHNIQUE: Multidetector CT imaging of the chest was performed using the standard protocol during bolus administration of intravenous contrast. Multiplanar CT image reconstructions and MIPs were obtained to evaluate the vascular anatomy. CONTRAST:  91mL OMNIPAQUE IOHEXOL 350 MG/ML SOLN COMPARISON:  05/18/2020, 12/10/2017 FINDINGS: Cardiovascular: This is a technically adequate evaluation of the pulmonary vasculature. No filling defects or pulmonary emboli. The heart is not enlarged. No pericardial effusion. Extensive atherosclerosis throughout the coronary vasculature unchanged. Normal caliber of the thoracic aorta, with minimal atherosclerosis of the aortic arch. Mediastinum/Nodes: No enlarged mediastinal, hilar, or axillary lymph nodes. Thyroid gland, trachea, and esophagus demonstrate no significant findings. Lungs/Pleura: There is severe background emphysema, upper lobe predominant. A spiculated nodule within the superior segment of the right lower lobe measures 1.4 by 1.0 by 1.3 cm, highly concerning for pulmonary malignancy. PET-CT is recommended for further evaluation. There is a 0.6 cm nodule within the lateral basilar segment of  the right lower lobe reference image 74/6, stable since prior exam and benign. No other areas of consolidation, effusion, or pneumothorax. Prominent bronchial wall thickening within the left lower lobe consistent with bronchitis. Otherwise the central airways are patent. Upper Abdomen: Nonspecific nodular thickening of the bilateral adrenal glands, measuring up to 11 mm on the right and 11 mm on the left, slightly more pronounced than prior study. Statistically this stool likely reflects hyperplasia or adenoma, though could be evaluated at the time of follow-up PET CT. High attenuation material within the gallbladder consistent with noncalcified stones or sludge. No evidence of cholecystitis. Musculoskeletal: There is a minimally displaced right anterolateral seventh rib fracture. There is a small amount of subcutaneous gas within the adjacent soft tissues, as well as a small hematoma surrounding the fracture line and extending into the pleural space, measuring up to 2 cm in thickness. No evidence of hemothorax or pneumothorax. No other acute bony abnormalities. Reconstructed images demonstrate no additional findings. Review of the MIP images confirms the above findings. IMPRESSION: 1. 1.4 cm spiculated right lower lobe mass, highly concerning for bronchogenic malignancy. PET-CT is recommended for further evaluation. 2. No evidence of pulmonary embolus. 3. Minimally displaced right anterolateral seventh rib fracture, with small surrounding hematoma. No evidence of hemothorax or pneumothorax. 4. Gallbladder sludge, no evidence of cholecystitis. 5. Nonspecific nodular thickening of the adrenal glands. This could be evaluated at the time of follow-up PET CT. 6. Aortic Atherosclerosis (ICD10-I70.0) and Emphysema (ICD10-J43.9). Electronically Signed   By: Legrand Como  Owens Shark M.D.   On: 05/18/2020 16:40   DG Chest Port 1 View  Result Date: 05/18/2020 CLINICAL DATA:  Shortness of breath EXAM: PORTABLE CHEST 1 VIEW  COMPARISON:  May 11, 2020 FINDINGS: There is mild interstitial thickening bilaterally. No edema or airspace opacity. Heart size and pulmonary vascularity are normal. No adenopathy. There is aortic atherosclerosis. No bone lesions IMPRESSION: Mild interstitial thickening bilaterally, probably indicative of a degree of chronic bronchitis. No edema or airspace opacity. Heart size within normal limits. Aortic Atherosclerosis (ICD10-I70.0). Electronically Signed   By: Lowella Grip III M.D.   On: 05/18/2020 12:34   DG Chest Portable 1 View  Result Date: 05/11/2020 CLINICAL DATA:  79 year old male positive COVID-19.  Cough. EXAM: PORTABLE CHEST 1 VIEW COMPARISON:  Portable chest 05/10/2020 and earlier. FINDINGS: Portable AP semi upright view at 0312 hours. Stable lung volumes and mediastinal contours. Asymmetric attenuation of left upper lobe bronchovascular markings in keeping with emphysema. No superimposed pneumothorax, pulmonary edema, pleural effusion, or confluent pulmonary opacity. No acute osseous abnormality identified. IMPRESSION: Emphysema (ICD10-J43.9).  No acute cardiopulmonary abnormality. Electronically Signed   By: Genevie Ann M.D.   On: 05/11/2020 03:32   Portable chest 1 View  Result Date: 05/10/2020 CLINICAL DATA:  Productive cough for several days. EXAM: PORTABLE CHEST 1 VIEW COMPARISON:  12/09/2017 FINDINGS: 0956 hours. Low volumes. Cardiopericardial silhouette is at upper limits of normal for size. The lungs are clear without focal pneumonia, edema, pneumothorax or pleural effusion. The visualized bony structures of the thorax show now acute abnormality. IMPRESSION: Low volume film without acute cardiopulmonary findings. Electronically Signed   By: Misty Stanley M.D.   On: 05/10/2020 10:18   DG Shoulder Left Port  Result Date: 05/19/2020 CLINICAL DATA:  Left shoulder pain, unable to provide history. EXAM: LEFT SHOULDER COMPARISON:  Chest CT 12/10/2017 FINDINGS: The osseous structures  appear diffusely demineralized which may limit detection of small or nondisplaced fractures. Corticated fragment along the anteroinferior glenoid could reflect sequela of prior glenoid labral injury or intra-articular bodies. Background of mild glenohumeral arthrosis. High-riding appearance of the humeral head moderate acromioclavicular arthrosis is present as well. Acromioclavicular alignment is maintained albeit with some focal soft tissue thickening superficial to the acromioclavicular joint. Recommend correlation for point tenderness to assess for a [place] type 1 acromioclavicular injury. Remaining soft tissues are unremarkable. IMPRESSION: 1. Focal soft tissue thickening superficial to the acromioclavicular joint. Recommend correlation for point tenderness for the exclusion of a [place] type 1 acromioclavicular injury. 2. Corticated fragment along the anteroinferior glenoid could reflect sequela of prior glenoid labral injury or intra-articular bodies. 3. Mild to moderate glenohumeral and acromioclavicular arthrosis. 4. High-riding humeral head could reflect chronic underlying rotator cuff tendinopathy given presence on comparison. Electronically Signed   By: Lovena Le M.D.   On: 05/19/2020 19:48   DG Finger Ring Left  Result Date: 05/20/2020 CLINICAL DATA:  Left ring finger pain, swelling, and redness. EXAM: LEFT RING FINGER 2+V COMPARISON:  None. FINDINGS: No soft tissue gas identified. A soft tissue calcification adjacent to the proximal aspect of the distal third phalanx is likely from trauma. The soft tissue calcifications well corticated and I suspect this is nonacute. No other bony abnormalities. IMPRESSION: A soft tissue calcification adjacent to the proximal aspect of the distal third phalanx is likely posttraumatic. While the finding is age indeterminate, I suspected is nonacute. No other abnormalities. Electronically Signed   By: Dorise Bullion III M.D   On: 05/20/2020 15:41  ECHOCARDIOGRAM COMPLETE  Result Date: 05/19/2020    ECHOCARDIOGRAM REPORT   Patient Name:   Jonathan [name] Date of Exam: 05/19/2020 Medical Rec #:  [id number]       Height:       65.0 in Accession #:    [id number]      Weight:       157.0 lb Date of Birth:  Nov 03, 1940       BSA:          1.785 m Patient Age:    60 years        BP:           190/75 mmHg Patient Gender: M               HR:           56 bpm. Exam Location:  [facility] Procedure: 2D Echo, Cardiac Doppler and Color Doppler Indications:     CHF- acute diastolic 401.02  History:         Patient has no prior history of Echocardiogram examinations.                  COPD; Risk Factors:Hypertension and Dyslipidemia. Tobacco                  abuse.  Sonographer:     Sherrie Sport RDCS (AE) Referring Phys:  [place] Diagnosing Phys: Kathlyn Sacramento MD  Sonographer Comments: Technically difficult study due to poor echo windows, no apical window and no parasternal window. IMPRESSIONS  1. Left ventricular ejection fraction, by estimation, is 55 to 60%. The left ventricle has normal function. Left ventricular endocardial border not optimally defined to evaluate regional wall motion. There is mild left ventricular hypertrophy. Left ventricular diastolic function could not be evaluated.  2. Right ventricular systolic function is normal. The right ventricular size is normal. Tricuspid regurgitation signal is inadequate for assessing PA pressure.  3. Left atrial size was mildly dilated.  4. The mitral valve is normal in structure. No evidence of mitral valve regurgitation. No evidence of mitral stenosis.  5. The aortic valve is normal in structure. Aortic valve regurgitation is not visualized. No aortic stenosis is present.  6. challenging image quality with limited views. FINDINGS  Left Ventricle: Left ventricular ejection fraction, by estimation, is 55 to 60%. The left ventricle has normal function. Left ventricular endocardial border not optimally defined to  evaluate regional wall motion. The left ventricular internal cavity size was normal in size. There is mild left ventricular hypertrophy. Left ventricular diastolic function could not be evaluated. Right Ventricle: The right ventricular size is normal. No increase in right ventricular wall thickness. Right ventricular systolic function is normal. Tricuspid regurgitation signal is inadequate for assessing PA pressure. Left Atrium: Left atrial size was mildly dilated. Right Atrium: Right atrial size was normal in size. Pericardium: There is no evidence of pericardial effusion. Mitral Valve: The mitral valve is normal in structure. Normal mobility of the mitral valve leaflets. No evidence of mitral valve regurgitation. No evidence of mitral valve stenosis. Tricuspid Valve: The tricuspid valve is normal in structure. Tricuspid valve regurgitation is not demonstrated. No evidence of tricuspid stenosis. Aortic Valve: The aortic valve is normal in structure. Aortic valve regurgitation is not visualized. No aortic stenosis is present. Pulmonic Valve: The pulmonic valve was normal in structure. Pulmonic valve regurgitation is not visualized. No evidence of pulmonic stenosis. Aorta: The aortic root is normal in size and structure. Venous: The  inferior vena cava was not well visualized. IAS/Shunts: No atrial level shunt detected by color flow Doppler.  LEFT VENTRICLE PLAX 2D LVIDd:         3.85 cm LVIDs:         2.76 cm LV PW:         1.23 cm LV IVS:        1.23 cm  LEFT ATRIUM         Index LA diam:    4.30 cm 2.41 cm/m                        PULMONIC VALVE AORTA                 PV Vmax:        0.69 m/s Ao Root diam: 2.50 cm PV Peak grad:   1.9 mmHg                       RVOT Peak grad: 6 mmHg  Kathlyn Sacramento MD Electronically signed by Kathlyn Sacramento MD Signature Date/Time: 05/19/2020/11:22:42 AM    Final    Assessment/Plan The patient is a 79 year old male with multiple medical issues including recent diagnosis with  Covid, weakness and deconditioning found to have left fourth finger erythema and discomfort  1.  Possible left fourth finger ischemia: Physical exam notable for significant tenderness and erythema to the left fourth finger.  Able to palpate a radial pulse however there is bluish discoloration to the base of the finger as well as erythema distally.  Very tender to palpation.  Motor function is preserved at this time.  Possible cardiac atheroemboli?  Recommend undergoing a left upper extremity angiogram with possible intervention and attempt assess the patient's anatomy and assess for atherosclerotic disease versus emboli / Covid? I did my best to explain the situation to the patient in regard to the risk benefits and the procedure itself.  Patient states he would like to proceed.  Plan on angiogram tomorrow with Dr. Lucky Cowboy.  2. Active COVID 19: Airborne precautions Given monoclonal antibodies initially  Treated with Remdesivir Followed by infectious disease  3. Tobacco Abuse: We had a discussion for approximately three minutes regarding the absolute need for smoking cessation due to the deleterious nature of tobacco on the vascular system. We discussed the tobacco use would diminish patency of any intervention, and likely significantly worsen progressio of disease. We discussed multiple agents for quitting including replacement therapy or medications to reduce cravings such as Chantix. The patient voices their understanding of the importance of smoking cessation.  Discussed with Dr. Mayme Genta, PA-C  05/22/2020 2:42 PM  This note was created with Dragon medical transcription system.  Any error is purely unintentional

## 2020-05-23 ENCOUNTER — Ambulatory Visit: Payer: Medicare Other

## 2020-05-23 ENCOUNTER — Encounter
Admission: EM | Disposition: A | Discharge status: Hospice | Payer: Self-pay | Source: Home / Self Care | Attending: Internal Medicine

## 2020-05-23 DIAGNOSIS — I998 Other disorder of circulatory system: Secondary | ICD-10-CM

## 2020-05-23 HISTORY — PX: UPPER EXTREMITY ANGIOGRAPHY: CATH118270

## 2020-05-23 LAB — BASIC METABOLIC PANEL
Anion gap: 11 (ref 5–15)
BUN: 21 mg/dL (ref 8–23)
CO2: 36 mmol/L — ABNORMAL HIGH (ref 22–32)
Calcium: 7.9 mg/dL — ABNORMAL LOW (ref 8.9–10.3)
Chloride: 94 mmol/L — ABNORMAL LOW (ref 98–111)
Creatinine, Ser: 1 mg/dL (ref 0.61–1.24)
GFR calc Af Amer: >60 mL/min (ref >60)
GFR calc non Af Amer: >60 mL/min (ref >60)
Glucose, Bld: 71 mg/dL (ref 70–99)
Potassium: 2.6 mmol/L — CL (ref 3.5–5.1)
Sodium: 141 mmol/L (ref 135–145)

## 2020-05-23 LAB — GLUCOSE, CAPILLARY
Glucose-Capillary: 71 mg/dL (ref 70–99)
Glucose-Capillary: 71 mg/dL (ref 70–99)
Glucose-Capillary: 69 mg/dL — ABNORMAL LOW (ref 70–99)
Glucose-Capillary: 119 mg/dL — ABNORMAL HIGH (ref 70–99)
Glucose-Capillary: 126 mg/dL — ABNORMAL HIGH (ref 70–99)
Glucose-Capillary: 134 mg/dL — ABNORMAL HIGH (ref 70–99)

## 2020-05-23 SURGERY — ECHOCARDIOGRAM, TRANSESOPHAGEAL
Anesthesia: Choice

## 2020-05-23 SURGERY — UPPER EXTREMITY ANGIOGRAPHY
Anesthesia: Choice | Laterality: Left

## 2020-05-23 MED ORDER — FENTANYL CITRATE (PF) 100 MCG/2ML IJ SOLN
INTRAMUSCULAR | Status: DC | PRN
  Administered 2020-05-23: 25 ug
  Administered 2020-05-23: 50 ug via INTRAVENOUS

## 2020-05-23 MED ORDER — CLOPIDOGREL BISULFATE 75 MG PO TABS
75.0000 mg | ORAL_TABLET | Freq: Every day | ORAL | Status: DC
  Administered 2020-05-24 – 2020-05-29 (×5): 75 mg via ORAL
  Filled 2020-05-23 (×5): qty 1

## 2020-05-23 MED ORDER — MIDAZOLAM HCL 2 MG/2ML IJ SOLN
INTRAMUSCULAR | Status: DC | PRN
  Administered 2020-05-23: 2 mg via INTRAVENOUS
  Administered 2020-05-23: 1 mg

## 2020-05-23 MED ORDER — FAMOTIDINE 20 MG PO TABS: 40.0000 mg | ORAL_TABLET | Freq: Once | ORAL | Status: DC | PRN

## 2020-05-23 MED ORDER — POTASSIUM CHLORIDE 10 MEQ/100ML IV SOLN
10.0000 meq | INTRAVENOUS | Status: AC
  Administered 2020-05-23 (×3): 10 meq via INTRAVENOUS
  Filled 2020-05-23 (×4): qty 100

## 2020-05-23 MED ORDER — CLINDAMYCIN PHOSPHATE 300 MG/50ML IV SOLN
300.0000 mg | Freq: Once | INTRAVENOUS | Status: DC
  Filled 2020-05-23: qty 50

## 2020-05-23 MED ORDER — MIDAZOLAM HCL 2 MG/ML PO SYRP: 8.0000 mg | ORAL_SOLUTION | Freq: Once | ORAL | Status: DC | PRN

## 2020-05-23 MED ORDER — DEXTROSE 50 % IV SOLN: 25.0000 mL | Freq: Once | INTRAVENOUS | Status: AC

## 2020-05-23 MED ORDER — ONDANSETRON HCL 4 MG/2ML IJ SOLN: 4.0000 mg | Freq: Four times a day (QID) | INTRAMUSCULAR | Status: DC | PRN

## 2020-05-23 MED ORDER — SODIUM CHLORIDE 0.9 % IV SOLN: INTRAVENOUS | Status: DC

## 2020-05-23 MED ORDER — DEXTROSE 50 % IV SOLN
INTRAVENOUS | Status: AC
  Administered 2020-05-23: 25 mL via INTRAVENOUS
  Filled 2020-05-23: qty 50

## 2020-05-23 MED ORDER — CLINDAMYCIN PHOSPHATE 300 MG/50ML IV SOLN
300.0000 mg | INTRAVENOUS | Status: DC
  Filled 2020-05-23: qty 50

## 2020-05-23 MED ORDER — HEPARIN SODIUM (PORCINE) 1000 UNIT/ML IJ SOLN
INTRAMUSCULAR | Status: DC | PRN
  Administered 2020-05-23: 4000 [IU] via INTRAVENOUS

## 2020-05-23 MED ORDER — HYDROMORPHONE HCL 1 MG/ML IJ SOLN: 1.0000 mg | Freq: Once | INTRAMUSCULAR | Status: DC | PRN

## 2020-05-23 MED ORDER — MIDAZOLAM HCL 5 MG/5ML IJ SOLN
INTRAMUSCULAR | Status: AC
  Filled 2020-05-23: qty 5

## 2020-05-23 MED ORDER — FENTANYL CITRATE (PF) 100 MCG/2ML IJ SOLN
INTRAMUSCULAR | Status: AC
Start: 2020-05-23 — End: 2020-05-24
  Filled 2020-05-23: qty 2

## 2020-05-23 MED ORDER — POTASSIUM CHLORIDE CRYS ER 20 MEQ PO TBCR
40.0000 meq | EXTENDED_RELEASE_TABLET | Freq: Once | ORAL | Status: DC
  Filled 2020-05-23: qty 2

## 2020-05-23 MED ORDER — DEXTROSE 50 % IV SOLN
INTRAVENOUS | Status: AC
  Filled 2020-05-23: qty 50

## 2020-05-23 MED ORDER — METHYLPREDNISOLONE SODIUM SUCC 125 MG IJ SOLR: 125.0000 mg | Freq: Once | INTRAMUSCULAR | Status: DC | PRN

## 2020-05-23 MED ORDER — HEPARIN SODIUM (PORCINE) 1000 UNIT/ML IJ SOLN
INTRAMUSCULAR | Status: AC
  Filled 2020-05-23: qty 1

## 2020-05-23 SURGICAL SUPPLY — 16 items
BALLN ULTRVRSE 8X40X75C (BALLOONS) ×3
BALLOON ULTRVRSE 8X40X75C (BALLOONS) IMPLANT
CANNULA 5F STIFF (CANNULA) ×2 IMPLANT
CATH ANGIO 5F PIGTAIL 100CM (CATHETERS) ×2 IMPLANT
CATH BEACON 5 .035 100 H1 TIP (CATHETERS) ×2 IMPLANT
DEVICE INFLAT 30 PLUS (MISCELLANEOUS) ×2 IMPLANT
DEVICE STARCLOSE SE CLOSURE (Vascular Products) ×2 IMPLANT
GLIDEWIRE ANGLED SS 035X260CM (WIRE) ×2 IMPLANT
PACK ANGIOGRAPHY (CUSTOM PROCEDURE TRAY) ×2 IMPLANT
SHEATH BRITE TIP 5FRX11 (SHEATH) ×2 IMPLANT
SHEATH RAABE 6FRX70 (SHEATH) ×2 IMPLANT
STENT LIFESTREAM 7X26X135 (Permanent Stent) ×2 IMPLANT
SYR MEDRAD MARK 7 150ML (SYRINGE) ×2 IMPLANT
TUBING CONTRAST HIGH PRESS 72 (TUBING) ×2 IMPLANT
WIRE J 3MM .035X145CM (WIRE) ×2 IMPLANT
WIRE MAGIC TORQUE 315CM (WIRE) ×2 IMPLANT

## 2020-05-23 NOTE — Interval H&P Note (Signed)
History and Physical Interval Note:  05/23/2020 3:52 PM  Jonathan [name]  has presented today for surgery, with the diagnosis of Left fourth finger ischemia.  The various methods of treatment have been discussed with the patient and family. After consideration of risks, benefits and other options for treatment, the patient has consented to  Procedure(s): Upper Extremity Angiography (Left) as a surgical intervention.  The patient's history has been reviewed, patient examined, no change in status, stable for surgery.  I have reviewed the patient's chart and labs.  Questions were answered to the patient's satisfaction.     [name]

## 2020-05-23 NOTE — Progress Notes (Signed)
Provided report to Denice Paradise, RN and femoral site was assessed (level 0) once transferred back to patient's room.

## 2020-05-23 NOTE — Progress Notes (Signed)
Nutrition Follow-up  DOCUMENTATION CODES:   Not applicable  INTERVENTION:  Provide oral nutrition supplement as appropriate with diet advancement   NUTRITION DIAGNOSIS:   Increased nutrient needs related to catabolic illness (AECOPD, COVID-19) as evidenced by estimated needs. -ongoing  GOAL:   Patient will meet greater than or equal to 90% of their needs -progresssing  MONITOR:   PO intake, Diet advancement, Labs, Weight trends, I & O's  REASON FOR ASSESSMENT:   Consult Assessment of nutrition requirement/status  ASSESSMENT:  RD working remotely.  79 year old male with PMHx of HTN, HLD, COPD, DM, asthma who was recently discharged after treatment for COVID-19 now admitted with bacteremia due to staphylococcus aureus, acute exacerbation of COPD, also with 1.4cm spiculated right lower lobe mass concerning for malignancy.  8/20 - admit; CM diet 8/22- NPO/D3 8/24 - NPO  Unable to reach pt via phone today. Per notes, he was scheduled for TEE today, however specials refused to take him down due to airborne isolation. MD spoke with infection prevention on floor, okay to d/c airborne isolation, TEE rescheduled for tomorrow, Plans for angiography of left ring finger today, remains NPO. Will monitor for diet advancement and provide nutrition supplement as appropriate to aid with meeting needs.   Medications reviewed and include: SSI, Levemir 10 units at bedtime, Miralax, Klor-con, Senokot NaCl @ 75 ml/hr Clindamycin  Labs: CBGs 71,71,118,92, K 2.6 (L)  Diet Order:   Diet Order            Diet NPO time specified  Diet effective midnight                 EDUCATION NEEDS:   No education needs have been identified at this time  Skin:  Skin Assessment: Reviewed RN Assessment  Last BM:  8/23  Height:   Ht Readings from Last 1 Encounters:  05/18/20 5\' 5"  (1.651 m)    Weight:   Wt Readings from Last 1 Encounters:  05/18/20 71.2 kg    BMI:  Body mass index is  26.12 kg/m.  Estimated Nutritional Needs:   Kcal:  1900-2100  Protein:  95-105 grams  Fluid:  1.8 L/day   Lajuan Lines, RD, LDN Clinical Nutrition After Hours/Weekend Pager # in [place]

## 2020-05-23 NOTE — Progress Notes (Addendum)
Interval history unremarkable. Patient still having significant pain in L ring finger.   Patient reexamined today. Exam is mostly unchanged from yesterday (see consult note), except patient is less tender to palpation over L ring finger, with most prominent tenderness along radial aspect of DIP joint. Mild swelling and erythema essentially unchanged.   Plan: 1. No abscess or fluid collection amenable to drainage. Abx per primary team/ID team.  2. Possibility of more acute fracture exists as well.  3. Agree with workup for vascular etiology as well.   4. Will plan to follow peripherally. Please page with any questions.

## 2020-05-23 NOTE — Progress Notes (Addendum)
   Transesophageal echocardiogram Rescheduled for Thursday 05/24/20 at 7:30AM with Dr. Rockey Situ.  The risks and benefits of transesophageal echocardiogram have been explained including risks of esophageal damage, perforation (1:10,000 risk), bleeding, pharyngeal hematoma as well as other potential complications associated with conscious sedation including aspiration, arrhythmia, respiratory failure and death. Alternatives to treatment were discussed, questions were answered. Patient is willing to proceed.   Arvil Chaco, PA-C 05/23/2020 2:51 PM

## 2020-05-23 NOTE — Progress Notes (Signed)
  Id Pt not in his room Getting angio  Patient Vitals for the past 24 hrs:  BP Temp Temp src Pulse Resp SpO2  05/23/20 0831 (!) 120/42 98 F (36.7 C) Oral 83 20 94 %  05/23/20 0050 (!) 119/48 97.6 F (36.4 C) Oral 98 20 95 %  05/22/20 2050 (!) 125/45 -- -- -- -- --  05/22/20 2046 (!) 145/129 97.6 F (36.4 C) Oral 100 19 96 %  05/22/20 1753 (!) 150/70 97.7 F (36.5 C) Oral 86 18 92 %  05/22/20 1237 (!) 124/46 97.6 F (36.4 C) Oral 100 18 92 %     CBC Latest Ref Rng & Units 05/22/2020 05/21/2020 05/20/2020  WBC 4.0 - 10.5 K/uL 21.5(H) 28.0(H) 28.5(H)  Hemoglobin 13.0 - 17.0 g/dL 16.2 17.8(H) 16.5  Hematocrit 39 - 52 % 47.6 52.4(H) 47.1  Platelets 150 - 400 K/uL 275 289 305    Impression/Recommendation ?Patient with Covid diagnosed on 05/10/2020 and received Regeneron monoclonal antibody, remdesivir and now has staph aureus bacteremia  Staph aureus bacteremia unclear source. Has a tender painful/eryhtematous  left third finger.  Wonder whether it is the source or result of the bacteremia Repeat blood cultures have been negative so far 2D echo has been done but is a poor study and the limited study shows valves to be okay. He will need TEE. cefazolin changed  to nafcillin as leukocytes persist. Seen by ortho and vascular- getting angio  COPD on oxygen 1.4 cm spiculated nodule on the right lower lobe concerning for malignancy No pneumonia  Hypertension  Hyperlipidemia on atorvastatin Smoker.   Diabetes mellitus currently on sliding scale.

## 2020-05-23 NOTE — Op Note (Signed)
OPERATIVE REPORT     PREOPERATIVE DIAGNOSIS: 1.  Ischemic finger of the left hand   POSTOPERATIVE DIAGNOSIS: Same as above   PROCEDURE PERFORMED: 1. Ultrasound guidance vascular access to right femoral artery. 2. Catheter placement to left brachial artery from right femoral approach 3. Thoracic aortogram and selective left upper extremity angiogram 4.  Covered stent placement to left subclavian artery with 7 mm diameter by 26 mm length lifestream stent 5. StarClose closure device right femoral artery.   SURGEON:  Algernon Huxley, MD   ANESTHESIA:  Local with moderate conscious sedation for 25 minutes using 3 mg of Versed and 75 mcg of Fentanyl   BLOOD LOSS:  Minimal.   INDICATION FOR PROCEDURE:  This is a 79 y.o.male who presented to our office with an ischemic finger of the left hand to further evaluate this to determine what options would be possible to treat the genital ischemia, angiogram of the left upper extremity is indicated.  Risks and benefits are discussed.  Informed consent was obtained.   DESCRIPTION OF PROCEDURE:  The patient was brought to the vascular suite.  Moderate conscious sedation was administered during a face to face encounter with the patient throughout the procedure with my supervision of the RN administering medicines and monitoring the patient's vital signs, pulse oximetry, telemetry and mental status throughout from the start of the procedure until the patient was taken to the recovery room.  Groins were shaved and prepped and sterile surgical field was created.  The right femoral head was localized with fluoroscopy and the right femoral artery was then visualized with ultrasound and found to be widely patent.  It was then accessed under direct ultrasound guidance without difficulty with a Seldinger needle and a permanent image was recorded.  A J-wire and 5-French sheath were then placed.  Pigtail catheter was placed into the ascending aorta and a thoracic  aortogram was then performed in the LAO projection. This demonstrated normal origins to the great vessels without significant proximal stenoses and a normal configuration of the great vessels.  There did appear however to be a significant stenosis of the left subclavian artery just distal to the vertebral artery by few centimeters.  This is an ulcerated 75% lesion.  I then selectively cannulated the left subclavian artery with a headhunter catheter and selective imaging did demonstrate this ulcerated lesion to be present.  I then crossed the lesion without difficulty and advanced to the proximal left brachial artery where selective left upper extremity angiogram was performed showing mild disease in the axillary brachial artery without significant stenosis.  The radial and ulnar arteries were patent without significant disease although flow was sluggish..  I then placed a Magic torque wire and a 6 French sheath in the proximal left subclavian artery from the femoral approach.  The patient was heparinized.  I selected a 7 mm diameter by 26 mm length lifestream stent and deployed across the lesion taking care not to impinge flow of the vertebral artery.  This is postdilated with an 8 mm balloon with excellent angiographic ablation result and no significant residual stenosis. The diagnostic catheter was removed.  Oblique arteriogram was performed of the right femoral artery and StarClose closure device deployed in the usual fashion with excellent hemostatic result. The patient tolerated the procedure well and was taken to the recovery room in stable condition.    [name] 05/23/2020 5:46 PM

## 2020-05-23 NOTE — Progress Notes (Signed)
PROGRESS NOTE    Jonathan [name]  NLG:[id number] DOB: [birth year] DOA: 05/18/2020 PCP: Abner Greenspan, MD   Brief Narrative:  79 year old male who was recently discharged from the hospital after treatment for COVID-19 viral infection. Presented to ED for evaluation of worsening shortness of breath, nonproductive cough and wheezing and was found to be hypoxic with room air pulse oximetry at rest of 86%. Admitted for COPD exacerbation in the context of recent Covid infection.  COPD exacerbation resolved overnight.  Was found to have MSSA bacteremia which has become the dominant reason for ongoing hospitalization.  Initially refused TEE 8/23, but now agreeable to TEE 8/25.  Source of bacteremia unclear, could be phenomenon related to Covid.  Developed spontaneous left ring finger erythema distally of unclear significance.  Orthopedics following.  Seen by orthopedics, infectious disease and vascular surgery.  Plan for angiogram 8/25 to exclude vascular pathology.   Admit 8/13 to 8/16 for COVID tx w/ remdesivir, steroids. Discharged on steroid taper and compelte outpt remdesivir.   8/13 ED, hypoxic, admitted for COVID  8/12 ED, COVID+, received MAB and was d/c from ED  8/25: Patient seen and examined. Little bit irritated this morning about being n.p.o. Communicated with cardiology this morning. Patient was scheduled for TEE however specials refused to take patient down as he is currently on airborne isolation. Spoke with infection prevention on the floor. Patient's last test was 05/10/2020. He is asymptomatic from a Covid standpoint. No oxygen requirement, no evidence of pneumonia on CT, no fevers. Okay to DC airborne isolation  Communicated with vascular surgery. Plans for angiography of left ring finger today. Time unknown at this point.  Assessment & Plan:   Principal Problem:   Bacteremia due to Staphylococcus aureus Active Problems:   Tobacco abuse   Physical deconditioning   Type 2  diabetes mellitus with hyperglycemia, with long-term current use of insulin (HCC)   COVID-19 virus infection   COPD with acute exacerbation (HCC)   Closed rib fracture   Lung mass   Aortic atherosclerosis (HCC)   Emphysema lung (HCC)   Finger pain, left  Bacteremia due to Staphylococcus aureus --remains afebrile, vital signs stable. --sepsis ruled out --Source remains unclear.  Remote neck surgery.  No other hardware.  No drug use.  Skin appears unremarkable except for distal left ring finger, which has circumferential erythema. Tender but no abscess or lesions. Xray probably nonacute.  Plan: Continue Ancef Follow surveillance cultures, no growth to date ID following, recommendations appreciated Plan for TEE 05/24/2020 Airborne isolation discontinued  Finger pain, left --xray nonacute, etiology unclear; may be Covid phenomenon,  --Appreciate orthopedic evaluation.  Will continue to follow.  Etiology unclear. --Plan for angiography today with vascular surgery. Communicated with vascular service PA  Type 2 diabetes mellitus with hyperglycemia, with long-term current use of insulin (HCC) -Good control over interval. Basal bolus regimen  COPD with acute exacerbation ([place]) with acute hypoxic respiratory failure --resolved. On RA --Continue bronchodilators.  Continue supplemental oxygen.  COVID-19 virus infection --Status post treatment last hospitalization with remdesivir and steroids.  Appears asymptomatic at this time.  Also received monoclonal antibody infusion 8/12. --Asymptomatic. --Positive test 05/10/2020. Communicated with infection prevention. DC airborne isolation precautions  Closed rib fracture --Minimally displaced right anterolateral seventh rib fracture, with small surrounding hematoma. No evidence of hemothorax or pneumothorax.  Monitor clinically. --secondary to fall at home  Lung mass --1.4 cm spiculated right lower lobe mass, highly concerning for  bronchogenic malignancy. PET-CT is recommended for further  Evaluation. --f/u as outpt -- I discussed w/ wife and daughter by telephone 8/22  Aortic atherosclerosis ([place]) --continue statin  Emphysema lung ([place]) --as per COPD   DVT prophylaxis: Lovenox Code Status: Full Family Communication: Wife [phone] on 05/23/2020 Disposition Plan: Status is: Inpatient  Remains inpatient appropriate because:Inpatient level of care appropriate due to severity of illness   Dispo: The patient is from: Home              Anticipated d/c is to: Home              Anticipated d/c date is: 3 days              Patient currently is not medically stable to d/c.  Work-up in progress for staph bacteremia. Angiography with vascular surgery today. TEE scheduled for tomorrow.       Consultants:   Vascular surgery  Cardiology  Procedures:   2D echocardiogram  Antimicrobials:   Ancef   Subjective: Patient seen and examined. No pain complaints. Frustrated about n.p.o. status  Objective: Vitals:   05/22/20 2050 05/23/20 0050 05/23/20 0831 05/23/20 1249  BP: (!) 125/45 (!) 119/48 (!) 120/42 (!) 148/62  Pulse:  98 83 85  Resp:  20 20   Temp:  97.6 F (36.4 C) 98 F (36.7 C) 98 F (36.7 C)  TempSrc:  Oral Oral Oral  SpO2:  95% 94% 91%  Weight:      Height:       No intake or output data in the 24 hours ending 05/23/20 1420 Filed Weights   05/18/20 1213  Weight: 71.2 kg    Examination:  General exam: Appears calm and comfortable  Respiratory system: Clear to auscultation. Respiratory effort normal. Cardiovascular system: S1 & S2 heard, RRR. No JVD, murmurs, rubs, gallops or clicks. No pedal edema. Gastrointestinal system: Abdomen is nondistended, soft and nontender. No organomegaly or masses felt. Normal bowel sounds heard. Central nervous system: Alert and oriented. No focal neurological deficits. Extremities: Left ring finger distal erythema. Tender to touch. Decreased  range of motion. Skin: No rashes, lesions or ulcers Psychiatry: Judgement and insight appear normal. Mood & affect appropriate.     Data Reviewed: I have personally reviewed following labs and imaging studies  CBC: Recent Labs  Lab 05/18/20 1220 05/19/20 0510 05/20/20 0623 05/21/20 0537 05/22/20 0435  WBC 24.2* 29.1* 28.5* 28.0* 21.5*  NEUTROABS 20.8*  --   --   --   --   HGB 17.7* 16.5 16.5 17.8* 16.2  HCT 52.9* 47.9 47.1 52.4* 47.6  MCV 87.7 85.1 85.3 84.9 85.9  PLT 329 319 305 289 355   Basic Metabolic Panel: Recent Labs  Lab 05/18/20 1220 05/19/20 0510 05/20/20 0623 05/23/20 0655  NA 135 136  --  141  K 3.5 3.1*  --  2.6*  CL 90* 92*  --  94*  CO2 27 31  --  36*  GLUCOSE 285* 204*  --  71  BUN 24* 23  --  21  CREATININE 1.05 0.91  --  1.00  CALCIUM 8.7* 8.6*  --  7.9*  MG  --   --  2.1  --    GFR: Estimated Creatinine Clearance: 52.1 mL/min (by C-G formula based on SCr of 1 mg/dL). Liver Function Tests: Recent Labs  Lab 05/18/20 1220  AST 24  ALT 20  ALKPHOS 87  BILITOT 2.0*  PROT 7.2  ALBUMIN 2.8*   No results for input(s): LIPASE, AMYLASE in the  last 168 hours. No results for input(s): AMMONIA in the last 168 hours. Coagulation Profile: No results for input(s): INR, PROTIME in the last 168 hours. Cardiac Enzymes: No results for input(s): CKTOTAL, CKMB, CKMBINDEX, TROPONINI in the last 168 hours. BNP (last 3 results) No results for input(s): PROBNP in the last 8760 hours. HbA1C: No results for input(s): HGBA1C in the last 72 hours. CBG: Recent Labs  Lab 05/22/20 1234 05/22/20 1659 05/22/20 2028 05/23/20 0831 05/23/20 1246  GLUCAP 98 92 118* 71 71   Lipid Profile: No results for input(s): CHOL, HDL, LDLCALC, TRIG, CHOLHDL, LDLDIRECT in the last 72 hours. Thyroid Function Tests: No results for input(s): TSH, T4TOTAL, FREET4, T3FREE, THYROIDAB in the last 72 hours. Anemia Panel: No results for input(s): VITAMINB12, FOLATE, FERRITIN,  TIBC, IRON, RETICCTPCT in the last 72 hours. Sepsis Labs: Recent Labs  Lab 05/18/20 1220 05/18/20 1644  PROCALCITON 0.29  --   LATICACIDVEN 2.6* 2.2*    Recent Results (from the past 240 hour(s))  Blood Culture (routine x 2)     Status: Abnormal   Collection Time: 05/18/20 12:27 PM   Specimen: BLOOD  Result Value Ref Range Status   Specimen Description   Final    BLOOD BLOOD LEFT HAND Performed at [facility], [address] [place], [place] [address]    Special Requests   Final    BOTTLES DRAWN AEROBIC AND ANAEROBIC Blood Culture adequate volume Performed at [facility], [address] [place], [place] [address]    Culture  Setup Time   Final    Organism ID to follow [facility] AND ANAEROBIC BOTTLES CRITICAL RESULT CALLED TO, READ BACK BY AND VERIFIED WITH: SCOTT HALL 05/19/20 AT 0010 HS Performed at [facility], [address] [place], [place] [address]    Culture STAPHYLOCOCCUS AUREUS (A)  Final   Report Status 05/21/2020 FINAL  Final   Organism ID, Bacteria STAPHYLOCOCCUS AUREUS  Final      Susceptibility   Staphylococcus aureus - MIC*    CIPROFLOXACIN <=0.5 SENSITIVE Sensitive     ERYTHROMYCIN >=8 RESISTANT Resistant     GENTAMICIN <=0.5 SENSITIVE Sensitive     OXACILLIN 0.5 SENSITIVE Sensitive     TETRACYCLINE <=1 SENSITIVE Sensitive     VANCOMYCIN <=0.5 SENSITIVE Sensitive     TRIMETH/SULFA <=10 SENSITIVE Sensitive     CLINDAMYCIN <=0.25 SENSITIVE Sensitive     RIFAMPIN <=0.5 SENSITIVE Sensitive     Inducible Clindamycin NEGATIVE Sensitive     * STAPHYLOCOCCUS AUREUS  Blood Culture (routine x 2)     Status: Abnormal   Collection Time: 05/18/20 12:27 PM   Specimen: BLOOD  Result Value Ref Range Status   Specimen Description   Final    BLOOD BLOOD LEFT FOREARM Performed at [facility], [address], [place], [place] [address]    Special Requests   Final    BOTTLES DRAWN AEROBIC AND  ANAEROBIC Blood Culture adequate volume Performed at [facility], [address] [place], [place] [address]    Culture  Setup Time   Final    GRAM POSITIVE COCCI IN BOTH AEROBIC AND ANAEROBIC BOTTLES CRITICAL VALUE NOTED.  VALUE IS CONSISTENT WITH PREVIOUSLY REPORTED AND CALLED VALUE. Performed at [facility], [address] [place], [place] [address]    Culture (A)  Final    STAPHYLOCOCCUS AUREUS SUSCEPTIBILITIES PERFORMED ON PREVIOUS CULTURE WITHIN THE LAST 5 DAYS. Performed at [facility],  [address] [place], [place] [address]    Report Status 05/21/2020 FINAL  Final  Blood Culture ID Panel (Reflexed)     Status: Abnormal   Collection Time: 05/18/20 12:27 PM  Result Value Ref Range Status   Enterococcus faecalis NOT DETECTED NOT DETECTED Final   Enterococcus Faecium NOT DETECTED NOT DETECTED Final   Listeria monocytogenes NOT DETECTED NOT DETECTED Final   Staphylococcus species DETECTED (A) NOT DETECTED Final    Comment: CRITICAL RESULT CALLED TO, READ BACK BY AND VERIFIED WITH: SCOTT HALL 05/19/20 AT 0010 HS    Staphylococcus aureus (BCID) DETECTED (A) NOT DETECTED Final    Comment: CRITICAL RESULT CALLED TO, READ BACK BY AND VERIFIED WITH: SCOTT HALL 05/19/20 AT 0010 HS    Staphylococcus epidermidis NOT DETECTED NOT DETECTED Final   Staphylococcus lugdunensis NOT DETECTED NOT DETECTED Final   Streptococcus species NOT DETECTED NOT DETECTED Final   Streptococcus agalactiae NOT DETECTED NOT DETECTED Final   Streptococcus pneumoniae NOT DETECTED NOT DETECTED Final   Streptococcus pyogenes NOT DETECTED NOT DETECTED Final   A.calcoaceticus-baumannii NOT DETECTED NOT DETECTED Final   Bacteroides fragilis NOT DETECTED NOT DETECTED Final   Enterobacterales NOT DETECTED NOT DETECTED Final   Enterobacter cloacae complex NOT DETECTED NOT DETECTED Final   Escherichia coli NOT DETECTED NOT DETECTED Final   Klebsiella aerogenes NOT DETECTED NOT  DETECTED Final   Klebsiella oxytoca NOT DETECTED NOT DETECTED Final   Klebsiella pneumoniae NOT DETECTED NOT DETECTED Final   Proteus species NOT DETECTED NOT DETECTED Final   Salmonella species NOT DETECTED NOT DETECTED Final   Serratia marcescens NOT DETECTED NOT DETECTED Final   Haemophilus influenzae NOT DETECTED NOT DETECTED Final   Neisseria meningitidis NOT DETECTED NOT DETECTED Final   Pseudomonas aeruginosa NOT DETECTED NOT DETECTED Final   Stenotrophomonas maltophilia NOT DETECTED NOT DETECTED Final   Candida albicans NOT DETECTED NOT DETECTED Final   Candida auris NOT DETECTED NOT DETECTED Final   Candida glabrata NOT DETECTED NOT DETECTED Final   Candida krusei NOT DETECTED NOT DETECTED Final   Candida parapsilosis NOT DETECTED NOT DETECTED Final   Candida tropicalis NOT DETECTED NOT DETECTED Final   Cryptococcus neoformans/gattii NOT DETECTED NOT DETECTED Final   Meth resistant mecA/C and MREJ NOT DETECTED NOT DETECTED Final    Comment: Performed at [facility], [address] [place], [place] [address]  Culture, blood (Routine X 2) w Reflex to ID Panel     Status: None (Preliminary result)   Collection Time: 05/20/20  4:42 PM   Specimen: BLOOD  Result Value Ref Range Status   Specimen Description BLOOD BLOOD LEFT WRIST  Final   Special Requests   Final    BOTTLES DRAWN AEROBIC AND ANAEROBIC Blood Culture adequate volume   Culture   Final    NO GROWTH 3 DAYS Performed at [facility], [address] [place], [place] [address]    Report Status PENDING  Incomplete  Culture, blood (Routine X 2) w Reflex to ID Panel     Status: None (Preliminary result)   Collection Time: 05/20/20  4:46 PM   Specimen: BLOOD  Result Value Ref Range Status   Specimen Description BLOOD LEFT ANTECUBITAL  Final   Special Requests   Final    BOTTLES DRAWN AEROBIC AND ANAEROBIC Blood Culture results may not be optimal due to an excessive volume of blood received in  culture bottles   Culture   Final    NO GROWTH 3  DAYS Performed at [facility], [address], [place], [place] [address]    Report Status PENDING  Incomplete         Radiology Studies: No results found.      Scheduled Meds: . aspirin EC  81 mg Oral Daily  . atorvastatin  10 mg Oral Daily  . enoxaparin (LOVENOX) injection  40 mg Subcutaneous Q24H  . insulin aspart  0-15 Units Subcutaneous TID WC  . insulin detemir  10 Units Subcutaneous QHS  . mometasone-formoterol  2 puff Inhalation BID   And  . umeclidinium bromide  1 puff Inhalation Daily  . nicotine  21 mg Transdermal Daily  . polyethylene glycol  17 g Oral BID  . potassium chloride  40 mEq Oral Once  . senna-docusate  1 tablet Oral QHS   Continuous Infusions: . sodium chloride 250 mL (05/19/20 0507)  . sodium chloride 75 mL/hr at 05/23/20 0049  . clindamycin (CLEOCIN) IV    . famotidine (PEPCID) IV    . nafcillin (NAFCIL) continuous infusion 20.8 mL/hr at 05/23/20 0317     LOS: 5 days    Time spent: 25 minutes    Sidney Ace, MD [facility]  If 7PM-7AM, please contact night-coverage 05/23/2020, 2:20 PM

## 2020-05-24 ENCOUNTER — Encounter
Admission: EM | Disposition: A | Discharge status: Hospice | Payer: Self-pay | Source: Home / Self Care | Attending: Internal Medicine

## 2020-05-24 ENCOUNTER — Inpatient Hospital Stay: Payer: Medicare Other

## 2020-05-24 ENCOUNTER — Encounter: Payer: Self-pay | Admitting: Vascular Surgery

## 2020-05-24 LAB — CBC
HCT: 45.2 % (ref 39.0–52.0)
Hemoglobin: 15.3 g/dL (ref 13.0–17.0)
MCH: 29.4 pg (ref 26.0–34.0)
MCHC: 33.8 g/dL (ref 30.0–36.0)
MCV: 86.9 fL (ref 80.0–100.0)
Platelets: 265 10*3/uL (ref 150–400)
RBC: 5.2 MIL/uL (ref 4.22–5.81)
RDW: 13.2 % (ref 11.5–15.5)
WBC: 15.6 10*3/uL — ABNORMAL HIGH (ref 4.0–10.5)
nRBC: 0 % (ref 0.0–0.2)

## 2020-05-24 LAB — BLOOD GAS, ARTERIAL
Acid-Base Excess: 10.2 mmol/L — ABNORMAL HIGH (ref 0.0–2.0)
Bicarbonate: 34.3 mmol/L — ABNORMAL HIGH (ref 20.0–28.0)
Drawn by: 187461
FIO2: 0.28
O2 Content: 2 L/min
O2 Saturation: 94.1 %
Patient temperature: 37
pCO2 arterial: 42 mmHg (ref 32.0–48.0)
pH, Arterial: 7.52 — ABNORMAL HIGH (ref 7.350–7.450)
pO2, Arterial: 60 mmHg — ABNORMAL LOW (ref 83.0–108.0)

## 2020-05-24 LAB — BASIC METABOLIC PANEL
Anion gap: 13 (ref 5–15)
BUN: 21 mg/dL (ref 8–23)
CO2: 31 mmol/L (ref 22–32)
Calcium: 7.4 mg/dL — ABNORMAL LOW (ref 8.9–10.3)
Chloride: 96 mmol/L — ABNORMAL LOW (ref 98–111)
Creatinine, Ser: 1 mg/dL (ref 0.61–1.24)
GFR calc Af Amer: >60 mL/min (ref >60)
GFR calc non Af Amer: >60 mL/min (ref >60)
Glucose, Bld: 117 mg/dL — ABNORMAL HIGH (ref 70–99)
Potassium: 2.6 mmol/L — CL (ref 3.5–5.1)
Sodium: 140 mmol/L (ref 135–145)

## 2020-05-24 LAB — GLUCOSE, CAPILLARY
Glucose-Capillary: 89 mg/dL (ref 70–99)
Glucose-Capillary: 68 mg/dL — ABNORMAL LOW (ref 70–99)
Glucose-Capillary: 105 mg/dL — ABNORMAL HIGH (ref 70–99)
Glucose-Capillary: 90 mg/dL (ref 70–99)
Glucose-Capillary: 141 mg/dL — ABNORMAL HIGH (ref 70–99)
Glucose-Capillary: 119 mg/dL — ABNORMAL HIGH (ref 70–99)

## 2020-05-24 SURGERY — ECHOCARDIOGRAM, TRANSESOPHAGEAL
Anesthesia: General

## 2020-05-24 MED ORDER — MAGNESIUM SULFATE 2 GM/50ML IV SOLN
2.0000 g | Freq: Once | INTRAVENOUS | Status: AC
  Administered 2020-05-24: 11:00:00 2 g via INTRAVENOUS
  Filled 2020-05-24: qty 50

## 2020-05-24 MED ORDER — ALPRAZOLAM 0.25 MG PO TABS
0.2500 mg | ORAL_TABLET | Freq: Three times a day (TID) | ORAL | Status: DC | PRN
  Administered 2020-05-24 – 2020-05-25 (×2): 0.25 mg via ORAL
  Filled 2020-05-24 (×2): qty 1

## 2020-05-24 MED ORDER — IPRATROPIUM-ALBUTEROL 0.5-2.5 (3) MG/3ML IN SOLN: 3.0000 mL | RESPIRATORY_TRACT | Status: DC | PRN

## 2020-05-24 MED ORDER — RAMELTEON 8 MG PO TABS
8.0000 mg | ORAL_TABLET | Freq: Every day | ORAL | Status: DC
  Administered 2020-05-24 – 2020-05-29 (×5): 8 mg via ORAL
  Filled 2020-05-24 (×6): qty 1

## 2020-05-24 MED ORDER — POTASSIUM CHLORIDE 10 MEQ/100ML IV SOLN
10.0000 meq | INTRAVENOUS | Status: AC
  Administered 2020-05-24 (×4): 10 meq via INTRAVENOUS
  Filled 2020-05-24 (×2): qty 100

## 2020-05-24 MED ORDER — MORPHINE SULFATE (PF) 2 MG/ML IV SOLN
1.0000 mg | Freq: Once | INTRAVENOUS | Status: AC
  Administered 2020-05-24: 1 mg via INTRAVENOUS
  Filled 2020-05-24: qty 1

## 2020-05-24 MED ORDER — POTASSIUM CHLORIDE CRYS ER 20 MEQ PO TBCR
60.0000 meq | EXTENDED_RELEASE_TABLET | Freq: Once | ORAL | Status: AC
  Administered 2020-05-24: 11:00:00 60 meq via ORAL
  Filled 2020-05-24: qty 3

## 2020-05-24 MED ORDER — SODIUM CHLORIDE 0.9 % IV BOLUS
500.0000 mL | Freq: Once | INTRAVENOUS | Status: AC
  Administered 2020-05-24: 15:00:00 500 mL via INTRAVENOUS

## 2020-05-24 MED ORDER — SODIUM CHLORIDE 0.9 % IV SOLN: INTRAVENOUS | Status: AC

## 2020-05-24 MED ORDER — FUROSEMIDE 10 MG/ML IJ SOLN
40.0000 mg | Freq: Once | INTRAMUSCULAR | Status: DC
  Filled 2020-05-24: qty 4

## 2020-05-24 NOTE — Progress Notes (Signed)
This note also relates to the following rows which could not be included: Pulse Rate - Cannot attach notes to unvalidated device data ECG Heart Rate - Cannot attach notes to unvalidated device data Resp - Cannot attach notes to unvalidated device data SpO2 - Cannot attach notes to unvalidated device data    05/24/20 2018  Assess: if the MEWS score is Yellow or Red  Were vital signs taken at a resting state? Yes  Focused Assessment No change from prior assessment  Early Detection of Sepsis Score *See Row Information* Low  MEWS guidelines implemented *See Row Information* No, previously yellow, continue vital signs every 4 hours  Treat  MEWS Interventions Other (Comment) Hassan Rowan, NP in to see pt )  Notify: Provider  Provider Name/Title Sharion Settler, NP  Date Provider Notified 05/24/20  Time Provider Notified 2015  Notification Type Face-to-face  Notification Reason Other (Comment) (Had NP to look in on pt while on the floor )  Response Other (Comment) (NP to bedside )  Date of Provider Response 05/24/20  Time of Provider Response 2015  Sharion Settler NP, was on floor and I requested her to look in on pt d/t respiratory rate. NP assessed pt and new orders were placed. Will continue to monitor per yellow MEWS protocol.

## 2020-05-24 NOTE — Progress Notes (Signed)
   05/24/20 1957  Assess: MEWS Score  O2 Device Nasal Cannula  O2 Flow Rate (L/min) 2 L/min  Assess: if the MEWS score is Yellow or Red  Were vital signs taken at a resting state? Yes  Focused Assessment No change from prior assessment  Early Detection of Sepsis Score *See Row Information* Low  MEWS guidelines implemented *See Row Information* No, previously yellow, continue vital signs every 4 hours  Treat  Pain Scale 0-10  Pain Score 0  Notify: Charge Nurse/RN  Name of Charge Nurse/RN Notified Siri Cole, RN  Date Charge Nurse/RN Notified 05/24/20  Time Charge Nurse/RN Notified 1959  Pt continues to be yellow MEWS d/t respiratory rate. Will continue to monitor q 4 hrs as ordered

## 2020-05-24 NOTE — Progress Notes (Signed)
[facility] Daily Progress Note  Subjective: 05/23/20: 1. Ultrasound guidance vascular access to right femoral artery. 2. Catheter placement to left brachial artery from right femoral approach 3. Thoracic aortogram and selective left upper extremity angiogram 4.  Covered stent placement to left subclavian artery with 7 mm diameter by 26 mm length lifestream stent 5. StarClose closure device right femoral artery.  Patient without complaint this afternoon.  Lethargic but states his finger discomfort is improved.  No issues overnight.  Objective: Vitals:   05/23/20 2331 05/24/20 0347 05/24/20 0906 05/24/20 1311  BP: (!) 160/56 (!) 143/58 (!) 168/77 (!) 153/59  Pulse: 75 79 80 80  Resp: 20 20 16  (!) 25  Temp: 98 F (36.7 C) 98.2 F (36.8 C) 97.8 F (36.6 C) (!) 97.5 F (36.4 C)  TempSrc: Oral Oral Oral Oral  SpO2: 95% 92% 93% 92%  Weight:      Height:        Intake/Output Summary (Last 24 hours) at 05/24/2020 1413 Last data filed at 05/24/2020 0600 Gross per 24 hour  Intake 1039.27 ml  Output 200 ml  Net 839.27 ml   Physical Exam: A&Ox1, NAD CV: RRR Pulmonary: CTA Bilaterally Abdomen: Soft, Non-tender, Non-distended Right Groin: PAD in place.  No swelling or drainage. Vascular:  Left Upper Extremity: 2+ radial pulse, in provement in regard to erythema to the fourth left finger.  Base of finger now normal in color.  Tip is erythematous.  Less tender to palpation.  Skin intact.   Laboratory: CBC    Component Value Date/Time   WBC 15.6 (H) 05/24/2020 0626   HGB 15.3 05/24/2020 0626   HCT 45.2 05/24/2020 0626   PLT 265 05/24/2020 0626   BMET    Component Value Date/Time   NA 140 05/24/2020 0626   K 2.6 (LL) 05/24/2020 0626   CL 96 (L) 05/24/2020 0626   CO2 31 05/24/2020 0626   GLUCOSE 117 (H) 05/24/2020 0626   BUN 21 05/24/2020 0626   CREATININE 1.00 05/24/2020 0626   CREATININE 1.11 03/30/2020 1528   CALCIUM 7.4 (L) 05/24/2020 0626    GFRNONAA >60 05/24/2020 0626   GFRNONAA 65 05/17/2019 1557   GFRAA >60 05/24/2020 0626   GFRAA 75 05/17/2019 1557   Assessment/Planning: The patient is a 78 year old male with multiple medical issues including 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 Ellis Parents Rakeisha Nyce PA-C 05/24/2020 2:13 PM

## 2020-05-24 NOTE — Significant Event (Addendum)
Rapid Response Event Note   Reason for Call : Increased work of breathing   Initial Focused Assessment: Patient alert and awake. Following commands.  Patient with noted increased work of breathing.  BP 150/70, HR 81, sp02 92 on 2L nasal cannula,  RR 32.        Interventions: MD paged, CXR and ABG obtained. 513ml Normal saline bolus to be given.   Plan of Care: await ABG and CXR results    Event Summary: Patient's ABG and CXR stable.  Patient to remain in room 125.   MD Notified: Dr. Priscella Mann notified at 14:45  Call Time: 14:40 Arrival Time: 14:45 End Time: 15:08  Silver Huguenin, RN

## 2020-05-24 NOTE — Progress Notes (Signed)
PROGRESS NOTE    Jonathan [name]  YQM:[id number] DOB: 1941/04/22 DOA: 05/18/2020 PCP: Abner Greenspan, MD   Brief Narrative:  79 year old male who was recently discharged from the hospital after treatment for COVID-19 viral infection. Presented to ED for evaluation of worsening shortness of breath, nonproductive cough and wheezing and was found to be hypoxic with room air pulse oximetry at rest of 86%. Admitted for COPD exacerbation in the context of recent Covid infection.  COPD exacerbation resolved overnight.  Was found to have MSSA bacteremia which has become the dominant reason for ongoing hospitalization.  Initially refused TEE 8/23, but now agreeable to TEE 8/25.  Source of bacteremia unclear, could be phenomenon related to Covid.  Developed spontaneous left ring finger erythema distally of unclear significance.  Orthopedics following.  Seen by orthopedics, infectious disease and vascular surgery.  Plan for angiogram 8/25 to exclude vascular pathology.   Admit 8/13 to 8/16 for COVID tx w/ remdesivir, steroids. Discharged on steroid taper and compelte outpt remdesivir.   8/13 ED, hypoxic, admitted for COVID  8/12 ED, COVID+, received MAB and was d/c from ED  8/25: Patient seen and examined. Little bit irritated this morning about being n.p.o. Communicated with cardiology this morning. Patient was scheduled for TEE however specials refused to take patient down as he is currently on airborne isolation. Spoke with infection prevention on the floor. Patient's last test was 05/10/2020. He is asymptomatic from a Covid standpoint. No oxygen requirement, no evidence of pneumonia on CT, no fevers. Okay to DC airborne isolation  Communicated with vascular surgery. Plans for angiography of left ring finger today.   8/26: Patient seen and examined.  Irritable this morning.  Apparently his TEE was canceled overnight for reasons that are unclear.  Apparently given concerns for Covid status which was  known by all involved providers.  Notified cardiology this morning.  Case has been rescheduled for 05/25/2020  Assessment & Plan:   Principal Problem:   Bacteremia due to Staphylococcus aureus Active Problems:   Tobacco abuse   Physical deconditioning   Type 2 diabetes mellitus with hyperglycemia, with long-term current use of insulin (HCC)   COVID-19 virus infection   COPD with acute exacerbation (HCC)   Closed rib fracture   Lung mass   Aortic atherosclerosis (HCC)   Emphysema lung (HCC)   Finger pain, left  Bacteremia due to Staphylococcus aureus --remains afebrile, vital signs stable. --sepsis ruled out --Source remains unclear.  Remote neck surgery.  No other hardware.  No drug use.  Skin appears unremarkable except for distal left ring finger, which has circumferential erythema. Tender but no abscess or lesions. Xray probably nonacute.  Plan: Continue Ancef Follow surveillance cultures, no growth to date ID following, recommendations appreciated Plan for TEE 05/25/2020 Airborne isolation discontinued Please do not cancel TEE  Finger pain, left --xray nonacute, etiology unclear; may be Covid phenomenon,  --Appreciate orthopedic evaluation.  Will continue to follow.  Etiology unclear. --Status post angiography with left subclavian stent placement on 05/23/2020  Type 2 diabetes mellitus with hyperglycemia, with long-term current use of insulin (HCC) -Good control over interval. Basal bolus regimen  COPD with acute exacerbation (HCC) with acute hypoxic respiratory failure --resolved. On RA --Continue bronchodilators.  Continue supplemental oxygen.  COVID-19 virus infection --Status post treatment last hospitalization with remdesivir and steroids.  Appears asymptomatic at this time.  Also received monoclonal antibody infusion 8/12. --Asymptomatic. --Positive test 05/10/2020. Communicated with infection prevention. DC airborne isolation precautions Patient is out  of  10-day window required for isolation.  Closed rib fracture --Minimally displaced right anterolateral seventh rib fracture, with small surrounding hematoma. No evidence of hemothorax or pneumothorax.  Monitor clinically. 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 PET-CT is recommended for further Evaluation. --f/u as outpt -- I discussed w/ wife and daughter by telephone 8/22  Aortic atherosclerosis ([place]) --continue statin  Emphysema lung ([place]) --as per COPD   DVT prophylaxis: Lovenox Code Status: Full Family Communication: Wife [phone] on 05/23/2020 Disposition Plan: Status is: Inpatient  Remains inpatient appropriate because:Inpatient level of care appropriate due to severity of illness   Dispo: The patient is from: Home              Anticipated d/c is to: Home              Anticipated d/c date is: 3 days              Patient currently is not medically stable to d/c.  Continue work-up for staphylococcal bacteremia.  Plan for TEE tomorrow 05/25/2020   Consultants:   Vascular surgery  Cardiology  Procedures:   2D echocardiogram  Antimicrobials:   Ancef   Subjective: Patient seen and examined.  Frustrated about continued hospital stay.  Keeps stating he wants to get out of here.  Objective: Vitals:   05/23/20 2331 05/24/20 0347 05/24/20 0906 05/24/20 1311  BP: (!) 160/56 (!) 143/58 (!) 168/77 (!) 153/59  Pulse: 75 79 80 80  Resp: 20 20 16  (!) 25  Temp: 98 F (36.7 C) 98.2 F (36.8 C) 97.8 F (36.6 C) (!) 97.5 F (36.4 C)  TempSrc: Oral Oral Oral Oral  SpO2: 95% 92% 93% 92%  Weight:      Height:        Intake/Output Summary (Last 24 hours) at 05/24/2020 1355 Last data filed at 05/24/2020 0600 Gross per 24 hour  Intake 1039.27 ml  Output 200 ml  Net 839.27 ml   Filed Weights   05/18/20 1213 05/23/20 1549  Weight: 71.2 kg 71.2 kg     Examination:   General: No apparent distress, patient appears well HEENT: Normocephalic, atraumatic Neck, supple, trachea midline, no tenderness Heart: Regular rate and rhythm, S1/S2 normal, no murmurs Lungs: Clear to auscultation bilaterally, no adventitious sounds, normal work of breathing Abdomen: Soft, nontender, nondistended, positive bowel sounds Extremities: Normal, atraumatic, no clubbing or cyanosis, normal muscle tone Skin: No rashes or lesions, normal color Neurologic: Cranial nerves grossly intact, sensation intact, alert and oriented x3 Psychiatric: Irritable   Data Reviewed: I have personally reviewed following labs and imaging studies  CBC: Recent Labs  Lab 05/18/20 1220 05/18/20 1220 05/19/20 0510 05/20/20 0623 05/21/20 0537 05/22/20 0435 05/24/20 0626  WBC 24.2*   < > 29.1* 28.5* 28.0* 21.5* 15.6*  NEUTROABS 20.8*  --   --   --   --   --   --   HGB 17.7*   < > 16.5 16.5 17.8* 16.2 15.3  HCT 52.9*   < > 47.9 47.1 52.4* 47.6 45.2  MCV 87.7   < > 85.1 85.3 84.9 85.9 86.9  PLT 329   < > 319 305 289 275 265   < > = values in this interval not displayed.   Basic Metabolic Panel: Recent Labs  Lab 05/18/20 1220 05/19/20 0510 05/20/20 0623 05/23/20 0655 05/24/20 0626  NA 135 136  --  141 140  K 3.5 3.1*  --  2.6* 2.6*  CL 90* 92*  --  94* 96*  CO2 27 31  --  36* 31  GLUCOSE 285* 204*  --  71 117*  BUN 24* 23  --  21 21  CREATININE 1.05 0.91  --  1.00 1.00  CALCIUM 8.7* 8.6*  --  7.9* 7.4*  MG  --   --  2.1  --   --    GFR: Estimated Creatinine Clearance: 52.1 mL/min (by C-G formula based on SCr of 1 mg/dL). Liver Function Tests: Recent Labs  Lab 05/18/20 1220  AST 24  ALT 20  ALKPHOS 87  BILITOT 2.0*  PROT 7.2  ALBUMIN 2.8*   No results for input(s): LIPASE, AMYLASE in the last 168 hours. No results for input(s): AMMONIA in the last 168 hours. Coagulation Profile: No results for input(s): INR, PROTIME in the last 168 hours. Cardiac  Enzymes: No results for input(s): CKTOTAL, CKMB, CKMBINDEX, TROPONINI in the last 168 hours. BNP (last 3 results) No results for input(s): PROBNP in the last 8760 hours. HbA1C: No results for input(s): HGBA1C in the last 72 hours. CBG: Recent Labs  Lab 05/23/20 1826 05/23/20 1950 05/24/20 0904 05/24/20 1249 05/24/20 1347  GLUCAP 126* 134* 89 68* 105*   Lipid Profile: No results for input(s): CHOL, HDL, LDLCALC, TRIG, CHOLHDL, LDLDIRECT in the last 72 hours. Thyroid Function Tests: No results for input(s): TSH, T4TOTAL, FREET4, T3FREE, THYROIDAB in the last 72 hours. Anemia Panel: No results for input(s): VITAMINB12, FOLATE, FERRITIN, TIBC, IRON, RETICCTPCT in the last 72 hours. Sepsis Labs: Recent Labs  Lab 05/18/20 1220 05/18/20 1644  PROCALCITON 0.29  --   LATICACIDVEN 2.6* 2.2*    Recent Results (from the past 240 hour(s))  Blood Culture (routine x 2)     Status: Abnormal   Collection Time: 05/18/20 12:27 PM   Specimen: BLOOD  Result Value Ref Range Status   Specimen Description   Final    BLOOD BLOOD LEFT HAND Performed at [facility], [address] [place], [place] [address]    Special Requests   Final    BOTTLES DRAWN AEROBIC AND ANAEROBIC Blood Culture adequate volume Performed at [facility], [address] [place], [place] [address]    Culture  Setup Time   Final    Organism ID to follow [place] AEROBIC AND ANAEROBIC BOTTLES CRITICAL RESULT CALLED TO, READ BACK BY AND VERIFIED WITH: SCOTT HALL 05/19/20 AT 0010 HS Performed at [facility], [address] [place],  [address]    Culture STAPHYLOCOCCUS AUREUS (A)  Final   Report Status 05/21/2020 FINAL  Final   Organism ID, Bacteria STAPHYLOCOCCUS AUREUS  Final      Susceptibility   Staphylococcus aureus - MIC*    CIPROFLOXACIN <=0.5 SENSITIVE Sensitive     ERYTHROMYCIN >=8 RESISTANT Resistant     GENTAMICIN <=0.5 SENSITIVE Sensitive      OXACILLIN 0.5 SENSITIVE Sensitive     TETRACYCLINE <=1 SENSITIVE Sensitive     VANCOMYCIN <=0.5 SENSITIVE Sensitive     TRIMETH/SULFA <=10 SENSITIVE Sensitive     CLINDAMYCIN <=0.25 SENSITIVE Sensitive     RIFAMPIN <=0.5 SENSITIVE Sensitive     Inducible Clindamycin NEGATIVE Sensitive     * STAPHYLOCOCCUS AUREUS  Blood Culture (routine x 2)     Status: Abnormal   Collection Time: 05/18/20 12:27 PM   Specimen: BLOOD  Result Value Ref Range Status  Specimen Description   Final    BLOOD BLOOD LEFT FOREARM Performed at [facility], [address], [place], [place] [address]    Special Requests   Final    BOTTLES DRAWN AEROBIC AND ANAEROBIC Blood Culture adequate volume Performed at [facility], [address] [place], [place] [address]    Culture  Setup Time   Final    GRAM POSITIVE COCCI IN BOTH AEROBIC AND ANAEROBIC BOTTLES CRITICAL VALUE NOTED.  VALUE IS CONSISTENT WITH PREVIOUSLY REPORTED AND CALLED VALUE. Performed at [facility], [address] [place], [place] [address]    Culture (A)  Final    STAPHYLOCOCCUS AUREUS SUSCEPTIBILITIES PERFORMED ON PREVIOUS CULTURE WITHIN THE LAST 5 DAYS. Performed at [facility], [place] [address] [place], [place] [address]    Report Status 05/21/2020 FINAL  Final  Blood Culture ID Panel (Reflexed)     Status: Abnormal   Collection Time: 05/18/20 12:27 PM  Result Value Ref Range Status   Enterococcus faecalis NOT DETECTED NOT DETECTED Final   Enterococcus Faecium NOT DETECTED NOT DETECTED Final   Listeria monocytogenes NOT DETECTED NOT DETECTED Final   Staphylococcus species DETECTED (A) NOT DETECTED Final    Comment: CRITICAL RESULT CALLED TO, READ BACK BY AND VERIFIED WITH: SCOTT HALL 05/19/20 AT 0010 HS    Staphylococcus aureus (BCID) DETECTED (A) NOT DETECTED Final    Comment: CRITICAL RESULT CALLED TO, READ BACK BY AND VERIFIED WITH: SCOTT HALL 05/19/20 AT 0010 HS    Staphylococcus epidermidis  NOT DETECTED NOT DETECTED Final   Staphylococcus lugdunensis NOT DETECTED NOT DETECTED Final   Streptococcus species NOT DETECTED NOT DETECTED Final   Streptococcus agalactiae NOT DETECTED NOT DETECTED Final   Streptococcus pneumoniae NOT DETECTED NOT DETECTED Final   Streptococcus pyogenes NOT DETECTED NOT DETECTED Final   A.calcoaceticus-baumannii NOT DETECTED NOT DETECTED Final   Bacteroides fragilis NOT DETECTED NOT DETECTED Final   Enterobacterales NOT DETECTED NOT DETECTED Final   Enterobacter cloacae complex NOT DETECTED NOT DETECTED Final   Escherichia coli NOT DETECTED NOT DETECTED Final   Klebsiella aerogenes NOT DETECTED NOT DETECTED Final   Klebsiella oxytoca NOT DETECTED NOT DETECTED Final   Klebsiella pneumoniae NOT DETECTED NOT DETECTED Final   Proteus species NOT DETECTED NOT DETECTED Final   Salmonella species NOT DETECTED NOT DETECTED Final   Serratia marcescens NOT DETECTED NOT DETECTED Final   Haemophilus influenzae NOT DETECTED NOT DETECTED Final   Neisseria meningitidis NOT DETECTED NOT DETECTED Final   Pseudomonas aeruginosa NOT DETECTED NOT DETECTED Final   Stenotrophomonas maltophilia NOT DETECTED NOT DETECTED Final   Candida albicans NOT DETECTED NOT DETECTED Final   Candida auris NOT DETECTED NOT DETECTED Final   Candida glabrata NOT DETECTED NOT DETECTED Final   Candida krusei NOT DETECTED NOT DETECTED Final   Candida parapsilosis NOT DETECTED NOT DETECTED Final   Candida tropicalis NOT DETECTED NOT DETECTED Final   Cryptococcus neoformans/gattii NOT DETECTED NOT DETECTED Final   Meth resistant mecA/C and MREJ NOT DETECTED NOT DETECTED Final    Comment: Performed at [facility], [address] [place], [place] [address]  Culture, blood (Routine X 2) w Reflex to ID Panel     Status: None (Preliminary result)   Collection Time: 05/20/20  4:42 PM   Specimen: BLOOD  Result Value Ref Range Status   Specimen Description BLOOD BLOOD LEFT WRIST  Final    Special Requests   Final    BOTTLES DRAWN  AEROBIC AND ANAEROBIC Blood Culture adequate volume   Culture   Final    NO GROWTH 4 DAYS Performed at [facility], [address] [place], [place] [address]    Report Status PENDING  Incomplete  Culture, blood (Routine X 2) w Reflex to ID Panel     Status: None (Preliminary result)   Collection Time: 05/20/20  4:46 PM   Specimen: BLOOD  Result Value Ref Range Status   Specimen Description BLOOD LEFT ANTECUBITAL  Final   Special Requests   Final    BOTTLES DRAWN AEROBIC AND ANAEROBIC Blood Culture results may not be optimal due to an excessive volume of blood received in culture bottles   Culture   Final    NO GROWTH 4 DAYS Performed at [facility], [address], [place], [place] [address]    Report Status PENDING  Incomplete         Radiology Studies: PERIPHERAL VASCULAR CATHETERIZATION  Result Date: 05/23/2020 See op note       Scheduled Meds: . aspirin EC  81 mg Oral Daily  . atorvastatin  10 mg Oral Daily  . clopidogrel  75 mg Oral Daily  . enoxaparin (LOVENOX) injection  40 mg Subcutaneous Q24H  . insulin aspart  0-15 Units Subcutaneous TID WC  . insulin detemir  10 Units Subcutaneous QHS  . mometasone-formoterol  2 puff Inhalation BID   And  . umeclidinium bromide  1 puff Inhalation Daily  . nicotine  21 mg Transdermal Daily  . polyethylene glycol  17 g Oral BID  . potassium chloride  40 mEq Oral Once  . senna-docusate  1 tablet Oral QHS   Continuous Infusions: . sodium chloride Stopped (05/21/20 1607)  . sodium chloride Stopped (05/23/20 1619)  . famotidine (PEPCID) IV    . nafcillin (NAFCIL) continuous infusion 20.8 mL/hr at 05/23/20 1403     LOS: 6 days    Time spent: 25 minutes    Sidney Ace, MD [facility]  If 7PM-7AM, please contact night-coverage 05/24/2020, 1:55 PM

## 2020-05-24 NOTE — Care Management (Signed)
RRT called for increased work of breathing.  MD responded to bedside.  Charge nurse, ICU nurse and bedside nurse all at bedside.  Vital signs patient assessed.  Vital signs stable.  Saturations low to mid nineties on 2 L nasal cannula.  Blood pressure 150/70.  Heart rate appropriate.  Patient does have increased work of breathing.  Level consciousness difficult to ascertain.  Initially there was concern for decreased level consciousness however patient when pressed does answer all questions appropriately.  ABG and chest x-ray performed.  ABG demonstrating respiratory alkalosis consistent with volume contraction and hyperventilation.  Chest x-ray showed stable COPD without fluid overload.  Patient has been expressing thirst.  Will bolus 500 cc normal saline and start maintenance fluids, normal saline 100 cc/h x 10 hours.  Went back approximately an hour after to reevaluate and discussed with patient.  He remains a bit irritable however his level consciousness is much improved.  He again expresses desire to get out of here and states that staying here is "making him crazy".  I discussed with the nurse.  Will attempt low-dose p.o. Xanax for anxiety control.  We will continue to monitor carefully.  Plan for TEE in place tomorrow.  N.p.o. after midnight.  Ralene Muskrat MD

## 2020-05-24 NOTE — Progress Notes (Signed)
PT Cancellation Note  Patient Details Name: Jonathan [name] MRN: [id number] DOB: 1941-09-29   Cancelled Treatment:     Pt had rapid response called  and has lab values outside PT parameters. Will continue to follow and return at later date/time.   Willette Pa 05/24/2020, 3:58 PM

## 2020-05-24 NOTE — Progress Notes (Signed)
Date of Admission:  05/18/2020 79 y.o. male with a history of COPD, diabetes mellitus, hyperlipidemia, hypertension, recently diagnosed Covid is admitted with shortness of breath. Patient initially presented to [facility] long ED on 05/10/2020 with multiple falls the past 24 hours.  He also was having cough for a few days.  His partner had tested positive for Covid a week ago.  So he was given monoclonal antibodies and initially the partner did not want him back home because She was not well.  Case manager was involved and after talking to his daughter patient was discharged home.  But his partner refused to take him because she could not care for him and hence he was sent back to the ED on 05/11/2020.[name]  He was admitted to the hospital to receive remdesivir and steroids because of hypoxia.  He received 3 days of remdesivir and was discharged and asked to continue that at the infusion clinic.  He came back to [facility] ED on 05/18/2020 complaining of increasing shortness of breath, generalized weakness and feeling unwell.  He was having a cough which was nonproductive.  Did not have any fever or chills.  In the ED the pulse ox was 86% on room air.  Temperature was 97.7, BP 132/76, heart rate of 105.  Labs revealed WBC of 24.2, hemoglobin of 17.7 and platelet count of 329.  Ferritin was 833, CRP was 30.9, lactate 2.6, procalcitonin 0.29, and creatinine was 1.05.blood culture is positive for staph aureus and I saw the patient for the same.Pt is confused and cannot give history    Subjective: Patient Vitals for the past 24 hrs:  BP Temp Temp src Pulse Resp SpO2  05/24/20 2008 (!) 117/44 (!) 97.1 F (36.2 C) Oral 89 (!) 48 94 %  05/24/20 1655 (!) 152/75 97.7 F (36.5 C) -- 81 (!) 28 91 %  05/24/20 1540 (!) 153/62 97.6 F (36.4 C) Oral -- (!) 25 92 %  05/24/20 1426 (!) 163/56 97.7 F (36.5 C) Oral 74 -- 93 %  05/24/20 1311 (!) 153/59 (!) 97.5 F (36.4 C) Oral 80 (!) 25 92 %  05/24/20  0906 (!) 168/77 97.8 F (36.6 C) Oral 80 16 93 %  05/24/20 0347 (!) 143/58 98.2 F (36.8 C) Oral 79 20 92 %  05/23/20 2331 (!) 160/56 98 F (36.7 C) Oral 75 20 95 %      Medications:   aspirin EC  81 mg Oral Daily   atorvastatin  10 mg Oral Daily   clopidogrel  75 mg Oral Daily   enoxaparin (LOVENOX) injection  40 mg Subcutaneous Q24H   furosemide  40 mg Intravenous Once   insulin aspart  0-15 Units Subcutaneous TID WC   insulin detemir  10 Units Subcutaneous QHS   mometasone-formoterol  2 puff Inhalation BID   And   umeclidinium bromide  1 puff Inhalation Daily   nicotine  21 mg Transdermal Daily   polyethylene glycol  17 g Oral BID   potassium chloride  40 mEq Oral Once   ramelteon  8 mg Oral QHS   senna-docusate  1 tablet Oral QHS    Objective: Vital signs in last 24 hours: Temp:  [97.1 F (36.2 C)-98.2 F (36.8 C)] 97.1 F (36.2 C) (08/26 2008) Pulse Rate:  [74-89] 89 (08/26 2008) Resp:  [16-48] 48 (08/26 2008) BP: (117-168)/(44-77) 117/44 (08/26 2008) SpO2:  [91 %-95 %] 94 % (08/26 2008)  PHYSICAL EXAM:  General: awake, , responds  to questions but confused , not cooperative Lying on his left side Lungs: b/l air entry- crepts Heart: Regular rate and rhythm, no murmur, rub or gallop. Abdomen: did not examine Extremities: left hand- 3, 4, 5 digits looking ischemic    Skin: No rashes or lesions. Or bruising Lymph: Cervical, supraclavicular normal. Neurologic: Grossly non-focal  Lab Results Recent Labs    05/22/20 0435 05/23/20 0655 05/24/20 0626  WBC 21.5*  --  15.6*  HGB 16.2  --  15.3  HCT 47.6  --  45.2  NA  --  141 140  K  --  2.6* 2.6*  CL  --  94* 96*  CO2  --  36* 31  BUN  --  21 21  CREATININE  --  1.00 1.00  Microbiology: 05/18/20 [facility] 4/4/ MSSA 8/22 BC- NG  Echo 8/21 - Studies/Results: PERIPHERAL VASCULAR CATHETERIZATION  Result Date: 05/23/2020 See op note  DG Chest Port 1 View  Result Date: 05/24/2020 CLINICAL  DATA:  Hypoxia, COVID EXAM: PORTABLE CHEST 1 VIEW COMPARISON:  05/20/2020.  CT 05/18/2020 FINDINGS: There is hyperinflation of the lungs compatible with COPD. Heart is normal size. Interstitial thickening again noted, unchanged. No effusions or pneumothorax. Pleural based density noted peripherally at the right lung base, shown to be hematoma surrounding and anterior right 7th rib fracture on prior CT. IMPRESSION: COPD.  Stable interstitial prominence without confluent opacity. Electronically Signed   By: Rolm Baptise M.D.   On: 05/24/2020 15:10     Assessment/Plan:  MSSA bacteremia- unclear source- has been in and out of  hospital since 8/12 On Nafcilin and leucocytosis improving Need TEE May need to image his left shoulder with MRI if pain persist ( I was not able to evaluate him well as he was not cooperative)  Multiple ischemic digits left hand- angiogram done on 05/23/20 revealed significant stenosis of the left subclavian artery just distal to the vertebral artery by few centimeters.   an ulcerated 75% lesion seen and it was stented by Dr.Dew- on heparin. Is this atherosclerosis or due to covid or does staph aureus have a role ?  Covid illness- because of ongoing resp distress and need for oxygen with underlying COPD will not remove airborne now-  recommend repeating test next week to check ct value Informed the nurse to put him back on  airborne /contact .   COPD/fracture rib due to fall/hematoma Spiculated lesion of 1.4 cm lung   Discussed with his nurse Dr.Fitzgerald will follow him from tomorrow

## 2020-05-24 NOTE — Progress Notes (Signed)
Per infectious disease MD, place pt back on airborne and contact isolation for COVID. Pt was positive on 12th and still having respiratory symptoms.

## 2020-05-24 NOTE — Progress Notes (Signed)
OT Cancellation Note  Patient Details Name: Jonathan [name] MRN: [id number] DOB: May 06, 1941   Cancelled Treatment:    Reason Eval/Treat Not Completed: Medical issues which prohibited therapy . OT continues to follow this pt for treatment. Upon chart review this date he is noted with most recent K+ value of 2.6, which puts him outside safe parameters recommended for therapy. Will continue to follow remotely and treat as available/pt medically appropriate.   Shara Blazing, M.S., OTR/L Ascom: [phone] 05/24/20, 2:26 PM

## 2020-05-24 NOTE — Progress Notes (Addendum)
TEE rescheduled to 05/25/20 at 8:00AM.  --------------------------     Received report this AM that the transesophageal echocardiogram was canceled overnight due to concern regarding this pt's COVID-19 status.  Since then, the pt has been drinking, placing him at risk for aspiration during the transesophageal echocardiogram.  Will make n.p.o. tonight in preparation for rescheduled transesophageal echocardiogram to take place 05/25/2020.    N.p.o. /TEE orders should not be canceled.  The patient tested for positive for COVID-19 earlier in admission.   He is officially 10 days outside the contact precautions window.  Signed, Arvil Chaco, PA-C 05/24/2020, 8:54 AM

## 2020-05-25 ENCOUNTER — Encounter: Payer: Self-pay | Admitting: Registered Nurse

## 2020-05-25 ENCOUNTER — Encounter
Admission: EM | Disposition: A | Discharge status: Hospice | Payer: Medicare Other | Source: Home / Self Care | Attending: Internal Medicine

## 2020-05-25 DIAGNOSIS — I998 Other disorder of circulatory system: Secondary | ICD-10-CM

## 2020-05-25 DIAGNOSIS — Z515 Encounter for palliative care: Secondary | ICD-10-CM

## 2020-05-25 DIAGNOSIS — J441 Chronic obstructive pulmonary disease with (acute) exacerbation: Secondary | ICD-10-CM

## 2020-05-25 DIAGNOSIS — Z7189 Other specified counseling: Secondary | ICD-10-CM

## 2020-05-25 DIAGNOSIS — R54 Age-related physical debility: Secondary | ICD-10-CM

## 2020-05-25 LAB — CREATININE, SERUM
Creatinine, Ser: 0.84 mg/dL (ref 0.61–1.24)
GFR calc Af Amer: >60 mL/min (ref >60)
GFR calc non Af Amer: >60 mL/min (ref >60)

## 2020-05-25 LAB — CULTURE, BLOOD (ROUTINE X 2)
Culture: NO GROWTH
Culture: NO GROWTH
Special Requests: ADEQUATE

## 2020-05-25 LAB — GLUCOSE, CAPILLARY
Glucose-Capillary: 188 mg/dL — ABNORMAL HIGH (ref 70–99)
Glucose-Capillary: 161 mg/dL — ABNORMAL HIGH (ref 70–99)
Glucose-Capillary: 202 mg/dL — ABNORMAL HIGH (ref 70–99)
Glucose-Capillary: 79 mg/dL (ref 70–99)

## 2020-05-25 LAB — POTASSIUM: Potassium: 2.9 mmol/L — ABNORMAL LOW (ref 3.5–5.1)

## 2020-05-25 LAB — MAGNESIUM: Magnesium: 2.2 mg/dL (ref 1.7–2.4)

## 2020-05-25 SURGERY — ECHOCARDIOGRAM, TRANSESOPHAGEAL
Anesthesia: General

## 2020-05-25 MED ORDER — POTASSIUM CHLORIDE CRYS ER 20 MEQ PO TBCR: 40.0000 meq | EXTENDED_RELEASE_TABLET | Freq: Once | ORAL | Status: DC

## 2020-05-25 MED ORDER — POTASSIUM CHLORIDE CRYS ER 20 MEQ PO TBCR
60.0000 meq | EXTENDED_RELEASE_TABLET | Freq: Once | ORAL | Status: AC
  Administered 2020-05-25: 14:00:00 60 meq via ORAL
  Filled 2020-05-25: qty 3

## 2020-05-25 MED ORDER — PROSOURCE PLUS PO LIQD
30.0000 mL | Freq: Two times a day (BID) | ORAL | Status: DC
  Administered 2020-05-25 – 2020-05-29 (×5): 30 mL via ORAL
  Filled 2020-05-25 (×6): qty 30

## 2020-05-25 MED ORDER — ENSURE ENLIVE PO LIQD
237.0000 mL | Freq: Three times a day (TID) | ORAL | Status: DC
  Administered 2020-05-25 – 2020-05-26 (×4): 237 mL via ORAL

## 2020-05-25 MED ORDER — ADULT MULTIVITAMIN W/MINERALS CH
1.0000 | ORAL_TABLET | Freq: Every day | ORAL | Status: DC
  Administered 2020-05-25 – 2020-05-29 (×4): 1 via ORAL
  Filled 2020-05-25 (×4): qty 1

## 2020-05-25 NOTE — Progress Notes (Addendum)
Speech Language Pathology Treatment: Dysphagia  Patient Details Name: Jonathan [name] MRN: [id number] DOB: 1941/06/18 Today's Date: 05/25/2020 Time: 0092-3300 SLP Time Calculation (min) (ACUTE ONLY): 45 min  Assessment / Plan / Recommendation Clinical Impression  Pt seen for ongoing assessment of swallowing. She engaged easily w/ SLP and was verbally responsive and able to follow instructions w/ min cues. Pt is on [place] O2 support; afebrile currently. He continues to have multiple medical issues including Lung Mass in R lower lobe, R rib fx, Covid infection w/ complications per MD notes, Bacteremia w/ Staph. TEE once again canceled due to numerous issues including reinstatement of Covid precautions per ID, oxygen requirements of 2 L with baseline COPD, persistent hypokalemia, availability of special procedure staff. Any extended critical illness can increase weakness thus increase risk for aspiration for a pt. Patient was seen by palliative care for [place]. Discussed and explained general aspiration precautions w/ pt; he agreed verbally to the need for following them especially sitting upright for all oral intake. Pt assisted w/ positioning more upright and forward but seemed uncomfortable overall and wanted to lie on his side, then lie down. He stated he "didn't feel well"; NSG informed. Once positioned appropriately, pt then given trials of thin and bites of puree (ensure included). No overt clinical s/s of aspiration were noted w/ any consistency; respiratory status remained grossly unchanged or unlabored, vocal quality clear b/t trials. Pt was given Rest Breaks to calm breathing b/t trials d/t the Exertion from po intake/trials. Discussed that this was important for him to do when eating meals. Pt fed self drinking slowly w/ single, small sips. He exhibited no difficulty using a straw w/ liquids. NSG arrived for Pills; the Large ones are being dissolved in water currently but NSG explained option of Whole  w/ a Puree for "back-up" method also, especially larger pills. Oral phase appeared [facility] for bolus management and timely A-P transfer for swallowing; oral clearing achieved w/ all consistencies.  Recommend continue Dysphagia level 3 diet (mech soft for ease of mastication d/t Missing Dentition baseline) w/ gravies added to moisten foods; Thin liquids. Recommend general aspiration precautions; Pills Whole in Puree as needed; tray setup and positioning assistance for meals as needed. Rest Breaks w/ all meals/intake. NSG to reconsult ST services if new needs arise while admitted. NSG updated. Precautions posted at bedside.     HPI HPI: Pt is a 79 y.o. 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  He tested positive for SARS-CoV-2 and received Regeneron and was discharged home.  He returned to the emergency room because of recurrent falls and at that time was found to be hypoxic with pulse oximetry of 88% on room air.  Chest x-ray was consistent with his known history of COPD.  He was admitted to the hospital and was treated with remdesivir, Solu-Medrol and supportive care.  He was weaned off oxygen and was said to have ambulated on room air without any further episodes of hypoxia.  He was discharged home on a steroid taper and advised to complete his remdesivir dose as an outpatient.  Chest x-ray reviewed by me shows mild interstitial thickening bilaterally, probably indicative of a degree of chronic bronchitis. No edema or airspace opacity.  MD is treating pt for Bacteremia d/t Staph.  Unsure of pt's Baseline Cognitive status - no recent brain imaging but 2015 scan revealed diffuse white matter changes and  atrophy then.      SLP Plan  All goals met       Recommendations  Diet recommendations: Dysphagia 3 (mechanical soft);Thin liquid (cut meats, gravy added to moisten) Liquids provided via:  Cup;Straw (monitor) Medication Administration: Whole meds with puree (if easier for swallowing; dissolve or liquid form) Supervision: Patient able to self feed;Intermittent supervision to cue for compensatory strategies (Setup support) Compensations: Minimize environmental distractions;Slow rate;Small sips/bites;Lingual sweep for clearance of pocketing;Follow solids with liquid Postural Changes and/or Swallow Maneuvers: Seated upright 90 degrees;Out of bed for meals;Upright 30-60 min after meal                General recommendations:  (dietician f/u) Oral Care Recommendations: Oral care BID;Oral care before and after PO;Patient independent with oral care Follow up Recommendations: None SLP Visit Diagnosis: Dysphagia, unspecified (R13.10) (Pt is Missing Dentition) Plan: All goals met       GO                 Jonathan Kenner, MS, CCC-SLP , 05/25/2020, 3:36 PM

## 2020-05-25 NOTE — Progress Notes (Signed)
   05/24/20 1426  Assess: MEWS Score  Temp 97.7 F (36.5 C)  BP (!) 163/56  Pulse Rate 74  SpO2 93 %  O2 Device Nasal Cannula  O2 Flow Rate (L/min) 2 L/min  Assess: MEWS Score  MEWS Temp 0  MEWS Systolic 0  MEWS Pulse 0  MEWS RR 1  MEWS LOC 1  MEWS Score 2  MEWS Score Color Yellow  Assess: if the MEWS score is Yellow or Red  Were vital signs taken at a resting state? Yes  Focused Assessment Change from prior assessment (see assessment flowsheet)  Early Detection of Sepsis Score *See Row Information* Low  MEWS guidelines implemented *See Row Information* Yes  Treat  MEWS Interventions Escalated (See documentation below)  Take Vital Signs  Increase Vital Sign Frequency  Yellow: Q 2hr X 2 then Q 4hr X 2, if remains yellow, continue Q 4hrs  Escalate  MEWS: Escalate Yellow: discuss with charge nurse/RN and consider discussing with provider and RRT  Notify: Charge Nurse/RN  Name of Charge Nurse/RN Notified Debi Pinkerton  Date Charge Nurse/RN Notified 05/24/20  Time Charge Nurse/RN Notified 1430  Document  Patient Outcome Other (Comment) (will continue to monitor)

## 2020-05-25 NOTE — Progress Notes (Signed)
OT Cancellation Note  Patient Details Name: Jonathan [name] MRN: [id number] DOB: 1941/01/12   Cancelled Treatment:    Reason Eval/Treat Not Completed: Medical issues which prohibited therapy. Pt continues with low K+. Also low BP. Will hold OT tx and re-attempt at later date/time as appropriate.   Jeni Salles, MPH, MS, OTR/L ascom [phone] 05/25/20, 4:52 PM

## 2020-05-25 NOTE — Anesthesia Preprocedure Evaluation (Deleted)
Anesthesia Evaluation  Patient identified by MRN, date of birth, ID band Patient awake    Reviewed: Allergy & Precautions, H&P , NPO status , Patient's Chart, lab work & pertinent test results  History of Anesthesia Complications Negative for: history of anesthetic complications  Airway        Dental   Pulmonary asthma , neg sleep apnea, COPD, Current Smoker and Patient abstained from smoking.,           Cardiovascular hypertension, (-) angina(-) Past MI and (-) Cardiac Stents (-) dysrhythmias      Neuro/Psych Seizures -,  CVA negative psych ROS   GI/Hepatic negative GI ROS, Neg liver ROS,   Endo/Other  diabetes  Renal/GU negative Renal ROS  negative genitourinary   Musculoskeletal   Abdominal   Peds  Hematology negative hematology ROS (+)   Anesthesia Other Findings Past Medical History: No date: Allergic rhinitis 05/19/2020: Aortic atherosclerosis (HCC) No date: Asthma No date: COPD (chronic obstructive pulmonary disease) (HCC) 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 No date: Diabetes mellitus type II No date: ED (erectile dysfunction) 05/19/2020: Emphysema lung (HCC) No date: Frozen shoulder No date: HLD (hyperlipidemia) No date: HTN (hypertension) No date: Hyperkalemia No date: Kidney stone No date: Other diseases of lung, not elsewhere classified No date: Pulmonary nodule, right     Comment:  lower lobe No date: Rotator cuff injury     Comment:  right No date: Shoulder pain No date: Stroke [facility])     Comment:  pt states "last year" No date: Tobacco abuse  Past Surgical History: 1998: CERVICAL DISCECTOMY No date: EEG 11/1994: ETT 12/03/2011: TEE WITHOUT CARDIOVERSION     Comment:  Procedure: TRANSESOPHAGEAL ECHOCARDIOGRAM (TEE);                Surgeon: Lelon Perla, MD;  Location: [facility] ENDOS[facility];                Service: Cardiovascular;  Laterality: N/A; No date: TM repair 05/23/2020: UPPER EXTREMITY  ANGIOGRAPHY; Left     Comment:  Procedure: Upper Extremity Angiography;  Surgeon: Algernon Huxley, MD;  Location: [facility] CV LAB;  Service:               Cardiovascular;  Laterality: Left;  BMI    Body Mass Index: 26.12 kg/m      Reproductive/Obstetrics negative OB ROS                             Anesthesia Physical Anesthesia Plan  ASA: III  Anesthesia Plan: General   Post-op Pain Management:    Induction:   PONV Risk Score and Plan: Propofol infusion and TIVA  Airway Management Planned:   Additional Equipment:   Intra-op Plan:   Post-operative Plan:   Informed Consent: I have reviewed the patients History and Physical, chart, labs and discussed the procedure including the risks, benefits and alternatives for the proposed anesthesia with the patient or authorized representative who has indicated his/her understanding and acceptance.     Dental Advisory Given  Plan Discussed with: Anesthesiologist, CRNA and Surgeon  Anesthesia Plan Comments:         Anesthesia Quick Evaluation

## 2020-05-25 NOTE — Progress Notes (Signed)
[facility] Daily Progress Note   05/23/20: 1. Ultrasound guidance vascular access to right femoral artery. 2. Catheter placement toleft brachial artery from right femoral approach 3. Thoracic aortogram and selectiveleftupper extremity angiogram 4.Covered stent placement to left subclavian artery with 7 mm diameter by 26 mm length lifestream stent 5. StarClose closure device right femoral artery.  Findings: "Significant stenosis of the left subclavian artery just distal to the vertebral artery by few centimeters. This is an ulcerated 75% lesion. Crossed the lesion without difficulty and advanced to the proximal left brachial artery where selective left upper extremity angiogram was performed showing mild disease in the axillary brachial artery without significant stenosis.  The radial and ulnar arteries were patent without significant disease although flow was sluggish."  Improvement on physical exam today left fourth ring finger. Please continue aspirin, Plavix and statin for medical management. We will continue to follow the patient in our clinic for continued surveillance of his disease.  At this time, vascular surgery will sign off. Please reconsult if there is anything else we can assist with.  Discussed with Dr. Ellis Parents Bronnie Vasseur PA-C 05/25/2020 10:55 AM

## 2020-05-25 NOTE — Progress Notes (Signed)
PROGRESS NOTE    Jonathan [name]  PPJ:[id number] DOB: [birth year] DOA: 05/18/2020 PCP: Abner Greenspan, MD   Brief Narrative:  79 year old male who was recently discharged from the hospital after treatment for COVID-19 viral infection. Presented to ED for evaluation of worsening shortness of breath, nonproductive cough and wheezing and was found to be hypoxic with room air pulse oximetry at rest of 86%. Admitted for COPD exacerbation in the context of recent Covid infection.  COPD exacerbation resolved overnight.  Was found to have MSSA bacteremia which has become the dominant reason for ongoing hospitalization.  Initially refused TEE 8/23, but now agreeable to TEE 8/25.  Source of bacteremia unclear, could be phenomenon related to Covid.  Developed spontaneous left ring finger erythema distally of unclear significance.  Orthopedics following.  Seen by orthopedics, infectious disease and vascular surgery.  Plan for angiogram 8/25 to exclude vascular pathology.   Admit 8/13 to 8/16 for COVID tx w/ remdesivir, steroids. Discharged on steroid taper and compelte outpt remdesivir.   8/13 ED, hypoxic, admitted for COVID  8/12 ED, COVID+, received MAB and was d/c from ED  8/25: Patient seen and examined. Little bit irritated this morning about being n.p.o. Communicated with cardiology this morning. Patient was scheduled for TEE however specials refused to take patient down as he is currently on airborne isolation. Spoke with infection prevention on the floor. Patient's last test was 05/10/2020. He is asymptomatic from a Covid standpoint. No oxygen requirement, no evidence of pneumonia on CT, no fevers. Okay to DC airborne isolation  Communicated with vascular surgery. Plans for angiography of left ring finger today.   8/26: Patient seen and examined.  Irritable this morning.  Apparently his TEE was canceled overnight for reasons that are unclear.  Apparently given concerns for Covid status which was  known by all involved providers.  Notified cardiology this morning.  Case has been rescheduled for 05/25/2020  8/27: Patient seen and examined.  More lethargic this morning.  Less irritable.  TEE once again canceled due to numerous issues including reinstatement of Covid precautions per ID, oxygen requirements of 2 L with baseline COPD, persistent hypokalemia, availability of special procedure staff.  Patient was seen by palliative care.  Recommendations appreciated.  Patient DNR status.  Read my note please do not cancel  Assessment & Plan:   Principal Problem:   Bacteremia due to Staphylococcus aureus Active Problems:   Tobacco abuse   Physical deconditioning   Type 2 diabetes mellitus with hyperglycemia, with long-term current use of insulin (HCC)   COVID-19 virus infection   COPD with acute exacerbation (HCC)   Closed rib fracture   Lung mass   Aortic atherosclerosis (HCC)   Emphysema lung (HCC)   Finger pain, left   COPD exacerbation (HCC)   Frailty   Goals of care, counseling/discussion   Advanced care planning/counseling discussion   Palliative care by specialist  Bacteremia due to Staphylococcus aureus --remains afebrile, vital signs stable. --sepsis ruled out --Source remains unclear.  Remote neck surgery.  No other hardware.  No drug use.  Skin appears unremarkable except for distal left ring finger, which has circumferential erythema. Tender but no abscess or lesions. Xray probably nonacute.  Plan: Continue Ancef Follow surveillance cultures, no growth to date ID following, recommendations appreciated Plan for TEE 05/25/2020 Airborne isolation discontinued TEE currently on hold.  Cannot be performed today due to numerous issues as detailed in Dr. Donivan Scull note.  These include Covid positivity, hypoxia, persistent hypokalemia, availability  of special procedures.  Tentative plan to reschedule for early next week  Finger pain, left --xray nonacute, etiology unclear; may be  Covid phenomenon,  --Appreciate orthopedic evaluation.  Will continue to follow.  Etiology unclear. --Status post angiography with left subclavian stent placement on 05/23/2020  Type 2 diabetes mellitus with hyperglycemia, with long-term current use of insulin (HCC) -Good control over interval. Basal bolus regimen  COPD with acute exacerbation (HCC) with acute hypoxic respiratory failure --resolved. On RA --Continue bronchodilators.  Continue supplemental oxygen.  COVID-19 virus infection --Status post treatment last hospitalization with remdesivir and steroids.  Appears asymptomatic at this time.  Also received monoclonal antibody infusion 8/12. --Asymptomatic. --Positive test 05/10/2020. Communicated with infection prevention. DC airborne isolation precautions Patient is out of 10-day window required for isolation.  Covid isolation was reinstated by infectious disease  Closed rib fracture --Minimally displaced right anterolateral seventh rib fracture, with small surrounding hematoma. No evidence of hemothorax or pneumothorax.  Monitor clinically. 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 PET-CT is recommended for further Evaluation. --f/u as outpt -- I discussed w/ wife and daughter by telephone 8/22  Aortic atherosclerosis ([place]) --continue statin  Emphysema lung ([place]) --as per COPD   DVT prophylaxis: Lovenox Code Status: Full Family Communication: Wife [phone] on 05/23/2020 Disposition Plan: Status is: Inpatient  Remains inpatient appropriate because:Inpatient level of care appropriate due to severity of illness   Dispo: The patient is from: Home              Anticipated d/c is to: Home              Anticipated d/c date is: 3 days              Patient currently is not medically stable to d/c.  Hospital course in question at this time.  We will plan to monitor over the  weekend.  Tentative plan for TEE next week if within the family goals of care.  Palliative care consultation appreciated.  Wish to continue current scope of treatment with out escalation over the weekend and reevaluate early next week.   Consultants:   Vascular surgery  Cardiology  Procedures:   2D echocardiogram  Antimicrobials:   Ancef   Subjective: Patient seen and examined.  Lethargic this morning. Objective: Vitals:   05/25/20 0110 05/25/20 0400 05/25/20 0742 05/25/20 1151  BP: (!) 112/50 (!) 118/56 (!) 142/53 (!) 109/46  Pulse: 74 72 72 79  Resp: (!) 22 18 20 20   Temp: 99.1 F (37.3 C) 98.1 F (36.7 C) 97.8 F (36.6 C) 97.8 F (36.6 C)  TempSrc:  Oral Oral Oral  SpO2: 98% 94% 98% 96%  Weight:      Height:        Intake/Output Summary (Last 24 hours) at 05/25/2020 1337 Last data filed at 05/25/2020 3151 Gross per 24 hour  Intake 1718.23 ml  Output 501 ml  Net 1217.23 ml   Filed Weights   05/18/20 1213 05/23/20 1549  Weight: 71.2 kg 71.2 kg    Examination:  General: Lethargic, no acute distress HEENT: Normocephalic, atraumatic Neck, supple, trachea midline, no tenderness Heart: Regular rate and rhythm, S1/S2 normal, no murmurs Lungs: Clear to auscultation bilaterally, no adventitious sounds, normal work of breathing Abdomen: Soft, nontender, nondistended, positive bowel sounds Extremities: Normal, atraumatic, no clubbing or cyanosis, normal muscle tone Skin: No rashes or lesions, normal color Neurologic: Cranial nerves grossly intact,  sensation intact, alert and oriented x3 Psychiatric: Flattened affect   Data Reviewed: I have personally reviewed following labs and imaging studies  CBC: Recent Labs  Lab 05/19/20 0510 05/20/20 0623 05/21/20 0537 05/22/20 0435 05/24/20 0626  WBC 29.1* 28.5* 28.0* 21.5* 15.6*  HGB 16.5 16.5 17.8* 16.2 15.3  HCT 47.9 47.1 52.4* 47.6 45.2  MCV 85.1 85.3 84.9 85.9 86.9  PLT 319 305 289 275 683   Basic  Metabolic Panel: Recent Labs  Lab 05/19/20 0510 05/20/20 0623 05/23/20 0655 05/24/20 0626 05/25/20 0502  NA 136  --  141 140  --   K 3.1*  --  2.6* 2.6* 2.9*  CL 92*  --  94* 96*  --   CO2 31  --  36* 31  --   GLUCOSE 204*  --  71 117*  --   BUN 23  --  21 21  --   CREATININE 0.91  --  1.00 1.00 0.84  CALCIUM 8.6*  --  7.9* 7.4*  --   MG  --  2.1  --   --  2.2   GFR: Estimated Creatinine Clearance: 62 mL/min (by C-G formula based on SCr of 0.84 mg/dL). Liver Function Tests: No results for input(s): AST, ALT, ALKPHOS, BILITOT, PROT, ALBUMIN in the last 168 hours. No results for input(s): LIPASE, AMYLASE in the last 168 hours. No results for input(s): AMMONIA in the last 168 hours. Coagulation Profile: No results for input(s): INR, PROTIME in the last 168 hours. Cardiac Enzymes: No results for input(s): CKTOTAL, CKMB, CKMBINDEX, TROPONINI in the last 168 hours. BNP (last 3 results) No results for input(s): PROBNP in the last 8760 hours. HbA1C: No results for input(s): HGBA1C in the last 72 hours. CBG: Recent Labs  Lab 05/24/20 1451 05/24/20 1728 05/24/20 2007 05/25/20 0742 05/25/20 1150  GLUCAP 90 141* 119* 188* 161*   Lipid Profile: No results for input(s): CHOL, HDL, LDLCALC, TRIG, CHOLHDL, LDLDIRECT in the last 72 hours. Thyroid Function Tests: No results for input(s): TSH, T4TOTAL, FREET4, T3FREE, THYROIDAB in the last 72 hours. Anemia Panel: No results for input(s): VITAMINB12, FOLATE, FERRITIN, TIBC, IRON, RETICCTPCT in the last 72 hours. Sepsis Labs: Recent Labs  Lab 05/18/20 1644  LATICACIDVEN 2.2*    Recent Results (from the past 240 hour(s))  Blood Culture (routine x 2)     Status: Abnormal   Collection Time: 05/18/20 12:27 PM   Specimen: BLOOD  Result Value Ref Range Status   Specimen Description   Final    BLOOD BLOOD LEFT HAND Performed at [facility], [address], [place], [place] [address]    Special Requests   Final     BOTTLES DRAWN AEROBIC AND ANAEROBIC Blood Culture adequate volume Performed at [facility], [address] [place], [place] [address]    Culture  Setup Time   Final    Organism ID to follow [place] AND ANAEROBIC BOTTLES CRITICAL RESULT CALLED TO, READ BACK BY AND VERIFIED WITH: SCOTT HALL 05/19/20 AT 0010 HS Performed at [facility], [address] [place], [place] [address]    Culture STAPHYLOCOCCUS AUREUS (A)  Final   Report Status 05/21/2020 FINAL  Final   Organism ID, Bacteria STAPHYLOCOCCUS AUREUS  Final      Susceptibility   Staphylococcus aureus - MIC*    CIPROFLOXACIN <=0.5 SENSITIVE Sensitive     ERYTHROMYCIN >=8 RESISTANT Resistant     GENTAMICIN <=0.5 SENSITIVE Sensitive  OXACILLIN 0.5 SENSITIVE Sensitive     TETRACYCLINE <=1 SENSITIVE Sensitive     VANCOMYCIN <=0.5 SENSITIVE Sensitive     TRIMETH/SULFA <=10 SENSITIVE Sensitive     CLINDAMYCIN <=0.25 SENSITIVE Sensitive     RIFAMPIN <=0.5 SENSITIVE Sensitive     Inducible Clindamycin NEGATIVE Sensitive     * STAPHYLOCOCCUS AUREUS  Blood Culture (routine x 2)     Status: Abnormal   Collection Time: 05/18/20 12:27 PM   Specimen: BLOOD  Result Value Ref Range Status   Specimen Description   Final    BLOOD BLOOD LEFT FOREARM Performed at [facility], [address] [place], [place] [address]    Special Requests   Final    BOTTLES DRAWN AEROBIC AND ANAEROBIC Blood Culture adequate volume Performed at [facility], [address] [place], [place] [address]    Culture  Setup Time   Final    GRAM POSITIVE COCCI IN BOTH AEROBIC AND ANAEROBIC BOTTLES CRITICAL VALUE NOTED.  VALUE IS CONSISTENT WITH PREVIOUSLY REPORTED AND CALLED VALUE. Performed at [facility], [address] [place], [place] [address]    Culture (A)  Final    STAPHYLOCOCCUS AUREUS SUSCEPTIBILITIES PERFORMED ON PREVIOUS CULTURE WITHIN THE LAST 5 DAYS. Performed at  [facility], [place] [address] [place],  [address]    Report Status 05/21/2020 FINAL  Final  Blood Culture ID Panel (Reflexed)     Status: Abnormal   Collection Time: 05/18/20 12:27 PM  Result Value Ref Range Status   Enterococcus faecalis NOT DETECTED NOT DETECTED Final   Enterococcus Faecium NOT DETECTED NOT DETECTED Final   Listeria monocytogenes NOT DETECTED NOT DETECTED Final   Staphylococcus species DETECTED (A) NOT DETECTED Final    Comment: CRITICAL RESULT CALLED TO, READ BACK BY AND VERIFIED WITH: SCOTT HALL 05/19/20 AT 0010 HS    Staphylococcus aureus (BCID) DETECTED (A) NOT DETECTED Final    Comment: CRITICAL RESULT CALLED TO, READ BACK BY AND VERIFIED WITH: SCOTT HALL 05/19/20 AT 0010 HS    Staphylococcus epidermidis NOT DETECTED NOT DETECTED Final   Staphylococcus lugdunensis NOT DETECTED NOT DETECTED Final   Streptococcus species NOT DETECTED NOT DETECTED Final   Streptococcus agalactiae NOT DETECTED NOT DETECTED Final   Streptococcus pneumoniae NOT DETECTED NOT DETECTED Final   Streptococcus pyogenes NOT DETECTED NOT DETECTED Final   A.calcoaceticus-baumannii NOT DETECTED NOT DETECTED Final   Bacteroides fragilis NOT DETECTED NOT DETECTED Final   Enterobacterales NOT DETECTED NOT DETECTED Final   Enterobacter cloacae complex NOT DETECTED NOT DETECTED Final   Escherichia coli NOT DETECTED NOT DETECTED Final   Klebsiella aerogenes NOT DETECTED NOT DETECTED Final   Klebsiella oxytoca NOT DETECTED NOT DETECTED Final   Klebsiella pneumoniae NOT DETECTED NOT DETECTED Final   Proteus species NOT DETECTED NOT DETECTED Final   Salmonella species NOT DETECTED NOT DETECTED Final   Serratia marcescens NOT DETECTED NOT DETECTED Final   Haemophilus influenzae NOT DETECTED NOT DETECTED Final   Neisseria meningitidis NOT DETECTED NOT DETECTED Final   Pseudomonas aeruginosa NOT DETECTED NOT DETECTED Final   Stenotrophomonas maltophilia NOT DETECTED NOT DETECTED Final    Candida albicans NOT DETECTED NOT DETECTED Final   Candida auris NOT DETECTED NOT DETECTED Final   Candida glabrata NOT DETECTED NOT DETECTED Final   Candida krusei NOT DETECTED NOT DETECTED Final   Candida parapsilosis NOT DETECTED NOT DETECTED Final   Candida tropicalis NOT DETECTED NOT DETECTED Final   Cryptococcus neoformans/gattii NOT DETECTED  NOT DETECTED Final   Meth resistant mecA/C and MREJ NOT DETECTED NOT DETECTED Final    Comment: Performed at [facility], [address] [place], [place] [address]  Culture, blood (Routine X 2) w Reflex to ID Panel     Status: None   Collection Time: 05/20/20  4:42 PM   Specimen: BLOOD  Result Value Ref Range Status   Specimen Description BLOOD BLOOD LEFT WRIST  Final   Special Requests   Final    BOTTLES DRAWN AEROBIC AND ANAEROBIC Blood Culture adequate volume   Culture   Final    NO GROWTH 5 DAYS Performed at [facility], [address] [place], [place] [address]    Report Status 05/25/2020 FINAL  Final  Culture, blood (Routine X 2) w Reflex to ID Panel     Status: None   Collection Time: 05/20/20  4:46 PM   Specimen: BLOOD  Result Value Ref Range Status   Specimen Description BLOOD LEFT ANTECUBITAL  Final   Special Requests   Final    BOTTLES DRAWN AEROBIC AND ANAEROBIC Blood Culture results may not be optimal due to an excessive volume of blood received in culture bottles   Culture   Final    NO GROWTH 5 DAYS Performed at [facility], [address] [place], [place] [address]    Report Status 05/25/2020 FINAL  Final         Radiology Studies: PERIPHERAL VASCULAR CATHETERIZATION  Result Date: 05/23/2020 See op note  DG Chest Port 1 View  Result Date: 05/24/2020 CLINICAL DATA:  Hypoxia, COVID EXAM: PORTABLE CHEST 1 VIEW COMPARISON:  05/20/2020.  CT 05/18/2020 FINDINGS: There is hyperinflation of the lungs compatible with COPD. Heart is normal size. Interstitial thickening again noted,  unchanged. No effusions or pneumothorax. Pleural based density noted peripherally at the right lung base, shown to be hematoma surrounding and anterior right 7th rib fracture on prior CT. IMPRESSION: COPD.  Stable interstitial prominence without confluent opacity. Electronically Signed   By: Rolm Baptise M.D.   On: 05/24/2020 15:10        Scheduled Meds: . (feeding supplement) PROSource Plus  30 mL Oral BID BM  . aspirin EC  81 mg Oral Daily  . atorvastatin  10 mg Oral Daily  . clopidogrel  75 mg Oral Daily  . enoxaparin (LOVENOX) injection  40 mg Subcutaneous Q24H  . feeding supplement (ENSURE ENLIVE)  237 mL Oral TID BM  . furosemide  40 mg Intravenous Once  . insulin aspart  0-15 Units Subcutaneous TID WC  . insulin detemir  10 Units Subcutaneous QHS  . mometasone-formoterol  2 puff Inhalation BID   And  . umeclidinium bromide  1 puff Inhalation Daily  . multivitamin with minerals  1 tablet Oral Daily  . nicotine  21 mg Transdermal Daily  . polyethylene glycol  17 g Oral BID  . potassium chloride  40 mEq Oral Once  . potassium chloride  60 mEq Oral Once  . ramelteon  8 mg Oral QHS  . senna-docusate  1 tablet Oral QHS   Continuous Infusions: . sodium chloride Stopped (05/21/20 1607)  . famotidine (PEPCID) IV    . nafcillin (NAFCIL) continuous infusion 12 g (05/24/20 2014)     LOS: 7 days    Time spent: 25 minutes    Sidney Ace, MD [facility]  If 7PM-7AM, please contact night-coverage 05/25/2020, 1:37 PM

## 2020-05-25 NOTE — Progress Notes (Signed)
[facility] INFECTIOUS DISE[facility] PROGRESS NOTE Date of Admission:  05/18/2020     ID: RAYNOR CALCATERRA is a 79 y.o. male with MSSA bacteremia, recent covid Principal Problem:   Bacteremia due to Staphylococcus aureus Active Problems:   Tobacco abuse   Physical deconditioning   Type 2 diabetes mellitus with hyperglycemia, with long-term current use of insulin ([place])   COVID-19 virus infection   COPD with acute exacerbation (HCC)   Closed rib fracture   Lung mass   Aortic atherosclerosis (HCC)   Emphysema lung (HCC)   Finger pain, left   COPD exacerbation (HCC)   Frailty   Goals of care, counseling/discussion   Advanced care planning/counseling discussion   Palliative care by specialist   Subjective: No fevers, remains quite weak.   ROS  Unable to obtain   Medications:  Antibiotics Given (last 72 hours)    Date/Time Action Medication Dose Rate   05/22/20 1746 New Bag/Given   nafcillin 12 g in sodium chloride 0.9 % 500 mL continuous infusion 12 g 20.8 mL/hr   05/24/20 2014 New Bag/Given   nafcillin 12 g in sodium chloride 0.9 % 500 mL continuous infusion 12 g 20.8 mL/hr     . (feeding supplement) PROSource Plus  30 mL Oral BID BM  . aspirin EC  81 mg Oral Daily  . atorvastatin  10 mg Oral Daily  . clopidogrel  75 mg Oral Daily  . enoxaparin (LOVENOX) injection  40 mg Subcutaneous Q24H  . feeding supplement (ENSURE ENLIVE)  237 mL Oral TID BM  . furosemide  40 mg Intravenous Once  . insulin aspart  0-15 Units Subcutaneous TID WC  . insulin detemir  10 Units Subcutaneous QHS  . mometasone-formoterol  2 puff Inhalation BID   And  . umeclidinium bromide  1 puff Inhalation Daily  . multivitamin with minerals  1 tablet Oral Daily  . nicotine  21 mg Transdermal Daily  . polyethylene glycol  17 g Oral BID  . ramelteon  8 mg Oral QHS  . senna-docusate  1 tablet Oral QHS    Objective: Vital signs in last 24 hours: Temp:  [97.1 F (36.2 C)-99.1 F (37.3 C)] 97.8 F  (36.6 C) (08/27 1151) Pulse Rate:  [72-89] 79 (08/27 1151) Resp:  [18-48] 20 (08/27 1151) BP: (109-153)/(44-75) 109/46 (08/27 1151) SpO2:  [91 %-98 %] 96 % (08/27 1151) Physical Exam  Constitutional: frail, lying in bed leaning to one side HENT: anicter Mouth/Throat: Oropharynx is clear and moist.  Cardiovascular: Normal rate, regular rhythm and normal heart sounds. Pulmonary/Chest: Effort normal and breath sounds normal. No respiratory distress. He has no wheezes.  Abdominal: Soft. Bowel sounds are normal. He exhibits no distension. There is no tenderness.  Lymphadenopathy:  He has no cervical adenopathy.  Neurological: He is alert and interactive  Skin: Skin is warm and dry. No rash noted. No erythema.  Psychiatric: He has a normal mood and affect. His behavior is normal.       Lab Results Recent Labs    05/23/20 0655 05/23/20 0655 05/24/20 0626 05/25/20 0502  WBC  --   --  15.6*  --   HGB  --   --  15.3  --   HCT  --   --  45.2  --   NA 141  --  140  --   K 2.6*   < > 2.6* 2.9*  CL 94*  --  96*  --   CO2 36*  --  31  --   BUN 21  --  21  --   CREATININE 1.00   < > 1.00 0.84   < > = values in this interval not displayed.    Microbiology: Results for orders placed or performed during the hospital encounter of 05/18/20  Blood Culture (routine x 2)     Status: Abnormal   Collection Time: 05/18/20 12:27 PM   Specimen: BLOOD  Result Value Ref Range Status   Specimen Description   Final    BLOOD BLOOD LEFT HAND Performed at [facility], [address], [place], [place] [address]    Special Requests   Final    BOTTLES DRAWN AEROBIC AND ANAEROBIC Blood Culture adequate volume Performed at [facility], [address] [place], [place] [address]    Culture  Setup Time   Final    Organism ID to follow [place] AND ANAEROBIC BOTTLES CRITICAL RESULT CALLED TO, READ BACK BY AND VERIFIED WITH: SCOTT HALL 05/19/20 AT 0010  HS Performed at [facility], [address] [place], [place] [address]    Culture STAPHYLOCOCCUS AUREUS (A)  Final   Report Status 05/21/2020 FINAL  Final   Organism ID, Bacteria STAPHYLOCOCCUS AUREUS  Final      Susceptibility   Staphylococcus aureus - MIC*    CIPROFLOXACIN <=0.5 SENSITIVE Sensitive     ERYTHROMYCIN >=8 RESISTANT Resistant     GENTAMICIN <=0.5 SENSITIVE Sensitive     OXACILLIN 0.5 SENSITIVE Sensitive     TETRACYCLINE <=1 SENSITIVE Sensitive     VANCOMYCIN <=0.5 SENSITIVE Sensitive     TRIMETH/SULFA <=10 SENSITIVE Sensitive     CLINDAMYCIN <=0.25 SENSITIVE Sensitive     RIFAMPIN <=0.5 SENSITIVE Sensitive     Inducible Clindamycin NEGATIVE Sensitive     * STAPHYLOCOCCUS AUREUS  Blood Culture (routine x 2)     Status: Abnormal   Collection Time: 05/18/20 12:27 PM   Specimen: BLOOD  Result Value Ref Range Status   Specimen Description   Final    BLOOD BLOOD LEFT FOREARM Performed at [facility], [address] [place], [place] [address]    Special Requests   Final    BOTTLES DRAWN AEROBIC AND ANAEROBIC Blood Culture adequate volume Performed at [facility], [address] [place], [place] [address]    Culture  Setup Time   Final    GRAM POSITIVE COCCI IN BOTH AEROBIC AND ANAEROBIC BOTTLES CRITICAL VALUE NOTED.  VALUE IS CONSISTENT WITH PREVIOUSLY REPORTED AND CALLED VALUE. Performed at [facility], [address] [place], [place] [address]    Culture (A)  Final    STAPHYLOCOCCUS AUREUS SUSCEPTIBILITIES PERFORMED ON PREVIOUS CULTURE WITHIN THE LAST 5 DAYS. Performed at [facility], [place] [address], [place],  [address]    Report Status 05/21/2020 FINAL  Final  Blood Culture ID Panel (Reflexed)     Status: Abnormal   Collection Time: 05/18/20 12:27 PM  Result Value Ref Range Status   Enterococcus faecalis NOT DETECTED NOT DETECTED Final   Enterococcus Faecium NOT DETECTED NOT DETECTED Final    Listeria monocytogenes NOT DETECTED NOT DETECTED Final   Staphylococcus species DETECTED (A) NOT DETECTED Final    Comment: CRITICAL RESULT CALLED TO, READ BACK BY AND VERIFIED WITH: SCOTT HALL 05/19/20 AT 0010 HS    Staphylococcus aureus (BCID) DETECTED (A) NOT DETECTED Final    Comment: CRITICAL RESULT CALLED TO, READ BACK BY AND VERIFIED  WITH: SCOTT HALL 05/19/20 AT 0010 HS    Staphylococcus epidermidis NOT DETECTED NOT DETECTED Final   Staphylococcus lugdunensis NOT DETECTED NOT DETECTED Final   Streptococcus species NOT DETECTED NOT DETECTED Final   Streptococcus agalactiae NOT DETECTED NOT DETECTED Final   Streptococcus pneumoniae NOT DETECTED NOT DETECTED Final   Streptococcus pyogenes NOT DETECTED NOT DETECTED Final   A.calcoaceticus-baumannii NOT DETECTED NOT DETECTED Final   Bacteroides fragilis NOT DETECTED NOT DETECTED Final   Enterobacterales NOT DETECTED NOT DETECTED Final   Enterobacter cloacae complex NOT DETECTED NOT DETECTED Final   Escherichia coli NOT DETECTED NOT DETECTED Final   Klebsiella aerogenes NOT DETECTED NOT DETECTED Final   Klebsiella oxytoca NOT DETECTED NOT DETECTED Final   Klebsiella pneumoniae NOT DETECTED NOT DETECTED Final   Proteus species NOT DETECTED NOT DETECTED Final   Salmonella species NOT DETECTED NOT DETECTED Final   Serratia marcescens NOT DETECTED NOT DETECTED Final   Haemophilus influenzae NOT DETECTED NOT DETECTED Final   Neisseria meningitidis NOT DETECTED NOT DETECTED Final   Pseudomonas aeruginosa NOT DETECTED NOT DETECTED Final   Stenotrophomonas maltophilia NOT DETECTED NOT DETECTED Final   Candida albicans NOT DETECTED NOT DETECTED Final   Candida auris NOT DETECTED NOT DETECTED Final   Candida glabrata NOT DETECTED NOT DETECTED Final   Candida krusei NOT DETECTED NOT DETECTED Final   Candida parapsilosis NOT DETECTED NOT DETECTED Final   Candida tropicalis NOT DETECTED NOT DETECTED Final   Cryptococcus neoformans/gattii NOT  DETECTED NOT DETECTED Final   Meth resistant mecA/C and MREJ NOT DETECTED NOT DETECTED Final    Comment: Performed at [facility], [address] [place], [place] [address]  Culture, blood (Routine X 2) w Reflex to ID Panel     Status: None   Collection Time: 05/20/20  4:42 PM   Specimen: BLOOD  Result Value Ref Range Status   Specimen Description BLOOD BLOOD LEFT WRIST  Final   Special Requests   Final    BOTTLES DRAWN AEROBIC AND ANAEROBIC Blood Culture adequate volume   Culture   Final    NO GROWTH 5 DAYS Performed at [facility], [address] [place], [place] [address]    Report Status 05/25/2020 FINAL  Final  Culture, blood (Routine X 2) w Reflex to ID Panel     Status: None   Collection Time: 05/20/20  4:46 PM   Specimen: BLOOD  Result Value Ref Range Status   Specimen Description BLOOD LEFT ANTECUBITAL  Final   Special Requests   Final    BOTTLES DRAWN AEROBIC AND ANAEROBIC Blood Culture results may not be optimal due to an excessive volume of blood received in culture bottles   Culture   Final    NO GROWTH 5 DAYS Performed at [facility], [address] [place], [place] [address]    Report Status 05/25/2020 FINAL  Final    Studies/Results: PERIPHERAL VASCULAR CATHETERIZATION  Result Date: 05/23/2020 See op note  DG Chest Port 1 View  Result Date: 05/24/2020 CLINICAL DATA:  Hypoxia, COVID EXAM: PORTABLE CHEST 1 VIEW COMPARISON:  05/20/2020.  CT 05/18/2020 FINDINGS: There is hyperinflation of the lungs compatible with COPD. Heart is normal size. Interstitial thickening again noted, unchanged. No effusions or pneumothorax. Pleural based density noted peripherally at the right lung base, shown to be hematoma surrounding and anterior right 7th rib fracture on prior CT. IMPRESSION: COPD.  Stable interstitial prominence without confluent opacity. Electronically Signed   By: Lennette Bihari  [place] M.D.   On: 05/24/2020 15:10    Assessment/Plan: MSSA  bacteremia- unclear source- has been in and out of  hospital since 8/12 On Nafcilin and leucocytosis improving Need TEE but considering comfort care. Cards and palliative care following   L shoulder seems much less painful. Able to move without pain   Multiple ischemic digits left hand- angiogram done on 05/23/20 revealed significant stenosis of the left subclavian artery just distal to the vertebral artery by few centimeters.  an ulcerated 75% lesion seen and it was stented by Dr.Dew- on heparin.   Covid illness- because of ongoing resp distress and need for oxygen with underlying COPD will not remove airborne now-  recommend repeating test next week to check ct value  Thank you very much for the consult. Will follow with you.  Leonel Ramsay   05/25/2020, 2:44 PM

## 2020-05-25 NOTE — Progress Notes (Signed)
PT Cancellation Note  Patient Details Name: Jonathan [name] MRN: [id number] DOB: 09-12-1941   Cancelled Treatment:     PT hold. Pt not appropriate to participate. Low K+. Will continue to follow per poc  And return when pt is more appropriate to participate.    Willette Pa 05/25/2020, 10:20 AM

## 2020-05-25 NOTE — Progress Notes (Signed)
Nutrition Follow-up  DOCUMENTATION CODES:   Not applicable  INTERVENTION:  Ensure Enlive po TID, each supplement provides 350 kcal and 20 grams of protein  ProSource Plus 30 ml po BID, each supplement provides 100 kcal and 15 grams of protein  MVI with minerals daily  Recommend monitorin magnesium, potassium, and phosphorus daily  MD to replete as needed, as pt is at risk for refeeding syndrome given poor po intake during admission, labs significant for hypokalemia.  NUTRITION DIAGNOSIS:   Increased nutrient needs related to catabolic illness (AECOPD, COVID-19) as evidenced by estimated needs. -ongoing  GOAL:   Patient will meet greater than or equal to 90% of their needs -progressing  MONITOR:   PO intake, Diet advancement, Labs, Weight trends, I & O's  REASON FOR ASSESSMENT:   Consult Assessment of nutrition requirement/status  ASSESSMENT:  RD working remotely.  79 year old male with PMHx of HTN, HLD, COPD, DM, asthma who was recently discharged after treatment for COVID-19 now admitted with bacteremia due to staphylococcus aureus, acute exacerbation of COPD, also with 1.4cm spiculated right lower lobe mass concerning for malignancy.  RD unable to reach pt via phone today. Per notes, MSSA bacteremia of unclear source, leukocytosis improving. 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 Ongoing respiratory distress with need for supplemental O2, pt placed back on airborne/contact precautions. Noted markedly depressed K and hypoxia, scheduled TEE cancelled today, plans to reevaluate early next week, diet advanced. Patient with very poor po intake, per flowsheets he consumed 0-10% of meals yesterday, significant hypokalemia per labs. Patient is at risk for malnutrition, and would greatly benefit from nutrient dense supplement. One Ensure Enlive supplement provides 350 kcals, 20 grams protein, and  44-45 grams of carbohydrate vs one Glucerna shake supplement, which provides 220 kcals, 10 grams of protein, and 26 grams of carbohydrate. Given pt's hx of DM, RD will reassess adequacy of PO intake, CBGS, and adjust supplement regimen as appropriate at follow-up.   Medications reviewed and include: Lasix 40 mg IV once, SSI, Levemir 10 units daily, Miralax, Senokot, Klor-con, Nafcillin  Labs: CBGs 161,188,119,141, K 2.6 (L), WBC 15.6 (H), Mg 2.2 (L)   Diet Order:   Diet Order            Diet heart healthy/carb modified Room service appropriate? Yes; Fluid consistency: Thin  Diet effective now                 EDUCATION NEEDS:   No education needs have been identified at this time  Skin:  Skin Assessment: Reviewed RN Assessment  Last BM:  8/23  Height:   Ht Readings from Last 1 Encounters:  05/23/20 5\' 5"  (1.651 m)    Weight:   Wt Readings from Last 1 Encounters:  05/23/20 71.2 kg    BMI:  Body mass index is 26.12 kg/m.  Estimated Nutritional Needs:   Kcal:  1900-2100  Protein:  95-105 grams  Fluid:  1.8 L/day   Lajuan Lines, RD, LDN Clinical Nutrition After Hours/Weekend Pager # in [phone]

## 2020-05-25 NOTE — Progress Notes (Signed)
Discussed case with anesthesia, specials TEE canceled at this time Multitude of issues have arisen including markedly depressed potassium, respiratory issues/hypoxia,  on Covid restrictions, short staffing in specials and inability to handle a Covid patient or send extra staff to the bedside for in room procedure. As the patient will remain inpatient through the weekend into next week, will reevaluate early next week for consideration of rescheduling. TGollan

## 2020-05-25 NOTE — Progress Notes (Addendum)
Per anesthesia and cardiology, TEE cancelled for today due to patient's current status. Message relayed to bedside RN Gerald Stabs.

## 2020-05-25 NOTE — Consult Note (Signed)
Consultation Note Date: 05/25/2020   Patient Name: Jonathan [name]  DOB: 1941-01-25  MRN: [id number]  Age / Sex: 79 y.o., male  PCP: Tower, Wynelle Fanny, MD Referring Physician: Sidney Ace, MD  Reason for Consultation: Establishing goals of care  HPI/Patient Profile: 79 y.o. male  with past medical history of recent COVID-19 infection (8/12), COPD, emphysema, HTN, DM, rib fracture from fall at home- admitted on 05/18/2020 with workup revealing MSSA bacteremia, COPD exacerbation, COVID related respiratory decline. He was scheduled for TEE- however this is on hold due to other significant issues and COVID precluding advantage of test. Palliative medicine consulted for goals of care.    Clinical Assessment and Goals of Care: I evaluated Mr. Leamon at the bedside utilizing full PPE in accordance with COVID-19 restrictions.  He was lying in bed, calm, lethargic. Spoke with his eyes closed. He was unable to tell me why he was in the hospital. He expressed that his daughter- Jonathan [name] would be his decision maker if he was unable. On my assessment I did not believe patient was able to fully understand his situation and participate in goals of care discussion. I called and spoke with Jonathan [name] and with Jonathan [name] for further [place] discussion.  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 He is known to be independent and strong. He values spending time with his family and being able to care for himself.  His current illness and possible trajectory was discussed.  Prior to admission he had been declining for several weeks-months. He was unable to walk to the bathroom, he has been losing weight and eating less, his confusion has been increasing. He sleeps more than he is awake.  Concerns related to prognosis- I shared worries that while patient may survive this hospitalization with continued aggressive care- my  worry is that he will not be restored to his previous or better state of function. He would likely need to go to a nursing facility. Family shared that patient had called them last night and told them he was dying.  We discussed that patient's time may be affected by his current illness- encouraged family to consider patient's values and goals and how he would wish to spend that time.  Continued aggressive medical care vs transition to comfort measures only was discussed.  Code status was discussed- in his current state, undergoing CPR would harm him more than help him- DNR status recommended and family agreed.     Primary Decision Maker NEXT OF KIN- daughter and spouse    SUMMARY OF RECOMMENDATIONS -DNR -Family wishes to continue current scope of care through weekend and touch base again on Monday to see if there has been improvement -If no improvement or worsening- and family desires comfort measures- patient would likely need to remain in the hospital until end of life for symptom management as the only hospice facility currently taking patients is in [place] -Family requested that patient be assisted with all meals and has something to drink close  by- will discuss with nursing and place nursing care order     Code Status/Advance Care Planning:  DNR  Prognosis:    Unable to determine  Discharge Planning: To Be Determined  Primary Diagnoses: Present on Admission: . COPD with acute exacerbation ([place]) . (Resolved) Diabetes type 2, uncontrolled ([place]) . Tobacco abuse . Lung mass   I have reviewed the medical record, interviewed the patient and family, and examined the patient. The following aspects are pertinent.  Past Medical History:  Diagnosis Date  . Allergic rhinitis   . Aortic atherosclerosis ([place]) 05/19/2020  . Asthma   . COPD (chronic obstructive pulmonary disease) ([place])   . Dermatitis seborrheica   . Diabetes mellitus type II   . ED (erectile dysfunction)   .  Emphysema lung ([place]) 05/19/2020  . Frozen shoulder   . HLD (hyperlipidemia)   . HTN (hypertension)   . Hyperkalemia   . Kidney stone   . Other diseases of lung, not elsewhere classified   . Pulmonary nodule, right    lower lobe  . Rotator cuff injury    right  . Shoulder pain   . Stroke [facility])    pt states "last year"  . Tobacco abuse    Social History   Socioeconomic History  . Marital status: Married    Spouse name: Not on file  . Number of children: Not on file  . Years of education: Not on file  . Highest education level: Not on file  Occupational History  . Occupation: Retired    Comment: Air cabin crew  Tobacco Use  . Smoking status: Current Every Day Smoker    Packs/day: 1.00    Years: 65.00    Pack years: 65.00    Types: Cigarettes  . Smokeless tobacco: Never Used  Vaping Use  . Vaping Use: Never used  Substance and Sexual Activity  . Alcohol use: No    Alcohol/week: 0.0 standard drinks    Comment: Quit 40 years ago  . Drug use: No  . Sexual activity: Never    Partners: Female  Other Topics Concern  . Not on file  Social History Narrative  . Not on file   Social Determinants of Health   Financial Resource Strain:   . Difficulty of Paying Living Expenses: Not on file  Food Insecurity:   . Worried About Charity fundraiser in the Last Year: Not on file  . Ran Out of Food in the Last Year: Not on file  Transportation Needs:   . Lack of Transportation (Medical): Not on file  . Lack of Transportation (Non-Medical): Not on file  Physical Activity:   . Days of Exercise per Week: Not on file  . Minutes of Exercise per Session: Not on file  Stress:   . Feeling of Stress : Not on file  Social Connections:   . Frequency of Communication with Friends and Family: Not on file  . Frequency of Social Gatherings with Friends and Family: Not on file  . Attends Religious Services: Not on file  . Active Member of Clubs or Organizations: Not on file  . Attends  Archivist Meetings: Not on file  . Marital Status: Not on file   Family History  Problem Relation Age of Onset  . Asthma Sister   . Emphysema Sister   . Diabetes Mother   . Diabetes Brother   . Heart disease Brother    Scheduled Meds: . aspirin EC  81 mg Oral Daily  .  atorvastatin  10 mg Oral Daily  . clopidogrel  75 mg Oral Daily  . enoxaparin (LOVENOX) injection  40 mg Subcutaneous Q24H  . furosemide  40 mg Intravenous Once  . insulin aspart  0-15 Units Subcutaneous TID WC  . insulin detemir  10 Units Subcutaneous QHS  . mometasone-formoterol  2 puff Inhalation BID   And  . umeclidinium bromide  1 puff Inhalation Daily  . nicotine  21 mg Transdermal Daily  . polyethylene glycol  17 g Oral BID  . potassium chloride  40 mEq Oral Once  . ramelteon  8 mg Oral QHS  . senna-docusate  1 tablet Oral QHS   Continuous Infusions: . sodium chloride Stopped (05/21/20 1607)  . famotidine (PEPCID) IV    . nafcillin (NAFCIL) continuous infusion 12 g (05/24/20 2014)   PRN Meds:.sodium chloride, albuterol, albuterol, ALPRAZolam, diphenhydrAMINE, EPINEPHrine, famotidine (PEPCID) IV, ipratropium-albuterol, ondansetron (ZOFRAN) IV, oxyCODONE Medications Prior to Admission:  Prior to Admission medications   Medication Sig Start Date End Date Taking? Authorizing Provider  albuterol (PROVENTIL) (2.5 MG/3ML) 0.083% nebulizer solution Take 2.5 mg by nebulization every 4 (four) hours as needed for wheezing or shortness of breath.   Yes [provider]  albuterol (VENTOLIN HFA) 108 (90 Base) MCG/ACT inhaler Inhale 2 puffs into the lungs every 6 (six) hours as needed for wheezing. 04/09/20  Yes Tower, Wynelle Fanny, MD  aspirin 81 MG tablet Take 1 tablet (81 mg total) by mouth daily. 11/26/13  Yes Ghimire, Henreitta Leber, MD  atorvastatin (LIPITOR) 10 MG tablet Take 1 tablet (10 mg total) by mouth daily. 04/09/20  Yes Tower, Wynelle Fanny, MD  Budeson-Glycopyrrol-Formoterol (BREZTRI AEROSPHERE)  160-9-4.8 MCG/ACT AERO Inhale 2 puffs into the lungs 2 (two) times daily.   Yes [provider]  HUMALOG KWIKPEN 100 UNIT/ML KwikPen INJECT 8-14 UNITS INTO THE SKIN 3 TIMES DAILY. Patient taking differently: Inject 5-16 Units into the skin See admin instructions. Inject 3 times a day with meals and as needed with evening snack: - 10 units with a smaller meal - 12-14 units with a regular meal - 16 units with a larger meal - 5-6 units with evening snack 09/06/19  Yes Philemon Kingdom, MD  insulin glargine (LANTUS) 100 UNIT/ML Solostar Pen Inject 42 Units into the skin at bedtime. 04/10/20  Yes Philemon Kingdom, MD  metFORMIN (GLUCOPHAGE) 1000 MG tablet TAKE ONE TABLET BY MOUTH  TWICE DAILY Patient taking differently: Take 1,000 mg by mouth 2 (two) times daily with a meal.  01/17/20  Yes Philemon Kingdom, MD  predniSONE (DELTASONE) 10 MG tablet Take 4 tablets (40 mg total) by mouth daily for 3 days, THEN 3 tablets (30 mg total) daily for 3 days, THEN 2 tablets (20 mg total) daily for 3 days, THEN 1 tablet (10 mg total) daily for 3 days. 05/14/20 05/26/20 Yes Little Ishikawa, MD  fluticasone furoate-vilanterol (BREO ELLIPTA) 100-25 MCG/INH AEPB Inhale 1 puff into the lungs daily. Patient not taking: Reported on 05/23/2020    [provider]   Allergies  Allergen Reactions  . Amoxicillin-Pot Clavulanate Other (See Comments)    Severe stomach pain.   . Quinapril Hcl Other (See Comments)    back pain  . Valsartan Other (See Comments)    back pain  . Varenicline Tartrate Other (See Comments)    AMS, hallucinations, night mares   Review of Systems  Unable to perform ROS: Acuity of condition    Physical Exam Vitals and nursing note reviewed.  Constitutional:      Appearance: He is ill-appearing.  Pulmonary:     Comments: Increased effort Neurological:     Comments: Oriented to person, not place or situation     Vital Signs: BP (!) 109/46 (BP Location: Right Arm)    Pulse 79   Temp 97.8 F (36.6 C) (Oral)   Resp 20   Ht 5\' 5"  (1.651 m)   Wt 71.2 kg   SpO2 96%   BMI 26.12 kg/m  Pain Scale: 0-10 POSS *See Group Information*: S-Acceptable,Sleep, easy to arouse Pain Score: 0-No pain   SpO2: SpO2: 96 % O2 Device:SpO2: 96 % O2 Flow Rate: .O2 Flow Rate (L/min): 2 L/min  IO: Intake/output summary:   Intake/Output Summary (Last 24 hours) at 05/25/2020 1245 Last data filed at 05/25/2020 0513 Gross per 24 hour  Intake 1718.23 ml  Output 501 ml  Net 1217.23 ml    LBM: Last BM Date: 05/21/20 Baseline Weight: Weight: 71.2 kg Most recent weight: Weight: 71.2 kg     Palliative Assessment/Data: PPS: 20%     Thank you for this consult. Palliative medicine will continue to follow and assist as needed.   Time In: 1246 Time Out: 1122 Time Total: 84 minutes Greater than 50%  of this time was spent counseling and coordinating care related to the above assessment and plan.  Signed by: Mariana Kaufman, AGNP-C Palliative Medicine    Please contact Palliative Medicine Team phone at [phone] for questions and concerns.  For individual provider: See Shea Evans

## 2020-05-26 LAB — MAGNESIUM: Magnesium: 2 mg/dL (ref 1.7–2.4)

## 2020-05-26 LAB — BASIC METABOLIC PANEL
Anion gap: 12 (ref 5–15)
BUN: 15 mg/dL (ref 8–23)
CO2: 31 mmol/L (ref 22–32)
Calcium: 7.4 mg/dL — ABNORMAL LOW (ref 8.9–10.3)
Chloride: 91 mmol/L — ABNORMAL LOW (ref 98–111)
Creatinine, Ser: 1.01 mg/dL (ref 0.61–1.24)
GFR calc Af Amer: >60 mL/min (ref >60)
GFR calc non Af Amer: >60 mL/min (ref >60)
Glucose, Bld: 246 mg/dL — ABNORMAL HIGH (ref 70–99)
Potassium: 3 mmol/L — ABNORMAL LOW (ref 3.5–5.1)
Sodium: 134 mmol/L — ABNORMAL LOW (ref 135–145)

## 2020-05-26 LAB — GLUCOSE, CAPILLARY
Glucose-Capillary: 189 mg/dL — ABNORMAL HIGH (ref 70–99)
Glucose-Capillary: 197 mg/dL — ABNORMAL HIGH (ref 70–99)
Glucose-Capillary: 161 mg/dL — ABNORMAL HIGH (ref 70–99)
Glucose-Capillary: 141 mg/dL — ABNORMAL HIGH (ref 70–99)

## 2020-05-26 MED ORDER — POTASSIUM CHLORIDE CRYS ER 20 MEQ PO TBCR: 60.0000 meq | EXTENDED_RELEASE_TABLET | Freq: Once | ORAL | Status: DC

## 2020-05-26 MED ORDER — POTASSIUM CHLORIDE 20 MEQ PO PACK: 80.0000 meq | PACK | Freq: Two times a day (BID) | ORAL | Status: DC

## 2020-05-26 MED ORDER — ALPRAZOLAM 0.25 MG PO TABS
0.2500 mg | ORAL_TABLET | Freq: Two times a day (BID) | ORAL | Status: DC | PRN
  Administered 2020-05-27: 07:00:00 0.25 mg via ORAL
  Filled 2020-05-26: qty 1

## 2020-05-26 MED ORDER — POTASSIUM CHLORIDE 10 MEQ/100ML IV SOLN: 10.0000 meq | INTRAVENOUS | Status: DC

## 2020-05-26 MED ORDER — POTASSIUM CHLORIDE 20 MEQ/15ML (10%) PO SOLN
40.0000 meq | ORAL | Status: AC
  Administered 2020-05-26 (×2): 40 meq via ORAL
  Filled 2020-05-26 (×2): qty 30

## 2020-05-26 NOTE — Progress Notes (Signed)
PROGRESS NOTE    Jonathan [name]  GEZ:[id number] DOB: July 01, 1941 DOA: 05/18/2020 PCP: Abner Greenspan, MD   Brief Narrative:  79 year old male who was recently discharged from the hospital after treatment for COVID-19 viral infection. Presented to ED for evaluation of worsening shortness of breath, nonproductive cough and wheezing and was found to be hypoxic with room air pulse oximetry at rest of 86%. Admitted for COPD exacerbation in the context of recent Covid infection.  COPD exacerbation resolved overnight.  Was found to have MSSA bacteremia which has become the dominant reason for ongoing hospitalization.  Initially refused TEE 8/23, but now agreeable to TEE 8/25.  Source of bacteremia unclear, could be phenomenon related to Covid.  Developed spontaneous left ring finger erythema distally of unclear significance.  Orthopedics following.  Seen by orthopedics, infectious disease and vascular surgery.  Plan for angiogram 8/25 to exclude vascular pathology.   Admit 8/13 to 8/16 for COVID tx w/ remdesivir, steroids. Discharged on steroid taper and compelte outpt remdesivir.   8/13 ED, hypoxic, admitted for COVID  8/12 ED, COVID+, received MAB and was d/c from ED  8/25: Patient seen and examined. Little bit irritated this morning about being n.p.o. Communicated with cardiology this morning. Patient was scheduled for TEE however specials refused to take patient down as he is currently on airborne isolation. Spoke with infection prevention on the floor. Patient's last test was 05/10/2020. He is asymptomatic from a Covid standpoint. No oxygen requirement, no evidence of pneumonia on CT, no fevers. Okay to DC airborne isolation  Communicated with vascular surgery. Plans for angiography of left ring finger today.   8/26: Patient seen and examined.  Irritable this morning.  Apparently his TEE was canceled overnight for reasons that are unclear.  Apparently given concerns for Covid status which was  known by all involved providers.  Notified cardiology this morning.  Case has been rescheduled for 05/25/2020  8/27: Patient seen and examined.  More lethargic this morning.  Less irritable.  TEE once again canceled due to numerous issues including reinstatement of Covid precautions per ID, oxygen requirements of 2 L with baseline COPD, persistent hypokalemia, availability of special procedure staff.  Patient was seen by palliative care.  Recommendations appreciated.  Patient DNR status.    8/28: Patient seen and examined.  We can sleepy this morning.  TEE on hold through the weekend.  Seen by palliative care yesterday.  Reach out to family.  Considering moving towards comfort palliative route should patient's functional status not improve.  Made on 2 L.  Does not appear in any distress.  Assessment & Plan:   Principal Problem:   Bacteremia due to Staphylococcus aureus Active Problems:   Tobacco abuse   Physical deconditioning   Type 2 diabetes mellitus with hyperglycemia, with long-term current use of insulin (HCC)   COVID-19 virus infection   COPD with acute exacerbation (HCC)   Closed rib fracture   Lung mass   Aortic atherosclerosis (HCC)   Emphysema lung (HCC)   Finger pain, left   COPD exacerbation (HCC)   Frailty   Goals of care, counseling/discussion   Advanced care planning/counseling discussion   Palliative care by specialist  Bacteremia due to Staphylococcus aureus --remains afebrile, vital signs stable. --sepsis ruled out --Source remains unclear.  Remote neck surgery.  No other hardware.  No drug use.  Skin appears unremarkable except for distal left ring finger, which has circumferential erythema. Tender but no abscess or lesions. Xray probably nonacute.  Plan: Continue Ancef Follow surveillance cultures, no growth to date ID following, recommendations appreciated Tentative plan for TEE however palliative care has evaluated patient reach out to family.  May elect for  more comfort based approach if functional status does not improve by Monday  Finger pain, left --xray nonacute, etiology unclear; may be Covid phenomenon,  --Appreciate orthopedic evaluation.  Will continue to follow.  Etiology unclear. --Status post angiography with left subclavian stent placement on 05/23/2020  Type 2 diabetes mellitus with hyperglycemia, with long-term current use of insulin (HCC) -Good control over interval. Basal bolus regimen  COPD with acute exacerbation (HCC) with acute hypoxic respiratory failure --resolved. On RA --Continue bronchodilators.  Continue supplemental oxygen.  COVID-19 virus infection --Status post treatment last hospitalization with remdesivir and steroids.  Appears asymptomatic at this time.  Also received monoclonal antibody infusion 8/12. --Asymptomatic. --Positive test 05/10/2020. Communicated with infection prevention. DC airborne isolation precautions Patient is out of 10-day window required for isolation.  Covid isolation was reinstated by infectious disease  Closed rib fracture --Minimally displaced right anterolateral seventh rib fracture, with small surrounding hematoma. No evidence of hemothorax or pneumothorax.  Monitor clinically. 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 PET-CT is recommended for further Evaluation. --f/u as outpt -- I discussed w/ wife and daughter by telephone 8/22  Aortic atherosclerosis ([place]) --continue statin  Emphysema lung ([place]) --as per COPD   DVT prophylaxis: Lovenox Code Status: Full Family Communication: Wife and daughter [phone] on 05/26/2020 Disposition Plan: Status is: Inpatient  Remains inpatient appropriate because:Inpatient level of care appropriate due to severity of illness   Dispo: The patient is from: Home              Anticipated d/c is to: Home              Anticipated d/c date is: 3  days              Patient currently is not medically stable to d/c.  Remainder of hospital course unclear at this point.  There is a tentative plan for TEE early next week however patient's functional status needs to improve in order to tolerate.  Seen By palliative care.  Move toward more of a comfort based approach functional status does not improve by Monday.   Consultants:   Vascular surgery  Cardiology  Procedures:   2D echocardiogram  Antimicrobials:   Ancef   Subjective: Patient seen and examined.  Lethargic this morning.  Objective: Vitals:   05/25/20 2109 05/26/20 0548 05/26/20 0833 05/26/20 1000  BP: (!) 170/68 (!) 133/57 129/74   Pulse: 88 82 89 75  Resp: 18 16 (!) 22   Temp: 98 F (36.7 C) 98.5 F (36.9 C) 97.7 F (36.5 C)   TempSrc: Oral Oral Oral   SpO2: 97% 97% 96% 96%  Weight:      Height:        Intake/Output Summary (Last 24 hours) at 05/26/2020 1226 Last data filed at 05/26/2020 8295 Gross per 24 hour  Intake 401.44 ml  Output 700 ml  Net -298.56 ml   Filed Weights   05/18/20 1213 05/23/20 1549  Weight: 71.2 kg 71.2 kg    Examination:  General: Lethargic.  Appears ill HEENT: Normocephalic, atraumatic Neck, supple, trachea midline, no tenderness Heart: Regular rate and rhythm, S1/S2 normal, no murmurs Lungs: Poor inspiratory effort.  2 L nasal cannula.  Lung sounds diffusely decreased  Abdomen: Soft, nontender, nondistended, positive bowel sounds Extremities: Normal, atraumatic, no clubbing or cyanosis, normal muscle tone Skin: No rashes or lesions, normal color Neurologic: Cranial nerves grossly intact, sensation intact, alert and oriented x3 Psychiatric: Flattened affect    Data Reviewed: I have personally reviewed following labs and imaging studies  CBC: Recent Labs  Lab 05/20/20 0623 05/21/20 0537 05/22/20 0435 05/24/20 0626  WBC 28.5* 28.0* 21.5* 15.6*  HGB 16.5 17.8* 16.2 15.3  HCT 47.1 52.4* 47.6 45.2  MCV 85.3 84.9  85.9 86.9  PLT 305 289 275 865   Basic Metabolic Panel: Recent Labs  Lab 05/20/20 0623 05/23/20 0655 05/24/20 0626 05/25/20 0502 05/26/20 0343  NA  --  141 140  --  134*  K  --  2.6* 2.6* 2.9* 3.0*  CL  --  94* 96*  --  91*  CO2  --  36* 31  --  31  GLUCOSE  --  71 117*  --  246*  BUN  --  21 21  --  15  CREATININE  --  1.00 1.00 0.84 1.01  CALCIUM  --  7.9* 7.4*  --  7.4*  MG 2.1  --   --  2.2 2.0   GFR: Estimated Creatinine Clearance: 51.6 mL/min (by C-G formula based on SCr of 1.01 mg/dL). Liver Function Tests: No results for input(s): AST, ALT, ALKPHOS, BILITOT, PROT, ALBUMIN in the last 168 hours. No results for input(s): LIPASE, AMYLASE in the last 168 hours. No results for input(s): AMMONIA in the last 168 hours. Coagulation Profile: No results for input(s): INR, PROTIME in the last 168 hours. Cardiac Enzymes: No results for input(s): CKTOTAL, CKMB, CKMBINDEX, TROPONINI in the last 168 hours. BNP (last 3 results) No results for input(s): PROBNP in the last 8760 hours. HbA1C: No results for input(s): HGBA1C in the last 72 hours. CBG: Recent Labs  Lab 05/25/20 1150 05/25/20 1714 05/25/20 2109 05/26/20 0834 05/26/20 1225  GLUCAP 161* 202* 79 189* 197*   Lipid Profile: No results for input(s): CHOL, HDL, LDLCALC, TRIG, CHOLHDL, LDLDIRECT in the last 72 hours. Thyroid Function Tests: No results for input(s): TSH, T4TOTAL, FREET4, T3FREE, THYROIDAB in the last 72 hours. Anemia Panel: No results for input(s): VITAMINB12, FOLATE, FERRITIN, TIBC, IRON, RETICCTPCT in the last 72 hours. Sepsis Labs: No results for input(s): PROCALCITON, LATICACIDVEN in the last 168 hours.  Recent Results (from the past 240 hour(s))  Blood Culture (routine x 2)     Status: Abnormal   Collection Time: 05/18/20 12:27 PM   Specimen: BLOOD  Result Value Ref Range Status   Specimen Description   Final    BLOOD BLOOD LEFT HAND Performed at [facility], [address] [place], [place] [address]    Special Requests   Final    BOTTLES DRAWN AEROBIC AND ANAEROBIC Blood Culture adequate volume Performed at [facility], [address] [place], [place] [address]    Culture  Setup Time   Final    Organism ID to follow [place] AND ANAEROBIC BOTTLES CRITICAL RESULT CALLED TO, READ BACK BY AND VERIFIED WITH: SCOTT HALL 05/19/20 AT 0010 HS Performed at [facility], [address] [place], [place] [address]    Culture STAPHYLOCOCCUS AUREUS (A)  Final   Report Status 05/21/2020 FINAL  Final   Organism ID, Bacteria STAPHYLOCOCCUS AUREUS  Final      Susceptibility   Staphylococcus aureus - MIC*    CIPROFLOXACIN <=  0.5 SENSITIVE Sensitive     ERYTHROMYCIN >=8 RESISTANT Resistant     GENTAMICIN <=0.5 SENSITIVE Sensitive     OXACILLIN 0.5 SENSITIVE Sensitive     TETRACYCLINE <=1 SENSITIVE Sensitive     VANCOMYCIN <=0.5 SENSITIVE Sensitive     TRIMETH/SULFA <=10 SENSITIVE Sensitive     CLINDAMYCIN <=0.25 SENSITIVE Sensitive     RIFAMPIN <=0.5 SENSITIVE Sensitive     Inducible Clindamycin NEGATIVE Sensitive     * STAPHYLOCOCCUS AUREUS  Blood Culture (routine x 2)     Status: Abnormal   Collection Time: 05/18/20 12:27 PM   Specimen: BLOOD  Result Value Ref Range Status   Specimen Description   Final    BLOOD BLOOD LEFT FOREARM Performed at [facility], [address] [place], [place] [address]    Special Requests   Final    BOTTLES DRAWN AEROBIC AND ANAEROBIC Blood Culture adequate volume Performed at [facility], [address] [place], [place] [address]    Culture  Setup Time   Final    GRAM POSITIVE COCCI IN BOTH AEROBIC AND ANAEROBIC BOTTLES CRITICAL VALUE NOTED.  VALUE IS CONSISTENT WITH PREVIOUSLY REPORTED AND CALLED VALUE. Performed at [facility], [address] [place], [place] [address]    Culture (A)  Final    STAPHYLOCOCCUS AUREUS SUSCEPTIBILITIES PERFORMED  ON PREVIOUS CULTURE WITHIN THE LAST 5 DAYS. Performed at [facility], [place] [address] [place],  [address]    Report Status 05/21/2020 FINAL  Final  Blood Culture ID Panel (Reflexed)     Status: Abnormal   Collection Time: 05/18/20 12:27 PM  Result Value Ref Range Status   Enterococcus faecalis NOT DETECTED NOT DETECTED Final   Enterococcus Faecium NOT DETECTED NOT DETECTED Final   Listeria monocytogenes NOT DETECTED NOT DETECTED Final   Staphylococcus species DETECTED (A) NOT DETECTED Final    Comment: CRITICAL RESULT CALLED TO, READ BACK BY AND VERIFIED WITH: SCOTT HALL 05/19/20 AT 0010 HS    Staphylococcus aureus (BCID) DETECTED (A) NOT DETECTED Final    Comment: CRITICAL RESULT CALLED TO, READ BACK BY AND VERIFIED WITH: SCOTT HALL 05/19/20 AT 0010 HS    Staphylococcus epidermidis NOT DETECTED NOT DETECTED Final   Staphylococcus lugdunensis NOT DETECTED NOT DETECTED Final   Streptococcus species NOT DETECTED NOT DETECTED Final   Streptococcus agalactiae NOT DETECTED NOT DETECTED Final   Streptococcus pneumoniae NOT DETECTED NOT DETECTED Final   Streptococcus pyogenes NOT DETECTED NOT DETECTED Final   A.calcoaceticus-baumannii NOT DETECTED NOT DETECTED Final   Bacteroides fragilis NOT DETECTED NOT DETECTED Final   Enterobacterales NOT DETECTED NOT DETECTED Final   Enterobacter cloacae complex NOT DETECTED NOT DETECTED Final   Escherichia coli NOT DETECTED NOT DETECTED Final   Klebsiella aerogenes NOT DETECTED NOT DETECTED Final   Klebsiella oxytoca NOT DETECTED NOT DETECTED Final   Klebsiella pneumoniae NOT DETECTED NOT DETECTED Final   Proteus species NOT DETECTED NOT DETECTED Final   Salmonella species NOT DETECTED NOT DETECTED Final   Serratia marcescens NOT DETECTED NOT DETECTED Final   Haemophilus influenzae NOT DETECTED NOT DETECTED Final   Neisseria meningitidis NOT DETECTED NOT DETECTED Final   Pseudomonas aeruginosa NOT DETECTED NOT DETECTED Final    Stenotrophomonas maltophilia NOT DETECTED NOT DETECTED Final   Candida albicans NOT DETECTED NOT DETECTED Final   Candida auris NOT DETECTED NOT DETECTED Final   Candida glabrata NOT DETECTED NOT DETECTED Final   Candida krusei NOT DETECTED NOT DETECTED Final  Candida parapsilosis NOT DETECTED NOT DETECTED Final   Candida tropicalis NOT DETECTED NOT DETECTED Final   Cryptococcus neoformans/gattii NOT DETECTED NOT DETECTED Final   Meth resistant mecA/C and MREJ NOT DETECTED NOT DETECTED Final    Comment: Performed at [facility], [address] [place], [place] [address]  Culture, blood (Routine X 2) w Reflex to ID Panel     Status: None   Collection Time: 05/20/20  4:42 PM   Specimen: BLOOD  Result Value Ref Range Status   Specimen Description BLOOD BLOOD LEFT WRIST  Final   Special Requests   Final    BOTTLES DRAWN AEROBIC AND ANAEROBIC Blood Culture adequate volume   Culture   Final    NO GROWTH 5 DAYS Performed at [facility], [address] [place], [place] [address]    Report Status 05/25/2020 FINAL  Final  Culture, blood (Routine X 2) w Reflex to ID Panel     Status: None   Collection Time: 05/20/20  4:46 PM   Specimen: BLOOD  Result Value Ref Range Status   Specimen Description BLOOD LEFT ANTECUBITAL  Final   Special Requests   Final    BOTTLES DRAWN AEROBIC AND ANAEROBIC Blood Culture results may not be optimal due to an excessive volume of blood received in culture bottles   Culture   Final    NO GROWTH 5 DAYS Performed at [facility], [address], [place], [place] [address]    Report Status 05/25/2020 FINAL  Final         Radiology Studies: [facility] Chest Port 1 View  Result Date: 05/24/2020 CLINICAL DATA:  Hypoxia, COVID EXAM: PORTABLE CHEST 1 VIEW COMPARISON:  05/20/2020.  CT 05/18/2020 FINDINGS: There is hyperinflation of the lungs compatible with COPD. Heart is normal size. Interstitial thickening again noted, unchanged. No  effusions or pneumothorax. Pleural based density noted peripherally at the right lung base, shown to be hematoma surrounding and anterior right 7th rib fracture on prior CT. IMPRESSION: COPD.  Stable interstitial prominence without confluent opacity. Electronically Signed   By: Rolm Baptise M.D.   On: 05/24/2020 15:10        Scheduled Meds: . (feeding supplement) PROSource Plus  30 mL Oral BID BM  . aspirin EC  81 mg Oral Daily  . atorvastatin  10 mg Oral Daily  . clopidogrel  75 mg Oral Daily  . enoxaparin (LOVENOX) injection  40 mg Subcutaneous Q24H  . feeding supplement (ENSURE ENLIVE)  237 mL Oral TID BM  . insulin aspart  0-15 Units Subcutaneous TID WC  . insulin detemir  10 Units Subcutaneous QHS  . mometasone-formoterol  2 puff Inhalation BID   And  . umeclidinium bromide  1 puff Inhalation Daily  . multivitamin with minerals  1 tablet Oral Daily  . nicotine  21 mg Transdermal Daily  . polyethylene glycol  17 g Oral BID  . ramelteon  8 mg Oral QHS  . senna-docusate  1 tablet Oral QHS   Continuous Infusions: . sodium chloride Stopped (05/21/20 1607)  . famotidine (PEPCID) IV    . nafcillin (NAFCIL) continuous infusion 20.8 mL/hr at 05/26/20 0300     LOS: 8 days    Time spent: 25 minutes    Sidney Ace, MD [facility]  If 7PM-7AM, please contact night-coverage 05/26/2020, 12:26 PM

## 2020-05-26 NOTE — Progress Notes (Signed)
Pt not having much oral intake today, uninterested in meals. Pt mainly lays in bed with his eyes closed while speaking.

## 2020-05-27 LAB — BASIC METABOLIC PANEL
Anion gap: 9 (ref 5–15)
BUN: 13 mg/dL (ref 8–23)
CO2: 27 mmol/L (ref 22–32)
Calcium: 7.4 mg/dL — ABNORMAL LOW (ref 8.9–10.3)
Chloride: 99 mmol/L (ref 98–111)
Creatinine, Ser: 0.85 mg/dL (ref 0.61–1.24)
GFR calc Af Amer: >60 mL/min (ref >60)
GFR calc non Af Amer: >60 mL/min (ref >60)
Glucose, Bld: 82 mg/dL (ref 70–99)
Potassium: 3.7 mmol/L (ref 3.5–5.1)
Sodium: 135 mmol/L (ref 135–145)

## 2020-05-27 LAB — CBC WITH DIFFERENTIAL/PLATELET
Abs Immature Granulocytes: 0.05 10*3/uL (ref 0.00–0.07)
Basophils Absolute: 0 10*3/uL (ref 0.0–0.1)
Basophils Relative: 0 %
Eosinophils Absolute: 0.1 10*3/uL (ref 0.0–0.5)
Eosinophils Relative: 1 %
HCT: 42.1 % (ref 39.0–52.0)
Hemoglobin: 13.7 g/dL (ref 13.0–17.0)
Immature Granulocytes: 1 %
Lymphocytes Relative: 16 %
Lymphs Abs: 1.6 10*3/uL (ref 0.7–4.0)
MCH: 29.1 pg (ref 26.0–34.0)
MCHC: 32.5 g/dL (ref 30.0–36.0)
MCV: 89.6 fL (ref 80.0–100.0)
Monocytes Absolute: 0.5 10*3/uL (ref 0.1–1.0)
Monocytes Relative: 5 %
Neutro Abs: 7.8 10*3/uL — ABNORMAL HIGH (ref 1.7–7.7)
Neutrophils Relative %: 77 %
Platelets: 230 10*3/uL (ref 150–400)
RBC: 4.7 MIL/uL (ref 4.22–5.81)
RDW: 13.2 % (ref 11.5–15.5)
WBC: 10.1 10*3/uL (ref 4.0–10.5)
nRBC: 0 % (ref 0.0–0.2)

## 2020-05-27 LAB — GLUCOSE, CAPILLARY
Glucose-Capillary: 90 mg/dL (ref 70–99)
Glucose-Capillary: 70 mg/dL (ref 70–99)
Glucose-Capillary: 85 mg/dL (ref 70–99)
Glucose-Capillary: 226 mg/dL — ABNORMAL HIGH (ref 70–99)

## 2020-05-27 MED ORDER — KETOROLAC TROMETHAMINE 30 MG/ML IJ SOLN
15.0000 mg | Freq: Four times a day (QID) | INTRAMUSCULAR | Status: DC | PRN
  Administered 2020-05-28: 15 mg via INTRAVENOUS
  Filled 2020-05-27 (×2): qty 1

## 2020-05-27 MED ORDER — DEXTROSE-NACL 5-0.9 % IV SOLN: INTRAVENOUS | Status: DC

## 2020-05-27 MED ORDER — ALBUMIN HUMAN 5 % IV SOLN
25.0000 g | Freq: Once | INTRAVENOUS | Status: AC
  Administered 2020-05-27: 25 g via INTRAVENOUS
  Filled 2020-05-27: qty 500

## 2020-05-27 NOTE — Progress Notes (Signed)
Pt too lethargic to attempt to feed lunch tray. Pt does awaken to stimulus, but immediately closes eyes following.

## 2020-05-27 NOTE — Progress Notes (Signed)
PROGRESS NOTE    Jonathan [name]  OHY:[id number] DOB: July 06, 1941 DOA: 05/18/2020 PCP: Abner Greenspan, MD   Brief Narrative:  79 year old male who was recently discharged from the hospital after treatment for COVID-19 viral infection. Presented to ED for evaluation of worsening shortness of breath, nonproductive cough and wheezing and was found to be hypoxic with room air pulse oximetry at rest of 86%. Admitted for COPD exacerbation in the context of recent Covid infection.  COPD exacerbation resolved overnight.  Was found to have MSSA bacteremia which has become the dominant reason for ongoing hospitalization.  Initially refused TEE 8/23, but now agreeable to TEE 8/25.  Source of bacteremia unclear, could be phenomenon related to Covid.  Developed spontaneous left ring finger erythema distally of unclear significance.  Orthopedics following.  Seen by orthopedics, infectious disease and vascular surgery.  Plan for angiogram 8/25 to exclude vascular pathology.   Admit 8/13 to 8/16 for COVID tx w/ remdesivir, steroids. Discharged on steroid taper and compelte outpt remdesivir.   8/13 ED, hypoxic, admitted for COVID  8/12 ED, COVID+, received MAB and was d/c from ED  8/25: Patient seen and examined. Little bit irritated this morning about being n.p.o. Communicated with cardiology this morning. Patient was scheduled for TEE however specials refused to take patient down as he is currently on airborne isolation. Spoke with infection prevention on the floor. Patient's last test was 05/10/2020. He is asymptomatic from a Covid standpoint. No oxygen requirement, no evidence of pneumonia on CT, no fevers. Okay to DC airborne isolation  Communicated with vascular surgery. Plans for angiography of left ring finger today.   8/26: Patient seen and examined.  Irritable this morning.  Apparently his TEE was canceled overnight for reasons that are unclear.  Apparently given concerns for Covid status which was  known by all involved providers.  Notified cardiology this morning.  Case has been rescheduled for 05/25/2020  8/27: Patient seen and examined.  More lethargic this morning.  Less irritable.  TEE once again canceled due to numerous issues including reinstatement of Covid precautions per ID, oxygen requirements of 2 L with baseline COPD, persistent hypokalemia, availability of special procedure staff.  Patient was seen by palliative care.  Recommendations appreciated.  Patient DNR status.    8/28: Patient seen and examined.  We can sleepy this morning.  TEE on hold through the weekend.  Seen by palliative care yesterday.  Reach out to family.  Considering moving towards comfort palliative route should patient's functional status not improve.  Made on 2 L.  Does not appear in any distress.  8/29: Patient seen and examined.  Remains very lethargic.  Seemingly uninterested in food.  Upon awakening does not really participate in interview.  Remains on 2 L.  Hemodynamically stable but blood pressure trending down.  Blood glucose also trending down due to poor p.o. intake.  Assessment & Plan:   Principal Problem:   Bacteremia due to Staphylococcus aureus Active Problems:   Tobacco abuse   Physical deconditioning   Type 2 diabetes mellitus with hyperglycemia, with long-term current use of insulin (HCC)   COVID-19 virus infection   COPD with acute exacerbation (HCC)   Closed rib fracture   Lung mass   Aortic atherosclerosis (HCC)   Emphysema lung (HCC)   Finger pain, left   COPD exacerbation (HCC)   Frailty   Goals of care, counseling/discussion   Advanced care planning/counseling discussion   Palliative care by specialist  Adult failure to thrive  Patient has had a complicated hospital course with MSSA bacteremia of unclear etiology, COPD exacerbation, history of recent Covid infection.  I suspect that his decline is likely multifactorial in nature.  Documentation it appears that his decline has  been progressive over the past several weeks to months.  P.o. intake has decreased, his ability to ambulate has decreased.  Prognosis highly guarded at this point.  I seriously doubt that his functional status will improve enough to make TEE viable option.  Bacteremia due to Staphylococcus aureus --remains afebrile, vital signs stable. --sepsis ruled out --Source remains unclear.  Remote neck surgery.  No other hardware.  No drug use.  Skin appears unremarkable except for distal left ring finger, which has circumferential erythema. Tender but no abscess or lesions. Xray probably nonacute.  Plan: Continue Ancef Follow surveillance cultures, no growth to date ID following, recommendations appreciated Tentative plan for TEE however palliative care has evaluated patient reach out to family.  May elect for more comfort based approach if functional status does not improve by Monday  Finger pain, left --xray nonacute, etiology unclear; may be Covid phenomenon,  --Appreciate orthopedic evaluation.  Will continue to follow.  Etiology unclear. --Status post angiography with left subclavian stent placement on 05/23/2020  Type 2 diabetes mellitus with hyperglycemia, with long-term current use of insulin (HCC) -Good control over interval. Basal bolus regimen  COPD with acute exacerbation (HCC) with acute hypoxic respiratory failure --resolved. On RA --Continue bronchodilators.  Continue supplemental oxygen.  COVID-19 virus infection --Status post treatment last hospitalization with remdesivir and steroids.  Appears asymptomatic at this time.  Also received monoclonal antibody infusion 8/12. --Asymptomatic. --Positive test 05/10/2020. Communicated with infection prevention. DC airborne isolation precautions Patient is out of 10-day window required for isolation.  Covid isolation was reinstated by infectious disease  Closed rib fracture --Minimally displaced right anterolateral seventh rib  fracture, with small surrounding hematoma. No evidence of hemothorax or pneumothorax.  Monitor clinically. 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 PET-CT is recommended for further Evaluation. --f/u as outpt -- I discussed w/ wife and daughter by telephone 8/22  Aortic atherosclerosis ([place]) --continue statin  Emphysema lung ([place]) --as per COPD   DVT prophylaxis: Lovenox Code Status: Full Family Communication: Wife and daughter [phone] on 05/27/2020 Disposition Plan: Status is: Inpatient  Remains inpatient appropriate because:Inpatient level of care appropriate due to severity of illness   Dispo: The patient is from: Home              Anticipated d/c is to: TBD              Anticipated d/c date is: 2 days              Patient currently is not medically stable to d/c.   Patient has had significant functional decline.  Likely multifactorial in nature.  Prognosis guarded.  Spoke at length with patient's family today.  They are aware the patient is declining.  We went through all his acute medical issues.  They would have possible like to see and speak with the patient.  Communicated with RN.  They will attempt to set up iPad video chat.  In the meantime I will reorder a Covid test today.  We could potentially try left isolation precautions if his Covid tested negative.  Infection prevention will need to be involved.   Consultants:   Vascular surgery  Cardiology  Procedures:  2D echocardiogram  Antimicrobials:   Ancef   Subjective: Patient seen and examined.  Lethargic this morning.  Does not participate in interview  Objective: Vitals:   05/27/20 0806 05/27/20 0820 05/27/20 1013 05/27/20 1208  BP: (!) 91/41  (!) 139/43 (!) 123/27  Pulse:   71 61  Resp:   20 12  Temp:   98.6 F (37 C) 98 F (36.7 C)  TempSrc:      SpO2: 94% 95% 95% 97%  Weight:      Height:         Intake/Output Summary (Last 24 hours) at 05/27/2020 1306 Last data filed at 05/26/2020 2300 Gross per 24 hour  Intake 895.81 ml  Output --  Net 895.81 ml   Filed Weights   05/18/20 1213 05/23/20 1549  Weight: 71.2 kg 71.2 kg    Examination:  General: Lethargic.  Appears ill HEENT: Normocephalic, atraumatic Neck, supple, trachea midline, no tenderness Heart: Regular rate and rhythm, S1/S2 normal, no murmurs Lungs: Poor inspiratory effort.  2 L nasal cannula.  Lung sounds diffusely decreased Abdomen: Soft, nontender, nondistended, positive bowel sounds Extremities: Normal, atraumatic, no clubbing or cyanosis, normal muscle tone Skin: No rashes or lesions, normal color Neurologic: Cranial nerves grossly intact, sensation intact, alert and oriented x3 Psychiatric: Flattened affect    Data Reviewed: I have personally reviewed following labs and imaging studies  CBC: Recent Labs  Lab 05/21/20 0537 05/22/20 0435 05/24/20 0626  WBC 28.0* 21.5* 15.6*  HGB 17.8* 16.2 15.3  HCT 52.4* 47.6 45.2  MCV 84.9 85.9 86.9  PLT 289 275 101   Basic Metabolic Panel: Recent Labs  Lab 05/23/20 0655 05/24/20 0626 05/25/20 0502 05/26/20 0343  NA 141 140  --  134*  K 2.6* 2.6* 2.9* 3.0*  CL 94* 96*  --  91*  CO2 36* 31  --  31  GLUCOSE 71 117*  --  246*  BUN 21 21  --  15  CREATININE 1.00 1.00 0.84 1.01  CALCIUM 7.9* 7.4*  --  7.4*  MG  --   --  2.2 2.0   GFR: Estimated Creatinine Clearance: 51.6 mL/min (by C-G formula based on SCr of 1.01 mg/dL). Liver Function Tests: No results for input(s): AST, ALT, ALKPHOS, BILITOT, PROT, ALBUMIN in the last 168 hours. No results for input(s): LIPASE, AMYLASE in the last 168 hours. No results for input(s): AMMONIA in the last 168 hours. Coagulation Profile: No results for input(s): INR, PROTIME in the last 168 hours. Cardiac Enzymes: No results for input(s): CKTOTAL, CKMB, CKMBINDEX, TROPONINI in the last 168 hours. BNP (last 3  results) No results for input(s): PROBNP in the last 8760 hours. HbA1C: No results for input(s): HGBA1C in the last 72 hours. CBG: Recent Labs  Lab 05/26/20 1225 05/26/20 1640 05/26/20 2029 05/27/20 0754 05/27/20 1213  GLUCAP 197* 161* 141* 90 70   Lipid Profile: No results for input(s): CHOL, HDL, LDLCALC, TRIG, CHOLHDL, LDLDIRECT in the last 72 hours. Thyroid Function Tests: No results for input(s): TSH, T4TOTAL, FREET4, T3FREE, THYROIDAB in the last 72 hours. Anemia Panel: No results for input(s): VITAMINB12, FOLATE, FERRITIN, TIBC, IRON, RETICCTPCT in the last 72 hours. Sepsis Labs: No results for input(s): PROCALCITON, LATICACIDVEN in the last 168 hours.  Recent Results (from the past 240 hour(s))  Blood Culture (routine x 2)     Status: Abnormal   Collection Time: 05/18/20 12:27 PM   Specimen: BLOOD  Result Value Ref Range Status   Specimen Description  Final    BLOOD BLOOD LEFT HAND Performed at [facility], [address] [place], [place] [address]    Special Requests   Final    BOTTLES DRAWN AEROBIC AND ANAEROBIC Blood Culture adequate volume Performed at [facility], [address] [place], [place] [address]    Culture  Setup Time   Final    Organism ID to follow [place] AEROBIC AND ANAEROBIC BOTTLES CRITICAL RESULT CALLED TO, READ BACK BY AND VERIFIED WITH: SCOTT HALL 05/19/20 AT 0010 HS Performed at [facility], [address] [place], [place] [address]    Culture STAPHYLOCOCCUS AUREUS (A)  Final   Report Status 05/21/2020 FINAL  Final   Organism ID, Bacteria STAPHYLOCOCCUS AUREUS  Final      Susceptibility   Staphylococcus aureus - MIC*    CIPROFLOXACIN <=0.5 SENSITIVE Sensitive     ERYTHROMYCIN >=8 RESISTANT Resistant     GENTAMICIN <=0.5 SENSITIVE Sensitive     OXACILLIN 0.5 SENSITIVE Sensitive     TETRACYCLINE <=1 SENSITIVE Sensitive     VANCOMYCIN <=0.5 SENSITIVE Sensitive     TRIMETH/SULFA  <=10 SENSITIVE Sensitive     CLINDAMYCIN <=0.25 SENSITIVE Sensitive     RIFAMPIN <=0.5 SENSITIVE Sensitive     Inducible Clindamycin NEGATIVE Sensitive     * STAPHYLOCOCCUS AUREUS  Blood Culture (routine x 2)     Status: Abnormal   Collection Time: 05/18/20 12:27 PM   Specimen: BLOOD  Result Value Ref Range Status   Specimen Description   Final    BLOOD BLOOD LEFT FOREARM Performed at [facility], [address], [place], [place] [address]    Special Requests   Final    BOTTLES DRAWN AEROBIC AND ANAEROBIC Blood Culture adequate volume Performed at [facility], [address] [place], [place] [address]    Culture  Setup Time   Final    GRAM POSITIVE COCCI IN BOTH AEROBIC AND ANAEROBIC BOTTLES CRITICAL VALUE NOTED.  VALUE IS CONSISTENT WITH PREVIOUSLY REPORTED AND CALLED VALUE. Performed at [facility], [address] [place], [place] [address]    Culture (A)  Final    STAPHYLOCOCCUS AUREUS SUSCEPTIBILITIES PERFORMED ON PREVIOUS CULTURE WITHIN THE LAST 5 DAYS. Performed at [facility], [place] [address] [place], [place] [address]    Report Status 05/21/2020 FINAL  Final  Blood Culture ID Panel (Reflexed)     Status: Abnormal   Collection Time: 05/18/20 12:27 PM  Result Value Ref Range Status   Enterococcus faecalis NOT DETECTED NOT DETECTED Final   Enterococcus Faecium NOT DETECTED NOT DETECTED Final   Listeria monocytogenes NOT DETECTED NOT DETECTED Final   Staphylococcus species DETECTED (A) NOT DETECTED Final    Comment: CRITICAL RESULT CALLED TO, READ BACK BY AND VERIFIED WITH: SCOTT HALL 05/19/20 AT 0010 HS    Staphylococcus aureus (BCID) DETECTED (A) NOT DETECTED Final    Comment: CRITICAL RESULT CALLED TO, READ BACK BY AND VERIFIED WITH: SCOTT HALL 05/19/20 AT 0010 HS    Staphylococcus epidermidis NOT DETECTED NOT DETECTED Final   Staphylococcus lugdunensis NOT DETECTED NOT DETECTED Final   Streptococcus species NOT DETECTED NOT  DETECTED Final   Streptococcus agalactiae NOT DETECTED NOT DETECTED Final   Streptococcus pneumoniae NOT DETECTED NOT DETECTED Final   Streptococcus pyogenes NOT DETECTED NOT DETECTED Final   A.calcoaceticus-baumannii NOT DETECTED NOT DETECTED Final   Bacteroides fragilis NOT DETECTED NOT DETECTED Final   Enterobacterales NOT DETECTED NOT  DETECTED Final   Enterobacter cloacae complex NOT DETECTED NOT DETECTED Final   Escherichia coli NOT DETECTED NOT DETECTED Final   Klebsiella aerogenes NOT DETECTED NOT DETECTED Final   Klebsiella oxytoca NOT DETECTED NOT DETECTED Final   Klebsiella pneumoniae NOT DETECTED NOT DETECTED Final   Proteus species NOT DETECTED NOT DETECTED Final   Salmonella species NOT DETECTED NOT DETECTED Final   Serratia marcescens NOT DETECTED NOT DETECTED Final   Haemophilus influenzae NOT DETECTED NOT DETECTED Final   Neisseria meningitidis NOT DETECTED NOT DETECTED Final   Pseudomonas aeruginosa NOT DETECTED NOT DETECTED Final   Stenotrophomonas maltophilia NOT DETECTED NOT DETECTED Final   Candida albicans NOT DETECTED NOT DETECTED Final   Candida auris NOT DETECTED NOT DETECTED Final   Candida glabrata NOT DETECTED NOT DETECTED Final   Candida krusei NOT DETECTED NOT DETECTED Final   Candida parapsilosis NOT DETECTED NOT DETECTED Final   Candida tropicalis NOT DETECTED NOT DETECTED Final   Cryptococcus neoformans/gattii NOT DETECTED NOT DETECTED Final   Meth resistant mecA/C and MREJ NOT DETECTED NOT DETECTED Final    Comment: Performed at [facility], [address] [place], [place] [address]  Culture, blood (Routine X 2) w Reflex to ID Panel     Status: None   Collection Time: 05/20/20  4:42 PM   Specimen: BLOOD  Result Value Ref Range Status   Specimen Description BLOOD BLOOD LEFT WRIST  Final   Special Requests   Final    BOTTLES DRAWN AEROBIC AND ANAEROBIC Blood Culture adequate volume   Culture   Final    NO GROWTH 5 DAYS Performed at  [facility], [address] [place], [place] [address]    Report Status 05/25/2020 FINAL  Final  Culture, blood (Routine X 2) w Reflex to ID Panel     Status: None   Collection Time: 05/20/20  4:46 PM   Specimen: BLOOD  Result Value Ref Range Status   Specimen Description BLOOD LEFT ANTECUBITAL  Final   Special Requests   Final    BOTTLES DRAWN AEROBIC AND ANAEROBIC Blood Culture results may not be optimal due to an excessive volume of blood received in culture bottles   Culture   Final    NO GROWTH 5 DAYS Performed at [facility], [address], [place], [place] [address]    Report Status 05/25/2020 FINAL  Final         Radiology Studies: No results found.      Scheduled Meds: . (feeding supplement) PROSource Plus  30 mL Oral BID BM  . aspirin EC  81 mg Oral Daily  . atorvastatin  10 mg Oral Daily  . clopidogrel  75 mg Oral Daily  . enoxaparin (LOVENOX) injection  40 mg Subcutaneous Q24H  . feeding supplement (ENSURE ENLIVE)  237 mL Oral TID BM  . insulin aspart  0-15 Units Subcutaneous TID WC  . insulin detemir  10 Units Subcutaneous QHS  . mometasone-formoterol  2 puff Inhalation BID   And  . umeclidinium bromide  1 puff Inhalation Daily  . multivitamin with minerals  1 tablet Oral Daily  . nicotine  21 mg Transdermal Daily  . polyethylene glycol  17 g Oral BID  . ramelteon  8 mg Oral QHS  . senna-docusate  1 tablet Oral QHS   Continuous Infusions: . sodium chloride Stopped (05/21/20 1607)  . dextrose 5 % and 0.9% NaCl 50 mL/hr at 05/27/20 1246  . famotidine (PEPCID) IV    .  nafcillin (NAFCIL) continuous infusion 20.8 mL/hr at 05/26/20 2300     LOS: 9 days    Time spent: 25 minutes    Sidney Ace, MD [facility]  If 7PM-7AM, please contact night-coverage 05/27/2020, 1:06 PM

## 2020-05-27 NOTE — Care Management (Signed)
Had a long discussion with the patient's family regarding the patient's functional decline.  Relayed that the patient's hemodynamics have been stable but his blood pressure has been drifting downward and his level of consciousness is waxing and waning.  He expressed understanding.  He is also not eating very much and undigested seemingly in food.  The family obviously had concerns and expressed desire to come and visit.  I stated that I would reach out to infectious disease further recommendations.  Secure chat sent to infectious disease MD on call Dr. Baxter Flattery.  Case was discussed and per ID okay to remove respiratory isolation for now.  No need to repeat Covid test.  Family can visit within the restrictions of the unit in the hospital.  We will still need to wear masks while in the room with the patient.  Ralene Muskrat MD

## 2020-05-28 DIAGNOSIS — J9601 Acute respiratory failure with hypoxia: Secondary | ICD-10-CM

## 2020-05-28 LAB — CBC WITH DIFFERENTIAL/PLATELET
Abs Immature Granulocytes: 0.06 10*3/uL (ref 0.00–0.07)
Basophils Absolute: 0 10*3/uL (ref 0.0–0.1)
Basophils Relative: 0 %
Eosinophils Absolute: 0.1 10*3/uL (ref 0.0–0.5)
Eosinophils Relative: 1 %
HCT: 42.1 % (ref 39.0–52.0)
Hemoglobin: 13.7 g/dL (ref 13.0–17.0)
Immature Granulocytes: 1 %
Lymphocytes Relative: 13 %
Lymphs Abs: 1.4 10*3/uL (ref 0.7–4.0)
MCH: 29.1 pg (ref 26.0–34.0)
MCHC: 32.5 g/dL (ref 30.0–36.0)
MCV: 89.4 fL (ref 80.0–100.0)
Monocytes Absolute: 0.5 10*3/uL (ref 0.1–1.0)
Monocytes Relative: 5 %
Neutro Abs: 8.6 10*3/uL — ABNORMAL HIGH (ref 1.7–7.7)
Neutrophils Relative %: 80 %
Platelets: 241 10*3/uL (ref 150–400)
RBC: 4.71 MIL/uL (ref 4.22–5.81)
RDW: 13.2 % (ref 11.5–15.5)
WBC: 10.6 10*3/uL — ABNORMAL HIGH (ref 4.0–10.5)
nRBC: 0 % (ref 0.0–0.2)

## 2020-05-28 LAB — BASIC METABOLIC PANEL
Anion gap: 6 (ref 5–15)
BUN: 15 mg/dL (ref 8–23)
CO2: 30 mmol/L (ref 22–32)
Calcium: 7.5 mg/dL — ABNORMAL LOW (ref 8.9–10.3)
Chloride: 101 mmol/L (ref 98–111)
Creatinine, Ser: 0.89 mg/dL (ref 0.61–1.24)
GFR calc Af Amer: >60 mL/min (ref >60)
GFR calc non Af Amer: >60 mL/min (ref >60)
Glucose, Bld: 120 mg/dL — ABNORMAL HIGH (ref 70–99)
Potassium: 3.4 mmol/L — ABNORMAL LOW (ref 3.5–5.1)
Sodium: 137 mmol/L (ref 135–145)

## 2020-05-28 LAB — GLUCOSE, CAPILLARY
Glucose-Capillary: 129 mg/dL — ABNORMAL HIGH (ref 70–99)
Glucose-Capillary: 188 mg/dL — ABNORMAL HIGH (ref 70–99)
Glucose-Capillary: 170 mg/dL — ABNORMAL HIGH (ref 70–99)
Glucose-Capillary: 239 mg/dL — ABNORMAL HIGH (ref 70–99)

## 2020-05-28 LAB — MAGNESIUM: Magnesium: 1.9 mg/dL (ref 1.7–2.4)

## 2020-05-28 MED ORDER — POTASSIUM CHLORIDE CRYS ER 20 MEQ PO TBCR
40.0000 meq | EXTENDED_RELEASE_TABLET | Freq: Once | ORAL | Status: AC
  Administered 2020-05-28: 40 meq via ORAL
  Filled 2020-05-28: qty 2

## 2020-05-28 NOTE — TOC Progression Note (Signed)
Transition of Care [facility]) - Progression Note    Patient Details  Name: Jonathan [name] MRN: [id number] Date of Birth: Dec 08, 1940  [facility]) CM/SW Contact  Beverly Sessions, RN Phone Number: 05/28/2020, 3:39 PM  Clinical Narrative:     Discharge disposition is home with hospice Discussed by phone with wife and daughter.  They are in agreement to hospice services and do not have a preference of hospice agency.  Referral made to Santiago Glad with [employer].   Patient lives in a separate residence with his girlfriend who is his caregiver. Wife and daughter would like to meet with Santiago Glad along with patient's girlfriend [name] to discuss discharge planning  Patient has Home O2, BSC, WC and RW in the home   Update:  Santiago Glad has met with family Plan for patient to discharge tomorrow home with hospice with his girlfriend faith.  Everyone is in agreement with the plan.  Santiago Glad is arranging for delivery of hospital bed.    Patient will require EMS transport.  MD to sign DNR Expected Discharge Plan: Home w Hospice Care Barriers to Discharge: Continued Medical Work up  Expected Discharge Plan and Services Expected Discharge Plan: [place] Choice: [place] arrangements for the past 2 months: Single Family Home                                       Social Determinants of Health (SDOH) Interventions    Readmission Risk Interventions No flowsheet data found.

## 2020-05-28 NOTE — Progress Notes (Signed)
   05/28/20 0800  Assess: MEWS Score  Temp 97.9 F (36.6 C) (axillary)  BP (!) 120/50  Pulse Rate 81  Resp (!) 28  Level of Consciousness Alert  SpO2 100 %  O2 Device Nasal Cannula  O2 Flow Rate (L/min) 2 L/min  Assess: MEWS Score  MEWS Temp 0  MEWS Systolic 0  MEWS Pulse 0  MEWS RR 2  MEWS LOC 0  MEWS Score 2  MEWS Score Color Yellow  Assess: if the MEWS score is Yellow or Red  Were vital signs taken at a resting state? Yes  Focused Assessment No change from prior assessment  Early Detection of Sepsis Score *See Row Information* Low  MEWS guidelines implemented *See Row Information* Yes  Treat  MEWS Interventions Administered prn meds/treatments  Pain Scale 0-10  Pain Score 0 (pt stated his buttocks was numb)  Take Vital Signs  Increase Vital Sign Frequency  Yellow: Q 2hr X 2 then Q 4hr X 2, if remains yellow, continue Q 4hrs  Escalate  MEWS: Escalate Yellow: discuss with charge nurse/RN and consider discussing with provider and RRT  Notify: Charge Nurse/RN  Name of Charge Nurse/RN Notified Percell Miller, RN  Date Charge Nurse/RN Notified 05/28/20  Time Charge Nurse/RN Notified 0919  Notify: Provider  Provider Name/Title Sreenath   Date Provider Notified 05/28/20  Time Provider Notified [phone]  Notification Type Page  Notification Reason Change in status  Response Other (Comment) (awaiting for response)  Date of Provider Response  (awaiting response)  Time of Provider Response  (awaiting response)  Notify: Rapid Response  Name of Rapid Response RN Notified  (not notified)  Document  Patient Outcome Not stable and remains on department  Yellow MEWS score intervention started. MD, charge nurse, and nurse tech notified.

## 2020-05-28 NOTE — Progress Notes (Signed)
Physical Therapy Treatment Patient Details Name: Jonathan [name] MRN: [id number] DOB: 1941-04-03 Today's Date: 05/28/2020    History of Present Illness Pt is a 79 y.o. 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  He tested positive for SARS-CoV-2 and received Regeneron and was discharged home.  MD assessment now includes: COPD with acute exacerbation, deconditioning, and Covid-19.    PT Comments    Pt received in supine position upon arrival to room.  Pt was in a flexed left sidelying position in hospital bed and pt's significant other was present throughout treatment session.  Pt much more agreeable to therapy with significant other present and was able to perform supine bed exercises as listed below.  Pt did refuse to come seated EOB due to pain in L hip and feeling fatigued after exercises.  Pt SpO2 stats were monitored throughout session, and noted to be >94% on 2L of supplemental O2.  Pt and significant other requested a toothbrush for oral hygiene which were given to them at conclusion of therapy.  Significant other reported that they were calling in hospice at home to be available at d/c.  Pt will benefit from PT services in a SNF setting upon discharge to safely address deficits listed in patient problem list for decreased caregiver assistance and eventual return to PLOF.     Follow Up Recommendations  SNF     Equipment Recommendations  Other (comment) (to be determinted at next level of care )    Recommendations for Other Services       Precautions / Restrictions Precautions Precautions: Fall Restrictions Weight Bearing Restrictions: No    Mobility  Bed Mobility                  Transfers                    Ambulation/Gait                 Stairs             Wheelchair Mobility    Modified Rankin (Stroke Patients Only)        Balance       Sitting balance - Comments: Pt refused to come seated EOB.                                    Cognition Arousal/Alertness: Awake/alert Behavior During Therapy: Agitated;Restless Overall Cognitive Status: Within Functional Limits for tasks assessed                                 General Comments: Pt much more agreeable to therapy with significant other present in the room.      Exercises Total Joint Exercises Ankle Circles/Pumps: AROM;Strengthening;Both;10 reps;Supine Quad Sets: AROM;Strengthening;Both;10 reps;Supine Gluteal Sets: AROM;Strengthening;Both;10 reps;Supine Hip ABduction/ADduction: AROM;Strengthening;Both;10 reps;Supine Straight Leg Raises: AROM;Strengthening;Both;10 reps;Supine Other Exercises Other Exercises: Education provided for pressure relief of the L hip while in hospital bed.  Pt agreeable and was able to assist with mobility.    General Comments        Pertinent Vitals/Pain Pain Location: pain/soreness reported in L hip from being in L sidelying position. Pain Descriptors / Indicators: Sore Pain Intervention(s): Limited activity within patient's tolerance;Monitored during session;Repositioned  Home Living                      Prior Function            PT Goals (current goals can now be found in the care plan section) Acute Rehab PT Goals Patient Stated Goal: to return home  PT Goal Formulation: Patient unable to participate in goal setting Time For Goal Achievement: 06/01/20 Potential to Achieve Goals: Fair Progress towards PT goals: Progressing toward goals    Frequency    Min 2X/week      PT Plan Current plan remains appropriate    Co-evaluation              AM-PAC PT "6 Clicks" Mobility   Outcome Measure  Help needed turning from your back to your side while in a flat bed without using bedrails?: A Little Help needed moving from lying on your back to sitting on  the side of a flat bed without using bedrails?: A Little Help needed moving to and from a bed to a chair (including a wheelchair)?: A Lot Help needed standing up from a chair using your arms (e.g., wheelchair or bedside chair)?: A Lot Help needed to walk in hospital room?: A Lot Help needed climbing 3-5 steps with a railing? : Total 6 Click Score: 13    End of Session Equipment Utilized During Treatment: Gait belt;Oxygen Activity Tolerance: Treatment limited secondary to agitation Patient left: in bed;with call bell/phone within reach;with bed alarm set;with family/visitor present Nurse Communication: Mobility status PT Visit Diagnosis: Unsteadiness on feet (R26.81);Difficulty in walking, not elsewhere classified (R26.2);Muscle weakness (generalized) (M62.81)     Time: 2703-5009 PT Time Calculation (min) (ACUTE ONLY): 13 min  Charges:  $Therapeutic Exercise: 8-22 mins                     Gwenlyn Saran, PT, DPT 05/28/20, 3:56 PM

## 2020-05-28 NOTE — Progress Notes (Signed)
PROGRESS NOTE    Jonathan [name]  YTK:[id number] DOB: [birth year] DOA: 05/18/2020 PCP: Abner Greenspan, MD   Brief Narrative:  79 year old male who was recently discharged from the hospital after treatment for COVID-19 viral infection. Presented to ED for evaluation of worsening shortness of breath, nonproductive cough and wheezing and was found to be hypoxic with room air pulse oximetry at rest of 86%. Admitted for COPD exacerbation in the context of recent Covid infection.  COPD exacerbation resolved overnight.  Was found to have MSSA bacteremia which has become the dominant reason for ongoing hospitalization.  Initially refused TEE 8/23, but now agreeable to TEE 8/25.  Source of bacteremia unclear, could be phenomenon related to Covid.  Developed spontaneous left ring finger erythema distally of unclear significance.  Orthopedics following.  Seen by orthopedics, infectious disease and vascular surgery.  Plan for angiogram 8/25 to exclude vascular pathology.   Admit 8/13 to 8/16 for COVID tx w/ remdesivir, steroids. Discharged on steroid taper and compelte outpt remdesivir.   8/13 ED, hypoxic, admitted for COVID  8/12 ED, COVID+, received MAB and was d/c from ED  8/25: Patient seen and examined. Little bit irritated this morning about being n.p.o. Communicated with cardiology this morning. Patient was scheduled for TEE however specials refused to take patient down as he is currently on airborne isolation. Spoke with infection prevention on the floor. Patient's last test was 05/10/2020. He is asymptomatic from a Covid standpoint. No oxygen requirement, no evidence of pneumonia on CT, no fevers. Okay to DC airborne isolation  Communicated with vascular surgery. Plans for angiography of left ring finger today.   8/26: Patient seen and examined.  Irritable this morning.  Apparently his TEE was canceled overnight for reasons that are unclear.  Apparently given concerns for Covid status which was  known by all involved providers.  Notified cardiology this morning.  Case has been rescheduled for 05/25/2020  8/27: Patient seen and examined.  More lethargic this morning.  Less irritable.  TEE once again canceled due to numerous issues including reinstatement of Covid precautions per ID, oxygen requirements of 2 L with baseline COPD, persistent hypokalemia, availability of special procedure staff.  Patient was seen by palliative care.  Recommendations appreciated.  Patient DNR status.    8/28: Patient seen and examined.  We can sleepy this morning.  TEE on hold through the weekend.  Seen by palliative care yesterday.  Reach out to family.  Considering moving towards comfort palliative route should patient's functional status not improve.  Made on 2 L.  Does not appear in any distress.  8/29: Patient seen and examined.  Remains very lethargic.  Seemingly uninterested in food.  Upon awakening does not really participate in interview.  Remains on 2 L.  Hemodynamically stable but blood pressure trending down.  Blood glucose also trending down due to poor p.o. intake.  8/30: Patient seen and examined.  He is more awake on my evaluation this morning.  Remains ill-appearing.  Vital signs stable.  Mildly tachypneic.  2 L nasal cannula.  I had a lengthy conversation with the patient today.  He simply stated desire to go home.  I explained the ramifications of this.  I related that I spoke to his wife and daughter about this yesterday.  If the patient is to go home he should go home under hospice care.  Patient understands that he would likely clinically deteriorate once he returns back home but his focus on quality of life.  Assessment & Plan:   Principal Problem:   Bacteremia due to Staphylococcus aureus Active Problems:   Tobacco abuse   Physical deconditioning   Type 2 diabetes mellitus with hyperglycemia, with long-term current use of insulin (HCC)   COVID-19 virus infection   COPD with acute  exacerbation (HCC)   Closed rib fracture   Lung mass   Aortic atherosclerosis (HCC)   Emphysema lung (HCC)   Finger pain, left   COPD exacerbation (HCC)   Frailty   Goals of care, counseling/discussion   Advanced care planning/counseling discussion   Palliative care by specialist  Adult failure to thrive Patient has had a complicated hospital course with MSSA bacteremia of unclear etiology, COPD exacerbation, history of recent Covid infection.  I suspect that his decline is likely multifactorial in nature.  Documentation it appears that his decline has been progressive over the past several weeks to months.  P.o. intake has decreased, his ability to ambulate has decreased.  Prognosis highly guarded at this point.  I seriously doubt that his functional status will improve enough to make TEE viable option.  8/30: After lengthy discussions with the patient's wife, daughter and the patient himself in regards to overall clinical decline.  The patient has no desire for any further invasive procedures or interventions.  He simply wants to return home.  He understands that home under hospice care would likely be his only option to return home at this time.  This is likely the route that we are pursuing.  TOC and hospice liaison are aware and will reach out to patient's family as well as caregivers to determine the next appropriate step.  In the meantime we will avoid any further invasive procedures.  Minimize IV sticks.  Continue antibiotics for now until formal disposition plan is made  Bacteremia due to Staphylococcus aureus --remains afebrile, vital signs stable. --sepsis ruled out --Source remains unclear.  Remote neck surgery.  No other hardware.  No drug use.  Skin appears unremarkable except for distal left ring finger, which has circumferential erythema. Tender but no abscess or lesions. Xray probably nonacute.  Plan: Continue Ancef Follow surveillance cultures, no growth to date ID  following, recommendations appreciated TEE on hold.  Patient does not wish for any other invasive procedures  Finger pain, left --xray nonacute, etiology unclear; may be Covid phenomenon,  --Appreciate orthopedic evaluation.  Will continue to follow.  Etiology unclear. --Status post angiography with left subclavian stent placement on 05/23/2020  Type 2 diabetes mellitus with hyperglycemia, with long-term current use of insulin (HCC) -Good control over interval. Basal bolus regimen  COPD with acute exacerbation (HCC) with acute hypoxic respiratory failure --resolved. On RA --Continue bronchodilators.  Continue supplemental oxygen.  COVID-19 virus infection --Status post treatment last hospitalization with remdesivir and steroids.  Appears asymptomatic at this time.  Also received monoclonal antibody infusion 8/12. --Asymptomatic. --Positive test 05/10/2020. Communicated with infection prevention. DC airborne isolation precautions Patient is out of 10-day window required for isolation.  Covid isolation initially discontinued and was restarted by infectious disease.  I reached out to infection prevention/ID Dr. Baxter Flattery on 05/27/2020.  She reviewed the case and determined appropriate for lifting of isolation precautions.  Closed rib fracture --Minimally displaced right anterolateral seventh rib fracture, with small surrounding hematoma. No evidence of hemothorax or pneumothorax.  Monitor clinically. 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 PET-CT is recommended for further Evaluation. --f/u as outpt --  I discussed w/ wife and daughter by telephone 8/22  Aortic atherosclerosis ([place]) --continue statin  Emphysema lung ([place]) --as per COPD   DVT prophylaxis: Lovenox Code Status: Full Family Communication: Wife and daughter [phone] on 05/27/2020.  Met with them at bedside as well Disposition  Plan: Status is: Inpatient  Remains inpatient appropriate because:Inpatient level of care appropriate due to severity of illness   Dispo: The patient is from: Home              Anticipated d/c is to: TBD              Anticipated d/c date is: 2 days              Patient currently is not medically stable to d/c.   Patient has had significant functional decline.  Likely multifactorial in nature.  Prognosis guarded.  Spoke at length with patient's family 8/29.  They are aware the patient is declining.  We went through all his acute medical issues.  They were able to come and visit at bedside on 05/27/2020.  I spoke to them about his clinical deterioration and overall goals of care.  They agree that they would not want any more invasive procedures.  Would like to bring patient home if possible.  TOC and hospice liaison aware.  Working towards formal disposition plan.  Anticipate home with hospice care.  Consultants:   Vascular surgery  Cardiology  Procedures:   2D echocardiogram  Antimicrobials:   Ancef   Subjective: Patient seen and examined.  Appears weak and fatigued.  Mentating well this morning.  Expresses desire to go home.  Objective: Vitals:   05/28/20 0405 05/28/20 0800 05/28/20 1000 05/28/20 1100  BP: (!) 124/53 (!) 120/50 (!) 118/42 (!) 114/42  Pulse: 72 81 78 75  Resp: 20 (!) 28 (!) 24 (!) 22  Temp: 97.9 F (36.6 C) 97.9 F (36.6 C) 97.8 F (36.6 C) 98.1 F (36.7 C)  TempSrc: Oral Axillary Axillary Axillary  SpO2: 96% 100% 95% 97%  Weight:      Height:        Intake/Output Summary (Last 24 hours) at 05/28/2020 1308 Last data filed at 05/28/2020 1302 Gross per 24 hour  Intake 1142.61 ml  Output 700 ml  Net 442.61 ml   Filed Weights   05/18/20 1213 05/23/20 1549  Weight: 71.2 kg 71.2 kg    Examination:  General: Lethargic, appears ill HEENT: Normocephalic, atraumatic Neck, supple, trachea midline, no tenderness Heart: Regular rate and rhythm, S1/S2  normal, no murmurs Lungs: Tachypneic, mild bibasilar crackles.  Mildly increased work of breathing.  2 L Abdomen: Soft, nontender, nondistended, positive bowel sounds Extremities: Normal, atraumatic, no clubbing or cyanosis, normal muscle tone Skin: No rashes or lesions, normal color Neurologic: Cranial nerves grossly intact, sensation intact, alert and oriented x3 Psychiatric: Flattened affect    Data Reviewed: I have personally reviewed following labs and imaging studies  CBC: Recent Labs  Lab 05/22/20 0435 05/24/20 0626 05/27/20 1419 05/28/20 0628  WBC 21.5* 15.6* 10.1 10.6*  NEUTROABS  --   --  7.8* 8.6*  HGB 16.2 15.3 13.7 13.7  HCT 47.6 45.2 42.1 42.1  MCV 85.9 86.9 89.6 89.4  PLT 275 265 230 315   Basic Metabolic Panel: Recent Labs  Lab 05/23/20 0655 05/23/20 0655 05/24/20 0626 05/25/20 0502 05/26/20 0343 05/27/20 1419 05/28/20 0628  NA 141  --  140  --  134* 135 137  K 2.6*   < >  2.6* 2.9* 3.0* 3.7 3.4*  CL 94*  --  96*  --  91* 99 101  CO2 36*  --  31  --  31 27 30   GLUCOSE 71  --  117*  --  246* 82 120*  BUN 21  --  21  --  15 13 15   CREATININE 1.00   < > 1.00 0.84 1.01 0.85 0.89  CALCIUM 7.9*  --  7.4*  --  7.4* 7.4* 7.5*  MG  --   --   --  2.2 2.0  --  1.9   < > = values in this interval not displayed.   GFR: Estimated Creatinine Clearance: 58.5 mL/min (by C-G formula based on SCr of 0.89 mg/dL). Liver Function Tests: No results for input(s): AST, ALT, ALKPHOS, BILITOT, PROT, ALBUMIN in the last 168 hours. No results for input(s): LIPASE, AMYLASE in the last 168 hours. No results for input(s): AMMONIA in the last 168 hours. Coagulation Profile: No results for input(s): INR, PROTIME in the last 168 hours. Cardiac Enzymes: No results for input(s): CKTOTAL, CKMB, CKMBINDEX, TROPONINI in the last 168 hours. BNP (last 3 results) No results for input(s): PROBNP in the last 8760 hours. HbA1C: No results for input(s): HGBA1C in the last 72  hours. CBG: Recent Labs  Lab 05/27/20 1213 05/27/20 1628 05/27/20 2129 05/28/20 0743 05/28/20 1205  GLUCAP 70 85 226* 129* 188*   Lipid Profile: No results for input(s): CHOL, HDL, LDLCALC, TRIG, CHOLHDL, LDLDIRECT in the last 72 hours. Thyroid Function Tests: No results for input(s): TSH, T4TOTAL, FREET4, T3FREE, THYROIDAB in the last 72 hours. Anemia Panel: No results for input(s): VITAMINB12, FOLATE, FERRITIN, TIBC, IRON, RETICCTPCT in the last 72 hours. Sepsis Labs: No results for input(s): PROCALCITON, LATICACIDVEN in the last 168 hours.  Recent Results (from the past 240 hour(s))  Culture, blood (Routine X 2) w Reflex to ID Panel     Status: None   Collection Time: 05/20/20  4:42 PM   Specimen: BLOOD  Result Value Ref Range Status   Specimen Description BLOOD BLOOD LEFT WRIST  Final   Special Requests   Final    BOTTLES DRAWN AEROBIC AND ANAEROBIC Blood Culture adequate volume   Culture   Final    NO GROWTH 5 DAYS Performed at [facility], [address] [place], [place] [address]    Report Status 05/25/2020 FINAL  Final  Culture, blood (Routine X 2) w Reflex to ID Panel     Status: None   Collection Time: 05/20/20  4:46 PM   Specimen: BLOOD  Result Value Ref Range Status   Specimen Description BLOOD LEFT ANTECUBITAL  Final   Special Requests   Final    BOTTLES DRAWN AEROBIC AND ANAEROBIC Blood Culture results may not be optimal due to an excessive volume of blood received in culture bottles   Culture   Final    NO GROWTH 5 DAYS Performed at [facility], [address] [place], [place] [address]    Report Status 05/25/2020 FINAL  Final         Radiology Studies: No results found.      Scheduled Meds: . (feeding supplement) PROSource Plus  30 mL Oral BID BM  . aspirin EC  81 mg Oral Daily  . atorvastatin  10 mg Oral Daily  . clopidogrel  75 mg Oral Daily  . feeding supplement (ENSURE ENLIVE)  237 mL Oral TID BM  . insulin  aspart  0-15  Units Subcutaneous TID WC  . insulin detemir  10 Units Subcutaneous QHS  . mometasone-formoterol  2 puff Inhalation BID   And  . umeclidinium bromide  1 puff Inhalation Daily  . multivitamin with minerals  1 tablet Oral Daily  . nicotine  21 mg Transdermal Daily  . polyethylene glycol  17 g Oral BID  . ramelteon  8 mg Oral QHS  . senna-docusate  1 tablet Oral QHS   Continuous Infusions: . sodium chloride Stopped (05/21/20 1607)  . dextrose 5 % and 0.9% NaCl 50 mL/hr at 05/28/20 1013  . famotidine (PEPCID) IV    . nafcillin (NAFCIL) continuous infusion 12 g (05/28/20 0708)     LOS: 10 days    Time spent: 25 minutes    Sidney Ace, MD [facility]  If 7PM-7AM, please contact night-coverage 05/28/2020, 1:08 PM

## 2020-05-28 NOTE — Progress Notes (Signed)
[facility] Liaison note:  New referral for [facility] hospice services at home received from [facility]. Patient information sent to referral. Hospice eligibility confirmed.  Writer spoke with patient's family and his care giver to initiate education regarding hospice services, philosophy and team approach to care with understanding voiced. DME needs discussed and ordered for delivery tomorrow. Plan at this time is for discharge home when DME is in place. Patient will need non emergent transport with signed out of facility DNR in place. Hospital care team updated.  Will continue to follow through discharge. Thank you for the opportunity to be involved in the care of this patient and his family.  Flo Shanks BSN, RN, [place] [phone]

## 2020-05-28 NOTE — Progress Notes (Signed)
OT Cancellation Note  Patient Details Name: Jonathan [name] MRN: [id number] DOB: 04/20/1941   Cancelled Treatment:    Reason Eval/Treat Not Completed: Fatigue/lethargy limiting ability to participate;Patient declined, no reason specified. Chart reviewed, RN cleared pt to participate with therapy. Upon attempt, pt sleeping, nasal cannula displaced. Pt briefly wakes to verbal cues and allows OT to reposition nasal cannula and adjust pillow for improved positioning/comfort. O2 98%, no visible SOB noted. Pt declines EOB attempts and bed level tasks. Does not keep eyes open. Will re-attempt OT tx at later date/time as pt is able to participate and is medically appropriate.   Jeni Salles, MPH, MS, OTR/L ascom [phone] 05/28/20, 10:49 AM

## 2020-05-28 NOTE — Care Management (Signed)
Discussed clinical status and plan of care at length with patient, wife, and daughter.  Patient is alert this morning, and again expresses a desire to just go home.  He is alert and aware that his clinical situation will likely deteriorate.  The patient's wife and daughter also express a desire not to have any aggressive treatment measures or invasive procedures.  I have notified TOC and bedside RN.  Will proceed with hospice referral.  TOC to reach out to patient's family and hospice representative.  Continue current scope of treatment at this time.  Avoid blood draws if possible  Ralene Muskrat MD

## 2020-05-28 NOTE — Progress Notes (Signed)
Palliative-  Chart reviewed thoroughly. Noted discussion with MD and patient's family.Called and spoke with patient's spouse and daughter.  Discussed the difference between Hospice and Palliative care.  Patient's spouse has concerns that Hospice with overdose him with opoid medication.  Discussed the intent of medication at end of life is to provide symptom management of symptoms that occur with dying and suffering. If patient is at home with hospice- family will be in charge of giving medication.  They have a caregiver in the home- but there are concerns about the level of care that patient will need at home and how much care the caregiver can provide.  Family to discuss and call back with questions and or decisions.   The above conversation was completed via telephone due to the restrictions during the COVID-19 pandemic. Thorough chart review and discussion with necessary members of the care team was completed as part of assessment. All issues were discussed and addressed but no physical exam was performed.  Mariana Kaufman, AGNP-C Palliative Medicine  Please call Palliative Medicine team phone with any questions [phone]. For individual providers please see AMION.  Total time: 23 minutes

## 2020-05-28 NOTE — Care Management Important Message (Signed)
Important Message  Patient Details  Name: FARAAZ WOLIN MRN: [id number] Date of Birth: [birth year]   Medicare Important Message Given:  Yes     Dannette Barbara 05/28/2020, 1:28 PM

## 2020-05-29 ENCOUNTER — Telehealth: Payer: Self-pay

## 2020-05-29 LAB — GLUCOSE, CAPILLARY
Glucose-Capillary: 200 mg/dL — ABNORMAL HIGH (ref 70–99)
Glucose-Capillary: 178 mg/dL — ABNORMAL HIGH (ref 70–99)
Glucose-Capillary: 152 mg/dL — ABNORMAL HIGH (ref 70–99)
Glucose-Capillary: 178 mg/dL — ABNORMAL HIGH (ref 70–99)

## 2020-05-29 MED ORDER — MORPHINE SULFATE (CONCENTRATE) 10 MG/0.5ML PO SOLN: 5.0000 mg | ORAL | 0 refills | Status: AC | PRN

## 2020-05-29 MED ORDER — LIDOCAINE 5 % EX PTCH
1.0000 | MEDICATED_PATCH | CUTANEOUS | Status: DC
  Administered 2020-05-29: 1 via TRANSDERMAL
  Filled 2020-05-29 (×2): qty 1

## 2020-05-29 MED ORDER — MORPHINE SULFATE (CONCENTRATE) 10 MG/0.5ML PO SOLN
5.0000 mg | ORAL | Status: DC | PRN
  Administered 2020-05-29: 10 mg via ORAL
  Filled 2020-05-29: qty 0.5

## 2020-05-29 NOTE — Telephone Encounter (Signed)
Patient is discharging home today with Hospice (dx Covid19, COPD exacerbation, failure to thrive, bacteremia, lung mass).  [facility] nurse, Alwyn Ren, is calling today to ask for Attending status on patient.   Will forward to Dr. Glori Bickers for attending approval.

## 2020-05-29 NOTE — TOC Transition Note (Signed)
Transition of Care [facility]) - CM/SW Discharge Note   Patient Details  Name: Jonathan [name] MRN: [id number] Date of Birth: [birth year]  [facility] CM/SW Contact:  Beverly Sessions, RN Phone Number: 05/29/2020, 1:51 PM   Clinical Narrative:     Patient to discharge home with hospice services today.  Santiago Glad with [employer] has confirmed that hospital bed has been delivered.  EMS packet printed Signed DNR on chart EMS called  Final next level of care: Home w Hospice Care Barriers to Discharge: No Barriers Identified   Patient Goals and CMS Choice     Choice offered to / list presented to : Spouse  Discharge Placement                       Discharge Plan and Services     Post Acute Care Choice: [place]                               Social Determinants of Health (SDOH) Interventions     Readmission Risk Interventions No flowsheet data found.

## 2020-05-29 NOTE — Telephone Encounter (Signed)
That is fine- please verbally ok that order and I can attend

## 2020-05-29 NOTE — Discharge Summary (Signed)
Physician Discharge Summary  Jonathan [name] KVQ:[id number] DOB: May 03, 1941 DOA: 05/18/2020  PCP: Abner Greenspan, MD  Admit date: 05/18/2020 Discharge date: 05/29/2020  Admitted From: Home Disposition: Home with hospice  Recommendations for Outpatient Follow-up:  1. Per hospice staff  Home Health: No Equipment/Devices: None Discharge Condition: Hospice CODE STATUS: DNR Diet recommendation:  Dysphagia   Brief/Interim Summary: 79 year old male who was recently discharged from the hospital after treatment for COVID-19 viral infection. Presented to ED for evaluation of worsening shortness of breath, nonproductive cough and wheezing and was found to be hypoxic with room air pulse oximetry at rest of 86%. Admitted for COPD exacerbation in the context of recent Covid infection. COPD exacerbation resolved overnight. Was found to have MSSA bacteremia which has become the dominant reason for ongoing hospitalization. Initially refused TEE 8/23, but now agreeable to TEE 8/25. Source of bacteremia unclear, could be phenomenon related to Covid. Developed spontaneous left ring finger erythema distally of unclear significance. Orthopedics following. Seen by orthopedics, infectious disease and vascular surgery. Plan for angiogram 8/25 to exclude vascular pathology.   Admit 8/13 to 8/16 for COVID tx w/ remdesivir, steroids. Discharged on steroid taper and compelte outpt remdesivir.   8/13 ED, hypoxic, admitted for COVID  8/12 ED, COVID+, received MAB and was d/c from ED  8/25: Patient seen and examined. Little bit irritated this morning about being n.p.o. Communicated with cardiology this morning. Patient was scheduled for TEE however specials refused to take patient down as he is currently on airborne isolation. Spoke with infection prevention on the floor. Patient's last test was 05/10/2020. He is asymptomatic from a Covid standpoint. No oxygen requirement, no evidence of pneumonia on CT, no  fevers. Okay to DC airborne isolation  Communicated with vascular surgery. Plans for angiography of left ring finger today.   8/26: Patient seen and examined.  Irritable this morning.  Apparently his TEE was canceled overnight for reasons that are unclear.  Apparently given concerns for Covid status which was known by all involved providers.  Notified cardiology this morning.  Case has been rescheduled for 05/25/2020  8/27: Patient seen and examined.  More lethargic this morning.  Less irritable.  TEE once again canceled due to numerous issues including reinstatement of Covid precautions per ID, oxygen requirements of 2 L with baseline COPD, persistent hypokalemia, availability of special procedure staff.  Patient was seen by palliative care.  Recommendations appreciated.  Patient DNR status.    8/28: Patient seen and examined.  We can sleepy this morning.  TEE on hold through the weekend.  Seen by palliative care yesterday.  Reach out to family.  Considering moving towards comfort palliative route should patient's functional status not improve.  Made on 2 L.  Does not appear in any distress.  8/29: Patient seen and examined.  Remains very lethargic.  Seemingly uninterested in food.  Upon awakening does not really participate in interview.  Remains on 2 L.  Hemodynamically stable but blood pressure trending down.  Blood glucose also trending down due to poor p.o. intake.  8/30: Patient seen and examined.  He is more awake on my evaluation this morning.  Remains ill-appearing.  Vital signs stable.  Mildly tachypneic.  2 L nasal cannula.  I had a lengthy conversation with the patient today.  He simply stated desire to go home.  I explained the ramifications of this.  I related that I spoke to his wife and daughter about this yesterday.  If the patient is to  go home he should go home under hospice care.  Patient understands that he would likely clinically deteriorate once he returns back home but his  focus on quality of life.  8/31: Patient seen on the day of discharge.  Physical exam deferred.  Patient appears weak and lethargic.  Mild distress secondary to pain.  Treated with as needed liquid morphine.  Discussed with hospice liaison Santiago Glad.  Plan to discharge to home with hospice services.  Pending delivery of DME to the patient's house.  Discharge Diagnoses:  Principal Problem:   Bacteremia due to Staphylococcus aureus Active Problems:   Tobacco abuse   Physical deconditioning   Type 2 diabetes mellitus with hyperglycemia, with long-term current use of insulin (HCC)   COVID-19 virus infection   COPD with acute exacerbation (HCC)   Closed rib fracture   Lung mass   Aortic atherosclerosis (HCC)   Emphysema lung (HCC)   Finger pain, left   COPD exacerbation (HCC)   Frailty   Goals of care, counseling/discussion   Advanced care planning/counseling discussion   Palliative care by specialist  Adult failure to thrive Patient has had a complicated hospital course with MSSA bacteremia of unclear etiology, COPD exacerbation, history of recent Covid infection.  I suspect that his decline is likely multifactorial in nature.  Documentation it appears that his decline has been progressive over the past several weeks to months.  P.o. intake has decreased, his ability to ambulate has decreased.  Prognosis highly guarded at this point.  I seriously doubt that his functional status will improve enough to make TEE viable option.  8/30: After lengthy discussions with the patient's wife, daughter and the patient himself in regards to overall clinical decline.  The patient has no desire for any further invasive procedures or interventions.  He simply wants to return home.  He understands that home under hospice care would likely be his only option to return home at this time.  This is likely the route that we are pursuing.  TOC and hospice liaison are aware and will reach out to patient's family as well  as caregivers to determine the next appropriate step.  In the meantime we will avoid any further invasive procedures.  Minimize IV sticks.  Continue antibiotics for now until formal disposition plan is made  8/31: Prescription for liquid morphine sent to patient's outpatient pharmacy.    Bacteremia due to Staphylococcus aureus --remains afebrile, vital signs stable. --sepsis ruled out --Sourceremains unclear. Remote neck surgery. No other hardware. No drug use. Skin appears unremarkable except for distal left ring finger, which has circumferential erythema.Tender but no abscess or lesions. Xrayprobablynonacute.  Patient was on Ancef during her hospital stay.  Surveillance culture showed no growth.  TEE was initially planned however deferred given patient's functional status.  No antibiotics on discharge given comfort measures/hospice  COVID-19 virus infection --Status post treatment last hospitalization with remdesivir and steroids. Appears asymptomatic at this time. Also received monoclonal antibody infusion 8/12. --Asymptomatic. --Positive test 05/10/2020. Communicated with infection prevention. DC airborne isolation precautions Patient is out of 10-day window required for isolation.  Covid isolation initially discontinued and was restarted by infectious disease.  I reached out to infection prevention/ID Dr. Baxter Flattery on 05/27/2020.  She reviewed the case and determined appropriate for lifting of isolation precautions.  Closed rib fracture --Minimally displaced right anterolateral seventh rib fracture, with small surrounding hematoma. No evidence of hemothorax or pneumothorax.Monitor clinically. --secondary to fall at home  Lung mass --1.4 cm spiculated right  lower lobe mass, highly concerning for bronchogenic malignancy. PET-CT is recommended for further Evaluation. --f/u as outpt--I discussed w/ wife and daughter by telephone 8/22  Aortic atherosclerosis ([place]) --continue  statin  Emphysema lung ([place]) --as per COPD  Discharge Instructions  Discharge Instructions    Diet - low sodium heart healthy   Complete by: As directed    Increase activity slowly   Complete by: As directed      Allergies as of 05/29/2020      Reactions   Amoxicillin-pot Clavulanate Other (See Comments)   Severe stomach pain.    Quinapril Hcl Other (See Comments)   back pain   Valsartan Other (See Comments)   back pain   Varenicline Tartrate Other (See Comments)   AMS, hallucinations, night mares      Medication List    STOP taking these medications   aspirin 81 MG tablet   atorvastatin 10 MG tablet Commonly known as: LIPITOR   predniSONE 10 MG tablet Commonly known as: DELTASONE     TAKE these medications   albuterol (2.5 MG/3ML) 0.083% nebulizer solution Commonly known as: PROVENTIL Take 2.5 mg by nebulization every 4 (four) hours as needed for wheezing or shortness of breath.   albuterol 108 (90 Base) MCG/ACT inhaler Commonly known as: Ventolin HFA Inhale 2 puffs into the lungs every 6 (six) hours as needed for wheezing.   Breo Ellipta 100-25 MCG/INH Aepb Generic drug: fluticasone furoate-vilanterol Inhale 1 puff into the lungs daily.   Breztri Aerosphere 160-9-4.8 MCG/ACT Aero Generic drug: Budeson-Glycopyrrol-Formoterol Inhale 2 puffs into the lungs 2 (two) times daily.   HumaLOG KwikPen 100 UNIT/ML KwikPen Generic drug: insulin lispro INJECT 8-14 UNITS INTO THE SKIN 3 TIMES DAILY. What changed: See the new instructions.   insulin glargine 100 UNIT/ML Solostar Pen Commonly known as: LANTUS Inject 42 Units into the skin at bedtime.   metFORMIN 1000 MG tablet Commonly known as: GLUCOPHAGE TAKE ONE TABLET BY MOUTH  TWICE DAILY What changed:   how much to take  how to take this  when to take this  additional instructions   morphine CONCENTRATE 10 MG/0.5ML Soln concentrated solution Take 0.25-0.5 mLs (5-10 mg total) by mouth every 2 (two)  hours as needed for moderate pain, severe pain or shortness of breath.       Follow-up Information    Dew, Erskine Squibb, MD In 1 month.   Specialties: Vascular Surgery, Radiology, Interventional Cardiology Why: left a message for them to call pt back Contact information: [place] [address] [phone]              Allergies  Allergen Reactions  . Amoxicillin-Pot Clavulanate Other (See Comments)    Severe stomach pain.   . Quinapril Hcl Other (See Comments)    back pain  . Valsartan Other (See Comments)    back pain  . Varenicline Tartrate Other (See Comments)    AMS, hallucinations, night mares    Consultations: Palliative care Vascular surgery Orthopedics Infectious disease  Procedures/Studies: DG Chest 1 View  Result Date: 05/20/2020 CLINICAL DATA:  Airway aspiration EXAM: CHEST  1 VIEW COMPARISON:  Two days ago FINDINGS: Interstitial coarsening that is unchanged. Emphysema and hyperinflation. Nodular density at the right base which correlates with a rib fracture by CT. Additional pulmonary nodule in the right lower lobe on recent chest CT. Normal heart size when allowing for rotation. IMPRESSION: COPD without acute superimposed finding, reference chest CT from 2 days ago. Electronically Signed  By: Monte Fantasia M.D.   On: 05/20/2020 07:38   CT ANGIO CHEST PE W OR WO CONTRAST  Result Date: 05/18/2020 CLINICAL DATA:  Increasing shortness of breath and weakness, COVID-19 diagnosis 5 days ago EXAM: CT ANGIOGRAPHY CHEST WITH CONTRAST TECHNIQUE: Multidetector CT imaging of the chest was performed using the standard protocol during bolus administration of intravenous contrast. Multiplanar CT image reconstructions and MIPs were obtained to evaluate the vascular anatomy. CONTRAST:  4mL OMNIPAQUE IOHEXOL 350 MG/ML SOLN COMPARISON:  05/18/2020, 12/10/2017 FINDINGS: Cardiovascular: This is a technically adequate evaluation of the pulmonary vasculature. No  filling defects or pulmonary emboli. The heart is not enlarged. No pericardial effusion. Extensive atherosclerosis throughout the coronary vasculature unchanged. Normal caliber of the thoracic aorta, with minimal atherosclerosis of the aortic arch. Mediastinum/Nodes: No enlarged mediastinal, hilar, or axillary lymph nodes. Thyroid gland, trachea, and esophagus demonstrate no significant findings. Lungs/Pleura: There is severe background emphysema, upper lobe predominant. A spiculated nodule within the superior segment of the right lower lobe measures 1.4 by 1.0 by 1.3 cm, highly concerning for pulmonary malignancy. PET-CT is recommended for further evaluation. There is a 0.6 cm nodule within the lateral basilar segment of the right lower lobe reference image 74/6, stable since prior exam and benign. No other areas of consolidation, effusion, or pneumothorax. Prominent bronchial wall thickening within the left lower lobe consistent with bronchitis. Otherwise the central airways are patent. Upper Abdomen: Nonspecific nodular thickening of the bilateral adrenal glands, measuring up to 11 mm on the right and 11 mm on the left, slightly more pronounced than prior study. Statistically this stool likely reflects hyperplasia or adenoma, though could be evaluated at the time of follow-up PET CT. High attenuation material within the gallbladder consistent with noncalcified stones or sludge. No evidence of cholecystitis. Musculoskeletal: There is a minimally displaced right anterolateral seventh rib fracture. There is a small amount of subcutaneous gas within the adjacent soft tissues, as well as a small hematoma surrounding the fracture line and extending into the pleural space, measuring up to 2 cm in thickness. No evidence of hemothorax or pneumothorax. No other acute bony abnormalities. Reconstructed images demonstrate no additional findings. Review of the MIP images confirms the above findings. IMPRESSION: 1. 1.4 cm  spiculated right lower lobe mass, highly concerning for bronchogenic malignancy. PET-CT is recommended for further evaluation. 2. No evidence of pulmonary embolus. 3. Minimally displaced right anterolateral seventh rib fracture, with small surrounding hematoma. No evidence of hemothorax or pneumothorax. 4. Gallbladder sludge, no evidence of cholecystitis. 5. Nonspecific nodular thickening of the adrenal glands. This could be evaluated at the time of follow-up PET CT. 6. Aortic Atherosclerosis (ICD10-I70.0) and Emphysema (ICD10-J43.9). Electronically Signed   By: Randa Ngo M.D.   On: 05/18/2020 16:40   PERIPHERAL VASCULAR CATHETERIZATION  Result Date: 05/23/2020 See op note  DG Chest Port 1 View  Result Date: 05/24/2020 CLINICAL DATA:  Hypoxia, COVID EXAM: PORTABLE CHEST 1 VIEW COMPARISON:  05/20/2020.  CT 05/18/2020 FINDINGS: There is hyperinflation of the lungs compatible with COPD. Heart is normal size. Interstitial thickening again noted, unchanged. No effusions or pneumothorax. Pleural based density noted peripherally at the right lung base, shown to be hematoma surrounding and anterior right 7th rib fracture on prior CT. IMPRESSION: COPD.  Stable interstitial prominence without confluent opacity. Electronically Signed   By: Rolm Baptise M.D.   On: 05/24/2020 15:10   DG Chest Port 1 View  Result Date: 05/18/2020 CLINICAL DATA:  Shortness of breath EXAM: PORTABLE  CHEST 1 VIEW COMPARISON:  May 11, 2020 FINDINGS: There is mild interstitial thickening bilaterally. No edema or airspace opacity. Heart size and pulmonary vascularity are normal. No adenopathy. There is aortic atherosclerosis. No bone lesions IMPRESSION: Mild interstitial thickening bilaterally, probably indicative of a degree of chronic bronchitis. No edema or airspace opacity. Heart size within normal limits. Aortic Atherosclerosis (ICD10-I70.0). Electronically Signed   By: Lowella Grip III M.D.   On: 05/18/2020 12:34   DG  Chest Portable 1 View  Result Date: 05/11/2020 CLINICAL DATA:  79 year old male positive COVID-19.  Cough. EXAM: PORTABLE CHEST 1 VIEW COMPARISON:  Portable chest 05/10/2020 and earlier. FINDINGS: Portable AP semi upright view at 0312 hours. Stable lung volumes and mediastinal contours. Asymmetric attenuation of left upper lobe bronchovascular markings in keeping with emphysema. No superimposed pneumothorax, pulmonary edema, pleural effusion, or confluent pulmonary opacity. No acute osseous abnormality identified. IMPRESSION: Emphysema (ICD10-J43.9).  No acute cardiopulmonary abnormality. Electronically Signed   By: Genevie Ann M.D.   On: 05/11/2020 03:32   Portable chest 1 View  Result Date: 05/10/2020 CLINICAL DATA:  Productive cough for several days. EXAM: PORTABLE CHEST 1 VIEW COMPARISON:  12/09/2017 FINDINGS: 0956 hours. Low volumes. Cardiopericardial silhouette is at upper limits of normal for size. The lungs are clear without focal pneumonia, edema, pneumothorax or pleural effusion. The visualized bony structures of the thorax show now acute abnormality. IMPRESSION: Low volume film without acute cardiopulmonary findings. Electronically Signed   By: Misty Stanley M.D.   On: 05/10/2020 10:18   DG Shoulder Left Port  Result Date: 05/19/2020 CLINICAL DATA:  Left shoulder pain, unable to provide history. EXAM: LEFT SHOULDER COMPARISON:  Chest CT 12/10/2017 FINDINGS: The osseous structures appear diffusely demineralized which may limit detection of small or nondisplaced fractures. Corticated fragment along the anteroinferior glenoid could reflect sequela of prior glenoid labral injury or intra-articular bodies. Background of mild glenohumeral arthrosis. High-riding appearance of the humeral head moderate acromioclavicular arthrosis is present as well. Acromioclavicular alignment is maintained albeit with some focal soft tissue thickening superficial to the acromioclavicular joint. Recommend correlation for  point tenderness to assess for a [place] type 1 acromioclavicular injury. Remaining soft tissues are unremarkable. IMPRESSION: 1. Focal soft tissue thickening superficial to the acromioclavicular joint. Recommend correlation for point tenderness for the exclusion of a [place] type 1 acromioclavicular injury. 2. Corticated fragment along the anteroinferior glenoid could reflect sequela of prior glenoid labral injury or intra-articular bodies. 3. Mild to moderate glenohumeral and acromioclavicular arthrosis. 4. High-riding humeral head could reflect chronic underlying rotator cuff tendinopathy given presence on comparison. Electronically Signed   By: Lovena Le M.D.   On: 05/19/2020 19:48   DG Finger Ring Left  Result Date: 05/20/2020 CLINICAL DATA:  Left ring finger pain, swelling, and redness. EXAM: LEFT RING FINGER 2+V COMPARISON:  None. FINDINGS: No soft tissue gas identified. A soft tissue calcification adjacent to the proximal aspect of the distal third phalanx is likely from trauma. The soft tissue calcifications well corticated and I suspect this is nonacute. No other bony abnormalities. IMPRESSION: A soft tissue calcification adjacent to the proximal aspect of the distal third phalanx is likely posttraumatic. While the finding is age indeterminate, I suspected is nonacute. No other abnormalities. Electronically Signed   By: Dorise Bullion III M.D   On: 05/20/2020 15:41   ECHOCARDIOGRAM COMPLETE  Result Date: 05/19/2020    ECHOCARDIOGRAM REPORT   Patient Name:   UGONNA KEIRSEY Date of Exam: 05/19/2020 Medical Rec #:  [id number]       Height:       65.0 in Accession #:    [id number]      Weight:       157.0 lb Date of Birth:  1940/12/14       BSA:          1.785 m Patient Age:    70 years        BP:           190/75 mmHg Patient Gender: M               HR:           56 bpm. Exam Location:  [facility] Procedure: 2D Echo, Cardiac Doppler and Color Doppler Indications:     CHF- acute diastolic 378.58   History:         Patient has no prior history of Echocardiogram examinations.                  COPD; Risk Factors:Hypertension and Dyslipidemia. Tobacco                  abuse.  Sonographer:     Sherrie Sport RDCS (AE) Referring Phys:  [place] Diagnosing Phys: Kathlyn Sacramento MD  Sonographer Comments: Technically difficult study due to poor echo windows, no apical window and no parasternal window. IMPRESSIONS  1. Left ventricular ejection fraction, by estimation, is 55 to 60%. The left ventricle has normal function. Left ventricular endocardial border not optimally defined to evaluate regional wall motion. There is mild left ventricular hypertrophy. Left ventricular diastolic function could not be evaluated.  2. Right ventricular systolic function is normal. The right ventricular size is normal. Tricuspid regurgitation signal is inadequate for assessing PA pressure.  3. Left atrial size was mildly dilated.  4. The mitral valve is normal in structure. No evidence of mitral valve regurgitation. No evidence of mitral stenosis.  5. The aortic valve is normal in structure. Aortic valve regurgitation is not visualized. No aortic stenosis is present.  6. challenging image quality with limited views. FINDINGS  Left Ventricle: Left ventricular ejection fraction, by estimation, is 55 to 60%. The left ventricle has normal function. Left ventricular endocardial border not optimally defined to evaluate regional wall motion. The left ventricular internal cavity size was normal in size. There is mild left ventricular hypertrophy. Left ventricular diastolic function could not be evaluated. Right Ventricle: The right ventricular size is normal. No increase in right ventricular wall thickness. Right ventricular systolic function is normal. Tricuspid regurgitation signal is inadequate for assessing PA pressure. Left Atrium: Left atrial size was mildly dilated. Right Atrium: Right atrial size was normal in size. Pericardium:  There is no evidence of pericardial effusion. Mitral Valve: The mitral valve is normal in structure. Normal mobility of the mitral valve leaflets. No evidence of mitral valve regurgitation. No evidence of mitral valve stenosis. Tricuspid Valve: The tricuspid valve is normal in structure. Tricuspid valve regurgitation is not demonstrated. No evidence of tricuspid stenosis. Aortic Valve: The aortic valve is normal in structure. Aortic valve regurgitation is not visualized. No aortic stenosis is present. Pulmonic Valve: The pulmonic valve was normal in structure. Pulmonic valve regurgitation is not visualized. No evidence of pulmonic stenosis. Aorta: The aortic root is normal in size and structure. Venous: The inferior vena cava was not well visualized. IAS/Shunts: No atrial level shunt detected by color flow Doppler.  LEFT VENTRICLE PLAX 2D LVIDd:  3.85 cm LVIDs:         2.76 cm LV PW:         1.23 cm LV IVS:        1.23 cm  LEFT ATRIUM         Index LA diam:    4.30 cm 2.41 cm/m                        PULMONIC VALVE AORTA                 PV Vmax:        0.69 m/s Ao Root diam: 2.50 cm PV Peak grad:   1.9 mmHg                       RVOT Peak grad: 6 mmHg  Kathlyn Sacramento MD Electronically signed by Kathlyn Sacramento MD Signature Date/Time: 05/19/2020/11:22:42 AM    Final     (Echo, Carotid, EGD, Colonoscopy, ERCP)    Subjective: Seen and examined on day of discharge.  Can discharge home with hospice services  Discharge Exam: Vitals:   05/29/20 0943 05/29/20 1242  BP: (!) 107/53 (!) 122/42  Pulse: 75 72  Resp:  (!) 24  Temp:  98.6 F (37 C)  SpO2:  94%   Vitals:   05/29/20 0559 05/29/20 0755 05/29/20 0943 05/29/20 1242  BP: (!) 107/50 (!) 106/38 (!) 107/53 (!) 122/42  Pulse: 72 68 75 72  Resp: 20 20  (!) 24  Temp: 98 F (36.7 C) 98.5 F (36.9 C)  98.6 F (37 C)  TempSrc: Oral Oral  Oral  SpO2: 99% 96%  94%  Weight:      Height:        General: Pt is alert, awake, not in acute  distress Cardiovascular: RRR, S1/S2 +, no rubs, no gallops Respiratory: CTA bilaterally, no wheezing, no rhonchi Abdominal: Soft, NT, ND, bowel sounds + Extremities: no edema, no cyanosis    The results of significant diagnostics from this hospitalization (including imaging, microbiology, ancillary and laboratory) are listed below for reference.     Microbiology: Recent Results (from the past 240 hour(s))  Culture, blood (Routine X 2) w Reflex to ID Panel     Status: None   Collection Time: 05/20/20  4:42 PM   Specimen: BLOOD  Result Value Ref Range Status   Specimen Description BLOOD BLOOD LEFT WRIST  Final   Special Requests   Final    BOTTLES DRAWN AEROBIC AND ANAEROBIC Blood Culture adequate volume   Culture   Final    NO GROWTH 5 DAYS Performed at [facility], [address] [place], [place] [address]    Report Status 05/25/2020 FINAL  Final  Culture, blood (Routine X 2) w Reflex to ID Panel     Status: None   Collection Time: 05/20/20  4:46 PM   Specimen: BLOOD  Result Value Ref Range Status   Specimen Description BLOOD LEFT ANTECUBITAL  Final   Special Requests   Final    BOTTLES DRAWN AEROBIC AND ANAEROBIC Blood Culture results may not be optimal due to an excessive volume of blood received in culture bottles   Culture   Final    NO GROWTH 5 DAYS Performed at [facility], [address], [place], [place] [address]    Report Status 05/25/2020 FINAL  Final     Labs: BNP (last 3 results) No results for  input(s): BNP in the last 8760 hours. Basic Metabolic Panel: Recent Labs  Lab 05/23/20 0655 05/23/20 0655 05/24/20 0626 05/25/20 0502 05/26/20 0343 05/27/20 1419 05/28/20 0628  NA 141  --  140  --  134* 135 137  K 2.6*   < > 2.6* 2.9* 3.0* 3.7 3.4*  CL 94*  --  96*  --  91* 99 101  CO2 36*  --  31  --  31 27 30   GLUCOSE 71  --  117*  --  246* 82 120*  BUN 21  --  21  --  15 13 15   CREATININE 1.00   < > 1.00 0.84 1.01 0.85 0.89   CALCIUM 7.9*  --  7.4*  --  7.4* 7.4* 7.5*  MG  --   --   --  2.2 2.0  --  1.9   < > = values in this interval not displayed.   Liver Function Tests: No results for input(s): AST, ALT, ALKPHOS, BILITOT, PROT, ALBUMIN in the last 168 hours. No results for input(s): LIPASE, AMYLASE in the last 168 hours. No results for input(s): AMMONIA in the last 168 hours. CBC: Recent Labs  Lab 05/24/20 0626 05/27/20 1419 05/28/20 0628  WBC 15.6* 10.1 10.6*  NEUTROABS  --  7.8* 8.6*  HGB 15.3 13.7 13.7  HCT 45.2 42.1 42.1  MCV 86.9 89.6 89.4  PLT 265 230 241   Cardiac Enzymes: No results for input(s): CKTOTAL, CKMB, CKMBINDEX, TROPONINI in the last 168 hours. BNP: Invalid input(s): POCBNP CBG: Recent Labs  Lab 05/28/20 1205 05/28/20 1635 05/28/20 2118 05/29/20 0746 05/29/20 1240  GLUCAP 188* 170* 239* 200* 178*   D-Dimer No results for input(s): DDIMER in the last 72 hours. Hgb A1c No results for input(s): HGBA1C in the last 72 hours. Lipid Profile No results for input(s): CHOL, HDL, LDLCALC, TRIG, CHOLHDL, LDLDIRECT in the last 72 hours. Thyroid function studies No results for input(s): TSH, T4TOTAL, T3FREE, THYROIDAB in the last 72 hours.  Invalid input(s): FREET3 Anemia work up No results for input(s): VITAMINB12, FOLATE, FERRITIN, TIBC, IRON, RETICCTPCT in the last 72 hours. Urinalysis    Component Value Date/Time   COLORURINE YELLOW 05/11/2020 0930   APPEARANCEUR CLEAR 05/11/2020 0930   LABSPEC 1.024 05/11/2020 0930   PHURINE 5.0 05/11/2020 0930   GLUCOSEU 50 (A) 05/11/2020 0930   HGBUR MODERATE (A) 05/11/2020 0930   HGBUR large 08/30/2007 1447   BILIRUBINUR NEGATIVE 05/11/2020 0930   BILIRUBINUR neg. 02/21/2014 1503   KETONESUR 20 (A) 05/11/2020 0930   PROTEINUR 100 (A) 05/11/2020 0930   UROBILINOGEN 0.2 02/21/2014 1503   UROBILINOGEN 1.0 12/14/2013 1652   NITRITE NEGATIVE 05/11/2020 0930   LEUKOCYTESUR NEGATIVE 05/11/2020 0930   Sepsis Labs Invalid  input(s): PROCALCITONIN,  WBC,  LACTICIDVEN Microbiology Recent Results (from the past 240 hour(s))  Culture, blood (Routine X 2) w Reflex to ID Panel     Status: None   Collection Time: 05/20/20  4:42 PM   Specimen: BLOOD  Result Value Ref Range Status   Specimen Description BLOOD BLOOD LEFT WRIST  Final   Special Requests   Final    BOTTLES DRAWN AEROBIC AND ANAEROBIC Blood Culture adequate volume   Culture   Final    NO GROWTH 5 DAYS Performed at [facility], [address], [place], [place] [address]    Report Status 05/25/2020 FINAL  Final  Culture, blood (Routine X 2) w Reflex to ID Panel  Status: None   Collection Time: 05/20/20  4:46 PM   Specimen: BLOOD  Result Value Ref Range Status   Specimen Description BLOOD LEFT ANTECUBITAL  Final   Special Requests   Final    BOTTLES DRAWN AEROBIC AND ANAEROBIC Blood Culture results may not be optimal due to an excessive volume of blood received in culture bottles   Culture   Final    NO GROWTH 5 DAYS Performed at [facility], [address], [place], [place] [address]    Report Status 05/25/2020 FINAL  Final     Time coordinating discharge: Over 30 minutes  SIGNED:   Sidney Ace, MD  [facility] 05/29/2020, 1:20 PM Pager   If 7PM-7AM, please contact night-coverage

## 2020-05-29 NOTE — Telephone Encounter (Signed)
Alwyn Ren notified of Dr. Marliss Coots comments

## 2020-06-11 ENCOUNTER — Other Ambulatory Visit: Payer: Self-pay | Admitting: Family Medicine

## 2020-06-25 ENCOUNTER — Ambulatory Visit: Payer: Medicare Other | Admitting: Primary Care

## 2020-06-29 DEATH — deceased

## 2020-08-16 ENCOUNTER — Ambulatory Visit: Payer: Medicare Other | Admitting: Internal Medicine
